# Patient Record
Sex: Male | Born: 1965
Health system: Southern US, Community
[De-identification: ages and names within clinical notes are randomized; demographics above are authoritative.]

## PROBLEM LIST (undated history)

## (undated) DIAGNOSIS — E119 Type 2 diabetes mellitus without complications: Secondary | ICD-10-CM

## (undated) DIAGNOSIS — F32A Depression, unspecified: Secondary | ICD-10-CM

## (undated) DIAGNOSIS — F319 Bipolar disorder, unspecified: Secondary | ICD-10-CM

## (undated) DIAGNOSIS — I509 Heart failure, unspecified: Secondary | ICD-10-CM

## (undated) DIAGNOSIS — I428 Other cardiomyopathies: Secondary | ICD-10-CM

## (undated) DIAGNOSIS — I10 Essential (primary) hypertension: Secondary | ICD-10-CM

## (undated) DIAGNOSIS — M1990 Unspecified osteoarthritis, unspecified site: Secondary | ICD-10-CM

## (undated) DIAGNOSIS — I219 Acute myocardial infarction, unspecified: Secondary | ICD-10-CM

## (undated) DIAGNOSIS — C801 Malignant (primary) neoplasm, unspecified: Secondary | ICD-10-CM

## (undated) HISTORY — PX: CHOLECYSTECTOMY: SHX55

## (undated) HISTORY — PX: HERNIA REPAIR: SHX51

---

## 2004-01-27 ENCOUNTER — Emergency Department (HOSPITAL_COMMUNITY): Admission: EM | Admit: 2004-01-27 | Discharge: 2004-01-27 | Payer: Self-pay | Admitting: Emergency Medicine

## 2004-04-11 ENCOUNTER — Inpatient Hospital Stay (HOSPITAL_COMMUNITY): Admission: EM | Admit: 2004-04-11 | Discharge: 2004-04-16 | Payer: Self-pay | Admitting: Psychiatry

## 2005-11-16 ENCOUNTER — Emergency Department (HOSPITAL_COMMUNITY): Admission: EM | Admit: 2005-11-16 | Discharge: 2005-11-17 | Payer: Self-pay | Admitting: Emergency Medicine

## 2006-06-26 ENCOUNTER — Emergency Department (HOSPITAL_COMMUNITY): Admission: EM | Admit: 2006-06-26 | Discharge: 2006-06-26 | Payer: Self-pay | Admitting: Emergency Medicine

## 2006-07-14 ENCOUNTER — Emergency Department (HOSPITAL_COMMUNITY): Admission: EM | Admit: 2006-07-14 | Discharge: 2006-07-15 | Payer: Self-pay | Admitting: Emergency Medicine

## 2006-07-20 ENCOUNTER — Emergency Department (HOSPITAL_COMMUNITY): Admission: EM | Admit: 2006-07-20 | Discharge: 2006-07-20 | Payer: Self-pay | Admitting: Emergency Medicine

## 2006-07-26 ENCOUNTER — Emergency Department (HOSPITAL_COMMUNITY): Admission: EM | Admit: 2006-07-26 | Discharge: 2006-07-26 | Payer: Self-pay | Admitting: Emergency Medicine

## 2006-07-30 ENCOUNTER — Emergency Department (HOSPITAL_COMMUNITY): Admission: EM | Admit: 2006-07-30 | Discharge: 2006-07-30 | Payer: Self-pay | Admitting: Emergency Medicine

## 2006-10-14 ENCOUNTER — Emergency Department (HOSPITAL_COMMUNITY): Admission: EM | Admit: 2006-10-14 | Discharge: 2006-10-14 | Payer: Self-pay | Admitting: Emergency Medicine

## 2007-01-05 ENCOUNTER — Emergency Department (HOSPITAL_COMMUNITY): Admission: EM | Admit: 2007-01-05 | Discharge: 2007-01-05 | Payer: Self-pay | Admitting: Emergency Medicine

## 2007-01-06 ENCOUNTER — Emergency Department (HOSPITAL_COMMUNITY): Admission: EM | Admit: 2007-01-06 | Discharge: 2007-01-06 | Payer: Self-pay | Admitting: Emergency Medicine

## 2007-06-02 ENCOUNTER — Emergency Department (HOSPITAL_COMMUNITY): Admission: EM | Admit: 2007-06-02 | Discharge: 2007-06-03 | Payer: Self-pay | Admitting: Emergency Medicine

## 2007-06-03 ENCOUNTER — Inpatient Hospital Stay (HOSPITAL_COMMUNITY): Admission: AD | Admit: 2007-06-03 | Discharge: 2007-06-09 | Payer: Self-pay | Admitting: Psychiatry

## 2007-06-03 ENCOUNTER — Ambulatory Visit: Payer: Self-pay | Admitting: *Deleted

## 2007-09-13 ENCOUNTER — Emergency Department (HOSPITAL_COMMUNITY): Admission: EM | Admit: 2007-09-13 | Discharge: 2007-09-13 | Payer: Self-pay | Admitting: Emergency Medicine

## 2008-08-17 ENCOUNTER — Emergency Department (HOSPITAL_COMMUNITY): Admission: EM | Admit: 2008-08-17 | Discharge: 2008-08-17 | Payer: Self-pay | Admitting: Family Medicine

## 2009-10-23 ENCOUNTER — Emergency Department (HOSPITAL_COMMUNITY): Admission: EM | Admit: 2009-10-23 | Discharge: 2009-10-23 | Payer: Self-pay | Admitting: Emergency Medicine

## 2009-10-24 ENCOUNTER — Other Ambulatory Visit: Payer: Self-pay

## 2009-10-24 ENCOUNTER — Inpatient Hospital Stay (HOSPITAL_COMMUNITY): Admission: AD | Admit: 2009-10-24 | Discharge: 2009-10-27 | Payer: Self-pay | Admitting: Psychiatry

## 2009-10-24 ENCOUNTER — Ambulatory Visit: Payer: Self-pay | Admitting: Psychiatry

## 2009-11-07 ENCOUNTER — Inpatient Hospital Stay (HOSPITAL_COMMUNITY): Admission: RE | Admit: 2009-11-07 | Discharge: 2009-11-12 | Payer: Self-pay | Admitting: Psychiatry

## 2009-11-07 ENCOUNTER — Other Ambulatory Visit: Payer: Self-pay | Admitting: Emergency Medicine

## 2010-06-29 ENCOUNTER — Ambulatory Visit: Payer: Self-pay | Admitting: Psychiatry

## 2010-06-30 ENCOUNTER — Inpatient Hospital Stay (HOSPITAL_COMMUNITY): Admission: EM | Admit: 2010-06-30 | Discharge: 2010-07-03 | Payer: Self-pay | Admitting: Psychiatry

## 2010-09-26 ENCOUNTER — Emergency Department (HOSPITAL_COMMUNITY): Admission: EM | Admit: 2010-09-26 | Discharge: 2010-09-26 | Payer: Self-pay | Admitting: Emergency Medicine

## 2010-10-13 ENCOUNTER — Emergency Department (HOSPITAL_COMMUNITY)
Admission: EM | Admit: 2010-10-13 | Discharge: 2010-10-13 | Payer: Self-pay | Source: Home / Self Care | Admitting: Emergency Medicine

## 2011-01-19 LAB — URINE CULTURE
Colony Count: NO GROWTH
Culture  Setup Time: 201112061414

## 2011-01-19 LAB — CBC
MCH: 27.9 pg (ref 26.0–34.0)
MCHC: 34.5 g/dL (ref 30.0–36.0)
MCV: 80.9 fL (ref 78.0–100.0)
Platelets: 141 10*3/uL — ABNORMAL LOW (ref 150–400)
RDW: 16.7 % — ABNORMAL HIGH (ref 11.5–15.5)

## 2011-01-19 LAB — COMPREHENSIVE METABOLIC PANEL
Albumin: 3.5 g/dL (ref 3.5–5.2)
BUN: 12 mg/dL (ref 6–23)
Calcium: 9.2 mg/dL (ref 8.4–10.5)
Creatinine, Ser: 0.98 mg/dL (ref 0.4–1.5)
Total Bilirubin: 1.1 mg/dL (ref 0.3–1.2)
Total Protein: 6.5 g/dL (ref 6.0–8.3)

## 2011-01-19 LAB — URINALYSIS, ROUTINE W REFLEX MICROSCOPIC
Glucose, UA: NEGATIVE mg/dL
Hgb urine dipstick: NEGATIVE
pH: 7.5 (ref 5.0–8.0)

## 2011-01-19 LAB — DIFFERENTIAL
Basophils Absolute: 0 10*3/uL (ref 0.0–0.1)
Lymphocytes Relative: 35 % (ref 12–46)
Lymphs Abs: 2.8 10*3/uL (ref 0.7–4.0)
Monocytes Absolute: 0.6 10*3/uL (ref 0.1–1.0)
Monocytes Relative: 8 % (ref 3–12)
Neutro Abs: 4.5 10*3/uL (ref 1.7–7.7)

## 2011-01-22 LAB — COMPREHENSIVE METABOLIC PANEL
ALT: 35 U/L (ref 0–53)
AST: 26 U/L (ref 0–37)
CO2: 29 mEq/L (ref 19–32)
Chloride: 103 mEq/L (ref 96–112)
GFR calc Af Amer: >60 mL/min (ref >60)
GFR calc non Af Amer: 59 mL/min — ABNORMAL LOW (ref >60)
Sodium: 142 mEq/L (ref 135–145)
Total Bilirubin: 0.3 mg/dL (ref 0.3–1.2)

## 2011-01-22 LAB — BENZODIAZEPINE, QUANTITATIVE, URINE
Lorazepam UR QT: NEGATIVE NG/ML
Oxazepam GC/MS Conf: 5330 NG/ML — ABNORMAL HIGH
Temazepam GC/MS Conf: 1996 NG/ML — ABNORMAL HIGH

## 2011-01-22 LAB — DRUGS OF ABUSE SCREEN W/O ALC, ROUTINE URINE
Amphetamine Screen, Ur: NEGATIVE
Benzodiazepines.: POSITIVE — AB
Cocaine Metabolites: POSITIVE — AB
Opiate Screen, Urine: NEGATIVE
Propoxyphene: NEGATIVE

## 2011-01-22 LAB — URINE MICROSCOPIC-ADD ON

## 2011-01-22 LAB — CBC
Hemoglobin: 15.9 g/dL (ref 13.0–17.0)
RBC: 5.61 MIL/uL (ref 4.22–5.81)
WBC: 9 10*3/uL (ref 4.0–10.5)

## 2011-01-22 LAB — URINALYSIS, ROUTINE W REFLEX MICROSCOPIC
Hgb urine dipstick: NEGATIVE
Specific Gravity, Urine: 1.029 (ref 1.005–1.030)
Urobilinogen, UA: 1 mg/dL (ref 0.0–1.0)

## 2011-02-08 LAB — COMPREHENSIVE METABOLIC PANEL
ALT: 17 U/L (ref 0–53)
AST: 19 U/L (ref 0–37)
Alkaline Phosphatase: 69 U/L (ref 39–117)
Alkaline Phosphatase: 91 U/L (ref 39–117)
BUN: 13 mg/dL (ref 6–23)
CO2: 28 mEq/L (ref 19–32)
GFR calc Af Amer: 60 mL/min — ABNORMAL LOW (ref >60)
GFR calc non Af Amer: >60 mL/min (ref >60)
Glucose, Bld: 131 mg/dL — ABNORMAL HIGH (ref 70–99)
Glucose, Bld: 77 mg/dL (ref 70–99)
Potassium: 3.9 mEq/L (ref 3.5–5.1)
Potassium: 3.9 mEq/L (ref 3.5–5.1)
Sodium: 139 mEq/L (ref 135–145)
Total Bilirubin: 0.7 mg/dL (ref 0.3–1.2)
Total Protein: 6.9 g/dL (ref 6.0–8.3)
Total Protein: 8.3 g/dL (ref 6.0–8.3)

## 2011-02-08 LAB — RAPID URINE DRUG SCREEN, HOSP PERFORMED
Amphetamines: NOT DETECTED
Amphetamines: NOT DETECTED
Amphetamines: NOT DETECTED
Barbiturates: NOT DETECTED
Barbiturates: NOT DETECTED
Benzodiazepines: NOT DETECTED
Cocaine: POSITIVE — AB
Cocaine: POSITIVE — AB
Opiates: POSITIVE — AB
Tetrahydrocannabinol: NOT DETECTED
Tetrahydrocannabinol: NOT DETECTED

## 2011-02-08 LAB — DIFFERENTIAL
Basophils Absolute: 0.1 10*3/uL (ref 0.0–0.1)
Basophils Relative: 1 % (ref 0–1)
Basophils Relative: 1 % (ref 0–1)
Eosinophils Absolute: 0.1 10*3/uL (ref 0.0–0.7)
Eosinophils Relative: 1 % (ref 0–5)
Eosinophils Relative: 2 % (ref 0–5)
Lymphs Abs: 2.1 10*3/uL (ref 0.7–4.0)
Lymphs Abs: 2.7 10*3/uL (ref 0.7–4.0)
Monocytes Absolute: 0.4 10*3/uL (ref 0.1–1.0)
Monocytes Absolute: 0.6 10*3/uL (ref 0.1–1.0)
Monocytes Relative: 5 % (ref 3–12)
Monocytes Relative: 6 % (ref 3–12)
Neutro Abs: 6.3 10*3/uL (ref 1.7–7.7)
Neutro Abs: 6.5 10*3/uL (ref 1.7–7.7)
Neutrophils Relative %: 68 % (ref 43–77)
Neutrophils Relative %: 69 % (ref 43–77)

## 2011-02-08 LAB — CBC
HCT: 47.6 % (ref 39.0–52.0)
HCT: 50.7 % (ref 39.0–52.0)
Hemoglobin: 15 g/dL (ref 13.0–17.0)
Hemoglobin: 16.4 g/dL (ref 13.0–17.0)
MCHC: 32 g/dL (ref 30.0–36.0)
MCV: 82.8 fL (ref 78.0–100.0)
Platelets: 196 10*3/uL (ref 150–400)
RBC: 5.66 MIL/uL (ref 4.22–5.81)
RDW: 15.1 % (ref 11.5–15.5)
RDW: 15.5 % (ref 11.5–15.5)
RDW: 15.5 % (ref 11.5–15.5)

## 2011-02-08 LAB — BASIC METABOLIC PANEL
BUN: 11 mg/dL (ref 6–23)
CO2: 28 mEq/L (ref 19–32)
Chloride: 106 mEq/L (ref 96–112)
Creatinine, Ser: 1.44 mg/dL (ref 0.4–1.5)
Glucose, Bld: 114 mg/dL — ABNORMAL HIGH (ref 70–99)
Potassium: 3.9 mEq/L (ref 3.5–5.1)

## 2011-02-08 LAB — ETHANOL
Alcohol, Ethyl (B): <5 mg/dL (ref 0–10)
Alcohol, Ethyl (B): <5 mg/dL (ref 0–10)

## 2011-03-23 NOTE — Discharge Summary (Signed)
Charles [name], Charles [name] NO.:  [id number]   MEDICAL RECORD NO.:  [id number]          PATIENT TYPE:  IPS   LOCATION:  0306                          FACILITY:  BH   PHYSICIAN:  Jasmine Pang, M.D. DATE OF BIRTH:  Dec 04, 1965   DATE OF ADMISSION:  06/03/2007  DATE OF DISCHARGE:  06/09/2007                               DISCHARGE SUMMARY   IDENTIFICATION:  The patient is a 45 year old single African American  male admitted on a voluntary basis on 06/03/2007.  The patient admitted  to being depressed and felt like he needed treatment.  He has been using  crack cocaine and alcohol and tried to get detoxed and med adjustments  at [facility].  He sees a physician at [facility] mental  health center.  The patient has not been taking his Risperdal at bedtime  as prescribed.  He was having commands for suicide.  This is the first  [facility] admission for this patient.  He has had prior  admission to [facility] and ? [facility].  He denies any medical problems.  No medications.   ALLERGIES:  He is allergic to PENICILLIN and MOTRIN.   ADMISSION LABORATORIES:  A hepatic profile revealed a decreased total  protein of 5.9, decreased albumin of 3.3, elevated AST of 65 (zero to  37), an elevated ALT of 74  (zero to 53).  Lipid profile was within  normal limits.  The rest of his labs were done in the ED prior to  admission and reviewed by the ED physician.  Alcohol level was 7.  UDS  was positive for cocaine.  Urinalysis was within normal limits.   HOSPITAL COURSE:  Upon admission the patient was started on Ambien 5 mg  p.o. q.h.s. p.r.n. insomnia, may repeat times one.  He was started on  Librium 25 mg p.o. q.6 h p.r.n. symptoms of anxiety or withdrawal.  He  was started on Risperdal 0.5 mg M tabs q.6 h for psychosis or agitation.  On 06/04/2007 the patient was started on Symmetrel 100 mg p.o. q.a.m.  and h.s.   He was also started on Risperdal M tabs 2 mg p.o. q.h.s.  He  was started on benztropine 2 mg p.o. q.8 h p.r.n. EPS and akathisia and  benztropine 1 mg p.o. q.h.s.  On 06/07/2007 the patient's Risperdal was  discontinued.  He did not feel this was effective.  He states he had  used it before and would like a different medication.  He was started on  Zyprexa 10 mg p.o. q.h.s.  The patient tolerated his medications well  with no significant side effects.  At discharge the Cogentin was  discontinued since it was felt he would not need this for the Zyprexa.  Patient's mood was depressed and angry.  He had positive suicidal  ideation, positive auditory hallucinations background voices.  He was  having positive visual hallucinations seeing ants.  He stated he wanted  long-term therapy.  His sleep was poor.  As hospitalization progressed,  his mental status improved somewhat.  He began to talk about being at  [facility] in [place] in the past.  He liked being there, stated he  relapsed when he came back to [place] because of bad associations.  The patient's mental status continued to improve.  On 06/08/2007 his  mood was mellow.  Affect consistent with mood.  No suicidal or  homicidal ideation.  No auditory or visual hallucinations.  It was  planned that he would be discharged the following day.  He was calling  [facility] for a place to stay after discharge.  The patient was  participating appropriately in unit therapeutic groups and activities.  On 06/09/2007 mental status had improved markedly from admission status.  He was friendly and cooperative with good eye contact.  Speech was  normal rate and flow.  Psychomotor activity was within normal limits.  Mood was euthymic.  Affect wide range.  No suicidal or homicidal  ideation.  No thoughts of self injurious behavior.  No auditory or  visual hallucinations.  No paranoia or delusions.  Thoughts were logical  and goal-directed.  Thought  content no predominant theme.  Cognitive was  grossly back to baseline.  It was felt the patient was safe to be  discharged today and he was going to go to an [address] in  [place].  He decided on an [address] in [place] instead of old  long-term rehab treatment.  He planned to go to AA and NA meetings.   DISCHARGE DIAGNOSES:  AXIS I:  Psychotic disorder not otherwise  specified, cocaine dependence.  AXIS II: None  AXIS III: None.  AXIS IV: Severe (problems with primary support group, problems related  to social environment.  Housing problem, economic problem.  Other  psychosocial problems, burden of psychiatric and chemical dependence  illness.)  AXIS V: GAF upon discharge was 50.  GAF upon admission was 28.  GAF  highest past year was 55-60.   DISCHARGE/PLAN:  There were no specific activity level or dietary  restrictions.  The patient will be followed up for chemical dependence  treatment and this will be arranged by our case manager.   DISCHARGE MEDICATIONS:  1. Amantadine 100 mg p.o. q.a.m. and at bedtime  2. Zyprexa 10 mg at bedtime  3. Trazodone 50 mg at bedtime.      Jasmine Pang, M.D.  Electronically Signed     BHS/MEDQ  D:  06/09/2007  T:  06/09/2007  Job:  [id number]

## 2011-03-26 NOTE — Discharge Summary (Signed)
Charles [name], STALLBAUMER NO.:  [id number]   MEDICAL RECORD NO.:  [id number]                   PATIENT TYPE:  IPS   LOCATION:  0301                                 FACILITY:  BH   PHYSICIAN:  Jeanice Lim, M.D.              DATE OF BIRTH:  [birth year]   DATE OF ADMISSION:  04/11/2004  DATE OF DISCHARGE:  04/16/2004                                 DISCHARGE SUMMARY   IDENTIFYING DATA:  This is a 45 year old African-American male, single,  voluntarily admitted.  He was brought to the emergency room having thoughts  of wanting to kill himself, wanting to jump from a building or bridge.  Sister took a knife away from him on the day of admission.  He told his  girlfriend the next time she saw him, it would be at his funeral.  He was  discharged from [facility] on May 31.  He relapsed on alcohol, homeless,  discouraged, could not get a job or into long-term treatment.  Cocaine in  remission for three years.  Longest period of sobriety was four years until  two weeks ago.   MEDICATIONS:  Zoloft and Zyprexa; none in several weeks.   DRUG ALLERGIES:  PENICILLIN causes a rash, MOTRIN causes ulcers.   PHYSICAL EXAMINATION:  NEUROLOGIC:  Within normal limits.   LABORATORY DATA:  Routine admission labs:  Essentially within normal limits.   MENTAL STATUS EXAM:  Fully alert, no psychomotor abnormalities, no tremor,  poor eye contact, guarded.  Speech: Mumbled, soft tone, decreased  productivity.  Mood: Guarded, depressed, hopeless, ashamed.  Thought  process: Positive goal directed and suicidal ideation with various plans,  some thought agitation.  Cognitive: Intact.  Judgment and insight: Limited.   ADMISSION DIAGNOSES:   AXIS I:  1. Major depressive disorder, recurrent, severe with psychotic features.  2. Alcohol abuse.  3. Cocaine abuse in remission.   AXIS II:  Deferred.   AXIS III:  1. Otitis externa.  2. History of seizures.   AXIS IV:  Severe problems  with homelessness, limited support system,  occupational problems, financial problems, other psychosocial stressors.   AXIS V:  24/56   HOSPITAL COURSE:  The patient was admitted, ordered routine p.r.n.  medications, underwent further monitoring, and was encouraged to participate  in individual, group, and milieu therapy.  The patient was placed on safety  checks and Librium detoxification protocol.  Zoloft was started targeting  depressive symptoms.  Zyprexa was resumed targeting psychotic symptoms.  The  patient had a past response to these two medicines.  The patient was  monitored for withdrawal symptoms, had mild tremor; otherwise, tolerated  detoxification without complications.  He reported improvement in mood.  The  patient reported resolution of withdrawal symptoms, mood improved.  He  reported that some voices but this was mumbling, was unable to make out.  No  command hallucinations, no  dangerous ideation.  The patient was discharged  in improved condition with psychotic symptoms resolving, more hopeful,  future oriented, increased judgment and insight.   CONDITION ON DISCHARGE:  The patient was discharged again in improved  condition with no dangerous ideation or psychotic symptoms.  He was given  medication education.   DISCHARGE MEDICATIONS:  1. Zoloft 50 mg q.a.m., given samples.  2. Zyprexa 5 mg one q.h.s., also given samples.   FOLLOW UP:  The patient was to follow up at [facility] and was to  call when he got to the bus station to be picked up.  Followup was scheduled  for June 9 at 3:00.   DISCHARGE DIAGNOSES:   AXIS I:  1. Major depressive disorder, recurrent, severe with psychotic features.  2. Alcohol abuse.  3. Cocaine abuse in remission.   AXIS II:  Deferred.   AXIS III:  1. Otitis externa.  2. History of seizures.   AXIS IV:  Severe problems with homelessness, limited support system,  occupational problems, financial problems, other  psychosocial stressors.   AXIS V:  Global assessment of functioning on discharge was 55.                                               Jeanice Lim, M.D.    JEM/MEDQ  D:  05/03/2004  T:  05/03/2004  Job:  [id number]

## 2011-04-09 ENCOUNTER — Emergency Department (HOSPITAL_COMMUNITY)
Admission: EM | Admit: 2011-04-09 | Discharge: 2011-04-10 | Disposition: A | Discharge status: Other Acute Inpatient Hospital | Payer: Medicaid Other | Attending: Emergency Medicine | Admitting: Emergency Medicine

## 2011-04-09 DIAGNOSIS — F1910 Other psychoactive substance abuse, uncomplicated: Secondary | ICD-10-CM | POA: Insufficient documentation

## 2011-04-09 DIAGNOSIS — R45851 Suicidal ideations: Secondary | ICD-10-CM | POA: Insufficient documentation

## 2011-04-09 DIAGNOSIS — F329 Major depressive disorder, single episode, unspecified: Secondary | ICD-10-CM | POA: Insufficient documentation

## 2011-04-09 DIAGNOSIS — Z79899 Other long term (current) drug therapy: Secondary | ICD-10-CM | POA: Insufficient documentation

## 2011-04-09 DIAGNOSIS — F3289 Other specified depressive episodes: Secondary | ICD-10-CM | POA: Insufficient documentation

## 2011-04-09 LAB — COMPREHENSIVE METABOLIC PANEL
Alkaline Phosphatase: 92 U/L (ref 39–117)
BUN: 10 mg/dL (ref 6–23)
CO2: 25 mEq/L (ref 19–32)
Chloride: 100 mEq/L (ref 96–112)
Creatinine, Ser: 0.98 mg/dL (ref 0.4–1.5)
GFR calc non Af Amer: >60 mL/min (ref >60)
Glucose, Bld: 107 mg/dL — ABNORMAL HIGH (ref 70–99)
Total Bilirubin: 0.5 mg/dL (ref 0.3–1.2)

## 2011-04-09 LAB — CBC
HCT: 43 % (ref 39.0–52.0)
MCHC: 34 g/dL (ref 30.0–36.0)
Platelets: 164 10*3/uL (ref 150–400)
RDW: 16 % — ABNORMAL HIGH (ref 11.5–15.5)
WBC: 10.8 10*3/uL — ABNORMAL HIGH (ref 4.0–10.5)

## 2011-04-09 LAB — DIFFERENTIAL
Basophils Absolute: 0 10*3/uL (ref 0.0–0.1)
Basophils Relative: 0 % (ref 0–1)
Eosinophils Absolute: 0.1 10*3/uL (ref 0.0–0.7)
Neutro Abs: 7.6 10*3/uL (ref 1.7–7.7)
Neutrophils Relative %: 70 % (ref 43–77)

## 2011-04-09 LAB — RAPID URINE DRUG SCREEN, HOSP PERFORMED
Barbiturates: NOT DETECTED
Cocaine: POSITIVE — AB
Opiates: NOT DETECTED

## 2011-04-10 ENCOUNTER — Inpatient Hospital Stay (HOSPITAL_COMMUNITY)
Admission: AD | Admit: 2011-04-10 | Discharge: 2011-04-14 | DRG: 885 | Disposition: A | Discharge status: Home / Self Care | Payer: Medicaid Other | Source: Ambulatory Visit | Attending: Psychiatry | Admitting: Psychiatry

## 2011-04-10 DIAGNOSIS — F1420 Cocaine dependence, uncomplicated: Secondary | ICD-10-CM

## 2011-04-10 DIAGNOSIS — Z5900 Homelessness unspecified: Secondary | ICD-10-CM

## 2011-04-10 DIAGNOSIS — F201 Disorganized schizophrenia: Secondary | ICD-10-CM

## 2011-04-10 DIAGNOSIS — R45851 Suicidal ideations: Secondary | ICD-10-CM

## 2011-04-10 DIAGNOSIS — F1010 Alcohol abuse, uncomplicated: Secondary | ICD-10-CM

## 2011-04-10 DIAGNOSIS — F29 Unspecified psychosis not due to a substance or known physiological condition: Principal | ICD-10-CM

## 2011-04-10 DIAGNOSIS — Z818 Family history of other mental and behavioral disorders: Secondary | ICD-10-CM

## 2011-04-10 DIAGNOSIS — F1920 Other psychoactive substance dependence, uncomplicated: Secondary | ICD-10-CM

## 2011-04-14 LAB — LIPID PANEL
Cholesterol: 228 mg/dL — ABNORMAL HIGH (ref 0–200)
Triglycerides: 250 mg/dL — ABNORMAL HIGH (ref <150)
VLDL: 50 mg/dL — ABNORMAL HIGH (ref 0–40)

## 2011-04-14 NOTE — Discharge Summary (Signed)
  Charles [name], MCQUARRIE NO.:  [id number]  MEDICAL RECORD NO.:  [id number]  LOCATION:  0306                          FACILITY:  BH  PHYSICIAN:  Franchot Gallo, MD     DATE OF BIRTH:  1966-02-22  DATE OF ADMISSION:  04/10/2011 DATE OF DISCHARGE:                              DISCHARGE SUMMARY   This is a 45 year old male that was admitted with suicidal thoughts in the setting of cocaine use, using crack cocaine all week, as well as alcohol.  His final diagnosis AXIS I: Psychosis NOS, rule out schizophrenia, disorganized type, cocaine dependence, alcohol abuse. AXIS II: No diagnosis. AXIS III: No diagnosis. AXIS IV: The patient is homeless and chronic substance use and chronic psychiatric illness. AXIS V: 50-55.  PERTINENT LABS:  Urine drug screen positive for cocaine.  CBC is within normal limits.  Alcohol level less than 11.  SIGNIFICANT FINDINGS:  This is a alert male in full contact with reality, cooperative, candid about his history.  Speech was nonpressured and did not appear to be responding to internal stimuli.  We continued with his Depakote and validated his antipsychotic injection.  He was participating in groups.  We had contact with his significant other to assess safety issues and provide information.  He was improving and in group and cooperative.  Felt that he was benefiting from getting back on his medications.  On day of discharge the patient was a fully alert, his sleep was good.  His appetite was good.  He denied any psychotic symptoms.  He denied any suicidal thoughts or homicidal thoughts, having no medication side effects.  He was stable for discharge with [facility] an assisted-living facility coming to pick up the patient.  DISCHARGE MEDICATIONS: 1. Zoloft 50 mg daily. 2. Cogentin 1 mg daily. 3. Depakote ER 3 tablets at bedtime. 4. Vitamin B12 one daily.  He was provided with a small supply of his medications as well  as prescriptions.  His follow-up appointment will be at [facility] on May 04, 2011 and to be picked up at the facility by [facility].     Landry Corporal, N.P.   ______________________________ Franchot Gallo, MD    JO/MEDQ  D:  04/14/2011  T:  04/14/2011  Job:  [id number]  Electronically Signed by Limmie PatriciaP. on 04/14/2011 05:14:13 PM Electronically Signed by Franchot Gallo MD on 04/14/2011 05:21:29 PM

## 2011-04-17 NOTE — H&P (Signed)
NAMETIGHE, GITTO NO.:  [id number]  MEDICAL RECORD NO.:  [id number]           PATIENT TYPE:  I  LOCATION:  0300                          FACILITY:  BH  PHYSICIAN:  Eulogio Ditch, MD DATE OF BIRTH:  1966-08-23  DATE OF ADMISSION:  04/09/2011 DATE OF DISCHARGE:                      PSYCHIATRIC ADMISSION ASSESSMENT   IDENTIFICATION:  A 45 year old African American male.  This is a voluntary admission.  HISTORY OF THE PRESENT ILLNESS:  This one of several [facility] admissions for Charles [name] who was most recently on our adult unit in September of 2011.  On this occasion, he presents by way of our emergency room with suicidal thoughts in the setting of cocaine abuse.  He had been living at a rehab house and started using cocaine this past week and was evicted yesterday from the rehab house after 60 days of sobriety.  He has been using cocaine all week, along with some alcohol.  His relapse triggers are unclear but he at least partially attributes it to partying in anticipation of his birthday which was yesterday, [birth year].  He reports that while sober for 60 days, his auditory hallucinations were under good control.  The cocaine makes auditory hallucinations worse.  He was saying he wanted to overdose on his medications.  He denies any suicidal thoughts today.  PAST PSYCHIATRIC HISTORY:  Charles [name] has a history of chronic mental illness and cocaine dependence and is currently followed as an outpatient at [facility] mental health services.  He reports he continues to take his once-monthly injection, and medication is unclear but in the past, he has taken Invega injections 117 mg once monthly.  He has reported compliance with his other medications up until the time he began using cocaine this week.  He has a history of chronic psychotic disorder.  SOCIAL HISTORY:  This is a single [place] American male, currently unemployed.  He has a GED and 2 years of college.  He has  been married twice and is currently separated and reports that he receives an SSI check for schizophrenia.  FAMILY HISTORY:  Mother with unspecified mental illness.  ALCOHOL AND DRUG HISTORY:  History of cocaine dependence with intermittent alcohol use.  MEDICAL HISTORY:  No regular primary care provider is known.  MEDICAL PROBLEMS:  None.  PAST MEDICAL HISTORY:  Occasional chronic back pain with no history of surgery.  CURRENT MEDICATIONS: 1. Depakote 1500 mg q.h.s. 2. Monthly antipsychotic injection, medication unknown. 3. Benztropine 1 mg 1 tablet q.h.s. 4. Vitamin B12 one tablet daily. 5. Visine eye drops as needed.  DRUG ALLERGIES:  IBUPROFEN and PENICILLIN.  MENTAL STATUS EXAM:  Fully alert male who is rather drowsy this morning but is up.  In full contact with reality, alert, fully oriented, cooperative, candid about his history.  Speech non pressured.  Does not appear to be internally distracted.  Cooperative.  Socializing appropriately in the milieu.  Denying suicidal thoughts.  Working and remote memory are intact.  Insight partial.  Impulse control and judgment fair.  Axis I:  Psychosis not otherwise specified, rule out schizophrenia, disorganized type; cocaine dependence; alcohol abuse.  Axis II:  No diagnosis. Axis III:  No diagnosis. Axis IV:  Homeless. Axis V:  Current 40.  Past year not known.  PLAN:  Voluntarily admit him to our detox unit.  We are continuing his Depakote at night and we will get validation on his once-monthly antipsychotic injection and see what help we can give him with local resources.     Margaret A. Lorin Picket, N.P.   ______________________________ Eulogio Ditch, MD    MAS/MEDQ  D:  04/10/2011  T:  04/10/2011  Job:  [id number]  Electronically Signed by Kari Baars N.P. on 04/12/2011 08:14:41 AM Electronically Signed by Eulogio Ditch  on 04/17/2011 05:58:32 PM

## 2011-08-17 LAB — COMPREHENSIVE METABOLIC PANEL
AST: 43 — ABNORMAL HIGH
Albumin: 3.7
BUN: 8
Creatinine, Ser: 0.99
GFR calc Af Amer: >60
Total Protein: 6.5

## 2011-08-17 LAB — CBC
HCT: 41.5
MCV: 80.9
Platelets: 148 — ABNORMAL LOW
RDW: 15.8 — ABNORMAL HIGH
WBC: 9.2

## 2011-08-17 LAB — DIFFERENTIAL
Basophils Absolute: 0.1
Eosinophils Relative: 2
Lymphocytes Relative: 34
Lymphs Abs: 3.1
Monocytes Absolute: 0.6
Monocytes Relative: 6
Neutro Abs: 5.3

## 2011-08-17 LAB — ETHANOL: Alcohol, Ethyl (B): <5

## 2011-08-17 LAB — RAPID URINE DRUG SCREEN, HOSP PERFORMED
Amphetamines: NOT DETECTED
Barbiturates: NOT DETECTED
Benzodiazepines: NOT DETECTED
Cocaine: NOT DETECTED

## 2011-08-23 LAB — URINALYSIS, ROUTINE W REFLEX MICROSCOPIC
Bilirubin Urine: NEGATIVE
Nitrite: NEGATIVE
Specific Gravity, Urine: 1.034 — ABNORMAL HIGH
Urobilinogen, UA: 1

## 2011-08-23 LAB — CBC
Hemoglobin: 16.2
RBC: 5.99 — ABNORMAL HIGH

## 2011-08-23 LAB — LIPID PANEL
LDL Cholesterol: 65
Total CHOL/HDL Ratio: 2.6
VLDL: 28

## 2011-08-23 LAB — BASIC METABOLIC PANEL
CO2: 29
Calcium: 9.3
GFR calc Af Amer: >60
GFR calc non Af Amer: >60
Potassium: 3.7
Sodium: 140

## 2011-08-23 LAB — DIFFERENTIAL
Basophils Relative: 0
Eosinophils Relative: 1
Lymphocytes Relative: 20
Neutrophils Relative %: 75

## 2011-08-23 LAB — RAPID URINE DRUG SCREEN, HOSP PERFORMED
Opiates: NOT DETECTED
Tetrahydrocannabinol: NOT DETECTED

## 2011-08-23 LAB — HEPATIC FUNCTION PANEL
ALT: 74 — ABNORMAL HIGH
AST: 65 — ABNORMAL HIGH
Alkaline Phosphatase: 57
Bilirubin, Direct: 0.1
Total Bilirubin: 0.6

## 2011-08-23 LAB — TRICYCLICS SCREEN, URINE: TCA Scrn: NOT DETECTED

## 2011-11-09 ENCOUNTER — Other Ambulatory Visit: Payer: Self-pay

## 2011-11-09 ENCOUNTER — Emergency Department (HOSPITAL_COMMUNITY)
Admission: EM | Admit: 2011-11-09 | Discharge: 2011-11-09 | Disposition: A | Discharge status: Other Acute Inpatient Hospital | Payer: Medicaid Other | Attending: Emergency Medicine | Admitting: Emergency Medicine

## 2011-11-09 ENCOUNTER — Encounter: Payer: Self-pay | Admitting: *Deleted

## 2011-11-09 DIAGNOSIS — Z91199 Patient's noncompliance with other medical treatment and regimen due to unspecified reason: Secondary | ICD-10-CM | POA: Insufficient documentation

## 2011-11-09 DIAGNOSIS — Z9114 Patient's other noncompliance with medication regimen: Secondary | ICD-10-CM

## 2011-11-09 DIAGNOSIS — R45851 Suicidal ideations: Secondary | ICD-10-CM | POA: Insufficient documentation

## 2011-11-09 DIAGNOSIS — Z9119 Patient's noncompliance with other medical treatment and regimen: Secondary | ICD-10-CM | POA: Insufficient documentation

## 2011-11-09 DIAGNOSIS — F319 Bipolar disorder, unspecified: Secondary | ICD-10-CM | POA: Insufficient documentation

## 2011-11-09 HISTORY — DX: Bipolar disorder, unspecified: F31.9

## 2011-11-09 LAB — URINALYSIS, ROUTINE W REFLEX MICROSCOPIC
Hgb urine dipstick: NEGATIVE
Leukocytes, UA: NEGATIVE
Nitrite: NEGATIVE
Specific Gravity, Urine: 1.036 — ABNORMAL HIGH (ref 1.005–1.030)
Urobilinogen, UA: 1 mg/dL (ref 0.0–1.0)

## 2011-11-09 LAB — RAPID URINE DRUG SCREEN, HOSP PERFORMED
Opiates: NOT DETECTED
Tetrahydrocannabinol: NOT DETECTED

## 2011-11-09 LAB — COMPREHENSIVE METABOLIC PANEL
AST: 22 U/L (ref 0–37)
BUN: 19 mg/dL (ref 6–23)
CO2: 33 mEq/L — ABNORMAL HIGH (ref 19–32)
Chloride: 101 mEq/L (ref 96–112)
Creatinine, Ser: 1.36 mg/dL — ABNORMAL HIGH (ref 0.50–1.35)
GFR calc non Af Amer: 61 mL/min — ABNORMAL LOW (ref >90)
Total Bilirubin: 0.3 mg/dL (ref 0.3–1.2)

## 2011-11-09 LAB — CBC
HCT: 45.6 % (ref 39.0–52.0)
Hemoglobin: 15.2 g/dL (ref 13.0–17.0)
MCH: 26.5 pg (ref 26.0–34.0)
MCV: 79.4 fL (ref 78.0–100.0)
RBC: 5.74 MIL/uL (ref 4.22–5.81)

## 2011-11-09 NOTE — ED Notes (Signed)
Pt from [facility] for med clearance then will return, IVC papers state he is a danger to self and other, gives history of $800.00 crack in last 3 days.

## 2011-11-09 NOTE — ED Notes (Signed)
Sent by [facility].  Told by staff there that pt has a bed there once pt is medically cleared.

## 2011-11-09 NOTE — ED Provider Notes (Signed)
History     CSN: [id number]  Arrival date & time 11/09/11  1350   First MD Initiated Contact with Patient 11/09/11 1508      Chief Complaint  Patient presents with  . V70.1    Med clearance for [facility]. [facility] has a bed.     The history is provided by the patient.   the patient was sent from [facility] today for medical clearance.  The patient reports suicidal and homicidal ideation without a significant plan.  He also reports long-standing history of cocaine abuse with recent several-day binge.  He reports noncompliance with his medications x4 months.  He is without chest pain or shortness of breath.  He denies urinary symptoms at this time.  He denies abdominal pain nausea vomiting and diarrhea.  He reports no EtOH abuse.  Past Medical History  Diagnosis Date  . Bipolar 1 disorder     History reviewed. No pertinent past surgical history.  No family history on file.  History  Substance Use Topics  . Smoking status: Not on file  . Smokeless tobacco: Not on file  . Alcohol Use:       Review of Systems  All other systems reviewed and are negative.    Allergies  Ibuprofen and Penicillins  Home Medications   Current Outpatient Rx  Name Route Sig Dispense Refill  . BENZTROPINE MESYLATE 1 MG PO TABS Oral Take 1 mg by mouth 2 (two) times daily.      Marland Kitchen DIVALPROEX SODIUM ER 500 MG PO TB24 Oral Take 1,500 mg by mouth daily.     Hinda Glatter SUSTENNA IM Intramuscular Inject 1 pen into the muscle every 30 (thirty) days.        BP 116/76  Pulse 90  Temp(Src) 98.1 F (36.7 C) (Oral)  Resp 18  SpO2 96%  Physical Exam  Nursing note and vitals reviewed. Constitutional: He is oriented to person, place, and time. He appears well-developed and well-nourished.  HENT:  Head: Normocephalic and atraumatic.  Eyes: EOM are normal.  Neck: Normal range of motion.  Cardiovascular: Normal rate, regular rhythm, normal heart sounds and intact distal pulses.   Pulmonary/Chest: Effort  normal and breath sounds normal. No respiratory distress.  Abdominal: Soft. He exhibits no distension. There is no tenderness.  Musculoskeletal: Normal range of motion.  Neurological: He is alert and oriented to person, place, and time.  Skin: Skin is warm and dry.  Psychiatric:       Suicidal and homicidal thoughts    ED Course  Procedures (including critical care time)   Date: 11/09/2011  Rate: 86  Rhythm: normal sinus rhythm  QRS Axis: normal  Intervals: normal  ST/T Wave abnormalities: normal  Conduction Disutrbances:none  Narrative Interpretation:   Old EKG Reviewed: No significant changes noted     Labs Reviewed  COMPREHENSIVE METABOLIC PANEL - Abnormal; Notable for the following:    CO2 33 (*)    Creatinine, Ser 1.36 (*)    GFR calc non Af Amer 61 (*)    GFR calc Af Amer 70 (*)    All other components within normal limits  URINE RAPID DRUG SCREEN (HOSP PERFORMED) - Abnormal; Notable for the following:    Cocaine POSITIVE (*)    All other components within normal limits  URINALYSIS, ROUTINE W REFLEX MICROSCOPIC - Abnormal; Notable for the following:    Specific Gravity, Urine 1.036 (*)    Bilirubin Urine SMALL (*)    Ketones, ur 15 (*)  All other components within normal limits  CBC  ETHANOL  ACETAMINOPHEN LEVEL   No results found.   No diagnosis found.    MDM  The patient is medically clear for transfer back to [facility] for placement for his behavioral issues.  The patient is with a shared at this time.        Lyanne Co, MD 11/09/11 [phone]

## 2011-11-09 NOTE — ED Notes (Signed)
Pt sent from [facility] for med clearance, IVC papers, states harm to self and others, pt calm and cooperative during assessment. [facility] security in with pt. Pt waiting for completion of lab work and requested test

## 2013-06-29 ENCOUNTER — Ambulatory Visit: Payer: Medicaid Other | Admitting: Endocrinology

## 2015-10-31 ENCOUNTER — Ambulatory Visit
Admission: RE | Admit: 2015-10-31 | Payer: Medicaid Other | Source: Ambulatory Visit | Admitting: Unknown Physician Specialty

## 2015-10-31 ENCOUNTER — Encounter: Admission: RE | Payer: Self-pay | Source: Ambulatory Visit

## 2015-10-31 SURGERY — FECAL MICROBIOTA TRANSFER
Anesthesia: General

## 2016-03-25 LAB — PSA, DIAGNOSTIC (PROSTATE SPECIFIC AG): Prostate Specific Ag: 2 ng/mL (ref 0.0–4.0)

## 2017-08-23 LAB — PSA, DIAGNOSTIC (PROSTATE SPECIFIC AG): Prostate Specific Ag: 5.41 ng/mL — AB (ref 0.00–4.00)

## 2017-09-20 ENCOUNTER — Encounter: Attending: Urology | Primary: Family Medicine

## 2017-10-11 ENCOUNTER — Encounter: Attending: Urology | Primary: Family Medicine

## 2017-10-13 ENCOUNTER — Encounter: Attending: Urology | Primary: Family Medicine

## 2017-10-13 ENCOUNTER — Ambulatory Visit
Admit: 2017-10-13 | Discharge: 2017-10-13 | Payer: PRIVATE HEALTH INSURANCE | Attending: Urology | Primary: Family Medicine

## 2017-10-13 DIAGNOSIS — R972 Elevated prostate specific antigen [PSA]: Secondary | ICD-10-CM

## 2017-10-13 NOTE — Progress Notes (Signed)
ASSESSMENT:   1. Elevated PSA    2. Enlarged prostate with urinary obstruction            PLAN:    ?? Will have repeat PSA today and will notify with results  ?? Continue on Flomax 0.4 mg         Chief Complaint   Patient presents with   ??? Elevated PSA       HISTORY OF PRESENT ILLNESS:  Dustin Edwards is a 51 y.o. male who is seen in consultation as referred by Dr. Jed Limerickavinder Mohan for elevated PSA. Most recent PSA was 5.410 on 08/23/2017 which increased from 2.0 on 03/25/2016. He is on Flomax 0.4 mg and reports mild urinary symptoms.     REVIEW OF LABS AND IMAGING:   PSA /TESTOSTERONE - [facility] PSA   Latest Ref Rng & Units 0.00 - 4.00 ng/mL   08/23/2017 5.410 (A)   03/25/2016 2.0     No flowsheet data found.      Past Medical History:   Diagnosis Date   ??? Anxiety    ??? Depression    ??? Gall stones    ??? HTN (hypertension)    ??? Hypothyroidism    ??? Seizures (HCC)        Past Surgical History:   Procedure Laterality Date   ??? HX CHOLECYSTECTOMY     ??? HX COLONOSCOPY     ??? HX FRACTURE TX     ??? HX GASTROSTOMY         Social History     Tobacco Use   ??? Smoking status: Never Smoker   ??? Smokeless tobacco: Never Used   Substance Use Topics   ??? Alcohol use: Not on file   ??? Drug use: Not on file       Allergies   Allergen Reactions   ??? Penicillins Hives and Itching   ??? Ibuprofen Other (comments)     States he cannot take r/t hx of bleeding ulcers       History reviewed. No pertinent family history.        Review of Systems  Constitutional: Fever: No  Skin: Rash: No  HEENT: Hearing difficulty: No  Eyes: Blurred vision: No  Cardiovascular: Chest pain: No  Respiratory: Shortness of breath: No  Gastrointestinal: Nausea/vomiting: No  Musculoskeletal: Back pain: No  Neurological: Weakness: No  Psychological: Memory loss: No  Comments/additional findings:         PHYSICAL EXAMINATION:   Visit Vitals  BP 134/70   Ht 5\' 8"  (1.727 m)   Wt 246 lb (111.6 kg)   BMI 37.40 kg/m??      Constitutional: WDWN, Pleasant and appropriate affect, No acute distress.  Alert and oriented x4.   CV:  No peripheral swelling noted  Respiratory: No respiratory distress or difficulties  Abdomen:  No abdominal masses or tenderness. No CVA tenderness. No inguinal hernias noted.   GU Male:    DRE: Deferred  Skin: No jaundice.    Neuro/Psych:  Alert and oriented x 3, affect appropriate.   Lymphatic:   No enlarged inguinal lymph nodes.      Patient's BMI is out of the normal parameters.  Information about BMI was given       A copy of today's office visit with all pertinent imaging results and labs were sent to the referring physician.        Laverda PageBruce A Khylen Riolo, MD     Documentation provided by Prince RomeAndrew Shaw Benotakeia, medical  scribe for Dustin Askew, MD

## 2017-10-13 NOTE — Addendum Note (Signed)
Addended by: Arnoldo HookerSPEIGHT, Hayli Milligan on: 10/17/2017 08:51 AM     Modules accepted: Orders

## 2017-10-13 NOTE — Progress Notes (Signed)
Dustin Edwards presents today for lab draw per Dr. Letitia Neriosenfeld order.    Patient will be notified with lab results.    PSA obtained via venipuncture without any difficulty.      Orders Placed This Encounter   ??? PROSTATE SPECIFIC AG (PSA)   ??? COLLECTION VENOUS BLOOD,VENIPUNCTURE   ??? gemfibrozil (LOPID) 600 mg tablet     Refill:  3   ??? hydroCHLOROthiazide (MICROZIDE) 12.5 mg capsule     Refill:  0   ??? levothyroxine (SYNTHROID) 50 mcg tablet     Refill:  0   ??? cyclobenzaprine (FLEXERIL) 10 mg tablet     Refill:  0   ??? sertraline (ZOLOFT) 100 mg tablet     Sig: 100 mg.   ??? tamsulosin (FLOMAX) 0.4 mg capsule     Refill:  3   ??? divalproex ER (DEPAKOTE ER) 500 mg ER tablet     Refill:  2       Dustin HookerSukera Efe Fazzino, LPN

## 2017-10-13 NOTE — Progress Notes (Signed)
Pt informed

## 2017-10-13 NOTE — Progress Notes (Signed)
Please inform PSA normal at 1.4

## 2017-10-17 ENCOUNTER — Encounter: Attending: Urology | Primary: Family Medicine

## 2017-10-18 LAB — PSA, DIAGNOSTIC (PROSTATE SPECIFIC AG): Prostate Specific Ag: 1.4 ng/mL (ref 0.0–4.0)

## 2018-05-09 ENCOUNTER — Encounter: Attending: Nurse Practitioner | Primary: Family Medicine

## 2018-05-09 NOTE — Progress Notes (Deleted)
ASSESSMENT:   No diagnosis found.        PLAN:    ?? Will have repeat PSA today and will notify with results  ?? Continue on Flomax 0.4 mg         No chief complaint on file.      HISTORY OF PRESENT ILLNESS:  Dustin Edwards is a 52 y.o. male who is seen in follow up for elevated psa and urinary frequency.     Previously seen in 10/2017 with Dr Marcelle Smilingosenfield for elevated PSA 5.410 on 08/23/2017 which increased from 2.0 on 03/25/2016. His most recent PSA 1.4 on 10/13/18     He is on Flomax 0.4 mg and reports mild urinary symptoms.     REVIEW OF LABS AND IMAGING:   PSA /TESTOSTERONE - [facility] PSA   Latest Ref Rng & Units 0.00 - 4.00 ng/mL   08/23/2017 5.410 (A)   03/25/2016 2.0     No flowsheet data found.      Past Medical History:   Diagnosis Date   ??? Anxiety    ??? Depression    ??? Elevated PSA    ??? Enlarged prostate with urinary obstruction    ??? Gall stones    ??? HTN (hypertension)    ??? Hypothyroidism    ??? Seizures (HCC)        Past Surgical History:   Procedure Laterality Date   ??? HX CHOLECYSTECTOMY     ??? HX COLONOSCOPY     ??? HX FRACTURE TX     ??? HX GASTROSTOMY         Social History     Tobacco Use   ??? Smoking status: Never Smoker   ??? Smokeless tobacco: Never Used   Substance Use Topics   ??? Alcohol use: Not on file   ??? Drug use: Not on file       Allergies   Allergen Reactions   ??? Penicillins Hives and Itching   ??? Ibuprofen Other (comments)     States he cannot take r/t hx of bleeding ulcers       No family history on file.        Review of Systems  Constitutional: Fever:   Skin: Rash:   HEENT: Hearing difficulty:   Eyes: Blurred vision:   Cardiovascular: Chest pain:   Respiratory: Shortness of breath:   Gastrointestinal: Nausea/vomiting:   Musculoskeletal: Back pain:   Neurological: Weakness:   Psychological: Memory loss:   Comments/additional findings:         PHYSICAL EXAMINATION:   There were no vitals taken for this visit.  Constitutional: WDWN, Pleasant and appropriate affect, No acute distress.   Alert and oriented x4.   CV:  No peripheral swelling noted  Respiratory: No respiratory distress or difficulties  Abdomen:  No abdominal masses or tenderness. No CVA tenderness. No inguinal hernias noted.   GU Male:    DRE: Deferred  Skin: No jaundice.    Neuro/Psych:  Alert and oriented x 3, affect appropriate.   Lymphatic:   No enlarged inguinal lymph nodes.      Patient's BMI is out of the normal parameters.  Information about BMI was given       A copy of today's office visit with all pertinent imaging results and labs were sent to the referring physician.        Moody BruinsMatthew Doaa Kendzierski FNP-C  [facility]  [address]  [phone]

## 2018-05-29 ENCOUNTER — Encounter: Attending: Nurse Practitioner | Primary: Family Medicine

## 2018-05-29 NOTE — Progress Notes (Deleted)
ASSESSMENT:   No diagnosis found.        PLAN:    ?? Will have repeat PSA today and will notify with results  ?? Continue on Flomax 0.4 mg         No chief complaint on file.      HISTORY OF PRESENT ILLNESS:  Dustin Edwards is a 52 y.o. male who is seen in follow up for elevated psa and urinary frequency.     Previously seen in 10/2017 with Dr Marcelle Smilingosenfield for elevated PSA 5.410 on 08/23/2017 which increased from 2.0 on 03/25/2016. His most recent PSA 1.4 on 10/13/18     He is on Flomax 0.4 mg and reports mild urinary symptoms.     REVIEW OF LABS AND IMAGING:   PSA /TESTOSTERONE - [facility]SI PSA   Latest Ref Rng & Units 0.00 - 4.00 ng/mL   08/23/2017 5.410 (A)   03/25/2016 2.0     No flowsheet data found.      Past Medical History:   Diagnosis Date   ??? Anxiety    ??? Depression    ??? Elevated PSA    ??? Enlarged prostate with urinary obstruction    ??? Gall stones    ??? HTN (hypertension)    ??? Hypothyroidism    ??? Seizures (HCC)        Past Surgical History:   Procedure Laterality Date   ??? HX CHOLECYSTECTOMY     ??? HX COLONOSCOPY     ??? HX FRACTURE TX     ??? HX GASTROSTOMY         Social History     Tobacco Use   ??? Smoking status: Never Smoker   ??? Smokeless tobacco: Never Used   Substance Use Topics   ??? Alcohol use: Not on file   ??? Drug use: Not on file       Allergies   Allergen Reactions   ??? Penicillins Hives and Itching   ??? Ibuprofen Other (comments)     States he cannot take r/t hx of bleeding ulcers       No family history on file.        Review of Systems  Constitutional: Fever:   Skin: Rash:   HEENT: Hearing difficulty:   Eyes: Blurred vision:   Cardiovascular: Chest pain:   Respiratory: Shortness of breath:   Gastrointestinal: Nausea/vomiting:   Musculoskeletal: Back pain:   Neurological: Weakness:   Psychological: Memory loss:   Comments/additional findings:         PHYSICAL EXAMINATION:   There were no vitals taken for this visit.  Constitutional: WDWN, Pleasant and appropriate affect, No acute distress.   Alert and oriented x4.   CV:  No peripheral swelling noted  Respiratory: No respiratory distress or difficulties  Abdomen:  No abdominal masses or tenderness. No CVA tenderness. No inguinal hernias noted.   GU Male:    DRE: Deferred  Skin: No jaundice.    Neuro/Psych:  Alert and oriented x 3, affect appropriate.   Lymphatic:   No enlarged inguinal lymph nodes.      Patient's BMI is out of the normal parameters.  Information about BMI was given       A copy of today's office visit with all pertinent imaging results and labs were sent to the referring physician.        Moody BruinsMatthew Jaylyne Breese FNP-C  [facility]  [address]  [phone]

## 2018-06-07 ENCOUNTER — Ambulatory Visit: Admit: 2018-06-07 | Discharge: 2018-06-07 | Attending: Nurse Practitioner | Primary: Family Medicine

## 2018-06-07 DIAGNOSIS — R350 Frequency of micturition: Secondary | ICD-10-CM

## 2018-06-07 LAB — AMB POC URINALYSIS DIP STICK AUTO W/O MICRO
Bilirubin (UA POC): NEGATIVE
Bilirubin, Urine, POC: NEGATIVE
Blood (UA POC): NEGATIVE
Blood (UA POC): NEGATIVE
Ketones (UA POC): NEGATIVE
Ketones, Urine, POC: NEGATIVE
Leukocyte Esterase, Urine, POC: NEGATIVE
Leukocyte esterase (UA POC): NEGATIVE
Nitrite, Urine, POC: NEGATIVE
Nitrites (UA POC): NEGATIVE
Protein (UA POC): NEGATIVE
Protein, Urine, POC: NEGATIVE
Specific Gravity, Urine, POC: 1.025 NA (ref 1.001–1.035)
Specific gravity (UA POC): 1.025 (ref 1.001–1.035)
Urobilinogen (UA POC): 1 (ref 0.2–1)
Urobilinogen, POC: 1 (ref 0.2–1)
pH (UA POC): 6 (ref 4.6–8.0)
pH, Urine, POC: 6 NA (ref 4.6–8.0)

## 2018-06-07 LAB — AMB POC PVR, MEAS,POST-VOID RES,US,NON-IMAGING
PVR POC: 0 cc
PVR: 0 cc

## 2018-06-07 MED ORDER — MIRABEGRON ER 50 MG TABLET,EXTENDED RELEASE 24 HR
50 mg | ORAL_TABLET | Freq: Every day | ORAL | 3 refills | Status: DC
Start: 2018-06-07 — End: 2018-08-29

## 2018-06-07 NOTE — Addendum Note (Signed)
Addended by: Oda KiltsBETANCOURT, Ramy Greth S on: 06/07/2018 12:46 PM     Modules accepted: Orders

## 2018-06-07 NOTE — Progress Notes (Signed)
ASSESSMENT:   1. Urinary frequency    2. Benign prostatic hyperplasia with lower urinary tract symptoms, symptom details unspecified    3. Urinary urgency    4. Urinary incontinence, unspecified type      BPH/LUTS. PVR 0cc. AUA sx score 19   Urinary frequency, urgency, UUI using 3 diapers daily. No benefit with flomax.          PLAN:    ?? Patient very bothered by urinary symptoms. Currently reports frequency, urgency, UUI, wears 3-4 diapers daily   ?? Continue on Flomax 0.4 mg  ?? Add Myrbetriq 50mg  daily   ?? Schedule UDS  ?? Follow up for cystoscopy and TRUS to consider outlet procedure      Chief Complaint   Patient presents with   ??? Benign Prostatic Hypertrophy     EP is being seen for Urinary Frequency. The patient states that if the restroom is not reached immediately leakage may occur, which occurs moreso in the evening hours while asleep.  FOS is steady though some post void dribble is present.     ??? Other     HCTZ taken in AM.        HISTORY OF PRESENT ILLNESS:  Dustin Edwards is a 52 y.o. male who is seen in follow up for elevated psa and urinary frequency.     Previously seen in 10/2017 with Dr Marcelle Smiling for elevated PSA 5.410 on 08/23/2017 which increased from 2.0 on 03/25/2016. His most recent PSA 1.4 on 10/13/18    Patient presents today (06/07/18) with reports of bothersome LUTS.   He is currently taking flomax 0.4mg    Patient reports bothersome urinary frequency and urgency. He notes the need to urinate q30 minutes  He reprots bothersome urinary urgency with UUI x2 daily. He does wear briefs. Uses 3-4x briefs  He reports good FOS. He reports occasional hesitancy. Denies intermittency  Denies dysuria or hematuria. Noted 1 episode dysuria a few months ago   He drinks 1 cup coffee daily, 1 sparkling soda daily, and about 20oz water daily    A1c 6.3 on 03/03/18     He does take HCTZ in the AM       REVIEW OF LABS AND IMAGING:   PSA /TESTOSTERONE - [facility]SI PSA   Latest Ref Rng & Units 0.00 - 4.00 ng/mL    08/23/2017 5.410 (A)   03/25/2016 2.0     No flowsheet data found.      Past Medical History:   Diagnosis Date   ??? Anxiety    ??? Depression    ??? Elevated PSA    ??? Enlarged prostate with urinary obstruction    ??? Gall stones    ??? HTN (hypertension)    ??? Hypothyroidism    ??? Seizures (HCC)    ??? Urinary frequency        Past Surgical History:   Procedure Laterality Date   ??? HX CHOLECYSTECTOMY     ??? HX COLONOSCOPY     ??? HX FRACTURE TX     ??? HX GASTROSTOMY         Social History     Tobacco Use   ??? Smoking status: Never Smoker   ??? Smokeless tobacco: Never Used   Substance Use Topics   ??? Alcohol use: Never     Frequency: Never   ??? Drug use: Never       Allergies   Allergen Reactions   ??? Penicillins Hives and Itching   ???  Ibuprofen Other (comments)     States he cannot take r/t hx of bleeding ulcers       History reviewed. No pertinent family history.        Review of Systems  Constitutional: Fever: No  Skin: Rash: No  HEENT: Hearing difficulty: No  Eyes: Blurred vision: No  Cardiovascular: Chest pain: No  Respiratory: Shortness of breath: No  Gastrointestinal: Nausea/vomiting: No  Musculoskeletal: Back pain: No  Neurological: Weakness: No  Psychological: Memory loss: No  Comments/additional findings:           PHYSICAL EXAMINATION:   Visit Vitals  BP 130/80   Ht 5\' 8"  (1.727 m)   Wt 261 lb (118.4 kg)   BMI 39.68 kg/m??     Constitutional: WDWN, Pleasant and appropriate affect, No acute distress.    CV:  No peripheral swelling noted  Respiratory: No respiratory distress or difficulties  Abdomen:  No abdominal masses or tenderness. No CVA tenderness. No inguinal hernias noted.   GU Male:  06/07/18  UEA:[id number]:Perineum normal to visual inspection, no erythema or irritation, Sphincter with good tone, Rectum with no hemorrhoids, fissures or masses, Prostate smooth, symmetric and anodular. Prostate is moderate in size. Lateral sulci easily palpable.   SCROTUM:  No scrotal rash or lesions noticed.  Normal bilateral testes and epididymis.    PENIS: Urethral meatus normal in location and size. No urethral discharge.  Skin: No jaundice.    Neuro/Psych:  Alert and oriented x 3, affect appropriate.     Patient's BMI is out of the normal parameters.  Information about BMI was given       A copy of today's office visit with all pertinent imaging results and labs were sent to the referring physician.        Moody BruinsMatthew Langston Tuberville FNP-C  [facility]  [address]  [phone]

## 2018-06-07 NOTE — Addendum Note (Signed)
Addendum Note by Oda KiltsBetancourt, Heidi S, LPN at 69/62/9507/31/19 1040                Author: Oda KiltsBetancourt, Heidi S, LPN  Service: --  Author Type: Licensed Nurse       Filed: 06/07/18 1246  Encounter Date: 06/07/2018  Status: Signed          Editor: Oda KiltsBetancourt, Heidi S, LPN (Licensed Nurse)          Addended by: Oda KiltsBETANCOURT, HEIDI S on: 06/07/2018 12:46 PM    Modules accepted: Orders

## 2018-06-07 NOTE — Progress Notes (Signed)
Progress Notes by Delena ServeGilbert, Earleen Aoun C, NP at 06/07/18 1040                Author: Delena ServeGilbert, Asani Deniston C, NP  Service: --  Author Type: Nurse Practitioner       Filed: 06/07/18 1244  Encounter Date: 06/07/2018  Status: Signed          Editor: Delena ServeGilbert, Jaymes Revels C, NP (Nurse Practitioner)                           ASSESSMENT:       1.  Urinary frequency         2.  Benign prostatic hyperplasia with lower urinary tract symptoms, symptom details unspecified      3.  Urinary urgency         4.  Urinary incontinence, unspecified type         BPH/LUTS. PVR 0cc. AUA sx score 19    Urinary frequency, urgency, UUI using 3 diapers daily. No benefit with flomax.             PLAN:     ??  Patient very bothered by urinary symptoms. Currently reports frequency, urgency, UUI, wears 3-4 diapers daily    ??  Continue on Flomax 0.4 mg   ??  Add Myrbetriq 50mg  daily    ??  Schedule UDS   ??  Follow up for cystoscopy and TRUS to consider outlet procedure           Chief Complaint       Patient presents with        ?  Benign Prostatic Hypertrophy             EP is being seen for Urinary Frequency. The patient states that if the restroom is not reached immediately leakage may occur, which occurs moreso  in the evening hours while asleep.  FOS is steady though some post void dribble is present.          ?  Other             HCTZ taken in AM.            HISTORY OF PRESENT ILLNESS:  Dustin HolidayJames Edwards  is a 52 y.o. male who is seen  in follow up for elevated psa and urinary frequency.       Previously seen in 10/2017 with Dr Marcelle Smilingosenfield for elevated PSA 5.410 on 08/23/2017 which increased from 2.0 on 03/25/2016. His most recent PSA 1.4 on 10/13/18      Patient presents today (06/07/18) with reports of bothersome LUTS.    He is currently taking flomax 0.4mg     Patient reports bothersome urinary frequency and urgency. He notes the need to urinate q30 minutes   He reprots bothersome urinary urgency with UUI x2 daily. He does wear briefs. Uses 3-4x briefs   He  reports good FOS. He reports occasional hesitancy. Denies intermittency   Denies dysuria or hematuria. Noted 1 episode dysuria a few months ago    He drinks 1 cup coffee daily, 1 sparkling soda daily, and about 20oz water daily      A1c 6.3 on 03/03/18       He does take HCTZ in the AM          REVIEW OF LABS AND IMAGING:       PSA /TESTOSTERONE - [facility]  PSA     Latest Ref Rng & Units  0.00 - 4.00 ng/mL        08/23/2017  5.410 (A)        03/25/2016  2.0        No flowsheet data found.           Past Medical History:        Diagnosis  Date         ?  Anxiety       ?  Depression       ?  Elevated PSA       ?  Enlarged prostate with urinary obstruction       ?  Gall stones       ?  HTN (hypertension)       ?  Hypothyroidism       ?  Seizures (HCC)           ?  Urinary frequency               Past Surgical History:         Procedure  Laterality  Date          ?  HX CHOLECYSTECTOMY         ?  HX COLONOSCOPY         ?  HX FRACTURE TX              ?  HX GASTROSTOMY                 Social History          Tobacco Use         ?  Smoking status:  Never Smoker     ?  Smokeless tobacco:  Never Used       Substance Use Topics         ?  Alcohol use:  Never              Frequency:  Never         ?  Drug use:  Never             Allergies        Allergen  Reactions         ?  Penicillins  Hives and Itching     ?  Ibuprofen  Other (comments)             States he cannot take r/t hx of bleeding ulcers           History reviewed. No pertinent family history.            Review of Systems   Constitutional: Fever: No   Skin: Rash: No   HEENT: Hearing difficulty: No   Eyes: Blurred vision: No   Cardiovascular: Chest pain: No   Respiratory: Shortness of breath: No   Gastrointestinal: Nausea/vomiting: No   Musculoskeletal: Back pain: No   Neurological: Weakness: No   Psychological: Memory loss: No   Comments/additional findings:                PHYSICAL EXAMINATION:    Visit Vitals      BP  130/80     Ht  5\' 8"  (1.727 m)     Wt  261 lb (118.4  kg)        BMI  39.68 kg/m??        Constitutional: WDWN, Pleasant and appropriate affect, No acute distress .     CV:  No peripheral swelling noted   Respiratory:  No respiratory distress or difficulties   Abdomen:  No abdominal masses or tenderness. No CVA tenderness. No inguinal  hernias noted.    GU Male:  06/07/18   ZOX:[id number] normal to visual inspection, no erythema or irritation, Sphincter with good tone, Rectum with  no hemorrhoids, fissures or masses, Prostate smooth, symmetric and anodular. Prostate is moderate in size. Lateral sulci easily palpable.    SCROTUM:  No scrotal rash or lesions noticed.  Normal bilateral testes and epididymis.    PENIS: Urethral meatus normal in location and size. No urethral discharge .   Skin: No jaundice.     Neuro/Psych:  Alert and o riented x 3, affect appropriate.       Patient's BMI is out of the normal parameters.  Information about BMI was given          A copy of today's office visit with all pertinent imaging results and labs were sent to the referring physician.           Moody Bruins FNP-C   [facility]   [address]   [phone]

## 2018-06-09 LAB — CULTURE, URINE
Urine Culture, Routine: NO GROWTH
Urine Culture, Routine: NO GROWTH

## 2018-06-16 ENCOUNTER — Encounter: Attending: Nurse Practitioner | Primary: Family Medicine

## 2018-07-17 NOTE — Telephone Encounter (Signed)
Please call patient.  He called and left a message to reschedule his Urodynamics for 07/18/18.  I did not take it out of [place].  I did reschedule his 9/12 appointment with Dr Humphrey Rolls 08/31/18.     Thank you

## 2018-07-17 NOTE — Telephone Encounter (Signed)
Received a message stating patient would like to reschedule UDS. Called patient and left message to reschedule UDS. I have cancelled the appointment for 9/10. Asked patient to return my call.    Thanks,  [employer] ext [phone]

## 2018-07-18 ENCOUNTER — Encounter: Primary: Family Medicine

## 2018-07-20 ENCOUNTER — Encounter: Attending: Urology | Primary: Family Medicine

## 2018-08-23 ENCOUNTER — Encounter: Primary: Family Medicine

## 2018-08-29 MED ORDER — MIRABEGRON ER 50 MG TABLET,EXTENDED RELEASE 24 HR
50 mg | ORAL_TABLET | Freq: Every day | ORAL | 3 refills | Status: DC
Start: 2018-08-29 — End: 2018-10-30

## 2018-08-29 NOTE — Telephone Encounter (Signed)
Rx. Refilled per patient request.

## 2018-08-29 NOTE — Telephone Encounter (Signed)
Pt called request a refill on Myrbetriq sent to the pharmacy on file. He only has one pill left. Please assist.    Thank you

## 2018-08-31 ENCOUNTER — Encounter: Attending: Urology | Primary: Family Medicine

## 2018-09-21 ENCOUNTER — Ambulatory Visit: Attending: Urology | Primary: Family Medicine

## 2018-09-21 ENCOUNTER — Ambulatory Visit: Admit: 2018-09-21 | Discharge: 2018-09-21 | Attending: Urology | Primary: Family Medicine

## 2018-09-21 DIAGNOSIS — N401 Enlarged prostate with lower urinary tract symptoms: Secondary | ICD-10-CM

## 2018-09-21 LAB — AMB POC URINALYSIS DIP STICK AUTO W/O MICRO
Bilirubin (UA POC): NEGATIVE
Bilirubin, Urine, POC: NEGATIVE
Blood (UA POC): NEGATIVE
Blood (UA POC): NEGATIVE
Glucose (UA POC): NEGATIVE
Glucose, Urine, POC: NEGATIVE
Ketones (UA POC): NEGATIVE
Ketones, Urine, POC: NEGATIVE
Leukocyte Esterase, Urine, POC: NEGATIVE
Leukocyte esterase (UA POC): NEGATIVE
Nitrite, Urine, POC: NEGATIVE
Nitrites (UA POC): NEGATIVE
Protein (UA POC): NEGATIVE
Protein, Urine, POC: NEGATIVE
Specific Gravity, Urine, POC: 1.01 NA (ref 1.001–1.035)
Specific gravity (UA POC): 1.01 (ref 1.001–1.035)
Urobilinogen (UA POC): 0.2 (ref 0.2–1)
Urobilinogen, POC: 0.2 (ref 0.2–1)
pH (UA POC): 7 (ref 4.6–8.0)
pH, Urine, POC: 7 NA (ref 4.6–8.0)

## 2018-09-21 NOTE — Progress Notes (Signed)
Dustin HolidayJames Sheeler  DOB 02/24/1966  Encounter date 09/21/2018  CYSTOSCOPY PROCEDURE      Pre-procedure Diagnosis:   Encounter Diagnoses     ICD-10-CM ICD-9-CM   1. Benign prostatic hyperplasia with lower urinary tract symptoms, symptom details unspecified N40.1 600.01   2. Urinary frequency R35.0 788.41   3. Urinary urgency R39.15 788.63       Post-procedure Diagnosis: same    Consent:  All risks, benefits and options were reviewed in detail and the patient agrees to procedure. Risks include but are not limited to bleeding, infection, sepsis, death, dysuria and others.     Universal Procedure Pause:     Universal Procedure Pause:    1. Correct patient confirmed:  yes  2. Allergies confirmed:  yes  3. Patient taking antiplatelet medications:  no  4. Patient taking anticoagulants:  no  5. Correct procedure and side confirmed and consent signed:  yes  6. Correct antibiotics confirmed:  yes    Procedure:  The patient was placed in the supine position, and prepped and draped in the normal fashion. 5 ml of 4% Lidocaine gel was placed in the urethra. Once adequate anesthesia was achieved; the flexible cystoscope was placed into the bladder.     Findings as follows:    Meatus: normal   Urethra: normal   Prostate: moderately enlarged lateral lobes, small mobile median lobe   Bladder neck: normal   Trigone:  normal   Trabeculation: absent  Diverticuli: no  Lesion: no    Antibiotic provided:  No     Lab / Imaging:   Results for orders placed or performed in visit on 09/21/18   AMB POC URINALYSIS DIP STICK AUTO W/O MICRO   Result Value Ref Range    Color (UA POC) Yellow     Clarity (UA POC) Clear     Glucose (UA POC) Negative Negative    Bilirubin (UA POC) Negative Negative    Ketones (UA POC) Negative Negative    Specific gravity (UA POC) 1.010 1.001 - 1.035    Blood (UA POC) Negative Negative    pH (UA POC) 7.0 4.6 - 8.0    Protein (UA POC) Negative Negative    Urobilinogen (UA POC) 0.2 mg/dL 0.2 - 1     Nitrites (UA POC) Negative Negative    Leukocyte esterase (UA POC) Negative Negative         Diagnoses:   Encounter Diagnoses     ICD-10-CM ICD-9-CM   1. Benign prostatic hyperplasia with lower urinary tract symptoms, symptom details unspecified N40.1 600.01   2. Urinary frequency R35.0 788.41   3. Urinary urgency R39.15 788.63         SUMMARY OF VISIT AND PLAN:  ?? See separate progress note.       CC: Jed LimerickMohan, Ravinder, MD     Burtis JunesJoshua P. Garlon HatchetLangston, MD   [facility]  [address]  [place], [place] [id number]    Medical documentation provided with the assistance of Erasmo LeventhalJacqueline A. Gibson, medical scribe for Lina SarJoshua P Joshawa Dubin, MD.

## 2018-09-21 NOTE — Progress Notes (Signed)
Dustin Edwards  DOB: [birth year]  Encounter date: 09/21/2018       ASSESSMENT:   Encounter Diagnoses     ICD-10-CM ICD-9-CM   1. Benign prostatic hyperplasia with lower urinary tract symptoms, symptom details unspecified N40.1 600.01   2. Urinary frequency R35.0 788.41   3. Urinary urgency R39.15 788.63     -BPH with LUTS, specifically frequency, urgency and UUI. On Flomax 0.4 mg without benefit, started on Myrbetriq 50 mg 05/2018 with initial improvement but now no longer helpful     -History of elevated PSA of 5.41 ng/ml on 08/23/17, most recent 1.4 ng/ml on 10/13/17      PLAN:     1. Cystoscopy today revealed moderately enlarged lateral lobes, small mobile median lobe   2. TRUS volume today measured 59 cc  3. Discussed treatment options for BPH with patient including continued medical therapy vs bladder outlet procedure (Urolift vs GreenLight PVP). All risks and benefits of each reviewed. All patient questions answered.   4. Patient best candidate for Urolift   5. Patient wishes to proceed with Urolift, will have Megan call to schedule. Literature provided.     Chief Complaint   Patient presents with   ??? Benign Prostatic Hypertrophy     HISTORY OF PRESENT ILLNESS:  Dustin Edwards is a 52 y.o. male who is seen in follow up for BPH with LUTS.      In regards to voiding, he reports frequency, urgency,and UUI wearing 3-4 depends daily. Taking Flomax 0.4 mg without benefit. Started on Myrbetriq 50 mg in 05/2018 with initial improvement but now losing efficacy. Denies any gross hematuria or dysuria. Asymptomatic for UTI today. No f/c/n/v. No back or flank pain.     Previously seen in 10/2017 with Dr Marcelle Smiling for elevated PSA 5.410 on 08/23/2017 which increased from 2.0 on 03/25/2016. His most recent PSA 1.4 on 10/13/18    A1c 6.3 on 03/03/18   ??  He does take HCTZ in the AM   ??    Past Medical History:   Diagnosis Date   ??? Anxiety    ??? BPH with obstruction/lower urinary tract symptoms    ??? Depression    ??? Elevated PSA     ??? Enlarged prostate with urinary obstruction    ??? Gall stones    ??? HTN (hypertension)    ??? Hypothyroidism    ??? Seizures (HCC)    ??? Urinary frequency    ??? Urinary incontinence        Past Surgical History:   Procedure Laterality Date   ??? HX CHOLECYSTECTOMY     ??? HX COLONOSCOPY     ??? HX FRACTURE TX     ??? HX GASTROSTOMY         Social History     Tobacco Use   ??? Smoking status: Never Smoker   ??? Smokeless tobacco: Never Used   Substance Use Topics   ??? Alcohol use: Never     Frequency: Never   ??? Drug use: Never       Allergies   Allergen Reactions   ??? Penicillins Hives and Itching   ??? Ibuprofen Other (comments)     States he cannot take r/t hx of bleeding ulcers       History reviewed. No pertinent family history.    Current Outpatient Medications   Medication Sig Dispense Refill   ??? mirabegron ER (MYRBETRIQ) 50 mg ER tablet Take 1 Tab by mouth daily. 90 Tab 3   ???  buPROPion SR (WELLBUTRIN SR) 150 mg SR tablet TAKE 1 TABLET BY MOUTH 1 TIME EVERY MORNING BEFORE NOON     ??? paliperidone palmitate (INVEGA SUSTENNA) 234 mg/1.5 mL injection      ??? ergocalciferol (ERGOCALCIFEROL) 50,000 unit capsule Take 50,000 Units by mouth.     ??? levothyroxine (SYNTHROID) 100 mcg tablet TAKE 1 TABLET BY MOUTH EVERY DAY     ??? gemfibrozil (LOPID) 600 mg tablet   3   ??? hydroCHLOROthiazide (MICROZIDE) 12.5 mg capsule   0   ??? cyclobenzaprine (FLEXERIL) 10 mg tablet   0   ??? sertraline (ZOLOFT) 100 mg tablet 100 mg.     ??? tamsulosin (FLOMAX) 0.4 mg capsule   3   ??? divalproex ER (DEPAKOTE ER) 500 mg ER tablet   2       Review of Systems  Constitutional: Fever: No  Skin: Rash: No  HEENT: Hearing difficulty: No  Eyes: Blurred vision: No  Cardiovascular: Chest pain: No  Respiratory: Shortness of breath: No  Gastrointestinal: Nausea/vomiting: No  Musculoskeletal: Back pain: No  Neurological: Weakness: No  Psychological: Memory loss: No  Comments/additional findings:         PHYSICAL EXAMINATION:   Visit Vitals  BP 134/74   Ht 5\' 8"  (1.727 m)    Wt 261 lb (118.4 kg)   BMI 39.68 kg/m??     Constitutional: WDWN. Pleasant and appropriate affect. No acute distress.    CV:  No peripheral swelling noted  Respiratory: No respiratory distress or difficulties   Abdomen:  No abdominal masses or tenderness. No CVA tenderness.   Neuro/Psych:  Alert and oriented x3, affect appropriate.   Lymphatic:   No enlarged supraclavicular lymph nodes.          REVIEW OF LABS AND IMAGING:    Results for orders placed or performed in visit on 09/21/18   AMB POC URINALYSIS DIP STICK AUTO W/O MICRO   Result Value Ref Range    Color (UA POC) Yellow     Clarity (UA POC) Clear     Glucose (UA POC) Negative Negative    Bilirubin (UA POC) Negative Negative    Ketones (UA POC) Negative Negative    Specific gravity (UA POC) 1.010 1.001 - 1.035    Blood (UA POC) Negative Negative    pH (UA POC) 7.0 4.6 - 8.0    Protein (UA POC) Negative Negative    Urobilinogen (UA POC) 0.2 mg/dL 0.2 - 1    Nitrites (UA POC) Negative Negative    Leukocyte esterase (UA POC) Negative Negative         A copy of today's office visit with all pertinent imaging results and labs were sent to the referring physician.      CC: Jed LimerickMohan, Ravinder, MD      Medical documentation provided with the assistance of Glo HerringJacqueline Gibson, medical scribe for Lina SarJoshua P Nimsi Males, MD    Patient's BMI is out of the normal parameters.  Information about BMI was given and patient was advised to follow-up with their PCP for further management.

## 2018-09-21 NOTE — Progress Notes (Signed)
Progress Notes by Lina SarLangston, Kerwin Augustus P, MD at 09/21/18 1500                Author: Lina SarLangston, Maanya Hippert P, MD  Service: --  Author Type: Physician       Filed: 09/21/18 2303  Encounter Date: 09/21/2018  Status: Signed          Editor: Lina SarLangston, Joie Reamer P, MD (Physician)                    Betsey HolidayJames Bolotin   DOB 06/15/1966   Encounter date 09/21/2018   CYSTOSCOPY PROCEDURE         Pre-procedure Diagnosis:      Encounter Diagnoses              ICD-10-CM  ICD-9-CM          1.  Benign prostatic hyperplasia with lower urinary tract symptoms, symptom details unspecified  N40.1  600.01     2.  Urinary frequency  R35.0  788.41          3.  Urinary urgency  R39.15  788.63           Post-procedure Diagnosis: same      Consent:   All risks, benefits and options were reviewed in detail and the patient agrees to procedure. Risks include but are not limited to bleeding, infection, sepsis, death, dysuria and others.       Universal Procedure Pause:       Universal Procedure Pause:      1. Correct patient confirmed:  yes   2. Allergies confirmed:  yes   3. Patient taking antiplatelet medications:  no   4. Patient taking anticoagulants:  no   5. Correct procedure and side confirmed and consent signed:  yes   6. Correct antibiotics confirmed:  yes      Procedure:   The patient was placed in the supine position, and prepped and draped in the normal fashion. 5 ml of 4% Lidocaine gel was placed in the urethra. Once adequate anesthesia was achieved; the flexible cystoscope  was placed into the bladder.       Findings as follows:     Meatus: normal    Urethra: normal    Prostate: moderately enlarged lateral lobes, small mobile median lobe    Bladder neck: normal    Trigone:  normal    Trabeculation: absent   Diverticuli: no   Lesion: no      Antibiotic provided:  No       Lab / Imaging:      Results for orders placed or performed in visit on 09/21/18     AMB POC URINALYSIS DIP STICK AUTO W/O MICRO         Result  Value  Ref Range             Color (UA POC)  Yellow         Clarity (UA POC)  Clear         Glucose (UA POC)  Negative  Negative       Bilirubin (UA POC)  Negative  Negative       Ketones (UA POC)  Negative  Negative       Specific gravity (UA POC)  1.010  1.001 - 1.035       Blood (UA POC)  Negative  Negative       pH (UA POC)  7.0  4.6 - 8.0  Protein (UA POC)  Negative  Negative       Urobilinogen (UA POC)  0.2 mg/dL  0.2 - 1       Nitrites (UA POC)  Negative  Negative            Leukocyte esterase (UA POC)  Negative  Negative              Diagnoses:      Encounter Diagnoses              ICD-10-CM  ICD-9-CM          1.  Benign prostatic hyperplasia with lower urinary tract symptoms, symptom details unspecified  N40.1  600.01     2.  Urinary frequency  R35.0  788.41          3.  Urinary urgency  R39.15  788.63              SUMMARY OF VISIT AND PLAN:   ??  See separate progress note.          CC: Jed Limerick, MD       Burtis Junes. Garlon Hatchet, MD    [facility]   [address]   [place], [place] [address]      Medical documentation provided with the assistance of Erasmo Leventhal, medical scribe for Lina Sar, MD.

## 2018-09-21 NOTE — Progress Notes (Signed)
Progress Notes by Lina Sar, MD at 09/21/18 1500                Author: Lina Sar, MD  Service: --  Author Type: Physician       Filed: 09/21/18 2302  Encounter Date: 09/21/2018  Status: Signed          Editor: Lina Sar, MD (Physician)                    Dustin Edwards   DOB: 12-18-1965   Encounter date: 09/21/2018          ASSESSMENT:      Encounter Diagnoses              ICD-10-CM  ICD-9-CM          1.  Benign prostatic hyperplasia with lower urinary tract symptoms, symptom details unspecified  N40.1  600.01     2.  Urinary frequency  R35.0  788.41          3.  Urinary urgency  R39.15  788.63        -BPH with LUTS, specifically frequency, urgency and UUI. On Flomax 0.4 mg without benefit, started on Myrbetriq 50 mg 05/2018 with initial improvement but now no longer helpful       -History of elevated PSA of 5.41 ng/ml on 08/23/17, most recent 1.4 ng/ml on 10/13/17         PLAN:       1.  Cystoscopy today revealed moderately enlarged lateral lobes, small mobile median lobe    2.  TRUS volume today measured 59 cc   3.  Discussed treatment options for BPH with patient including continued medical therapy vs bladder outlet procedure (Urolift vs GreenLight PVP). All risks and benefits of each reviewed. All patient questions answered.    4.  Patient best candidate for Urolift    5.  Patient wishes to proceed with Urolift, will have Megan call to schedule. Literature provided.         Chief Complaint       Patient presents with        ?  Benign Prostatic Hypertrophy        HISTORY OF PRESENT ILLNESS:  Dustin Edwards  is a 52 y.o. male who is seen  in follow up for BPH with LUTS.        In regards to voiding, he reports frequency, urgency,and UUI wearing 3-4 depends daily. Taking Flomax 0.4 mg without benefit. Started on Myrbetriq 50 mg in 05/2018 with initial improvement but now losing efficacy. Denies any gross hematuria or dysuria.  Asymptomatic for UTI today. No f/c/n/v. No back or flank pain.        Previously seen in 10/2017 with Dr Marcelle Smiling for elevated PSA 5.410 on 08/23/2017 which increased from 2.0 on 03/25/2016. His most recent PSA 1.4 on 10/13/18      A1c 6.3 on 03/03/18    ??   He does take HCTZ in the AM    ??        Past Medical History:        Diagnosis  Date         ?  Anxiety       ?  BPH with obstruction/lower urinary tract symptoms       ?  Depression       ?  Elevated PSA       ?  Enlarged prostate with urinary obstruction       ?  Gall stones       ?  HTN (hypertension)       ?  Hypothyroidism       ?  Seizures (HCC)       ?  Urinary frequency           ?  Urinary incontinence               Past Surgical History:         Procedure  Laterality  Date          ?  HX CHOLECYSTECTOMY         ?  HX COLONOSCOPY         ?  HX FRACTURE TX              ?  HX GASTROSTOMY                 Social History          Tobacco Use         ?  Smoking status:  Never Smoker     ?  Smokeless tobacco:  Never Used       Substance Use Topics         ?  Alcohol use:  Never              Frequency:  Never         ?  Drug use:  Never             Allergies        Allergen  Reactions         ?  Penicillins  Hives and Itching     ?  Ibuprofen  Other (comments)             States he cannot take r/t hx of bleeding ulcers           History reviewed. No pertinent family history.        Current Outpatient Medications          Medication  Sig  Dispense  Refill           ?  mirabegron ER (MYRBETRIQ) 50 mg ER tablet  Take 1 Tab by mouth daily.  90 Tab  3     ?  buPROPion SR (WELLBUTRIN SR) 150 mg SR tablet  TAKE 1 TABLET BY MOUTH 1 TIME EVERY MORNING BEFORE NOON         ?  paliperidone palmitate (INVEGA SUSTENNA) 234 mg/1.5 mL injection           ?  ergocalciferol (ERGOCALCIFEROL) 50,000 unit capsule  Take 50,000 Units by mouth.         ?  levothyroxine (SYNTHROID) 100 mcg tablet  TAKE 1 TABLET BY MOUTH EVERY DAY         ?  gemfibrozil (LOPID) 600 mg tablet      3     ?  hydroCHLOROthiazide (MICROZIDE) 12.5 mg capsule      0     ?   cyclobenzaprine (FLEXERIL) 10 mg tablet      0     ?  sertraline (ZOLOFT) 100 mg tablet  100 mg.         ?  tamsulosin (FLOMAX) 0.4 mg capsule      3           ?  divalproex ER (DEPAKOTE ER) 500 mg ER tablet      2  Review of Systems   Constitutional: Fever: No   Skin: Rash: No   HEENT: Hearing difficulty: No   Eyes: Blurred vision: No   Cardiovascular: Chest pain: No   Respiratory: Shortness of breath: No   Gastrointestinal: Nausea/vomiting: No   Musculoskeletal: Back pain: No   Neurological: Weakness: No   Psychological: Memory loss: No   Comments/additional findings:             PHYSICAL EXAMINATION:    Visit Vitals      BP  134/74     Ht  5\' 8"  (1.727 m)     Wt  261 lb (118.4 kg)        BMI  39.68 kg/m??        Constitutional: WDWN. Pleasant and appropriate affect. No acute distress.     CV:  No peripheral swelling noted   Respiratory: No respiratory distress or difficulties    Abdomen:  No abdominal masses or tenderness. No CVA tenderness.    Neuro/Psych:  Alert and oriented x3, affect appropriate.    Lymphatic:   No enlarged supraclavicular lymph nodes.              REVIEW OF LABS AND IMAGING:       Results for orders placed or performed in visit on 09/21/18     AMB POC URINALYSIS DIP STICK AUTO W/O MICRO         Result  Value  Ref Range            Color (UA POC)  Yellow         Clarity (UA POC)  Clear         Glucose (UA POC)  Negative  Negative       Bilirubin (UA POC)  Negative  Negative       Ketones (UA POC)  Negative  Negative       Specific gravity (UA POC)  1.010  1.001 - 1.035       Blood (UA POC)  Negative  Negative       pH (UA POC)  7.0  4.6 - 8.0       Protein (UA POC)  Negative  Negative       Urobilinogen (UA POC)  0.2 mg/dL  0.2 - 1       Nitrites (UA POC)  Negative  Negative            Leukocyte esterase (UA POC)  Negative  Negative              A copy of today's office visit with all pertinent imaging results and labs were sent to the referring physician.        CC: Jed Limerick,  MD         Medical documentation provided with the assistance of Glo Herring, medical scribe for Lina Sar, MD      Patient's BMI is out of the normal parameters.  Information about BMI was given and patient was advised to follow-up with their PCP for further management.

## 2018-09-21 NOTE — Progress Notes (Signed)
Progress Notes by Lina SarLangston, Romanita Fager P, MD at 09/21/18 1500                Author: Lina SarLangston, Laurissa Cowper P, MD  Service: --  Author Type: Physician       Filed: 09/21/18 2303  Encounter Date: 09/21/2018  Status: Signed          Editor: Lina SarLangston, Datrell Dunton P, MD (Physician)                       Betsey HolidayJames Magouirk   11/27/1965    Encounter date: 09/21/2018       TRUS VOLUME         PREOP DIAGNOSIS: BPH with LUTS      POSTOP DIAG:  BPH with LUTS      PROCEDURE: Ultrasound Scan of the Prostate       Universal Procedure Pause:      1. Correct patient confirmed:  yes   2. Allergies confirmed:  yes   3. Patient taking antiplatelet medications:  no   4. Patient taking anticoagulants:  no   5. Correct procedure and side confirmed and consent signed:  yes   6. Correct antibiotics confirmed:  yes      DESCRIPTION OF PROCEDURE:  The patient is placed in the left lateral decubitus position. The Ultrasound Probe is passed to the ano-rectal canal.   Representative photos of the ultrasound scan are scanned.  No lesions were noted.       Interpretation:   Prostate Exam: nontender   Prostatic Volume: 59 cc   Prostatic capsule: Normal   Transition Zone: Enlarged   Lesions: none    Seminal Vesicles: Normal      CC: Jed LimerickMohan, Ravinder, MD       Burtis JunesJoshua P. Garlon HatchetLangston, MD    [facility]   [address]   [place], [place] [id number]      Medical documentation provided with the assistance of Erasmo LeventhalJacqueline A. Gibson, medical scribe for Lina SarJoshua P Klayton Monie, MD.

## 2018-09-21 NOTE — Progress Notes (Signed)
Dustin HolidayJames Edwards  06/11/1966   Encounter date: 09/21/2018     TRUS VOLUME      PREOP DIAGNOSIS: BPH with LUTS    POSTOP DIAG:  BPH with LUTS    PROCEDURE: Ultrasound Scan of the Prostate     Universal Procedure Pause:    1. Correct patient confirmed:  yes  2. Allergies confirmed:  yes  3. Patient taking antiplatelet medications:  no  4. Patient taking anticoagulants:  no  5. Correct procedure and side confirmed and consent signed:  yes  6. Correct antibiotics confirmed:  yes    DESCRIPTION OF PROCEDURE:  The patient is placed in the left lateral decubitus position. The Ultrasound Probe is passed to the ano-rectal canal.  Representative photos of the ultrasound scan are scanned.  No lesions were noted.     Interpretation:  Prostate Exam: nontender  Prostatic Volume: 59 cc  Prostatic capsule: Normal  Transition Zone: Enlarged  Lesions: none   Seminal Vesicles: Normal    CC: Jed LimerickMohan, Ravinder, MD     Burtis JunesJoshua P. Garlon HatchetLangston, MD   [facility]  [address]  [place], [place] [id number]    Medical documentation provided with the assistance of Erasmo LeventhalJacqueline A. Gibson, medical scribe for Lina SarJoshua P Omare Bilotta, MD.

## 2018-09-25 NOTE — Telephone Encounter (Signed)
I called and left a message with the patient in regards to scheduling his procedure with Dr. Garlon HatchetLangston. I left him my direct phone number and asked him to call me back at his convenience.       Lina SarLangston, Joshua P, MD  Rennis HardingWilcox, Megan   From 09/21/18   ??      Pls schedule for: UroLift 59cc   no meds to stop, ??   no clearance needed,   ok for next door,   Cipro/Gent abx.

## 2018-09-26 NOTE — Telephone Encounter (Signed)
Late Entry from 09/25/18:  The patient returned my call in regards to setting up his procedure with Dr. Garlon HatchetLangston. I told him that [facility] one more [facility] He said that he would take 10/11/18. I told him that the surgery would start at 2:00pm and he would need to arrive at 12:00pm. He confirmed. I told him that he would need to have testing done this week up to 10/02/18 at the latest. He confirmed. We discussed where he could get those done and [facility] I said that I would fax the order to their lab and he could just walk right in. I asked if he had an email that I could send him the instructions for the procedure and the testing. He said that he didn't. I said that I would make sure everything went in to the mail to him tomorrow so he had everything he needed. He said that would be fine and thanked me for the help.

## 2018-09-29 ENCOUNTER — Ambulatory Visit: Primary: Family Medicine

## 2018-09-29 ENCOUNTER — Encounter: Primary: Family Medicine

## 2018-09-29 ENCOUNTER — Ambulatory Visit: Admit: 2018-09-29 | Discharge: 2018-09-29 | Primary: Family Medicine

## 2018-09-29 DIAGNOSIS — N390 Urinary tract infection, site not specified: Secondary | ICD-10-CM

## 2018-09-29 LAB — AMB POC URINALYSIS DIP STICK AUTO W/O MICRO
Bilirubin (UA POC): NEGATIVE
Bilirubin, Urine, POC: NEGATIVE
Ketones (UA POC): NEGATIVE
Ketones, Urine, POC: NEGATIVE
Nitrite, Urine, POC: POSITIVE
Nitrites (UA POC): POSITIVE
Specific Gravity, Urine, POC: 1.03 NA (ref 1.001–1.035)
Specific gravity (UA POC): 1.03 (ref 1.001–1.035)
Urobilinogen (UA POC): 1 (ref 0.2–1)
Urobilinogen, POC: 1 (ref 0.2–1)
pH (UA POC): 6 (ref 4.6–8.0)
pH, Urine, POC: 6 NA (ref 4.6–8.0)

## 2018-09-29 NOTE — Progress Notes (Signed)
9:10 AM    Triage note universal pause    Universal Procedure Pause:    1. Correct patient confirmed:  yes    2. Allergies confirmed:  yes    3. Patient taking antiplatelet medications:  no    4. Patient taking anticoagulants:  no    5. Correct procedure and side confirmed and consent signed:  yes    6. Correct antibiotics confirmed:  no   Roselie AwkwardJessica L Cyril Railey, LPN        [facility]  Without Video- Fluoroscopy  Not completed today     Name: Betsey HolidayJames Piasecki   Date: 09/29/2018      Betsey HolidayJames Kops is a 52 y.o. male who presents today for evaluation. Patient has been referred by Jeri CosM. Gilbert NP.  Dr. Louretta ParmaKwong is available in clinic as the incident to provider.    The genital and perineal areas were cleansed with benzalkonium chloride and patient was catheterized with a 14 fr Coude catheter to assess PVR and absence of urinary tract infection.     Patient was found to have positive nitrites on UA and was rescheduled per UDS protocol. Message sent to provider and staff for awareness of new date and treatment options if needed one C&S is resulted.           ICD-10-CM ICD-9-CM    1. Urinary tract infection without hematuria, site unspecified N39.0 599.0 URINE C&S      AMB POC URINALYSIS DIP STICK AUTO W/O MICRO     Orders Placed This Encounter   ??? URINE C&S     Order Specific Question:   Specify all ANTIBIOTIC ALLERGIES:     Answer:   Penicillin     Order Specific Question:   Specify the urine source     Answer:   Straight Cath In/Out   ??? AMB POC URINALYSIS DIP STICK AUTO W/O MICRO       Past Medical History:   Diagnosis Date   ??? Anxiety    ??? BPH with obstruction/lower urinary tract symptoms    ??? Depression    ??? Elevated PSA    ??? Enlarged prostate with urinary obstruction    ??? Gall stones    ??? HTN (hypertension)    ??? Hypothyroidism    ??? Seizures (HCC)    ??? Urinary frequency    ??? Urinary incontinence       Past Surgical History:   Procedure Laterality Date   ??? HX CHOLECYSTECTOMY     ??? HX COLONOSCOPY      ??? HX FRACTURE TX     ??? HX GASTROSTOMY       Roselie AwkwardJessica L Zara Wendt, LPN    -- Reviewed by me and agree.   -Etheleen NicksPETER O KWONG, MD

## 2018-09-29 NOTE — Progress Notes (Signed)
Doxy 100 BID x 10 days

## 2018-09-29 NOTE — Progress Notes (Signed)
 9:10 AM    Triage note universal pause    Universal Procedure Pause:    1. Correct patient confirmed:  yes    2. Allergies confirmed:  yes    3. Patient taking antiplatelet medications:  no    4. Patient taking anticoagulants:  no    5. Correct procedure and side confirmed and consent signed:  yes    6. Correct antibiotics confirmed:  no   Dustin LITTIE Donald, LPN        [facility] [place]  Without Video- Fluoroscopy  Not completed today     Name: Dustin Edwards   Date: 09/29/2018      Dustin Edwards is a 52 y.o. male who presents today for evaluation. Patient has been referred by Dustin Forte NP.  Dr. Marshia is available in clinic as the incident to provider.    The genital and perineal areas were cleansed with benzalkonium chloride and patient was catheterized with a 14 fr Coude catheter to assess PVR and absence of urinary tract infection.     Patient was found to have positive nitrites on UA and was rescheduled per UDS protocol. Message sent to provider and staff for awareness of new date and treatment options if needed one C&S is resulted.           ICD-10-CM ICD-9-CM    1. Urinary tract infection without hematuria, site unspecified N39.0 599.0 URINE C&S      AMB POC URINALYSIS DIP STICK AUTO W/O MICRO     Orders Placed This Encounter   . URINE C&S     Order Specific Question:   Specify all ANTIBIOTIC ALLERGIES:     Answer:   Penicillin     Order Specific Question:   Specify the urine source     Answer:   Straight Cath In/Out   . AMB POC URINALYSIS DIP STICK AUTO W/O MICRO       Past Medical History:   Diagnosis Date   . Anxiety    . BPH with obstruction/lower urinary tract symptoms    . Depression    . Elevated PSA    . Enlarged prostate with urinary obstruction    . Gall stones    . HTN (hypertension)    . Hypothyroidism    . Seizures (HCC)    . Urinary frequency    . Urinary incontinence       Past Surgical History:   Procedure Laterality Date   . HX CHOLECYSTECTOMY     . HX COLONOSCOPY     . HX FRACTURE TX      . HX GASTROSTOMY       Dustin LITTIE Donald, LPN    -- Reviewed by me and agree.   -Dustin MALVA MARSHIA, MD

## 2018-09-29 NOTE — Telephone Encounter (Signed)
lmom for pt to be aware that UDS is not needed per Dr. Garlon HatchetLangston. He is scheduled for a surgical procedure 10/11/18.  Asked him to call call heidi his Nurse if he had further questions.   Roselie AwkwardJessica L Shakea Isip, LPN

## 2018-10-02 LAB — METABOLIC PANEL, BASIC
Anion gap: 14.6 mmol/L
BUN: 12 mg/dL (ref 6–22)
CO2: 26 mmol/L (ref 20–32)
Calcium: 9.7 mg/dL (ref 8.4–10.5)
Chloride: 99 mmol/L (ref 98–110)
Creatinine: 0.9 mg/dL (ref 0.5–1.2)
GFRAA: >60 (ref >60.0)
GFRNA: >60 (ref >60.0)
Glucose: 87 mg/dL (ref 70–99)
Potassium: 4.2 mmol/L (ref 3.5–5.5)
Sodium: 140 mmol/L (ref 133–145)

## 2018-10-02 LAB — URINALYSIS W/ RFLX MICROSCOPIC
Bilirubin, Urine: NEGATIVE
Bilirubin: NEGATIVE
Blood, Urine: NEGATIVE
Blood: NEGATIVE
Glucose, UA: NEGATIVE mg/dL
Glucose: NEGATIVE mg/dL
HYLINE CAST, 6014: NEGATIVE /lpf
HYLINE CAST: NEGATIVE /lpf
Ketone: NEGATIVE mg/dL
Ketones, Urine: NEGATIVE mg/dL
Nitrite, Urine: NEGATIVE
Nitrites: NEGATIVE
Protein, UA: NEGATIVE mg/dL
Protein: NEGATIVE mg/dL
Specific Gravity, UA: 1.009 NA (ref 1.005–1.03)
Specific Gravity: 1.009 (ref 1.005–1.03)
Urobilinogen, Urine: <2 mg/dL (ref <2.0)
Urobilinogen: <2 mg/dL (ref <2.0)
pH (UA): 7 pH (ref 5.0–8.0)
pH, UA: 7 pH (ref 5.0–8.0)

## 2018-10-02 LAB — DIFFERENTIAL, MANUAL
Bands%-DIF: 1 % (ref 0–5)
Basophils abs-DIF: 0.1 10*3/uL (ref 0.0–0.2)
Basophils%-DIF: 1 % (ref 0–2)
Eos abs-DIF: 0.3 10*3/uL (ref 0.0–0.5)
Eosinophils%-DIF: 3 % (ref 0–6)
Lymphocytes%-DIF: 37 % (ref 20–45)
Lymphs abs-DIF: 3.9 10*3/uL (ref 1.0–4.8)
METAMYELOCYTES: 1 % — ABNORMAL HIGH (ref <=0)
Metamyelocytes abs: 0.1 10*3/uL — ABNORMAL HIGH (ref <=0.0)
Monocytes abs-DIF: 1.1 10*3/uL — ABNORMAL HIGH (ref 0.1–1.0)
Monocytes%-DIF: 10 % (ref 3–12)
Neutrophils abs: 5 10*3/uL (ref 1.8–7.7)
Neutrophils%-DIF: 47 % (ref 40–75)
SMEAR EVAL: NORMAL
Total Cells Counted: 100

## 2018-10-02 LAB — CBC WITH AUTOMATED DIFF
HCT: 38.9 % — ABNORMAL LOW (ref 39.3–51.6)
HGB: 12.9 g/dL — ABNORMAL LOW (ref 13.1–17.2)
IMMATURE PLATELET FRACTION: 6.7 % (ref 1.1–7.1)
MCH: 28 pg (ref 26–34)
MCHC: 33 g/dL (ref 31–36)
MCV: 85 fL (ref 80–95)
MPV: 11.3 fL (ref 9.0–13.0)
PLATELET: 347 10*3/uL (ref 140–440)
RBC: 4.56 M/uL (ref 3.80–5.80)
RDW: 14.9 % (ref 10.0–15.5)
WBC: 10.5 10*3/uL (ref 4.0–11.0)

## 2018-10-02 LAB — BASIC METABOLIC PANEL
Anion Gap: 14.6 mmol/L
BUN: 12 mg/dL (ref 6–22)
CO2: 26 mmol/L (ref 20–32)
Calcium: 9.7 mg/dL (ref 8.4–10.5)
Chloride: 99 mmol/L (ref 98–110)
Creatinine: 0.9 mg/dL (ref 0.5–1.2)
GFR African American: >60 (ref >60.0)
GFR Non-African American: >60 (ref >60.0)
Glucose: 87 mg/dL (ref 70–99)
Potassium: 4.2 mmol/L (ref 3.5–5.5)
Sodium: 140 mmol/L (ref 133–145)

## 2018-10-02 LAB — CBC WITH AUTO DIFFERENTIAL
Hematocrit: 38.9 % — ABNORMAL LOW (ref 39.3–51.6)
Hemoglobin: 12.9 g/dL — ABNORMAL LOW (ref 13.1–17.2)
IMMATURE PLATELET FRACTION: 6.7 % (ref 1.1–7.1)
MCH: 28 pg (ref 26–34)
MCHC: 33 g/dL (ref 31–36)
MCV: 85 fL (ref 80–95)
MPV: 11.3 fL (ref 9.0–13.0)
Platelets: 347 10*3/uL (ref 140–440)
RBC: 4.56 M/uL (ref 3.80–5.80)
RDW: 14.9 % (ref 10.0–15.5)
WBC: 10.5 10*3/uL (ref 4.0–11.0)

## 2018-10-02 NOTE — Progress Notes (Signed)
We need to look out for his UCx and start him on abx = this is his UA from today and he had a culture sent last week but I guess results aren't back yet. Would assume usual abx appropriate but we can wait until tomorrow for results but if not then use empiric choices.     Example:   Can we treat with Bactrim DS BID, Cipro 500 BID, or Doxy 100 BID x 10 days pending med interactions.

## 2018-10-02 NOTE — Progress Notes (Signed)
We need to look out for his UCx and start him on abx = this is his UA from today and he had a culture sent last week but I guess results aren't back yet. Would assume usual abx appropriate but we can wait until tomorrow for results but if not then use empiric choices.     Example:   Can we treat with Bactrim DS BID, Cipro 500 BID, or Doxy 100 BID x 10 days pending med interactions.

## 2018-10-03 LAB — CULTURE, URINE

## 2018-10-03 MED ORDER — DOXYCYCLINE 100 MG TAB
100 mg | ORAL_TABLET | Freq: Two times a day (BID) | ORAL | 0 refills | Status: AC
Start: 2018-10-03 — End: 2018-10-13

## 2018-10-03 NOTE — Telephone Encounter (Addendum)
Pt knows positive urine culture called in doxy 100mg  1 po bid x 10 days. Pt knows Oda KiltsHeidi S Gwen Edler, LPN----- Message from Lina SarJoshua P Langston, MD sent at 10/02/2018  2:46 PM EST -----  We need to look out for his UCx and start him on abx = this is his UA from today and he had a culture sent last week but I guess results aren't back yet. Would assume usual abx appropriate but we can wait until tomorrow for results but if not then use empiric choices.     Example:   Can we treat with Bactrim DS BID, Cipro 500 BID, or Doxy 100 BID x 10 days pending med interactions.

## 2018-10-04 LAB — CULTURE, URINE
RESULT: ABNORMAL — AB
Result: ABNORMAL — AB

## 2018-10-09 NOTE — Other (Signed)
Pt instructed to take  Synthroid and depakote the  morning of surgery with a small amount of water. Pt said that he would be taking a " cab from his insurance company" said his girlfriend would be with him both ways in the cab.  Informed pt that family would need to remain in the facility until they are discharged home and that it is recommended that they have someone with them for 24 hours after surgery. Pt verbalized understanding.

## 2018-10-09 NOTE — Interval H&P Note (Signed)
 Pt instructed to take  Synthroid and depakote the  morning of surgery with a small amount of water. Pt said that he would be taking a  cab from his insurance company said his girlfriend would be with him both ways in the cab.  Informed pt that family would need to remain in the facility until they are discharged home and that it is recommended that they have someone with them for 24 hours after surgery. Pt verbalized understanding.

## 2018-10-11 NOTE — Telephone Encounter (Signed)
I called and spoke with the patient in regards to his surgery today with Dr. Garlon HatchetLangston. I told him that the date I have been able to get him moved to is Monday 10/23/18. He said that would be fine. I told him [facility] and he would need to check in at 9:30am for a 11:30am start time. He confirmed. I told him to finish the antibiotics that he is currently on and that I was asking Dr. Garlon HatchetLangston if he wanted him to stay on medication until the new surgery date and told him I would call him back with that answer. He said that was fine. I told him that I would be sending him the new instructions and asked if I could email them to him or if he needed them mailed. He said I would have to mail them and I said I would get those out today. he thanked me.

## 2018-10-12 ENCOUNTER — Encounter: Attending: Urology | Primary: Family Medicine

## 2018-10-17 ENCOUNTER — Encounter: Attending: Urology | Primary: Family Medicine

## 2018-10-20 NOTE — Telephone Encounter (Signed)
I called Kara Meadmma back at the hospital and told her that this was a rescheduled date for the patient's surgery and I did not catch that the change in facility would change the no auth required that we received originally because both the doctor and original location were in network. I told her that I have called his insurance and have a pending Auth #[id number] and I would be faxing clinicals right now to [phone] for them to review. She confirmed and thanked me.    Pecola Leisureeese, Jocelynn Particia JasperM  Wilcox, Megan      ??      Pt needs auth for procedure for 10/23/18   Myriam JacobsonHelen [phone]   Fax# [phone]

## 2018-10-24 NOTE — Telephone Encounter (Signed)
I called and spoke with the patient in regards to rescheduling his surgery from yesterday after he had to cancel due to location mix up and not having a ride. I told him that I could get him [facility] HatchetLangston. He said that would be fine. I offered him the 2 times that I have and he said that he would take the 11:15am start time with the 9:15am arrival time. I told him that I would need him to have  A new urine culture done since we did not repeat it for yesterday's surgery and asked if he could do that this week. He said that he could do it Monday or Tuesday 10/30/18. I said that Monday would be best for that and asked where he would go. [facility] I [facility]. I said that I would get everything changed and mailed to him tomorrow. He thanked me.

## 2018-10-30 LAB — CBC WITH AUTOMATED DIFF
ABS. BASOPHILS: 0.1 10*3/uL (ref 0.0–0.2)
ABS. EOSINOPHILS: 0.2 10*3/uL (ref 0.0–0.5)
ABS. MONOCYTES: 1.3 10*3/uL — ABNORMAL HIGH (ref 0.1–1.0)
ABS. NEUTROPHILS: 5.2 10*3/uL (ref 1.8–7.7)
ABSOLUTE LYMPHOCYTE COUNT: 2.8 10*3/uL (ref 1.0–4.8)
BASOPHILS: 1 % (ref 0–2)
EOSINOPHILS: 2 % (ref 0–6)
HCT: 39.6 % (ref 39.3–51.6)
HGB: 12.8 g/dL — ABNORMAL LOW (ref 13.1–17.2)
Lymphocytes: 29 % (ref 20–45)
MCH: 27 pg (ref 26–34)
MCHC: 32 g/dL (ref 31–36)
MCV: 84 fL (ref 80–95)
MONOCYTES: 14 % — ABNORMAL HIGH (ref 3–12)
MPV: 11.9 fL (ref 9.0–13.0)
NEUTROPHILS: 54 % (ref 40–75)
PLATELET: 301 10*3/uL (ref 140–440)
RBC: 4.7 M/uL (ref 3.80–5.80)
RDW: 14.6 % (ref 10.0–15.5)
WBC: 9.6 10*3/uL (ref 4.0–11.0)

## 2018-10-30 LAB — METABOLIC PANEL, BASIC
Anion gap: 12.2 mmol/L
BUN: 10 mg/dL (ref 6–22)
CO2: 28 mmol/L (ref 20–32)
Calcium: 9.8 mg/dL (ref 8.4–10.5)
Chloride: 96 mmol/L — ABNORMAL LOW (ref 98–110)
Creatinine: 0.9 mg/dL (ref 0.5–1.2)
GFRAA: >60 (ref >60.0)
GFRNA: >60 (ref >60.0)
Glucose: 105 mg/dL — ABNORMAL HIGH (ref 70–99)
Potassium: 3.5 mmol/L (ref 3.5–5.5)
Sodium: 136 mmol/L (ref 133–145)

## 2018-10-30 LAB — URINALYSIS W/ RFLX MICROSCOPIC
Bilirubin, Urine: NEGATIVE
Bilirubin: NEGATIVE
Blood, Urine: NEGATIVE
Blood: NEGATIVE
Glucose, UA: NEGATIVE mg/dL
Glucose: NEGATIVE mg/dL
Ketone: NEGATIVE mg/dL
Ketones, Urine: NEGATIVE mg/dL
Leukocyte Esterase, Urine: NEGATIVE
Leukocyte Esterase: NEGATIVE
Nitrite, Urine: NEGATIVE
Nitrites: NEGATIVE
Protein, UA: NEGATIVE mg/dL
Protein: NEGATIVE mg/dL
Specific Gravity, UA: 1.014 NA (ref 1.005–1.03)
Specific Gravity: 1.014 (ref 1.005–1.03)
Urobilinogen, Urine: <2 mg/dL (ref <2.0)
Urobilinogen: <2 mg/dL (ref <2.0)
pH (UA): 5 pH (ref 5.0–8.0)
pH, UA: 5 pH (ref 5.0–8.0)

## 2018-10-30 LAB — BASIC METABOLIC PANEL
Anion Gap: 12.2 mmol/L
BUN: 10 mg/dL (ref 6–22)
CO2: 28 mmol/L (ref 20–32)
Calcium: 9.8 mg/dL (ref 8.4–10.5)
Chloride: 96 mmol/L — ABNORMAL LOW (ref 98–110)
Creatinine: 0.9 mg/dL (ref 0.5–1.2)
GFR African American: >60 (ref >60.0)
GFR Non-African American: >60 (ref >60.0)
Glucose: 105 mg/dL — ABNORMAL HIGH (ref 70–99)
Potassium: 3.5 mmol/L (ref 3.5–5.5)
Sodium: 136 mmol/L (ref 133–145)

## 2018-10-30 LAB — CBC WITH AUTO DIFFERENTIAL
ABSOLUTE LYMPHOCYTE COUNT, 10803: 2.8 10*3/uL (ref 1.0–4.8)
Basophils %: 1 % (ref 0–2)
Basophils Absolute: 0.1 10*3/uL (ref 0.0–0.2)
Eosinophils %: 2 % (ref 0–6)
Eosinophils Absolute: 0.2 10*3/uL (ref 0.0–0.5)
Hematocrit: 39.6 % (ref 39.3–51.6)
Hemoglobin: 12.8 g/dL — ABNORMAL LOW (ref 13.1–17.2)
Lymphocytes %: 29 % (ref 20–45)
MCH: 27 pg (ref 26–34)
MCHC: 32 g/dL (ref 31–36)
MCV: 84 fL (ref 80–95)
MPV: 11.9 fL (ref 9.0–13.0)
Monocytes %: 14 % — ABNORMAL HIGH (ref 3–12)
Monocytes Absolute: 1.3 10*3/uL — ABNORMAL HIGH (ref 0.1–1.0)
Neutrophils %: 54 % (ref 40–75)
Neutrophils Absolute: 5.2 10*3/uL (ref 1.8–7.7)
Platelets: 301 10*3/uL (ref 140–440)
RBC: 4.7 M/uL (ref 3.80–5.80)
RDW: 14.6 % (ref 10.0–15.5)
WBC: 9.6 10*3/uL (ref 4.0–11.0)

## 2018-10-30 MED ORDER — MIRABEGRON ER 50 MG TABLET,EXTENDED RELEASE 24 HR
50 mg | ORAL_TABLET | Freq: Every day | ORAL | 3 refills | Status: AC
Start: 2018-10-30 — End: ?

## 2018-10-30 NOTE — Telephone Encounter (Signed)
Rx. Has been refilled per patient request.

## 2018-10-30 NOTE — Telephone Encounter (Signed)
Patient called and requested a refill for his Myrbetriq 50mg .  Please let patient know when this has been sent to the pharmacy.    Thank you

## 2018-10-31 LAB — CULTURE, URINE
RESULT: NORMAL
Result: NORMAL

## 2018-11-13 NOTE — Telephone Encounter (Signed)
I called and spoke with the patient in regards to checking on him after the surgery last week with Dr. Garlon Hatchet. He said that he was doing good. I told him that I also called to see if he saw in his paperwork that I had made him an appointment for tomorrow 11/14/2018 and wanted to see if he could keep it. He said that he had not seen the appointment so he would need some more days to set up transport. I asked if I moved him to Thursday 11/16/2018 if that would be enough time. He said no. We moved his appointment to Friday 11/24/2018 at 9:00am with Susy Frizzle, NP. He said that was fine and thanked me for the help.

## 2018-11-14 ENCOUNTER — Encounter: Payer: PRIVATE HEALTH INSURANCE | Attending: Nurse Practitioner | Primary: Family Medicine

## 2018-11-14 ENCOUNTER — Encounter: Attending: Nurse Practitioner | Primary: Family Medicine

## 2018-11-24 ENCOUNTER — Encounter: Payer: PRIVATE HEALTH INSURANCE | Attending: Nurse Practitioner | Primary: Family Medicine

## 2018-11-24 NOTE — Progress Notes (Unsigned)
Dustin Edwards  DOB: [birth year]  Encounter date: 11/24/2018       ASSESSMENT:   No diagnosis found.  -BPH with LUTS, specifically frequency, urgency and UUI. On Flomax 0.4 mg without benefit, started on Myrbetriq 50 mg 05/2018 with initial improvement but now no longer helpful     Status post UroLift #6 on 11/06/2018    -History of elevated PSA of 5.41 ng/ml on 08/23/17, most recent 1.4 ng/ml on 10/13/17      PLAN:     1. Cystoscopy today revealed moderately enlarged lateral lobes, small mobile median lobe   2. TRUS volume today measured 59 cc  3. Discussed treatment options for BPH with patient including continued medical therapy vs bladder outlet procedure (Urolift vs GreenLight PVP). All risks and benefits of each reviewed. All patient questions answered.   4. Patient best candidate for Urolift   5. Patient wishes to proceed with Urolift, will have Megan call to schedule. Literature provided.     No chief complaint on file.    HISTORY OF PRESENT ILLNESS:  Dustin Edwards is a 53 y.o. male who is seen in follow up for BPH with LUTS.     Patient presents today postoperatively, he is status post UroLift #6 on 11/06/2018.     In regards to voiding, he reports frequency, urgency,and UUI wearing 3-4 depends daily. Taking Flomax 0.4 mg without benefit. Started on Myrbetriq 50 mg in 05/2018 with initial improvement but now losing efficacy. Denies any gross hematuria or dysuria. Asymptomatic for UTI today. No f/c/n/v. No back or flank pain.     Previously seen in 10/2017 with Dr Marcelle Smiling for elevated PSA 5.410 on 08/23/2017 which increased from 2.0 on 03/25/2016. His most recent PSA 1.4 on 10/13/18    A1c 6.3 on 03/03/18   ??  He does take HCTZ in the AM   ??    Past Medical History:   Diagnosis Date   ??? Anxiety    ??? BPH with obstruction/lower urinary tract symptoms    ??? Depression    ??? Elevated PSA    ??? Enlarged prostate with urinary obstruction    ??? Gall stones    ??? HTN (hypertension)    ??? Hypothyroidism    ??? Seizures (HCC)      says had one in 1990's   ??? Urinary frequency    ??? Urinary incontinence        Past Surgical History:   Procedure Laterality Date   ??? HX CHOLECYSTECTOMY     ??? HX COLONOSCOPY     ??? HX FRACTURE TX      right  lower leg   ??? HX GASTROSTOMY     ??? HX UROLOGICAL  11/06/2018    cysto and urolift (transprostatic implants #6), Dr. Sharla Kidney       Social History     Tobacco Use   ??? Smoking status: Never Smoker   ??? Smokeless tobacco: Never Used   Substance Use Topics   ??? Alcohol use: Never     Frequency: Never   ??? Drug use: Never       Allergies   Allergen Reactions   ??? Penicillins Hives and Itching   ??? Ibuprofen Other (comments)     States he cannot take r/t hx of bleeding ulcers       No family history on file.    Current Outpatient Medications   Medication Sig Dispense Refill   ??? mirabegron ER (MYRBETRIQ) 50 mg ER tablet  Take 1 Tab by mouth daily. 90 Tab 3   ??? buPROPion SR (WELLBUTRIN SR) 150 mg SR tablet TAKE 1 TABLET BY MOUTH 1 TIME EVERY MORNING BEFORE NOON     ??? paliperidone palmitate (INVEGA SUSTENNA) 234 mg/1.5 mL injection every three (3) months.     ??? ergocalciferol (ERGOCALCIFEROL) 50,000 unit capsule Take 50,000 Units by mouth every seven (7) days.     ??? levothyroxine (SYNTHROID) 100 mcg tablet TAKE 1 TABLET BY MOUTH EVERY DAY     ??? gemfibrozil (LOPID) 600 mg tablet two (2) times a day.  3   ??? hydroCHLOROthiazide (MICROZIDE) 12.5 mg capsule daily.  0   ??? cyclobenzaprine (FLEXERIL) 10 mg tablet three (3) times daily as needed.  0   ??? sertraline (ZOLOFT) 100 mg tablet 100 mg daily.     ??? tamsulosin (FLOMAX) 0.4 mg capsule daily.  3   ??? divalproex ER (DEPAKOTE ER) 500 mg ER tablet two (2) times a day.  2       Review of Systems  Constitutional: Fever:   Skin: Rash:   HEENT: Hearing difficulty:   Eyes: Blurred vision:   Cardiovascular: Chest pain:   Respiratory: Shortness of breath:   Gastrointestinal: Nausea/vomiting:   Musculoskeletal: Back pain:   Neurological: Weakness:   Psychological: Memory loss:    Comments/additional findings:         PHYSICAL EXAMINATION:   There were no vitals taken for this visit.  Constitutional: WDWN. Pleasant and appropriate affect. No acute distress.    CV:  No peripheral swelling noted  Respiratory: No respiratory distress or difficulties   Abdomen:  No abdominal masses or tenderness. No CVA tenderness.   Neuro/Psych:  Alert and oriented x3, affect appropriate.   Lymphatic:   No enlarged supraclavicular lymph nodes.          REVIEW OF LABS AND IMAGING:    Results for orders placed or performed in visit on 10/30/18   CULTURE, URINE   Result Value Ref Range    RESULT Normal    URINALYSIS W/ RFLX MICROSCOPIC   Result Value Ref Range    Specific Gravity 1.014 1.005 - 1.03    pH (UA) 5.0 5.0 - 8.0 pH    Protein Negative Negative, mg/dL    Glucose Negative Negative mg/dL    Ketone Negative Negative, mg/dL    Bilirubin Negative Negative    Blood Negative Negative    Nitrites Negative Negative    Leukocyte Esterase Negative Negative    Urobilinogen <2.0 <2.0 mg/dL   METABOLIC PANEL, BASIC   Result Value Ref Range    Glucose 105 (H) 70 - 99 mg/dL    BUN 10 6 - 22 mg/dL    Creatinine 0.9 0.5 - 1.2 mg/dL    Sodium 440 102 - 725 mmol/L    Potassium 3.5 3.5 - 5.5 mmol/L    Chloride 96 (L) 98 - 110 mmol/L    CO2 28 20 - 32 mmol/L    Calcium 9.8 8.4 - 10.5 mg/dL    Anion gap 36.6 mmol/L    GFRAA >60.0 >60.0    GFRNA >60.0 >60.0   CBC WITH AUTOMATED DIFF   Result Value Ref Range    WBC 9.6 4.0 - 11.0 K/uL    RBC 4.70 3.80 - 5.80 M/uL    HGB 12.8 (L) 13.1 - 17.2 g/dL    HCT 44.0 34.7 - 42.5 %    MCV 84 80 - 95 fL  MCH 27 26 - 34 pg    MCHC 32 31 - 36 g/dL    RDW 16.114.6 09.610.0 - 04.515.5 %    PLATELET 301 140 - 440 K/uL    MPV 11.9 9.0 - 13.0 fL    NEUTROPHILS 54 40 - 75 %    Lymphocytes 29 20 - 45 %    MONOCYTES 14 (H) 3 - 12 %    EOSINOPHILS 2 0 - 6 %    BASOPHILS 1 0 - 2 %    ABS. NEUTROPHILS 5.2 1.8 - 7.7 K/uL    ABSOLUTE LYMPHOCYTE COUNT 2.8 1.0 - 4.8 K/uL    ABS. MONOCYTES 1.3 (H) 0.1 - 1.0 K/uL     ABS. EOSINOPHILS 0.2 0.0 - 0.5 K/uL    ABS. BASOPHILS 0.1 0.0 - 0.2 K/uL         A copy of today's office visit with all pertinent imaging results and labs were sent to the referring physician.      CC: Jed LimerickMohan, Ravinder, MD    Moody BruinsMatthew Oluwademilade Mckiver FNP-C  [facility]  [address]  [phone]      Patient's BMI is out of the normal parameters.  Information about BMI was given and patient was advised to follow-up with their PCP for further management.

## 2018-11-24 NOTE — Telephone Encounter (Signed)
Patient called and asked to speak to the nurse about r/s his appt today at 9 am with Theo Dills for post op- he said he forgot about the appt.  He is having urinary frequency and is often times unable to make it to the bathroom ( wets himself).  Tried to help him r/s the appt but there is no sooner appt that February with Theo Dills.  Can you please assist.  His tel:[phone]

## 2018-11-30 NOTE — Telephone Encounter (Signed)
Pt is requesting more pain medicine, recent surgery. Pt can be reached at [phone]. thanks

## 2018-12-01 NOTE — Telephone Encounter (Signed)
Talked to pt who still has dysuria I wanted him to come in today but he has transportation issues and can not be here till wed, pt to come in Wednesday at 2pm  Oda Kilts, LPN

## 2018-12-04 NOTE — Telephone Encounter (Signed)
LVM for patient to contact the office regarding medication refill and follow up from procedure.

## 2018-12-05 ENCOUNTER — Encounter: Payer: PRIVATE HEALTH INSURANCE | Attending: Nurse Practitioner | Primary: Family Medicine

## 2018-12-06 ENCOUNTER — Encounter: Payer: PRIVATE HEALTH INSURANCE | Primary: Family Medicine

## 2018-12-14 ENCOUNTER — Institutional Professional Consult (permissible substitution): Admit: 2018-12-14 | Payer: PRIVATE HEALTH INSURANCE | Primary: Family Medicine

## 2018-12-14 DIAGNOSIS — R350 Frequency of micturition: Secondary | ICD-10-CM

## 2018-12-14 LAB — AMB POC URINALYSIS DIP STICK AUTO W/O MICRO
Bilirubin (UA POC): NEGATIVE
Bilirubin, Urine, POC: NEGATIVE
Blood (UA POC): NEGATIVE
Blood (UA POC): NEGATIVE
Glucose (UA POC): NEGATIVE
Glucose, Urine, POC: NEGATIVE
Ketones (UA POC): NEGATIVE
Ketones, Urine, POC: NEGATIVE
Leukocyte Esterase, Urine, POC: NEGATIVE
Leukocyte esterase (UA POC): NEGATIVE
Nitrite, Urine, POC: NEGATIVE
Nitrites (UA POC): NEGATIVE
Protein (UA POC): NEGATIVE
Protein, Urine, POC: NEGATIVE
Specific Gravity, Urine, POC: 1.025 NA (ref 1.001–1.035)
Specific gravity (UA POC): 1.025 (ref 1.001–1.035)
Urobilinogen (UA POC): 0.2 (ref 0.2–1)
Urobilinogen, POC: 0.2 (ref 0.2–1)
pH (UA POC): 7.5 (ref 4.6–8.0)
pH, Urine, POC: 7.5 NA (ref 4.6–8.0)

## 2018-12-14 NOTE — Progress Notes (Signed)
Neg CX - I guess this was done because he was having sx?

## 2018-12-14 NOTE — Progress Notes (Signed)
Dustin Edwards is here today for urine culture per Dr. Langston order because patient is experiencing the below symptoms.  Dr. Liu was in the office as incident to.        Visit Vitals  Temp 97.7 ??F (36.5 ??C)   Ht 5' 8.5" (1.74 m)   Wt 255 lb (115.7 kg)   BMI 38.21 kg/m??        Patient c/o odor to urine NO  Decreased output: NO  Difficulty voiding: NO  Burning with urination: YES  Incontinence:  NO  Split [place]: NO    Associated signs and symptoms:    none    The pain is mild.  Severity:  1    Urine was obtained by:  Clean catch     Any recent Urologic surgeries or procedures:  Yes; Name: Cystoscopy, with transprostatic implant insertion  If so - what type of surgery:  Cystoscopy, with transprostatic implant insertion    Any History of Stones:  NO     History of bladder tumor:  NO  Recurrent UTI's:  NO    This is not a repeat urine culture.   UA performed: Yes    Results for orders placed or performed in visit on 12/14/18   AMB POC URINALYSIS DIP STICK AUTO W/O MICRO   Result Value Ref Range    Color (UA POC) Yellow     Clarity (UA POC) Clear     Glucose (UA POC) Negative Negative    Bilirubin (UA POC) Negative Negative    Ketones (UA POC) Negative Negative    Specific gravity (UA POC) 1.025 1.001 - 1.035    Blood (UA POC) Negative Negative    pH (UA POC) 7.5 4.6 - 8.0    Protein (UA POC) Negative Negative    Urobilinogen (UA POC) 0.2 mg/dL 0.2 - 1    Nitrites (UA POC) Negative Negative    Leukocyte esterase (UA POC) Negative Negative        Urine was sent to [facility] for processing of urine culture.   [name] informed that it will be at least 48 hours before results are available     Orders Placed This Encounter   ??? CULTURE, URINE   ??? AMB POC URINALYSIS DIP STICK AUTO W/O MICRO   ??? docusate sodium (COLACE) 100 mg capsule     Sig: Take 100 mg by mouth.   ??? traZODone (DESYREL) 100 mg tablet     Sig: Take 1 Tab by mouth.          [name]

## 2018-12-14 NOTE — Progress Notes (Signed)
Neg CX - I guess this was done because he was having sx?

## 2018-12-14 NOTE — Progress Notes (Signed)
 Dustin Edwards is here today for urine culture per Dr. Honore order because patient is experiencing the below symptoms.  Dr. Arminda was in the office as incident to.        Visit Vitals  Temp 97.7 F (36.5 C)   Ht 5' 8.5 (1.74 m)   Wt 255 lb (115.7 kg)   BMI 38.21 kg/m        Patient c/o odor to urine NO  Decreased output: NO  Difficulty voiding: NO  Burning with urination: YES  Incontinence:  NO  Split Stream: NO    Associated signs and symptoms:    none    The pain is mild.  Severity:  1    Urine was obtained by:  Clean catch     Any recent Urologic surgeries or procedures:  Yes; Name: Cystoscopy, with transprostatic implant insertion  If so - what type of surgery:  Cystoscopy, with transprostatic implant insertion    Any History of Stones:  NO     History of bladder tumor:  NO  Recurrent UTI's:  NO    This is not a repeat urine culture.   UA performed: Yes    Results for orders placed or performed in visit on 12/14/18   AMB POC URINALYSIS DIP STICK AUTO W/O MICRO   Result Value Ref Range    Color (UA POC) Yellow     Clarity (UA POC) Clear     Glucose (UA POC) Negative Negative    Bilirubin (UA POC) Negative Negative    Ketones (UA POC) Negative Negative    Specific gravity (UA POC) 1.025 1.001 - 1.035    Blood (UA POC) Negative Negative    pH (UA POC) 7.5 4.6 - 8.0    Protein (UA POC) Negative Negative    Urobilinogen (UA POC) 0.2 mg/dL 0.2 - 1    Nitrites (UA POC) Negative Negative    Leukocyte esterase (UA POC) Negative Negative        Urine was sent to [facility] for processing of urine culture.   [name] informed that it will be at least 48 hours before results are available     Orders Placed This Encounter   . CULTURE, URINE   . AMB POC URINALYSIS DIP STICK AUTO W/O MICRO   . docusate sodium (COLACE) 100 mg capsule     Sig: Take 100 mg by mouth.   . traZODone (DESYREL) 100 mg tablet     Sig: Take 1 Tab by mouth.          [name]

## 2018-12-16 LAB — CULTURE, URINE
Urine Culture, Routine: NO GROWTH
Urine Culture, Routine: NO GROWTH

## 2018-12-19 NOTE — Telephone Encounter (Addendum)
Called and there was no ans.Oda Kilts, LPN----- Message from Lina Sar, MD sent at 12/17/2018 11:10 PM EST -----  Neg CX - I guess this was done because he was having sx?

## 2018-12-20 NOTE — Telephone Encounter (Signed)
Letter sent that urine culture is negative. Oda Kilts, LPN

## 2018-12-28 ENCOUNTER — Ambulatory Visit: Attending: Nurse Practitioner | Primary: Family Medicine

## 2018-12-28 ENCOUNTER — Ambulatory Visit
Admit: 2018-12-28 | Discharge: 2018-12-28 | Payer: PRIVATE HEALTH INSURANCE | Attending: Nurse Practitioner | Primary: Family Medicine

## 2018-12-28 DIAGNOSIS — N401 Enlarged prostate with lower urinary tract symptoms: Secondary | ICD-10-CM

## 2018-12-28 DIAGNOSIS — N138 Other obstructive and reflux uropathy: Secondary | ICD-10-CM

## 2018-12-28 LAB — AMB POC URINALYSIS DIP STICK AUTO W/O MICRO
Bilirubin (UA POC): NEGATIVE
Bilirubin, Urine, POC: NEGATIVE
Blood (UA POC): NEGATIVE
Blood (UA POC): NEGATIVE
Glucose (UA POC): NEGATIVE
Glucose, Urine, POC: NEGATIVE
Ketones (UA POC): NEGATIVE
Ketones, Urine, POC: NEGATIVE
Leukocyte Esterase, Urine, POC: NEGATIVE
Leukocyte esterase (UA POC): NEGATIVE
Nitrite, Urine, POC: NEGATIVE
Nitrites (UA POC): NEGATIVE
Protein (UA POC): NEGATIVE
Protein, Urine, POC: NEGATIVE
Specific Gravity, Urine, POC: 1.005 NA (ref 1.001–1.035)
Specific gravity (UA POC): 1.005 (ref 1.001–1.035)
Urobilinogen (UA POC): 0.2 (ref 0.2–1)
Urobilinogen, POC: 0.2 (ref 0.2–1)
pH (UA POC): 6 (ref 4.6–8.0)
pH, Urine, POC: 6 NA (ref 4.6–8.0)

## 2018-12-28 LAB — AMB POC PVR, MEAS,POST-VOID RES,US,NON-IMAGING
PVR POC: 32 cc
PVR: 32 cc

## 2018-12-28 MED ORDER — SOLIFENACIN 5 MG TAB
5 mg | ORAL_TABLET | Freq: Every day | ORAL | 3 refills | Status: AC
Start: 2018-12-28 — End: ?

## 2018-12-28 MED ORDER — PHENAZOPYRIDINE 200 MG TAB
200 mg | ORAL_TABLET | Freq: Three times a day (TID) | ORAL | 0 refills | Status: AC | PRN
Start: 2018-12-28 — End: 2018-12-31

## 2018-12-28 NOTE — Progress Notes (Signed)
 Progress  Notes by Dustin Edwards B at 12/28/18 1300                Author: Iantha Edwards NOVAK  Service: --  [name] Minor       Filed: 12/28/18 1253  Encounter Date: 12/28/2018  Status: Signed          Editor: Bertrum Donnice BROCKS, NP (Nurse Practitioner)                    Dustin Edwards   DOB: 01-Apr-1966   Encounter date: 12/28/2018          ASSESSMENT:      Encounter Diagnoses              ICD-10-CM  ICD-9-CM          1.  BPH with obstruction/lower urinary tract symptoms  N40.1  600.01           N13.8  599.69          2.  Urinary frequency  R35.0  788.41          3.  Urinary urgency  R39.15  788.63             1. BPH with LUTS, specifically frequency, urgency and UUI. On Flomax 0.4 mg without benefit, started on Myrbetriq 50 mg 05/2018 with initial improvement but now no longer helpful    Cystoscopy 09/21/18: moderately enlarged lateral lobes, small mobile median lobe     TRUS volume 09/21/18: 59 cc    S/p Urolift 11/06/18 with persistent urinary frequency/urgency. PVR 32cc. AUA 14      2. History of elevated PSA of 5.41 ng/ml on 08/23/17, most recent 1.4 ng/ml on 10/13/17      PLAN:    - Reassured regarding urinary symptoms - should continue to improve. AUA 14   - Due to persistent frequency/urgency (unchanged from Pre Op), will trial Vesicare 5mg  daily   - Rx for pyridium for dysuria   - Follow up in 4 weeks            Chief Complaint       Patient presents with        ?  Benign Prostatic Hypertrophy             The patient reports Dysuria. Cystoscopy and UroLift (transprostatic implants #6). 11/07/19. PVR - 32mL.         ?  Dysuria        HISTORY OF PRESENT ILLNESS:  Dustin Edwards  is a 53 y.o. male who is seen  in follow up for BPH with LUTS.  At baseline, patient has  frequency, urgency,and UUI wearing 3-4 depends daily. Tried Flomax 0.4 mg without benefit. Started on Myrbetriq 50 mg in 05/2018 with initial improvement but now losing efficacy. Patient was last  seen in office 09/21/18 by Dr. Honore. Cystoscopy at  that time showed moderately enlarged lateral lobes, small mobile median lobe. TRUS volume was 59 cc.       He is s/p Urolift 11/06/18. He reports doing fair post operatively. Now on NO gu meds   Pt reports nocturia x3-4 (previoulsy 5-6x).    He continues to report bothersome urinary frequency and urgency. He reports UUI and wears about 2-3 pads daily. This is unchanged from pre op    He continues to report intermittent dysuria at night. Denies hematuria. Denies f/c/n.    Notes improved urinary flow      Drinks  about 3L water daily. 1 cup of coffee daily       He reports he is borderline diabetic.       Previously seen in 10/2017 with Dr Arlette for elevated PSA 5.410 on 08/23/2017 which increased from 2.0 on 03/25/2016. His most recent PSA 1.4 on 10/13/18      PMHx:   A1c 6.3 on 03/03/18    Takes HCTZ in the AM         Past Medical History:        Diagnosis  Date         ?  Anxiety       ?  BPH with obstruction/lower urinary tract symptoms       ?  Depression       ?  Elevated PSA       ?  Enlarged prostate with urinary obstruction       ?  Gall stones       ?  HTN (hypertension)       ?  Hypothyroidism       ?  Seizures (HCC)            says had one in 1990's         ?  Urinary frequency           ?  Urinary incontinence               Past Surgical History:         Procedure  Laterality  Date          ?  HX CHOLECYSTECTOMY         ?  HX COLONOSCOPY         ?  HX FRACTURE TX              right  lower leg          ?  HX GASTROSTOMY         ?  HX UROLOGICAL    11/06/2018          cysto and urolift (transprostatic implants #6), Dr. Honore HUMBLES             Social History          Tobacco Use         ?  Smoking status:  Never Smoker     ?  Smokeless tobacco:  Never Used       Substance Use Topics         ?  Alcohol use:  Never              Frequency:  Never         ?  Drug use:  Never             Allergies        Allergen  Reactions         ?  Penicillins  Hives and Itching     ?  Ibuprofen  Other (comments)              States he cannot take r/t hx of bleeding ulcers           History reviewed. No pertinent family history.        Current Outpatient Medications          Medication  Sig  Dispense  Refill           ?  solifenacin (VESICARE) 5 mg tablet  Take 1  Tab by mouth daily.  90 Tab  3     ?  phenazopyridine (PYRIDIUM) 200 mg tablet  Take 1 Tab by mouth three (3) times daily as needed for Pain for up to 3 days.  20 Tab  0     ?  docusate sodium (COLACE) 100 mg capsule  Take 100 mg by mouth.         ?  traZODone (DESYREL) 100 mg tablet  Take 1 Tab by mouth.         ?  mirabegron ER (MYRBETRIQ) 50 mg ER tablet  Take 1 Tab by mouth daily.  90 Tab  3     ?  buPROPion SR (WELLBUTRIN SR) 150 mg SR tablet  TAKE 1 TABLET BY MOUTH 1 TIME EVERY MORNING BEFORE NOON         ?  paliperidone palmitate (INVEGA SUSTENNA) 234 mg/1.5 mL injection  every three (3) months.         ?  ergocalciferol (ERGOCALCIFEROL) 50,000 unit capsule  Take 50,000 Units by mouth every seven (7) days.         ?  levothyroxine (SYNTHROID) 100 mcg tablet  TAKE 1 TABLET BY MOUTH EVERY DAY         ?  gemfibrozil (LOPID) 600 mg tablet  two (2) times a day.    3     ?  hydroCHLOROthiazide (MICROZIDE) 12.5 mg capsule  daily.    0     ?  cyclobenzaprine (FLEXERIL) 10 mg tablet  three (3) times daily as needed.    0     ?  sertraline (ZOLOFT) 100 mg tablet  100 mg daily.         ?  tamsulosin (FLOMAX) 0.4 mg capsule  daily.    3           ?  divalproex ER (DEPAKOTE ER) 500 mg ER tablet  two (2) times a day.    2           Review of Systems   Constitutional: Fever: No   Skin: Rash: No   HEENT: Hearing difficulty: No   Eyes: Blurred vision: No   Cardiovascular: Chest pain: No   Respiratory: Shortness of breath: No   Gastrointestinal: Nausea/vomiting: No   Musculoskeletal: Back pain: No   Neurological: Weakness: No   Psychological: Memory loss: No   Comments/additional findings:             PHYSICAL EXAMINATION:    Visit Vitals      BP  118/90     Ht  5' 8 (1.727 m)     Wt   255 lb (115.7 kg)        BMI  38.77 kg/m        Constitutional: WDWN. Pleasant and appropriate affect. No acute distress.     CV:  No peripheral swelling noted   Respiratory: No respiratory distress or difficulties    Abdomen:  No abdominal masses or tenderness. No CVA tenderness.    Neuro/Psych:  Alert and oriented x3, affect appropriate.    Lymphatic:   No enlarged supraclavicular lymph nodes.        Body mass index is 38.77 kg/m. Patient's BMI is out of the normal parameters.  Information about BMI was given and patient was advised to  follow-up with their PCP for further management.         REVIEW OF LABS AND IMAGING:  Results for orders placed or performed in visit on 12/28/18     AMB POC URINALYSIS DIP STICK AUTO W/O MICRO         Result  Value  Ref Range            Color (UA POC)  Yellow         Clarity (UA POC)  Clear         Glucose (UA POC)  Negative  Negative       Bilirubin (UA POC)  Negative  Negative       Ketones (UA POC)  Negative  Negative       Specific gravity (UA POC)  1.005  1.001 - 1.035       Blood (UA POC)  Negative  Negative       pH (UA POC)  6  4.6 - 8.0       Protein (UA POC)  Negative  Negative       Urobilinogen (UA POC)  0.2 mg/dL  0.2 - 1       Nitrites (UA POC)  Negative  Negative       Leukocyte esterase (UA POC)  Negative  Negative       AMB POC PVR, MEAS,POST-VOID RES,US ,NON-IMAGING         Result  Value  Ref Range            PVR  32  cc              A copy of today's office visit with all pertinent imaging results and labs were sent to the referring physician.        CC: Rober Huron, MD      Donnice Forte FNP-C   [facility]    [address]   [phone]          Medical Documentation is provided with the assistance of Damien KATHEE Durham, medical scribe for Donnice JAYSON Forte, NP

## 2018-12-28 NOTE — Progress Notes (Signed)
Dustin Edwards  DOB: Jul 08, 1966  Encounter date: 12/28/2018       ASSESSMENT:   Encounter Diagnoses     ICD-10-CM ICD-9-CM   1. BPH with obstruction/lower urinary tract symptoms N40.1 600.01    N13.8 599.69   2. Urinary frequency R35.0 788.41   3. Urinary urgency R39.15 788.63         1. BPH with LUTS, specifically frequency, urgency and UUI. On Flomax 0.4 mg without benefit, started on Myrbetriq 50 mg 05/2018 with initial improvement but now no longer helpful   Cystoscopy 09/21/18: moderately enlarged lateral lobes, small mobile median lobe    TRUS volume 09/21/18: 59 cc   S/p Urolift 11/06/18 with persistent urinary frequency/urgency. PVR 32cc. AUA 14    2. History of elevated PSA of 5.41 ng/ml on 08/23/17, most recent 1.4 ng/ml on 10/13/17    PLAN:   - Reassured regarding urinary symptoms - should continue to improve. AUA 14  - Due to persistent frequency/urgency (unchanged from Pre Op), will trial Vesicare 5mg  daily  - Rx for pyridium for dysuria  - Follow up in 4 weeks       Chief Complaint   Patient presents with   ??? Benign Prostatic Hypertrophy     The patient reports Dysuria. Cystoscopy and UroLift (transprostatic implants #6). 11/07/19. PVR - 14mL.    ??? Dysuria     HISTORY OF PRESENT ILLNESS:  Dustin Edwards is a 53 y.o. male who is seen in follow up for BPH with LUTS.  At baseline, patient has  frequency, urgency,and UUI wearing 3-4 depends daily. Tried Flomax 0.4 mg without benefit. Started on Myrbetriq 50 mg in 05/2018 with initial improvement but now losing efficacy. Patient was last seen in office 09/21/18 by Dr. Garlon Hatchet. Cystoscopy at that time showed moderately enlarged lateral lobes, small mobile median lobe. TRUS volume was 59 cc.     He is s/p Urolift 11/06/18. He reports doing fair post operatively. Now on NO gu meds  Pt reports nocturia x3-4 (previoulsy 5-6x).   He continues to report bothersome urinary frequency and urgency. He reports UUI and wears about 2-3 pads daily. This is unchanged from pre op    He continues to report intermittent dysuria at night. Denies hematuria. Denies f/c/n.   Notes improved urinary flow    Drinks about 3L water daily. 1 cup of coffee daily     He reports he is borderline diabetic.     Previously seen in 10/2017 with Dr Marcelle Smiling for elevated PSA 5.410 on 08/23/2017 which increased from 2.0 on 03/25/2016. His most recent PSA 1.4 on 10/13/18    PMHx:  A1c 6.3 on 03/03/18   Takes HCTZ in the AM   ??  Past Medical History:   Diagnosis Date   ??? Anxiety    ??? BPH with obstruction/lower urinary tract symptoms    ??? Depression    ??? Elevated PSA    ??? Enlarged prostate with urinary obstruction    ??? Gall stones    ??? HTN (hypertension)    ??? Hypothyroidism    ??? Seizures (HCC)     says had one in 1990's   ??? Urinary frequency    ??? Urinary incontinence        Past Surgical History:   Procedure Laterality Date   ??? HX CHOLECYSTECTOMY     ??? HX COLONOSCOPY     ??? HX FRACTURE TX      right  lower leg   ??? HX  GASTROSTOMY     ??? HX UROLOGICAL  11/06/2018    cysto and urolift (transprostatic implants #6), Dr. Sharla Kidney       Social History     Tobacco Use   ??? Smoking status: Never Smoker   ??? Smokeless tobacco: Never Used   Substance Use Topics   ??? Alcohol use: Never     Frequency: Never   ??? Drug use: Never       Allergies   Allergen Reactions   ??? Penicillins Hives and Itching   ??? Ibuprofen Other (comments)     States he cannot take r/t hx of bleeding ulcers       History reviewed. No pertinent family history.    Current Outpatient Medications   Medication Sig Dispense Refill   ??? solifenacin (VESICARE) 5 mg tablet Take 1 Tab by mouth daily. 90 Tab 3   ??? phenazopyridine (PYRIDIUM) 200 mg tablet Take 1 Tab by mouth three (3) times daily as needed for Pain for up to 3 days. 20 Tab 0   ??? docusate sodium (COLACE) 100 mg capsule Take 100 mg by mouth.     ??? traZODone (DESYREL) 100 mg tablet Take 1 Tab by mouth.     ??? mirabegron ER (MYRBETRIQ) 50 mg ER tablet Take 1 Tab by mouth daily. 90 Tab 3    ??? buPROPion SR (WELLBUTRIN SR) 150 mg SR tablet TAKE 1 TABLET BY MOUTH 1 TIME EVERY MORNING BEFORE NOON     ??? paliperidone palmitate (INVEGA SUSTENNA) 234 mg/1.5 mL injection every three (3) months.     ??? ergocalciferol (ERGOCALCIFEROL) 50,000 unit capsule Take 50,000 Units by mouth every seven (7) days.     ??? levothyroxine (SYNTHROID) 100 mcg tablet TAKE 1 TABLET BY MOUTH EVERY DAY     ??? gemfibrozil (LOPID) 600 mg tablet two (2) times a day.  3   ??? hydroCHLOROthiazide (MICROZIDE) 12.5 mg capsule daily.  0   ??? cyclobenzaprine (FLEXERIL) 10 mg tablet three (3) times daily as needed.  0   ??? sertraline (ZOLOFT) 100 mg tablet 100 mg daily.     ??? tamsulosin (FLOMAX) 0.4 mg capsule daily.  3   ??? divalproex ER (DEPAKOTE ER) 500 mg ER tablet two (2) times a day.  2       Review of Systems  Constitutional: Fever: No  Skin: Rash: No  HEENT: Hearing difficulty: No  Eyes: Blurred vision: No  Cardiovascular: Chest pain: No  Respiratory: Shortness of breath: No  Gastrointestinal: Nausea/vomiting: No  Musculoskeletal: Back pain: No  Neurological: Weakness: No  Psychological: Memory loss: No  Comments/additional findings:         PHYSICAL EXAMINATION:   Visit Vitals  BP 118/90   Ht 5\' 8"  (1.727 m)   Wt 255 lb (115.7 kg)   BMI 38.77 kg/m??     Constitutional: WDWN. Pleasant and appropriate affect. No acute distress.    CV:  No peripheral swelling noted  Respiratory: No respiratory distress or difficulties   Abdomen:  No abdominal masses or tenderness. No CVA tenderness.   Neuro/Psych:  Alert and oriented x3, affect appropriate.   Lymphatic:   No enlarged supraclavicular lymph nodes.      Body mass index is 38.77 kg/m??. Patient's BMI is out of the normal parameters.  Information about BMI was given and patient was advised to follow-up with their PCP for further management.      REVIEW OF LABS AND IMAGING:    Results for  orders placed or performed in visit on 12/28/18   AMB POC URINALYSIS DIP STICK AUTO W/O MICRO    Result Value Ref Range    Color (UA POC) Yellow     Clarity (UA POC) Clear     Glucose (UA POC) Negative Negative    Bilirubin (UA POC) Negative Negative    Ketones (UA POC) Negative Negative    Specific gravity (UA POC) 1.005 1.001 - 1.035    Blood (UA POC) Negative Negative    pH (UA POC) 6 4.6 - 8.0    Protein (UA POC) Negative Negative    Urobilinogen (UA POC) 0.2 mg/dL 0.2 - 1    Nitrites (UA POC) Negative Negative    Leukocyte esterase (UA POC) Negative Negative   AMB POC PVR, MEAS,POST-VOID RES,US,NON-IMAGING   Result Value Ref Range    PVR 32 cc         A copy of today's office visit with all pertinent imaging results and labs were sent to the referring physician.      CC: Jed LimerickMohan, Ravinder, MD    Moody BruinsMatthew Gilbert FNP-C  [facility]  [address]  [phone]       Medical Documentation is provided with the assistance of Stann MainlandEmily B Harrell, medical scribe for Delena ServeMatthew C Gilbert, NP

## 2019-02-02 ENCOUNTER — Encounter: Payer: PRIVATE HEALTH INSURANCE | Attending: Urology | Primary: Family Medicine

## 2019-02-02 NOTE — Telephone Encounter (Addendum)
Called and left msg for pt to call me back . Oda Kilts, LPN----- Message from [place] sent at 01/31/2019  1:26 PM EDT -----  I had to cancel this patient for Friday. He doesn't have the capability to do telehealth but I didn't feel comfortable just scheduling him out 10 weeks because he stated he's still having issues. Just fyi

## 2019-05-18 ENCOUNTER — Encounter: Payer: PRIVATE HEALTH INSURANCE | Attending: Urology | Primary: Family Medicine

## 2019-05-18 NOTE — Progress Notes (Deleted)
Dustin Edwards  DOB: 01/13/1966  Encounter date: 05/18/2019       ASSESSMENT:   No diagnosis found.      -BPH with LUTS, specifically frequency, urgency and UUI. On Flomax 0.4 mg without benefit, started on Myrbetriq 50 mg 05/2018 with initial improvement but now no longer helpful   Cystoscopy 09/21/18: moderately enlarged lateral lobes, small mobile median lobe    TRUS volume 09/21/18: 59 cc   S/p Urolift 11/06/18 with persistent urinary frequency/urgency. PVR 32cc. AUA 14    -History of elevated PSA of 5.41 ng/ml on 08/23/17, most recent 1.4 ng/ml on 10/13/17    PLAN:   - Reassured regarding urinary symptoms - should continue to improve. AUA 14  - Due to persistent frequency/urgency (unchanged from Pre Op), will trial Vesicare 5mg  daily  - Rx for pyridium for dysuria  - Follow up in 4 weeks       No chief complaint on file.    HISTORY OF PRESENT ILLNESS:  Dustin Edwards is a 53 y.o. male who is seen in follow up for BPH with LUTS.  At baseline, patient has  frequency, urgency,and UUI wearing 3-4 depends daily. Tried Flomax 0.4 mg without benefit. Started on Myrbetriq 50 mg in 05/2018 with initial improvement but now losing efficacy. Patient was last seen in office 09/21/18 by Dr. Garlon HatchetLangston. Cystoscopy at that time showed moderately enlarged lateral lobes, small mobile median lobe. TRUS volume was 59 cc.     He is s/p Urolift 11/06/18. He reports doing fair post operatively. Now on NO gu meds  Pt reports nocturia x3-4 (previoulsy 5-6x).   He continues to report bothersome urinary frequency and urgency. He reports UUI and wears about 2-3 pads daily. This is unchanged from pre op   He continues to report intermittent dysuria at night. Denies hematuria. Denies f/c/n.   Notes improved urinary flow    Drinks about 3L water daily. 1 cup of coffee daily     He reports he is borderline diabetic.     Previously seen in 10/2017 with Dr Marcelle Smilingosenfield for elevated PSA 5.410 on  08/23/2017 which increased from 2.0 on 03/25/2016. His most recent PSA 1.4 on 10/13/18    PMHx:  A1c 6.3 on 03/03/18   Takes HCTZ in the AM   ??  Past Medical History:   Diagnosis Date   ??? Anxiety    ??? BPH with obstruction/lower urinary tract symptoms    ??? Depression    ??? Elevated PSA    ??? Enlarged prostate with urinary obstruction    ??? Gall stones    ??? HTN (hypertension)    ??? Hypothyroidism    ??? Seizures (HCC)     says had one in 1990's   ??? Urinary frequency    ??? Urinary incontinence        Past Surgical History:   Procedure Laterality Date   ??? HX CHOLECYSTECTOMY     ??? HX COLONOSCOPY     ??? HX FRACTURE TX      right  lower leg   ??? HX GASTROSTOMY     ??? HX UROLOGICAL  11/06/2018    cysto and urolift (transprostatic implants #6), Dr. Sharla KidneyLangston SPAH       Social History     Tobacco Use   ??? Smoking status: Never Smoker   ??? Smokeless tobacco: Never Used   Substance Use Topics   ??? Alcohol use: Never     Frequency: Never   ??? Drug use:  Never       Allergies   Allergen Reactions   ??? Penicillins Hives and Itching   ??? Ibuprofen Other (comments)     States he cannot take r/t hx of bleeding ulcers       No family history on file.    Current Outpatient Medications   Medication Sig Dispense Refill   ??? solifenacin (VESICARE) 5 mg tablet Take 1 Tab by mouth daily. 90 Tab 3   ??? phenazopyridine (PYRIDIUM) 200 mg tablet Take 1 Tab by mouth three (3) times daily as needed for Pain for up to 3 days. 20 Tab 0   ??? docusate sodium (COLACE) 100 mg capsule Take 100 mg by mouth.     ??? traZODone (DESYREL) 100 mg tablet Take 1 Tab by mouth.     ??? mirabegron ER (MYRBETRIQ) 50 mg ER tablet Take 1 Tab by mouth daily. 90 Tab 3   ??? buPROPion SR (WELLBUTRIN SR) 150 mg SR tablet TAKE 1 TABLET BY MOUTH 1 TIME EVERY MORNING BEFORE NOON     ??? paliperidone palmitate (INVEGA SUSTENNA) 234 mg/1.5 mL injection every three (3) months.     ??? ergocalciferol (ERGOCALCIFEROL) 50,000 unit capsule Take 50,000 Units by mouth every seven (7) days.      ??? levothyroxine (SYNTHROID) 100 mcg tablet TAKE 1 TABLET BY MOUTH EVERY DAY     ??? gemfibrozil (LOPID) 600 mg tablet two (2) times a day.  3   ??? hydroCHLOROthiazide (MICROZIDE) 12.5 mg capsule daily.  0   ??? cyclobenzaprine (FLEXERIL) 10 mg tablet three (3) times daily as needed.  0   ??? sertraline (ZOLOFT) 100 mg tablet 100 mg daily.     ??? tamsulosin (FLOMAX) 0.4 mg capsule daily.  3   ??? divalproex ER (DEPAKOTE ER) 500 mg ER tablet two (2) times a day.  2       Review of Systems  Constitutional: Fever:   Skin: Rash:   HEENT: Hearing difficulty:   Eyes: Blurred vision:   Cardiovascular: Chest pain:   Respiratory: Shortness of breath:   Gastrointestinal: Nausea/vomiting:   Musculoskeletal: Back pain:   Neurological: Weakness:   Psychological: Memory loss:   Comments/additional findings:         PHYSICAL EXAMINATION:   There were no vitals taken for this visit.  Constitutional: WDWN. Pleasant and appropriate affect. No acute distress.    CV:  No peripheral swelling noted  Respiratory: No respiratory distress or difficulties   Abdomen:  No abdominal masses or tenderness. No CVA tenderness.   Neuro/Psych:  Alert and oriented x3, affect appropriate.   Lymphatic:   No enlarged supraclavicular lymph nodes.      There is no height or weight on file to calculate BMI. Patient's BMI is out of the normal parameters.  Information about BMI was given and patient was advised to follow-up with their PCP for further management.      REVIEW OF LABS AND IMAGING:    Results for orders placed or performed in visit on 12/28/18   AMB POC URINALYSIS DIP STICK AUTO W/O MICRO   Result Value Ref Range    Color (UA POC) Yellow     Clarity (UA POC) Clear     Glucose (UA POC) Negative Negative    Bilirubin (UA POC) Negative Negative    Ketones (UA POC) Negative Negative    Specific gravity (UA POC) 1.005 1.001 - 1.035    Blood (UA POC) Negative Negative    pH (  UA POC) 6 4.6 - 8.0    Protein (UA POC) Negative Negative     Urobilinogen (UA POC) 0.2 mg/dL 0.2 - 1    Nitrites (UA POC) Negative Negative    Leukocyte esterase (UA POC) Negative Negative   AMB POC PVR, MEAS,POST-VOID RES,US,NON-IMAGING   Result Value Ref Range    PVR 32 cc         A copy of today's office visit with all pertinent imaging results and labs were sent to the referring physician.      CC: Jed LimerickMohan, Ravinder, MD    Burtis JunesJoshua P. Garlon HatchetLangston, MD   [facility]  [address]  [place] [id number]  [phone] (office)     Medical documentation provided with the assistance of Erasmo LeventhalJacqueline A. Aston Lieske, medical scribe for CIT GroupJoshua P. Garlon HatchetLangston, MD.    Patient's BMI is out of the normal parameters.  Information about BMI was given and patient was advised to follow-up with their PCP for further management.

## 2020-02-26 HISTORY — PX: SPINAL FIXATION SURGERY: SHX1055

## 2020-08-25 ENCOUNTER — Other Ambulatory Visit: Payer: Self-pay | Admitting: Physician Assistant

## 2020-09-04 ENCOUNTER — Other Ambulatory Visit (HOSPITAL_COMMUNITY): Payer: Self-pay | Admitting: Physician Assistant

## 2020-09-04 ENCOUNTER — Other Ambulatory Visit: Payer: Self-pay | Admitting: Physician Assistant

## 2020-09-04 DIAGNOSIS — R591 Generalized enlarged lymph nodes: Secondary | ICD-10-CM

## 2020-09-05 ENCOUNTER — Other Ambulatory Visit: Payer: Self-pay | Admitting: Physician Assistant

## 2020-09-05 ENCOUNTER — Other Ambulatory Visit (HOSPITAL_COMMUNITY): Payer: Self-pay | Admitting: Physician Assistant

## 2020-09-05 DIAGNOSIS — R591 Generalized enlarged lymph nodes: Secondary | ICD-10-CM

## 2020-09-09 ENCOUNTER — Telehealth: Payer: Self-pay | Admitting: Hematology and Oncology

## 2020-09-09 NOTE — Telephone Encounter (Signed)
Received a new hem referral from [employer] for abnl PTT. Pt has been scheduled to see Dr. Lorenso Courier on 11/18 at 1pm. Referring office notified.

## 2020-09-23 ENCOUNTER — Other Ambulatory Visit: Payer: Self-pay

## 2020-09-23 ENCOUNTER — Ambulatory Visit (HOSPITAL_COMMUNITY)
Admission: RE | Admit: 2020-09-23 | Discharge: 2020-09-23 | Disposition: A | Discharge status: Home / Self Care | Payer: Medicaid Other | Source: Ambulatory Visit | Attending: Physician Assistant | Admitting: Physician Assistant

## 2020-09-23 DIAGNOSIS — R591 Generalized enlarged lymph nodes: Secondary | ICD-10-CM | POA: Insufficient documentation

## 2020-09-23 LAB — GLUCOSE, CAPILLARY: Glucose-Capillary: 123 mg/dL — ABNORMAL HIGH (ref 70–99)

## 2020-09-23 MED ORDER — FLUDEOXYGLUCOSE F - 18 (FDG) INJECTION
14.1300 | Freq: Once | INTRAVENOUS | Status: AC
  Administered 2020-09-23: 14.13 via INTRAVENOUS

## 2020-09-25 ENCOUNTER — Encounter: Payer: Self-pay | Admitting: Hematology and Oncology

## 2020-09-25 ENCOUNTER — Inpatient Hospital Stay: Payer: Medicaid Other

## 2020-09-25 ENCOUNTER — Inpatient Hospital Stay: Payer: Medicaid Other | Attending: Hematology and Oncology | Admitting: Hematology and Oncology

## 2020-09-25 ENCOUNTER — Other Ambulatory Visit: Payer: Self-pay

## 2020-09-25 VITALS — BP 104/88 | HR 123 | Temp 97.9°F | Resp 18 | Wt 281.3 lb

## 2020-09-25 DIAGNOSIS — R590 Localized enlarged lymph nodes: Secondary | ICD-10-CM | POA: Insufficient documentation

## 2020-09-25 DIAGNOSIS — Z809 Family history of malignant neoplasm, unspecified: Secondary | ICD-10-CM | POA: Insufficient documentation

## 2020-09-25 DIAGNOSIS — F17210 Nicotine dependence, cigarettes, uncomplicated: Secondary | ICD-10-CM | POA: Insufficient documentation

## 2020-09-25 DIAGNOSIS — N401 Enlarged prostate with lower urinary tract symptoms: Secondary | ICD-10-CM | POA: Insufficient documentation

## 2020-09-25 DIAGNOSIS — N400 Enlarged prostate without lower urinary tract symptoms: Secondary | ICD-10-CM | POA: Insufficient documentation

## 2020-09-25 DIAGNOSIS — R791 Abnormal coagulation profile: Secondary | ICD-10-CM

## 2020-09-25 DIAGNOSIS — F5101 Primary insomnia: Secondary | ICD-10-CM

## 2020-09-25 DIAGNOSIS — G4700 Insomnia, unspecified: Secondary | ICD-10-CM | POA: Insufficient documentation

## 2020-09-25 DIAGNOSIS — I10 Essential (primary) hypertension: Secondary | ICD-10-CM

## 2020-09-25 LAB — CMP (CANCER CENTER ONLY)
ALT: 61 U/L — ABNORMAL HIGH (ref 0–44)
AST: 36 U/L (ref 15–41)
Albumin: 4 g/dL (ref 3.5–5.0)
Alkaline Phosphatase: 122 U/L (ref 38–126)
Anion gap: 13 (ref 5–15)
BUN: 13 mg/dL (ref 6–20)
CO2: 24 mmol/L (ref 22–32)
Calcium: 9.8 mg/dL (ref 8.9–10.3)
Chloride: 101 mmol/L (ref 98–111)
Creatinine: 1.13 mg/dL (ref 0.61–1.24)
GFR, Estimated: >60 mL/min (ref >60)
Glucose, Bld: 118 mg/dL — ABNORMAL HIGH (ref 70–99)
Potassium: 4 mmol/L (ref 3.5–5.1)
Sodium: 138 mmol/L (ref 135–145)
Total Bilirubin: 0.3 mg/dL (ref 0.3–1.2)
Total Protein: 7.9 g/dL (ref 6.5–8.1)

## 2020-09-25 LAB — CBC WITH DIFFERENTIAL (CANCER CENTER ONLY)
Abs Immature Granulocytes: 0.04 10*3/uL (ref 0.00–0.07)
Basophils Absolute: 0 10*3/uL (ref 0.0–0.1)
Basophils Relative: 0 %
Eosinophils Absolute: 0.1 10*3/uL (ref 0.0–0.5)
Eosinophils Relative: 1 %
HCT: 46.6 % (ref 39.0–52.0)
Hemoglobin: 15.2 g/dL (ref 13.0–17.0)
Immature Granulocytes: 0 %
Lymphocytes Relative: 30 %
Lymphs Abs: 3 10*3/uL (ref 0.7–4.0)
MCH: 24.6 pg — ABNORMAL LOW (ref 26.0–34.0)
MCHC: 32.6 g/dL (ref 30.0–36.0)
MCV: 75.5 fL — ABNORMAL LOW (ref 80.0–100.0)
Monocytes Absolute: 0.6 10*3/uL (ref 0.1–1.0)
Monocytes Relative: 6 %
Neutro Abs: 6.1 10*3/uL (ref 1.7–7.7)
Neutrophils Relative %: 63 %
Platelet Count: 218 10*3/uL (ref 150–400)
RBC: 6.17 MIL/uL — ABNORMAL HIGH (ref 4.22–5.81)
RDW: 19.5 % — ABNORMAL HIGH (ref 11.5–15.5)
WBC Count: 9.9 10*3/uL (ref 4.0–10.5)
nRBC: 0 % (ref 0.0–0.2)

## 2020-09-25 LAB — PLATELET FUNCTION ASSAY: Collagen / Epinephrine: 154 seconds (ref 0–193)

## 2020-09-25 LAB — PROTIME-INR
INR: 1 (ref 0.8–1.2)
Prothrombin Time: 12.4 seconds (ref 11.4–15.2)

## 2020-09-25 LAB — APTT: aPTT: 29 seconds (ref 24–36)

## 2020-09-25 LAB — LACTATE DEHYDROGENASE: LDH: 222 U/L — ABNORMAL HIGH (ref 98–192)

## 2020-09-25 LAB — URIC ACID: Uric Acid, Serum: 9.3 mg/dL — ABNORMAL HIGH (ref 3.7–8.6)

## 2020-09-25 NOTE — Progress Notes (Signed)
[facility] [phone]   [phone]  INITIAL CONSULT NOTE  Patient Care Team: Patient, No Pcp Per as PCP - General (General Practice)  Hematological/Oncological History # Mediastinal Lymphadenopathy 1) 07/18/2020: CT PE study showed no PE, however there was noted to be mediastinal lymphadenopathy.  2) 09/23/2020: PET CT scan shows prevascular adenopathy with individual nodes measuring up to 2.4 cm in diameter, maximum SUV 6.4 (Deauville 4 activity). 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 Lorenso Courier   #Elevated PTT 1) 02/2020: patient developed an extensive hematoma following a decompression procedure with neurosurgery. Reported to have an elevated PTT at that time.  2) 02/26/2020: Pt 11.6, INR 0.98, aPTT 41.8  3) 02/27/2020: aPTT 34.4, Factor VIII 218, vWF activty 130%, Factor IX 211%, Factor XI 165% 4) 09/25/2020:establish care with Dr. Lorenso Courier   CHIEF COMPLAINTS/PURPOSE OF CONSULTATION:  "Mediastinal lymphadenopathy/Elevated PTT "  HISTORY OF PRESENTING ILLNESS:  Charles [name] 54 y.o. male with medical history significant for HTN, tachycardia, BPH, and insomnia who presents for evaluation of an elevated PTT and mediastinal lymphadenopathy.   On review of the previous records Charles [name] Charles [name] underwent a CT PE study on 07/18/2020 which noted that there was no pulmonary embolism, however there was noted to be some mediastinal lymphadenopathy.  On 09/13/2020 the patient underwent a PET CT scan which showed prevascular adenopathy with individual nodules measuring up to 2.4 cm in diameter with a maximum SUV of 6.4.  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  Of note the patient had a surgery performed on his back in April 2021.  He subsequently developed an extensive hematoma that required a decompression procedure by neurosurgery.  He was noted to have  an elevated PTT at that time, however subsequent labs showed a normalized PTT and individual factor levels.  Due to concern for this mediastinal lymph node and a possible coagulopathy the patient was referred to hematology for further evaluation and management.  On exam today Charles [name] does not have a particularly robust understanding of his medical situation.  He is accompanied by an aide from his facility.  He notes that he has not had any issues with weight loss recently, and is more likely been gaining weight.  He has had some sweats and notes that he has been feeling warmer despite the fact that the weather has been cold.  He does that he is occasionally has some chest pain and he does have bouts of coughing which he initially attributed to his smoking.  He notes that he does smoke approximately 1 pack/week and that he is not currently drink any alcohol or smoke anything else such as vape or recreational drugs.   On further discussion he notes that his family history is remarkable for hypertension hyperlipidemia in his mother and father and a brother who passed away from a car accident.  He notes that his sister is healthy and that he has 1 daughter and 1 granddaughter.  He currently denies having any issues with fevers, chills, nausea, vomiting, or diarrhea.  He denies having any dark stools or any other overt signs of blood loss of bleeding.  He notes he has never had any other surgical procedures.  A full 10 point ROS is listed below.  MEDICAL HISTORY:  Past Medical History:  Diagnosis Date  . Bipolar 1 disorder ([place])     SURGICAL HISTORY: Past Surgical  History:  Procedure Laterality Date  . SPINAL FIXATION SURGERY  02/26/2020    SOCIAL HISTORY: Social History   Socioeconomic History  . Marital status: Divorced    Spouse name: Not on file  . Number of children: 1  . Years of education: Not on file  . Highest education level: Not on file  Occupational History  . Not on file  Tobacco  Use  . Smoking status: Current Every Day Smoker    Packs/day: 0.25    Types: Cigarettes  Vaping Use  . Vaping Use: Never used  Substance and Sexual Activity  . Alcohol use: Not on file  . Drug use: Yes    Types: Cocaine    Comment: $800.00 in last 3 dyas  . Sexual activity: Not on file  Other Topics Concern  . Not on file  Social History Narrative  . Not on file   Social Determinants of Health   Financial Resource Strain:   . Difficulty of Paying Living Expenses: Not on file  Food Insecurity:   . Worried About Charity fundraiser in the Last Year: Not on file  . Ran Out of Food in the Last Year: Not on file  Transportation Needs:   . Lack of Transportation (Medical): Not on file  . Lack of Transportation (Non-Medical): Not on file  Physical Activity:   . Days of Exercise per Week: Not on file  . Minutes of Exercise per Session: Not on file  Stress:   . Feeling of Stress : Not on file  Social Connections:   . Frequency of Communication with Friends and Family: Not on file  . Frequency of Social Gatherings with Friends and Family: Not on file  . Attends Religious Services: Not on file  . Active Member of Clubs or Organizations: Not on file  . Attends Archivist Meetings: Not on file  . Marital Status: Not on file  Intimate Partner Violence:   . Fear of Current or Ex-Partner: Not on file  . Emotionally Abused: Not on file  . Physically Abused: Not on file  . Sexually Abused: Not on file    FAMILY HISTORY: Family History  Problem Relation Age of Onset  . Hypertension Mother   . Hyperlipidemia Mother   . Stroke Mother   . Cancer Mother   . Hypertension Father   . Hyperlipidemia Father   . Healthy Sister     ALLERGIES:  is allergic to ibuprofen and penicillins.  MEDICATIONS:  Current Outpatient Medications  Medication Sig Dispense Refill  . benztropine (COGENTIN) 1 MG tablet Take 1 mg by mouth 2 (two) times daily.      . divalproex (DEPAKOTE ER) 500  MG 24 hr tablet Take 1,500 mg by mouth daily.     . Paliperidone Palmitate (INVEGA SUSTENNA IM) Inject 1 pen into the muscle every 30 (thirty) days.       No current facility-administered medications for this visit.    REVIEW OF SYSTEMS:   Constitutional: ( - ) fevers, ( - )  chills , ( - ) night sweats Eyes: ( - ) blurriness of vision, ( - ) double vision, ( - ) watery eyes Ears, nose, mouth, throat, and face: ( - ) mucositis, ( - ) sore throat Respiratory: ( - ) cough, ( - ) dyspnea, ( - ) wheezes Cardiovascular: ( - ) palpitation, ( - ) chest discomfort, ( - ) lower extremity swelling Gastrointestinal:  ( - ) nausea, ( - )  heartburn, ( - ) change in bowel habits Skin: ( - ) abnormal skin rashes Lymphatics: ( - ) new lymphadenopathy, ( - ) easy bruising Neurological: ( - ) numbness, ( - ) tingling, ( - ) new weaknesses Behavioral/Psych: ( - ) mood change, ( - ) new changes  All other systems were reviewed with the patient and are negative.  PHYSICAL EXAMINATION: ECOG PERFORMANCE STATUS: 3 - Symptomatic, >50% confined to bed  Vitals:   09/25/20 1304  BP: 104/88  Pulse: (!) 123  Resp: 18  Temp: 97.9 F (36.6 C)  SpO2: 95%   Filed Weights   09/25/20 1304  Weight: 281 lb 4.8 oz (127.6 kg)    GENERAL: well appearing middle aged [place] American male in NAD  SKIN: skin color, texture, turgor are normal, no rashes or significant lesions EYES: conjunctiva are pink and non-injected, sclera clear  LUNGS: clear to auscultation and percussion with normal breathing effort HEART: regular rate & rhythm and no murmurs and no lower extremity edema ABDOMEN: exam limited 2/2 to body habitus Musculoskeletal: no cyanosis of digits and no clubbing  PSYCH: alert & oriented x 3, fluent speech NEURO: no focal motor/sensory deficits  LABORATORY DATA:  I have reviewed the data as listed CBC Latest Ref Rng & Units 11/09/2011 04/09/2011 10/13/2010  WBC 4.0 - 10.5 K/uL 9.4 10.8(H) 8.1  Hemoglobin  13.0 - 17.0 g/dL 15.2 14.6 13.9  Hematocrit 39 - 52 % 45.6 43.0 40.3  Platelets 150 - 400 K/uL 176 164 SPECIMEN CHECKED FOR CLOTS REPEATED TO VERIFY PLATELET COUNT CONFIRMED BY SMEAR LARGE PLATELETS PRESENT 141(L)    CMP Latest Ref Rng & Units 11/09/2011 04/09/2011 10/13/2010  Glucose 70 - 99 mg/dL 90 107(H) 107(H)  BUN 6 - 23 mg/dL 19 10 12   Creatinine 0.50 - 1.35 mg/dL 1.36(H) 0.98 0.98  Sodium 135 - 145 mEq/L 141 138 137  Potassium 3.5 - 5.1 mEq/L 4.1 3.6 4.4  Chloride 96 - 112 mEq/L 101 100 102  CO2 19 - 32 mEq/L 33(H) 25 27  Calcium 8.4 - 10.5 mg/dL 10.1 9.4 9.2  Total Protein 6.0 - 8.3 g/dL 8.0 8.3 6.5  Total Bilirubin 0.3 - 1.2 mg/dL 0.3 0.5 1.1  Alkaline Phos 39 - 117 U/L 81 92 67  AST 0 - 37 U/L 22 18 34  ALT 0 - 53 U/L 25 31 38    RADIOGRAPHIC STUDIES: NM PET Image Initial (PI) Skull Base To Thigh  Result Date: 09/23/2020 CLINICAL DATA:  Initial treatment strategy for thoracic adenopathy. EXAM: NUCLEAR MEDICINE PET SKULL BASE TO THIGH TECHNIQUE: 14.1 mCi F-18 FDG was injected intravenously. Full-ring PET imaging was performed from the skull base to thigh after the radiotracer. CT data was obtained and used for attenuation correction and anatomic localization. Fasting blood glucose: 123 mg/dl COMPARISON:  CT scans from 07/18/2020 FINDINGS: Mediastinal blood pool activity: SUV max 3.3 Liver activity: SUV max 3.9 NECK: There is misregistration of the PET data and the CT data in the neck region. I have carefully scrutinized the non attenuation corrected data as well. Relatively symmetric palatine tonsillar activity and glottic activity noted. This is likely physiologic. No hypermetabolic adenopathy is identified in the neck Incidental CT findings: none CHEST: Clustered prevascular lymph nodes are present, the largest 2.4 cm in short axis on image 69 of series 4 (stable from prior CT) with maximum SUV 6.4. Other thoracic lymph nodes are not pathologically enlarged or substantially  hypermetabolic. Incidental CT findings: Mild atherosclerotic calcification of  the aortic arch. ABDOMEN/PELVIS: No splenomegaly or abnormal splenic activity. Physiologic activity in bowel. Incidental CT findings: Hepatic steatosis. Cholecystectomy. 4 mm nonobstructive left kidney lower pole renal calculus. Postoperative findings along the anterior superior prostate. Prostatomegaly. SKELETON: No significant abnormal hypermetabolic activity in this region. Incidental CT findings: Remote posterior decompression in the thoracic spine at the T5 and T6 levels. IMPRESSION: 1. Prevascular adenopathy with individual nodes measuring up to 2.4 cm in diameter, maximum SUV 6.4 (Deauville 4 activity). 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 No adenopathy or suspicious hypermetabolic activity in the neck, abdomen/pelvis, or skeleton. 2. Hepatic steatosis. 3. Nonobstructive left nephrolithiasis. 4. Prostatomegaly. 5.  Aortic Atherosclerosis (ICD10-I70.0). Electronically Signed   By: Van Clines M.D.   On: 09/23/2020 11:59    ASSESSMENT & PLAN Charles [name] 54 y.o. male with medical history significant for HTN, tachycardia, BPH, and insomnia who presents for evaluation of an elevated PTT and mediastinal lymphadenopathy.  After review the labs, the records, discussion with the patient the findings are most consistent with mediastinal lymphadenopathy and elevated PTT.  The mediastinal lymphadenopathy was found incidentally during evaluation on concern for a pulmonary embolism.  The lesion is hypermetabolic on PET CT scan and would likely be most amenable to biopsy via mediastinoscopy.  He is not having any other symptoms concerning for lymphoma or other malignancy, lymph nodes not particularly large.  Additionally there is concern for coagulopathy given his prior surgery in April 2021 at which time he developed a hematoma that required evacuation.  He was noted to have an elevated PTT at the  time, though later in the hospitalization his PTT normalized.  I do believe this was likely be transient, however with an upcoming possible mediastinoscopy I would recommend that we perform a coagulation work-up to assure no clotting disorders.  Mr. Toren voiced his understanding of this plan moving forward.  # Mediastinal Lymphadenopathy -- findings concerning for a malignancy, mediastinal lymphoma vs lung cancer -- will order LDH, flow cytometry --referral to CT surgery for consideration of mediastinoscopy to obtain tissue for diagnosis --recommend on holding procedure until coagulation issue is clarified (noted below). --placeholder RTC in 6 week, will schedule patient sooner if biopsy is performed and results are available.   #Elevated PTT -- order new PT, PTT, and INR today --additional coagulation studies to include thrombin time and PFA --appears to have resolved while he was in house in April 2021  --may be transient, labs today will help clarify   Orders Placed This Encounter  Procedures  . CBC with Differential ([facility]cer Center Only)    Standing Status:   Future    Standing Expiration Date:   09/25/2021  . CMP ([facility] only)    Standing Status:   Future    Standing Expiration Date:   09/25/2021  . Lactate dehydrogenase (LDH)    Standing Status:   Future    Standing Expiration Date:   09/25/2021  . Flow Cytometry    Standing Status:   Future    Standing Expiration Date:   09/25/2021  . Uric acid    Standing Status:   Future    Standing Expiration Date:   09/25/2021  . Thrombin time    Standing Status:   Future    Standing Expiration Date:   09/25/2021  . APTT    Standing Status:   Future    Standing Expiration Date:   09/25/2021  . Protime-INR    Standing Status:   Future  Standing Expiration Date:   09/25/2021  . Platelet function assay    Standing Status:   Future    Standing Expiration Date:   09/25/2021  . Ambulatory referral to Cardiothoracic Surgery     Referral Priority:   Routine    Referral Type:   Surgical    Referral Reason:   Specialty Services Required    Referred to Provider:   Melrose Nakayama, MD    Requested Specialty:   Cardiothoracic Surgery    Number of Visits Requested:   1    All questions were answered. The patient knows to call the clinic with any problems, questions or concerns.  A total of more than 60 minutes were spent on this encounter and over half of that time was spent on counseling and coordination of care as outlined above.   Ledell Peoples, MD Department of Hematology/Oncology [place] [facility] Phone: [phone] Pager: [phone] Email: [email or web address]  09/25/2020 2:07 PM

## 2020-09-26 LAB — SURGICAL PATHOLOGY

## 2020-09-27 LAB — THROMBIN TIME: Thrombin Time: 20.9 s (ref 0.0–23.0)

## 2020-09-29 LAB — FLOW CYTOMETRY

## 2020-09-30 ENCOUNTER — Encounter: Payer: Self-pay | Admitting: Emergency Medicine

## 2020-10-01 ENCOUNTER — Telehealth: Payer: Self-pay | Admitting: Hematology and Oncology

## 2020-10-01 NOTE — Telephone Encounter (Signed)
Scheduled per los. Called and spoke with nurse at facility. Confirmed date and time. Will call back if appt needs to be changed

## 2020-10-08 ENCOUNTER — Encounter: Payer: Medicaid Other | Admitting: Thoracic Surgery (Cardiothoracic Vascular Surgery)

## 2020-10-08 ENCOUNTER — Telehealth: Payer: Self-pay | Admitting: *Deleted

## 2020-10-08 DIAGNOSIS — U071 COVID-19: Secondary | ICD-10-CM

## 2020-10-08 HISTORY — DX: COVID-19: U07.1

## 2020-10-08 NOTE — Telephone Encounter (Signed)
Mr Heffron called for an update of status. MD at his facility is requesting an update.

## 2020-10-10 ENCOUNTER — Encounter (HOSPITAL_COMMUNITY): Payer: Self-pay

## 2020-10-10 ENCOUNTER — Inpatient Hospital Stay (HOSPITAL_COMMUNITY)
Admission: EM | Admit: 2020-10-10 | Discharge: 2020-10-23 | DRG: 286 | Disposition: A | Discharge status: Nursing Home (Includes ICF) | Payer: Medicaid Other | Source: Skilled Nursing Facility | Attending: Internal Medicine | Admitting: Internal Medicine

## 2020-10-10 ENCOUNTER — Emergency Department (HOSPITAL_COMMUNITY): Payer: Medicaid Other

## 2020-10-10 ENCOUNTER — Other Ambulatory Visit: Payer: Self-pay

## 2020-10-10 DIAGNOSIS — Z8249 Family history of ischemic heart disease and other diseases of the circulatory system: Secondary | ICD-10-CM

## 2020-10-10 DIAGNOSIS — I5023 Acute on chronic systolic (congestive) heart failure: Secondary | ICD-10-CM

## 2020-10-10 DIAGNOSIS — I5021 Acute systolic (congestive) heart failure: Secondary | ICD-10-CM

## 2020-10-10 DIAGNOSIS — R590 Localized enlarged lymph nodes: Secondary | ICD-10-CM | POA: Diagnosis present

## 2020-10-10 DIAGNOSIS — I161 Hypertensive emergency: Secondary | ICD-10-CM

## 2020-10-10 DIAGNOSIS — K5900 Constipation, unspecified: Secondary | ICD-10-CM | POA: Diagnosis present

## 2020-10-10 DIAGNOSIS — I428 Other cardiomyopathies: Secondary | ICD-10-CM | POA: Diagnosis present

## 2020-10-10 DIAGNOSIS — R000 Tachycardia, unspecified: Secondary | ICD-10-CM | POA: Diagnosis present

## 2020-10-10 DIAGNOSIS — R61 Generalized hyperhidrosis: Secondary | ICD-10-CM | POA: Diagnosis present

## 2020-10-10 DIAGNOSIS — C8590 Non-Hodgkin lymphoma, unspecified, unspecified site: Secondary | ICD-10-CM | POA: Diagnosis present

## 2020-10-10 DIAGNOSIS — Z880 Allergy status to penicillin: Secondary | ICD-10-CM

## 2020-10-10 DIAGNOSIS — J9601 Acute respiratory failure with hypoxia: Secondary | ICD-10-CM | POA: Diagnosis present

## 2020-10-10 DIAGNOSIS — E039 Hypothyroidism, unspecified: Secondary | ICD-10-CM | POA: Diagnosis present

## 2020-10-10 DIAGNOSIS — F17210 Nicotine dependence, cigarettes, uncomplicated: Secondary | ICD-10-CM | POA: Diagnosis present

## 2020-10-10 DIAGNOSIS — N400 Enlarged prostate without lower urinary tract symptoms: Secondary | ICD-10-CM | POA: Diagnosis present

## 2020-10-10 DIAGNOSIS — R140 Abdominal distension (gaseous): Secondary | ICD-10-CM

## 2020-10-10 DIAGNOSIS — R0600 Dyspnea, unspecified: Secondary | ICD-10-CM

## 2020-10-10 DIAGNOSIS — R0602 Shortness of breath: Secondary | ICD-10-CM | POA: Diagnosis present

## 2020-10-10 DIAGNOSIS — E871 Hypo-osmolality and hyponatremia: Secondary | ICD-10-CM | POA: Diagnosis present

## 2020-10-10 DIAGNOSIS — Z20822 Contact with and (suspected) exposure to covid-19: Secondary | ICD-10-CM | POA: Diagnosis present

## 2020-10-10 DIAGNOSIS — F319 Bipolar disorder, unspecified: Secondary | ICD-10-CM | POA: Diagnosis present

## 2020-10-10 DIAGNOSIS — Z7984 Long term (current) use of oral hypoglycemic drugs: Secondary | ICD-10-CM

## 2020-10-10 DIAGNOSIS — E119 Type 2 diabetes mellitus without complications: Secondary | ICD-10-CM

## 2020-10-10 DIAGNOSIS — E1165 Type 2 diabetes mellitus with hyperglycemia: Secondary | ICD-10-CM | POA: Diagnosis present

## 2020-10-10 DIAGNOSIS — R188 Other ascites: Secondary | ICD-10-CM

## 2020-10-10 DIAGNOSIS — I110 Hypertensive heart disease with heart failure: Principal | ICD-10-CM | POA: Diagnosis present

## 2020-10-10 DIAGNOSIS — Z6841 Body Mass Index (BMI) 40.0 and over, adult: Secondary | ICD-10-CM

## 2020-10-10 DIAGNOSIS — I10 Essential (primary) hypertension: Secondary | ICD-10-CM | POA: Diagnosis present

## 2020-10-10 DIAGNOSIS — R0789 Other chest pain: Secondary | ICD-10-CM | POA: Diagnosis not present

## 2020-10-10 DIAGNOSIS — C8190 Hodgkin lymphoma, unspecified, unspecified site: Secondary | ICD-10-CM | POA: Diagnosis present

## 2020-10-10 DIAGNOSIS — F200 Paranoid schizophrenia: Secondary | ICD-10-CM | POA: Diagnosis present

## 2020-10-10 DIAGNOSIS — C8102 Nodular lymphocyte predominant Hodgkin lymphoma, intrathoracic lymph nodes: Secondary | ICD-10-CM | POA: Diagnosis present

## 2020-10-10 DIAGNOSIS — G4700 Insomnia, unspecified: Secondary | ICD-10-CM | POA: Diagnosis present

## 2020-10-10 DIAGNOSIS — Z79890 Hormone replacement therapy (postmenopausal): Secondary | ICD-10-CM

## 2020-10-10 DIAGNOSIS — G40909 Epilepsy, unspecified, not intractable, without status epilepticus: Secondary | ICD-10-CM | POA: Diagnosis present

## 2020-10-10 DIAGNOSIS — R946 Abnormal results of thyroid function studies: Secondary | ICD-10-CM | POA: Diagnosis present

## 2020-10-10 DIAGNOSIS — E785 Hyperlipidemia, unspecified: Secondary | ICD-10-CM | POA: Diagnosis present

## 2020-10-10 DIAGNOSIS — I5041 Acute combined systolic (congestive) and diastolic (congestive) heart failure: Secondary | ICD-10-CM | POA: Diagnosis present

## 2020-10-10 DIAGNOSIS — E1110 Type 2 diabetes mellitus with ketoacidosis without coma: Secondary | ICD-10-CM

## 2020-10-10 DIAGNOSIS — Z79899 Other long term (current) drug therapy: Secondary | ICD-10-CM

## 2020-10-10 DIAGNOSIS — Z886 Allergy status to analgesic agent status: Secondary | ICD-10-CM

## 2020-10-10 HISTORY — DX: Essential (primary) hypertension: I10

## 2020-10-10 HISTORY — DX: Type 2 diabetes mellitus without complications: E11.9

## 2020-10-10 LAB — CBC WITH DIFFERENTIAL/PLATELET
Abs Immature Granulocytes: 0.05 10*3/uL (ref 0.00–0.07)
Basophils Absolute: 0.1 10*3/uL (ref 0.0–0.1)
Basophils Relative: 1 %
Eosinophils Absolute: 0.2 10*3/uL (ref 0.0–0.5)
Eosinophils Relative: 2 %
HCT: 46.7 % (ref 39.0–52.0)
Hemoglobin: 15.1 g/dL (ref 13.0–17.0)
Immature Granulocytes: 0 %
Lymphocytes Relative: 33 %
Lymphs Abs: 3.8 10*3/uL (ref 0.7–4.0)
MCH: 25 pg — ABNORMAL LOW (ref 26.0–34.0)
MCHC: 32.3 g/dL (ref 30.0–36.0)
MCV: 77.4 fL — ABNORMAL LOW (ref 80.0–100.0)
Monocytes Absolute: 0.9 10*3/uL (ref 0.1–1.0)
Monocytes Relative: 8 %
Neutro Abs: 6.6 10*3/uL (ref 1.7–7.7)
Neutrophils Relative %: 56 %
Platelets: 204 10*3/uL (ref 150–400)
RBC: 6.03 MIL/uL — ABNORMAL HIGH (ref 4.22–5.81)
RDW: 19.3 % — ABNORMAL HIGH (ref 11.5–15.5)
WBC: 11.6 10*3/uL — ABNORMAL HIGH (ref 4.0–10.5)
nRBC: 0 % (ref 0.0–0.2)

## 2020-10-10 LAB — VALPROIC ACID LEVEL: Valproic Acid Lvl: <10 ug/mL — ABNORMAL LOW (ref 50.0–100.0)

## 2020-10-10 LAB — BASIC METABOLIC PANEL
Anion gap: 13 (ref 5–15)
Anion gap: 12 (ref 5–15)
BUN: 16 mg/dL (ref 6–20)
BUN: 16 mg/dL (ref 6–20)
CO2: 21 mmol/L — ABNORMAL LOW (ref 22–32)
CO2: 24 mmol/L (ref 22–32)
Calcium: 9.1 mg/dL (ref 8.9–10.3)
Calcium: 9 mg/dL (ref 8.9–10.3)
Chloride: 99 mmol/L (ref 98–111)
Chloride: 99 mmol/L (ref 98–111)
Creatinine, Ser: 1.31 mg/dL — ABNORMAL HIGH (ref 0.61–1.24)
Creatinine, Ser: 1.08 mg/dL (ref 0.61–1.24)
GFR, Estimated: >60 mL/min (ref >60)
GFR, Estimated: >60 mL/min (ref >60)
Glucose, Bld: 124 mg/dL — ABNORMAL HIGH (ref 70–99)
Glucose, Bld: 131 mg/dL — ABNORMAL HIGH (ref 70–99)
Potassium: 6.3 mmol/L (ref 3.5–5.1)
Potassium: 4.1 mmol/L (ref 3.5–5.1)
Sodium: 133 mmol/L — ABNORMAL LOW (ref 135–145)
Sodium: 135 mmol/L (ref 135–145)

## 2020-10-10 LAB — BRAIN NATRIURETIC PEPTIDE: B Natriuretic Peptide: 97.4 pg/mL (ref 0.0–100.0)

## 2020-10-10 LAB — RESP PANEL BY RT-PCR (FLU A&B, COVID) ARPGX2
Influenza A by PCR: NEGATIVE
Influenza B by PCR: NEGATIVE
SARS Coronavirus 2 by RT PCR: NEGATIVE

## 2020-10-10 LAB — PROTIME-INR
INR: 1 (ref 0.8–1.2)
Prothrombin Time: 12.5 seconds (ref 11.4–15.2)

## 2020-10-10 MED ORDER — IOHEXOL 350 MG/ML SOLN
100.0000 mL | Freq: Once | INTRAVENOUS | Status: AC | PRN
  Administered 2020-10-10: 100 mL via INTRAVENOUS

## 2020-10-10 NOTE — ED Notes (Signed)
Date and time results received: 10/10/20 10:09 PM  (use smartphrase ".now" to insert current time)  Test: potassium Critical Value: 6.3  Name of Provider Notified: [place]  Orders Received? Or Actions Taken?: Orders Received - See Orders for details

## 2020-10-10 NOTE — ED Triage Notes (Signed)
Patient BIB [facility] with a 2 week history of increased shortness of breath with exertion and a productive cough. [name] from [facility] facility. Diminished bilaterally in lower lobes. Sats 92% Room Air and 97% on 2L.   EMS vitals 136/96 HR 122 RR 18 CBG 134

## 2020-10-10 NOTE — ED Provider Notes (Signed)
[facility] DEPT Provider Note   CSN: [id number] Arrival date & time: 10/10/20  2044     History Chief Complaint  Patient presents with  . Shortness of Breath    Charles [name] is a 54 y.o. male.  Pt presents to the ED today with sob.  He has been having more sob for the past 2 weeks.  He has been recently diagnosed with mediastinal lymphadenopathy.  He did see Dr. Lorenso Courier (oncology) on 11/18.  He referred pt to CTS for consultation for consideration of mediastinoscopy to obtain tissue for dx.  The appt has been scheduled, but he has not yet gone.  The pt's RA O2 sats were 92%.  The pt was put on 2L oxygen and sats are up to 97%.  The pt denies any f/c.  He has been covid vaccinated.          Past Medical History:  Diagnosis Date  . Bipolar 1 disorder [facility])     Patient Active Problem List   Diagnosis Date Noted  . Hypertension 09/25/2020  . BPH (benign prostatic hyperplasia) 09/25/2020  . Insomnia 09/25/2020    Past Surgical History:  Procedure Laterality Date  . SPINAL FIXATION SURGERY  02/26/2020       Family History  Problem Relation Age of Onset  . Hypertension Mother   . Hyperlipidemia Mother   . Stroke Mother   . Cancer Mother   . Hypertension Father   . Hyperlipidemia Father   . Healthy Sister     Social History   Tobacco Use  . Smoking status: Current Every Day Smoker    Packs/day: 0.25    Types: Cigarettes  . Smokeless tobacco: Never Used  Vaping Use  . Vaping Use: Never used  Substance Use Topics  . Alcohol use: Not on file  . Drug use: Yes    Types: Cocaine    Comment: $800.00 in last 3 dyas    Home Medications Prior to Admission medications   Medication Sig Start Date End Date Taking? Authorizing Provider  acetaminophen (TYLENOL) 325 MG tablet Take 650 mg by mouth every 6 (six) hours as needed.    [provider]  benztropine (COGENTIN) 1 MG tablet Take 1 mg by mouth 2 (two) times daily.       [provider]  brimonidine (ALPHAGAN) 0.2 % ophthalmic solution 3 (three) times daily.    [provider]  cyclobenzaprine (FLEXERIL) 10 MG tablet Take 10 mg by mouth 3 (three) times daily.    [provider]  divalproex (DEPAKOTE ER) 500 MG 24 hr tablet Take 1,500 mg by mouth daily.     [provider]  hydrochlorothiazide (HYDRODIURIL) 25 MG tablet Take 25 mg by mouth daily.    [provider]  latanoprost (XALATAN) 0.005 % ophthalmic solution 1 drop at bedtime.    [provider]  levothyroxine (SYNTHROID) 100 MCG tablet Take 100 mcg by mouth daily before breakfast.    [provider]  lisinopril (ZESTRIL) 2.5 MG tablet Take 2.5 mg by mouth daily.    [provider]  Melatonin 3 MG CAPS Take by mouth.    [provider]  metFORMIN (GLUCOPHAGE) 500 MG tablet Take 500 mg by mouth 2 (two) times daily with a meal.    [provider]  Paliperidone Palmitate (INVEGA SUSTENNA IM) Inject 1 pen into the muscle every 30 (thirty) days.      [provider]  risperiDONE (RISPERDAL) 0.5  MG tablet Take 0.5 mg by mouth at bedtime.    [provider]    Allergies    Ibuprofen and Penicillins  Review of Systems   Review of Systems  Respiratory: Positive for shortness of breath.   All other systems reviewed and are negative.   Physical Exam Updated Vital Signs BP (!) 128/100   Pulse (!) 118   Temp 98.3 F (36.8 C) (Oral)   Resp (!) 29   Ht 5\' 9"  (1.753 m)   Wt 127.6 kg   SpO2 96%   BMI 41.54 kg/m   Physical Exam Vitals and nursing note reviewed.  Constitutional:      Appearance: He is well-developed.  HENT:     Head: Normocephalic and atraumatic.     Mouth/Throat:     Mouth: Mucous membranes are moist.     Pharynx: Oropharynx is clear.  Eyes:     Extraocular Movements: Extraocular movements intact.     Pupils: Pupils are equal, round, and reactive to light.  Cardiovascular:      Rate and Rhythm: Regular rhythm. Tachycardia present.  Pulmonary:     Effort: Tachypnea present.     Breath sounds: Rhonchi present.  Abdominal:     General: Bowel sounds are normal.     Palpations: Abdomen is soft.  Musculoskeletal:        General: Normal range of motion.     Cervical back: Normal range of motion and neck supple.     Right lower leg: Edema present.     Left lower leg: Edema present.  Skin:    General: Skin is warm.     Capillary Refill: Capillary refill takes less than 2 seconds.  Neurological:     General: No focal deficit present.     Mental Status: He is alert and oriented to person, place, and time.  Psychiatric:        Mood and Affect: Mood normal.        Behavior: Behavior normal.     ED Results / Procedures / Treatments   Labs (all labs ordered are listed, but only abnormal results are displayed) Labs Reviewed  BASIC METABOLIC PANEL - Abnormal; Notable for the following components:      Result Value   Sodium 133 (*)    Potassium 6.3 (*)    CO2 21 (*)    Glucose, Bld 124 (*)    Creatinine, Ser 1.31 (*)    All other components within normal limits  CBC WITH DIFFERENTIAL/PLATELET - Abnormal; Notable for the following components:   WBC 11.6 (*)    RBC 6.03 (*)    MCV 77.4 (*)    MCH 25.0 (*)    RDW 19.3 (*)    All other components within normal limits  RESP PANEL BY RT-PCR (FLU A&B, COVID) [id number]  BRAIN NATRIURETIC PEPTIDE  PROTIME-INR  BASIC METABOLIC PANEL    EKG EKG Interpretation  Date/Time:  Friday October 10 2020 20:58:49 EST Ventricular Rate:  120 PR Interval:    QRS Duration: 106 QT Interval:  339 QTC Calculation: 479 R Axis:   -81 Text Interpretation: Sinus tachycardia LAE, consider biatrial enlargement LAD, consider left anterior fascicular block Borderline T wave abnormalities Borderline prolonged QT interval Since last tracing rate faster Confirmed by Isla Pence [phone]) on 10/10/2020 9:34:03 PM   Radiology DG  Chest Port 1 View  Result Date: 10/10/2020 CLINICAL DATA:  54 year old male with shortness of breath. EXAM: PORTABLE CHEST 1 VIEW COMPARISON:  Chest  radiograph dated 01/06/2007. FINDINGS: Cardiomegaly with vascular congestion. No focal consolidation, pleural effusion or pneumothorax. No acute osseous pathology. IMPRESSION: Cardiomegaly with vascular congestion. No focal consolidation. Electronically Signed   By: Anner Crete M.D.   On: 10/10/2020 21:20    Procedures Procedures (including critical care time)  Medications Ordered in ED Medications  iohexol (OMNIPAQUE) 350 MG/ML injection 100 mL (100 mLs Intravenous Contrast Given 10/10/20 2235)    ED Course  I have reviewed the triage vital signs and the nursing notes.  Pertinent labs & imaging results that were available during my care of the patient were reviewed by me and considered in my medical decision making (see chart for details).    MDM Rules/Calculators/A&P                          Pt kept on 2L oxygen.  CT chest pending.   Pt's K is 6.3, but it is hemolyzed.  BMP is repeated.  Covid is pending, but he's been vaccinated.  Pt signed out to Dr. Christy Gentles at shift change.   Final Clinical Impression(s) / ED Diagnoses Final diagnoses:  Dyspnea, unspecified type    Rx / DC Orders ED Discharge Orders    None       Isla Pence, MD 10/11/20 1625

## 2020-10-10 NOTE — ED Provider Notes (Signed)
Plan at signout is to f/u on CT imaging/labs and reassess   Ripley Fraise, MD 10/10/20 2310

## 2020-10-11 ENCOUNTER — Encounter (HOSPITAL_COMMUNITY): Payer: Self-pay | Admitting: Internal Medicine

## 2020-10-11 ENCOUNTER — Observation Stay (HOSPITAL_COMMUNITY): Payer: Medicaid Other

## 2020-10-11 ENCOUNTER — Inpatient Hospital Stay (HOSPITAL_COMMUNITY): Payer: Medicaid Other

## 2020-10-11 DIAGNOSIS — R140 Abdominal distension (gaseous): Secondary | ICD-10-CM | POA: Diagnosis not present

## 2020-10-11 DIAGNOSIS — Z8249 Family history of ischemic heart disease and other diseases of the circulatory system: Secondary | ICD-10-CM | POA: Diagnosis not present

## 2020-10-11 DIAGNOSIS — R000 Tachycardia, unspecified: Secondary | ICD-10-CM | POA: Diagnosis not present

## 2020-10-11 DIAGNOSIS — E039 Hypothyroidism, unspecified: Secondary | ICD-10-CM | POA: Diagnosis present

## 2020-10-11 DIAGNOSIS — Z79890 Hormone replacement therapy (postmenopausal): Secondary | ICD-10-CM | POA: Diagnosis not present

## 2020-10-11 DIAGNOSIS — C8590 Non-Hodgkin lymphoma, unspecified, unspecified site: Secondary | ICD-10-CM | POA: Diagnosis present

## 2020-10-11 DIAGNOSIS — Z20822 Contact with and (suspected) exposure to covid-19: Secondary | ICD-10-CM | POA: Diagnosis present

## 2020-10-11 DIAGNOSIS — K5900 Constipation, unspecified: Secondary | ICD-10-CM | POA: Diagnosis present

## 2020-10-11 DIAGNOSIS — R590 Localized enlarged lymph nodes: Secondary | ICD-10-CM | POA: Diagnosis present

## 2020-10-11 DIAGNOSIS — J9601 Acute respiratory failure with hypoxia: Secondary | ICD-10-CM | POA: Diagnosis present

## 2020-10-11 DIAGNOSIS — G4700 Insomnia, unspecified: Secondary | ICD-10-CM | POA: Diagnosis present

## 2020-10-11 DIAGNOSIS — I10 Essential (primary) hypertension: Secondary | ICD-10-CM | POA: Diagnosis not present

## 2020-10-11 DIAGNOSIS — R0602 Shortness of breath: Secondary | ICD-10-CM | POA: Diagnosis present

## 2020-10-11 DIAGNOSIS — I2510 Atherosclerotic heart disease of native coronary artery without angina pectoris: Secondary | ICD-10-CM | POA: Diagnosis not present

## 2020-10-11 DIAGNOSIS — R946 Abnormal results of thyroid function studies: Secondary | ICD-10-CM | POA: Diagnosis present

## 2020-10-11 DIAGNOSIS — C8102 Nodular lymphocyte predominant Hodgkin lymphoma, intrathoracic lymph nodes: Secondary | ICD-10-CM | POA: Diagnosis present

## 2020-10-11 DIAGNOSIS — E1165 Type 2 diabetes mellitus with hyperglycemia: Secondary | ICD-10-CM | POA: Diagnosis present

## 2020-10-11 DIAGNOSIS — E119 Type 2 diabetes mellitus without complications: Secondary | ICD-10-CM | POA: Diagnosis not present

## 2020-10-11 DIAGNOSIS — G40909 Epilepsy, unspecified, not intractable, without status epilepticus: Secondary | ICD-10-CM | POA: Diagnosis present

## 2020-10-11 DIAGNOSIS — I429 Cardiomyopathy, unspecified: Secondary | ICD-10-CM | POA: Diagnosis not present

## 2020-10-11 DIAGNOSIS — F17210 Nicotine dependence, cigarettes, uncomplicated: Secondary | ICD-10-CM | POA: Diagnosis present

## 2020-10-11 DIAGNOSIS — I5041 Acute combined systolic (congestive) and diastolic (congestive) heart failure: Secondary | ICD-10-CM | POA: Diagnosis present

## 2020-10-11 DIAGNOSIS — E871 Hypo-osmolality and hyponatremia: Secondary | ICD-10-CM | POA: Diagnosis present

## 2020-10-11 DIAGNOSIS — R61 Generalized hyperhidrosis: Secondary | ICD-10-CM | POA: Diagnosis present

## 2020-10-11 DIAGNOSIS — I110 Hypertensive heart disease with heart failure: Secondary | ICD-10-CM | POA: Diagnosis present

## 2020-10-11 DIAGNOSIS — I428 Other cardiomyopathies: Secondary | ICD-10-CM | POA: Diagnosis present

## 2020-10-11 DIAGNOSIS — R0789 Other chest pain: Secondary | ICD-10-CM | POA: Diagnosis not present

## 2020-10-11 DIAGNOSIS — F200 Paranoid schizophrenia: Secondary | ICD-10-CM | POA: Diagnosis present

## 2020-10-11 DIAGNOSIS — I5021 Acute systolic (congestive) heart failure: Secondary | ICD-10-CM | POA: Diagnosis not present

## 2020-10-11 DIAGNOSIS — C8190 Hodgkin lymphoma, unspecified, unspecified site: Secondary | ICD-10-CM | POA: Diagnosis present

## 2020-10-11 DIAGNOSIS — I509 Heart failure, unspecified: Secondary | ICD-10-CM

## 2020-10-11 DIAGNOSIS — Z7984 Long term (current) use of oral hypoglycemic drugs: Secondary | ICD-10-CM | POA: Diagnosis not present

## 2020-10-11 DIAGNOSIS — E785 Hyperlipidemia, unspecified: Secondary | ICD-10-CM | POA: Diagnosis present

## 2020-10-11 DIAGNOSIS — F319 Bipolar disorder, unspecified: Secondary | ICD-10-CM | POA: Diagnosis present

## 2020-10-11 DIAGNOSIS — N400 Enlarged prostate without lower urinary tract symptoms: Secondary | ICD-10-CM | POA: Diagnosis present

## 2020-10-11 DIAGNOSIS — Z6841 Body Mass Index (BMI) 40.0 and over, adult: Secondary | ICD-10-CM | POA: Diagnosis not present

## 2020-10-11 LAB — BASIC METABOLIC PANEL
Anion gap: 11 (ref 5–15)
BUN: 17 mg/dL (ref 6–20)
CO2: 27 mmol/L (ref 22–32)
Calcium: 9.5 mg/dL (ref 8.9–10.3)
Chloride: 97 mmol/L — ABNORMAL LOW (ref 98–111)
Creatinine, Ser: 1.16 mg/dL (ref 0.61–1.24)
GFR, Estimated: >60 mL/min (ref >60)
Glucose, Bld: 154 mg/dL — ABNORMAL HIGH (ref 70–99)
Potassium: 3.9 mmol/L (ref 3.5–5.1)
Sodium: 135 mmol/L (ref 135–145)

## 2020-10-11 LAB — ECHOCARDIOGRAM COMPLETE
Area-P 1/2: 6.54 cm2
Height: 69 in
S' Lateral: 6.05 cm
Weight: 4500.81 oz

## 2020-10-11 LAB — CBC WITH DIFFERENTIAL/PLATELET
Abs Immature Granulocytes: 0.04 10*3/uL (ref 0.00–0.07)
Basophils Absolute: 0.1 10*3/uL (ref 0.0–0.1)
Basophils Relative: 1 %
Eosinophils Absolute: 0.2 10*3/uL (ref 0.0–0.5)
Eosinophils Relative: 2 %
HCT: 49.1 % (ref 39.0–52.0)
Hemoglobin: 16.2 g/dL (ref 13.0–17.0)
Immature Granulocytes: 0 %
Lymphocytes Relative: 28 %
Lymphs Abs: 2.9 10*3/uL (ref 0.7–4.0)
MCH: 25.6 pg — ABNORMAL LOW (ref 26.0–34.0)
MCHC: 33 g/dL (ref 30.0–36.0)
MCV: 77.7 fL — ABNORMAL LOW (ref 80.0–100.0)
Monocytes Absolute: 0.8 10*3/uL (ref 0.1–1.0)
Monocytes Relative: 7 %
Neutro Abs: 6.5 10*3/uL (ref 1.7–7.7)
Neutrophils Relative %: 62 %
Platelets: 213 10*3/uL (ref 150–400)
RBC: 6.32 MIL/uL — ABNORMAL HIGH (ref 4.22–5.81)
RDW: 19.5 % — ABNORMAL HIGH (ref 11.5–15.5)
WBC: 10.4 10*3/uL (ref 4.0–10.5)
nRBC: 0 % (ref 0.0–0.2)

## 2020-10-11 LAB — GLUCOSE, CAPILLARY
Glucose-Capillary: 157 mg/dL — ABNORMAL HIGH (ref 70–99)
Glucose-Capillary: 151 mg/dL — ABNORMAL HIGH (ref 70–99)

## 2020-10-11 LAB — TROPONIN I (HIGH SENSITIVITY)
Troponin I (High Sensitivity): 28 ng/L — ABNORMAL HIGH (ref <18)
Troponin I (High Sensitivity): 25 ng/L — ABNORMAL HIGH (ref <18)

## 2020-10-11 LAB — TSH: TSH: 5.182 u[IU]/mL — ABNORMAL HIGH (ref 0.350–4.500)

## 2020-10-11 LAB — CBG MONITORING, ED
Glucose-Capillary: 166 mg/dL — ABNORMAL HIGH (ref 70–99)
Glucose-Capillary: 158 mg/dL — ABNORMAL HIGH (ref 70–99)

## 2020-10-11 LAB — HIV ANTIBODY (ROUTINE TESTING W REFLEX): HIV Screen 4th Generation wRfx: NONREACTIVE

## 2020-10-11 MED ORDER — RISPERIDONE 0.5 MG PO TABS
0.5000 mg | ORAL_TABLET | Freq: Every day | ORAL | Status: DC
  Administered 2020-10-11 – 2020-10-22 (×12): 0.5 mg via ORAL
  Filled 2020-10-11 (×13): qty 1

## 2020-10-11 MED ORDER — FUROSEMIDE 10 MG/ML IJ SOLN
40.0000 mg | Freq: Two times a day (BID) | INTRAMUSCULAR | Status: DC
  Administered 2020-10-11: 40 mg via INTRAVENOUS
  Filled 2020-10-11: qty 4

## 2020-10-11 MED ORDER — ONDANSETRON HCL 4 MG PO TABS: 4.0000 mg | ORAL_TABLET | Freq: Four times a day (QID) | ORAL | Status: DC | PRN

## 2020-10-11 MED ORDER — LISINOPRIL 5 MG PO TABS
2.5000 mg | ORAL_TABLET | Freq: Every day | ORAL | Status: DC
  Administered 2020-10-11: 2.5 mg via ORAL
  Filled 2020-10-11: qty 1

## 2020-10-11 MED ORDER — ENOXAPARIN SODIUM 60 MG/0.6ML ~~LOC~~ SOLN
60.0000 mg | SUBCUTANEOUS | Status: DC
  Administered 2020-10-11 – 2020-10-22 (×11): 60 mg via SUBCUTANEOUS
  Filled 2020-10-11 (×11): qty 0.6

## 2020-10-11 MED ORDER — FUROSEMIDE 10 MG/ML IJ SOLN
40.0000 mg | Freq: Once | INTRAMUSCULAR | Status: AC
  Administered 2020-10-11: 40 mg via INTRAVENOUS
  Filled 2020-10-11: qty 4

## 2020-10-11 MED ORDER — ACETAMINOPHEN 650 MG RE SUPP: 650.0000 mg | Freq: Four times a day (QID) | RECTAL | Status: DC | PRN

## 2020-10-11 MED ORDER — ONDANSETRON HCL 4 MG/2ML IJ SOLN: 4.0000 mg | Freq: Four times a day (QID) | INTRAMUSCULAR | Status: DC | PRN

## 2020-10-11 MED ORDER — METOPROLOL TARTRATE 25 MG PO TABS
25.0000 mg | ORAL_TABLET | Freq: Two times a day (BID) | ORAL | Status: DC
  Administered 2020-10-11: 25 mg via ORAL
  Filled 2020-10-11: qty 1

## 2020-10-11 MED ORDER — PERFLUTREN LIPID MICROSPHERE
1.0000 mL | INTRAVENOUS | Status: AC | PRN
  Administered 2020-10-11: 2 mL via INTRAVENOUS
  Filled 2020-10-11: qty 10

## 2020-10-11 MED ORDER — LISINOPRIL 5 MG PO TABS
5.0000 mg | ORAL_TABLET | Freq: Every day | ORAL | Status: DC
  Administered 2020-10-12: 5 mg via ORAL
  Filled 2020-10-11: qty 1

## 2020-10-11 MED ORDER — ATORVASTATIN CALCIUM 10 MG PO TABS
10.0000 mg | ORAL_TABLET | Freq: Every day | ORAL | Status: DC
  Administered 2020-10-11 – 2020-10-23 (×13): 10 mg via ORAL
  Filled 2020-10-11 (×13): qty 1

## 2020-10-11 MED ORDER — CYCLOBENZAPRINE HCL 10 MG PO TABS
10.0000 mg | ORAL_TABLET | Freq: Three times a day (TID) | ORAL | Status: DC
  Administered 2020-10-11 – 2020-10-23 (×36): 10 mg via ORAL
  Filled 2020-10-11 (×36): qty 1

## 2020-10-11 MED ORDER — MELATONIN 3 MG PO TABS
3.0000 mg | ORAL_TABLET | Freq: Every day | ORAL | Status: DC
  Administered 2020-10-11 – 2020-10-22 (×12): 3 mg via ORAL
  Filled 2020-10-11 (×12): qty 1

## 2020-10-11 MED ORDER — HYDROCHLOROTHIAZIDE 25 MG PO TABS
25.0000 mg | ORAL_TABLET | Freq: Every day | ORAL | Status: DC
  Filled 2020-10-11: qty 1

## 2020-10-11 MED ORDER — NITROGLYCERIN 2 % TD OINT
1.0000 [in_us] | TOPICAL_OINTMENT | Freq: Once | TRANSDERMAL | Status: AC
  Administered 2020-10-11: 1 [in_us] via TOPICAL
  Filled 2020-10-11: qty 30

## 2020-10-11 MED ORDER — BRIMONIDINE TARTRATE 0.2 % OP SOLN
1.0000 [drp] | Freq: Two times a day (BID) | OPHTHALMIC | Status: DC
  Administered 2020-10-11 – 2020-10-23 (×22): 1 [drp] via OPHTHALMIC
  Filled 2020-10-11 (×2): qty 5

## 2020-10-11 MED ORDER — ACETAMINOPHEN 325 MG PO TABS
650.0000 mg | ORAL_TABLET | Freq: Four times a day (QID) | ORAL | Status: DC | PRN
  Administered 2020-10-11 – 2020-10-22 (×4): 650 mg via ORAL
  Filled 2020-10-11 (×4): qty 2

## 2020-10-11 MED ORDER — POLYETHYLENE GLYCOL 3350 17 G PO PACK
17.0000 g | PACK | Freq: Every day | ORAL | Status: DC
  Administered 2020-10-11 – 2020-10-15 (×4): 17 g via ORAL
  Filled 2020-10-11 (×4): qty 1

## 2020-10-11 MED ORDER — LEVOTHYROXINE SODIUM 100 MCG PO TABS
100.0000 ug | ORAL_TABLET | Freq: Every day | ORAL | Status: DC
  Administered 2020-10-11 – 2020-10-23 (×13): 100 ug via ORAL
  Filled 2020-10-11 (×13): qty 1

## 2020-10-11 MED ORDER — TAMSULOSIN HCL 0.4 MG PO CAPS
0.4000 mg | ORAL_CAPSULE | Freq: Every day | ORAL | Status: DC
  Administered 2020-10-11 – 2020-10-23 (×13): 0.4 mg via ORAL
  Filled 2020-10-11 (×13): qty 1

## 2020-10-11 MED ORDER — LATANOPROST 0.005 % OP SOLN
1.0000 [drp] | Freq: Every day | OPHTHALMIC | Status: DC
  Administered 2020-10-11 – 2020-10-22 (×9): 1 [drp] via OPHTHALMIC
  Filled 2020-10-11 (×2): qty 2.5

## 2020-10-11 MED ORDER — CARVEDILOL 6.25 MG PO TABS
6.2500 mg | ORAL_TABLET | Freq: Two times a day (BID) | ORAL | Status: DC
  Administered 2020-10-11 – 2020-10-13 (×4): 6.25 mg via ORAL
  Filled 2020-10-11 (×4): qty 1

## 2020-10-11 MED ORDER — FUROSEMIDE 10 MG/ML IJ SOLN
60.0000 mg | Freq: Two times a day (BID) | INTRAMUSCULAR | Status: DC
  Administered 2020-10-11 – 2020-10-14 (×7): 60 mg via INTRAVENOUS
  Filled 2020-10-11 (×6): qty 6

## 2020-10-11 MED ORDER — GUAIFENESIN-CODEINE 100-10 MG/5ML PO SOLN
10.0000 mL | Freq: Four times a day (QID) | ORAL | Status: DC | PRN
  Administered 2020-10-12 – 2020-10-22 (×10): 10 mL via ORAL
  Filled 2020-10-11 (×11): qty 10

## 2020-10-11 MED ORDER — INSULIN ASPART 100 UNIT/ML ~~LOC~~ SOLN
0.0000 [IU] | Freq: Three times a day (TID) | SUBCUTANEOUS | Status: DC
  Administered 2020-10-11 – 2020-10-13 (×8): 3 [IU] via SUBCUTANEOUS
  Administered 2020-10-14: 8 [IU] via SUBCUTANEOUS
  Administered 2020-10-15 – 2020-10-16 (×5): 5 [IU] via SUBCUTANEOUS
  Administered 2020-10-16 – 2020-10-17 (×2): 8 [IU] via SUBCUTANEOUS
  Administered 2020-10-17 (×2): 11 [IU] via SUBCUTANEOUS
  Administered 2020-10-18: 18:00:00 5 [IU] via SUBCUTANEOUS
  Administered 2020-10-18 (×2): 8 [IU] via SUBCUTANEOUS
  Administered 2020-10-19: 12:00:00 5 [IU] via SUBCUTANEOUS
  Administered 2020-10-19: 09:00:00 8 [IU] via SUBCUTANEOUS
  Administered 2020-10-19: 18:00:00 5 [IU] via SUBCUTANEOUS
  Administered 2020-10-20: 12:00:00 11 [IU] via SUBCUTANEOUS
  Administered 2020-10-20: 08:00:00 8 [IU] via SUBCUTANEOUS
  Administered 2020-10-20: 17:00:00 3 [IU] via SUBCUTANEOUS
  Administered 2020-10-21: 12:00:00 8 [IU] via SUBCUTANEOUS
  Administered 2020-10-21: 08:00:00 5 [IU] via SUBCUTANEOUS
  Administered 2020-10-21: 17:00:00 3 [IU] via SUBCUTANEOUS
  Administered 2020-10-22: 09:00:00 5 [IU] via SUBCUTANEOUS
  Administered 2020-10-22: 18:00:00 3 [IU] via SUBCUTANEOUS
  Administered 2020-10-22 – 2020-10-23 (×2): 5 [IU] via SUBCUTANEOUS
  Administered 2020-10-23: 13:00:00 8 [IU] via SUBCUTANEOUS
  Filled 2020-10-11: qty 0.15

## 2020-10-11 MED ORDER — ENSURE ENLIVE PO LIQD
237.0000 mL | Freq: Two times a day (BID) | ORAL | Status: DC
  Administered 2020-10-11 – 2020-10-17 (×13): 237 mL via ORAL

## 2020-10-11 NOTE — Progress Notes (Signed)
Received report from [place], [place] in the ED at 1300 and pt arrived to unit at 1318, noted.

## 2020-10-11 NOTE — Progress Notes (Signed)
  Echocardiogram 2D Echocardiogram has been performed.  Charles [name] 10/11/2020, 3:58 PM

## 2020-10-11 NOTE — ED Notes (Signed)
[place] at bedside, will collect pending labs once [place] complete.

## 2020-10-11 NOTE — Plan of Care (Signed)
  Problem: Education: Goal: Ability to describe self-care measures that may prevent or decrease complications (Diabetes Survival Skills Education) will improve Outcome: Progressing Goal: Individualized Educational Video(s) Outcome: Progressing   Problem: Coping: Goal: Ability to adjust to condition or change in health will improve Outcome: Progressing   Problem: Fluid Volume: Goal: Ability to maintain a balanced intake and output will improve Outcome: Progressing   Problem: Health Behavior/Discharge Planning: Goal: Ability to identify and utilize available resources and services will improve Outcome: Progressing Goal: Ability to manage health-related needs will improve Outcome: Progressing   Problem: Metabolic: Goal: Ability to maintain appropriate glucose levels will improve Outcome: Progressing   Problem: Nutritional: Goal: Maintenance of adequate nutrition will improve Outcome: Progressing Goal: Progress toward achieving an optimal weight will improve Outcome: Progressing   Problem: Skin Integrity: Goal: Risk for impaired skin integrity will decrease Outcome: Progressing   Problem: Tissue Perfusion: Goal: Adequacy of tissue perfusion will improve Outcome: Progressing   Problem: Education: Goal: Knowledge of General Education information will improve Description: Including pain rating scale, medication(s)/side effects and non-pharmacologic comfort measures Outcome: Progressing   Problem: Health Behavior/Discharge Planning: Goal: Ability to manage health-related needs will improve Outcome: Progressing   Problem: Clinical Measurements: Goal: Ability to maintain clinical measurements within normal limits will improve Outcome: Progressing Goal: Will remain free from infection Outcome: Progressing Goal: Diagnostic test results will improve Outcome: Progressing Goal: Respiratory complications will improve Outcome: Progressing Goal: Cardiovascular complication will  be avoided Outcome: Progressing   Problem: Coping: Goal: Level of anxiety will decrease Outcome: Progressing   Problem: Skin Integrity: Goal: Risk for impaired skin integrity will decrease Outcome: Progressing   Problem: Safety: Goal: Ability to remain free from injury will improve Outcome: Progressing   Problem: Pain Managment: Goal: General experience of comfort will improve Outcome: Progressing   Problem: Elimination: Goal: Will not experience complications related to bowel motility Outcome: Progressing Goal: Will not experience complications related to urinary retention Outcome: Progressing

## 2020-10-11 NOTE — Plan of Care (Signed)
  Problem: Education: Goal: Ability to describe self-care measures that may prevent or decrease complications (Diabetes Survival Skills Education) will improve 10/11/2020 1818 by Gena Fray, RN Outcome: Progressing 10/11/2020 1350 by Gena Fray, RN Outcome: Progressing Goal: Individualized Educational Video(s) 10/11/2020 1818 by Gena Fray, RN Outcome: Progressing 10/11/2020 1350 by Gena Fray, RN Outcome: Progressing   Problem: Coping: Goal: Ability to adjust to condition or change in health will improve 10/11/2020 1818 by Gena Fray, RN Outcome: Progressing 10/11/2020 1350 by Gena Fray, RN Outcome: Progressing   Problem: Fluid Volume: Goal: Ability to maintain a balanced intake and output will improve 10/11/2020 1818 by Gena Fray, RN Outcome: Progressing 10/11/2020 1350 by Gena Fray, RN Outcome: Progressing   Problem: Health Behavior/Discharge Planning: Goal: Ability to identify and utilize available resources and services will improve 10/11/2020 1818 by Gena Fray, RN Outcome: Progressing 10/11/2020 1350 by Gena Fray, RN Outcome: Progressing Goal: Ability to manage health-related needs will improve 10/11/2020 1818 by Gena Fray, RN Outcome: Progressing 10/11/2020 1350 by Gena Fray, RN Outcome: Progressing   Problem: Metabolic: Goal: Ability to maintain appropriate glucose levels will improve 10/11/2020 1818 by Gena Fray, RN Outcome: Progressing 10/11/2020 1350 by Gena Fray, RN Outcome: Progressing   Problem: Nutritional: Goal: Maintenance of adequate nutrition will improve 10/11/2020 1818 by Gena Fray, RN Outcome: Progressing 10/11/2020 1350 by Gena Fray, RN Outcome: Progressing Goal: Progress toward achieving an optimal weight will improve 10/11/2020 1818 by Gena Fray, RN Outcome: Progressing 10/11/2020 1350 by Gena Fray, RN Outcome:  Progressing   Problem: Tissue Perfusion: Goal: Adequacy of tissue perfusion will improve 10/11/2020 1818 by Gena Fray, RN Outcome: Progressing 10/11/2020 1350 by Gena Fray, RN Outcome: Progressing   Problem: Health Behavior/Discharge Planning: Goal: Ability to manage health-related needs will improve 10/11/2020 1818 by Gena Fray, RN Outcome: Progressing 10/11/2020 1350 by Gena Fray, RN Outcome: Progressing

## 2020-10-11 NOTE — H&P (Signed)
History and Physical    NOBUO NUNZIATA RCV:[id number] DOB: [birth year] DOA: 10/10/2020  PCP: Patient, No Pcp Per  Patient coming from: [facility]  I have personally briefly reviewed patient's old medical records in [place]  Chief Complaint: SOB  HPI: Charles [name] is a 54 y.o. male with medical history significant of HTN, DM2, obesity.  Apparently pt had dyspnea and BLE edema back in Sept.  Work up at ED at [facility] was significant for mediastinal lymphadenopathy.  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  He has appointment Dec 28th with CVTS to discuss biopsy.  3-4 weeks ago pt developed night sweats and subjective fevers which have been persistent.  No weight loss though.  Peripheral edema has resolved but SOB has progressively worsened.  Has productive cough.  He presents to the ED with c/o progressively worsening SOB.  COVID vaccinated.  No CP, no abd pain, peripheral edema has improved since Sept.   ED Course: HR 110-130 S.Tach.  BP 141/86.  BNP nl, trop nl.  CT scan of chest: 1) No PE 2) moderate cardiomegally 3) worsening mediastinal adenopathy as previously evaluated on pet-ct  Pt given Lasix, nitro paste in ED.  1L UOP thus far.   Review of Systems: As per HPI, otherwise all review of systems negative.  Past Medical History:  Diagnosis Date  . Bipolar 1 disorder ([place])   . DM2 (diabetes mellitus, type 2) ([place])   . HTN (hypertension)     Past Surgical History:  Procedure Laterality Date  . SPINAL FIXATION SURGERY  02/26/2020     reports that he has been smoking cigarettes. He has been smoking about 0.25 packs per day. He has never used smokeless tobacco. He reports current drug use. Drug: Cocaine. No history on file for alcohol use.  Allergies  Allergen Reactions  . Ibuprofen   . Penicillins Rash    Family History  Problem Relation Age of  Onset  . Hypertension Mother   . Hyperlipidemia Mother   . Stroke Mother   . Cancer Mother   . Hypertension Father   . Hyperlipidemia Father   . Healthy Sister      Prior to Admission medications   Medication Sig Start Date End Date Taking? Authorizing Provider  acetaminophen (TYLENOL) 325 MG tablet Take 650 mg by mouth every 6 (six) hours as needed.   Yes [provider]  brimonidine (ALPHAGAN) 0.2 % ophthalmic solution Place 1 drop into both eyes 2 (two) times daily.    Yes [provider]  cyclobenzaprine (FLEXERIL) 10 MG tablet Take 10 mg by mouth 3 (three) times daily.   Yes [provider]  hydrochlorothiazide (HYDRODIURIL) 25 MG tablet Take 25 mg by mouth daily.   Yes [provider]  levothyroxine (SYNTHROID) 100 MCG tablet Take 100 mcg by mouth daily before breakfast.   Yes [provider]  lisinopril (ZESTRIL) 2.5 MG tablet Take 2.5 mg by mouth daily.   Yes [provider]  metFORMIN (GLUCOPHAGE) 500 MG tablet Take 500 mg by mouth 2 (two) times daily with a meal.   Yes [provider]  metoprolol tartrate (LOPRESSOR) 25 MG tablet Take 25 mg by mouth 2 (two) times daily. 09/08/20  Yes [provider]  Paliperidone Palmitate (INVEGA SUSTENNA IM) Inject 1 pen into the muscle every 30 (thirty) days.     Yes [provider]  tamsulosin (FLOMAX) 0.4 MG CAPS capsule Take 0.4 mg by mouth daily. 09/08/20  Yes [provider]  atorvastatin (LIPITOR) 10 MG tablet Take 10 mg by mouth daily. 09/30/20   [provider]  latanoprost (XALATAN) 0.005 % ophthalmic solution Place 1 drop into both eyes at bedtime.     [provider]  melatonin 3 MG TABS tablet Take 1 tablet by mouth at bedtime. 09/08/20   [provider]  risperiDONE (RISPERDAL) 0.5 MG tablet Take 0.5 mg by mouth at bedtime.    [provider]    Physical Exam: Vitals:   10/11/20 0230 10/11/20 0300 10/11/20  0330 10/11/20 0400  BP: (!) 141/100 (!) 140/104 (!) 144/97 (!) 141/86  Pulse: (!) 116 (!) 111 (!) 118 (!) 118  Resp: 20 (!) 24 (!) 24 (!) 24  Temp:      TempSrc:      SpO2: 99% 97% 95% 96%  Weight:      Height:        Constitutional: NAD, calm, comfortable Eyes: PERRL, lids and conjunctivae normal ENMT: Mucous membranes are moist. Posterior pharynx clear of any exudate or lesions.Normal dentition.  Neck: normal, supple, no masses, no thyromegaly Respiratory: clear to auscultation bilaterally, no wheezing, no crackles. Normal respiratory effort. No accessory muscle use.  Cardiovascular: Tachycardic, No extremity edema Abdomen: no tenderness, no masses palpated. No hepatosplenomegaly. Bowel sounds positive.  Musculoskeletal: no clubbing / cyanosis. No joint deformity upper and lower extremities. Good ROM, no contractures. Normal muscle tone.  Skin: no rashes, lesions, ulcers. No induration Neurologic: CN 2-12 grossly intact. Sensation intact, DTR normal. Strength 5/5 in all 4.  Psychiatric: Normal judgment and insight. Alert and oriented x 3. Normal mood.    Labs on Admission: I have personally reviewed following labs and imaging studies  CBC: Recent Labs  Lab 10/10/20 2102  WBC 11.6*  NEUTROABS 6.6  HGB 15.1  HCT 46.7  MCV 77.4*  PLT 427   Basic Metabolic Panel: Recent Labs  Lab 10/10/20 2102 10/10/20 2211  NA 133* 135  K 6.3* 4.1  CL 99 99  CO2 21* 24  GLUCOSE 124* 131*  BUN 16 16  CREATININE 1.31* 1.08  CALCIUM 9.1 9.0   GFR: Estimated Creatinine Clearance: 101 mL/min (by C-G formula based on SCr of 1.08 mg/dL). Liver Function Tests: No results for input(s): AST, ALT, ALKPHOS, BILITOT, PROT, ALBUMIN in the last 168 hours. No results for input(s): LIPASE, AMYLASE in the last 168 hours. No results for input(s): AMMONIA in the last 168 hours. Coagulation Profile: Recent Labs  Lab 10/10/20 2138  INR 1.0   Cardiac Enzymes: No results for input(s): CKTOTAL,  CKMB, CKMBINDEX, TROPONINI in the last 168 hours. BNP (last 3 results) No results for input(s): PROBNP in the last 8760 hours. HbA1C: No results for input(s): HGBA1C in the last 72 hours. CBG: No results for input(s): GLUCAP in the last 168 hours. Lipid Profile: No results for input(s): CHOL, HDL, LDLCALC, TRIG, CHOLHDL, LDLDIRECT in the last 72 hours. Thyroid Function Tests: Recent Labs    10/10/20 2309  TSH 5.182*   Anemia Panel: No results for input(s): VITAMINB12, FOLATE, FERRITIN, TIBC, IRON, RETICCTPCT in the last 72 hours. Urine analysis:    Component Value Date/Time   COLORURINE YELLOW 11/09/2011 1522   APPEARANCEUR CLEAR 11/09/2011 1522   LABSPEC 1.036 (H) 11/09/2011 1522   PHURINE 5.5 11/09/2011 1522   GLUCOSEU NEGATIVE 11/09/2011 1522   HGBUR NEGATIVE 11/09/2011 1522  BILIRUBINUR SMALL (A) 11/09/2011 1522   KETONESUR 15 (A) 11/09/2011 1522   PROTEINUR NEGATIVE 11/09/2011 1522   UROBILINOGEN 1.0 11/09/2011 1522   NITRITE NEGATIVE 11/09/2011 1522   LEUKOCYTESUR NEGATIVE 11/09/2011 1522    Radiological Exams on Admission: CT Angio Chest PE W and/or Wo Contrast  Result Date: 10/10/2020 CLINICAL DATA:  PE suspected.  Increasing shortness of breath. EXAM: CT ANGIOGRAPHY CHEST WITH CONTRAST TECHNIQUE: Multidetector CT imaging of the chest was performed using the standard protocol during bolus administration of intravenous contrast. Multiplanar CT image reconstructions and MIPs were obtained to evaluate the vascular anatomy. CONTRAST:  190mL OMNIPAQUE IOHEXOL 350 MG/ML SOLN COMPARISON:  07/18/2020 FINDINGS: Cardiovascular: Contrast injection is sufficient to demonstrate satisfactory opacification of the pulmonary arteries to the segmental level. There is no pulmonary embolus or evidence of right heart strain. The size of the main pulmonary artery is normal. Moderate cardiomegaly. The course and caliber of the aorta are normal. There is no atherosclerotic calcification.  Opacification decreased due to pulmonary arterial phase contrast bolus timing. Mediastinum/Nodes: --again noted is mediastinal adenopathy which has increased in size from prior study. For example the dominant lymph node now measures 2.5 cm (previously measuring approximately 2.1 cm. -- No hilar lymphadenopathy. -- No axillary lymphadenopathy. -- No supraclavicular lymphadenopathy. -- Normal thyroid gland where visualized. -  Unremarkable esophagus. Lungs/Pleura: Airways are patent. No pleural effusion, lobar consolidation, pneumothorax or pulmonary infarction. Upper Abdomen: Contrast bolus timing is not optimized for evaluation of the abdominal organs. The visualized portions of the organs of the upper abdomen are normal. Musculoskeletal: No chest wall abnormality. No bony spinal canal stenosis. Stable postsurgical changes are noted of the midthoracic spine. Review of the MIP images confirms the above findings. IMPRESSION: 1. No evidence of acute pulmonary embolus. 2. Moderate cardiomegaly. 3. Worsening mediastinal adenopathy as previously evaluated on the patient's PET-CT. Electronically Signed   By: Constance Holster M.D.   On: 10/10/2020 23:01   DG Chest Port 1 View  Result Date: 10/10/2020 CLINICAL DATA:  54 year old male with shortness of breath. EXAM: PORTABLE CHEST 1 VIEW COMPARISON:  Chest radiograph dated 01/06/2007. FINDINGS: Cardiomegaly with vascular congestion. No focal consolidation, pleural effusion or pneumothorax. No acute osseous pathology. IMPRESSION: Cardiomegaly with vascular congestion. No focal consolidation. Electronically Signed   By: Anner Crete M.D.   On: 10/10/2020 21:20    EKG: Independently reviewed.  Assessment/Plan Principal Problem:   Shortness of breath Active Problems:   Hypertension   Mediastinal lymphadenopathy   Sinus tachycardia   Night sweats   DM2 (diabetes mellitus, type 2) ([place])    1. Shortness of breath - 1. Lymphoma of some sort seems likely to  be playing a roll, with or without CHF as well 2. Got lasix in ED, defer to day team if the want more 3. NTG paste in ED 4. Checking 2d echo 5. May want to discuss with cardiology depending on 2d echo results 6. Tele monitor 2. HTN - 1. Cont home BP meds 2. Consider increasing metoprolol with the tachycardia 3. Mediastinal lymphadenopathy + "B symptoms" - 1. Suspect a lymphoma of some sort here 2. Current plan is f/u with CVTS, but looks like he doesn't have an appointment until Dec 28th. 3. Might want to see if we can get the biopsy accelerated 4. DM2 - 1. Hold metformin 2. Mod scale SSI AC  DVT prophylaxis: Lovenox Code Status: Full Family Communication: No family in room Disposition Plan: SNF after admit Consults called: None Admission status:  Place in obs    Parrish Daddario, [place] Hospitalists  How to contact the [facility] Attending or Consulting provider [place] or covering provider during after hours [place], for this patient?  1. Check the care team in [facility] and look for a) attending/consulting [facility] provider listed and b) the [facility] team listed 2. Log into [email or web address]  Amion Physician Scheduling and messaging for groups and whole hospitals  On call and physician scheduling software for group practices, residents, hospitalists and other medical providers for call, clinic, rotation and shift schedules. OnCall [employer] is a hospital-wide system for scheduling doctors and paging doctors on call. EasyPlot is for scientific plotting and data analysis.  [email or web address]  and use [place]'s universal password to access. If you do not have the password, please contact the hospital operator.  3. Locate the [facility] provider you are looking for under [facility] and page to a number that you can be directly reached. 4. If you still have difficulty reaching the provider, please page the [facility] (Director on Call) for the Hospitalists listed on amion for assistance.  10/11/2020, 4:46 AM

## 2020-10-11 NOTE — Progress Notes (Signed)
   10/11/20 1530  Vitals  Temp 98.4 F (36.9 C)  Temp Source Oral  BP 102/82  MAP (mmHg) 88  BP Location Right Arm  BP Method Automatic  Patient Position (if appropriate) Lying  Pulse Rate (!) 115  Resp (!) 21  Level of Consciousness  Level of Consciousness Alert  MEWS COLOR  MEWS Score Color Yellow  Oxygen Therapy  SpO2 96 %  MEWS Score  MEWS Temp 0  MEWS Systolic 0  MEWS Pulse 2  MEWS RR 1  MEWS LOC 0  MEWS Score 3  Yellow mews vital signs

## 2020-10-11 NOTE — ED Provider Notes (Signed)
I assumed care in signout to follow-up on imaging.  CT scans were delayed, but no signs of acute PE.  On reassessment, patient was tachycardic with ambulation.  When I reexamined patient any movement in the bed triggered tachycardia to 130s.  EKG revealed sinus tachycardia.  He has coarse breath sounds bilaterally.  Due to persistent tachycardia and tachypnea, will admit to the hospital. We will also add on troponin prior to admission. Clinically patient appears to have evidence of CHF with elevated blood pressure, will give nitroglycerin and Lasix    EKG Interpretation  Date/Time:  Saturday October 11 2020 02:56:22 EST Ventricular Rate:  114 PR Interval:    QRS Duration: 106 QT Interval:  367 QTC Calculation: 506 R Axis:   74 Text Interpretation: Sinus tachycardia Prolonged QT interval No significant change since last tracing Confirmed by Ripley Fraise [phone]) on 10/11/2020 3:03:08 AM         Ripley Fraise, MD 10/11/20 [phone]

## 2020-10-11 NOTE — ED Notes (Signed)
Echo tech at bedside and states that he will return after lunch hrs to evaluate pt HR. Echo not done at this time.

## 2020-10-11 NOTE — Progress Notes (Signed)
   10/11/20 1331  Vitals  Temp 98.4 F (36.9 C)  Temp Source Oral  BP (!) 146/92  MAP (mmHg) 105  BP Location Right Arm  BP Method Automatic  Patient Position (if appropriate) Lying  Pulse Rate (!) 120  Resp (!) 23  Level of Consciousness  Level of Consciousness Alert  MEWS COLOR  MEWS Score Color Yellow  Oxygen Therapy  SpO2 96 %  MEWS Score  MEWS Temp 0  MEWS Systolic 0  MEWS Pulse 2  MEWS RR 1  MEWS LOC 0  MEWS Score 3  Yellow [place] activated

## 2020-10-11 NOTE — ED Provider Notes (Signed)
D/w Dr. Alcario Drought for admission    Ripley Fraise, MD 10/11/20 [phone]

## 2020-10-11 NOTE — ED Notes (Signed)
Ambulated patient. Heart rate went up to 130s. Oxygen saturation remained above 97% on 2L [place]. Patient said he felt "just a little tired."

## 2020-10-11 NOTE — ED Notes (Addendum)
Patient urinated all over the floor in his room. He said "he couldn't hold it any longer." [place], NT provided a urinal before he peed on the floor.

## 2020-10-11 NOTE — Progress Notes (Signed)
[name] a 54 year old male with history of hypertension, diabetes type 2, obesity who presented to the emergency department with complaints of shortness of breath.  He was recently suspected to have lymphoma due to finding of metastatic lymphadenopathy and was following with oncology and has been planned for biopsy by CT surgery. He follows with Dr. Lorenso Courier On presentation he was tachycardic, hypoxic needing 2 L of oxygen per minute.  Chest x-ray showed possible pulmonary edema.  Also was endorsing shortness of breath. CT angiogram of the chest did not show any PE but showed worsening mediastinal lymphadenopathy. Echocardiogram has been ordered.  He has been started on IV Lasix for suspected volume overload.  BNP is normal.  Ultrasound of the abdomen did not show any ascites.   Patient seen and examined at the bedside this morning the emergency  department.  He was in sinus tachycardia to my evaluation, weak but not in respiratory distress.  He did not have significant  peripheral edema but has mild decreased bilateral air entry, has abdomen distention. Patient seen by Dr. Alcario Drought this morning.  I agree with assessment and plan.

## 2020-10-11 NOTE — Progress Notes (Signed)
Echo attempted. HR elevated above 130 bpm.  Will attempt once HR lowers

## 2020-10-12 DIAGNOSIS — R590 Localized enlarged lymph nodes: Secondary | ICD-10-CM

## 2020-10-12 DIAGNOSIS — I429 Cardiomyopathy, unspecified: Secondary | ICD-10-CM

## 2020-10-12 DIAGNOSIS — Z794 Long term (current) use of insulin: Secondary | ICD-10-CM

## 2020-10-12 DIAGNOSIS — R0602 Shortness of breath: Secondary | ICD-10-CM

## 2020-10-12 DIAGNOSIS — I5041 Acute combined systolic (congestive) and diastolic (congestive) heart failure: Secondary | ICD-10-CM

## 2020-10-12 DIAGNOSIS — R000 Tachycardia, unspecified: Secondary | ICD-10-CM | POA: Diagnosis not present

## 2020-10-12 DIAGNOSIS — E119 Type 2 diabetes mellitus without complications: Secondary | ICD-10-CM

## 2020-10-12 LAB — CBC WITH DIFFERENTIAL/PLATELET
Abs Immature Granulocytes: 0.14 10*3/uL — ABNORMAL HIGH (ref 0.00–0.07)
Basophils Absolute: 0.1 10*3/uL (ref 0.0–0.1)
Basophils Relative: 1 %
Eosinophils Absolute: 0.2 10*3/uL (ref 0.0–0.5)
Eosinophils Relative: 2 %
HCT: 50.7 % (ref 39.0–52.0)
Hemoglobin: 16.5 g/dL (ref 13.0–17.0)
Immature Granulocytes: 1 %
Lymphocytes Relative: 31 %
Lymphs Abs: 3.1 10*3/uL (ref 0.7–4.0)
MCH: 24.9 pg — ABNORMAL LOW (ref 26.0–34.0)
MCHC: 32.5 g/dL (ref 30.0–36.0)
MCV: 76.6 fL — ABNORMAL LOW (ref 80.0–100.0)
Monocytes Absolute: 0.8 10*3/uL (ref 0.1–1.0)
Monocytes Relative: 8 %
Neutro Abs: 5.8 10*3/uL (ref 1.7–7.7)
Neutrophils Relative %: 57 %
Platelets: 156 10*3/uL (ref 150–400)
RBC: 6.62 MIL/uL — ABNORMAL HIGH (ref 4.22–5.81)
RDW: 19.7 % — ABNORMAL HIGH (ref 11.5–15.5)
WBC: 10 10*3/uL (ref 4.0–10.5)
nRBC: 0 % (ref 0.0–0.2)

## 2020-10-12 LAB — BASIC METABOLIC PANEL
Anion gap: 11 (ref 5–15)
BUN: 20 mg/dL (ref 6–20)
CO2: 25 mmol/L (ref 22–32)
Calcium: 9.3 mg/dL (ref 8.9–10.3)
Chloride: 98 mmol/L (ref 98–111)
Creatinine, Ser: 1.24 mg/dL (ref 0.61–1.24)
GFR, Estimated: >60 mL/min (ref >60)
Glucose, Bld: 156 mg/dL — ABNORMAL HIGH (ref 70–99)
Potassium: 4 mmol/L (ref 3.5–5.1)
Sodium: 134 mmol/L — ABNORMAL LOW (ref 135–145)

## 2020-10-12 LAB — GLUCOSE, CAPILLARY
Glucose-Capillary: 178 mg/dL — ABNORMAL HIGH (ref 70–99)
Glucose-Capillary: 182 mg/dL — ABNORMAL HIGH (ref 70–99)
Glucose-Capillary: 181 mg/dL — ABNORMAL HIGH (ref 70–99)
Glucose-Capillary: 179 mg/dL — ABNORMAL HIGH (ref 70–99)

## 2020-10-12 LAB — T4, FREE: Free T4: 1.13 ng/dL — ABNORMAL HIGH (ref 0.61–1.12)

## 2020-10-12 LAB — HEMOGLOBIN A1C
Hgb A1c MFr Bld: 8.1 % — ABNORMAL HIGH (ref 4.8–5.6)
Mean Plasma Glucose: 185.77 mg/dL

## 2020-10-12 MED ORDER — LOSARTAN POTASSIUM 25 MG PO TABS
25.0000 mg | ORAL_TABLET | Freq: Every day | ORAL | Status: DC
  Administered 2020-10-13: 25 mg via ORAL
  Filled 2020-10-12: qty 1

## 2020-10-12 NOTE — Plan of Care (Signed)
  Problem: Fluid Volume: Goal: Ability to maintain a balanced intake and output will improve Outcome: Progressing   Problem: Metabolic: Goal: Ability to maintain appropriate glucose levels will improve Outcome: Progressing   Problem: Nutritional: Goal: Maintenance of adequate nutrition will improve Outcome: Progressing   Problem: Skin Integrity: Goal: Risk for impaired skin integrity will decrease Outcome: Progressing   Problem: Education: Goal: Knowledge of General Education information will improve Description: Including pain rating scale, medication(s)/side effects and non-pharmacologic comfort measures Outcome: Progressing   Problem: Clinical Measurements: Goal: Respiratory complications will improve Outcome: Progressing Goal: Cardiovascular complication will be avoided Outcome: Progressing   Problem: Activity: Goal: Risk for activity intolerance will decrease Outcome: Progressing

## 2020-10-12 NOTE — Progress Notes (Signed)
PROGRESS NOTE    Charles [name]  YJE:[id number] DOB: 03-Apr-1966 DOA: 10/10/2020 PCP: Patient, No Pcp Per   Chief Complain: Shortness of breath  Brief Narrative: [name] a 54 year old male with history of hypertension, diabetes type 2, obesity who presented to the emergency department with complaints of shortness of breath.  He was recently suspected to have lymphoma due to finding of metastatic lymphadenopathy and was following with oncology and has been planned for biopsy by CT surgery. He follows with Dr. Lorenso Courier.On presentation he was tachycardic, hypoxic needing 2 L of oxygen per minute.  Chest x-ray showed possible pulmonary edema.  Also was endorsing shortness of breath.CT angiogram of the chest did not show any PE but showed worsening mediastinal lymphadenopathy. Echocardiogram showed severe reduction in ejection fraction of 20 to 25%.  Currently being diuresed with IV Lasix.  Cardiology consulted.  Assessment & Plan:   Principal Problem:   Shortness of breath Active Problems:   Hypertension   Mediastinal lymphadenopathy   Sinus tachycardia   Night sweats   DM2 (diabetes mellitus, type 2) (HCC)   Lymphoma (HCC)   Acute congestive heart failure with reduced ejection fraction: Presented with dyspnea.  Appeared volume overloaded on presentation but BNP was normal.  Echo done here showed ejection fraction of 20 to 25% was a new finding.  Cardiology consulted.  Continue IV Lasix because he still appears well overloaded.  Started on Coreg here, continue lisinopril  Acute hypoxic respiratory failure: Suspected to be from CHF exacerbation.  CT angiogram of the chest did not show any PE.  Currently on 2 L of oxygen per minute.  Mediastinal lymphadenopathy: Follows with oncology.  High suspicion for Hodgkin's lymphoma.  CT imaging showed progressive lymphadenopathy.  [name] following up with CT surgery for the biopsy of the lymph nodes  Hypertension: Currently well stable.  Continue  current medications.  Patient has sinus tachycardia.  Diabetes type 2: On Metformin at home.  Continue sliding scale insulin.  Hemoglobin A1c of 8.1.  Hypothyroidism: Continue Synthyroid.  Mild elevated TSH level.  BPH: Flomax  Hyperlipidemia: Takes Lipitor at home.  Morbid obesity: BMI of 41.5          DVT prophylaxis: Lovenox Code Status: Full Family Communication: None at bedside Status is: Inpatient  Remains inpatient appropriate because:Inpatient level of care appropriate due to severity of illness   Dispo: The patient is from: Home( ALF)              Anticipated d/c is to: Home              Anticipated d/c date is: 3 days              Patient currently is not medically stable to d/c.     Consultants: Cardiology  Procedures: None  Antimicrobials:  Anti-infectives (From admission, onward)   None      Subjective: Patient seen and examined at the bedside this morning.  Hemodynamically stable, still on some sinus tachycardia.  Still complains of dyspnea but currently stable on 2 to 3 L of oxygen per minute.  Still appears volume overloaded.  Objective: Vitals:   10/11/20 1750 10/11/20 2027 10/12/20 0126 10/12/20 0430  BP: 119/72 129/81 115/72 (!) 126/94  Pulse: (!) 120 (!) 119 (!) 110 (!) 110  Resp: 16 20 20    Temp: 97.7 F (36.5 C) 97.8 F (36.6 C) 98.2 F (36.8 C) 98.3 F (36.8 C)  TempSrc: Oral Oral Oral Oral  SpO2: 94%  97% 96% 99%  Weight:      Height:        Intake/Output Summary (Last 24 hours) at 10/12/2020 1125 Last data filed at 10/12/2020 1022 Gross per 24 hour  Intake --  Output 1600 ml  Net -1600 ml   Filed Weights   10/10/20 2105  Weight: 127.6 kg    Examination:  General exam: Morbidly obese HEENT:PERRL,Oral mucosa moist, Ear/Nose normal on gross exam Respiratory system: Bilateral diminished air sounds but no clear  wheezes and crackles  cardiovascular system: Sinus tachycardia, RRR. No JVD, murmurs, rubs, gallops or  clicks. Gastrointestinal system: Abdomen is distended, soft and nontender. No organomegaly or masses felt. Normal bowel sounds heard. Central nervous system: Alert and oriented. No focal neurological deficits. Extremities: trace bilateral lower extremity edema, no clubbing ,no cyanosis Skin: No rashes, lesions or ulcers,no icterus ,no pallor   Data Reviewed: I have personally reviewed following labs and imaging studies  CBC: Recent Labs  Lab 10/10/20 2102 10/11/20 0839 10/12/20 0500  WBC 11.6* 10.4 10.0  NEUTROABS 6.6 6.5 5.8  HGB 15.1 16.2 16.5  HCT 46.7 49.1 50.7  MCV 77.4* 77.7* 76.6*  PLT 204 213 494   Basic Metabolic Panel: Recent Labs  Lab 10/10/20 2102 10/10/20 2211 10/11/20 0839 10/12/20 0832  NA 133* 135 135 134*  K 6.3* 4.1 3.9 4.0  CL 99 99 97* 98  CO2 21* 24 27 25   GLUCOSE 124* 131* 154* 156*  BUN 16 16 17 20   CREATININE 1.31* 1.08 1.16 1.24  CALCIUM 9.1 9.0 9.5 9.3   GFR: Estimated Creatinine Clearance: 88 mL/min (by C-G formula based on SCr of 1.24 mg/dL). Liver Function Tests: No results for input(s): AST, ALT, ALKPHOS, BILITOT, PROT, ALBUMIN in the last 168 hours. No results for input(s): LIPASE, AMYLASE in the last 168 hours. No results for input(s): AMMONIA in the last 168 hours. Coagulation Profile: Recent Labs  Lab 10/10/20 2138  INR 1.0   Cardiac Enzymes: No results for input(s): CKTOTAL, CKMB, CKMBINDEX, TROPONINI in the last 168 hours. BNP (last 3 results) No results for input(s): PROBNP in the last 8760 hours. HbA1C: Recent Labs    10/11/20 0521  HGBA1C 8.1*   CBG: Recent Labs  Lab 10/11/20 0738 10/11/20 1230 10/11/20 1747 10/11/20 2052 10/12/20 0744  GLUCAP 166* 158* 157* 151* 178*   Lipid Profile: No results for input(s): CHOL, HDL, LDLCALC, TRIG, CHOLHDL, LDLDIRECT in the last 72 hours. Thyroid Function Tests: Recent Labs    10/10/20 2309  TSH 5.182*   Anemia Panel: No results for input(s): VITAMINB12, FOLATE,  FERRITIN, TIBC, IRON, RETICCTPCT in the last 72 hours. Sepsis Labs: No results for input(s): PROCALCITON, LATICACIDVEN in the last 168 hours.  Recent Results (from the past 240 hour(s))  Resp Panel by RT-PCR (Flu A&B, Covid) Nasopharyngeal Swab     Status: None   Collection Time: 10/10/20  9:04 PM   Specimen: Nasopharyngeal Swab; Nasopharyngeal(NP) swabs in vial transport medium  Result Value Ref Range Status   SARS Coronavirus 2 by RT PCR NEGATIVE NEGATIVE Final    Comment: (NOTE) SARS-CoV-2 target nucleic acids are NOT DETECTED.  The SARS-CoV-2 RNA is generally detectable in upper respiratory specimens during the acute phase of infection. The lowest concentration of SARS-CoV-2 viral copies this assay can detect is 138 copies/mL. A negative result does not preclude SARS-Cov-2 infection and should not be used as the sole basis for treatment or other patient management decisions. A negative result may occur with  improper specimen collection/handling, submission of specimen other than nasopharyngeal swab, presence of viral mutation(s) within the areas targeted by this assay, and inadequate number of viral copies(<138 copies/mL). A negative result must be combined with clinical observations, patient history, and epidemiological information. The expected result is Negative.  Fact Sheet for Patients:  EntrepreneurPulse.com.au  Fact Sheet for Healthcare Providers:  IncredibleEmployment.be  This test is no t yet approved or cleared by the [place] FDA and  has been authorized for detection and/or diagnosis of SARS-CoV-2 by FDA under an Emergency Use Authorization (EUA). This EUA will remain  in effect (meaning this test can be used) for the duration of the COVID-19 declaration under Section 564(b)(1) of the Act, 21 U.S.C.section 360bbb-3(b)(1), unless the authorization is terminated  or revoked sooner.       Influenza A by PCR NEGATIVE  NEGATIVE Final   Influenza B by PCR NEGATIVE NEGATIVE Final    Comment: (NOTE) The Xpert Xpress SARS-CoV-2/FLU/RSV plus assay is intended as an aid in the diagnosis of influenza from Nasopharyngeal swab specimens and should not be used as a sole basis for treatment. Nasal washings and aspirates are unacceptable for Xpert Xpress SARS-CoV-2/FLU/RSV testing.  Fact Sheet for Patients: EntrepreneurPulse.com.au  Fact Sheet for Healthcare Providers: IncredibleEmployment.be  This test is not yet approved or cleared by the [place] FDA and has been authorized for detection and/or diagnosis of SARS-CoV-2 by FDA under an Emergency Use Authorization (EUA). This EUA will remain in effect (meaning this test can be used) for the duration of the COVID-19 declaration under Section 564(b)(1) of the Act, 21 U.S.C. section 360bbb-3(b)(1), unless the authorization is terminated or revoked.  Performed at [facility], [place] [address], [place], [place] [address]          Radiology Studies: CT Angio Chest PE W and/or Wo Contrast  Result Date: 10/10/2020 CLINICAL DATA:  PE suspected.  Increasing shortness of breath. EXAM: CT ANGIOGRAPHY CHEST WITH CONTRAST TECHNIQUE: Multidetector CT imaging of the chest was performed using the standard protocol during bolus administration of intravenous contrast. Multiplanar CT image reconstructions and MIPs were obtained to evaluate the vascular anatomy. CONTRAST:  127mL OMNIPAQUE IOHEXOL 350 MG/ML SOLN COMPARISON:  07/18/2020 FINDINGS: Cardiovascular: Contrast injection is sufficient to demonstrate satisfactory opacification of the pulmonary arteries to the segmental level. There is no pulmonary embolus or evidence of right heart strain. The size of the main pulmonary artery is normal. Moderate cardiomegaly. The course and caliber of the aorta are normal. There is no atherosclerotic calcification. Opacification  decreased due to pulmonary arterial phase contrast bolus timing. Mediastinum/Nodes: --again noted is mediastinal adenopathy which has increased in size from prior study. For example the dominant lymph node now measures 2.5 cm (previously measuring approximately 2.1 cm. -- No hilar lymphadenopathy. -- No axillary lymphadenopathy. -- No supraclavicular lymphadenopathy. -- Normal thyroid gland where visualized. -  Unremarkable esophagus. Lungs/Pleura: Airways are patent. No pleural effusion, lobar consolidation, pneumothorax or pulmonary infarction. Upper Abdomen: Contrast bolus timing is not optimized for evaluation of the abdominal organs. The visualized portions of the organs of the upper abdomen are normal. Musculoskeletal: No chest wall abnormality. No bony spinal canal stenosis. Stable postsurgical changes are noted of the midthoracic spine. Review of the MIP images confirms the above findings. IMPRESSION: 1. No evidence of acute pulmonary embolus. 2. Moderate cardiomegaly. 3. Worsening mediastinal adenopathy as previously evaluated on the patient's PET-CT. Electronically Signed   By: Constance Holster M.D.   On: 10/10/2020 23:01  US Abdomen Limited  Result Date: 10/11/2020 CLINICAL DATA:  Possible ascites EXAM: LIMITED ABDOMEN ULTRASOUND FOR ASCITES TECHNIQUE: Limited ultrasound survey for ascites was performed in all four abdominal quadrants. COMPARISON:  None. FINDINGS: No evidence of ascites. IMPRESSION: No ascites is identified. Electronically Signed   By: Inez Catalina M.D.   On: 10/11/2020 12:18   DG Chest Port 1 View  Result Date: 10/10/2020 CLINICAL DATA:  54 year old male with shortness of breath. EXAM: PORTABLE CHEST 1 VIEW COMPARISON:  Chest radiograph dated 01/06/2007. FINDINGS: Cardiomegaly with vascular congestion. No focal consolidation, pleural effusion or pneumothorax. No acute osseous pathology. IMPRESSION: Cardiomegaly with vascular congestion. No focal consolidation. Electronically  Signed   By: Anner Crete M.D.   On: 10/10/2020 21:20   ECHOCARDIOGRAM COMPLETE  Result Date: 10/11/2020    ECHOCARDIOGRAM REPORT   Patient Name:   Charles [name] Date of Exam: 10/11/2020 Medical Rec #:  [id number]   Height:       69.0 in Accession #:    [id number]  Weight:       281.3 lb Date of Birth:  1966/08/17    BSA:          2.388 m Patient Age:    63 years    BP:           146/92 mmHg Patient Gender: M           HR:           120 bpm. Exam Location:  Inpatient Procedure: 2D Echo  Results reported to Dr Tawanna Solo. Indications:    CHF 428  History:        Patient has no prior history of Echocardiogram examinations.                 Risk Factors:Diabetes, Hypertension and Current Smoker.  Sonographer:    Jannett Celestine RDCS (AE) Referring Phys: [id number] JARED M GARDNER  Sonographer Comments: [name] morbidly obese. Image acquisition challenging due to patient body habitus. patient had difficulty remaining awake during exam IMPRESSIONS  1. Left ventricular ejection fraction, by estimation, is 20 to 25%. The left ventricle has severely decreased function. The left ventricle demonstrates global hypokinesis. The left ventricular internal cavity size was severely dilated. Left ventricular diastolic parameters are indeterminate.  2. Right ventricular systolic function was not well visualized. The right ventricular size is not well visualized.  3. The mitral valve is normal in structure. No evidence of mitral valve regurgitation.  4. The aortic valve was not well visualized. Aortic valve regurgitation is not visualized. No aortic stenosis is present. FINDINGS  Left Ventricle: Left ventricular ejection fraction, by estimation, is 20 to 25%. The left ventricle has severely decreased function. The left ventricle demonstrates global hypokinesis. Definity contrast agent was given IV to delineate the left ventricular endocardial borders. The left ventricular internal cavity size was severely dilated. There is no left  ventricular hypertrophy. Left ventricular diastolic parameters are indeterminate. Right Ventricle: The right ventricular size is not well visualized. Right vetricular wall thickness was not assessed. Right ventricular systolic function was not well visualized. Left Atrium: Left atrial size was not well visualized. Right Atrium: Right atrial size was not well visualized. Pericardium: There is no evidence of pericardial effusion. Mitral Valve: The mitral valve is normal in structure. No evidence of mitral valve regurgitation. Tricuspid Valve: The tricuspid valve is normal in structure. Tricuspid valve regurgitation is trivial. Aortic Valve: The aortic valve was not well visualized. Aortic valve regurgitation is not  visualized. No aortic stenosis is present. Pulmonic Valve: The pulmonic valve was not assessed. Pulmonic valve regurgitation is not visualized. Aorta: The aortic root is normal in size and structure. IAS/Shunts: The interatrial septum was not well visualized.  LEFT VENTRICLE PLAX 2D LVIDd:         7.00 cm LVIDs:         6.05 cm LV PW:         0.90 cm LV IVS:        0.90 cm LVOT diam:     1.90 cm LV SV:         25 LV SV Index:   10 LVOT Area:     2.84 cm  LEFT ATRIUM         Index LA diam:    3.30 cm 1.38 cm/m  AORTIC VALVE LVOT Vmax:   68.50 cm/s LVOT Vmean:  46.200 cm/s LVOT VTI:    0.087 m  AORTA Ao Root diam: 3.20 cm MITRAL VALVE MV Area (PHT): 6.54 cm    SHUNTS MV Decel Time: 116 msec    Systemic VTI:  0.09 m MV E velocity: 73.70 cm/s  Systemic Diam: 1.90 cm Oswaldo Milian MD Electronically signed by Oswaldo Milian MD Signature Date/Time: 10/11/2020/4:44:36 PM    Final         Scheduled Meds: . atorvastatin  10 mg Oral Daily  . brimonidine  1 drop Both Eyes BID  . carvedilol  6.25 mg Oral BID WC  . cyclobenzaprine  10 mg Oral TID  . enoxaparin (LOVENOX) injection  60 mg Subcutaneous Q24H  . feeding supplement  237 mL Oral BID BM  . furosemide  60 mg Intravenous Q12H  .  insulin aspart  0-15 Units Subcutaneous TID WC  . latanoprost  1 drop Both Eyes QHS  . levothyroxine  100 mcg Oral Q0600  . [START ON 10/13/2020] losartan  25 mg Oral Daily  . melatonin  3 mg Oral QHS  . polyethylene glycol  17 g Oral Daily  . risperiDONE  0.5 mg Oral QHS  . tamsulosin  0.4 mg Oral Daily   Continuous Infusions:   LOS: 1 day    Time spent: 25 mins.More than 50% of that time was spent in counseling and/or coordination of care.      Shelly Coss, MD [facility] P12/03/2020, 11:25 AM

## 2020-10-12 NOTE — Progress Notes (Signed)
Pt in yellow mews do to HR charge nurse hilda and MD aware. I will continue to monitor.

## 2020-10-12 NOTE — Consult Note (Signed)
Cardiology Consultation:   Patient ID: Charles [name] MRN: [id number]; DOB: [birth year]  Admit date: 10/10/2020 Date of Consult: 10/12/2020  Primary Care Provider: Patient, No Pcp Per [place] Cardiologist: New to [place]; [name] Electrophysiologist:  None   Patient Profile:   Charles [name] is a 54 y.o. male with a PMH of HTN, HLD, DM type 2, hypothyroidism, BPH, bipolar disorder/paranoid schizophrenia, seizure disorder, chronic back pain, and tobacco abuse, who is being seen today for the evaluation of CHF at the request of Charles [name].  History of Present Illness:   Charles [name] currently resides at an assisted living facility. He was seen in the ED at [facility] 07/18/20 with bilateral LE edema and SOB for the past 2 weeks. He underwent CTA Chest which revealed mediastinal lymphadenopathy c/f potential intra-abdominal mass or primary lesion, though no evidence of PE and specifically no atherosclerotic calcifications noted. Further imaging with a CT A/P was unrevealing. 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 CT surgery appointment was scheduled for 10/08/20, then cancelled and rescheduled for 11/04/20. He has had night sweats and subjective fevers for the past month.   He presented to [facility] ED via EMS for evaluation of DOE for the past 2 weeks.   Hospital course: afebrile, tachycardic in the 110s-120s, intermittently hypertensive, intermittently tachypneic, satting in the 90s on 2L via [place]. Labs notable for electrolytes wnl, Cr 1.08>1.24, Hgb 16.5, PLT 156, A1C 8.1, TSH 5.18, BNP 97, HsTrop 28>25. EKG with sinus rhythm, rate 114 bpm, non-specific T wave abnormalities, no STE/D, QTc 506.  CXR showed cardiomegaly with vascular congestion. CTA Chest with no PE,  moderate cardiomegaly, and worsening mediastinal adenopathy. He was admitted to medicine and started on IV lasix 60mg  BID. UOP is - 1.1L in the past 24 hours and -2.6L this admission. Echo this admission showed EF 20-25%, global hypokinesis, severe LV dilation, indeterminate LV diastolic function, and no significant valvular abnormalities; no comparison study. Cardiology asked to evaluate.  He tells me that the only chest pain he has had is after he coughs too much. He does not recall ever being told that he has issues with his heart. He does note that his heart has been going faster recently, and he notes that he has had more shortness of breath and leg swelling in the last few weeks. He is awaiting a biopsy to determine if his enlarged lymph nodes are cancerous.  Denies syncope or palpitations. Endorses chest pain after coughing, shortness of breath at rest and with exertion, and LE edema. He thinks his breathing had been worse at night but cannot elaborate.  Past Medical History:  Diagnosis Date  . Bipolar 1 disorder ([place])   . DM2 (diabetes mellitus, type 2) ([place])   . HTN (hypertension)     Past Surgical History:  Procedure Laterality Date  . SPINAL FIXATION SURGERY  02/26/2020     Home Medications:  Prior to Admission medications   Medication Sig Start Date End Date Taking? Authorizing Provider  acetaminophen (TYLENOL) 325 MG tablet Take 650 mg by mouth every 6 (six) hours as needed.   Yes [provider]  brimonidine (ALPHAGAN) 0.2 % ophthalmic solution Place 1 drop into both eyes 2 (two) times daily.    Yes [provider]  cyclobenzaprine (  FLEXERIL) 10 MG tablet Take 10 mg by mouth 3 (three) times daily.   Yes [provider]  hydrochlorothiazide (HYDRODIURIL) 25 MG tablet Take 25 mg by mouth daily.   Yes [provider]  levothyroxine (SYNTHROID) 100 MCG tablet Take 100 mcg by mouth daily before breakfast.   Yes [provider]   lisinopril (ZESTRIL) 2.5 MG tablet Take 2.5 mg by mouth daily.   Yes [provider]  metFORMIN (GLUCOPHAGE) 500 MG tablet Take 500 mg by mouth 2 (two) times daily with a meal.   Yes [provider]  metoprolol tartrate (LOPRESSOR) 25 MG tablet Take 25 mg by mouth 2 (two) times daily. 09/08/20  Yes [provider]  Paliperidone Palmitate (INVEGA SUSTENNA IM) Inject 1 pen into the muscle every 30 (thirty) days.     Yes [provider]  tamsulosin (FLOMAX) 0.4 MG CAPS capsule Take 0.4 mg by mouth daily. 09/08/20  Yes [provider]  atorvastatin (LIPITOR) 10 MG tablet Take 10 mg by mouth daily. 09/30/20   [provider]  latanoprost (XALATAN) 0.005 % ophthalmic solution Place 1 drop into both eyes at bedtime.     [provider]  melatonin 3 MG TABS tablet Take 1 tablet by mouth at bedtime. 09/08/20   [provider]  risperiDONE (RISPERDAL) 0.5 MG tablet Take 0.5 mg by mouth at bedtime.    [provider]    Inpatient Medications: Scheduled Meds: . atorvastatin  10 mg Oral Daily  . brimonidine  1 drop Both Eyes BID  . carvedilol  6.25 mg Oral BID WC  . cyclobenzaprine  10 mg Oral TID  . enoxaparin (LOVENOX) injection  60 mg Subcutaneous Q24H  . feeding supplement  237 mL Oral BID BM  . furosemide  60 mg Intravenous Q12H  . insulin aspart  0-15 Units Subcutaneous TID WC  . latanoprost  1 drop Both Eyes QHS  . levothyroxine  100 mcg Oral Q0600  . [START ON 10/13/2020] losartan  25 mg Oral Daily  . melatonin  3 mg Oral QHS  . polyethylene glycol  17 g Oral Daily  . risperiDONE  0.5 mg Oral QHS  . tamsulosin  0.4 mg Oral Daily   Continuous Infusions:  PRN Meds: acetaminophen **OR** acetaminophen, guaiFENesin-codeine, ondansetron **OR** ondansetron (ZOFRAN) IV  Allergies:    Allergies  Allergen Reactions  . Ibuprofen   . Penicillins Rash    Social History:   Social History   Socioeconomic History   . Marital status: Divorced    Spouse name: Not on file  . Number of children: 1  . Years of education: Not on file  . Highest education level: Not on file  Occupational History  . Not on file  Tobacco Use  . Smoking status: Current Every Day Smoker    Packs/day: 0.25    Types: Cigarettes  . Smokeless tobacco: Never Used  Vaping Use  . Vaping Use: Never used  Substance and Sexual Activity  . Alcohol use: Not on file  . Drug use: Yes    Types: Cocaine    Comment: $800.00 in last 3 dyas  . Sexual activity: Not on file  Other Topics Concern  . Not on file  Social History Narrative  . Not on file   Social Determinants of Health   Financial Resource Strain:   . Difficulty of Paying Living Expenses: Not on file  Food Insecurity:   . Worried About Charity fundraiser in the  Last Year: Not on file  . Ran Out of Food in the Last Year: Not on file  Transportation Needs:   . Lack of Transportation (Medical): Not on file  . Lack of Transportation (Non-Medical): Not on file  Physical Activity:   . Days of Exercise per Week: Not on file  . Minutes of Exercise per Session: Not on file  Stress:   . Feeling of Stress : Not on file  Social Connections:   . Frequency of Communication with Friends and Family: Not on file  . Frequency of Social Gatherings with Friends and Family: Not on file  . Attends Religious Services: Not on file  . Active Member of Clubs or Organizations: Not on file  . Attends Archivist Meetings: Not on file  . Marital Status: Not on file  Intimate Partner Violence:   . Fear of Current or Ex-Partner: Not on file  . Emotionally Abused: Not on file  . Physically Abused: Not on file  . Sexually Abused: Not on file    Family History:    Family History  Problem Relation Age of Onset  . Hypertension Mother   . Hyperlipidemia Mother   . Stroke Mother   . Cancer Mother   . Hypertension Father   . Hyperlipidemia Father   . Healthy Sister       ROS:  Please see the history of present illness.  Constitutional: Positive for night sweats HENT: Negative for ear pain and hearing loss.   Eyes: Negative for loss of vision and eye pain.  Respiratory: Positive for cough, shortness of breath.  Cardiovascular: See HPI. Gastrointestinal: Negative for abdominal pain, melena, and hematochezia. Has felt abdominal distension Genitourinary: Negative for dysuria and hematuria.  Musculoskeletal: Negative for falls and myalgias.  Skin: Negative for itching and rash.  Neurological: Negative for focal weakness, focal sensory changes and loss of consciousness.  Endo/Heme/Allergies: Does not bruise/bleed easily.  All other ROS reviewed and negative.     Physical Exam/Data:   Vitals:   10/11/20 2027 10/12/20 0126 10/12/20 0430 10/12/20 1255  BP: 129/81 115/72 (!) 126/94 133/85  Pulse: (!) 119 (!) 110 (!) 110 (!) 119  Resp: 20 20  16   Temp: 97.8 F (36.6 C) 98.2 F (36.8 C) 98.3 F (36.8 C) 97.7 F (36.5 C)  TempSrc: Oral Oral Oral Oral  SpO2: 97% 96% 99% 96%  Weight:      Height:        Intake/Output Summary (Last 24 hours) at 10/12/2020 1421 Last data filed at 10/12/2020 1252 Gross per 24 hour  Intake 840 ml  Output 1950 ml  Net -1110 ml   Last 3 Weights 10/10/2020 09/25/2020  Weight (lbs) 281 lb 4.8 oz 281 lb 4.8 oz  Weight (kg) 127.597 kg 127.597 kg     Body mass index is 41.54 kg/m.  General:  Well nourished, well developed, in no acute distress HEENT: [place] in place Neck: difficult to visualize JVD due to body habitus Endocrine:  No thryomegaly appreciated Vascular: No carotid bruits; RA pulses 2+ bilaterally Cardiac:  normal S1, S2; RRR; no murmur  Lungs:  clear to auscultation bilaterally, no wheezing, rhonchi or rales  Abd: soft, nontender, mildly distended Ext: no significant LE edema Musculoskeletal:  No deformities, BUE and BLE strength normal and equal Skin: warm and dry  Neuro:  CNs 2-12 intact, no focal  abnormalities noted Psych:  Slightly blunted affect  EKG:  The EKG was personally reviewed and demonstrates:  sinus  rhythm, rate 114 bpm, non-specific T wave abnormalities, no STE/D, QTc 506.    Telemetry:  Telemetry was personally reviewed and demonstrates:  Sinus tachycardia  Relevant CV Studies: Echocardiogram 10/11/20: 1. Left ventricular ejection fraction, by estimation, is 20 to 25%. The  left ventricle has severely decreased function. The left ventricle  demonstrates global hypokinesis. The left ventricular internal cavity size  was severely dilated. Left ventricular  diastolic parameters are indeterminate.  2. Right ventricular systolic function was not well visualized. The right  ventricular size is not well visualized.  3. The mitral valve is normal in structure. No evidence of mitral valve  regurgitation.  4. The aortic valve was not well visualized. Aortic valve regurgitation  is not visualized. No aortic stenosis is present.  Laboratory Data:  High Sensitivity Troponin:   Recent Labs  Lab 10/11/20 0254 10/11/20 0521  TROPONINIHS 28* 25*     Chemistry Recent Labs  Lab 10/10/20 2211 10/11/20 0839 10/12/20 0832  NA 135 135 134*  K 4.1 3.9 4.0  CL 99 97* 98  CO2 24 27 25   GLUCOSE 131* 154* 156*  BUN 16 17 20   CREATININE 1.08 1.16 1.24  CALCIUM 9.0 9.5 9.3  GFRNONAA >60 >60 >60  ANIONGAP 12 11 11     No results for input(s): PROT, ALBUMIN, AST, ALT, ALKPHOS, BILITOT in the last 168 hours. Hematology Recent Labs  Lab 10/10/20 2102 10/11/20 0839 10/12/20 0500  WBC 11.6* 10.4 10.0  RBC 6.03* 6.32* 6.62*  HGB 15.1 16.2 16.5  HCT 46.7 49.1 50.7  MCV 77.4* 77.7* 76.6*  MCH 25.0* 25.6* 24.9*  MCHC 32.3 33.0 32.5  RDW 19.3* 19.5* 19.7*  PLT 204 213 156   BNP Recent Labs  Lab 10/10/20 2102  BNP 97.4    DDimer No results for input(s): DDIMER in the last 168 hours.   Radiology/Studies:  CT Angio Chest PE W and/or Wo Contrast  Result Date:  10/10/2020 CLINICAL DATA:  PE suspected.  Increasing shortness of breath. EXAM: CT ANGIOGRAPHY CHEST WITH CONTRAST TECHNIQUE: Multidetector CT imaging of the chest was performed using the standard protocol during bolus administration of intravenous contrast. Multiplanar CT image reconstructions and MIPs were obtained to evaluate the vascular anatomy. CONTRAST:  136mL OMNIPAQUE IOHEXOL 350 MG/ML SOLN COMPARISON:  07/18/2020 FINDINGS: Cardiovascular: Contrast injection is sufficient to demonstrate satisfactory opacification of the pulmonary arteries to the segmental level. There is no pulmonary embolus or evidence of right heart strain. The size of the main pulmonary artery is normal. Moderate cardiomegaly. The course and caliber of the aorta are normal. There is no atherosclerotic calcification. Opacification decreased due to pulmonary arterial phase contrast bolus timing. Mediastinum/Nodes: --again noted is mediastinal adenopathy which has increased in size from prior study. For example the dominant lymph node now measures 2.5 cm (previously measuring approximately 2.1 cm. -- No hilar lymphadenopathy. -- No axillary lymphadenopathy. -- No supraclavicular lymphadenopathy. -- Normal thyroid gland where visualized. -  Unremarkable esophagus. Lungs/Pleura: Airways are patent. No pleural effusion, lobar consolidation, pneumothorax or pulmonary infarction. Upper Abdomen: Contrast bolus timing is not optimized for evaluation of the abdominal organs. The visualized portions of the organs of the upper abdomen are normal. Musculoskeletal: No chest wall abnormality. No bony spinal canal stenosis. Stable postsurgical changes are noted of the midthoracic spine. Review of the MIP images confirms the above findings. IMPRESSION: 1. No evidence of acute pulmonary embolus. 2. Moderate cardiomegaly. 3. Worsening mediastinal adenopathy as previously evaluated on the patient's PET-CT. Electronically Signed  By: Constance Holster M.D.    On: 10/10/2020 23:01   US Abdomen Limited  Result Date: 10/11/2020 CLINICAL DATA:  Possible ascites EXAM: LIMITED ABDOMEN ULTRASOUND FOR ASCITES TECHNIQUE: Limited ultrasound survey for ascites was performed in all four abdominal quadrants. COMPARISON:  None. FINDINGS: No evidence of ascites. IMPRESSION: No ascites is identified. Electronically Signed   By: Inez Catalina M.D.   On: 10/11/2020 12:18   DG Chest Port 1 View  Result Date: 10/10/2020 CLINICAL DATA:  54 year old male with shortness of breath. EXAM: PORTABLE CHEST 1 VIEW COMPARISON:  Chest radiograph dated 01/06/2007. FINDINGS: Cardiomegaly with vascular congestion. No focal consolidation, pleural effusion or pneumothorax. No acute osseous pathology. IMPRESSION: Cardiomegaly with vascular congestion. No focal consolidation. Electronically Signed   By: Anner Crete M.D.   On: 10/10/2020 21:20   ECHOCARDIOGRAM COMPLETE  Result Date: 10/11/2020    ECHOCARDIOGRAM REPORT   Patient Name:   CHRISTERPHER CLOS Date of Exam: 10/11/2020 Medical Rec #:  [id number]   Height:       69.0 in Accession #:    [id number]  Weight:       281.3 lb Date of Birth:  1966-10-08    BSA:          2.388 m Patient Age:    75 years    BP:           146/92 mmHg Patient Gender: M           HR:           120 bpm. Exam Location:  Inpatient Procedure: 2D Echo  Results reported to Dr Tawanna [name]. Indications:    CHF 428  History:        Patient has no prior history of Echocardiogram examinations.                 Risk Factors:Diabetes, Hypertension and Current Smoker.  Sonographer:    Jannett Celestine RDCS (AE) Referring Phys: [phone] JARED M GARDNER  Sonographer Comments: [name] morbidly obese. Image acquisition challenging due to patient body habitus. patient had difficulty remaining awake during exam IMPRESSIONS  1. Left ventricular ejection fraction, by estimation, is 20 to 25%. The left ventricle has severely decreased function. The left ventricle demonstrates global hypokinesis. The  left ventricular internal cavity size was severely dilated. Left ventricular diastolic parameters are indeterminate.  2. Right ventricular systolic function was not well visualized. The right ventricular size is not well visualized.  3. The mitral valve is normal in structure. No evidence of mitral valve regurgitation.  4. The aortic valve was not well visualized. Aortic valve regurgitation is not visualized. No aortic stenosis is present. FINDINGS  Left Ventricle: Left ventricular ejection fraction, by estimation, is 20 to 25%. The left ventricle has severely decreased function. The left ventricle demonstrates global hypokinesis. Definity contrast agent was given IV to delineate the left ventricular endocardial borders. The left ventricular internal cavity size was severely dilated. There is no left ventricular hypertrophy. Left ventricular diastolic parameters are indeterminate. Right Ventricle: The right ventricular size is not well visualized. Right vetricular wall thickness was not assessed. Right ventricular systolic function was not well visualized. Left Atrium: Left atrial size was not well visualized. Right Atrium: Right atrial size was not well visualized. Pericardium: There is no evidence of pericardial effusion. Mitral Valve: The mitral valve is normal in structure. No evidence of mitral valve regurgitation. Tricuspid Valve: The tricuspid valve is normal in structure. Tricuspid valve regurgitation is trivial. Aortic Valve:  The aortic valve was not well visualized. Aortic valve regurgitation is not visualized. No aortic stenosis is present. Pulmonic Valve: The pulmonic valve was not assessed. Pulmonic valve regurgitation is not visualized. Aorta: The aortic root is normal in size and structure. IAS/Shunts: The interatrial septum was not well visualized.  LEFT VENTRICLE PLAX 2D LVIDd:         7.00 cm LVIDs:         6.05 cm LV PW:         0.90 cm LV IVS:        0.90 cm LVOT diam:     1.90 cm LV SV:          25 LV SV Index:   10 LVOT Area:     2.84 cm  LEFT ATRIUM         Index LA diam:    3.30 cm 1.38 cm/m  AORTIC VALVE LVOT Vmax:   68.50 cm/s LVOT Vmean:  46.200 cm/s LVOT VTI:    0.087 m  AORTA Ao Root diam: 3.20 cm MITRAL VALVE MV Area (PHT): 6.54 cm    SHUNTS MV Decel Time: 116 msec    Systemic VTI:  0.09 m MV E velocity: 73.70 cm/s  Systemic Diam: 1.90 cm Oswaldo Milian MD Electronically signed by Oswaldo Milian MD Signature Date/Time: 10/11/2020/4:44:36 PM    Final      Assessment and Plan:   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 Previous issues with LE edema 07/2020 which was managed with a 10 day course of po lasix. 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  -No prior cardiac history. Imaging with pulmonary vascular congestion and progression of his mediastinal lymphadenopathy.  -He was started on IV lasix with -2.6L UOP this admission. Echo showed EF 20-25% with severe LV dilation, though no evidence of pericardial effusion.  -No prior history of CAD and CTA Chest 07/2020 specifically notes no presences of atherosclerotic calcifications, however he does have a history of HTN, HLD, DM type 2, and tobacco abuse. He has also been persistently tachycardic with HR in the 100s-120s on ED/Office visits since 07/2020, though appears to be sinus tachycardia. Etiology remains unclear at this time. Home metoprolol tartrate transitioned to carvedilol and lisinopril increased to 5mg . - Favor ongoing diuresis with IV lasix 60mg  BID given good UOP response - Will add daily weights in addition to strict I&Os - Continue to monitor electolytes closely and replete as needed to maintain K <4, Mg >2 - Will check FLP in AM for further risk stratification - Will add Free T3/T4 to AM labs given elevated TSH - Will transition from  lisinopril to losartan tomorrow for ease of transition to entresto going forward - May need to consider transition back to metoprolol (succinate) for improved HR control as this is a possible contributor to his cardiomyopathy. Can continue carvedilol for now, but if blood pressure is low, would then transition to metoprolol succinate to allow for more blood pressure room for guideline directed medical therapy -once nearly euvolemic, will need ischemic evaluation. Options would be left/right heart cath vs. Coronary CTA given lack of calcium noted on prior imaging. 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 Planned for outpatient appt with CT surgery 11/04/20 to determine biopsy plan. Now with progression of disease on CT this admission. - Continue management per primary team.   For questions or updates, please contact [place] Please consult [email or web address] for contact info under    Signed, [name], MD  10/12/2020 2:21 PM Note prepared by Roby Lofts, PA, with additions/updates of my own

## 2020-10-12 NOTE — Plan of Care (Signed)

## 2020-10-13 DIAGNOSIS — I5021 Acute systolic (congestive) heart failure: Secondary | ICD-10-CM | POA: Diagnosis not present

## 2020-10-13 LAB — CBC WITH DIFFERENTIAL/PLATELET
Abs Immature Granulocytes: 0.05 10*3/uL (ref 0.00–0.07)
Basophils Absolute: 0.1 10*3/uL (ref 0.0–0.1)
Basophils Relative: 1 %
Eosinophils Absolute: 0.1 10*3/uL (ref 0.0–0.5)
Eosinophils Relative: 1 %
HCT: 49.9 % (ref 39.0–52.0)
Hemoglobin: 16.1 g/dL (ref 13.0–17.0)
Immature Granulocytes: 1 %
Lymphocytes Relative: 37 %
Lymphs Abs: 3.6 10*3/uL (ref 0.7–4.0)
MCH: 24.9 pg — ABNORMAL LOW (ref 26.0–34.0)
MCHC: 32.3 g/dL (ref 30.0–36.0)
MCV: 77.2 fL — ABNORMAL LOW (ref 80.0–100.0)
Monocytes Absolute: 0.8 10*3/uL (ref 0.1–1.0)
Monocytes Relative: 8 %
Neutro Abs: 5.1 10*3/uL (ref 1.7–7.7)
Neutrophils Relative %: 52 %
Platelets: 204 10*3/uL (ref 150–400)
RBC: 6.46 MIL/uL — ABNORMAL HIGH (ref 4.22–5.81)
RDW: 19.6 % — ABNORMAL HIGH (ref 11.5–15.5)
WBC: 9.8 10*3/uL (ref 4.0–10.5)
nRBC: 0 % (ref 0.0–0.2)

## 2020-10-13 LAB — BASIC METABOLIC PANEL
Anion gap: 15 (ref 5–15)
BUN: 22 mg/dL — ABNORMAL HIGH (ref 6–20)
CO2: 25 mmol/L (ref 22–32)
Calcium: 9.2 mg/dL (ref 8.9–10.3)
Chloride: 96 mmol/L — ABNORMAL LOW (ref 98–111)
Creatinine, Ser: 1.21 mg/dL (ref 0.61–1.24)
GFR, Estimated: >60 mL/min (ref >60)
Glucose, Bld: 170 mg/dL — ABNORMAL HIGH (ref 70–99)
Potassium: 3.9 mmol/L (ref 3.5–5.1)
Sodium: 136 mmol/L (ref 135–145)

## 2020-10-13 LAB — LIPID PANEL
Cholesterol: 217 mg/dL — ABNORMAL HIGH (ref 0–200)
HDL: 41 mg/dL (ref >40)
LDL Cholesterol: 116 mg/dL — ABNORMAL HIGH (ref 0–99)
Total CHOL/HDL Ratio: 5.3 RATIO
Triglycerides: 302 mg/dL — ABNORMAL HIGH (ref <150)
VLDL: 60 mg/dL — ABNORMAL HIGH (ref 0–40)

## 2020-10-13 LAB — GLUCOSE, CAPILLARY
Glucose-Capillary: 168 mg/dL — ABNORMAL HIGH (ref 70–99)
Glucose-Capillary: 169 mg/dL — ABNORMAL HIGH (ref 70–99)
Glucose-Capillary: 158 mg/dL — ABNORMAL HIGH (ref 70–99)
Glucose-Capillary: 219 mg/dL — ABNORMAL HIGH (ref 70–99)

## 2020-10-13 LAB — T3, FREE: T3, Free: 4 pg/mL (ref 2.0–4.4)

## 2020-10-13 MED ORDER — ALUM & MAG HYDROXIDE-SIMETH 200-200-20 MG/5ML PO SUSP
30.0000 mL | Freq: Four times a day (QID) | ORAL | Status: DC | PRN
  Administered 2020-10-15: 30 mL via ORAL
  Filled 2020-10-13: qty 30

## 2020-10-13 MED ORDER — SODIUM CHLORIDE 0.9% FLUSH: 3.0000 mL | INTRAVENOUS | Status: DC | PRN

## 2020-10-13 MED ORDER — SODIUM CHLORIDE 0.9 % IV SOLN: 250.0000 mL | INTRAVENOUS | Status: DC | PRN

## 2020-10-13 MED ORDER — SPIRONOLACTONE 12.5 MG HALF TABLET
12.5000 mg | ORAL_TABLET | Freq: Every day | ORAL | Status: DC
  Administered 2020-10-13: 12.5 mg via ORAL
  Filled 2020-10-13 (×2): qty 1

## 2020-10-13 MED ORDER — ASPIRIN 81 MG PO CHEW
81.0000 mg | CHEWABLE_TABLET | ORAL | Status: AC
  Administered 2020-10-14: 81 mg via ORAL
  Filled 2020-10-13: qty 1

## 2020-10-13 MED ORDER — SODIUM CHLORIDE 0.9% FLUSH
3.0000 mL | Freq: Two times a day (BID) | INTRAVENOUS | Status: DC
  Administered 2020-10-13 – 2020-10-20 (×7): 3 mL via INTRAVENOUS

## 2020-10-13 MED ORDER — SODIUM CHLORIDE 0.9 % IV SOLN: INTRAVENOUS | Status: DC

## 2020-10-13 MED ORDER — DIGOXIN 125 MCG PO TABS
0.1250 mg | ORAL_TABLET | Freq: Every day | ORAL | Status: DC
  Administered 2020-10-13 – 2020-10-23 (×11): 0.125 mg via ORAL
  Filled 2020-10-13 (×11): qty 1

## 2020-10-13 NOTE — Progress Notes (Signed)
PROGRESS NOTE    BRANTLEY WILEY  NWG:[id number] DOB: 1965-12-18 DOA: 10/10/2020 PCP: Patient, No Pcp Per   Chief Complain: Shortness of breath  Brief Narrative: [name] a 54 year old male with history of hypertension, diabetes type 2, obesity who presented to the emergency department with complaints of shortness of breath.  He was recently suspected to have lymphoma due to finding of metastatic lymphadenopathy and was following with oncology and has been planned for biopsy by CT surgery. He follows with Dr. Lorenso Courier.On presentation he was tachycardic, hypoxic needing 2 L of oxygen per minute.  Chest x-ray showed possible pulmonary edema.  Also was endorsing shortness of breath.CT angiogram of the chest did not show any PE but showed worsening mediastinal lymphadenopathy. Echocardiogram showed severe reduction in ejection fraction of 20 to 25%.  Currently being diuresed with IV Lasix.  Cardiology consulted and following.Plan for right and left heart catheterization tomorrow.  Assessment & Plan:   Principal Problem:   Shortness of breath Active Problems:   Hypertension   Mediastinal lymphadenopathy   Sinus tachycardia   Night sweats   DM2 (diabetes mellitus, type 2) (HCC)   Lymphoma (HCC)   Acute congestive heart failure with reduced ejection fraction: Presented with dyspnea.  Appeared volume overloaded on presentation but BNP was normal.  Echo done here showed ejection fraction of 20 to 25% was a new finding.  Cardiology consulted.  Continue IV Lasix because he still appears well overloaded.  Started on losartan with plan for transitioning to [facility] tomorrow.  Also started on digoxin, spironolactone. carvedilol discontinued in the setting of acute heart failure.  Plan for right and left heart catheterization tomorrow.  Acute hypoxic respiratory failure: Suspected to be from CHF exacerbation.  CT angiogram of the chest did not show any PE.  Currently on 2 L of oxygen per  minute.  Mediastinal lymphadenopathy: Follows with oncology.  High suspicion for Hodgkin's lymphoma.  CT imaging showed progressive lymphadenopathy.  [name] following up with CT surgery for the biopsy of the lymph nodes.  We recommend to follow-up with CT surgery and oncology as an outpatient  Hypertension: Currently well controlled.  Continue current medications.  Patient has sinus tachycardia.  Diabetes type 2: On Metformin at home.  Continue sliding scale insulin.  Hemoglobin A1c of 8.1.  Hypothyroidism: Continue Synthyroid.  Mild elevated TSH level.Free t4 mild elevated.  Results inconclusive  BPH: Flomax  Hyperlipidemia: Takes Lipitor at home.  Morbid obesity: BMI of 41.5          DVT prophylaxis: Lovenox Code Status: Full Family Communication: None at bedside Status is: Inpatient  Remains inpatient appropriate because:Inpatient level of care appropriate due to severity of illness   Dispo: The patient is from: Home( ALF)              Anticipated d/c is to: Home              Anticipated d/c date is: 1-2 days              Patient currently is not medically stable to d/c.     Consultants: Cardiology  Procedures: None  Antimicrobials:  Anti-infectives (From admission, onward)   None      Subjective: Patient seen and examined the bedside this morning, hemodynamically stable.  Feels better today.  Complains of some central chest pain.  Respiratory status is improved.  He still appears volume overloaded.  Objective: Vitals:   10/12/20 1255 10/12/20 1700 10/12/20 2127 10/13/20 5784  BP: 133/85  126/85 109/85  Pulse: (!) 119  (!) 115 (!) 114  Resp: 16  16 15   Temp: 97.7 F (36.5 C)  98.6 F (37 C) 97.9 F (36.6 C)  TempSrc: Oral  Oral Oral  SpO2: 96%  95% 93%  Weight:  123.6 kg  123.9 kg  Height:        Intake/Output Summary (Last 24 hours) at 10/13/2020 0754 Last data filed at 10/13/2020 0200 Gross per 24 hour  Intake 1557 ml  Output 1800 ml  Net  -243 ml   Filed Weights   10/10/20 2105 10/12/20 1700 10/13/20 0511  Weight: 127.6 kg 123.6 kg 123.9 kg    Examination:  General exam: Not in distress,average built,morbidly obese HEENT:PERRL,Oral mucosa moist, Ear/Nose normal on gross exam Respiratory system: Bilateral equal air entry, normal vesicular breath sounds, no wheezes or crackles  Cardiovascular system: S1 & S2 heard, RRR. No JVD, murmurs, rubs, gallops or clicks. Gastrointestinal system: Abdomen is nondistended, soft and nontender. No organomegaly or masses felt. Normal bowel sounds heard. Central nervous system: Alert and oriented. No focal neurological deficits. Extremities: No edema, no clubbing ,no cyanosis Skin: No rashes, lesions or ulcers,no icterus ,no pallor  Data Reviewed: I have personally reviewed following labs and imaging studies  CBC: Recent Labs  Lab 10/10/20 2102 10/11/20 0839 10/12/20 0500 10/13/20 0453  WBC 11.6* 10.4 10.0 9.8  NEUTROABS 6.6 6.5 5.8 5.1  HGB 15.1 16.2 16.5 16.1  HCT 46.7 49.1 50.7 49.9  MCV 77.4* 77.7* 76.6* 77.2*  PLT 204 213 156 381   Basic Metabolic Panel: Recent Labs  Lab 10/10/20 2102 10/10/20 2211 10/11/20 0839 10/12/20 0832 10/13/20 0453  NA 133* 135 135 134* 136  K 6.3* 4.1 3.9 4.0 3.9  CL 99 99 97* 98 96*  CO2 21* 24 27 25 25   GLUCOSE 124* 131* 154* 156* 170*  BUN 16 16 17 20  22*  CREATININE 1.31* 1.08 1.16 1.24 1.21  CALCIUM 9.1 9.0 9.5 9.3 9.2   GFR: Estimated Creatinine Clearance: 88.7 mL/min (by C-G formula based on SCr of 1.21 mg/dL). Liver Function Tests: No results for input(s): AST, ALT, ALKPHOS, BILITOT, PROT, ALBUMIN in the last 168 hours. No results for input(s): LIPASE, AMYLASE in the last 168 hours. No results for input(s): AMMONIA in the last 168 hours. Coagulation Profile: Recent Labs  Lab 10/10/20 2138  INR 1.0   Cardiac Enzymes: No results for input(s): CKTOTAL, CKMB, CKMBINDEX, TROPONINI in the last 168 hours. BNP (last 3  results) No results for input(s): PROBNP in the last 8760 hours. HbA1C: Recent Labs    10/11/20 0521  HGBA1C 8.1*   CBG: Recent Labs  Lab 10/12/20 0744 10/12/20 1200 10/12/20 1659 10/12/20 2215 10/13/20 0741  GLUCAP 178* 182* 181* 179* 168*   Lipid Profile: Recent Labs    10/13/20 0453  CHOL 217*  HDL 41  LDLCALC 116*  TRIG 302*  CHOLHDL 5.3   Thyroid Function Tests: Recent Labs    10/10/20 2309 10/12/20 0832  TSH 5.182*  --   FREET4  --  1.13*   Anemia Panel: No results for input(s): VITAMINB12, FOLATE, FERRITIN, TIBC, IRON, RETICCTPCT in the last 72 hours. Sepsis Labs: No results for input(s): PROCALCITON, LATICACIDVEN in the last 168 hours.  Recent Results (from the past 240 hour(s))  Resp Panel by RT-PCR (Flu A&B, Covid) Nasopharyngeal Swab     Status: None   Collection Time: 10/10/20  9:04 PM   Specimen: Nasopharyngeal Swab; Nasopharyngeal(NP)  swabs in vial transport medium  Result Value Ref Range Status   SARS Coronavirus 2 by RT PCR NEGATIVE NEGATIVE Final    Comment: (NOTE) SARS-CoV-2 target nucleic acids are NOT DETECTED.  The SARS-CoV-2 RNA is generally detectable in upper respiratory specimens during the acute phase of infection. The lowest concentration of SARS-CoV-2 viral copies this assay can detect is 138 copies/mL. A negative result does not preclude SARS-Cov-2 infection and should not be used as the sole basis for treatment or other patient management decisions. A negative result may occur with  improper specimen collection/handling, submission of specimen other than nasopharyngeal swab, presence of viral mutation(s) within the areas targeted by this assay, and inadequate number of viral copies(<138 copies/mL). A negative result must be combined with clinical observations, patient history, and epidemiological information. The expected result is Negative.  Fact Sheet for Patients:  EntrepreneurPulse.com.au  Fact Sheet  for Healthcare Providers:  IncredibleEmployment.be  This test is no t yet approved or cleared by the [place] FDA and  has been authorized for detection and/or diagnosis of SARS-CoV-2 by FDA under an Emergency Use Authorization (EUA). This EUA will remain  in effect (meaning this test can be used) for the duration of the COVID-19 declaration under Section 564(b)(1) of the Act, 21 U.S.C.section 360bbb-3(b)(1), unless the authorization is terminated  or revoked sooner.       Influenza A by PCR NEGATIVE NEGATIVE Final   Influenza B by PCR NEGATIVE NEGATIVE Final    Comment: (NOTE) The Xpert Xpress SARS-CoV-2/FLU/RSV plus assay is intended as an aid in the diagnosis of influenza from Nasopharyngeal swab specimens and should not be used as a sole basis for treatment. Nasal washings and aspirates are unacceptable for Xpert Xpress SARS-CoV-2/FLU/RSV testing.  Fact Sheet for Patients: EntrepreneurPulse.com.au  Fact Sheet for Healthcare Providers: IncredibleEmployment.be  This test is not yet approved or cleared by the [place] FDA and has been authorized for detection and/or diagnosis of SARS-CoV-2 by FDA under an Emergency Use Authorization (EUA). This EUA will remain in effect (meaning this test can be used) for the duration of the COVID-19 declaration under Section 564(b)(1) of the Act, 21 U.S.C. section 360bbb-3(b)(1), unless the authorization is terminated or revoked.  Performed at [facility], [place] [address] [place], [place] [address]          Radiology Studies: US Abdomen Limited  Result Date: 10/11/2020 CLINICAL DATA:  Possible ascites EXAM: LIMITED ABDOMEN ULTRASOUND FOR ASCITES TECHNIQUE: Limited ultrasound survey for ascites was performed in all four abdominal quadrants. COMPARISON:  None. FINDINGS: No evidence of ascites. IMPRESSION: No ascites is identified. Electronically Signed    By: Inez Catalina M.D.   On: 10/11/2020 12:18   ECHOCARDIOGRAM COMPLETE  Result Date: 10/11/2020    ECHOCARDIOGRAM REPORT   Patient Name:   Charles [name] Date of Exam: 10/11/2020 Medical Rec #:  [id number]   Height:       69.0 in Accession #:    [id number]  Weight:       281.3 lb Date of Birth:  [birth year]    BSA:          2.388 m Patient Age:    70 years    BP:           146/92 mmHg Patient Gender: M           HR:           120 bpm. Exam Location:  Inpatient Procedure: 2D Echo  Results reported to Dr Tawanna Solo. Indications:    CHF 428  History:        Patient has no prior history of Echocardiogram examinations.                 Risk Factors:Diabetes, Hypertension and Current Smoker.  Sonographer:    Jannett Celestine RDCS (AE) Referring Phys: [phone] JARED M GARDNER  Sonographer Comments: [name] morbidly obese. Image acquisition challenging due to patient body habitus. patient had difficulty remaining awake during exam IMPRESSIONS  1. Left ventricular ejection fraction, by estimation, is 20 to 25%. The left ventricle has severely decreased function. The left ventricle demonstrates global hypokinesis. The left ventricular internal cavity size was severely dilated. Left ventricular diastolic parameters are indeterminate.  2. Right ventricular systolic function was not well visualized. The right ventricular size is not well visualized.  3. The mitral valve is normal in structure. No evidence of mitral valve regurgitation.  4. The aortic valve was not well visualized. Aortic valve regurgitation is not visualized. No aortic stenosis is present. FINDINGS  Left Ventricle: Left ventricular ejection fraction, by estimation, is 20 to 25%. The left ventricle has severely decreased function. The left ventricle demonstrates global hypokinesis. Definity contrast agent was given IV to delineate the left ventricular endocardial borders. The left ventricular internal cavity size was severely dilated. There is no left ventricular  hypertrophy. Left ventricular diastolic parameters are indeterminate. Right Ventricle: The right ventricular size is not well visualized. Right vetricular wall thickness was not assessed. Right ventricular systolic function was not well visualized. Left Atrium: Left atrial size was not well visualized. Right Atrium: Right atrial size was not well visualized. Pericardium: There is no evidence of pericardial effusion. Mitral Valve: The mitral valve is normal in structure. No evidence of mitral valve regurgitation. Tricuspid Valve: The tricuspid valve is normal in structure. Tricuspid valve regurgitation is trivial. Aortic Valve: The aortic valve was not well visualized. Aortic valve regurgitation is not visualized. No aortic stenosis is present. Pulmonic Valve: The pulmonic valve was not assessed. Pulmonic valve regurgitation is not visualized. Aorta: The aortic root is normal in size and structure. IAS/Shunts: The interatrial septum was not well visualized.  LEFT VENTRICLE PLAX 2D LVIDd:         7.00 cm LVIDs:         6.05 cm LV PW:         0.90 cm LV IVS:        0.90 cm LVOT diam:     1.90 cm LV SV:         25 LV SV Index:   10 LVOT Area:     2.84 cm  LEFT ATRIUM         Index LA diam:    3.30 cm 1.38 cm/m  AORTIC VALVE LVOT Vmax:   68.50 cm/s LVOT Vmean:  46.200 cm/s LVOT VTI:    0.087 m  AORTA Ao Root diam: 3.20 cm MITRAL VALVE MV Area (PHT): 6.54 cm    SHUNTS MV Decel Time: 116 msec    Systemic VTI:  0.09 m MV E velocity: 73.70 cm/s  Systemic Diam: 1.90 cm Oswaldo Milian MD Electronically signed by Oswaldo Milian MD Signature Date/Time: 10/11/2020/4:44:36 PM    Final         Scheduled Meds: . atorvastatin  10 mg Oral Daily  . brimonidine  1 drop Both Eyes BID  . carvedilol  6.25 mg Oral BID WC  . cyclobenzaprine  10 mg Oral  TID  . enoxaparin (LOVENOX) injection  60 mg Subcutaneous Q24H  . feeding supplement  237 mL Oral BID BM  . furosemide  60 mg Intravenous Q12H  . insulin aspart   0-15 Units Subcutaneous TID WC  . latanoprost  1 drop Both Eyes QHS  . levothyroxine  100 mcg Oral Q0600  . losartan  25 mg Oral Daily  . melatonin  3 mg Oral QHS  . polyethylene glycol  17 g Oral Daily  . risperiDONE  0.5 mg Oral QHS  . tamsulosin  0.4 mg Oral Daily   Continuous Infusions:   LOS: 2 days    Time spent: 25 mins.More than 50% of that time was spent in counseling and/or coordination of care.      Shelly Coss, MD [facility] P12/04/2020, 7:54 AM

## 2020-10-13 NOTE — Progress Notes (Signed)
Progress Note  Patient Name: Charles [name] Date of Encounter: 10/13/2020  [facility] Cardiologist: [name], MD   Subjective   Describes epigastric pain with coughing but no other chest pain; dyspnea improving.  Inpatient Medications    Scheduled Meds: . atorvastatin  10 mg Oral Daily  . brimonidine  1 drop Both Eyes BID  . carvedilol  6.25 mg Oral BID WC  . cyclobenzaprine  10 mg Oral TID  . enoxaparin (LOVENOX) injection  60 mg Subcutaneous Q24H  . feeding supplement  237 mL Oral BID BM  . furosemide  60 mg Intravenous Q12H  . insulin aspart  0-15 Units Subcutaneous TID WC  . latanoprost  1 drop Both Eyes QHS  . levothyroxine  100 mcg Oral Q0600  . losartan  25 mg Oral Daily  . melatonin  3 mg Oral QHS  . polyethylene glycol  17 g Oral Daily  . risperiDONE  0.5 mg Oral QHS  . tamsulosin  0.4 mg Oral Daily   Continuous Infusions:  PRN Meds: acetaminophen **OR** acetaminophen, guaiFENesin-codeine, ondansetron **OR** ondansetron (ZOFRAN) IV   Vital Signs    Vitals:   10/12/20 1255 10/12/20 1700 10/12/20 2127 10/13/20 0511  BP: 133/85  126/85 109/85  Pulse: (!) 119  (!) 115 (!) 114  Resp: 16  16 15   Temp: 97.7 F (36.5 C)  98.6 F (37 C) 97.9 F (36.6 C)  TempSrc: Oral  Oral Oral  SpO2: 96%  95% 93%  Weight:  123.6 kg  123.9 kg  Height:        Intake/Output Summary (Last 24 hours) at 10/13/2020 0941 Last data filed at 10/13/2020 0200 Gross per 24 hour  Intake 1077 ml  Output 1800 ml  Net -723 ml   Last 3 Weights 10/13/2020 10/12/2020 10/10/2020  Weight (lbs) 273 lb 2.4 oz 272 lb 6.4 oz 281 lb 4.8 oz  Weight (kg) 123.9 kg 123.56 kg 127.597 kg      Telemetry    Sinus Tach- Personally Reviewed   Physical Exam   GEN: No acute distress.   Neck: No JVD Cardiac: RRR, Positive gallop Respiratory: Clear to auscultation bilaterally. GI: Soft, nontender, non-distended  MS: Trace edema Neuro:  Nonfocal  Psych: Normal affect   Labs     High Sensitivity Troponin:   Recent Labs  Lab 10/11/20 0254 10/11/20 0521  TROPONINIHS 28* 25*      Chemistry Recent Labs  Lab 10/11/20 0839 10/12/20 0832 10/13/20 0453  NA 135 134* 136  K 3.9 4.0 3.9  CL 97* 98 96*  CO2 27 25 25   GLUCOSE 154* 156* 170*  BUN 17 20 22*  CREATININE 1.16 1.24 1.21  CALCIUM 9.5 9.3 9.2  GFRNONAA >60 >60 >60  ANIONGAP 11 11 15      Hematology Recent Labs  Lab 10/11/20 0839 10/12/20 0500 10/13/20 0453  WBC 10.4 10.0 9.8  RBC 6.32* 6.62* 6.46*  HGB 16.2 16.5 16.1  HCT 49.1 50.7 49.9  MCV 77.7* 76.6* 77.2*  MCH 25.6* 24.9* 24.9*  MCHC 33.0 32.5 32.3  RDW 19.5* 19.7* 19.6*  PLT 213 156 204    BNP Recent Labs  Lab 10/10/20 2102  BNP 97.4     Radiology    US Abdomen Limited  Result Date: 10/11/2020 CLINICAL DATA:  Possible ascites EXAM: LIMITED ABDOMEN ULTRASOUND FOR ASCITES TECHNIQUE: Limited ultrasound survey for ascites was performed in all four abdominal quadrants. COMPARISON:  None. FINDINGS: No evidence of ascites. IMPRESSION: No ascites  is identified. Electronically Signed   By: Inez Catalina M.D.   On: 10/11/2020 12:18   ECHOCARDIOGRAM COMPLETE  Result Date: 10/11/2020    ECHOCARDIOGRAM REPORT   Patient Name:   Charles [name] Date of Exam: 10/11/2020 Medical Rec #:  [id number]   Height:       69.0 in Accession #:    [id number]  Weight:       281.3 lb Date of Birth:  Nov 16, 1965    BSA:          2.388 m Patient Age:    54 years    BP:           146/92 mmHg Patient Gender: M           HR:           120 bpm. Exam Location:  Inpatient Procedure: 2D Echo  Results reported to Dr Tawanna Solo. Indications:    CHF 428  History:        Patient has no prior history of Echocardiogram examinations.                 Risk Factors:Diabetes, Hypertension and Current Smoker.  Sonographer:    Jannett Celestine RDCS (AE) Referring Phys: [phone] JARED M GARDNER  Sonographer Comments: [name] morbidly obese. Image acquisition challenging due to patient body  habitus. patient had difficulty remaining awake during exam IMPRESSIONS  1. Left ventricular ejection fraction, by estimation, is 20 to 25%. The left ventricle has severely decreased function. The left ventricle demonstrates global hypokinesis. The left ventricular internal cavity size was severely dilated. Left ventricular diastolic parameters are indeterminate.  2. Right ventricular systolic function was not well visualized. The right ventricular size is not well visualized.  3. The mitral valve is normal in structure. No evidence of mitral valve regurgitation.  4. The aortic valve was not well visualized. Aortic valve regurgitation is not visualized. No aortic stenosis is present. FINDINGS  Left Ventricle: Left ventricular ejection fraction, by estimation, is 20 to 25%. The left ventricle has severely decreased function. The left ventricle demonstrates global hypokinesis. Definity contrast agent was given IV to delineate the left ventricular endocardial borders. The left ventricular internal cavity size was severely dilated. There is no left ventricular hypertrophy. Left ventricular diastolic parameters are indeterminate. Right Ventricle: The right ventricular size is not well visualized. Right vetricular wall thickness was not assessed. Right ventricular systolic function was not well visualized. Left Atrium: Left atrial size was not well visualized. Right Atrium: Right atrial size was not well visualized. Pericardium: There is no evidence of pericardial effusion. Mitral Valve: The mitral valve is normal in structure. No evidence of mitral valve regurgitation. Tricuspid Valve: The tricuspid valve is normal in structure. Tricuspid valve regurgitation is trivial. Aortic Valve: The aortic valve was not well visualized. Aortic valve regurgitation is not visualized. No aortic stenosis is present. Pulmonic Valve: The pulmonic valve was not assessed. Pulmonic valve regurgitation is not visualized. Aorta: The aortic  root is normal in size and structure. IAS/Shunts: The interatrial septum was not well visualized.  LEFT VENTRICLE PLAX 2D LVIDd:         7.00 cm LVIDs:         6.05 cm LV PW:         0.90 cm LV IVS:        0.90 cm LVOT diam:     1.90 cm LV SV:         25 LV SV Index:  10 LVOT Area:     2.84 cm  LEFT ATRIUM         Index LA diam:    3.30 cm 1.38 cm/m  AORTIC VALVE LVOT Vmax:   68.50 cm/s LVOT Vmean:  46.200 cm/s LVOT VTI:    0.087 m  AORTA Ao Root diam: 3.20 cm MITRAL VALVE MV Area (PHT): 6.54 cm    SHUNTS MV Decel Time: 116 msec    Systemic VTI:  0.09 m MV E velocity: 73.70 cm/s  Systemic Diam: 1.90 cm Oswaldo Milian MD Electronically signed by Oswaldo Milian MD Signature Date/Time: 10/11/2020/4:44:36 PM    Final     Patient Profile     54 y.o. 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  Echocardiogram this admission shows ejection fraction 20 to 25%, severe left ventricular enlargement.  Assessment & Plan    1 acute systolic congestive heart failure-symptoms are improving but remains mildly volume overloaded.  Continue Lasix at present dose.  Weight decreased by 3.7 kg since admission.  Add spironolactone 12.5 mg daily.  Follow renal function closely.  2 cardiomyopathy-etiology unclear.  No history of alcohol abuse by patient report.  We will arrange right and left cardiac catheterization tomorrow to exclude coronary disease.  The risks and benefits including myocardial infarction, CVA and death discussed and he agrees to proceed.  We will continue losartan for now but transition to [facility] tomorrow (lisinopril was discontinued yesterday).  We will discontinue carvedilol for now in the setting of acute heart failure.  Resume later as tolerated.  Add digoxin 0.125 mg daily.  Will need  full titration of medications.  Repeat echocardiogram 3 months later to see if LV function has improved.  3 hypertension-blood pressure reasonably well controlled.  Titrate CHF medications as tolerated.  4 hyperlipidemia-continue statin.  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  Further evaluation once cardiac status improves.  For questions or updates, please contact [place] Please consult [email or web address] for contact info under        Signed, Kirk Ruths, MD  10/13/2020, 9:41 AM

## 2020-10-13 NOTE — Plan of Care (Signed)

## 2020-10-13 NOTE — H&P (View-Only) (Signed)
Progress Note  Patient Name: Charles [name] Date of Encounter: 10/13/2020  [facility] Cardiologist: [name], MD   Subjective   Describes epigastric pain with coughing but no other chest pain; dyspnea improving.  Inpatient Medications    Scheduled Meds: . atorvastatin  10 mg Oral Daily  . brimonidine  1 drop Both Eyes BID  . carvedilol  6.25 mg Oral BID WC  . cyclobenzaprine  10 mg Oral TID  . enoxaparin (LOVENOX) injection  60 mg Subcutaneous Q24H  . feeding supplement  237 mL Oral BID BM  . furosemide  60 mg Intravenous Q12H  . insulin aspart  0-15 Units Subcutaneous TID WC  . latanoprost  1 drop Both Eyes QHS  . levothyroxine  100 mcg Oral Q0600  . losartan  25 mg Oral Daily  . melatonin  3 mg Oral QHS  . polyethylene glycol  17 g Oral Daily  . risperiDONE  0.5 mg Oral QHS  . tamsulosin  0.4 mg Oral Daily   Continuous Infusions:  PRN Meds: acetaminophen **OR** acetaminophen, guaiFENesin-codeine, ondansetron **OR** ondansetron (ZOFRAN) IV   Vital Signs    Vitals:   10/12/20 1255 10/12/20 1700 10/12/20 2127 10/13/20 0511  BP: 133/85  126/85 109/85  Pulse: (!) 119  (!) 115 (!) 114  Resp: 16  16 15   Temp: 97.7 F (36.5 C)  98.6 F (37 C) 97.9 F (36.6 C)  TempSrc: Oral  Oral Oral  SpO2: 96%  95% 93%  Weight:  123.6 kg  123.9 kg  Height:        Intake/Output Summary (Last 24 hours) at 10/13/2020 0941 Last data filed at 10/13/2020 0200 Gross per 24 hour  Intake 1077 ml  Output 1800 ml  Net -723 ml   Last 3 Weights 10/13/2020 10/12/2020 10/10/2020  Weight (lbs) 273 lb 2.4 oz 272 lb 6.4 oz 281 lb 4.8 oz  Weight (kg) 123.9 kg 123.56 kg 127.597 kg      Telemetry    Sinus Tach- Personally Reviewed   Physical Exam   GEN: No acute distress.   Neck: No JVD Cardiac: RRR, Positive gallop Respiratory: Clear to auscultation bilaterally. GI: Soft, nontender, non-distended  MS: Trace edema Neuro:  Nonfocal  Psych: Normal affect   Labs     High Sensitivity Troponin:   Recent Labs  Lab 10/11/20 0254 10/11/20 0521  TROPONINIHS 28* 25*      Chemistry Recent Labs  Lab 10/11/20 0839 10/12/20 0832 10/13/20 0453  NA 135 134* 136  K 3.9 4.0 3.9  CL 97* 98 96*  CO2 27 25 25   GLUCOSE 154* 156* 170*  BUN 17 20 22*  CREATININE 1.16 1.24 1.21  CALCIUM 9.5 9.3 9.2  GFRNONAA >60 >60 >60  ANIONGAP 11 11 15      Hematology Recent Labs  Lab 10/11/20 0839 10/12/20 0500 10/13/20 0453  WBC 10.4 10.0 9.8  RBC 6.32* 6.62* 6.46*  HGB 16.2 16.5 16.1  HCT 49.1 50.7 49.9  MCV 77.7* 76.6* 77.2*  MCH 25.6* 24.9* 24.9*  MCHC 33.0 32.5 32.3  RDW 19.5* 19.7* 19.6*  PLT 213 156 204    BNP Recent Labs  Lab 10/10/20 2102  BNP 97.4     Radiology    US Abdomen Limited  Result Date: 10/11/2020 CLINICAL DATA:  Possible ascites EXAM: LIMITED ABDOMEN ULTRASOUND FOR ASCITES TECHNIQUE: Limited ultrasound survey for ascites was performed in all four abdominal quadrants. COMPARISON:  None. FINDINGS: No evidence of ascites. IMPRESSION: No ascites  is identified. Electronically Signed   By: Inez Catalina M.D.   On: 10/11/2020 12:18   ECHOCARDIOGRAM COMPLETE  Result Date: 10/11/2020    ECHOCARDIOGRAM REPORT   Patient Name:   Charles [name] Date of Exam: 10/11/2020 Medical Rec #:  [id number]   Height:       69.0 in Accession #:    [id number]  Weight:       281.3 lb Date of Birth:  Sep 10, 1966    BSA:          2.388 m Patient Age:    54 years    BP:           146/92 mmHg Patient Gender: M           HR:           120 bpm. Exam Location:  Inpatient Procedure: 2D Echo  Results reported to Dr Tawanna Solo. Indications:    CHF 428  History:        Patient has no prior history of Echocardiogram examinations.                 Risk Factors:Diabetes, Hypertension and Current Smoker.  Sonographer:    Jannett Celestine RDCS (AE) Referring Phys: [phone] JARED M GARDNER  Sonographer Comments: [name] morbidly obese. Image acquisition challenging due to patient body  habitus. patient had difficulty remaining awake during exam IMPRESSIONS  1. Left ventricular ejection fraction, by estimation, is 20 to 25%. The left ventricle has severely decreased function. The left ventricle demonstrates global hypokinesis. The left ventricular internal cavity size was severely dilated. Left ventricular diastolic parameters are indeterminate.  2. Right ventricular systolic function was not well visualized. The right ventricular size is not well visualized.  3. The mitral valve is normal in structure. No evidence of mitral valve regurgitation.  4. The aortic valve was not well visualized. Aortic valve regurgitation is not visualized. No aortic stenosis is present. FINDINGS  Left Ventricle: Left ventricular ejection fraction, by estimation, is 20 to 25%. The left ventricle has severely decreased function. The left ventricle demonstrates global hypokinesis. Definity contrast agent was given IV to delineate the left ventricular endocardial borders. The left ventricular internal cavity size was severely dilated. There is no left ventricular hypertrophy. Left ventricular diastolic parameters are indeterminate. Right Ventricle: The right ventricular size is not well visualized. Right vetricular wall thickness was not assessed. Right ventricular systolic function was not well visualized. Left Atrium: Left atrial size was not well visualized. Right Atrium: Right atrial size was not well visualized. Pericardium: There is no evidence of pericardial effusion. Mitral Valve: The mitral valve is normal in structure. No evidence of mitral valve regurgitation. Tricuspid Valve: The tricuspid valve is normal in structure. Tricuspid valve regurgitation is trivial. Aortic Valve: The aortic valve was not well visualized. Aortic valve regurgitation is not visualized. No aortic stenosis is present. Pulmonic Valve: The pulmonic valve was not assessed. Pulmonic valve regurgitation is not visualized. Aorta: The aortic  root is normal in size and structure. IAS/Shunts: The interatrial septum was not well visualized.  LEFT VENTRICLE PLAX 2D LVIDd:         7.00 cm LVIDs:         6.05 cm LV PW:         0.90 cm LV IVS:        0.90 cm LVOT diam:     1.90 cm LV SV:         25 LV SV Index:  10 LVOT Area:     2.84 cm  LEFT ATRIUM         Index LA diam:    3.30 cm 1.38 cm/m  AORTIC VALVE LVOT Vmax:   68.50 cm/s LVOT Vmean:  46.200 cm/s LVOT VTI:    0.087 m  AORTA Ao Root diam: 3.20 cm MITRAL VALVE MV Area (PHT): 6.54 cm    SHUNTS MV Decel Time: 116 msec    Systemic VTI:  0.09 m MV E velocity: 73.70 cm/s  Systemic Diam: 1.90 cm Oswaldo Milian MD Electronically signed by Oswaldo Milian MD Signature Date/Time: 10/11/2020/4:44:36 PM    Final     Patient Profile     54 y.o. 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  Echocardiogram this admission shows ejection fraction 20 to 25%, severe left ventricular enlargement.  Assessment & Plan    1 acute systolic congestive heart failure-symptoms are improving but remains mildly volume overloaded.  Continue Lasix at present dose.  Weight decreased by 3.7 kg since admission.  Add spironolactone 12.5 mg daily.  Follow renal function closely.  2 cardiomyopathy-etiology unclear.  No history of alcohol abuse by patient report.  We will arrange right and left cardiac catheterization tomorrow to exclude coronary disease.  The risks and benefits including myocardial infarction, CVA and death discussed and he agrees to proceed.  We will continue losartan for now but transition to [facility] tomorrow (lisinopril was discontinued yesterday).  We will discontinue carvedilol for now in the setting of acute heart failure.  Resume later as tolerated.  Add digoxin 0.125 mg daily.  Will need  full titration of medications.  Repeat echocardiogram 3 months later to see if LV function has improved.  3 hypertension-blood pressure reasonably well controlled.  Titrate CHF medications as tolerated.  4 hyperlipidemia-continue statin.  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  Further evaluation once cardiac status improves.  For questions or updates, please contact [place] Please consult [email or web address] for contact info under        Signed, Kirk Ruths, MD  10/13/2020, 9:41 AM

## 2020-10-13 NOTE — Plan of Care (Signed)
  Problem: Fluid Volume: Goal: Ability to maintain a balanced intake and output will improve Outcome: Progressing   Problem: Clinical Measurements: Goal: Respiratory complications will improve Outcome: Progressing Goal: Cardiovascular complication will be avoided Outcome: Progressing   Problem: Activity: Goal: Risk for activity intolerance will decrease Outcome: Progressing   Problem: Nutrition: Goal: Adequate nutrition will be maintained Outcome: Progressing   Problem: Coping: Goal: Level of anxiety will decrease Outcome: Progressing   Problem: Elimination: Goal: Will not experience complications related to bowel motility Outcome: Progressing Goal: Will not experience complications related to urinary retention Outcome: Progressing

## 2020-10-14 ENCOUNTER — Encounter (HOSPITAL_COMMUNITY)
Admission: EM | Disposition: A | Discharge status: Nursing Home (Includes ICF) | Payer: Self-pay | Source: Skilled Nursing Facility | Attending: Internal Medicine

## 2020-10-14 ENCOUNTER — Encounter (HOSPITAL_COMMUNITY): Payer: Self-pay | Admitting: Interventional Cardiology

## 2020-10-14 DIAGNOSIS — I5021 Acute systolic (congestive) heart failure: Secondary | ICD-10-CM

## 2020-10-14 DIAGNOSIS — I428 Other cardiomyopathies: Secondary | ICD-10-CM | POA: Diagnosis not present

## 2020-10-14 DIAGNOSIS — I5023 Acute on chronic systolic (congestive) heart failure: Secondary | ICD-10-CM

## 2020-10-14 DIAGNOSIS — I2510 Atherosclerotic heart disease of native coronary artery without angina pectoris: Secondary | ICD-10-CM

## 2020-10-14 HISTORY — PX: RIGHT/LEFT HEART CATH AND CORONARY ANGIOGRAPHY: CATH118266

## 2020-10-14 LAB — POCT I-STAT EG7
Acid-Base Excess: 4 mmol/L — ABNORMAL HIGH (ref 0.0–2.0)
Bicarbonate: 31.9 mmol/L — ABNORMAL HIGH (ref 20.0–28.0)
Calcium, Ion: 1.22 mmol/L (ref 1.15–1.40)
HCT: 54 % — ABNORMAL HIGH (ref 39.0–52.0)
Hemoglobin: 18.4 g/dL — ABNORMAL HIGH (ref 13.0–17.0)
O2 Saturation: 69 %
Potassium: 4 mmol/L (ref 3.5–5.1)
Sodium: 136 mmol/L (ref 135–145)
TCO2: 34 mmol/L — ABNORMAL HIGH (ref 22–32)
pCO2, Ven: 56.4 mmHg (ref 44.0–60.0)
pH, Ven: 7.361 (ref 7.250–7.430)
pO2, Ven: 39 mmHg (ref 32.0–45.0)

## 2020-10-14 LAB — GLUCOSE, CAPILLARY
Glucose-Capillary: 160 mg/dL — ABNORMAL HIGH (ref 70–99)
Glucose-Capillary: 67 mg/dL — ABNORMAL LOW (ref 70–99)
Glucose-Capillary: 214 mg/dL — ABNORMAL HIGH (ref 70–99)
Glucose-Capillary: 251 mg/dL — ABNORMAL HIGH (ref 70–99)
Glucose-Capillary: 236 mg/dL — ABNORMAL HIGH (ref 70–99)

## 2020-10-14 LAB — POCT I-STAT 7, (LYTES, BLD GAS, ICA,H+H)
Acid-Base Excess: 4 mmol/L — ABNORMAL HIGH (ref 0.0–2.0)
Bicarbonate: 29.5 mmol/L — ABNORMAL HIGH (ref 20.0–28.0)
Calcium, Ion: 1.19 mmol/L (ref 1.15–1.40)
HCT: 53 % — ABNORMAL HIGH (ref 39.0–52.0)
Hemoglobin: 18 g/dL — ABNORMAL HIGH (ref 13.0–17.0)
O2 Saturation: 98 %
Potassium: 3.8 mmol/L (ref 3.5–5.1)
Sodium: 135 mmol/L (ref 135–145)
TCO2: 31 mmol/L (ref 22–32)
pCO2 arterial: 47.6 mmHg (ref 32.0–48.0)
pH, Arterial: 7.401 (ref 7.350–7.450)
pO2, Arterial: 113 mmHg — ABNORMAL HIGH (ref 83.0–108.0)

## 2020-10-14 LAB — BASIC METABOLIC PANEL
Anion gap: 13 (ref 5–15)
BUN: 23 mg/dL — ABNORMAL HIGH (ref 6–20)
CO2: 24 mmol/L (ref 22–32)
Calcium: 9.2 mg/dL (ref 8.9–10.3)
Chloride: 96 mmol/L — ABNORMAL LOW (ref 98–111)
Creatinine, Ser: 1.14 mg/dL (ref 0.61–1.24)
GFR, Estimated: >60 mL/min (ref >60)
Glucose, Bld: 164 mg/dL — ABNORMAL HIGH (ref 70–99)
Potassium: 4 mmol/L (ref 3.5–5.1)
Sodium: 133 mmol/L — ABNORMAL LOW (ref 135–145)

## 2020-10-14 LAB — POCT ACTIVATED CLOTTING TIME: Activated Clotting Time: 249 seconds

## 2020-10-14 SURGERY — RIGHT/LEFT HEART CATH AND CORONARY ANGIOGRAPHY
Anesthesia: LOCAL

## 2020-10-14 MED ORDER — FUROSEMIDE 10 MG/ML IJ SOLN
INTRAMUSCULAR | Status: AC
  Filled 2020-10-14: qty 8

## 2020-10-14 MED ORDER — HEPARIN SODIUM (PORCINE) 1000 UNIT/ML IJ SOLN
INTRAMUSCULAR | Status: DC | PRN
  Administered 2020-10-14: 6000 [IU] via INTRAVENOUS

## 2020-10-14 MED ORDER — MIDAZOLAM HCL 2 MG/2ML IJ SOLN
INTRAMUSCULAR | Status: AC
  Filled 2020-10-14: qty 2

## 2020-10-14 MED ORDER — SACUBITRIL-VALSARTAN 24-26 MG PO TABS
1.0000 | ORAL_TABLET | Freq: Two times a day (BID) | ORAL | Status: DC
  Administered 2020-10-14 – 2020-10-23 (×19): 1 via ORAL
  Filled 2020-10-14 (×19): qty 1

## 2020-10-14 MED ORDER — FENTANYL CITRATE (PF) 100 MCG/2ML IJ SOLN
INTRAMUSCULAR | Status: AC
  Filled 2020-10-14: qty 2

## 2020-10-14 MED ORDER — HEPARIN (PORCINE) IN NACL 1000-0.9 UT/500ML-% IV SOLN
INTRAVENOUS | Status: DC | PRN
  Administered 2020-10-14 (×2): 500 mL

## 2020-10-14 MED ORDER — TICAGRELOR 90 MG PO TABS
ORAL_TABLET | ORAL | Status: AC
  Filled 2020-10-14: qty 2

## 2020-10-14 MED ORDER — SODIUM CHLORIDE 0.9 % IV SOLN
INTRAVENOUS | Status: AC | PRN
  Administered 2020-10-14: 10 mL/h via INTRAVENOUS

## 2020-10-14 MED ORDER — ONDANSETRON HCL 4 MG/2ML IJ SOLN: 4.0000 mg | Freq: Four times a day (QID) | INTRAMUSCULAR | Status: DC | PRN

## 2020-10-14 MED ORDER — HYDRALAZINE HCL 20 MG/ML IJ SOLN: 10.0000 mg | INTRAMUSCULAR | Status: AC | PRN

## 2020-10-14 MED ORDER — SODIUM CHLORIDE 0.9 % IV SOLN: 250.0000 mL | INTRAVENOUS | Status: DC | PRN

## 2020-10-14 MED ORDER — HEPARIN SODIUM (PORCINE) 1000 UNIT/ML IJ SOLN
INTRAMUSCULAR | Status: AC
  Filled 2020-10-14: qty 1

## 2020-10-14 MED ORDER — FENTANYL CITRATE (PF) 100 MCG/2ML IJ SOLN
INTRAMUSCULAR | Status: DC | PRN
  Administered 2020-10-14: 25 ug via INTRAVENOUS

## 2020-10-14 MED ORDER — IOHEXOL 350 MG/ML SOLN
INTRAVENOUS | Status: DC | PRN
  Administered 2020-10-14: 35 mL via INTRA_ARTERIAL

## 2020-10-14 MED ORDER — LIDOCAINE HCL (PF) 1 % IJ SOLN
INTRAMUSCULAR | Status: DC | PRN
  Administered 2020-10-14: 10 mL
  Administered 2020-10-14 (×3): 2 mL

## 2020-10-14 MED ORDER — VERAPAMIL HCL 2.5 MG/ML IV SOLN
INTRAVENOUS | Status: AC
  Filled 2020-10-14: qty 2

## 2020-10-14 MED ORDER — HEPARIN (PORCINE) IN NACL 1000-0.9 UT/500ML-% IV SOLN
INTRAVENOUS | Status: AC
  Filled 2020-10-14: qty 1000

## 2020-10-14 MED ORDER — LIDOCAINE HCL (PF) 1 % IJ SOLN
INTRAMUSCULAR | Status: AC
  Filled 2020-10-14: qty 30

## 2020-10-14 MED ORDER — LABETALOL HCL 5 MG/ML IV SOLN
INTRAVENOUS | Status: AC
  Filled 2020-10-14: qty 4

## 2020-10-14 MED ORDER — VERAPAMIL HCL 2.5 MG/ML IV SOLN
INTRAVENOUS | Status: DC | PRN
  Administered 2020-10-14: 10 mL via INTRA_ARTERIAL

## 2020-10-14 MED ORDER — SODIUM CHLORIDE 0.9% FLUSH
3.0000 mL | Freq: Two times a day (BID) | INTRAVENOUS | Status: DC
  Administered 2020-10-14 – 2020-10-20 (×10): 3 mL via INTRAVENOUS

## 2020-10-14 MED ORDER — SODIUM CHLORIDE 0.9% FLUSH
3.0000 mL | INTRAVENOUS | Status: DC | PRN
  Administered 2020-10-20 (×2): 3 mL via INTRAVENOUS

## 2020-10-14 MED ORDER — MAGNESIUM HYDROXIDE 400 MG/5ML PO SUSP
30.0000 mL | Freq: Every day | ORAL | Status: DC | PRN
  Administered 2020-10-14 – 2020-10-15 (×2): 30 mL via ORAL
  Filled 2020-10-14 (×2): qty 30

## 2020-10-14 MED ORDER — ACETAMINOPHEN 325 MG PO TABS: 650.0000 mg | ORAL_TABLET | ORAL | Status: DC | PRN

## 2020-10-14 MED ORDER — MIDAZOLAM HCL 2 MG/2ML IJ SOLN
INTRAMUSCULAR | Status: DC | PRN
  Administered 2020-10-14: 2 mg via INTRAVENOUS

## 2020-10-14 MED ORDER — LABETALOL HCL 5 MG/ML IV SOLN
10.0000 mg | INTRAVENOUS | Status: AC | PRN
  Administered 2020-10-14: 10 mg via INTRAVENOUS

## 2020-10-14 SURGICAL SUPPLY — 15 items
CATH 5FR JL3.5 JR4 ANG PIG MP (CATHETERS) ×1 IMPLANT
CATH SWAN GANZ 7F STRAIGHT (CATHETERS) ×1 IMPLANT
DEVICE RAD COMP TR BAND LRG (VASCULAR PRODUCTS) ×1 IMPLANT
GLIDESHEATH SLEND SS 6F .021 (SHEATH) ×1 IMPLANT
GUIDEWIRE INQWIRE 1.5J.035X260 (WIRE) IMPLANT
INQWIRE 1.5J .035X260CM (WIRE) ×2
KIT HEART LEFT (KITS) ×2 IMPLANT
MAT PREVALON FULL STRYKER (MISCELLANEOUS) ×1 IMPLANT
PACK CARDIAC CATHETERIZATION (CUSTOM PROCEDURE TRAY) ×2 IMPLANT
SHEATH GLIDE SLENDER 4/5FR (SHEATH) ×2 IMPLANT
SHEATH PINNACLE 7F 10CM (SHEATH) ×1 IMPLANT
SHEATH PROBE COVER 6X72 (BAG) ×1 IMPLANT
TRANSDUCER W/STOPCOCK (MISCELLANEOUS) ×2 IMPLANT
TUBING CIL FLEX 10 FLL-RA (TUBING) ×2 IMPLANT
WIRE MICROINTRODUCER 60CM (WIRE) ×1 IMPLANT

## 2020-10-14 NOTE — Progress Notes (Signed)
Paged MD on call related elevated heart rate in the 140's. Awaiting call back.

## 2020-10-14 NOTE — Progress Notes (Signed)
CBG 67. Pt given orange juice 4oz until pt can eat and venous sheath is removed.

## 2020-10-14 NOTE — Progress Notes (Signed)
Site area: Rt fem vein Site Prior to Removal:  Level 0 Pressure Applied For: 15 min Manual:   yes Patient Status During Pull: Responds to voicePost Pull Site:  Level Post Pull Instructions Given: Post instructions given and pt understands  Post Pull Pulses Present:  Venous  Dressing Applied:  tegaderm and a 4x4 Bedrest begins @ 12:00:00 Comments:

## 2020-10-14 NOTE — Progress Notes (Addendum)
Progress Note  Patient Name: JAVIUS SYLLA Date of Encounter: 10/14/2020  [facility] Cardiologist: [name], MD   Subjective   No dyspnea or CP  Inpatient Medications    Scheduled Meds: . [MAR Hold] atorvastatin  10 mg Oral Daily  . [MAR Hold] brimonidine  1 drop Both Eyes BID  . [MAR Hold] cyclobenzaprine  10 mg Oral TID  . [MAR Hold] digoxin  0.125 mg Oral Daily  . [MAR Hold] enoxaparin (LOVENOX) injection  60 mg Subcutaneous Q24H  . [MAR Hold] feeding supplement  237 mL Oral BID BM  . [MAR Hold] furosemide  60 mg Intravenous Q12H  . [MAR Hold] insulin aspart  0-15 Units Subcutaneous TID WC  . [MAR Hold] latanoprost  1 drop Both Eyes QHS  . [MAR Hold] levothyroxine  100 mcg Oral Q0600  . [MAR Hold] losartan  25 mg Oral Daily  . [MAR Hold] melatonin  3 mg Oral QHS  . [MAR Hold] polyethylene glycol  17 g Oral Daily  . [MAR Hold] risperiDONE  0.5 mg Oral QHS  . [MAR Hold] sodium chloride flush  3 mL Intravenous Q12H  . [MAR Hold] spironolactone  12.5 mg Oral Daily  . [MAR Hold] tamsulosin  0.4 mg Oral Daily   Continuous Infusions: . sodium chloride    . sodium chloride     PRN Meds: sodium chloride, [MAR Hold] acetaminophen **OR** [MAR Hold] acetaminophen, [MAR Hold] alum & mag hydroxide-simeth, [MAR Hold] guaiFENesin-codeine, labetalol, [MAR Hold] ondansetron **OR** [MAR Hold] ondansetron (ZOFRAN) IV, sodium chloride flush   Vital Signs    Vitals:   10/14/20 1115 10/14/20 1120 10/14/20 1125 10/14/20 1130  BP: (!) 109/93 (!) 126/100 (!) 133/93 (!) 138/99  Pulse: (!) 110 (!) 102 (!) 106 (!) 108  Resp:      Temp:      TempSrc:      SpO2: 97% 97% 98% 96%  Weight:      Height:        Intake/Output Summary (Last 24 hours) at 10/14/2020 1150 Last data filed at 10/14/2020 0900 Gross per 24 hour  Intake 200 ml  Output 2200 ml  Net -2000 ml   Last 3 Weights 10/13/2020 10/13/2020 10/12/2020  Weight (lbs) 275 lb 4 oz 273 lb 2.4 oz 272 lb 6.4 oz  Weight  (kg) 124.853 kg 123.9 kg 123.56 kg      Telemetry    Sinus Tach- Personally Reviewed   Physical Exam   GEN: No acute distress.  Obese Neck: Positive JVD Cardiac: RRR, Positive gallop, no murmur Respiratory: CTA GI: Soft, NT/ND MS: Trace edema Neuro:  Grossly intact Psych: Normal affect   Labs    High Sensitivity Troponin:   Recent Labs  Lab 10/11/20 0254 10/11/20 0521  TROPONINIHS 28* 25*      Chemistry Recent Labs  Lab 10/12/20 0832 10/13/20 0453 10/14/20 0445  NA 134* 136 133*  K 4.0 3.9 4.0  CL 98 96* 96*  CO2 25 25 24   GLUCOSE 156* 170* 164*  BUN 20 22* 23*  CREATININE 1.24 1.21 1.14  CALCIUM 9.3 9.2 9.2  GFRNONAA >60 >60 >60  ANIONGAP 11 15 13      Hematology Recent Labs  Lab 10/11/20 0839 10/12/20 0500 10/13/20 0453  WBC 10.4 10.0 9.8  RBC 6.32* 6.62* 6.46*  HGB 16.2 16.5 16.1  HCT 49.1 50.7 49.9  MCV 77.7* 76.6* 77.2*  MCH 25.6* 24.9* 24.9*  MCHC 33.0 32.5 32.3  RDW 19.5* 19.7* 19.6*  PLT 213 156 204    BNP Recent Labs  Lab 10/10/20 2102  BNP 97.4     Radiology    CARDIAC CATHETERIZATION  Result Date: 62/07/5283  LV end diastolic pressure is severely elevated. LVEDP 29 mm Hg.  There is no aortic valve stenosis.  No angiographically apparent CAD.  Ao sat 98%, PA sat 69%, PA pressure 34/22, mean 26 mm Hg; mean PCWP 26 mm Hg; CO 4.96 L/min; CI 2.09  Continue medical therapy for nonischemic cardiomyopathy.    Patient Profile     54 y.o. 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  Echocardiogram this admission shows ejection fraction 20 to 25%, severe left ventricular enlargement.  Assessment & Plan    1 acute systolic congestive heart failure-symptoms continue to improve.  However LVEDP 29 at time of catheterization today.   Continue Lasix at present dose.  Follow renal function.  Continue spironolactone.   2 nonischemic cardiomyopathy-etiology unclear.  Cardiac catheterization reveals no coronary disease.  No history of alcohol abuse by patient report.  I will discontinue losartan.  Instead we will begin Entresto 24/26 twice daily.  Titrate medications as tolerated.  We will add beta-blocker later once CHF improves.  Continue digoxin.  Plan to repeat echo cardiogram 3 months after medications fully titrated to see if LV function has improved.  If not we will need to consider ICD.   3 hypertension-blood pressure elevated.  Titrate CHF medications as tolerated.  4 hyperlipidemia-continue statin.  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 Further evaluation once cardiac status improves.  For questions or updates, please contact [place] Please consult [email or web address] for contact info under        Signed, Kirk Ruths, MD  10/14/2020, 11:50 AM

## 2020-10-14 NOTE — Progress Notes (Signed)
Patient sinus tachy since arrival from cath lab. Called hospitalist practice for coverage and requested they call back regarding this.

## 2020-10-14 NOTE — Progress Notes (Signed)
PROGRESS NOTE    Charles [name]  OIZ:[id number] DOB: June 03, 1966 DOA: 10/10/2020 PCP: Patient, No Pcp Per   Chief Complain: Shortness of breath  Brief Narrative: [name] a 54 year old male with history of hypertension, diabetes type 2, obesity who presented to the emergency department from ALF with complaints of shortness of breath.  He was recently suspected to have lymphoma due to finding of metastatic lymphadenopathy and was following with oncology and has been planned for biopsy by CT surgery. He follows with Dr. Lorenso Courier.On presentation he was tachycardic, hypoxic needing 2 L of oxygen per minute.  Chest x-ray showed possible pulmonary edema.  Also was endorsing shortness of breath.CT angiogram of the chest did not show any PE but showed worsening mediastinal lymphadenopathy. Echocardiogram showed severe reduction in ejection fraction of 20 to 25%,which is a new finding.  Currently being diuresed with IV Lasix.  Cardiology consulted and following.Plan for right and left heart catheterization today. Cardiology decided to keep the patient at [facility] after cath.  Assessment & Plan:   Principal Problem:   Shortness of breath Active Problems:   Hypertension   Mediastinal lymphadenopathy   Sinus tachycardia   Night sweats   DM2 (diabetes mellitus, type 2) (HCC)   Lymphoma (HCC)   Acute systolic heart failure (HCC)   Acute congestive heart failure with reduced ejection fraction: Presented with dyspnea.  Appeared volume overloaded on presentation but BNP was normal.  Echo done here showed ejection fraction of 20 to 25% was a new finding.  Cardiology consulted.  Continue IV Lasix because he still appears well overloaded.  Started on losartan with plan for transitioning to [facility] .  Also started on digoxin, spironolactone. Carvedilol has been  discontinued in the setting of acute heart failure.  Plan for right and left heart catheterization today.Found to have nonischemic cardiomyopathy with no  angiographically apparent coronary artery disease.   Acute hypoxic respiratory failure: Suspected to be from CHF exacerbation.  CT angiogram of the chest did not show any PE.  Currently on 2 L of oxygen per minute.  Mediastinal lymphadenopathy: Follows with oncology.  High suspicion for Hodgkin's lymphoma.  CT imaging showed progressive lymphadenopathy.  [name] following up with CT surgery soon for the biopsy of the lymph nodes.  We recommend to follow-up with CT surgery and oncology as an outpatient as soon as possible after discharge. Current focus is  on stabilizing his respiratory / volume status.  Hypertension: Currently well controlled.  Continue current medications.  Currently in  sinus tachycardia.  Diabetes type 2: On Metformin at home.  Continue sliding scale insulin.  Hemoglobin A1c of 8.1.  Hypothyroidism: Continue Synthyroid.  Mild elevated TSH level.Free t4 mildly elevated.  Results inconclusive. Repeat TFTs in 4 to 6 weeks.  BPH: Takes Flomax  Hyperlipidemia: Takes Lipitor at home.  Morbid obesity: BMI of 41.5          DVT prophylaxis: Lovenox Code Status: Full Family Communication: None at bedside Status is: Inpatient  Remains inpatient appropriate because:Inpatient level of care appropriate due to severity of illness   Dispo: The patient is from: Home( ALF)              Anticipated d/c is to: Home              Anticipated d/c date is: 1-2 days              Patient currently is not medically stable to d/c.     Consultants: Cardiology  Procedures: None  Antimicrobials:  Anti-infectives (From admission, onward)   None      Subjective:  Patient seen and examined at the bedside this afternoon. Underwent cardiac cath. Still on  sinus tachycardia.  Currently on room air during my evaluation, did not complain of any shortness of breath but complained of some stomach upset after having lunch  Objective: Vitals:   10/14/20 1305 10/14/20 1310 10/14/20  1315 10/14/20 1320  BP: 136/80 (!) 135/94 (!) 135/100 (!) 135/96  Pulse: (!) 110 (!) 110 (!) 113 (!) 114  Resp: (!) 26 (!) 28 (!) 44 (!) 27  Temp:      TempSrc:      SpO2: 97% 96% 98% 96%  Weight:      Height:        Intake/Output Summary (Last 24 hours) at 10/14/2020 1356 Last data filed at 10/14/2020 1200 Gross per 24 hour  Intake 200 ml  Output 2100 ml  Net -1900 ml   Filed Weights   10/12/20 1700 10/13/20 0511 10/13/20 1709  Weight: 123.6 kg 123.9 kg 124.9 kg    Examination:    General exam: Not in acute distress,morbidly obese Respiratory system: Bilateral mildly diminished air sounds but no wheezes or crackles Cardiovascular system:Sinus tachycardia,. No JVD, murmurs, rubs, gallops or clicks. Gastrointestinal system: Abdomen is distended, soft and nontender. No organomegaly or masses felt. Normal bowel sounds heard. Central nervous system: Alert and oriented. No focal neurological deficits. Extremities: No edema, no clubbing ,no cyanosis Skin: No rashes, lesions or ulcers,no icterus ,no pallor   Data Reviewed: I have personally reviewed following labs and imaging studies  CBC: Recent Labs  Lab 10/10/20 2102 10/11/20 0839 10/12/20 0500 10/13/20 0453  WBC 11.6* 10.4 10.0 9.8  NEUTROABS 6.6 6.5 5.8 5.1  HGB 15.1 16.2 16.5 16.1  HCT 46.7 49.1 50.7 49.9  MCV 77.4* 77.7* 76.6* 77.2*  PLT 204 213 156 315   Basic Metabolic Panel: Recent Labs  Lab 10/10/20 2211 10/11/20 0839 10/12/20 0832 10/13/20 0453 10/14/20 0445  NA 135 135 134* 136 133*  K 4.1 3.9 4.0 3.9 4.0  CL 99 97* 98 96* 96*  CO2 24 27 25 25 24   GLUCOSE 131* 154* 156* 170* 164*  BUN 16 17 20  22* 23*  CREATININE 1.08 1.16 1.24 1.21 1.14  CALCIUM 9.0 9.5 9.3 9.2 9.2   GFR: Estimated Creatinine Clearance: 94.6 mL/min (by C-G formula based on SCr of 1.14 mg/dL). Liver Function Tests: No results for input(s): AST, ALT, ALKPHOS, BILITOT, PROT, ALBUMIN in the last 168 hours. No results for  input(s): LIPASE, AMYLASE in the last 168 hours. No results for input(s): AMMONIA in the last 168 hours. Coagulation Profile: Recent Labs  Lab 10/10/20 2138  INR 1.0   Cardiac Enzymes: No results for input(s): CKTOTAL, CKMB, CKMBINDEX, TROPONINI in the last 168 hours. BNP (last 3 results) No results for input(s): PROBNP in the last 8760 hours. HbA1C: No results for input(s): HGBA1C in the last 72 hours. CBG: Recent Labs  Lab 10/13/20 1645 10/13/20 2127 10/14/20 0746 10/14/20 1101 10/14/20 1313  GLUCAP 158* 219* 160* 67* 214*   Lipid Profile: Recent Labs    10/13/20 0453  CHOL 217*  HDL 41  LDLCALC 116*  TRIG 302*  CHOLHDL 5.3   Thyroid Function Tests: Recent Labs    10/12/20 0832  FREET4 1.13*  T3FREE 4.0   Anemia Panel: No results for input(s): VITAMINB12, FOLATE, FERRITIN, TIBC, IRON, RETICCTPCT in the last 72 hours. Sepsis  Labs: No results for input(s): PROCALCITON, LATICACIDVEN in the last 168 hours.  Recent Results (from the past 240 hour(s))  Resp Panel by RT-PCR (Flu A&B, Covid) Nasopharyngeal Swab     Status: None   Collection Time: 10/10/20  9:04 PM   Specimen: Nasopharyngeal Swab; Nasopharyngeal(NP) swabs in vial transport medium  Result Value Ref Range Status   SARS Coronavirus 2 by RT PCR NEGATIVE NEGATIVE Final    Comment: (NOTE) SARS-CoV-2 target nucleic acids are NOT DETECTED.  The SARS-CoV-2 RNA is generally detectable in upper respiratory specimens during the acute phase of infection. The lowest concentration of SARS-CoV-2 viral copies this assay can detect is 138 copies/mL. A negative result does not preclude SARS-Cov-2 infection and should not be used as the sole basis for treatment or other patient management decisions. A negative result may occur with  improper specimen collection/handling, submission of specimen other than nasopharyngeal swab, presence of viral mutation(s) within the areas targeted by this assay, and inadequate  number of viral copies(<138 copies/mL). A negative result must be combined with clinical observations, patient history, and epidemiological information. The expected result is Negative.  Fact Sheet for Patients:  EntrepreneurPulse.com.au  Fact Sheet for Healthcare Providers:  IncredibleEmployment.be  This test is no t yet approved or cleared by the [place] FDA and  has been authorized for detection and/or diagnosis of SARS-CoV-2 by FDA under an Emergency Use Authorization (EUA). This EUA will remain  in effect (meaning this test can be used) for the duration of the COVID-19 declaration under Section 564(b)(1) of the Act, 21 U.S.C.section 360bbb-3(b)(1), unless the authorization is terminated  or revoked sooner.       Influenza A by PCR NEGATIVE NEGATIVE Final   Influenza B by PCR NEGATIVE NEGATIVE Final    Comment: (NOTE) The Xpert Xpress SARS-CoV-2/FLU/RSV plus assay is intended as an aid in the diagnosis of influenza from Nasopharyngeal swab specimens and should not be used as a sole basis for treatment. Nasal washings and aspirates are unacceptable for Xpert Xpress SARS-CoV-2/FLU/RSV testing.  Fact Sheet for Patients: EntrepreneurPulse.com.au  Fact Sheet for Healthcare Providers: IncredibleEmployment.be  This test is not yet approved or cleared by the [place] FDA and has been authorized for detection and/or diagnosis of SARS-CoV-2 by FDA under an Emergency Use Authorization (EUA). This EUA will remain in effect (meaning this test can be used) for the duration of the COVID-19 declaration under Section 564(b)(1) of the Act, 21 U.S.C. section 360bbb-3(b)(1), unless the authorization is terminated or revoked.  Performed at [facility], [place] [address], [place], [place] [address]          Radiology Studies: CARDIAC CATHETERIZATION  Result Date: 07/16/1190  LV end  diastolic pressure is severely elevated. LVEDP 29 mm Hg.  There is no aortic valve stenosis.  No angiographically apparent CAD.  Ao sat 98%, PA sat 69%, PA pressure 34/22, mean 26 mm Hg; mean PCWP 26 mm Hg; CO 4.96 L/min; CI 2.09  Continue medical therapy for nonischemic cardiomyopathy.        Scheduled Meds: . atorvastatin  10 mg Oral Daily  . brimonidine  1 drop Both Eyes BID  . cyclobenzaprine  10 mg Oral TID  . digoxin  0.125 mg Oral Daily  . enoxaparin (LOVENOX) injection  60 mg Subcutaneous Q24H  . feeding supplement  237 mL Oral BID BM  . furosemide  60 mg Intravenous Q12H  . insulin aspart  0-15 Units Subcutaneous TID WC  . latanoprost  1 drop Both Eyes QHS  . levothyroxine  100 mcg Oral Q0600  . melatonin  3 mg Oral QHS  . polyethylene glycol  17 g Oral Daily  . risperiDONE  0.5 mg Oral QHS  . sacubitril-valsartan  1 tablet Oral BID  . sodium chloride flush  3 mL Intravenous Q12H  . sodium chloride flush  3 mL Intravenous Q12H  . spironolactone  12.5 mg Oral Daily  . tamsulosin  0.4 mg Oral Daily   Continuous Infusions: . sodium chloride       LOS: 3 days    Time spent: 25 mins.More than 50% of that time was spent in counseling and/or coordination of care.      Shelly Coss, MD [facility] P12/05/2020, 1:56 PM

## 2020-10-14 NOTE — Plan of Care (Signed)

## 2020-10-14 NOTE — Progress Notes (Signed)
TR BAND REMOVAL  LOCATION:  right radial  DEFLATED PER PROTOCOL:  Yes.    TIME BAND OFF / DRESSING APPLIED:   1715   SITE UPON ARRIVAL:   Level 0  SITE AFTER BAND REMOVAL:  Level 0  CIRCULATION SENSATION AND MOVEMENT:  Within Normal Limits  Yes.    COMMENTS:

## 2020-10-14 NOTE — Progress Notes (Signed)
PT Cancellation Note  Patient Details Name: Charles [name] MRN: [id number] DOB: [birth year]   Cancelled Treatment:    Reason Eval/Treat Not Completed: Patient at procedure or test/unavailable  Pt at Cath Lab, will f/u as able.  [name], PT Acute Rehab Services Pager [phone] [facility] [place] 10/14/2020, 10:07 AM

## 2020-10-14 NOTE — Progress Notes (Signed)
Heart Failure Stewardship Pharmacist Progress Note   PCP: Patient, No Pcp Per PCP-Cardiologist: [name], MD    HPI:  54 yo M with PMH of HTN, diabetes, HLD, hypothyroidism, bipolar disorder, paranoid schizophrenia, and tobacco abuse. He presented to the ED on 10/10/20 with worsening shortness of breath and has been admitted for acute CHF. ECHO was done on 10/11/20 and LVEF was severely decreased to 20-25%. R/LHC done on 10/14/20 with no apparent CAD, PAP 26, PCWP 26, CO 4.96, CI 2.09.  Current HF Medications: Furosemide 60 mg IV q12h Entresto 24/26 mg BID Spironolactone 12.5 mg daily Digoxin 0.125 mg daily  Prior to admission HF Medications: Metoprolol tartrate 25 mg BID Lisinopril 2.5 mg daily  Pertinent Lab Values: . Serum creatinine 1.14, BUN 23, Potassium 4.0, Sodium 133, BNP 97, Digoxin level due around 12/10    Vital Signs: . Weight: 275 lbs (admission weight: 281 lbs) . Blood pressure: 13/90s  . Heart rate: 110s   Medication Assistance / Insurance Benefits Check: Does the patient have prescription insurance?  Yes Type of insurance plan: [facility] Medicaid  Outpatient Pharmacy:  Prior to admission outpatient pharmacy: [facility] Is the patient willing to use [facility] at discharge? Yes Is the patient willing to transition their outpatient pharmacy to utilize a [facility] outpatient pharmacy?   Pending    Assessment: 1. Acute systolic CHF (EF 68-08%), due to NICM. NYHA class II/III symptoms. - Continue furosemide 60 mg IV q12h - Hold off initiating beta blocker until more euvolemic - Agree with transitioning to Entresto 24/26 mg BID - Continue spironolactone 12.5 mg daily - Consider starting Farxiga 10 mg daily prior to discharge - Continue digoxin 0.125 mg daily. Recommend checking a level around 10/17/20.   Plan: 1) Medication changes recommended at this time: - Agree with starting Entresto today  2) Patient assistance application(s): - None  pending, has Medicaid - Copays $3 and less  3)  Education  - To be completed prior to discharge  Kerby Nora, PharmD, BCPS Heart Failure Stewardship Pharmacist Phone [phone]

## 2020-10-14 NOTE — Progress Notes (Signed)
Pt left with [facility] to [facility] for heart cath.

## 2020-10-14 NOTE — Progress Notes (Signed)
   10/14/20 1408  Assess: MEWS Score  Temp 98.1 F (36.7 C)  BP 112/71  Pulse Rate 99  ECG Heart Rate (!) 113  Resp 20  Level of Consciousness Alert  SpO2 98 %  O2 Device Room Air  Assess: MEWS Score  MEWS Temp 0  MEWS Systolic 0  MEWS Pulse 2  MEWS RR 0  MEWS LOC 0  MEWS Score 2  MEWS Score Color Yellow  Assess: if the MEWS score is Yellow or Red  Were vital signs taken at a resting state? Yes  Focused Assessment No change from prior assessment  Early Detection of Sepsis Score *See Row Information* Low  MEWS guidelines implemented *See Row Information* Yes  Treat  MEWS Interventions Administered scheduled meds/treatments  Pain Scale 0-10  Pain Score 0  Take Vital Signs  Increase Vital Sign Frequency  Yellow: Q 2hr X 2 then Q 4hr X 2, if remains yellow, continue Q 4hrs  Escalate  MEWS: Escalate Yellow: discuss with charge nurse/RN and consider discussing with provider and RRT  Notify: Charge Nurse/RN  Name of Charge Nurse/RN Notified Abiagil, RN  Date Charge Nurse/RN Notified 10/14/20  Time Charge Nurse/RN Notified 1416  Document  Patient Outcome Other (Comment)  Progress note created (see row info) Yes

## 2020-10-14 NOTE — Interval H&P Note (Signed)
Cath Lab Visit (complete for each Cath Lab visit)  Clinical Evaluation Leading to the Procedure:   ACS: No.  Non-ACS:    Anginal Classification: CCS IV  Anti-ischemic medical therapy: Minimal Therapy (1 class of medications)  Non-Invasive Test Results: High-risk stress test findings: cardiac mortality >3%/year  Prior CABG: No previous CABG    Low EF by echo  History and Physical Interval Note:  10/14/2020 9:10 AM  Charles [name]  has presented today for surgery, with the diagnosis of chest pain, short of breath.  The various methods of treatment have been discussed with the patient and family. After consideration of risks, benefits and other options for treatment, the patient has consented to  Procedure(s): RIGHT/LEFT HEART CATH AND CORONARY ANGIOGRAPHY (N/A) as a surgical intervention.  The patient's history has been reviewed, patient examined, no change in status, stable for surgery.  I have reviewed the patient's chart and labs.  Questions were answered to the patient's satisfaction.     Larae Grooms

## 2020-10-14 NOTE — Progress Notes (Addendum)
OT Cancellation Note  Patient Details Name: Charles [name] MRN: [id number] DOB: [birth year]   Cancelled Treatment:    Reason Eval/Treat Not Completed: Patient at procedure or test/ unavailable in cath lab. OT will check back as time allows.   Gloris Manchester OTR/L Supplemental OT, Department of rehab services [phone]  Jadan Hinojos R H. 10/14/2020, 12:10 PM

## 2020-10-15 DIAGNOSIS — I5021 Acute systolic (congestive) heart failure: Secondary | ICD-10-CM | POA: Diagnosis not present

## 2020-10-15 LAB — CBC WITH DIFFERENTIAL/PLATELET
Abs Immature Granulocytes: 0.06 10*3/uL (ref 0.00–0.07)
Basophils Absolute: 0 10*3/uL (ref 0.0–0.1)
Basophils Relative: 0 %
Eosinophils Absolute: 0.1 10*3/uL (ref 0.0–0.5)
Eosinophils Relative: 1 %
HCT: 48.4 % (ref 39.0–52.0)
Hemoglobin: 15.6 g/dL (ref 13.0–17.0)
Immature Granulocytes: 1 %
Lymphocytes Relative: 25 %
Lymphs Abs: 3 10*3/uL (ref 0.7–4.0)
MCH: 24.8 pg — ABNORMAL LOW (ref 26.0–34.0)
MCHC: 32.2 g/dL (ref 30.0–36.0)
MCV: 76.8 fL — ABNORMAL LOW (ref 80.0–100.0)
Monocytes Absolute: 1 10*3/uL (ref 0.1–1.0)
Monocytes Relative: 8 %
Neutro Abs: 8.1 10*3/uL — ABNORMAL HIGH (ref 1.7–7.7)
Neutrophils Relative %: 65 %
Platelets: 203 10*3/uL (ref 150–400)
RBC: 6.3 MIL/uL — ABNORMAL HIGH (ref 4.22–5.81)
RDW: 18.8 % — ABNORMAL HIGH (ref 11.5–15.5)
WBC: 12.3 10*3/uL — ABNORMAL HIGH (ref 4.0–10.5)
nRBC: 0 % (ref 0.0–0.2)

## 2020-10-15 LAB — BASIC METABOLIC PANEL
Anion gap: 12 (ref 5–15)
BUN: 22 mg/dL — ABNORMAL HIGH (ref 6–20)
CO2: 26 mmol/L (ref 22–32)
Calcium: 9.4 mg/dL (ref 8.9–10.3)
Chloride: 95 mmol/L — ABNORMAL LOW (ref 98–111)
Creatinine, Ser: 1.11 mg/dL (ref 0.61–1.24)
GFR, Estimated: >60 mL/min (ref >60)
Glucose, Bld: 178 mg/dL — ABNORMAL HIGH (ref 70–99)
Potassium: 4.1 mmol/L (ref 3.5–5.1)
Sodium: 133 mmol/L — ABNORMAL LOW (ref 135–145)

## 2020-10-15 LAB — GLUCOSE, CAPILLARY
Glucose-Capillary: 209 mg/dL — ABNORMAL HIGH (ref 70–99)
Glucose-Capillary: 216 mg/dL — ABNORMAL HIGH (ref 70–99)
Glucose-Capillary: 240 mg/dL — ABNORMAL HIGH (ref 70–99)
Glucose-Capillary: 294 mg/dL — ABNORMAL HIGH (ref 70–99)

## 2020-10-15 MED ORDER — FLEET ENEMA 7-19 GM/118ML RE ENEM: 1.0000 | ENEMA | Freq: Every day | RECTAL | Status: DC | PRN

## 2020-10-15 MED ORDER — INSULIN DETEMIR 100 UNIT/ML ~~LOC~~ SOLN
8.0000 [IU] | Freq: Every day | SUBCUTANEOUS | Status: DC
  Administered 2020-10-15: 8 [IU] via SUBCUTANEOUS
  Filled 2020-10-15 (×2): qty 0.08

## 2020-10-15 MED ORDER — POLYETHYLENE GLYCOL 3350 17 G PO PACK
17.0000 g | PACK | Freq: Two times a day (BID) | ORAL | Status: DC
  Administered 2020-10-15 – 2020-10-23 (×11): 17 g via ORAL
  Filled 2020-10-15 (×16): qty 1

## 2020-10-15 MED ORDER — FUROSEMIDE 10 MG/ML IJ SOLN
80.0000 mg | Freq: Two times a day (BID) | INTRAMUSCULAR | Status: DC
  Administered 2020-10-15 – 2020-10-21 (×13): 80 mg via INTRAVENOUS
  Filled 2020-10-15 (×13): qty 8

## 2020-10-15 MED ORDER — BISACODYL 10 MG RE SUPP
10.0000 mg | Freq: Once | RECTAL | Status: AC
  Administered 2020-10-15: 10 mg via RECTAL
  Filled 2020-10-15: qty 1

## 2020-10-15 MED ORDER — SENNA 8.6 MG PO TABS
2.0000 | ORAL_TABLET | Freq: Every day | ORAL | Status: DC
  Administered 2020-10-15 – 2020-10-17 (×3): 17.2 mg via ORAL
  Filled 2020-10-15 (×4): qty 2

## 2020-10-15 MED ORDER — SPIRONOLACTONE 25 MG PO TABS
25.0000 mg | ORAL_TABLET | Freq: Every day | ORAL | Status: DC
  Administered 2020-10-15 – 2020-10-23 (×9): 25 mg via ORAL
  Filled 2020-10-15 (×9): qty 1

## 2020-10-15 NOTE — Evaluation (Signed)
Occupational Therapy Evaluation Patient Details Name: Charles [name] MRN: [id number] DOB: 08/22/1966 Today's Date: 10/15/2020    History of Present Illness 54 yo M with PMH of HTN, diabetes, HLD, hypothyroidism, bipolar disorder, paranoid schizophrenia, and tobacco abuse. He presented to the ED on 10/10/20 with worsening shortness of breath and has been admitted for acute CHF. ECHO was done on 10/11/20 and LVEF was severely decreased to 20-25% Cardiac cath performed 12/7 revealed no CAD.   Clinical Impression   Pt PTA: Pt living in ALF and is mostly independent with UB ADL and mobility, but does require assist for LB at baseline. Pt currently pt performing ADL routine at commode and sink, supervisionA. Pt ambulating to bathroom with no AD with supervisionA. Pt with no LOB episodes. Pt would benefit from continued OT skilled services for ADL, mobiltiy and safety.  Pt c/o SOB, but O2 >90%. HR 115- 137 BPM with exertion.  Pt appears at his functional baseline for ADL and ADL mobility. OT signing off. Thank you.    Follow Up Recommendations  No OT follow up;Supervision - Intermittent    Equipment Recommendations  None recommended by OT    Recommendations for Other Services       Precautions / Restrictions Precautions Precautions: Fall Restrictions Weight Bearing Restrictions: No      Mobility Bed Mobility Overal bed mobility: Needs Assistance Bed Mobility: Supine to Sit     Supine to sit: Supervision     General bed mobility comments: supervisionA    Transfers Overall transfer level: Needs assistance Equipment used: None Transfers: Sit to/from Stand Sit to Stand: Supervision              Balance Overall balance assessment: Mild deficits observed, not formally tested                                         ADL either performed or assessed with clinical judgement   ADL Overall ADL's : At baseline                                        General ADL Comments: pt performing ADL routine at commode and sink. Pt requires assist for LB ADL dressing and bathing at baseline. Pt ambulating to bathroom with no AD. pt with no LOB episodes. Pt performing grooming tasks at sink with no physical assist required. Pt c/o SOB, but O2 >90%.     Vision Baseline Vision/History: Glaucoma Patient Visual Report: Other (comment) (R eye with glaucoma, dry eyes) Vision Assessment?: Yes Eye Alignment: Within Functional Limits Ocular Range of Motion: Within Functional Limits Additional Comments:  can see clock (digital) 903     Perception     Praxis      Pertinent Vitals/Pain Pain Assessment: 0-10 Pain Score: 1  Pain Location: discomfort when breathing in/out Pain Descriptors / Indicators: Discomfort     Hand Dominance Right   Extremity/Trunk Assessment Upper Extremity Assessment Upper Extremity Assessment: Generalized weakness   Lower Extremity Assessment Lower Extremity Assessment: Generalized weakness   Cervical / Trunk Assessment Cervical / Trunk Assessment: Normal   Communication Communication Communication: No difficulties   Cognition Arousal/Alertness: Awake/alert Behavior During Therapy: WFL for tasks assessed/performed Overall Cognitive Status: Within Functional Limits for tasks assessed  General Comments  HR115- 137 BPM with exertion; O2 >90% on RA.    Exercises     Shoulder Instructions      Home Living Family/patient expects to be discharged to:: Other (Comment)                                 Additional Comments: ALF      Prior Functioning/Environment Level of Independence: Needs assistance  Gait / Transfers Assistance Needed: walking with RW ADL's / Homemaking Assistance Needed: mostly independent with ADL; occasionally assisted with bathing due to recent back surgery  due to numbness below abdomen and knee pain            OT  Problem List: Decreased activity tolerance      OT Treatment/Interventions:      OT Goals(Current goals can be found in the care plan section)    OT Frequency:     Barriers to D/C:            Co-evaluation              AM-PAC OT "6 Clicks" Daily Activity     Outcome Measure Help from another person eating meals?: None Help from another person taking care of personal grooming?: A Little Help from another person toileting, which includes using toliet, bedpan, or urinal?: A Little Help from another person bathing (including washing, rinsing, drying)?: A Little Help from another person to put on and taking off regular upper body clothing?: A Little Help from another person to put on and taking off regular lower body clothing?: A Little 6 Click Score: 19   End of Session Nurse Communication: Mobility status  Activity Tolerance: Patient tolerated treatment well Patient left: in chair;with call bell/phone within reach;with chair alarm set  OT Visit Diagnosis: Unsteadiness on feet (R26.81)                Time: 5176-1607 OT Time Calculation (min): 28 min Charges:  OT General Charges $OT Visit: 1 Visit OT Evaluation $OT Eval Moderate Complexity: 1 Mod OT Treatments $Self Care/Home Management : 8-22 mins  Jefferey Pica, OTR/L Acute Rehabilitation Services Pager: [phone] Office: [phone]   Tanikka Bresnan C 10/15/2020, 2:20 PM

## 2020-10-15 NOTE — Plan of Care (Signed)

## 2020-10-15 NOTE — Progress Notes (Signed)
PROGRESS NOTE    Charles [name]  LGX:[id number] DOB: 1966/02/12 DOA: 10/10/2020 PCP: Patient, No Pcp Per   Chief Complain: Shortness of breath  Brief Narrative: [name] a 54 year old male with history of hypertension, diabetes type 2, obesity who presented to the emergency department from ALF with complaints of shortness of breath.  He was recently suspected to have lymphoma due to finding of metastatic lymphadenopathy and was following with oncology and has been planned for biopsy by CT surgery. He follows with Dr. Lorenso Courier. On presentation he was tachycardic, hypoxic needing 2 L of oxygen per minute.  Chest x-ray showed possible pulmonary edema.  Also was endorsing shortness of breath. CT angiogram of the chest did not show any PE but showed worsening mediastinal lymphadenopathy. Echocardiogram showed severe reduction in ejection fraction of 20 to 25%,which is a new finding.    Assessment & Plan:   Acute congestive heart failure with reduced ejection fraction:  Presented with dyspnea.  Appeared volume overloaded on presentation but BNP was normal.  Echo done here showed ejection fraction of 20 to 25% was a new finding.  Cardiology consulted.  Patient placed on IV diuretics.  Also noted to be on digoxin.  He was also started on Entresto.  Strict ins and outs and daily weights.  Patient still continues to have significant volume overload.  Lasix dose increased today.  Due to acute heart failure he is no longer on carvedilol.  Plan is to add once CHF improves. Patient underwent cardiac catheterization on 12/7 which reveals normal coronary artery disease.  Reason for his cardiomyopathy is not entirely clear.  Acute hypoxic respiratory failure Secondary to CHF exacerbation.  CT angiogram did not show any PE. He has been weaned down to room air.    Mediastinal lymphadenopathy Follows with oncology.  High suspicion for Hodgkin's lymphoma.  CT imaging showed progressive lymphadenopathy.  [name]  following up with CT surgery soon for the biopsy of the lymph nodes.  We recommend to follow-up with CT surgery and oncology as an outpatient as soon as possible after discharge. Current focus is  on stabilizing his respiratory / volume status.  Essential hypertension/sinus tachycardia Pressure is reasonably well controlled.  Continue to monitor closely.  Diabetes type 2 On Metformin at home.  Continue sliding scale insulin.  Hemoglobin A1c of 8.1.  CBGs noted to be elevated.  Will add low-dose Lantus.  Hypothyroidism Continue Synthyroid.  Mild elevated TSH level.Free t4 mildly elevated.  Results inconclusive. Repeat TFTs in 4 to 6 weeks.  BPH Takes Flomax  Hyperlipidemia Takes Lipitor at home.  Morbid obesity Estimated body mass index is 40.24 kg/m as calculated from the following:   Height as of this encounter: 5\' 9"  (1.753 m).   Weight as of this encounter: 123.6 kg.   DVT prophylaxis: Lovenox Code Status: Full Family Communication: None at bedside Disposition: Hopefully return back to ALF when improved.  Status is: Inpatient  Remains inpatient appropriate because:Inpatient level of care appropriate due to severity of illness   Dispo: The patient is from: Home( ALF)              Anticipated d/c is to: Home              Anticipated d/c date is: 1-2 days              Patient currently is not medically stable to d/c.     Consultants: Cardiology  Procedures: Cardiac catheterization 81/4  LV end diastolic  pressure is severely elevated. LVEDP 29 mm Hg.  There is no aortic valve stenosis.  No angiographically apparent CAD.  Ao sat 98%, PA sat 69%, PA pressure 34/22, mean 26 mm Hg; mean PCWP 26 mm Hg; CO 4.96 L/min; CI 2.09    Antimicrobials:  Anti-infectives (From admission, onward)   None      Subjective: Patient complains of chest pain as well as shortness of breath this morning.  Describes it as a sharp pain in his central chest.  No dizziness or  lightheadedness.  No nausea vomiting.  Feels fatigued.  Objective: Vitals:   10/15/20 0500 10/15/20 0533 10/15/20 0737 10/15/20 0738  BP: 114/72 114/72 (!) 142/47 (!) 142/47  Pulse: 100 100 (!) 118 (!) 118  Resp: 16 16 18 18   Temp: 98.2 F (36.8 C)  98.2 F (36.8 C) 98.2 F (36.8 C)  TempSrc:   Oral   SpO2:  99% 93%   Weight:      Height:        Intake/Output Summary (Last 24 hours) at 10/15/2020 1106 Last data filed at 10/15/2020 0432 Gross per 24 hour  Intake 465 ml  Output 1625 ml  Net -1160 ml   Filed Weights   10/13/20 0511 10/13/20 1709 10/15/20 0456  Weight: 123.9 kg 124.9 kg 123.6 kg    Examination:  General appearance: Awake alert.  In no distress Resp: Crackles bilateral bases.  No wheezing or rhonchi.  Normal effort at rest. Cardio: S1-S2 is tachycardic regular.  No S3-S4.  No rubs murmurs or bruit GI: Abdomen is soft.  Nontender nondistended.  Bowel sounds are present normal.  No masses organomegaly Extremities: 1+ edema bilateral lower extremities Neurologic: Alert and oriented x3.  No focal neurological deficits.       Data Reviewed: I have personally reviewed following labs and imaging studies  CBC: Recent Labs  Lab 10/10/20 2102 10/10/20 2102 10/11/20 0839 10/11/20 0839 10/12/20 0500 10/13/20 0453 10/14/20 1002 10/14/20 1019 10/15/20 0406  WBC 11.6*  --  10.4  --  10.0 9.8  --   --  12.3*  NEUTROABS 6.6  --  6.5  --  5.8 5.1  --   --  8.1*  HGB 15.1   < > 16.2   < > 16.5 16.1 18.0* 18.4* 15.6  HCT 46.7   < > 49.1   < > 50.7 49.9 53.0* 54.0* 48.4  MCV 77.4*  --  77.7*  --  76.6* 77.2*  --   --  76.8*  PLT 204  --  213  --  156 204  --   --  203   < > = values in this interval not displayed.   Basic Metabolic Panel: Recent Labs  Lab 10/11/20 0839 10/11/20 0839 10/12/20 0832 10/12/20 0832 10/13/20 0453 10/14/20 0445 10/14/20 1002 10/14/20 1019 10/15/20 0406  NA 135   < > 134*   < > 136 133* 135 136 133*  K 3.9   < > 4.0   < >  3.9 4.0 3.8 4.0 4.1  CL 97*  --  98  --  96* 96*  --   --  95*  CO2 27  --  25  --  25 24  --   --  26  GLUCOSE 154*  --  156*  --  170* 164*  --   --  178*  BUN 17  --  20  --  22* 23*  --   --  22*  CREATININE 1.16  --  1.24  --  1.21 1.14  --   --  1.11  CALCIUM 9.5  --  9.3  --  9.2 9.2  --   --  9.4   < > = values in this interval not displayed.   GFR: Estimated Creatinine Clearance: 96.6 mL/min (by C-G formula based on SCr of 1.11 mg/dL).  Coagulation Profile: Recent Labs  Lab 10/10/20 2138  INR 1.0    CBG: Recent Labs  Lab 10/14/20 1101 10/14/20 1313 10/14/20 1620 10/14/20 2040 10/15/20 0735  GLUCAP 67* 214* 251* 236* 209*   Lipid Profile: Recent Labs    10/13/20 0453  CHOL 217*  HDL 41  LDLCALC 116*  TRIG 302*  CHOLHDL 5.3     Recent Results (from the past 240 hour(s))  Resp Panel by RT-PCR (Flu A&B, Covid) Nasopharyngeal Swab     Status: None   Collection Time: 10/10/20  9:04 PM   Specimen: Nasopharyngeal Swab; Nasopharyngeal(NP) swabs in vial transport medium  Result Value Ref Range Status   SARS Coronavirus 2 by RT PCR NEGATIVE NEGATIVE Final    Comment: (NOTE) SARS-CoV-2 target nucleic acids are NOT DETECTED.  The SARS-CoV-2 RNA is generally detectable in upper respiratory specimens during the acute phase of infection. The lowest concentration of SARS-CoV-2 viral copies this assay can detect is 138 copies/mL. A negative result does not preclude SARS-Cov-2 infection and should not be used as the sole basis for treatment or other patient management decisions. A negative result may occur with  improper specimen collection/handling, submission of specimen other than nasopharyngeal swab, presence of viral mutation(s) within the areas targeted by this assay, and inadequate number of viral copies(<138 copies/mL). A negative result must be combined with clinical observations, patient history, and epidemiological information. The expected result is  Negative.  Fact Sheet for Patients:  EntrepreneurPulse.com.au  Fact Sheet for Healthcare Providers:  IncredibleEmployment.be  This test is no t yet approved or cleared by the [place] FDA and  has been authorized for detection and/or diagnosis of SARS-CoV-2 by FDA under an Emergency Use Authorization (EUA). This EUA will remain  in effect (meaning this test can be used) for the duration of the COVID-19 declaration under Section 564(b)(1) of the Act, 21 U.S.C.section 360bbb-3(b)(1), unless the authorization is terminated  or revoked sooner.       Influenza A by PCR NEGATIVE NEGATIVE Final   Influenza B by PCR NEGATIVE NEGATIVE Final    Comment: (NOTE) The Xpert Xpress SARS-CoV-2/FLU/RSV plus assay is intended as an aid in the diagnosis of influenza from Nasopharyngeal swab specimens and should not be used as a sole basis for treatment. Nasal washings and aspirates are unacceptable for Xpert Xpress SARS-CoV-2/FLU/RSV testing.  Fact Sheet for Patients: EntrepreneurPulse.com.au  Fact Sheet for Healthcare Providers: IncredibleEmployment.be  This test is not yet approved or cleared by the [place] FDA and has been authorized for detection and/or diagnosis of SARS-CoV-2 by FDA under an Emergency Use Authorization (EUA). This EUA will remain in effect (meaning this test can be used) for the duration of the COVID-19 declaration under Section 564(b)(1) of the Act, 21 U.S.C. section 360bbb-3(b)(1), unless the authorization is terminated or revoked.  Performed at [facility], [place] [address] [place], [place] [address]          Radiology Studies: CARDIAC CATHETERIZATION  Result Date: 88/02/1659  LV end diastolic pressure is severely elevated. LVEDP 29 mm Hg.  There is no aortic valve stenosis.  No angiographically apparent CAD.  Ao sat 98%, PA sat 69%, PA pressure 34/22,  mean 26 mm Hg; mean PCWP 26 mm Hg; CO 4.96 L/min; CI 2.09  Continue medical therapy for nonischemic cardiomyopathy.        Scheduled Meds: . atorvastatin  10 mg Oral Daily  . brimonidine  1 drop Both Eyes BID  . cyclobenzaprine  10 mg Oral TID  . digoxin  0.125 mg Oral Daily  . enoxaparin (LOVENOX) injection  60 mg Subcutaneous Q24H  . feeding supplement  237 mL Oral BID BM  . furosemide  80 mg Intravenous Q12H  . insulin aspart  0-15 Units Subcutaneous TID WC  . latanoprost  1 drop Both Eyes QHS  . levothyroxine  100 mcg Oral Q0600  . melatonin  3 mg Oral QHS  . polyethylene glycol  17 g Oral Daily  . risperiDONE  0.5 mg Oral QHS  . sacubitril-valsartan  1 tablet Oral BID  . sodium chloride flush  3 mL Intravenous Q12H  . sodium chloride flush  3 mL Intravenous Q12H  . spironolactone  25 mg Oral Daily  . tamsulosin  0.4 mg Oral Daily   Continuous Infusions: . sodium chloride       LOS: 4 days      Bonnielee Haff, MD [facility] P12/06/2020, 11:06 AM

## 2020-10-15 NOTE — Progress Notes (Signed)
Progress Note  Patient Name: Charles [name] Date of Encounter: 10/15/2020  [facility] Cardiologist: [name], MD   Subjective   Pt with mild dyspnea; no CP  Inpatient Medications    Scheduled Meds: . atorvastatin  10 mg Oral Daily  . brimonidine  1 drop Both Eyes BID  . cyclobenzaprine  10 mg Oral TID  . digoxin  0.125 mg Oral Daily  . enoxaparin (LOVENOX) injection  60 mg Subcutaneous Q24H  . feeding supplement  237 mL Oral BID BM  . furosemide  60 mg Intravenous Q12H  . insulin aspart  0-15 Units Subcutaneous TID WC  . latanoprost  1 drop Both Eyes QHS  . levothyroxine  100 mcg Oral Q0600  . melatonin  3 mg Oral QHS  . polyethylene glycol  17 g Oral Daily  . risperiDONE  0.5 mg Oral QHS  . sacubitril-valsartan  1 tablet Oral BID  . sodium chloride flush  3 mL Intravenous Q12H  . sodium chloride flush  3 mL Intravenous Q12H  . spironolactone  12.5 mg Oral Daily  . tamsulosin  0.4 mg Oral Daily   Continuous Infusions: . sodium chloride     PRN Meds: sodium chloride, acetaminophen **OR** acetaminophen, alum & mag hydroxide-simeth, guaiFENesin-codeine, magnesium hydroxide, ondansetron **OR** [DISCONTINUED] ondansetron (ZOFRAN) IV, sodium chloride flush   Vital Signs    Vitals:   10/15/20 0456 10/15/20 0500 10/15/20 0533 10/15/20 0737  BP: 107/85 114/72 114/72 (!) 142/47  Pulse: (!) 114 100 100 (!) 118  Resp: 18 16 16 18   Temp: 98.2 F (36.8 C) 98.2 F (36.8 C)  98.2 F (36.8 C)  TempSrc: Oral   Oral  SpO2: 99%  99% 93%  Weight: 123.6 kg     Height:        Intake/Output Summary (Last 24 hours) at 10/15/2020 0749 Last data filed at 10/15/2020 0432 Gross per 24 hour  Intake 465 ml  Output 1925 ml  Net -1460 ml   Last 3 Weights 10/15/2020 10/13/2020 10/13/2020  Weight (lbs) 272 lb 7.8 oz 275 lb 4 oz 273 lb 2.4 oz  Weight (kg) 123.6 kg 124.853 kg 123.9 kg      Telemetry    Sinus Tach with PVCs and couplet- Personally Reviewed   Physical  Exam   GEN: WD, obese NAD Neck: supple Cardiac: RRR, Positive gallop Respiratory: CTA; no wheeze GI: Soft, NT/ND, no masses MS: Trace edema; radial cath site with no hematoma Neuro:  No focal findings Psych: Normal affect   Labs    High Sensitivity Troponin:   Recent Labs  Lab 10/11/20 0254 10/11/20 0521  TROPONINIHS 28* 25*      Chemistry Recent Labs  Lab 10/13/20 0453 10/13/20 0453 10/14/20 0445 10/14/20 0445 10/14/20 1002 10/14/20 1019 10/15/20 0406  NA 136   < > 133*   < > 135 136 133*  K 3.9   < > 4.0   < > 3.8 4.0 4.1  CL 96*  --  96*  --   --   --  95*  CO2 25  --  24  --   --   --  26  GLUCOSE 170*  --  164*  --   --   --  178*  BUN 22*  --  23*  --   --   --  22*  CREATININE 1.21  --  1.14  --   --   --  1.11  CALCIUM 9.2  --  9.2  --   --   --  9.4  GFRNONAA >60  --  >60  --   --   --  >60  ANIONGAP 15  --  13  --   --   --  12   < > = values in this interval not displayed.     Hematology Recent Labs  Lab 10/12/20 0500 10/12/20 0500 10/13/20 0453 10/13/20 0453 10/14/20 1002 10/14/20 1019 10/15/20 0406  WBC 10.0  --  9.8  --   --   --  12.3*  RBC 6.62*  --  6.46*  --   --   --  6.30*  HGB 16.5   < > 16.1   < > 18.0* 18.4* 15.6  HCT 50.7   < > 49.9   < > 53.0* 54.0* 48.4  MCV 76.6*  --  77.2*  --   --   --  76.8*  MCH 24.9*  --  24.9*  --   --   --  24.8*  MCHC 32.5  --  32.3  --   --   --  32.2  RDW 19.7*  --  19.6*  --   --   --  18.8*  PLT 156  --  204  --   --   --  203   < > = values in this interval not displayed.    BNP Recent Labs  Lab 10/10/20 2102  BNP 97.4     Radiology    CARDIAC CATHETERIZATION  Result Date: 82/03/538  LV end diastolic pressure is severely elevated. LVEDP 29 mm Hg.  There is no aortic valve stenosis.  No angiographically apparent CAD.  Ao sat 98%, PA sat 69%, PA pressure 34/22, mean 26 mm Hg; mean PCWP 26 mm Hg; CO 4.96 L/min; CI 2.09  Continue medical therapy for nonischemic cardiomyopathy.     Patient Profile     54 y.o. 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  Echocardiogram this admission shows ejection fraction 20 to 25%, severe left ventricular enlargement.  Assessment & Plan    1 acute systolic congestive heart failure-I/O-1460; wt 123.6 kg.  Symptoms slowly improving.  Increase Lasix to 80 mg twice daily.  Increase spironolactone to 25 mg daily.  Follow renal function.    2 nonischemic cardiomyopathy-etiology unclear.  Cardiac catheterization reveals no coronary disease.  No history of alcohol abuse by patient report.  Continue Entresto and digoxin.  Titrate medications as tolerated by blood pressure.  We will add carvedilol later once CHF improves. Plan to repeat echocardiogram 3 months after medications fully titrated to see if LV function has improved.  If not we will need to consider ICD.   3 hypertension-blood pressure controlled this morning.  Titrate CHF medications as tolerated.  4 hyperlipidemia-continue statin.  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 Further evaluation once cardiac status improves.  Management per primary care.  For questions or updates, please contact [place] Please consult [email or web address] for contact info under        Signed, Kirk Ruths, MD  10/15/2020, 7:49 AM

## 2020-10-15 NOTE — Progress Notes (Signed)
No new orders. Continue to monitor.

## 2020-10-15 NOTE — Progress Notes (Signed)
Heart Failure Stewardship Pharmacist Progress Note   PCP: Patient, No Pcp Per PCP-Cardiologist: [name], MD    HPI:  54 yo M with PMH of HTN, diabetes, HLD, hypothyroidism, bipolar disorder, paranoid schizophrenia, and tobacco abuse. He presented to the ED on 10/10/20 with worsening shortness of breath and has been admitted for acute CHF. ECHO was done on 10/11/20 and LVEF was severely decreased to 20-25%. R/LHC done on 10/14/20 with no apparent CAD, PAP 26, PCWP 26, CO 4.96, CI 2.09.  Current HF Medications: Furosemide 80 mg IV q12h Entresto 24/26 mg BID Spironolactone 25 mg daily Digoxin 0.125 mg daily  Prior to admission HF Medications: Metoprolol tartrate 25 mg BID Lisinopril 2.5 mg daily  Pertinent Lab Values: . Serum creatinine 1.14, BUN 23, Potassium 4.0, Sodium 133, BNP 97, Digoxin level due around 12/10    Vital Signs: . Weight: 275 lbs (admission weight: 281 lbs) . Blood pressure: 110-140/90s  . Heart rate: 110-130s   Medication Assistance / Insurance Benefits Check: Does the patient have prescription insurance?  Yes Type of insurance plan: [facility] Medicaid  Outpatient Pharmacy:  Prior to admission outpatient [facility] Is the patient willing to use [facility] at discharge? Yes Is the patient willing to transition their outpatient pharmacy to utilize a [facility] outpatient pharmacy?   Pending    Assessment: 1. Acute systolic CHF (EF 81-44%), due to NICM. NYHA class II/III symptoms. - Agree with increasing furosemide to 80 mg IV q12h - Hold off initiating beta blocker until more euvolemic - Continue Entresto 24/26 mg BID - Agree with increasing spironolactone to 25 mg daily - Consider starting Farxiga 10 mg daily prior to discharge - Continue digoxin 0.125 mg daily. Recommend checking a level around 10/17/20.   Plan: 1) Medication changes recommended at this time: - Agree with changes as above  2) Patient assistance application(s): -  None pending, has Medicaid - Copays $3 and less  3)  Education  - To be completed prior to discharge  Kerby Nora, PharmD, BCPS Heart Failure Stewardship Pharmacist Phone [phone]

## 2020-10-15 NOTE — Progress Notes (Signed)
Inpatient Diabetes Program Recommendations  AACE/ADA: New Consensus Statement on Inpatient Glycemic Control (2015)  Target Ranges:  Prepandial:   less than 140 mg/dL      Peak postprandial:   less than 180 mg/dL (1-2 hours)      Critically ill patients:  140 - 180 mg/dL   Lab Results  Component Value Date   GLUCAP 209 (H) 10/15/2020   HGBA1C 8.1 (H) 10/11/2020    Review of Glycemic Control Results for Charles [name], Charles [name] (MRN [id number]) as of 10/15/2020 10:31  Ref. Range 10/14/2020 16:20 10/14/2020 20:40 10/15/2020 07:35  Glucose-Capillary Latest Ref Range: 70 - 99 mg/dL 251 (H) 236 (H) 209 (H)   Diabetes history: Type 2 DM Outpatient Diabetes medications: Metformin 500 mg BID Current orders for Inpatient glycemic control: Novolog 0-15 units TID  Inpatient Diabetes Program Recommendations:    Consider adding Levemir 10 units QD.  Also, may want to consider addition of oral agent at discharge: Amaryl 4 mg QD.   Thanks, Bronson Curb, MSN, RNC-OB Diabetes Coordinator [phone] (8a-5p)

## 2020-10-15 NOTE — Care Management (Signed)
10-15-20 Pt has Medicaid- Delene Loll should be covered with Medicaid for $3.00. Case Manager will follow for additional transition of care needs. Graves-Bigelow, Ocie Cornfield, RN, BSN Case Manager

## 2020-10-15 NOTE — NC FL2 (Addendum)
[place] LEVEL OF CARE SCREENING TOOL     IDENTIFICATION  Patient Name: Charles [name] Birthdate: [birth year] Sex: male Admission Date (Current Location): 10/10/2020  [facility] and [place] Number:  Herbalist and Address:  [facility], [place] [address], [place], [place] [address]      Provider Number: [id number]  Attending Physician Name and Address:  Bonnielee Haff, MD  Relative Name and Phone Number:  Suanne Marker   [phone]    Current Level of Care: Hospital Recommended Level of Care: [place] Prior Approval Number:    Date Approved/Denied:   PASRR Number:    Discharge Plan: ALF/ [place]     Current Diagnoses: Patient Active Problem List   Diagnosis Date Noted  . Acute systolic heart failure ([place])   . Mediastinal lymphadenopathy 10/11/2020  . Shortness of breath 10/11/2020  . Sinus tachycardia 10/11/2020  . Night sweats 10/11/2020  . DM2 (diabetes mellitus, type 2) ([place]) 10/11/2020  . Lymphoma ([place]) 10/11/2020  . Hypertension 09/25/2020  . BPH (benign prostatic hyperplasia) 09/25/2020  . Insomnia 09/25/2020    Orientation RESPIRATION BLADDER Height & Weight     Self, Time, Situation, Place  Normal Continent Weight: 272 lb 7.8 oz (123.6 kg) Height:  5' 9"  (175.3 cm)  BEHAVIORAL SYMPTOMS/MOOD NEUROLOGICAL BOWEL NUTRITION STATUS      Continent DIET: NAS  AMBULATORY STATUS COMMUNICATION OF NEEDS Skin   Independent Verbally Normal                       Personal Care Assistance Level of Assistance  Bathing, Feeding, Dressing Bathing Assistance: Limited assistance Feeding assistance: Independent Dressing Assistance: Limited assistance     Functional Limitations Info  Sight, Hearing, Speech Sight Info: Adequate Hearing Info: Adequate Speech Info: Adequate    SPECIAL CARE FACTORS FREQUENCY  PT (By licensed PT), OT (By licensed OT)     PT Frequency: 3x min weekly/HH  orders OT Frequency: 3x min weekly/HH orders            Contractures Contractures Info: Not present    Additional Factors Info  Code Status, Allergies, Insulin Sliding Scale, Psychotropic Code Status Info: FULL Allergies Info: Ibuprofen,Penicillins Psychotropic Info: risperiDONE (RISPERDAL) tablet 0.5 mg daily at bedtime Insulin Sliding Scale Info: insulin aspart (novoLOG) injection 0-15 Units 3 times daily with meals,insulin detemir (LEVEMIR) injection 8 Units daily       TAKE these medications   acetaminophen 325 MG tablet Commonly known as: TYLENOL Take 650 mg by mouth every 6 (six) hours as needed.   atorvastatin 10 MG tablet Commonly known as: LIPITOR Take 10 mg by mouth daily.   blood glucose meter kit and supplies Kit Dispense based on patient and insurance preference. Use up to four times daily as directed. (FOR ICD-9 250.00, 250.01).   brimonidine 0.2 % ophthalmic solution Commonly known as: ALPHAGAN Place 1 drop into both eyes 2 (two) times daily.   cyclobenzaprine 10 MG tablet Commonly known as: FLEXERIL Take 10 mg by mouth 3 (three) times daily.   dapagliflozin propanediol 10 MG Tabs tablet Commonly known as: FARXIGA Take 1 tablet (10 mg total) by mouth daily.   digoxin 0.125 MG tablet Commonly known as: LANOXIN Take 1 tablet (0.125 mg total) by mouth daily.   furosemide 40 MG tablet Commonly known as: LASIX Take 1 tablet (40 mg total) by mouth daily.   insulin detemir 100 UNIT/ML FlexPen Commonly known  as: LEVEMIR Inject 25 Units into the skin daily.   INVEGA SUSTENNA IM Inject 1 pen into the muscle every 30 (thirty) days.   latanoprost 0.005 % ophthalmic solution Commonly known as: XALATAN Place 1 drop into both eyes at bedtime.   levothyroxine 100 MCG tablet Commonly known as: SYNTHROID Take 100 mcg by mouth daily before breakfast.   melatonin 3 MG Tabs tablet Take 1 tablet by mouth at bedtime.   metoprolol succinate 50  MG 24 hr tablet Commonly known as: TOPROL-XL Take 1 tablet (50 mg total) by mouth at bedtime. Take with or immediately following a meal.   PEN NEEDLES 29GX1/2" 29G X 12MM Misc 1 each by Does not apply route daily.   polyethylene glycol 17 g packet Commonly known as: MIRALAX / GLYCOLAX Take 17 g by mouth 2 (two) times daily.   risperiDONE 0.5 MG tablet Commonly known as: RISPERDAL Take 0.5 mg by mouth at bedtime.   sacubitril-valsartan 24-26 MG Commonly known as: ENTRESTO Take 1 tablet by mouth 2 (two) times daily.   senna 8.6 MG Tabs tablet Commonly known as: SENOKOT Take 2 tablets (17.2 mg total) by mouth 2 (two) times daily.   spironolactone 25 MG tablet Commonly known as: ALDACTONE Take 1 tablet (25 mg total) by mouth daily.   tamsulosin 0.4 MG Caps capsule Commonly known as: FLOMAX Take 0.4 mg by mouth daily.      Relevant Imaging Results:  Relevant Lab Results:   Additional Information SSN-[id number]  Trula Ore, LCSWA

## 2020-10-15 NOTE — Progress Notes (Signed)
Warm prune juice to help with BM.

## 2020-10-15 NOTE — Evaluation (Signed)
Physical Therapy Evaluation Patient Details Name: Charles [name] MRN: [id number] DOB: 09-Feb-1966 Today's Date: 10/15/2020   History of Present Illness  54 yo M with PMH of HTN, diabetes, HLD, hypothyroidism, bipolar disorder, paranoid schizophrenia, and tobacco abuse. He presented to the ED on 10/10/20 with worsening shortness of breath and has been admitted for acute CHF. ECHO was done on 10/11/20 and LVEF was severely decreased to 20-25% Cardiac cath performed 12/7 revealed no CAD.  Clinical Impression  Prior to admission, pt living at an ALF and uses a walker for mobility. Pt presents with balance deficits and decreased cardiopulmonary endurance. Ambulating 100 feet with a walker at a min guard assist level. SpO2 97% on RA, HR 131-141 (RN present). Would benefit from follow up HHPT to address deficits and maximize functional independence.    Follow Up Recommendations Home health PT;Supervision for mobility/OOB    Equipment Recommendations  None recommended by PT    Recommendations for Other Services       Precautions / Restrictions Precautions Precautions: Fall Restrictions Weight Bearing Restrictions: No      Mobility  Bed Mobility Overal bed mobility: Needs Assistance Bed Mobility: Supine to Sit     Supine to sit: Supervision     General bed mobility comments: OOB in chair    Transfers Overall transfer level: Needs assistance Equipment used: None Transfers: Sit to/from Stand Sit to Stand: Supervision            Ambulation/Gait Ambulation/Gait assistance: Min guard Gait Distance (Feet): 100 Feet Assistive device: Rolling walker (2 wheeled) Gait Pattern/deviations: Step-through pattern;Decreased stride length Gait velocity: decreased   General Gait Details: Cues for walker proximity, min guard for safety  Stairs            Wheelchair Mobility    Modified Rankin (Stroke Patients Only)       Balance Overall balance assessment: Needs  assistance Sitting-balance support: Feet supported Sitting balance-Leahy Scale: Good     Standing balance support: No upper extremity supported;During functional activity Standing balance-Leahy Scale: Fair                               Pertinent Vitals/Pain Pain Assessment: No/denies pain Pain Score: 1  Pain Location: discomfort when breathing in/out Pain Descriptors / Indicators: Discomfort    Home Living Family/patient expects to be discharged to:: Other (Comment)                 Additional Comments: ALF    Prior Function Level of Independence: Needs assistance   Gait / Transfers Assistance Needed: walking with RW  ADL's / Homemaking Assistance Needed: mostly independent with ADL; occasionally assisted with bathing due to recent back surgery  due to numbness below abdomen and knee pain        Hand Dominance   Dominant Hand: Right    Extremity/Trunk Assessment   Upper Extremity Assessment Upper Extremity Assessment: Overall WFL for tasks assessed    Lower Extremity Assessment Lower Extremity Assessment: Overall WFL for tasks assessed    Cervical / Trunk Assessment Cervical / Trunk Assessment: Normal  Communication   Communication: No difficulties  Cognition Arousal/Alertness: Awake/alert Behavior During Therapy: WFL for tasks assessed/performed Overall Cognitive Status: Within Functional Limits for tasks assessed  General Comments General comments (skin integrity, edema, etc.): HR115- 137 BPM with exertion; O2 >90% on RA.    Exercises     Assessment/Plan    PT Assessment Patient needs continued PT services  PT Problem List Decreased strength;Decreased activity tolerance;Decreased mobility;Decreased balance       PT Treatment Interventions DME instruction;Gait training;Functional mobility training;Therapeutic activities;Therapeutic exercise;Balance training;Patient/family  education    PT Goals (Current goals can be found in the Care Plan section)  Acute Rehab PT Goals Patient Stated Goal: not have to use the walker PT Goal Formulation: With patient Time For Goal Achievement: 10/29/20 Potential to Achieve Goals: Good    Frequency Min 3X/week   Barriers to discharge        Co-evaluation               AM-PAC PT "6 Clicks" Mobility  Outcome Measure Help needed turning from your back to your side while in a flat bed without using bedrails?: None Help needed moving from lying on your back to sitting on the side of a flat bed without using bedrails?: None Help needed moving to and from a bed to a chair (including a wheelchair)?: None Help needed standing up from a chair using your arms (e.g., wheelchair or bedside chair)?: None Help needed to walk in hospital room?: A Little Help needed climbing 3-5 steps with a railing? : A Lot 6 Click Score: 21    End of Session Equipment Utilized During Treatment: Gait belt Activity Tolerance: Patient tolerated treatment well Patient left: in chair;with call bell/phone within reach;with chair alarm set Nurse Communication: Mobility status PT Visit Diagnosis: Unsteadiness on feet (R26.81);Difficulty in walking, not elsewhere classified (R26.2)    Time: 6226-3335 PT Time Calculation (min) (ACUTE ONLY): 19 min   Charges:   PT Evaluation $PT Eval Moderate Complexity: 1 Mod          Wyona Almas, PT, DPT Acute Rehabilitation Services Pager [phone] Office [phone]   Deno Etienne 10/15/2020, 4:41 PM

## 2020-10-16 ENCOUNTER — Inpatient Hospital Stay: Payer: Self-pay

## 2020-10-16 LAB — GLUCOSE, CAPILLARY
Glucose-Capillary: 222 mg/dL — ABNORMAL HIGH (ref 70–99)
Glucose-Capillary: 231 mg/dL — ABNORMAL HIGH (ref 70–99)
Glucose-Capillary: 284 mg/dL — ABNORMAL HIGH (ref 70–99)
Glucose-Capillary: 290 mg/dL — ABNORMAL HIGH (ref 70–99)

## 2020-10-16 LAB — BASIC METABOLIC PANEL
Anion gap: 14 (ref 5–15)
BUN: 19 mg/dL (ref 6–20)
CO2: 25 mmol/L (ref 22–32)
Calcium: 9.5 mg/dL (ref 8.9–10.3)
Chloride: 91 mmol/L — ABNORMAL LOW (ref 98–111)
Creatinine, Ser: 1.19 mg/dL (ref 0.61–1.24)
GFR, Estimated: >60 mL/min (ref >60)
Glucose, Bld: 328 mg/dL — ABNORMAL HIGH (ref 70–99)
Potassium: 4.1 mmol/L (ref 3.5–5.1)
Sodium: 130 mmol/L — ABNORMAL LOW (ref 135–145)

## 2020-10-16 LAB — COOXEMETRY PANEL
Carboxyhemoglobin: 0.9 % (ref 0.5–1.5)
Methemoglobin: 0.9 % (ref 0.0–1.5)
O2 Saturation: 67.7 %
Total hemoglobin: 16.1 g/dL — ABNORMAL HIGH (ref 12.0–16.0)

## 2020-10-16 MED ORDER — INSULIN DETEMIR 100 UNIT/ML ~~LOC~~ SOLN
14.0000 [IU] | Freq: Every day | SUBCUTANEOUS | Status: DC
  Administered 2020-10-16 – 2020-10-17 (×2): 14 [IU] via SUBCUTANEOUS
  Filled 2020-10-16 (×4): qty 0.14

## 2020-10-16 MED ORDER — SODIUM CHLORIDE 0.9% FLUSH
10.0000 mL | Freq: Two times a day (BID) | INTRAVENOUS | Status: DC
  Administered 2020-10-16 – 2020-10-22 (×9): 10 mL
  Administered 2020-10-22: 10:00:00 20 mL

## 2020-10-16 MED ORDER — SODIUM CHLORIDE 0.9% FLUSH
10.0000 mL | INTRAVENOUS | Status: DC | PRN
  Administered 2020-10-20: 10:00:00 10 mL

## 2020-10-16 MED ORDER — CHLORHEXIDINE GLUCONATE CLOTH 2 % EX PADS
6.0000 | MEDICATED_PAD | Freq: Every day | CUTANEOUS | Status: DC
  Administered 2020-10-16 – 2020-10-22 (×7): 6 via TOPICAL

## 2020-10-16 NOTE — Progress Notes (Signed)
Peripherally Inserted Central Catheter Placement  The IV Nurse has discussed with the patient and/or persons authorized to consent for the patient, the purpose of this procedure and the potential benefits and risks involved with this procedure.  The benefits include less needle sticks, lab draws from the catheter, and the patient may be discharged home with the catheter. Risks include, but not limited to, infection, bleeding, blood clot (thrombus formation), and puncture of an artery; nerve damage and irregular heartbeat and possibility to perform a PICC exchange if needed/ordered by physician.  Alternatives to this procedure were also discussed.  Bard Power PICC patient education guide, fact sheet on infection prevention and patient information card has been provided to patient /or left at bedside.    PICC Placement Documentation  PICC Single Lumen 48/47/20 PICC Right Basilic 43 cm 0 cm (Active)  Exposed Catheter (cm) 0 cm 10/16/20 1020  Site Assessment Clean;Dry;Intact 10/16/20 1020  Line Status Blood return noted;Saline locked;Flushed 10/16/20 1020  Dressing Type Transparent;Securing device 10/16/20 1020  Dressing Status Clean;Dry;Intact 10/16/20 1020  Antimicrobial disc in place? Yes 10/16/20 1020  Safety Lock Not Applicable 72/18/28 8337  Dressing Change Due 10/23/20 10/16/20 1020       Charles [name] 10/16/2020, 10:23 AM

## 2020-10-16 NOTE — Progress Notes (Signed)
PROGRESS NOTE    Charles [name]  YHC:[id number] DOB: [birth year] DOA: 10/10/2020 PCP: Patient, No Pcp Per   Chief Complain: Shortness of breath  Brief Narrative: [name] a 54 year old male with history of hypertension, diabetes type 2, obesity who presented to the emergency department from ALF with complaints of shortness of breath.  He was recently suspected to have lymphoma due to finding of metastatic lymphadenopathy and was following with oncology and has been planned for biopsy by CT surgery. He follows with Dr. Lorenso Courier. On presentation he was tachycardic, hypoxic needing 2 L of oxygen per minute.  Chest x-ray showed possible pulmonary edema.  Also was endorsing shortness of breath. CT angiogram of the chest did not show any PE but showed worsening mediastinal lymphadenopathy. Echocardiogram showed severe reduction in ejection fraction of 20 to 25%,which is a new finding.    Assessment & Plan:   Acute congestive heart failure with reduced ejection fraction:  Echo showed ejection fraction of 20 to 25% was a new finding.  Cardiology was consulted.  Patient placed on IV diuretics.  Also noted to be on digoxin.  He was also started on Entresto and spironolactone.   Seems to be diuresing but continues to have shortness of breath.  Due to acute CHF he is currently not on beta-blockers.   Patient underwent cardiac catheterization on 12/7 which reveals normal coronary artery disease.  Reason for his cardiomyopathy is not entirely clear. Cardiology continues to follow.  Plan is to check cooximetry as patient may need inotropes.  Acute hypoxic respiratory failure Secondary to CHF exacerbation.  CT angiogram did not show any PE. He has been weaned down to room air.    Mediastinal lymphadenopathy Follows with oncology.  High suspicion for Hodgkin's lymphoma.  CT imaging showed progressive lymphadenopathy.  [name] supposed to follow-up with cardiothoracic surgery for biopsy of the lymph nodes.   Will need to wait till his cardiac status stabilizes.    Essential hypertension/sinus tachycardia Blood pressure remains reasonably well controlled.  Continue to monitor.  Noted to be tachycardic.  Telemetry shows sinus tachycardia.    Diabetes type 2 On Metformin at home.  Continue sliding scale insulin.  Hemoglobin A1c of 8.1.  CBGs noted to be elevated.  Started on Lantus yesterday.  CBGs remain poorly controlled.  Will increase the dose today.  Hyponatremia Most likely due to volume overload.  Monitor closely while he is getting IV diuretics.  Hypothyroidism Continue Synthyroid.  Mild elevated TSH level. Free t4 mildly elevated.  Results inconclusive. Repeat TFTs in 4 to 6 weeks.  BPH Takes Flomax  Hyperlipidemia Takes Lipitor at home.  Morbid obesity Estimated body mass index is 40.08 kg/m as calculated from the following:   Height as of this encounter: 5\' 9"  (1.753 m).   Weight as of this encounter: 123.1 kg.   DVT prophylaxis: Lovenox Code Status: Full Family Communication: None at bedside Disposition: Hopefully return back to ALF when improved.  Status is: Inpatient  Remains inpatient appropriate because:Inpatient level of care appropriate due to severity of illness   Dispo: The patient is from: Home( ALF)              Anticipated d/c is to: Home              Anticipated d/c date is: 1-2 days              Patient currently is not medically stable to d/c.     Consultants: Cardiology  Procedures: Cardiac catheterization 32/3  LV end diastolic pressure is severely elevated. LVEDP 29 mm Hg.  There is no aortic valve stenosis.  No angiographically apparent CAD.  Ao sat 98%, PA sat 69%, PA pressure 34/22, mean 26 mm Hg; mean PCWP 26 mm Hg; CO 4.96 L/min; CI 2.09    Antimicrobials:  Anti-infectives (From admission, onward)   None      Subjective: Patient continues to complain of shortness of breath and profound fatigue.  Denies any lightheadedness  or dizziness.  No nausea vomiting.    Objective: Vitals:   10/16/20 0000 10/16/20 0500 10/16/20 0736 10/16/20 0813  BP: 134/76  (!) 110/54   Pulse: (!) 102  (!) 120   Resp: 18  18   Temp: 98 F (36.7 C)  97.8 F (36.6 C)   TempSrc: Oral  Oral   SpO2: 98%  96% 95%  Weight:  123.1 kg    Height:        Intake/Output Summary (Last 24 hours) at 10/16/2020 0933 Last data filed at 10/16/2020 0831 Gross per 24 hour  Intake 200 ml  Output 2100 ml  Net -1900 ml   Filed Weights   10/13/20 1709 10/15/20 0456 10/16/20 0500  Weight: 124.9 kg 123.6 kg 123.1 kg    Examination:  General appearance: Awake alert.  In no distress Resp: Mildly tachypneic at rest.  Few crackles bilateral bases.  No wheezing or rhonchi.   Cardio: S1-S2 is normal regular.  No S3-S4.  No rubs murmurs or bruit GI: Abdomen is soft.  Nontender nondistended.  Bowel sounds are present normal.  No masses organomegaly Extremities: 1+ edema bilateral lower extremities.  Moving all his extremities  Neurologic: Alert and oriented x3.  No focal neurological deficits.      Data Reviewed: I have personally reviewed following labs and imaging studies  CBC: Recent Labs  Lab 10/10/20 2102 10/11/20 0839 10/12/20 0500 10/13/20 0453 10/14/20 1002 10/14/20 1019 10/15/20 0406  WBC 11.6* 10.4 10.0 9.8  --   --  12.3*  NEUTROABS 6.6 6.5 5.8 5.1  --   --  8.1*  HGB 15.1 16.2 16.5 16.1 18.0* 18.4* 15.6  HCT 46.7 49.1 50.7 49.9 53.0* 54.0* 48.4  MCV 77.4* 77.7* 76.6* 77.2*  --   --  76.8*  PLT 204 213 156 204  --   --  557   Basic Metabolic Panel: Recent Labs  Lab 10/12/20 0832 10/13/20 0453 10/14/20 0445 10/14/20 1002 10/14/20 1019 10/15/20 0406 10/16/20 0217  NA 134* 136 133* 135 136 133* 130*  K 4.0 3.9 4.0 3.8 4.0 4.1 4.1  CL 98 96* 96*  --   --  95* 91*  CO2 25 25 24   --   --  26 25  GLUCOSE 156* 170* 164*  --   --  178* 328*  BUN 20 22* 23*  --   --  22* 19  CREATININE 1.24 1.21 1.14  --   --  1.11 1.19   CALCIUM 9.3 9.2 9.2  --   --  9.4 9.5   GFR: Estimated Creatinine Clearance: 89.9 mL/min (by C-G formula based on SCr of 1.19 mg/dL).  Coagulation Profile: Recent Labs  Lab 10/10/20 2138  INR 1.0    CBG: Recent Labs  Lab 10/15/20 0735 10/15/20 1133 10/15/20 1602 10/15/20 2149 10/16/20 0733  GLUCAP 209* 216* 240* 294* 222*    Recent Results (from the past 240 hour(s))  Resp Panel by RT-PCR (Flu A&B, Covid) Nasopharyngeal  Swab     Status: None   Collection Time: 10/10/20  9:04 PM   Specimen: Nasopharyngeal Swab; Nasopharyngeal(NP) swabs in vial transport medium  Result Value Ref Range Status   SARS Coronavirus 2 by RT PCR NEGATIVE NEGATIVE Final    Comment: (NOTE) SARS-CoV-2 target nucleic acids are NOT DETECTED.  The SARS-CoV-2 RNA is generally detectable in upper respiratory specimens during the acute phase of infection. The lowest concentration of SARS-CoV-2 viral copies this assay can detect is 138 copies/mL. A negative result does not preclude SARS-Cov-2 infection and should not be used as the sole basis for treatment or other patient management decisions. A negative result may occur with  improper specimen collection/handling, submission of specimen other than nasopharyngeal swab, presence of viral mutation(s) within the areas targeted by this assay, and inadequate number of viral copies(<138 copies/mL). A negative result must be combined with clinical observations, patient history, and epidemiological information. The expected result is Negative.  Fact Sheet for Patients:  EntrepreneurPulse.com.au  Fact Sheet for Healthcare Providers:  IncredibleEmployment.be  This test is no t yet approved or cleared by the [place] FDA and  has been authorized for detection and/or diagnosis of SARS-CoV-2 by FDA under an Emergency Use Authorization (EUA). This EUA will remain  in effect (meaning this test can be used) for the  duration of the COVID-19 declaration under Section 564(b)(1) of the Act, 21 U.S.C.section 360bbb-3(b)(1), unless the authorization is terminated  or revoked sooner.       Influenza A by PCR NEGATIVE NEGATIVE Final   Influenza B by PCR NEGATIVE NEGATIVE Final    Comment: (NOTE) The Xpert Xpress SARS-CoV-2/FLU/RSV plus assay is intended as an aid in the diagnosis of influenza from Nasopharyngeal swab specimens and should not be used as a sole basis for treatment. Nasal washings and aspirates are unacceptable for Xpert Xpress SARS-CoV-2/FLU/RSV testing.  Fact Sheet for Patients: EntrepreneurPulse.com.au  Fact Sheet for Healthcare Providers: IncredibleEmployment.be  This test is not yet approved or cleared by the [place] FDA and has been authorized for detection and/or diagnosis of SARS-CoV-2 by FDA under an Emergency Use Authorization (EUA). This EUA will remain in effect (meaning this test can be used) for the duration of the COVID-19 declaration under Section 564(b)(1) of the Act, 21 U.S.C. section 360bbb-3(b)(1), unless the authorization is terminated or revoked.  Performed at [facility], [place] [address] [place], [place] [address]          Radiology Studies: CARDIAC CATHETERIZATION  Result Date: 95/12/8411  LV end diastolic pressure is severely elevated. LVEDP 29 mm Hg.  There is no aortic valve stenosis.  No angiographically apparent CAD.  Ao sat 98%, PA sat 69%, PA pressure 34/22, mean 26 mm Hg; mean PCWP 26 mm Hg; CO 4.96 L/min; CI 2.09  Continue medical therapy for nonischemic cardiomyopathy.   [place] EKG SITE RITE  Result Date: 10/16/2020 If Site Rite image not attached, placement could not be confirmed due to current cardiac rhythm.       Scheduled Meds: . atorvastatin  10 mg Oral Daily  . brimonidine  1 drop Both Eyes BID  . cyclobenzaprine  10 mg Oral TID  . digoxin  0.125 mg Oral Daily  .  enoxaparin (LOVENOX) injection  60 mg Subcutaneous Q24H  . feeding supplement  237 mL Oral BID BM  . furosemide  80 mg Intravenous Q12H  . insulin aspart  0-15 Units Subcutaneous TID WC  . insulin detemir  8 Units  Subcutaneous Daily  . latanoprost  1 drop Both Eyes QHS  . levothyroxine  100 mcg Oral Q0600  . melatonin  3 mg Oral QHS  . polyethylene glycol  17 g Oral BID  . risperiDONE  0.5 mg Oral QHS  . sacubitril-valsartan  1 tablet Oral BID  . senna  2 tablet Oral QHS  . sodium chloride flush  3 mL Intravenous Q12H  . sodium chloride flush  3 mL Intravenous Q12H  . spironolactone  25 mg Oral Daily  . tamsulosin  0.4 mg Oral Daily   Continuous Infusions: . sodium chloride       LOS: 5 days      Bonnielee Haff, MD [facility] P12/07/2020, 9:33 AM

## 2020-10-16 NOTE — Progress Notes (Addendum)
Progress Note  Patient Name: Charles [name] Date of Encounter: 10/16/2020  [facility] Cardiologist: [name], MD   Subjective   Pt denies CP; minimal dyspnea; positive cough  Inpatient Medications    Scheduled Meds: . atorvastatin  10 mg Oral Daily  . brimonidine  1 drop Both Eyes BID  . cyclobenzaprine  10 mg Oral TID  . digoxin  0.125 mg Oral Daily  . enoxaparin (LOVENOX) injection  60 mg Subcutaneous Q24H  . feeding supplement  237 mL Oral BID BM  . furosemide  80 mg Intravenous Q12H  . insulin aspart  0-15 Units Subcutaneous TID WC  . insulin detemir  8 Units Subcutaneous Daily  . latanoprost  1 drop Both Eyes QHS  . levothyroxine  100 mcg Oral Q0600  . melatonin  3 mg Oral QHS  . polyethylene glycol  17 g Oral BID  . risperiDONE  0.5 mg Oral QHS  . sacubitril-valsartan  1 tablet Oral BID  . senna  2 tablet Oral QHS  . sodium chloride flush  3 mL Intravenous Q12H  . sodium chloride flush  3 mL Intravenous Q12H  . spironolactone  25 mg Oral Daily  . tamsulosin  0.4 mg Oral Daily   Continuous Infusions: . sodium chloride     PRN Meds: sodium chloride, acetaminophen **OR** acetaminophen, alum & mag hydroxide-simeth, guaiFENesin-codeine, magnesium hydroxide, ondansetron **OR** [DISCONTINUED] ondansetron (ZOFRAN) IV, sodium chloride flush, sodium phosphate   Vital Signs    Vitals:   10/15/20 2244 10/16/20 0000 10/16/20 0500 10/16/20 0736  BP: 130/80 134/76  (!) 110/54  Pulse: 98 (!) 102  (!) 120  Resp: 18 18  18   Temp: 98.1 F (36.7 C) 98 F (36.7 C)  97.8 F (36.6 C)  TempSrc:  Oral  Oral  SpO2: 98% 98%  96%  Weight:   123.1 kg   Height:        Intake/Output Summary (Last 24 hours) at 10/16/2020 0747 Last data filed at 10/16/2020 0600 Gross per 24 hour  Intake 200 ml  Output 1825 ml  Net -1625 ml   Last 3 Weights 10/16/2020 10/15/2020 10/13/2020  Weight (lbs) 271 lb 6.2 oz 272 lb 7.8 oz 275 lb 4 oz  Weight (kg) 123.1 kg 123.6 kg 124.853  kg      Telemetry    Sinus Tach with PVCs- Personally Reviewed   Physical Exam   GEN: NAD Neck: supple, JVP difficult to assess Cardiac: RRR, Positive gallop; no murmur Respiratory: CTA GI: Soft, NT/ND MS: Trace edema Neuro:  Grossly intact Psych: Normal affect   Labs    High Sensitivity Troponin:   Recent Labs  Lab 10/11/20 0254 10/11/20 0521  TROPONINIHS 28* 25*      Chemistry Recent Labs  Lab 10/14/20 0445 10/14/20 1002 10/14/20 1019 10/15/20 0406 10/16/20 0217  NA 133*   < > 136 133* 130*  K 4.0   < > 4.0 4.1 4.1  CL 96*  --   --  95* 91*  CO2 24  --   --  26 25  GLUCOSE 164*  --   --  178* 328*  BUN 23*  --   --  22* 19  CREATININE 1.14  --   --  1.11 1.19  CALCIUM 9.2  --   --  9.4 9.5  GFRNONAA >60  --   --  >60 >60  ANIONGAP 13  --   --  12 14   < > =  values in this interval not displayed.     Hematology Recent Labs  Lab 10/12/20 0500 10/13/20 0453 10/14/20 1002 10/14/20 1019 10/15/20 0406  WBC 10.0 9.8  --   --  12.3*  RBC 6.62* 6.46*  --   --  6.30*  HGB 16.5 16.1 18.0* 18.4* 15.6  HCT 50.7 49.9 53.0* 54.0* 48.4  MCV 76.6* 77.2*  --   --  76.8*  MCH 24.9* 24.9*  --   --  24.8*  MCHC 32.5 32.3  --   --  32.2  RDW 19.7* 19.6*  --   --  18.8*  PLT 156 204  --   --  203    BNP Recent Labs  Lab 10/10/20 2102  BNP 97.4     Radiology    CARDIAC CATHETERIZATION  Result Date: 81/12/7515  LV end diastolic pressure is severely elevated. LVEDP 29 mm Hg.  There is no aortic valve stenosis.  No angiographically apparent CAD.  Ao sat 98%, PA sat 69%, PA pressure 34/22, mean 26 mm Hg; mean PCWP 26 mm Hg; CO 4.96 L/min; CI 2.09  Continue medical therapy for nonischemic cardiomyopathy.    Patient Profile     54 y.o. male with past medical history of hypertension, diabetes mellitus, hyperlipidemia, hypothyroidism, bipolar disorder, paranoid schizophrenia, tobacco abuse, possible newly diagnosed lymphoma for evaluation of acute systolic  congestive heart failure.  Echocardiogram this admission shows ejection fraction 20 to 25%, severe left ventricular enlargement.  Assessment & Plan    1 acute systolic congestive heart failure-I/O-1625; wt 123.1 kg.  Patient remains dyspneic.  Also tachycardic.  Continue Lasix and spironolactone at present dose.  Follow renal function.  Will place PICC line and check coox. May need inotrope.  2 nonischemic cardiomyopathy-etiology unclear.  Cardiac catheterization reveals no coronary disease.  No history of alcohol abuse by patient report.  Continue Entresto and digoxin.  Titrate medications as tolerated by blood pressure.  We will add carvedilol later once CHF improves. Plan to repeat echocardiogram 3 months after medications fully titrated to see if LV function has improved.  If not we will need to consider ICD.   3 hypertension-blood pressure is controlled on CHF medications.  4 hyperlipidemia-continue statin.  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 Further evaluation once cardiac status improves.  Management per primary care.  For questions or updates, please contact [place] Please consult [email or web address] for contact info under        Signed, Kirk Ruths, MD  10/16/2020, 7:47 AM

## 2020-10-17 LAB — BASIC METABOLIC PANEL
Anion gap: 14 (ref 5–15)
BUN: 22 mg/dL — ABNORMAL HIGH (ref 6–20)
CO2: 22 mmol/L (ref 22–32)
Calcium: 9.2 mg/dL (ref 8.9–10.3)
Chloride: 92 mmol/L — ABNORMAL LOW (ref 98–111)
Creatinine, Ser: 1.12 mg/dL (ref 0.61–1.24)
GFR, Estimated: >60 mL/min (ref >60)
Glucose, Bld: 373 mg/dL — ABNORMAL HIGH (ref 70–99)
Potassium: 4.6 mmol/L (ref 3.5–5.1)
Sodium: 128 mmol/L — ABNORMAL LOW (ref 135–145)

## 2020-10-17 LAB — COOXEMETRY PANEL
Carboxyhemoglobin: 0.9 % (ref 0.5–1.5)
Methemoglobin: 0.7 % (ref 0.0–1.5)
O2 Saturation: 56.2 %
Total hemoglobin: 15.3 g/dL (ref 12.0–16.0)

## 2020-10-17 LAB — GLUCOSE, CAPILLARY
Glucose-Capillary: 306 mg/dL — ABNORMAL HIGH (ref 70–99)
Glucose-Capillary: 299 mg/dL — ABNORMAL HIGH (ref 70–99)
Glucose-Capillary: 321 mg/dL — ABNORMAL HIGH (ref 70–99)
Glucose-Capillary: 372 mg/dL — ABNORMAL HIGH (ref 70–99)

## 2020-10-17 NOTE — Progress Notes (Signed)
Inpatient Diabetes Program Recommendations  AACE/ADA: New Consensus Statement on Inpatient Glycemic Control (2015)  Target Ranges:  Prepandial:   less than 140 mg/dL      Peak postprandial:   less than 180 mg/dL (1-2 hours)      Critically ill patients:  140 - 180 mg/dL   Lab Results  Component Value Date   GLUCAP 306 (H) 10/17/2020   HGBA1C 8.1 (H) 10/11/2020    Review of Glycemic Control  Diabetes history: DM2 Outpatient Diabetes medications: metformin 500 mg bid Current orders for Inpatient glycemic control: Levemir 14 units QD, Novolog 0-15 units tidwc  HgbA1C - 8.1% FBS - 306 mg/dL  Inpatient Diabetes Program Recommendations:     Increase Levemir to 17 units QD Add HS correction  Continue to follow glucose trends.   Thank you. Lorenda Peck, RD, LDN, CDE Inpatient Diabetes Coordinator [phone]

## 2020-10-17 NOTE — Progress Notes (Signed)
Physical Therapy Treatment Patient Details Name: Charles [name] MRN: [id number] DOB: 1965-12-22 Today's Date: 10/17/2020    History of Present Illness 54 yo M with PMH of HTN, diabetes, HLD, hypothyroidism, bipolar disorder, paranoid schizophrenia, and tobacco abuse. He presented to the ED on 10/10/20 with worsening shortness of breath and has been admitted for acute CHF. ECHO was done on 10/11/20 and LVEF was severely decreased to 20-25% Cardiac cath performed 12/7 revealed no CAD.    PT Comments    Pt was seen for progression of ex and gait, tolerated fairly well and is still expecting to go home from hosp.  Pt is hindered to walk by his lines and confined spaces with clutter in the room, but with minimal cues is navigating fairly well on the hall and in room.  Will continue to challenge with ex and gait obstacles, and will expect HHPT to follow up as needed for home care and safety.  He is continuing acutely for work on goals of PT.   Follow Up Recommendations  Home health PT;Supervision for mobility/OOB     Equipment Recommendations  None recommended by PT    Recommendations for Other Services       Precautions / Restrictions Precautions Precautions: Fall Precaution Comments: monitor telemetry Restrictions Weight Bearing Restrictions: No RUE Weight Bearing: Weight bearing as tolerated    Mobility  Bed Mobility               General bed mobility comments: received up in chair  Transfers Overall transfer level: Needs assistance Equipment used: Rolling walker (2 wheeled);1 person hand held assist Transfers: Sit to/from Stand Sit to Stand: Supervision            Ambulation/Gait Ambulation/Gait assistance: Min guard Gait Distance (Feet): 125 Feet Assistive device: Rolling walker (2 wheeled) Gait Pattern/deviations: Step-through pattern;Decreased stride length;Wide base of support Gait velocity: decreased Gait velocity interpretation: <1.31 ft/sec, indicative of  household ambulator General Gait Details: reminders for care of lines with turning   Stairs             Wheelchair Mobility    Modified Rankin (Stroke Patients Only)       Balance Overall balance assessment: Needs assistance Sitting-balance support: Feet supported Sitting balance-Leahy Scale: Good     Standing balance support: Bilateral upper extremity supported Standing balance-Leahy Scale: Fair                              Cognition Arousal/Alertness: Awake/alert Behavior During Therapy: WFL for tasks assessed/performed Overall Cognitive Status: Within Functional Limits for tasks assessed                                        Exercises General Exercises - Lower Extremity Ankle Circles/Pumps: AROM;AAROM;5 reps Quad Sets: AROM;10 reps Long Arc Quad: Strengthening;10 reps Heel Slides: Strengthening;10 reps Hip ABduction/ADduction: Strengthening;10 reps    General Comments General comments (skin integrity, edema, etc.): pt is up to walk on hallway, monitored his telemetry output with no extreme changes from baseline      Pertinent Vitals/Pain Pain Assessment: No/denies pain    Home Living                      Prior Function            PT Goals (current goals can  now be found in the care plan section) Acute Rehab PT Goals Patient Stated Goal: get better and get home Progress towards PT goals: Progressing toward goals    Frequency    Min 3X/week      PT Plan Current plan remains appropriate    Co-evaluation              AM-PAC PT "6 Clicks" Mobility   Outcome Measure  Help needed turning from your back to your side while in a flat bed without using bedrails?: None Help needed moving from lying on your back to sitting on the side of a flat bed without using bedrails?: None Help needed moving to and from a bed to a chair (including a wheelchair)?: None Help needed standing up from a chair using your  arms (e.g., wheelchair or bedside chair)?: None Help needed to walk in hospital room?: A Little Help needed climbing 3-5 steps with a railing? : A Little 6 Click Score: 22    End of Session Equipment Utilized During Treatment: Gait belt Activity Tolerance: Patient tolerated treatment well Patient left: in chair;with call bell/phone within reach;with chair alarm set Nurse Communication: Mobility status PT Visit Diagnosis: Unsteadiness on feet (R26.81);Difficulty in walking, not elsewhere classified (R26.2)     Time: 1121-6244 PT Time Calculation (min) (ACUTE ONLY): 28 min  Charges:  $Gait Training: 8-22 mins $Therapeutic Exercise: 8-22 mins                    Ramond Dial 10/17/2020, 1:55 PM  Mee Hives, PT MS Acute Rehab Dept. Number: [place] and [place]

## 2020-10-17 NOTE — Progress Notes (Signed)
PROGRESS NOTE    Charles [name]  AST:[id number] DOB: 1965/11/14 DOA: 10/10/2020 PCP: Patient, No Pcp Per   Chief Complain: Shortness of breath  Brief Narrative: [name] a 53 year old male with history of hypertension, diabetes type 2, obesity who presented to the emergency department from ALF with complaints of shortness of breath.  He was recently suspected to have lymphoma due to finding of metastatic lymphadenopathy and was following with oncology and has been planned for biopsy by CT surgery. He follows with Dr. Lorenso Courier. On presentation he was tachycardic, hypoxic needing 2 L of oxygen per minute.  Chest x-ray showed possible pulmonary edema.  Also was endorsing shortness of breath. CT angiogram of the chest did not show any PE but showed worsening mediastinal lymphadenopathy. Echocardiogram showed severe reduction in ejection fraction of 20 to 25%,which is a new finding.    Assessment & Plan:   Acute congestive heart failure with reduced ejection fraction/sinus tachycardia Echo showed ejection fraction of 20 to 25% which was a new finding.  Cardiology was consulted.  Patient placed on IV diuretics.  Also started on digoxin, Entresto and spironolactone.   Seems to have diuresed well over the last 48 hours.  Continue to monitor strict ins and outs and daily weights. Due to acute CHF he is currently not on beta-blockers.   Noted to have sinus tachycardia on telemetry. Patient underwent cardiac catheterization on 12/7 which reveals normal coronary artery disease.  Reason for his cardiomyopathy is not entirely clear. Cardiology continues to follow and manage  Acute hypoxic respiratory failure Secondary to CHF exacerbation.  CT angiogram did not show any PE. He has been weaned down to room air.    Mediastinal lymphadenopathy Follows with oncology.  High suspicion for Hodgkin's lymphoma.  CT imaging showed progressive lymphadenopathy.  [name] supposed to follow-up with cardiothoracic  surgery for biopsy of the lymph nodes.  Will need to wait till his cardiac status stabilizes.    Essential hypertension Blood pressure reasonably well controlled.  Continue to monitor.  Diabetes type 2 On Metformin at home.  Hemoglobin A1c of 8.1.  CBGs are noted to be poorly controlled.  Patient was started on Levemir.  Dose was increased yesterday.  May need to further titrate depending on her CBGs.  Hyponatremia Most likely due to volume overload.  Monitor closely while he is getting IV diuretics.  Sodium noted to be lower today at 128.  Recheck tomorrow.  Hypothyroidism Continue Synthyroid.  Mild elevated TSH level. Free t4 mildly elevated.  Results inconclusive. Repeat TFTs in 4 to 6 weeks.  BPH Takes Flomax  Hyperlipidemia Takes Lipitor at home.  Constipation Had a bowel movement on Wednesday.  [name] on scheduled laxatives stool softeners.  Also has enemas available as needed.  Morbid obesity Estimated body mass index is 40.73 kg/m as calculated from the following:   Height as of this encounter: 5\' 9"  (1.753 m).   Weight as of this encounter: 125.1 kg.   DVT prophylaxis: Lovenox Code Status: Full Family Communication: Discussed with the patient Disposition: Hopefully return back to ALF when improved.  Status is: Inpatient  Remains inpatient appropriate because:Inpatient level of care appropriate due to severity of illness   Dispo: The patient is from: Home( ALF)              Anticipated d/c is to: Home              Anticipated d/c date is: 1-2 days  Patient currently is not medically stable to d/c.     Consultants: Cardiology  Procedures: Cardiac catheterization 64/4  LV end diastolic pressure is severely elevated. LVEDP 29 mm Hg.  There is no aortic valve stenosis.  No angiographically apparent CAD.  Ao sat 98%, PA sat 69%, PA pressure 34/22, mean 26 mm Hg; mean PCWP 26 mm Hg; CO 4.96 L/min; CI 2.09    Antimicrobials:   Anti-infectives (From admission, onward)   None      Subjective: Patient continues to feel short of breath with minimal exertion and complains of fatigue.  Denies any chest pain.  No lightheadedness or dizziness.     Objective: Vitals:   10/16/20 1651 10/16/20 2115 10/17/20 0527 10/17/20 0844  BP: (!) 118/97 122/89 121/89 (!) 139/55  Pulse: (!) 123 (!) 118 (!) 121 (!) 121  Resp: 18 18 18 18   Temp: 98.1 F (36.7 C) 98 F (36.7 C) 98 F (36.7 C) 98 F (36.7 C)  TempSrc: Oral Oral Oral Oral  SpO2: 95%   90%  Weight:   125.1 kg   Height:        Intake/Output Summary (Last 24 hours) at 10/17/2020 1026 Last data filed at 10/17/2020 0932 Gross per 24 hour  Intake 882 ml  Output 2575 ml  Net -1693 ml   Filed Weights   10/15/20 0456 10/16/20 0500 10/17/20 0527  Weight: 123.6 kg 123.1 kg 125.1 kg    Examination:  General appearance: Awake alert.  In no distress Resp: Mildly tachypneic at rest.  Crackles bilateral bases.  No wheezing or rhonchi.   Cardio: S1-S2 is tachycardic regular.  No S3-S4.  No rubs murmurs or bruit.  Telemetry shows sinus tachycardia in 120s. GI: Abdomen is soft.  Nontender nondistended.  Bowel sounds are present normal.  No masses organomegaly Extremities: Edema bilateral lower extremities Neurologic: Alert and oriented x3.  No focal neurological deficits.       Data Reviewed: I have personally reviewed following labs and imaging studies  CBC: Recent Labs  Lab 10/10/20 2102 10/11/20 0839 10/12/20 0500 10/13/20 0453 10/14/20 1002 10/14/20 1019 10/15/20 0406  WBC 11.6* 10.4 10.0 9.8  --   --  12.3*  NEUTROABS 6.6 6.5 5.8 5.1  --   --  8.1*  HGB 15.1 16.2 16.5 16.1 18.0* 18.4* 15.6  HCT 46.7 49.1 50.7 49.9 53.0* 54.0* 48.4  MCV 77.4* 77.7* 76.6* 77.2*  --   --  76.8*  PLT 204 213 156 204  --   --  034   Basic Metabolic Panel: Recent Labs  Lab 10/13/20 0453 10/14/20 0445 10/14/20 1002 10/14/20 1019 10/15/20 0406 10/16/20 0217  10/17/20 0053  NA 136 133* 135 136 133* 130* 128*  K 3.9 4.0 3.8 4.0 4.1 4.1 4.6  CL 96* 96*  --   --  95* 91* 92*  CO2 25 24  --   --  26 25 22   GLUCOSE 170* 164*  --   --  178* 328* 373*  BUN 22* 23*  --   --  22* 19 22*  CREATININE 1.21 1.14  --   --  1.11 1.19 1.12  CALCIUM 9.2 9.2  --   --  9.4 9.5 9.2   GFR: Estimated Creatinine Clearance: 96.4 mL/min (by C-G formula based on SCr of 1.12 mg/dL).  Coagulation Profile: Recent Labs  Lab 10/10/20 2138  INR 1.0    CBG: Recent Labs  Lab 10/16/20 0733 10/16/20 1117 10/16/20 1648 10/16/20 2114  10/17/20 0859  GLUCAP 222* 231* 284* 290* 306*    Recent Results (from the past 240 hour(s))  Resp Panel by RT-PCR (Flu A&B, Covid) Nasopharyngeal Swab     Status: None   Collection Time: 10/10/20  9:04 PM   Specimen: Nasopharyngeal Swab; Nasopharyngeal(NP) swabs in vial transport medium  Result Value Ref Range Status   SARS Coronavirus 2 by RT PCR NEGATIVE NEGATIVE Final    Comment: (NOTE) SARS-CoV-2 target nucleic acids are NOT DETECTED.  The SARS-CoV-2 RNA is generally detectable in upper respiratory specimens during the acute phase of infection. The lowest concentration of SARS-CoV-2 viral copies this assay can detect is 138 copies/mL. A negative result does not preclude SARS-Cov-2 infection and should not be used as the sole basis for treatment or other patient management decisions. A negative result may occur with  improper specimen collection/handling, submission of specimen other than nasopharyngeal swab, presence of viral mutation(s) within the areas targeted by this assay, and inadequate number of viral copies(<138 copies/mL). A negative result must be combined with clinical observations, patient history, and epidemiological information. The expected result is Negative.  Fact Sheet for Patients:  EntrepreneurPulse.com.au  Fact Sheet for Healthcare Providers:   IncredibleEmployment.be  This test is no t yet approved or cleared by the [place] FDA and  has been authorized for detection and/or diagnosis of SARS-CoV-2 by FDA under an Emergency Use Authorization (EUA). This EUA will remain  in effect (meaning this test can be used) for the duration of the COVID-19 declaration under Section 564(b)(1) of the Act, 21 U.S.C.section 360bbb-3(b)(1), unless the authorization is terminated  or revoked sooner.       Influenza A by PCR NEGATIVE NEGATIVE Final   Influenza B by PCR NEGATIVE NEGATIVE Final    Comment: (NOTE) The Xpert Xpress SARS-CoV-2/FLU/RSV plus assay is intended as an aid in the diagnosis of influenza from Nasopharyngeal swab specimens and should not be used as a sole basis for treatment. Nasal washings and aspirates are unacceptable for Xpert Xpress SARS-CoV-2/FLU/RSV testing.  Fact Sheet for Patients: EntrepreneurPulse.com.au  Fact Sheet for Healthcare Providers: IncredibleEmployment.be  This test is not yet approved or cleared by the [place] FDA and has been authorized for detection and/or diagnosis of SARS-CoV-2 by FDA under an Emergency Use Authorization (EUA). This EUA will remain in effect (meaning this test can be used) for the duration of the COVID-19 declaration under Section 564(b)(1) of the Act, 21 U.S.C. section 360bbb-3(b)(1), unless the authorization is terminated or revoked.  Performed at [facility], [place] [address], [place], [place] [address]          Radiology Studies: [place] EKG SITE RITE  Result Date: 10/16/2020 If Site Rite image not attached, placement could not be confirmed due to current cardiac rhythm.       Scheduled Meds: . atorvastatin  10 mg Oral Daily  . brimonidine  1 drop Both Eyes BID  . Chlorhexidine Gluconate Cloth  6 each Topical Daily  . cyclobenzaprine  10 mg Oral TID  . digoxin  0.125 mg Oral  Daily  . enoxaparin (LOVENOX) injection  60 mg Subcutaneous Q24H  . feeding supplement  237 mL Oral BID BM  . furosemide  80 mg Intravenous Q12H  . insulin aspart  0-15 Units Subcutaneous TID WC  . insulin detemir  14 Units Subcutaneous Daily  . latanoprost  1 drop Both Eyes QHS  . levothyroxine  100 mcg Oral Q0600  . melatonin  3 mg  Oral QHS  . polyethylene glycol  17 g Oral BID  . risperiDONE  0.5 mg Oral QHS  . sacubitril-valsartan  1 tablet Oral BID  . senna  2 tablet Oral QHS  . sodium chloride flush  10-40 mL Intracatheter Q12H  . sodium chloride flush  3 mL Intravenous Q12H  . sodium chloride flush  3 mL Intravenous Q12H  . spironolactone  25 mg Oral Daily  . tamsulosin  0.4 mg Oral Daily   Continuous Infusions: . sodium chloride       LOS: 6 days      Bonnielee Haff, MD [facility] P12/08/2020, 10:26 AM

## 2020-10-17 NOTE — Progress Notes (Signed)
Progress Note  Patient Name: Charles [name] Date of Encounter: 10/17/2020  [facility] Cardiologist: [name], MD   Subjective   No CP; dyspnea earlier but now improved  Inpatient Medications    Scheduled Meds: . atorvastatin  10 mg Oral Daily  . brimonidine  1 drop Both Eyes BID  . Chlorhexidine Gluconate Cloth  6 each Topical Daily  . cyclobenzaprine  10 mg Oral TID  . digoxin  0.125 mg Oral Daily  . enoxaparin (LOVENOX) injection  60 mg Subcutaneous Q24H  . feeding supplement  237 mL Oral BID BM  . furosemide  80 mg Intravenous Q12H  . insulin aspart  0-15 Units Subcutaneous TID WC  . insulin detemir  14 Units Subcutaneous Daily  . latanoprost  1 drop Both Eyes QHS  . levothyroxine  100 mcg Oral Q0600  . melatonin  3 mg Oral QHS  . polyethylene glycol  17 g Oral BID  . risperiDONE  0.5 mg Oral QHS  . sacubitril-valsartan  1 tablet Oral BID  . senna  2 tablet Oral QHS  . sodium chloride flush  10-40 mL Intracatheter Q12H  . sodium chloride flush  3 mL Intravenous Q12H  . sodium chloride flush  3 mL Intravenous Q12H  . spironolactone  25 mg Oral Daily  . tamsulosin  0.4 mg Oral Daily   Continuous Infusions: . sodium chloride     PRN Meds: sodium chloride, acetaminophen **OR** acetaminophen, alum & mag hydroxide-simeth, guaiFENesin-codeine, magnesium hydroxide, ondansetron **OR** [DISCONTINUED] ondansetron (ZOFRAN) IV, sodium chloride flush, sodium chloride flush, sodium phosphate   Vital Signs    Vitals:   10/16/20 0813 10/16/20 1651 10/16/20 2115 10/17/20 0527  BP:  (!) 118/97 122/89 121/89  Pulse:  (!) 123 (!) 118 (!) 121  Resp:  18 18 18   Temp:  98.1 F (36.7 C) 98 F (36.7 C) 98 F (36.7 C)  TempSrc:  Oral Oral Oral  SpO2: 95% 95%    Weight:    125.1 kg  Height:        Intake/Output Summary (Last 24 hours) at 10/17/2020 0757 Last data filed at 10/17/2020 0600 Gross per 24 hour  Intake 300 ml  Output 2350 ml  Net -2050 ml   Last  3 Weights 10/17/2020 10/16/2020 10/15/2020  Weight (lbs) 275 lb 12.7 oz 271 lb 6.2 oz 272 lb 7.8 oz  Weight (kg) 125.1 kg 123.1 kg 123.6 kg      Telemetry    Sinus Tach with rare PVC- Personally Reviewed   Physical Exam   GEN: NAD, obese Neck: supple Cardiac: RRR, Positive gallop Respiratory: CTA; no wheeze GI: Soft, NT/ND, no masses MS: No edema Neuro:  No focal findings Psych: Normal affect   Labs    High Sensitivity Troponin:   Recent Labs  Lab 10/11/20 0254 10/11/20 0521  TROPONINIHS 28* 25*      Chemistry Recent Labs  Lab 10/15/20 0406 10/16/20 0217 10/17/20 0053  NA 133* 130* 128*  K 4.1 4.1 4.6  CL 95* 91* 92*  CO2 26 25 22   GLUCOSE 178* 328* 373*  BUN 22* 19 22*  CREATININE 1.11 1.19 1.12  CALCIUM 9.4 9.5 9.2  GFRNONAA >60 >60 >60  ANIONGAP 12 14 14      Hematology Recent Labs  Lab 10/12/20 0500 10/13/20 0453 10/14/20 1002 10/14/20 1019 10/15/20 0406  WBC 10.0 9.8  --   --  12.3*  RBC 6.62* 6.46*  --   --  6.30*  HGB 16.5 16.1 18.0* 18.4* 15.6  HCT 50.7 49.9 53.0* 54.0* 48.4  MCV 76.6* 77.2*  --   --  76.8*  MCH 24.9* 24.9*  --   --  24.8*  MCHC 32.5 32.3  --   --  32.2  RDW 19.7* 19.6*  --   --  18.8*  PLT 156 204  --   --  203    BNP Recent Labs  Lab 10/10/20 2102  BNP 97.4     Radiology    [place] EKG SITE RITE  Result Date: 10/16/2020 If Site Rite image not attached, placement could not be confirmed due to current cardiac rhythm.   Patient Profile     54 y.o. 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  Echocardiogram this admission shows ejection fraction 20 to 25%, severe left ventricular enlargement.  Assessment & Plan    1 acute systolic congestive heart failure-I/O-2050; wt 125.1 kg (? Accurate).  Coox 54.  Remains  mildly dyspneic this morning.  Continue Lasix and spironolactone at present dose.  Follow renal function.    2 nonischemic cardiomyopathy-etiology unclear.  Cardiac catheterization reveals no coronary disease.  No history of alcohol abuse by patient report.  Continue Entresto and digoxin.  Titrate medications as tolerated by blood pressure.  We will add carvedilol later once CHF improves. Plan to repeat echocardiogram 3 months after medications fully titrated to see if LV function has improved.  If not we will need to consider ICD.   3 hypertension-blood pressure is controlled on CHF medications.  4 hyperlipidemia-continue statin.  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 Further evaluation once cardiac status improves.  Management per primary care.  For questions or updates, please contact [place] Please consult [email or web address] for contact info under        Signed, Kirk Ruths, MD  10/17/2020, 7:57 AM

## 2020-10-18 LAB — BASIC METABOLIC PANEL
Anion gap: 11 (ref 5–15)
BUN: 21 mg/dL — ABNORMAL HIGH (ref 6–20)
CO2: 28 mmol/L (ref 22–32)
Calcium: 9.7 mg/dL (ref 8.9–10.3)
Chloride: 92 mmol/L — ABNORMAL LOW (ref 98–111)
Creatinine, Ser: 1.24 mg/dL (ref 0.61–1.24)
GFR, Estimated: >60 mL/min (ref >60)
Glucose, Bld: 235 mg/dL — ABNORMAL HIGH (ref 70–99)
Potassium: 4.9 mmol/L (ref 3.5–5.1)
Sodium: 131 mmol/L — ABNORMAL LOW (ref 135–145)

## 2020-10-18 LAB — GLUCOSE, CAPILLARY
Glucose-Capillary: 290 mg/dL — ABNORMAL HIGH (ref 70–99)
Glucose-Capillary: 298 mg/dL — ABNORMAL HIGH (ref 70–99)
Glucose-Capillary: 227 mg/dL — ABNORMAL HIGH (ref 70–99)
Glucose-Capillary: 317 mg/dL — ABNORMAL HIGH (ref 70–99)

## 2020-10-18 MED ORDER — FLEET ENEMA 7-19 GM/118ML RE ENEM
1.0000 | ENEMA | Freq: Once | RECTAL | Status: AC
  Administered 2020-10-18: 11:00:00 1 via RECTAL
  Filled 2020-10-18: qty 1

## 2020-10-18 MED ORDER — SENNA 8.6 MG PO TABS
2.0000 | ORAL_TABLET | Freq: Two times a day (BID) | ORAL | Status: DC
  Administered 2020-10-18 – 2020-10-23 (×8): 17.2 mg via ORAL
  Filled 2020-10-18 (×11): qty 2

## 2020-10-18 MED ORDER — CARVEDILOL 3.125 MG PO TABS
3.1250 mg | ORAL_TABLET | Freq: Two times a day (BID) | ORAL | Status: DC
  Administered 2020-10-18 – 2020-10-19 (×3): 3.125 mg via ORAL
  Filled 2020-10-18 (×3): qty 1

## 2020-10-18 MED ORDER — GLUCERNA SHAKE PO LIQD
237.0000 mL | Freq: Three times a day (TID) | ORAL | Status: DC
  Administered 2020-10-18 – 2020-10-22 (×13): 237 mL via ORAL
  Filled 2020-10-18 (×5): qty 237

## 2020-10-18 MED ORDER — INSULIN DETEMIR 100 UNIT/ML ~~LOC~~ SOLN
18.0000 [IU] | Freq: Every day | SUBCUTANEOUS | Status: DC
  Administered 2020-10-18 – 2020-10-19 (×2): 18 [IU] via SUBCUTANEOUS
  Filled 2020-10-18 (×2): qty 0.18

## 2020-10-18 MED ORDER — CARVEDILOL 3.125 MG PO TABS: 3.1250 mg | ORAL_TABLET | Freq: Two times a day (BID) | ORAL | Status: DC

## 2020-10-18 NOTE — Progress Notes (Signed)
Progress Note  Patient Name: Charles [name] Date of Encounter: 10/18/2020  [facility] Cardiologist: [name], MD   Subjective   Sitting in chair no distress but continued dyspnea and edema   Inpatient Medications    Scheduled Meds: . atorvastatin  10 mg Oral Daily  . brimonidine  1 drop Both Eyes BID  . Chlorhexidine Gluconate Cloth  6 each Topical Daily  . cyclobenzaprine  10 mg Oral TID  . digoxin  0.125 mg Oral Daily  . enoxaparin (LOVENOX) injection  60 mg Subcutaneous Q24H  . feeding supplement (GLUCERNA SHAKE)  237 mL Oral TID BM  . furosemide  80 mg Intravenous Q12H  . insulin aspart  0-15 Units Subcutaneous TID WC  . insulin detemir  14 Units Subcutaneous Daily  . latanoprost  1 drop Both Eyes QHS  . levothyroxine  100 mcg Oral Q0600  . melatonin  3 mg Oral QHS  . polyethylene glycol  17 g Oral BID  . risperiDONE  0.5 mg Oral QHS  . sacubitril-valsartan  1 tablet Oral BID  . senna  2 tablet Oral BID  . sodium chloride flush  10-40 mL Intracatheter Q12H  . sodium chloride flush  3 mL Intravenous Q12H  . sodium chloride flush  3 mL Intravenous Q12H  . sodium phosphate  1 enema Rectal Once  . spironolactone  25 mg Oral Daily  . tamsulosin  0.4 mg Oral Daily   Continuous Infusions: . sodium chloride     PRN Meds: sodium chloride, acetaminophen **OR** acetaminophen, alum & mag hydroxide-simeth, guaiFENesin-codeine, magnesium hydroxide, ondansetron **OR** [DISCONTINUED] ondansetron (ZOFRAN) IV, sodium chloride flush, sodium chloride flush, sodium phosphate   Vital Signs    Vitals:   10/17/20 1715 10/17/20 1932 10/18/20 0506 10/18/20 0823  BP: (!) 109/95 (!) 145/76 135/75   Pulse: (!) 125 (!) 123 (!) 117 64  Resp: 18 18 16 20   Temp: 98 F (36.7 C) 98.7 F (37.1 C) 98.6 F (37 C) (!) 97.5 F (36.4 C)  TempSrc: Oral Oral Oral Oral  SpO2: 95% 92% 93%   Weight:   125.6 kg   Height:        Intake/Output Summary (Last 24 hours) at 10/18/2020  0843 Last data filed at 10/18/2020 0640 Gross per 24 hour  Intake 1315 ml  Output 2950 ml  Net -1635 ml   Last 3 Weights 10/18/2020 10/17/2020 10/16/2020  Weight (lbs) 277 lb 275 lb 12.7 oz 271 lb 6.2 oz  Weight (kg) 125.646 kg 125.1 kg 123.1 kg      Telemetry    Sinus Tach with PVCs- Personally Reviewed   Physical Exam   Overweight black male Basilar atelectasis No murmur PMI enlarged Plus 2 LE edema  Cath sight A no hematoma   Labs    High Sensitivity Troponin:   Recent Labs  Lab 10/11/20 0254 10/11/20 0521  TROPONINIHS 28* 25*      Chemistry Recent Labs  Lab 10/15/20 0406 10/16/20 0217 10/17/20 0053  NA 133* 130* 128*  K 4.1 4.1 4.6  CL 95* 91* 92*  CO2 26 25 22   GLUCOSE 178* 328* 373*  BUN 22* 19 22*  CREATININE 1.11 1.19 1.12  CALCIUM 9.4 9.5 9.2  GFRNONAA >60 >60 >60  ANIONGAP 12 14 14      Hematology Recent Labs  Lab 10/12/20 0500 10/13/20 0453 10/14/20 1002 10/14/20 1019 10/15/20 0406  WBC 10.0 9.8  --   --  12.3*  RBC 6.62* 6.46*  --   --  6.30*  HGB 16.5 16.1 18.0* 18.4* 15.6  HCT 50.7 49.9 53.0* 54.0* 48.4  MCV 76.6* 77.2*  --   --  76.8*  MCH 24.9* 24.9*  --   --  24.8*  MCHC 32.5 32.3  --   --  32.2  RDW 19.7* 19.6*  --   --  18.8*  PLT 156 204  --   --  203    BNP No results for input(s): BNP, PROBNP in the last 168 hours.   Radiology    No results found.  Patient Profile     54 y.o. 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  Echocardiogram this admission shows ejection fraction 20 to 25%, severe left ventricular enlargement.  Assessment & Plan    1 CHF:  Acute systolic good diuresis but still volume overloaded no CAD at cath add coreg bid as he is too tachycardic   2 HLD -continue statin.  3  Mediastinal  adenopathy-concern for mediastinal lymphoma versus lung cancer. Further evaluation once cardiac status improves.  Management per primary care. He has f/u for oncology suspect he will need biopsy PET scan positive   For questions or updates, please contact [place] Please consult [email or web address] for contact info under        Signed, Jenkins Rouge, MD  10/18/2020, 8:43 AM

## 2020-10-18 NOTE — Progress Notes (Signed)
PROGRESS NOTE    Charles [name]  ZOX:[id number] DOB: 1966-05-26 DOA: 10/10/2020 PCP: Patient, No Pcp Per   Chief Complain: Shortness of breath  Brief Narrative: [name] a 54 year old male with history of hypertension, diabetes type 2, obesity who presented to the emergency department from ALF with complaints of shortness of breath.  He was recently suspected to have lymphoma due to finding of metastatic lymphadenopathy and was following with oncology and has been planned for biopsy by CT surgery. He follows with Dr. Lorenso Courier. On presentation he was tachycardic, hypoxic needing 2 L of oxygen per minute.  Chest x-ray showed possible pulmonary edema.  Also was endorsing shortness of breath. CT angiogram of the chest did not show any PE but showed worsening mediastinal lymphadenopathy. Echocardiogram showed severe reduction in ejection fraction of 20 to 25%,which is a new finding.    Assessment & Plan:   Acute congestive heart failure with reduced ejection fraction/sinus tachycardia Echo showed ejection fraction of 20 to 25% which was a new finding.  Cardiology was consulted.  Patient placed on IV diuretics.  Also started on digoxin, Entresto and spironolactone.   Seems to have diuresed well over the last 48 hours.  Continue to monitor strict ins and outs and daily weights. Due to acute CHF he is currently not on beta-blockers.   Patient underwent cardiac catheterization on 12/7 which reveals normal coronary artery disease.  Reason for his cardiomyopathy is not entirely clear. Cardiology continues to follow and manage.  Patient remains on IV diuretics. Continue to monitor strict ins and outs and daily weights. Telemetry continues to show sinus tachycardia.  Acute hypoxic respiratory failure Secondary to CHF exacerbation.  CT angiogram did not show any PE.  Appears to have resolved.  He has been weaned down to room air.    Mediastinal lymphadenopathy Follows with oncology.  High suspicion for  Hodgkin's lymphoma.  CT imaging showed progressive lymphadenopathy.  [name] supposed to follow-up with cardiothoracic surgery for biopsy of the lymph nodes.  Will need to wait till his cardiac status stabilizes.    Essential hypertension Blood pressure reasonably well controlled.  Continue to monitor.  Diabetes type 2 On Metformin at home.  Hemoglobin A1c of 8.1.  CBGs remain poorly controlled.  Patient attributes this to the Ensure.  Will change Ensure to Glucerna.  Continue current treatment with SSI and Levemir.  Will increase the dose of Levemir slightly today.   Hyponatremia Most likely due to volume overload.  Monitor closely while he is getting IV diuretics.  Labs are pending from today.  Hypothyroidism Continue Synthyroid.  Mild elevated TSH level. Free t4 mildly elevated.  Results inconclusive. Repeat TFTs in 4 to 6 weeks.  BPH Takes Flomax  Hyperlipidemia Takes Lipitor at home.  Constipation Had a bowel movement on Wednesday.  [name] on scheduled laxatives stool softeners.  Also has enemas available as needed.  Morbid obesity Estimated body mass index is 40.91 kg/m as calculated from the following:   Height as of this encounter: 5\' 9"  (1.753 m).   Weight as of this encounter: 125.6 kg.   DVT prophylaxis: Lovenox Code Status: Full Family Communication: Discussed with the patient Disposition: Hopefully return back to ALF when improved.  Status is: Inpatient  Remains inpatient appropriate because:Inpatient level of care appropriate due to severity of illness   Dispo: The patient is from: Home( ALF)              Anticipated d/c is to: Home  Anticipated d/c date is: 1-2 days              Patient currently is not medically stable to d/c.     Consultants: Cardiology  Procedures: Cardiac catheterization 14/4  LV end diastolic pressure is severely elevated. LVEDP 29 mm Hg.  There is no aortic valve stenosis.  No angiographically apparent  CAD.  Ao sat 98%, PA sat 69%, PA pressure 34/22, mean 26 mm Hg; mean PCWP 26 mm Hg; CO 4.96 L/min; CI 2.09    Antimicrobials:  Anti-infectives (From admission, onward)   None      Subjective: Patient continues to have difficulty breathing.  Occasional cough.  Denies any chest pain.  No nausea vomiting today.    Objective: Vitals:   10/17/20 1715 10/17/20 1932 10/18/20 0506 10/18/20 0823  BP: (!) 109/95 (!) 145/76 135/75   Pulse: (!) 125 (!) 123 (!) 117 64  Resp: 18 18 16 20   Temp: 98 F (36.7 C) 98.7 F (37.1 C) 98.6 F (37 C) (!) 97.5 F (36.4 C)  TempSrc: Oral Oral Oral Oral  SpO2: 95% 92% 93%   Weight:   125.6 kg   Height:        Intake/Output Summary (Last 24 hours) at 10/18/2020 1013 Last data filed at 10/18/2020 0640 Gross per 24 hour  Intake 955 ml  Output 2450 ml  Net -1495 ml   Filed Weights   10/16/20 0500 10/17/20 0527 10/18/20 0506  Weight: 123.1 kg 125.1 kg 125.6 kg    Examination:  General appearance: Awake alert.  In no distress Resp: Mildly tachypneic at rest.  Crackles bilateral bases.  No wheezing or rhonchi.   Cardio: S1-S2 is tachycardic regular.  No S3-S4.  No rubs murmurs or bruit.  Sinus tachycardia noted on telemetry GI: Abdomen is soft.  Nontender nondistended.  Bowel sounds are present normal.  No masses organomegaly Extremities: 1+ edema bilateral lower extremity Neurologic: Alert and oriented x3.  No focal neurological deficits.        Data Reviewed: I have personally reviewed following labs and imaging studies  CBC: Recent Labs  Lab 10/12/20 0500 10/13/20 0453 10/14/20 1002 10/14/20 1019 10/15/20 0406  WBC 10.0 9.8  --   --  12.3*  NEUTROABS 5.8 5.1  --   --  8.1*  HGB 16.5 16.1 18.0* 18.4* 15.6  HCT 50.7 49.9 53.0* 54.0* 48.4  MCV 76.6* 77.2*  --   --  76.8*  PLT 156 204  --   --  315   Basic Metabolic Panel: Recent Labs  Lab 10/13/20 0453 10/14/20 0445 10/14/20 1002 10/14/20 1019 10/15/20 0406  10/16/20 0217 10/17/20 0053  NA 136 133* 135 136 133* 130* 128*  K 3.9 4.0 3.8 4.0 4.1 4.1 4.6  CL 96* 96*  --   --  95* 91* 92*  CO2 25 24  --   --  26 25 22   GLUCOSE 170* 164*  --   --  178* 328* 373*  BUN 22* 23*  --   --  22* 19 22*  CREATININE 1.21 1.14  --   --  1.11 1.19 1.12  CALCIUM 9.2 9.2  --   --  9.4 9.5 9.2   GFR: Estimated Creatinine Clearance: 96.6 mL/min (by C-G formula based on SCr of 1.12 mg/dL).  CBG: Recent Labs  Lab 10/17/20 0859 10/17/20 1150 10/17/20 1716 10/17/20 2214 10/18/20 0730  GLUCAP 306* 299* 321* 372* 290*    Recent Results (from the  past 240 hour(s))  Resp Panel by RT-PCR (Flu A&B, Covid) Nasopharyngeal Swab     Status: None   Collection Time: 10/10/20  9:04 PM   Specimen: Nasopharyngeal Swab; Nasopharyngeal(NP) swabs in vial transport medium  Result Value Ref Range Status   SARS Coronavirus 2 by RT PCR NEGATIVE NEGATIVE Final    Comment: (NOTE) SARS-CoV-2 target nucleic acids are NOT DETECTED.  The SARS-CoV-2 RNA is generally detectable in upper respiratory specimens during the acute phase of infection. The lowest concentration of SARS-CoV-2 viral copies this assay can detect is 138 copies/mL. A negative result does not preclude SARS-Cov-2 infection and should not be used as the sole basis for treatment or other patient management decisions. A negative result may occur with  improper specimen collection/handling, submission of specimen other than nasopharyngeal swab, presence of viral mutation(s) within the areas targeted by this assay, and inadequate number of viral copies(<138 copies/mL). A negative result must be combined with clinical observations, patient history, and epidemiological information. The expected result is Negative.  Fact Sheet for Patients:  EntrepreneurPulse.com.au  Fact Sheet for Healthcare Providers:  IncredibleEmployment.be  This test is no t yet approved or cleared by  the [place] FDA and  has been authorized for detection and/or diagnosis of SARS-CoV-2 by FDA under an Emergency Use Authorization (EUA). This EUA will remain  in effect (meaning this test can be used) for the duration of the COVID-19 declaration under Section 564(b)(1) of the Act, 21 U.S.C.section 360bbb-3(b)(1), unless the authorization is terminated  or revoked sooner.       Influenza A by PCR NEGATIVE NEGATIVE Final   Influenza B by PCR NEGATIVE NEGATIVE Final    Comment: (NOTE) The Xpert Xpress SARS-CoV-2/FLU/RSV plus assay is intended as an aid in the diagnosis of influenza from Nasopharyngeal swab specimens and should not be used as a sole basis for treatment. Nasal washings and aspirates are unacceptable for Xpert Xpress SARS-CoV-2/FLU/RSV testing.  Fact Sheet for Patients: EntrepreneurPulse.com.au  Fact Sheet for Healthcare Providers: IncredibleEmployment.be  This test is not yet approved or cleared by the [place] FDA and has been authorized for detection and/or diagnosis of SARS-CoV-2 by FDA under an Emergency Use Authorization (EUA). This EUA will remain in effect (meaning this test can be used) for the duration of the COVID-19 declaration under Section 564(b)(1) of the Act, 21 U.S.C. section 360bbb-3(b)(1), unless the authorization is terminated or revoked.  Performed at [facility], [place] [address], [place], [place] [address]          Radiology Studies: No results found.      Scheduled Meds: . atorvastatin  10 mg Oral Daily  . brimonidine  1 drop Both Eyes BID  . carvedilol  3.125 mg Oral BID WC  . Chlorhexidine Gluconate Cloth  6 each Topical Daily  . cyclobenzaprine  10 mg Oral TID  . digoxin  0.125 mg Oral Daily  . enoxaparin (LOVENOX) injection  60 mg Subcutaneous Q24H  . feeding supplement (GLUCERNA SHAKE)  237 mL Oral TID BM  . furosemide  80 mg Intravenous Q12H  . insulin  aspart  0-15 Units Subcutaneous TID WC  . insulin detemir  14 Units Subcutaneous Daily  . latanoprost  1 drop Both Eyes QHS  . levothyroxine  100 mcg Oral Q0600  . melatonin  3 mg Oral QHS  . polyethylene glycol  17 g Oral BID  . risperiDONE  0.5 mg Oral QHS  . sacubitril-valsartan  1 tablet Oral BID  .  senna  2 tablet Oral BID  . sodium chloride flush  10-40 mL Intracatheter Q12H  . sodium chloride flush  3 mL Intravenous Q12H  . sodium chloride flush  3 mL Intravenous Q12H  . sodium phosphate  1 enema Rectal Once  . spironolactone  25 mg Oral Daily  . tamsulosin  0.4 mg Oral Daily   Continuous Infusions: . sodium chloride       LOS: 7 days      Bonnielee Haff, MD [facility] P12/09/2020, 10:13 AM

## 2020-10-18 NOTE — Progress Notes (Signed)
Patient's o2 sats 99-90% on RA while in bed.  2 L O2 [place] applied.  Sats increased to 92-95%

## 2020-10-19 LAB — GLUCOSE, CAPILLARY
Glucose-Capillary: 300 mg/dL — ABNORMAL HIGH (ref 70–99)
Glucose-Capillary: 243 mg/dL — ABNORMAL HIGH (ref 70–99)
Glucose-Capillary: 259 mg/dL — ABNORMAL HIGH (ref 70–99)
Glucose-Capillary: 217 mg/dL — ABNORMAL HIGH (ref 70–99)
Glucose-Capillary: 292 mg/dL — ABNORMAL HIGH (ref 70–99)

## 2020-10-19 LAB — BASIC METABOLIC PANEL
Anion gap: 12 (ref 5–15)
BUN: 24 mg/dL — ABNORMAL HIGH (ref 6–20)
CO2: 28 mmol/L (ref 22–32)
Calcium: 9.6 mg/dL (ref 8.9–10.3)
Chloride: 94 mmol/L — ABNORMAL LOW (ref 98–111)
Creatinine, Ser: 1.2 mg/dL (ref 0.61–1.24)
GFR, Estimated: >60 mL/min (ref >60)
Glucose, Bld: 239 mg/dL — ABNORMAL HIGH (ref 70–99)
Potassium: 4.6 mmol/L (ref 3.5–5.1)
Sodium: 134 mmol/L — ABNORMAL LOW (ref 135–145)

## 2020-10-19 LAB — CBC
HCT: 48.3 % (ref 39.0–52.0)
Hemoglobin: 15.6 g/dL (ref 13.0–17.0)
MCH: 25 pg — ABNORMAL LOW (ref 26.0–34.0)
MCHC: 32.3 g/dL (ref 30.0–36.0)
MCV: 77.4 fL — ABNORMAL LOW (ref 80.0–100.0)
Platelets: 207 10*3/uL (ref 150–400)
RBC: 6.24 MIL/uL — ABNORMAL HIGH (ref 4.22–5.81)
RDW: 18.6 % — ABNORMAL HIGH (ref 11.5–15.5)
WBC: 10.7 10*3/uL — ABNORMAL HIGH (ref 4.0–10.5)
nRBC: 0 % (ref 0.0–0.2)

## 2020-10-19 LAB — URINALYSIS, ROUTINE W REFLEX MICROSCOPIC
Bilirubin Urine: NEGATIVE
Glucose, UA: 150 mg/dL — AB
Hgb urine dipstick: NEGATIVE
Ketones, ur: NEGATIVE mg/dL
Leukocytes,Ua: NEGATIVE
Nitrite: NEGATIVE
Protein, ur: NEGATIVE mg/dL
Specific Gravity, Urine: 1.018 (ref 1.005–1.030)
pH: 5 (ref 5.0–8.0)

## 2020-10-19 MED ORDER — INSULIN DETEMIR 100 UNIT/ML ~~LOC~~ SOLN
24.0000 [IU] | Freq: Every day | SUBCUTANEOUS | Status: DC
  Filled 2020-10-19: qty 0.24

## 2020-10-19 MED ORDER — CARVEDILOL 6.25 MG PO TABS
6.2500 mg | ORAL_TABLET | Freq: Two times a day (BID) | ORAL | Status: DC
  Administered 2020-10-19 – 2020-10-22 (×6): 6.25 mg via ORAL
  Filled 2020-10-19 (×6): qty 1

## 2020-10-19 MED ORDER — INSULIN DETEMIR 100 UNIT/ML ~~LOC~~ SOLN
6.0000 [IU] | Freq: Once | SUBCUTANEOUS | Status: AC
  Administered 2020-10-19: 17:00:00 6 [IU] via SUBCUTANEOUS
  Filled 2020-10-19: qty 0.06

## 2020-10-19 NOTE — Progress Notes (Signed)
PROGRESS NOTE    AGAMJOT KILGALLON  SHF:[id number] DOB: 01/28/1966 DOA: 10/10/2020 PCP: Patient, No Pcp Per    Brief Narrative: [name] a 54 year old male with history of hypertension, diabetes type 2, obesity who presented to the emergency department from ALF with complaints of shortness of breath.  He was recently suspected to have lymphoma due to finding of metastatic lymphadenopathy and was following with oncology and has been planned for biopsy by CT surgery. He follows with Dr. Lorenso Courier. On presentation he was tachycardic, hypoxic needing 2 L of oxygen per minute.  Chest x-ray showed possible pulmonary edema.  Also was endorsing shortness of breath. CT angiogram of the chest did not show any PE but showed worsening mediastinal lymphadenopathy. Echocardiogram showed severe reduction in ejection fraction of 20 to 25%,which is a new finding.    Assessment & Plan:   Acute congestive heart failure with reduced ejection fraction/sinus tachycardia Echo showed ejection fraction of 20 to 25% which was a new finding.  Cardiology was consulted.  Patient placed on IV diuretics.  Also started on digoxin, Entresto and spironolactone.   Patient underwent cardiac catheterization on 12/7 which reveals normal coronary artery disease.  Reason for his cardiomyopathy is not entirely clear. Cardiology continues to follow and manage.  Remains on IV diuretics.  Was started on carvedilol yesterday with improvement in his heart rate.  Continue to monitor strict ins and outs and daily weights. Some concern for lethargy this morning from nursing staff.  Lately.  His vital signs are stable.  Avoid sedative agents.  Mobilize.  Acute hypoxic respiratory failure Secondary to CHF exacerbation.  CT angiogram did not show any PE.  Requiring 1 to 2 L of oxygen at times.  Mediastinal lymphadenopathy Follows with oncology.  High suspicion for Hodgkin's lymphoma.  CT imaging showed progressive lymphadenopathy.  [name]  supposed to follow-up with cardiothoracic surgery for biopsy of the lymph nodes.  Will need to wait till his cardiac status stabilizes.    Essential hypertension Blood pressure reasonably well controlled.  Continue to monitor.  Diabetes type 2 On Metformin at home.  Hemoglobin A1c of 8.1.  CBGs remain poorly controlled.  Yesterday his insurance was changed over 2+ with no significant change in glucose levels.  We will increase the dose of Levemir again.    Hyponatremia Most likely due to volume overload.  Monitor closely while he is getting IV diuretics.  Sodium levels have improved.  Continue to monitor.  Hypothyroidism Continue Synthyroid.  Mild elevated TSH level. Free t4 mildly elevated.  Results inconclusive. Repeat TFTs in 4 to 6 weeks.  BPH Takes Flomax  Hyperlipidemia Takes Lipitor at home.  Constipation Had bowel movement yesterday with enema.  Continue laxatives stool softeners.  Also has enemas available as needed.  Morbid obesity Estimated body mass index is 40.73 kg/m as calculated from the following:   Height as of this encounter: 5\' 9"  (1.753 m).   Weight as of this encounter: 125.1 kg.   DVT prophylaxis: Lovenox Code Status: Full Family Communication: Discussed with the patient Disposition: Hopefully return back to ALF when improved.  Status is: Inpatient  Remains inpatient appropriate because:Inpatient level of care appropriate due to severity of illness   Dispo: The patient is from: Home( ALF)              Anticipated d/c is to: Home              Anticipated d/c date is: 1-2 days  Patient currently is not medically stable to d/c.     Consultants: Cardiology  Procedures: Cardiac catheterization 25/0  LV end diastolic pressure is severely elevated. LVEDP 29 mm Hg.  There is no aortic valve stenosis.  No angiographically apparent CAD.  Ao sat 98%, PA sat 69%, PA pressure 34/22, mean 26 mm Hg; mean PCWP 26 mm Hg; CO 4.96 L/min; CI  2.09    Antimicrobials:  Anti-infectives (From admission, onward)   None      Subjective: Continues to feel fatigued.  No chest pain.  Shortness of breath is present.  No nausea vomiting.  Had a bowel movement after he was given enema yesterday.  Objective: Vitals:   10/18/20 2323 10/19/20 0538 10/19/20 0729 10/19/20 0858  BP: 129/89 117/70 (!) 119/94 116/80  Pulse: (!) 114 (!) 109 (!) 113 (!) 118  Resp: 20 20 20    Temp: 98.2 F (36.8 C) 97.9 F (36.6 C) 98.2 F (36.8 C)   TempSrc: Oral Oral Oral   SpO2: 92% 96% 95%   Weight:  125.1 kg    Height:        Intake/Output Summary (Last 24 hours) at 10/19/2020 1056 Last data filed at 10/19/2020 0649 Gross per 24 hour  Intake 370 ml  Output 1600 ml  Net -1230 ml   Filed Weights   10/17/20 0527 10/18/20 0506 10/19/20 0538  Weight: 125.1 kg 125.6 kg 125.1 kg    Examination:  General appearance: Awake alert.  In no distress Resp: Mildly tachypneic at rest.  Crackles bilateral bases.  No wheezing or rhonchi. Cardio: S1-S2 is tachycardic regular.  No S3-S4.  No rubs murmurs or bruit GI: Abdomen is soft.  Nontender nondistended.  Bowel sounds are present normal.  No masses organomegaly Extremities: 1+ edema bilateral lower extremity Neurologic: Alert and oriented x3.  No focal neurological deficits.      Data Reviewed: I have personally reviewed following labs and imaging studies  CBC: Recent Labs  Lab 10/13/20 0453 10/14/20 1002 10/14/20 1019 10/15/20 0406 10/19/20 0523  WBC 9.8  --   --  12.3* 10.7*  NEUTROABS 5.1  --   --  8.1*  --   HGB 16.1 18.0* 18.4* 15.6 15.6  HCT 49.9 53.0* 54.0* 48.4 48.3  MCV 77.2*  --   --  76.8* 77.4*  PLT 204  --   --  203 539   Basic Metabolic Panel: Recent Labs  Lab 10/15/20 0406 10/16/20 0217 10/17/20 0053 10/18/20 0500 10/19/20 0523  NA 133* 130* 128* 131* 134*  K 4.1 4.1 4.6 4.9 4.6  CL 95* 91* 92* 92* 94*  CO2 26 25 22 28 28   GLUCOSE 178* 328* 373* 235* 239*   BUN 22* 19 22* 21* 24*  CREATININE 1.11 1.19 1.12 1.24 1.20  CALCIUM 9.4 9.5 9.2 9.7 9.6   GFR: Estimated Creatinine Clearance: 89.9 mL/min (by C-G formula based on SCr of 1.2 mg/dL).  CBG: Recent Labs  Lab 10/18/20 0730 10/18/20 1139 10/18/20 1719 10/18/20 2151 10/19/20 0727  GLUCAP 290* 298* 227* 317* 300*    Recent Results (from the past 240 hour(s))  Resp Panel by RT-PCR (Flu A&B, Covid) Nasopharyngeal Swab     Status: None   Collection Time: 10/10/20  9:04 PM   Specimen: Nasopharyngeal Swab; Nasopharyngeal(NP) swabs in vial transport medium  Result Value Ref Range Status   SARS Coronavirus 2 by RT PCR NEGATIVE NEGATIVE Final    Comment: (NOTE) SARS-CoV-2 target nucleic acids are NOT DETECTED.  The SARS-CoV-2 RNA is generally detectable in upper respiratory specimens during the acute phase of infection. The lowest concentration of SARS-CoV-2 viral copies this assay can detect is 138 copies/mL. A negative result does not preclude SARS-Cov-2 infection and should not be used as the sole basis for treatment or other patient management decisions. A negative result may occur with  improper specimen collection/handling, submission of specimen other than nasopharyngeal swab, presence of viral mutation(s) within the areas targeted by this assay, and inadequate number of viral copies(<138 copies/mL). A negative result must be combined with clinical observations, patient history, and epidemiological information. The expected result is Negative.  Fact Sheet for Patients:  EntrepreneurPulse.com.au  Fact Sheet for Healthcare Providers:  IncredibleEmployment.be  This test is no t yet approved or cleared by the [place] FDA and  has been authorized for detection and/or diagnosis of SARS-CoV-2 by FDA under an Emergency Use Authorization (EUA). This EUA will remain  in effect (meaning this test can be used) for the duration of the COVID-19  declaration under Section 564(b)(1) of the Act, 21 U.S.C.section 360bbb-3(b)(1), unless the authorization is terminated  or revoked sooner.       Influenza A by PCR NEGATIVE NEGATIVE Final   Influenza B by PCR NEGATIVE NEGATIVE Final    Comment: (NOTE) The Xpert Xpress SARS-CoV-2/FLU/RSV plus assay is intended as an aid in the diagnosis of influenza from Nasopharyngeal swab specimens and should not be used as a sole basis for treatment. Nasal washings and aspirates are unacceptable for Xpert Xpress SARS-CoV-2/FLU/RSV testing.  Fact Sheet for Patients: EntrepreneurPulse.com.au  Fact Sheet for Healthcare Providers: IncredibleEmployment.be  This test is not yet approved or cleared by the [place] FDA and has been authorized for detection and/or diagnosis of SARS-CoV-2 by FDA under an Emergency Use Authorization (EUA). This EUA will remain in effect (meaning this test can be used) for the duration of the COVID-19 declaration under Section 564(b)(1) of the Act, 21 U.S.C. section 360bbb-3(b)(1), unless the authorization is terminated or revoked.  Performed at [facility], [place] [address], [place], [place] [address]          Radiology Studies: No results found.      Scheduled Meds: . atorvastatin  10 mg Oral Daily  . brimonidine  1 drop Both Eyes BID  . carvedilol  3.125 mg Oral BID WC  . Chlorhexidine Gluconate Cloth  6 each Topical Daily  . cyclobenzaprine  10 mg Oral TID  . digoxin  0.125 mg Oral Daily  . enoxaparin (LOVENOX) injection  60 mg Subcutaneous Q24H  . feeding supplement (GLUCERNA SHAKE)  237 mL Oral TID BM  . furosemide  80 mg Intravenous Q12H  . insulin aspart  0-15 Units Subcutaneous TID WC  . insulin detemir  18 Units Subcutaneous Daily  . latanoprost  1 drop Both Eyes QHS  . levothyroxine  100 mcg Oral Q0600  . melatonin  3 mg Oral QHS  . polyethylene glycol  17 g Oral BID  .  risperiDONE  0.5 mg Oral QHS  . sacubitril-valsartan  1 tablet Oral BID  . senna  2 tablet Oral BID  . sodium chloride flush  10-40 mL Intracatheter Q12H  . sodium chloride flush  3 mL Intravenous Q12H  . sodium chloride flush  3 mL Intravenous Q12H  . spironolactone  25 mg Oral Daily  . tamsulosin  0.4 mg Oral Daily   Continuous Infusions: . sodium chloride       LOS:  8 days      Bonnielee Haff, MD [facility] P12/10/2020, 10:56 AM

## 2020-10-19 NOTE — Progress Notes (Signed)
Patient with complaints of burning when urinating. Orders received for UA.

## 2020-10-19 NOTE — Progress Notes (Signed)
This am, patient seemed to be more lethargic and every attempt to wake him was followed by "I feel like I can't get woke up." Charles Roch, RN (in orientation) called Dr. [place] Pink to make him aware. I told the patient we had to try and get oob and eat something. I gave him a bath, got him to the chair with 1 person assist and sat him up to eat. Patient became more alert. Will continue to monitor. MD felt it to be related to his heart failure.

## 2020-10-19 NOTE — Progress Notes (Addendum)
Progress Note  Patient Name: Charles [name] Date of Encounter: 10/19/2020  [facility] Cardiologist: [name], MD   Subjective   No complaints diuresing welll OOB to chair still tachycardic   Inpatient Medications    Scheduled Meds:  atorvastatin  10 mg Oral Daily   brimonidine  1 drop Both Eyes BID   carvedilol  3.125 mg Oral BID WC   Chlorhexidine Gluconate Cloth  6 each Topical Daily   cyclobenzaprine  10 mg Oral TID   digoxin  0.125 mg Oral Daily   enoxaparin (LOVENOX) injection  60 mg Subcutaneous Q24H   feeding supplement (GLUCERNA SHAKE)  237 mL Oral TID BM   furosemide  80 mg Intravenous Q12H   insulin aspart  0-15 Units Subcutaneous TID WC   insulin detemir  18 Units Subcutaneous Daily   latanoprost  1 drop Both Eyes QHS   levothyroxine  100 mcg Oral Q0600   melatonin  3 mg Oral QHS   polyethylene glycol  17 g Oral BID   risperiDONE  0.5 mg Oral QHS   sacubitril-valsartan  1 tablet Oral BID   senna  2 tablet Oral BID   sodium chloride flush  10-40 mL Intracatheter Q12H   sodium chloride flush  3 mL Intravenous Q12H   sodium chloride flush  3 mL Intravenous Q12H   spironolactone  25 mg Oral Daily   tamsulosin  0.4 mg Oral Daily   Continuous Infusions:  sodium chloride     PRN Meds: sodium chloride, acetaminophen **OR** acetaminophen, alum & mag hydroxide-simeth, guaiFENesin-codeine, magnesium hydroxide, ondansetron **OR** [DISCONTINUED] ondansetron (ZOFRAN) IV, sodium chloride flush, sodium chloride flush, sodium phosphate   Vital Signs    Vitals:   10/18/20 2323 10/19/20 0538 10/19/20 0729 10/19/20 0858  BP: 129/89 117/70 (!) 119/94 116/80  Pulse: (!) 114 (!) 109 (!) 113 (!) 118  Resp: 20 20 20    Temp: 98.2 F (36.8 C) 97.9 F (36.6 C) 98.2 F (36.8 C)   TempSrc: Oral Oral Oral   SpO2: 92% 96% 95%   Weight:  125.1 kg    Height:        Intake/Output Summary (Last 24 hours) at 10/19/2020 1100 Last data filed  at 10/19/2020 0649 Gross per 24 hour  Intake 370 ml  Output 1600 ml  Net -1230 ml   Last 3 Weights 10/19/2020 10/18/2020 10/17/2020  Weight (lbs) 275 lb 12.7 oz 277 lb 275 lb 12.7 oz  Weight (kg) 125.1 kg 125.646 kg 125.1 kg      Telemetry    Sinus tachycardia: 10/19/2020    Physical Exam   Overweight black male Lungs clear today  No murmur PMI enlarged Plus 2 LE edema  Cath sight A no hematoma   Labs    High Sensitivity Troponin:   Recent Labs  Lab 10/11/20 0254 10/11/20 0521  TROPONINIHS 28* 25*      Chemistry Recent Labs  Lab 10/17/20 0053 10/18/20 0500 10/19/20 0523  NA 128* 131* 134*  K 4.6 4.9 4.6  CL 92* 92* 94*  CO2 22 28 28   GLUCOSE 373* 235* 239*  BUN 22* 21* 24*  CREATININE 1.12 1.24 1.20  CALCIUM 9.2 9.7 9.6  GFRNONAA >60 >60 >60  ANIONGAP 14 11 12      Hematology Recent Labs  Lab 10/13/20 0453 10/14/20 1002 10/14/20 1019 10/15/20 0406 10/19/20 0523  WBC 9.8  --   --  12.3* 10.7*  RBC 6.46*  --   --  6.30*  6.24*  HGB 16.1   < > 18.4* 15.6 15.6  HCT 49.9   < > 54.0* 48.4 48.3  MCV 77.2*  --   --  76.8* 77.4*  MCH 24.9*  --   --  24.8* 25.0*  MCHC 32.3  --   --  32.2 32.3  RDW 19.6*  --   --  18.8* 18.6*  PLT 204  --   --  203 207   < > = values in this interval not displayed.    BNP No results for input(s): BNP, PROBNP in the last 168 hours.   Radiology    No results found.  Patient Profile     54 y.o. 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  Echocardiogram this admission shows ejection fraction 20 to 25%, severe left ventricular enlargement.  Assessment & Plan    1 CHF:  Acute systolic good diuresis but still volume overloaded no CAD at cath PCWP 26 mmHg Coreg added yesterday Will increase dose today. Had another  1.2 L out yesterday continue iv diuresis ECG today documents sinus tachycardia and  Not arrhythmia Continue oral aldactone and digoxin   2 HLD -continue statin.  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 Further evaluation once cardiac status improves.  Management per primary care. He has f/u for oncology suspect he will need biopsy PET scan positive   For questions or updates, please contact [place] Please consult [email or web address] for contact info under        Signed, Jenkins Rouge, MD  10/19/2020, 11:00 AM

## 2020-10-20 DIAGNOSIS — I10 Essential (primary) hypertension: Secondary | ICD-10-CM

## 2020-10-20 LAB — BASIC METABOLIC PANEL
Anion gap: 12 (ref 5–15)
BUN: 24 mg/dL — ABNORMAL HIGH (ref 6–20)
CO2: 26 mmol/L (ref 22–32)
Calcium: 9.2 mg/dL (ref 8.9–10.3)
Chloride: 92 mmol/L — ABNORMAL LOW (ref 98–111)
Creatinine, Ser: 1.18 mg/dL (ref 0.61–1.24)
GFR, Estimated: >60 mL/min (ref >60)
Glucose, Bld: 263 mg/dL — ABNORMAL HIGH (ref 70–99)
Potassium: 4.7 mmol/L (ref 3.5–5.1)
Sodium: 130 mmol/L — ABNORMAL LOW (ref 135–145)

## 2020-10-20 LAB — GLUCOSE, CAPILLARY
Glucose-Capillary: 305 mg/dL — ABNORMAL HIGH (ref 70–99)
Glucose-Capillary: 291 mg/dL — ABNORMAL HIGH (ref 70–99)
Glucose-Capillary: 198 mg/dL — ABNORMAL HIGH (ref 70–99)
Glucose-Capillary: 275 mg/dL — ABNORMAL HIGH (ref 70–99)

## 2020-10-20 MED ORDER — INSULIN DETEMIR 100 UNIT/ML ~~LOC~~ SOLN
28.0000 [IU] | Freq: Every day | SUBCUTANEOUS | Status: DC
  Administered 2020-10-20 – 2020-10-23 (×4): 28 [IU] via SUBCUTANEOUS
  Filled 2020-10-20 (×4): qty 0.28

## 2020-10-20 NOTE — Progress Notes (Signed)
PROGRESS NOTE    Charles [name]  BLT:[id number] DOB: 1966/04/26 DOA: 10/10/2020 PCP: Patient, No Pcp Per    Brief Narrative: [name] a 54 year old male with history of hypertension, diabetes type 2, obesity who presented to the emergency department from ALF with complaints of shortness of breath.  He was recently suspected to have lymphoma due to finding of metastatic lymphadenopathy and was following with oncology and has been planned for biopsy by CT surgery. He follows with Dr. Lorenso Courier. On presentation he was tachycardic, hypoxic needing 2 L of oxygen per minute.  Chest x-ray showed possible pulmonary edema.  Also was endorsing shortness of breath. CT angiogram of the chest did not show any PE but showed worsening mediastinal lymphadenopathy. Echocardiogram showed severe reduction in ejection fraction of 20 to 25%,which is a new finding.    Assessment & Plan:   Acute congestive heart failure with reduced ejection fraction/sinus tachycardia Echo showed ejection fraction of 20 to 25%.  Cardiology was consulted.  Patient placed on IV diuretics.  Also started on digoxin, Entresto and spironolactone.  Subsequently also started on carvedilol. Patient underwent cardiac catheterization on 12/7 which reveals normal coronary artery disease.  Reason for his cardiomyopathy is not entirely clear. Cardiology continues to follow and manage.  Patient remains on IV diuretics.  Was noted to have significant sinus tachycardia which has improved since patient started on carvedilol.  It appears that he now has prior authorization approval for [place].  Will defer to cardiology to initiate this drug. Continue to monitor strict ins and outs and daily weights. Mobilize as tolerated.  Patient complained of some discomfort with urination yesterday.  UA did not suggest any infection.  Communicated to the patient.  Acute hypoxic respiratory failure Secondary to CHF exacerbation.  CT angiogram did not show any PE.   Requiring 1 to 2 L of oxygen at times.  Mediastinal lymphadenopathy Follows with oncology.  High suspicion for Hodgkin's lymphoma.  CT imaging showed progressive lymphadenopathy.  [name] supposed to follow-up with cardiothoracic surgery for biopsy of the lymph nodes.  Will need to wait till his cardiac status stabilizes.    Essential hypertension Blood pressure reasonably well controlled.  Continue to monitor.  Diabetes type 2 On Metformin at home.  Hemoglobin A1c of 8.1.  CBGs remain poorly controlled.  Continue to titrate Levemir.    Hyponatremia Most likely due to volume overload.  Monitor closely while he is getting IV diuretics.    Hypothyroidism Continue Synthyroid.  Mild elevated TSH level. Free t4 mildly elevated.  Results inconclusive. Repeat TFTs in 4 to 6 weeks.  BPH Takes Flomax  Hyperlipidemia Takes Lipitor at home.  Constipation Appears to have resolved with enema and laxatives which will be continued.  Also on stool softeners   Morbid obesity Estimated body mass index is 40.64 kg/m as calculated from the following:   Height as of this encounter: 5\' 9"  (1.753 m).   Weight as of this encounter: 124.8 kg.   DVT prophylaxis: Lovenox Code Status: Full Family Communication: Discussed with the patient Disposition: Hopefully return back to ALF when improved.  Status is: Inpatient  Remains inpatient appropriate because:Inpatient level of care appropriate due to severity of illness   Dispo: The patient is from: Home( ALF)              Anticipated d/c is to: Home              Anticipated d/c date is: 1-2 days  Patient currently is not medically stable to d/c.     Consultants: Cardiology  Procedures: Cardiac catheterization 37/6  LV end diastolic pressure is severely elevated. LVEDP 29 mm Hg.  There is no aortic valve stenosis.  No angiographically apparent CAD.  Ao sat 98%, PA sat 69%, PA pressure 34/22, mean 26 mm Hg; mean PCWP 26 mm  Hg; CO 4.96 L/min; CI 2.09    Antimicrobials:  Anti-infectives (From admission, onward)   None      Subjective: Patient mentions that overall he feels better compared to how he was few days ago.  Still has some shortness of breath with minimal exertion.  Denies any chest pain.  No nausea vomiting.  Fatigue appears to be resolving.  Objective: Vitals:   10/20/20 0043 10/20/20 0338 10/20/20 0814 10/20/20 0852  BP: 123/61 115/68 116/88 111/79  Pulse: (!) 109 (!) 106 (!) 121 (!) 117  Resp: 20 20  18   Temp: 97.7 F (36.5 C) 97.7 F (36.5 C) 98 F (36.7 C) 98.2 F (36.8 C)  TempSrc: Oral Oral  Oral  SpO2: 95% 95%    Weight:  124.8 kg    Height:        Intake/Output Summary (Last 24 hours) at 10/20/2020 0951 Last data filed at 10/20/2020 0600 Gross per 24 hour  Intake 462 ml  Output 2675 ml  Net -2213 ml   Filed Weights   10/18/20 0506 10/19/20 0538 10/20/20 0338  Weight: 125.6 kg 125.1 kg 124.8 kg    Examination:  General appearance: Awake alert.  In no distress Resp: Normal effort at rest.  Crackles bilateral bases.   Cardio: S1-S2 is tachycardic regular.  No S3-S4.  No rubs murmurs or bruit GI: Abdomen is soft.  Nontender nondistended.  Bowel sounds are present normal.  No masses organomegaly Extremities: 1+ pitting edema bilateral lower extremities Neurologic: Alert and oriented x3.  No focal neurological deficits.       Data Reviewed: I have personally reviewed following labs and imaging studies  CBC: Recent Labs  Lab 10/14/20 1002 10/14/20 1019 10/15/20 0406 10/19/20 0523  WBC  --   --  12.3* 10.7*  NEUTROABS  --   --  8.1*  --   HGB 18.0* 18.4* 15.6 15.6  HCT 53.0* 54.0* 48.4 48.3  MCV  --   --  76.8* 77.4*  PLT  --   --  203 283   Basic Metabolic Panel: Recent Labs  Lab 10/16/20 0217 10/17/20 0053 10/18/20 0500 10/19/20 0523 10/20/20 0500  NA 130* 128* 131* 134* 130*  K 4.1 4.6 4.9 4.6 4.7  CL 91* 92* 92* 94* 92*  CO2 25 22 28 28 26    GLUCOSE 328* 373* 235* 239* 263*  BUN 19 22* 21* 24* 24*  CREATININE 1.19 1.12 1.24 1.20 1.18  CALCIUM 9.5 9.2 9.7 9.6 9.2   GFR: Estimated Creatinine Clearance: 91.3 mL/min (by C-G formula based on SCr of 1.18 mg/dL).  CBG: Recent Labs  Lab 10/19/20 0727 10/19/20 1132 10/19/20 1609 10/19/20 1747 10/19/20 2021  GLUCAP 300* 243* 259* 217* 292*    Recent Results (from the past 240 hour(s))  Resp Panel by RT-PCR (Flu A&B, Covid) Nasopharyngeal Swab     Status: None   Collection Time: 10/10/20  9:04 PM   Specimen: Nasopharyngeal Swab; Nasopharyngeal(NP) swabs in vial transport medium  Result Value Ref Range Status   SARS Coronavirus 2 by RT PCR NEGATIVE NEGATIVE Final    Comment: (NOTE) SARS-CoV-2 target nucleic  acids are NOT DETECTED.  The SARS-CoV-2 RNA is generally detectable in upper respiratory specimens during the acute phase of infection. The lowest concentration of SARS-CoV-2 viral copies this assay can detect is 138 copies/mL. A negative result does not preclude SARS-Cov-2 infection and should not be used as the sole basis for treatment or other patient management decisions. A negative result may occur with  improper specimen collection/handling, submission of specimen other than nasopharyngeal swab, presence of viral mutation(s) within the areas targeted by this assay, and inadequate number of viral copies(<138 copies/mL). A negative result must be combined with clinical observations, patient history, and epidemiological information. The expected result is Negative.  Fact Sheet for Patients:  EntrepreneurPulse.com.au  Fact Sheet for Healthcare Providers:  IncredibleEmployment.be  This test is no t yet approved or cleared by the [place] FDA and  has been authorized for detection and/or diagnosis of SARS-CoV-2 by FDA under an Emergency Use Authorization (EUA). This EUA will remain  in effect (meaning this test can be  used) for the duration of the COVID-19 declaration under Section 564(b)(1) of the Act, 21 U.S.C.section 360bbb-3(b)(1), unless the authorization is terminated  or revoked sooner.       Influenza A by PCR NEGATIVE NEGATIVE Final   Influenza B by PCR NEGATIVE NEGATIVE Final    Comment: (NOTE) The Xpert Xpress SARS-CoV-2/FLU/RSV plus assay is intended as an aid in the diagnosis of influenza from Nasopharyngeal swab specimens and should not be used as a sole basis for treatment. Nasal washings and aspirates are unacceptable for Xpert Xpress SARS-CoV-2/FLU/RSV testing.  Fact Sheet for Patients: EntrepreneurPulse.com.au  Fact Sheet for Healthcare Providers: IncredibleEmployment.be  This test is not yet approved or cleared by the [place] FDA and has been authorized for detection and/or diagnosis of SARS-CoV-2 by FDA under an Emergency Use Authorization (EUA). This EUA will remain in effect (meaning this test can be used) for the duration of the COVID-19 declaration under Section 564(b)(1) of the Act, 21 U.S.C. section 360bbb-3(b)(1), unless the authorization is terminated or revoked.  Performed at [facility], [place] [address] [place], [place] [address]          Radiology Studies: No results found.      Scheduled Meds:  atorvastatin  10 mg Oral Daily   brimonidine  1 drop Both Eyes BID   carvedilol  6.25 mg Oral BID WC   Chlorhexidine Gluconate Cloth  6 each Topical Daily   cyclobenzaprine  10 mg Oral TID   digoxin  0.125 mg Oral Daily   enoxaparin (LOVENOX) injection  60 mg Subcutaneous Q24H   feeding supplement (GLUCERNA SHAKE)  237 mL Oral TID BM   furosemide  80 mg Intravenous Q12H   insulin aspart  0-15 Units Subcutaneous TID WC   insulin detemir  24 Units Subcutaneous Daily   latanoprost  1 drop Both Eyes QHS   levothyroxine  100 mcg Oral Q0600   melatonin  3 mg Oral QHS    polyethylene glycol  17 g Oral BID   risperiDONE  0.5 mg Oral QHS   sacubitril-valsartan  1 tablet Oral BID   senna  2 tablet Oral BID   sodium chloride flush  10-40 mL Intracatheter Q12H   sodium chloride flush  3 mL Intravenous Q12H   sodium chloride flush  3 mL Intravenous Q12H   spironolactone  25 mg Oral Daily   tamsulosin  0.4 mg Oral Daily   Continuous Infusions:  sodium chloride  LOS: 9 days      Bonnielee Haff, MD [facility] P12/13/2021, 9:51 AM

## 2020-10-20 NOTE — Progress Notes (Signed)
Progress Note  Patient Name: Charles [name] Date of Encounter: 10/20/2020  [facility] Cardiologist: [name], MD   Subjective   Diuresed another 2.2L negative overnight.  BUN/Creatinine stable. Sodium and chloride are trending down.  Inpatient Medications    Scheduled Meds: . atorvastatin  10 mg Oral Daily  . brimonidine  1 drop Both Eyes BID  . carvedilol  6.25 mg Oral BID WC  . Chlorhexidine Gluconate Cloth  6 each Topical Daily  . cyclobenzaprine  10 mg Oral TID  . digoxin  0.125 mg Oral Daily  . enoxaparin (LOVENOX) injection  60 mg Subcutaneous Q24H  . feeding supplement (GLUCERNA SHAKE)  237 mL Oral TID BM  . furosemide  80 mg Intravenous Q12H  . insulin aspart  0-15 Units Subcutaneous TID WC  . insulin detemir  24 Units Subcutaneous Daily  . latanoprost  1 drop Both Eyes QHS  . levothyroxine  100 mcg Oral Q0600  . melatonin  3 mg Oral QHS  . polyethylene glycol  17 g Oral BID  . risperiDONE  0.5 mg Oral QHS  . sacubitril-valsartan  1 tablet Oral BID  . senna  2 tablet Oral BID  . sodium chloride flush  10-40 mL Intracatheter Q12H  . sodium chloride flush  3 mL Intravenous Q12H  . sodium chloride flush  3 mL Intravenous Q12H  . spironolactone  25 mg Oral Daily  . tamsulosin  0.4 mg Oral Daily   Continuous Infusions: . sodium chloride     PRN Meds: sodium chloride, acetaminophen **OR** acetaminophen, alum & mag hydroxide-simeth, guaiFENesin-codeine, magnesium hydroxide, ondansetron **OR** [DISCONTINUED] ondansetron (ZOFRAN) IV, sodium chloride flush, sodium chloride flush, sodium phosphate   Vital Signs    Vitals:   10/19/20 2011 10/20/20 0043 10/20/20 0338 10/20/20 0814  BP: 100/86 123/61 115/68 116/88  Pulse: (!) 110 (!) 109 (!) 106 (!) 121  Resp: 20 20 20    Temp: 98.8 F (37.1 C) 97.7 F (36.5 C) 97.7 F (36.5 C) 98 F (36.7 C)  TempSrc: Oral Oral Oral   SpO2: 97% 95% 95%   Weight:   124.8 kg   Height:        Intake/Output  Summary (Last 24 hours) at 10/20/2020 0839 Last data filed at 10/20/2020 0600 Gross per 24 hour  Intake 462 ml  Output 2675 ml  Net -2213 ml   Last 3 Weights 10/20/2020 10/19/2020 10/18/2020  Weight (lbs) 275 lb 3.2 oz 275 lb 12.7 oz 277 lb  Weight (kg) 124.83 kg 125.1 kg 125.646 kg      Telemetry    Sinus tachycardia - personally reviewed  Physical Exam   General appearance: alert and no distress Neck: JVD - a few cm above sternal notch, no carotid bruit and thyroid not enlarged, symmetric, no tenderness/mass/nodules Lungs: diminished breath sounds bibasilar Heart: regular tachycardia\ Abdomen: soft, non-tender; bowel sounds normal; no masses,  no organomegaly and obese Extremities: edema 1-2+ firm bilateral LE edema Pulses: 2+ and symmetric Skin: Skin color, texture, turgor normal. No rashes or lesions Neurologic: Grossly normal Psych: Pleasant   Labs    High Sensitivity Troponin:   Recent Labs  Lab 10/11/20 0254 10/11/20 0521  TROPONINIHS 28* 25*      Chemistry Recent Labs  Lab 10/18/20 0500 10/19/20 0523 10/20/20 0500  NA 131* 134* 130*  K 4.9 4.6 4.7  CL 92* 94* 92*  CO2 28 28 26   GLUCOSE 235* 239* 263*  BUN 21* 24* 24*  CREATININE 1.24  1.20 1.18  CALCIUM 9.7 9.6 9.2  GFRNONAA >60 >60 >60  ANIONGAP 11 12 12      Hematology Recent Labs  Lab 10/14/20 1019 10/15/20 0406 10/19/20 0523  WBC  --  12.3* 10.7*  RBC  --  6.30* 6.24*  HGB 18.4* 15.6 15.6  HCT 54.0* 48.4 48.3  MCV  --  76.8* 77.4*  MCH  --  24.8* 25.0*  MCHC  --  32.2 32.3  RDW  --  18.8* 18.6*  PLT  --  203 207    BNP No results for input(s): BNP, PROBNP in the last 168 hours.   Radiology    No results found.  Patient Profile     54 y.o. male with past medical history of hypertension, diabetes mellitus, hyperlipidemia, hypothyroidism, bipolar disorder, paranoid schizophrenia, tobacco abuse, possible newly diagnosed lymphoma for evaluation of acute systolic congestive heart  failure.  Echocardiogram this admission shows ejection fraction 20 to 25%, severe left ventricular enlargement.  Assessment & Plan    1 CHF:  Acute systolic good diuresis but still volume overloaded no CAD at cath PCWP 26 mmHg - coreg recently increased. Another 2.2 L out yesterday continue iv diuresis ECG today documents sinus tachycardia and  Not arrhythmia Continue oral aldactone and digoxin. Noted to have some progressive hyponatremia - drinks only 4-5 cups of water/day. Monitor in the setting of diuresis.  2 HLD -continue statin.  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 Further evaluation once cardiac status improves.  Management per primary care. He has f/u for oncology suspect he will need biopsy PET scan positive   For questions or updates, please contact [place] Please consult [email or web address] for contact info under   Pixie Casino, MD, FACC, [place] Director of the [facility]  [facility] Diplomate of the American Board of Clinical Lipidology Attending Cardiologist  Direct Dial: [phone]  Fax: [phone]  Website:  [email or web address]  Pixie Casino, MD  10/20/2020, 8:39 AM

## 2020-10-20 NOTE — Progress Notes (Signed)
Inpatient Diabetes Program Recommendations  AACE/ADA: New Consensus Statement on Inpatient Glycemic Control (2015)  Target Ranges:  Prepandial:   less than 140 mg/dL      Peak postprandial:   less than 180 mg/dL (1-2 hours)      Critically ill patients:  140 - 180 mg/dL   Lab Results  Component Value Date   GLUCAP 305 (H) 10/20/2020   HGBA1C 8.1 (H) 10/11/2020    Review of Glycemic Control Results for Charles [name], Charles [name] (MRN [id number]) as of 10/20/2020 10:39  Ref. Range 10/19/2020 20:21 10/20/2020 09:57  Glucose-Capillary Latest Ref Range: 70 - 99 mg/dL 292 (H) 305 (H)   Diabetes history:  T2DM Outpatient Diabetes medications:  Metformin 500 mg BID Current orders for Inpatient glycemic control:  Levemir 28 units daily (increased today) Novolog 0-15 units TID  Inpatient Diabetes Program Recommendations:     Levemir increased today to 28 units daily.  Would also recommend adding Novolog 0-5 units QHS  Will continue to follow while inpatient.  Thank you, Reche Dixon, RN, BSN Diabetes Coordinator Inpatient Diabetes Program [phone] (team pager from 8a-5p)

## 2020-10-20 NOTE — Progress Notes (Signed)
Physical Therapy Treatment Patient Details Name: Charles [name] MRN: [id number] DOB: September 08, 1966 Today's Date: 10/20/2020    History of Present Illness 54 yo M with PMH of HTN, diabetes, HLD, hypothyroidism, bipolar disorder, paranoid schizophrenia, and tobacco abuse. He presented to the ED on 10/10/20 with worsening shortness of breath and has been admitted for acute CHF. ECHO was done on 10/11/20 and LVEF was severely decreased to 20-25% Cardiac cath performed 12/7 revealed no CAD.    PT Comments    Pt found in recliner. Pt requiring increased coaxing to participate in session due to requesting meds from RN. Pt agreeable to exercises. Pt able to perform with verbal cueing and able to perform sit<>stand x 10 reps with S from therapist with no AD. Pt with increase HR during sit<>stand RN present in room. Pt continues to demonstrate poor endurance and balance and will benefit from skilled PT to address deficits to maximize independence with functional mobility prior to discharge.     Follow Up Recommendations  Home health PT;Supervision for mobility/OOB     Equipment Recommendations  None recommended by PT    Recommendations for Other Services       Precautions / Restrictions Precautions Precautions: Fall Precaution Comments: monitor telemetry Restrictions Weight Bearing Restrictions: No RUE Weight Bearing: Weight bearing as tolerated    Mobility  Bed Mobility                  Transfers Overall transfer level: Needs assistance Equipment used: None Transfers: Sit to/from Stand Sit to Stand: Supervision         General transfer comment: performed 10 reps with S  Ambulation/Gait                 Stairs             Wheelchair Mobility    Modified Rankin (Stroke Patients Only)       Balance Overall balance assessment: Needs assistance Sitting-balance support: Feet supported Sitting balance-Leahy Scale: Good     Standing balance support: No upper  extremity supported Standing balance-Leahy Scale: Fair                              Cognition Arousal/Alertness: Awake/alert Behavior During Therapy: WFL for tasks assessed/performed Overall Cognitive Status: Within Functional Limits for tasks assessed                                        Exercises General Exercises - Lower Extremity Ankle Circles/Pumps: AROM;Strengthening;Both;20 reps;Seated Long Arc Quad: AROM;Strengthening;Both;20 reps;Seated Hip Flexion/Marching: AROM;Strengthening;Both;10 reps;Seated    General Comments        Pertinent Vitals/Pain Pain Location: slight discomfort in lower abdomen during seated marches    Home Living                      Prior Function            PT Goals (current goals can now be found in the care plan section) Acute Rehab PT Goals Patient Stated Goal: get better and get home PT Goal Formulation: With patient Time For Goal Achievement: 10/29/20 Potential to Achieve Goals: Good Progress towards PT goals: Progressing toward goals    Frequency    Min 3X/week      PT Plan Current plan remains appropriate    [name]-evaluation  AM-PAC PT "6 Clicks" Mobility   Outcome Measure  Help needed turning from your back to your side while in a flat bed without using bedrails?: None Help needed moving from lying on your back to sitting on the side of a flat bed without using bedrails?: None Help needed moving to and from a bed to a chair (including a wheelchair)?: A Little Help needed standing up from a chair using your arms (e.g., wheelchair or bedside chair)?: A Little Help needed to walk in hospital room?: A Little Help needed climbing 3-5 steps with a railing? : A Little 6 Click Score: 20    End of Session Equipment Utilized During Treatment: Gait belt Activity Tolerance: Patient tolerated treatment well Patient left: in chair;with call bell/phone within reach;with  nursing/sitter in room Nurse Communication: Mobility status PT Visit Diagnosis: Unsteadiness on feet (R26.81);Difficulty in walking, not elsewhere classified (R26.2)     Time: 3668-1594 PT Time Calculation (min) (ACUTE ONLY): 10 min  Charges:  $Therapeutic Exercise: 8-22 mins                     Charles [name], DPT Acute Rehabilitation Services [phone]   Charles [name] 10/20/2020, 10:53 AM

## 2020-10-20 NOTE — Progress Notes (Signed)
Heart Failure Stewardship Pharmacist Progress Note   PCP: Patient, No Pcp Per PCP-Cardiologist: [name], MD    HPI:  54 yo M with PMH of HTN, diabetes, HLD, hypothyroidism, bipolar disorder, paranoid schizophrenia, and tobacco abuse. He presented to the ED on 10/10/20 with worsening shortness of breath and has been admitted for acute CHF. ECHO was done on 10/11/20 and LVEF was severely decreased to 20-25%. R/LHC done on 10/14/20 with no apparent CAD, PAP 26, PCWP 26, CO 4.96, CI 2.09.  Current HF Medications: Furosemide 80 mg IV q12h Carvedilol 6.25 mg BID Entresto 24/26 mg BID Spironolactone 25 mg daily Digoxin 0.125 mg daily  Prior to admission HF Medications: Metoprolol tartrate 25 mg BID Lisinopril 2.5 mg daily  Pertinent Lab Values: . Serum creatinine 1.18, BUN 24, Potassium 4.7, Sodium 130, BNP 97, Digoxin level ordered for AM  Vital Signs: . Weight: 275 lbs (admission weight: 281 lbs) . Blood pressure: 110/70s  . Heart rate: 100-120s   Medication Assistance / Insurance Benefits Check: Does the patient have prescription insurance?  Yes Type of insurance plan: [facility] Medicaid  Outpatient Pharmacy:  [facility] Is the patient willing to use [place] at discharge? Yes Is the patient willing to transition their outpatient pharmacy to utilize a [facility] outpatient pharmacy?   Pending    Assessment: 1. Acute systolic CHF (EF 40-76%), due to NICM. NYHA class II/III symptoms. - Continue furosemide to 80 mg IV q12h - Continue carvedilol 6.25 mg BID - Continue Entresto 24/26 mg BID - Continue spironolactone to 25 mg daily - Consider starting Farxiga 10 mg daily prior to discharge - Continue digoxin 0.125 mg daily. Digoxin level ordered for tomorrow AM - Noted progressive hyponatremia, could consider tolvaptan if Na < 125.   Plan: 1) Medication changes recommended at this time: - Start Farxiga 10 mg daily  2)  Patient assistance application(s): - None pending, has Medicaid - Copays $3 and less - Prior authorization for Wilder Glade has been approved  3)  Education  - To be completed prior to discharge  Kerby Nora, PharmD, BCPS Heart Failure Cytogeneticist Phone [phone]

## 2020-10-20 NOTE — TOC Benefit Eligibility Note (Signed)
Patient Advocate Encounter  Prior Authorization for Farxiga 10 mg tablets has been approved.    PA# [id number] Effective dates: 10/18/2020 through 04/18/2021  Patients co-pay is $0.00.     Lyndel Safe, CPhT Certified Pharmacy Technician- Transitions of [place] [facility] Stewardship Team Direct Number: [phone]  Fax: [phone]

## 2020-10-21 ENCOUNTER — Inpatient Hospital Stay (HOSPITAL_COMMUNITY): Payer: Medicaid Other

## 2020-10-21 DIAGNOSIS — R140 Abdominal distension (gaseous): Secondary | ICD-10-CM

## 2020-10-21 LAB — BASIC METABOLIC PANEL
Anion gap: 10 (ref 5–15)
Anion gap: 16 — ABNORMAL HIGH (ref 5–15)
BUN: 22 mg/dL — ABNORMAL HIGH (ref 6–20)
BUN: 27 mg/dL — ABNORMAL HIGH (ref 6–20)
CO2: 26 mmol/L (ref 22–32)
CO2: 25 mmol/L (ref 22–32)
Calcium: 8.6 mg/dL — ABNORMAL LOW (ref 8.9–10.3)
Calcium: 9.7 mg/dL (ref 8.9–10.3)
Chloride: 86 mmol/L — ABNORMAL LOW (ref 98–111)
Chloride: 90 mmol/L — ABNORMAL LOW (ref 98–111)
Creatinine, Ser: 1.21 mg/dL (ref 0.61–1.24)
Creatinine, Ser: 1.26 mg/dL — ABNORMAL HIGH (ref 0.61–1.24)
GFR, Estimated: >60 mL/min (ref >60)
GFR, Estimated: >60 mL/min (ref >60)
Glucose, Bld: 219 mg/dL — ABNORMAL HIGH (ref 70–99)
Glucose, Bld: 246 mg/dL — ABNORMAL HIGH (ref 70–99)
Potassium: 4.7 mmol/L (ref 3.5–5.1)
Potassium: 4.9 mmol/L (ref 3.5–5.1)
Sodium: 122 mmol/L — ABNORMAL LOW (ref 135–145)
Sodium: 131 mmol/L — ABNORMAL LOW (ref 135–145)

## 2020-10-21 LAB — GLUCOSE, CAPILLARY
Glucose-Capillary: 217 mg/dL — ABNORMAL HIGH (ref 70–99)
Glucose-Capillary: 276 mg/dL — ABNORMAL HIGH (ref 70–99)
Glucose-Capillary: 183 mg/dL — ABNORMAL HIGH (ref 70–99)
Glucose-Capillary: 259 mg/dL — ABNORMAL HIGH (ref 70–99)

## 2020-10-21 LAB — DIGOXIN LEVEL: Digoxin Level: 0.4 ng/mL — ABNORMAL LOW (ref 0.8–2.0)

## 2020-10-21 NOTE — Progress Notes (Addendum)
PROGRESS NOTE    Charles [name]  MVH:[id number] DOB: [birth year] DOA: 10/10/2020 PCP: Patient, No Pcp Per    Brief Narrative: [name] a 54 year old male with history of hypertension, diabetes type 2, obesity who presented to the emergency department from ALF with complaints of shortness of breath.  He was recently suspected to have lymphoma due to finding of metastatic lymphadenopathy and was following with oncology and has been planned for biopsy by CT surgery. He follows with Dr. Lorenso Courier. On presentation he was tachycardic, hypoxic needing 2 L of oxygen per minute.  Chest x-ray showed possible pulmonary edema.  Also was endorsing shortness of breath. CT angiogram of the chest did not show any PE but showed worsening mediastinal lymphadenopathy. Echocardiogram showed severe reduction in ejection fraction of 20 to 25%,which is a new finding.    Assessment & Plan:   Acute congestive heart failure with reduced ejection fraction/sinus tachycardia Echo showed ejection fraction of 20 to 25%.  Cardiology was consulted.  Patient placed on IV diuretics.  Also started on digoxin, Entresto and spironolactone.  Subsequently also started on carvedilol. Patient underwent cardiac catheterization on 12/7 which reveals normal coronary artery disease.  Reason for his cardiomyopathy is not entirely clear. Cardiology continues to follow and manage.  IV diuretics to be placed on hold today due to significant hyponatremia. Heart rate remains elevated.  Patient remains on carvedilol. It appears that he now has prior authorization approval for [place].  Will defer to cardiology to initiate this drug. Continue to monitor strict ins and outs and daily weights. Mobilize as tolerated.   Hyponatremia Sodium level noted to be 122 this morning.  It was 130 yesterday.  Quite a significant drop.  We will recheck to confirm that it is indeed this low.  Diuretics placed on hold.    Abdominal distention No nausea vomiting  reported.  Abdomen is benign on examination.  Abdominal film to be obtained.  Acute hypoxic respiratory failure Secondary to CHF exacerbation.  CT angiogram did not show any PE.  Requiring 1 to 2 L of oxygen at times.  Mediastinal lymphadenopathy Follows with oncology.  High suspicion for Hodgkin's lymphoma.  CT imaging showed progressive lymphadenopathy.  [name] supposed to follow-up with cardiothoracic surgery for biopsy of the lymph nodes.  Will need to wait till his cardiac status stabilizes.    Essential hypertension Blood pressure reasonably well controlled.  Continue to monitor.  Diabetes type 2 On Metformin at home.  Hemoglobin A1c of 8.1.  Dose of Levemir being adjusted.  CBGs are better over the last 12 hours.  Continue to monitor without any further changes to insulin dose today.    Hypothyroidism Continue Synthyroid.  Mild elevated TSH level. Free t4 mildly elevated.  Results inconclusive. Repeat TFTs in 4 to 6 weeks.  BPH Takes Flomax  Hyperlipidemia Takes Lipitor at home.  Constipation Appears to have resolved with enema and laxatives which will be continued.  Also on stool softeners   Morbid obesity Estimated body mass index is 40.55 kg/m as calculated from the following:   Height as of this encounter: 5\' 9"  (1.753 m).   Weight as of this encounter: 124.6 kg.   DVT prophylaxis: Lovenox Code Status: Full Family Communication: Discussed with the patient Disposition: Hopefully return back to ALF when improved.  Status is: Inpatient  Remains inpatient appropriate because:Inpatient level of care appropriate due to severity of illness   Dispo: The patient is from: Home( ALF)  Anticipated d/c is to: Home              Anticipated d/c date is: 1-2 days              Patient currently is not medically stable to d/c.     Consultants: Cardiology  Procedures: Cardiac catheterization 94/7  LV end diastolic pressure is severely elevated. LVEDP 29  mm Hg.  There is no aortic valve stenosis.  No angiographically apparent CAD.  Ao sat 98%, PA sat 69%, PA pressure 34/22, mean 26 mm Hg; mean PCWP 26 mm Hg; CO 4.96 L/min; CI 2.09    Antimicrobials:  Anti-infectives (From admission, onward)   None      Subjective: Patient sitting up in the chair eating his breakfast.  Denies any chest pain.  Shortness of breath is improving.    Objective: Vitals:   10/21/20 0108 10/21/20 0431 10/21/20 0500 10/21/20 0812  BP: 107/71 127/72  136/75  Pulse: (!) 110 69  (!) 120  Resp: 18 18  20   Temp: 98.1 F (36.7 C) 98.3 F (36.8 C)  98.2 F (36.8 C)  TempSrc: Oral Oral  Oral  SpO2: 94% 93%  91%  Weight:   124.6 kg   Height:        Intake/Output Summary (Last 24 hours) at 10/21/2020 1003 Last data filed at 10/21/2020 0823 Gross per 24 hour  Intake 700 ml  Output 2801 ml  Net -2101 ml   Filed Weights   10/19/20 0538 10/20/20 0338 10/21/20 0500  Weight: 125.1 kg 124.8 kg 124.6 kg    Examination:  General appearance: Awake alert.  In no distress Resp: Diminished air entry at the bases with few crackles.  Normal effort at rest. Cardio: S1-S2 is normal regular.  No S3-S4.  No rubs murmurs or bruit GI: Abdomen is soft.  Mildly distended but nontender.  Bowel sounds present.  No masses organomegaly.   Extremities: Mild edema noted bilateral lower extremities.  Improved from before. Neurologic: Alert and oriented x3.  No focal neurological deficits.       Data Reviewed: I have personally reviewed following labs and imaging studies  CBC: Recent Labs  Lab 10/14/20 1019 10/15/20 0406 10/19/20 0523  WBC  --  12.3* 10.7*  NEUTROABS  --  8.1*  --   HGB 18.4* 15.6 15.6  HCT 54.0* 48.4 48.3  MCV  --  76.8* 77.4*  PLT  --  203 096   Basic Metabolic Panel: Recent Labs  Lab 10/17/20 0053 10/18/20 0500 10/19/20 0523 10/20/20 0500 10/21/20 0555  NA 128* 131* 134* 130* 122*  K 4.6 4.9 4.6 4.7 4.7  CL 92* 92* 94* 92* 86*   CO2 22 28 28 26 26   GLUCOSE 373* 235* 239* 263* 219*  BUN 22* 21* 24* 24* 22*  CREATININE 1.12 1.24 1.20 1.18 1.21  CALCIUM 9.2 9.7 9.6 9.2 8.6*   GFR: Estimated Creatinine Clearance: 89 mL/min (by C-G formula based on SCr of 1.21 mg/dL).  CBG: Recent Labs  Lab 10/20/20 0957 10/20/20 1153 10/20/20 1605 10/20/20 2135 10/21/20 0734  GLUCAP 305* 291* 198* 275* 217*    No results found for this or any previous visit (from the past 240 hour(s)).       Radiology Studies: No results found.      Scheduled Meds: . atorvastatin  10 mg Oral Daily  . brimonidine  1 drop Both Eyes BID  . carvedilol  6.25 mg Oral BID WC  .  Chlorhexidine Gluconate Cloth  6 each Topical Daily  . cyclobenzaprine  10 mg Oral TID  . digoxin  0.125 mg Oral Daily  . enoxaparin (LOVENOX) injection  60 mg Subcutaneous Q24H  . feeding supplement (GLUCERNA SHAKE)  237 mL Oral TID BM  . insulin aspart  0-15 Units Subcutaneous TID WC  . insulin detemir  28 Units Subcutaneous Daily  . latanoprost  1 drop Both Eyes QHS  . levothyroxine  100 mcg Oral Q0600  . melatonin  3 mg Oral QHS  . polyethylene glycol  17 g Oral BID  . risperiDONE  0.5 mg Oral QHS  . sacubitril-valsartan  1 tablet Oral BID  . senna  2 tablet Oral BID  . sodium chloride flush  10-40 mL Intracatheter Q12H  . sodium chloride flush  3 mL Intravenous Q12H  . sodium chloride flush  3 mL Intravenous Q12H  . spironolactone  25 mg Oral Daily  . tamsulosin  0.4 mg Oral Daily   Continuous Infusions: . sodium chloride       LOS: 10 days      Bonnielee Haff, MD [facility] P12/14/2021, 10:03 AM

## 2020-10-21 NOTE — Plan of Care (Signed)
  Problem: Fluid Volume: Goal: Ability to maintain a balanced intake and output will improve Outcome: Progressing   Problem: Metabolic: Goal: Ability to maintain appropriate glucose levels will improve Outcome: Progressing   Problem: Nutritional: Goal: Maintenance of adequate nutrition will improve Outcome: Progressing   

## 2020-10-21 NOTE — Progress Notes (Signed)
Inpatient Diabetes Program Recommendations  AACE/ADA: New Consensus Statement on Inpatient Glycemic Control (2015)  Target Ranges:  Prepandial:   less than 140 mg/dL      Peak postprandial:   less than 180 mg/dL (1-2 hours)      Critically ill patients:  140 - 180 mg/dL   Lab Results  Component Value Date   GLUCAP 217 (H) 10/21/2020   HGBA1C 8.1 (H) 10/11/2020    Review of Glycemic Control Results for Charles, [name] (MRN [id number]) as of 10/21/2020 11:03  Ref. Range 10/20/2020 09:57 10/20/2020 11:53 10/20/2020 16:05 10/20/2020 21:35 10/21/2020 07:34  Glucose-Capillary Latest Ref Range: 70 - 99 mg/dL 305 (H) 291 (H) 198 (H) 275 (H) 217 (H)    Current orders for Inpatient glycemic control:  Levemir 28 units daily Novolog 0-15 units TID  Inpatient Diabetes Program Recommendations:    Fasting CBG is better with increase of Levemir.  Please also consider Novolog 0-5 units QHS  Will continue to follow while inpatient.  Thank you, Reche Dixon, RN, BSN Diabetes Coordinator Inpatient Diabetes Program [phone] (team pager from 8a-5p)

## 2020-10-21 NOTE — Progress Notes (Signed)
Progress Note  Patient Name: Charles [name] Date of Encounter: 10/21/2020  [facility] Cardiologist: [name], MD   Subjective   Negative 2L additional overnight - creatinine stable, but sodium significantly lower today at 122 (from 130). Weight is unchanged. Remains tachycardic. Abdomen is distended today - somewhat tympanitic. He has had BM's.  Inpatient Medications    Scheduled Meds: . atorvastatin  10 mg Oral Daily  . brimonidine  1 drop Both Eyes BID  . carvedilol  6.25 mg Oral BID WC  . Chlorhexidine Gluconate Cloth  6 each Topical Daily  . cyclobenzaprine  10 mg Oral TID  . digoxin  0.125 mg Oral Daily  . enoxaparin (LOVENOX) injection  60 mg Subcutaneous Q24H  . feeding supplement (GLUCERNA SHAKE)  237 mL Oral TID BM  . furosemide  80 mg Intravenous Q12H  . insulin aspart  0-15 Units Subcutaneous TID WC  . insulin detemir  28 Units Subcutaneous Daily  . latanoprost  1 drop Both Eyes QHS  . levothyroxine  100 mcg Oral Q0600  . melatonin  3 mg Oral QHS  . polyethylene glycol  17 g Oral BID  . risperiDONE  0.5 mg Oral QHS  . sacubitril-valsartan  1 tablet Oral BID  . senna  2 tablet Oral BID  . sodium chloride flush  10-40 mL Intracatheter Q12H  . sodium chloride flush  3 mL Intravenous Q12H  . sodium chloride flush  3 mL Intravenous Q12H  . spironolactone  25 mg Oral Daily  . tamsulosin  0.4 mg Oral Daily   Continuous Infusions: . sodium chloride     PRN Meds: sodium chloride, acetaminophen **OR** acetaminophen, alum & mag hydroxide-simeth, guaiFENesin-codeine, magnesium hydroxide, ondansetron **OR** [DISCONTINUED] ondansetron (ZOFRAN) IV, sodium chloride flush, sodium chloride flush, sodium phosphate   Vital Signs    Vitals:   10/21/20 0108 10/21/20 0431 10/21/20 0500 10/21/20 0812  BP: 107/71 127/72  136/75  Pulse: (!) 110 69  (!) 120  Resp: 18 18  20   Temp: 98.1 F (36.7 C) 98.3 F (36.8 C)  98.2 F (36.8 C)  TempSrc: Oral Oral  Oral   SpO2: 94% 93%  91%  Weight:   124.6 kg   Height:        Intake/Output Summary (Last 24 hours) at 10/21/2020 0826 Last data filed at 10/21/2020 1224 Gross per 24 hour  Intake 700 ml  Output 2801 ml  Net -2101 ml   Last 3 Weights 10/21/2020 10/20/2020 10/19/2020  Weight (lbs) 274 lb 9.6 oz 275 lb 3.2 oz 275 lb 12.7 oz  Weight (kg) 124.558 kg 124.83 kg 125.1 kg      Telemetry    Sinus tachycardia in 120's - personally reviewed  Physical Exam   General appearance: alert and no distress Neck: no carotid bruit, no JVD and thyroid not enlarged, symmetric, no tenderness/mass/nodules Lungs: clear to auscultation bilaterally Heart: regular tachycardia\ Abdomen: protuberant, tympanitic, hypoactive BS Extremities: extremities normal, atraumatic, no cyanosis or edema Pulses: 2+ and symmetric Skin: Skin color, texture, turgor normal. No rashes or lesions Neurologic: Grossly normal Psych: Pleasant  Labs    High Sensitivity Troponin:   Recent Labs  Lab 10/11/20 0254 10/11/20 0521  TROPONINIHS 28* 25*      Chemistry Recent Labs  Lab 10/19/20 0523 10/20/20 0500 10/21/20 0555  NA 134* 130* 122*  K 4.6 4.7 4.7  CL 94* 92* 86*  CO2 28 26 26   GLUCOSE 239* 263* 219*  BUN 24* 24* 22*  CREATININE 1.20 1.18 1.21  CALCIUM 9.6 9.2 8.6*  GFRNONAA >60 >60 >60  ANIONGAP 12 12 10      Hematology Recent Labs  Lab 10/14/20 1019 10/15/20 0406 10/19/20 0523  WBC  --  12.3* 10.7*  RBC  --  6.30* 6.24*  HGB 18.4* 15.6 15.6  HCT 54.0* 48.4 48.3  MCV  --  76.8* 77.4*  MCH  --  24.8* 25.0*  MCHC  --  32.2 32.3  RDW  --  18.8* 18.6*  PLT  --  203 207    BNP No results for input(s): BNP, PROBNP in the last 168 hours.   Radiology    No results found.  Patient Profile     54 y.o. male with past medical history of hypertension, diabetes mellitus, hyperlipidemia, hypothyroidism, bipolar disorder, paranoid schizophrenia, tobacco abuse, possible newly diagnosed lymphoma for  evaluation of acute systolic congestive heart failure.  Echocardiogram this admission shows ejection fraction 20 to 25%, severe left ventricular enlargement.  Assessment & Plan    1 CHF:  Acute systolic - appears euvolemic. No CAD at cath PCWP 26 mmHg - coreg recently increased. Now 18L negative -will hold on diuretics today given hyponatremia and worsening tachycardia. Continue current dose BB.   2 HLD -continue statin.  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 Further evaluation once cardiac status improves.  Management per primary care. He has f/u for oncology suspect he will need biopsy PET scan positive   4. Tachycardia - appears to be sinus, may be d/t volume depletion. Sodium trending down, but BUN/Cr stable - significant diuresis of almost 18L since admit. Will hold lasix today.  5. Hyponatremia - this has been a recurring issue - thought to be due to CHF, ?diuretic related - ?SIADH. Discussed free water restriction with him, but he doesn't drink all that much.   6. Abdominal distention - minimal discomfort. I suspect this is obstipation, no ascites by abdominal ultrasound on 12/4. Has had a few BM's. D/w Dr. [place] Pink, will check abdominal x-ray today.  For questions or updates, please contact [place] Please consult [email or web address] for contact info under   Pixie Casino, MD, FACC, [place] Director of the [facility]  [facility] Diplomate of the American Board of Clinical Lipidology Attending Cardiologist  Direct Dial: [phone]  Fax: [phone]  Website:  [email or web address]  Pixie Casino, MD  10/21/2020, 8:26 AM

## 2020-10-21 NOTE — Progress Notes (Signed)
Heart Failure Stewardship Pharmacist Progress Note   PCP: Patient, No Pcp Per PCP-Cardiologist: [name], MD    HPI:  54 yo M with PMH of HTN, diabetes, HLD, hypothyroidism, bipolar disorder, paranoid schizophrenia, and tobacco abuse. He presented to the ED on 10/10/20 with worsening shortness of breath and has been admitted for acute CHF. ECHO was done on 10/11/20 and LVEF was severely decreased to 20-25%. R/LHC done on 10/14/20 with no apparent CAD, PAP 26, PCWP 26, CO 4.96, CI 2.09.  Current HF Medications: Carvedilol 6.25 mg BID Entresto 24/26 mg BID Spironolactone 25 mg daily Digoxin 0.125 mg daily  Prior to admission HF Medications: Metoprolol tartrate 25 mg BID Lisinopril 2.5 mg daily  Pertinent Lab Values: . Serum creatinine 1.26, BUN 27, Potassium 4.9, Sodium 131, BNP 97, Digoxin level 0.4  Vital Signs: . Weight: 274 lbs (admission weight: 281 lbs) . Blood pressure: 120-130/70s  . Heart rate: 100-120s   Medication Assistance / Insurance Benefits Check: Does the patient have prescription insurance?  Yes Type of insurance plan: [facility] Medicaid  Outpatient Pharmacy:  [facility] Is the patient willing to use [facility] at discharge? Yes Is the patient willing to transition their outpatient pharmacy to utilize a [facility] outpatient pharmacy?   Pending    Assessment: 1. Acute systolic CHF (EF 33-00%), due to NICM. NYHA class II/III symptoms. - Holding lasix today given Na of 122 this AM, repeat was 131 - Continue carvedilol 6.25 mg BID - Continue Entresto 24/26 mg BID - Continue spironolactone 25 mg daily - Consider starting Farxiga 10 mg daily prior to discharge - Continue digoxin 0.125 mg daily. Digoxin level was 0.4 on 12/14. - Noted progressive hyponatremia, could consider tolvaptan if Na < 125.   Plan: 1) Medication changes recommended at this time: - Consider restarting furosemide with repeat Na of  131  2) Patient assistance application(s): - None pending, has Medicaid - Copays $3 and less - Prior authorization for Wilder Glade has been approved  3)  Education  - To be completed prior to discharge  Kerby Nora, PharmD, BCPS Heart Failure Cytogeneticist Phone [phone]

## 2020-10-22 LAB — BASIC METABOLIC PANEL
Anion gap: 10 (ref 5–15)
BUN: 22 mg/dL — ABNORMAL HIGH (ref 6–20)
CO2: 27 mmol/L (ref 22–32)
Calcium: 9.6 mg/dL (ref 8.9–10.3)
Chloride: 92 mmol/L — ABNORMAL LOW (ref 98–111)
Creatinine, Ser: 1.21 mg/dL (ref 0.61–1.24)
GFR, Estimated: >60 mL/min (ref >60)
Glucose, Bld: 275 mg/dL — ABNORMAL HIGH (ref 70–99)
Potassium: 4.6 mmol/L (ref 3.5–5.1)
Sodium: 129 mmol/L — ABNORMAL LOW (ref 135–145)

## 2020-10-22 LAB — GLUCOSE, CAPILLARY
Glucose-Capillary: 215 mg/dL — ABNORMAL HIGH (ref 70–99)
Glucose-Capillary: 227 mg/dL — ABNORMAL HIGH (ref 70–99)
Glucose-Capillary: 166 mg/dL — ABNORMAL HIGH (ref 70–99)

## 2020-10-22 LAB — SARS CORONAVIRUS 2 BY RT PCR (HOSPITAL ORDER, PERFORMED IN ~~LOC~~ HOSPITAL LAB): SARS Coronavirus 2: NEGATIVE

## 2020-10-22 MED ORDER — FUROSEMIDE 40 MG PO TABS
40.0000 mg | ORAL_TABLET | Freq: Every day | ORAL | Status: DC
  Administered 2020-10-22 – 2020-10-23 (×2): 40 mg via ORAL
  Filled 2020-10-22 (×2): qty 1

## 2020-10-22 MED ORDER — METOPROLOL TARTRATE 25 MG PO TABS
25.0000 mg | ORAL_TABLET | Freq: Once | ORAL | Status: AC
  Administered 2020-10-22: 16:00:00 25 mg via ORAL
  Filled 2020-10-22: qty 1

## 2020-10-22 MED ORDER — CARVEDILOL 12.5 MG PO TABS: 12.5000 mg | ORAL_TABLET | Freq: Two times a day (BID) | ORAL | Status: DC

## 2020-10-22 MED ORDER — METOPROLOL SUCCINATE ER 50 MG PO TB24
50.0000 mg | ORAL_TABLET | Freq: Every day | ORAL | Status: DC
  Administered 2020-10-22: 21:00:00 50 mg via ORAL
  Filled 2020-10-22: qty 1

## 2020-10-22 MED ORDER — CARVEDILOL 6.25 MG PO TABS: 6.2500 mg | ORAL_TABLET | ORAL | Status: DC

## 2020-10-22 MED ORDER — DAPAGLIFLOZIN PROPANEDIOL 10 MG PO TABS
10.0000 mg | ORAL_TABLET | Freq: Every day | ORAL | Status: DC
  Administered 2020-10-22 – 2020-10-23 (×2): 10 mg via ORAL
  Filled 2020-10-22 (×3): qty 1

## 2020-10-22 NOTE — Progress Notes (Signed)
Physical Therapy Treatment Patient Details Name: Charles [name] MRN: [id number] DOB: 1966-01-11 Today's Date: 10/22/2020    History of Present Illness 54 yo M with PMH of HTN, diabetes, HLD, hypothyroidism, bipolar disorder, paranoid schizophrenia, and tobacco abuse. He presented to the ED on 10/10/20 with worsening shortness of breath and has been admitted for acute CHF. ECHO was done on 10/11/20 and LVEF was severely decreased to 20-25% Cardiac cath performed 12/7 revealed no CAD.    PT Comments    Pt performed gt training and LE exercises this session.  Focused on quad strength as R knee buckling noted during gt training.  Continue to recommend HHPT.  Plan for gt with device next session to improve independence.   Follow Up Recommendations  Home health PT;Supervision for mobility/OOB     Equipment Recommendations  None recommended by PT    Recommendations for Other Services       Precautions / Restrictions Precautions Precautions: Fall Precaution Comments: monitor telemetry Restrictions Weight Bearing Restrictions: No    Mobility  Bed Mobility               General bed mobility comments: received up in chair  Transfers Overall transfer level: Needs assistance Equipment used: None Transfers: Sit to/from Stand Sit to Stand: Supervision         General transfer comment: Cues for safety.  Ambulation/Gait Ambulation/Gait assistance: Min assist Gait Distance (Feet): 130 Feet Assistive device: None (intermittent use of rail in hall) Gait Pattern/deviations: Step-through pattern;Decreased stride length;Wide base of support     General Gait Details: Cues for R knee extension.  Minor buckling in R knee.   Stairs             Wheelchair Mobility    Modified Rankin (Stroke Patients Only)       Balance Overall balance assessment: Needs assistance Sitting-balance support: Feet supported Sitting balance-Leahy Scale: Good       Standing balance-Leahy  Scale: Fair                              Cognition Arousal/Alertness: Awake/alert Behavior During Therapy: WFL for tasks assessed/performed Overall Cognitive Status: Within Functional Limits for tasks assessed                                        Exercises General Exercises - Lower Extremity Quad Sets: AROM;Both;10 reps;Supine Long Arc Quad: AROM;Strengthening;Both;Seated;10 reps Straight Leg Raises: AROM;Both;10 reps;Supine Hip Flexion/Marching: AROM;Strengthening;Both;10 reps;Seated    General Comments        Pertinent Vitals/Pain Pain Assessment: 0-10 Pain Score: 3  Pain Location: R knee Pain Descriptors / Indicators: Discomfort Pain Intervention(s): Repositioned    Home Living                      Prior Function            PT Goals (current goals can now be found in the care plan section) Acute Rehab PT Goals Patient Stated Goal: get better and get home Potential to Achieve Goals: Good Progress towards PT goals: Progressing toward goals    Frequency    Min 3X/week      PT Plan Current plan remains appropriate    Co-evaluation              AM-PAC PT "6 Clicks" Mobility  Outcome Measure  Help needed turning from your back to your side while in a flat bed without using bedrails?: None Help needed moving from lying on your back to sitting on the side of a flat bed without using bedrails?: None Help needed moving to and from a bed to a chair (including a wheelchair)?: A Little Help needed standing up from a chair using your arms (e.g., wheelchair or bedside chair)?: A Little Help needed to walk in hospital room?: A Little Help needed climbing 3-5 steps with a railing? : A Little 6 Click Score: 20    End of Session Equipment Utilized During Treatment: Gait belt Activity Tolerance: Patient tolerated treatment well Patient left: in chair;with call bell/phone within reach;with nursing/sitter in room Nurse  Communication: Mobility status PT Visit Diagnosis: Unsteadiness on feet (R26.81);Difficulty in walking, not elsewhere classified (R26.2)     Time: 7530-0511 PT Time Calculation (min) (ACUTE ONLY): 18 min  Charges:  $Gait Training: 8-22 mins                     Erasmo Leventhal , PTA Acute Rehabilitation Services Pager [phone] Office [phone]     Kaydan Wong Eli Hose 10/22/2020, 6:17 PM

## 2020-10-22 NOTE — Progress Notes (Addendum)
Progress Note  Patient Name: Charles [name] Date of Encounter: 10/22/2020  [facility] Cardiologist: [name], MD   Subjective   800 cc negative overnight - small increase in creatinine. Sodium of 122 was apparently spurius as repeat was 131. Abdominal x-ray showed moderate stool burden.  Inpatient Medications    Scheduled Meds: . atorvastatin  10 mg Oral Daily  . brimonidine  1 drop Both Eyes BID  . carvedilol  6.25 mg Oral BID WC  . Chlorhexidine Gluconate Cloth  6 each Topical Daily  . cyclobenzaprine  10 mg Oral TID  . digoxin  0.125 mg Oral Daily  . enoxaparin (LOVENOX) injection  60 mg Subcutaneous Q24H  . feeding supplement (GLUCERNA SHAKE)  237 mL Oral TID BM  . insulin aspart  0-15 Units Subcutaneous TID WC  . insulin detemir  28 Units Subcutaneous Daily  . latanoprost  1 drop Both Eyes QHS  . levothyroxine  100 mcg Oral Q0600  . melatonin  3 mg Oral QHS  . polyethylene glycol  17 g Oral BID  . risperiDONE  0.5 mg Oral QHS  . sacubitril-valsartan  1 tablet Oral BID  . senna  2 tablet Oral BID  . sodium chloride flush  10-40 mL Intracatheter Q12H  . sodium chloride flush  3 mL Intravenous Q12H  . sodium chloride flush  3 mL Intravenous Q12H  . spironolactone  25 mg Oral Daily  . tamsulosin  0.4 mg Oral Daily   Continuous Infusions: . sodium chloride     PRN Meds: sodium chloride, acetaminophen **OR** acetaminophen, alum & mag hydroxide-simeth, guaiFENesin-codeine, magnesium hydroxide, ondansetron **OR** [DISCONTINUED] ondansetron (ZOFRAN) IV, sodium chloride flush, sodium chloride flush, sodium phosphate   Vital Signs    Vitals:   10/22/20 0052 10/22/20 0500 10/22/20 0738 10/22/20 0829  BP: 129/65 110/74 121/78 121/78  Pulse: (!) 109 (!) 104 (!) 109 (!) 126  Resp: 20  16   Temp: (!) 97.5 F (36.4 C) 98.1 F (36.7 C) 97.6 F (36.4 C)   TempSrc: Oral Oral Oral   SpO2: 94% 95% 92%   Weight:      Height:        Intake/Output Summary  (Last 24 hours) at 10/22/2020 0856 Last data filed at 10/22/2020 0400 Gross per 24 hour  Intake 460 ml  Output 1520 ml  Net -1060 ml   Last 3 Weights 10/21/2020 10/20/2020 10/19/2020  Weight (lbs) 274 lb 9.6 oz 275 lb 3.2 oz 275 lb 12.7 oz  Weight (kg) 124.558 kg 124.83 kg 125.1 kg      Telemetry    Sinus tachycardia in 120's - personally reviewed  Physical Exam   General appearance: alert and no distress Neck: no carotid bruit, no JVD and thyroid not enlarged, symmetric, no tenderness/mass/nodules Lungs: clear to auscultation bilaterally Heart: regular tachycardia\ Abdomen: protuberant, tympanitic, hypoactive BS Extremities: extremities normal, atraumatic, no cyanosis or edema Pulses: 2+ and symmetric Skin: Skin color, texture, turgor normal. No rashes or lesions Neurologic: Grossly normal Psych: Pleasant  Labs    High Sensitivity Troponin:   Recent Labs  Lab 10/11/20 0254 10/11/20 0521  TROPONINIHS 28* 25*      Chemistry Recent Labs  Lab 10/20/20 0500 10/21/20 0555 10/21/20 1127  NA 130* 122* 131*  K 4.7 4.7 4.9  CL 92* 86* 90*  CO2 26 26 25   GLUCOSE 263* 219* 246*  BUN 24* 22* 27*  CREATININE 1.18 1.21 1.26*  CALCIUM 9.2 8.6* 9.7  GFRNONAA >60 >  60 >60  ANIONGAP 12 10 16*     Hematology Recent Labs  Lab 10/19/20 0523  WBC 10.7*  RBC 6.24*  HGB 15.6  HCT 48.3  MCV 77.4*  MCH 25.0*  MCHC 32.3  RDW 18.6*  PLT 207    BNP No results for input(s): BNP, PROBNP in the last 168 hours.   Radiology    DG Abd 2 Views  Result Date: 10/21/2020 CLINICAL DATA:  Abdominal pain and distension EXAM: ABDOMEN - 2 VIEW COMPARISON:  CT abdomen and pelvis July 18, 2020 FINDINGS: Supine and upright images were obtained. There is moderate stool throughout the colon. There is no bowel dilatation or air-fluid level to suggest bowel obstruction. No free air. Clips noted in prostate region. Lung bases clear. IMPRESSION: No bowel obstruction or free air.  Lung  bases clear. Electronically Signed   By: Lowella Grip III M.D.   On: 10/21/2020 12:40    Patient Profile     54 y.o. 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  Echocardiogram this admission shows ejection fraction 20 to 25%, severe left ventricular enlargement.  Assessment & Plan    1 CHF:  Acute systolic - appears euvolemic. No CAD at cath PCWP 26 mmHg - coreg recently increased. Now 18L negative - ok to transition to oral lasix 40 mg daily. Switch coreg to Toprol XL 50 mg daily starting tonight.  2 HLD -continue statin.  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 Further evaluation once cardiac status improves.  Management per primary care. He has f/u for oncology suspect he will need biopsy PET scan positive   4. Tachycardia - appears to be sinus, may be d/t volume depletion. No significant change in tachycardia. Switch coreg to Toprol XL 50 mg daily tonight.  5. Hyponatremia - this has been a recurring issue - does seem to be improving (122 value was spurius) with diuresis. I think we can transition to oral diuretics today.  6. Abdominal distention - minimal discomfort. I suspect this is obstipation - xray shows moderate stool burden. Continue to work with laxatives and cathartics.  For questions or updates, please contact [place] Please consult [email or web address] for contact info under   Pixie Casino, MD, FACC, [place] Director of the [facility]  Card[facility] Diplomate of the American Board of Clinical Lipidology Attending Cardiologist  Direct Dial: [phone]  Fax: [phone]  Website:  [email or web address]  Pixie Casino, MD  10/22/2020, 8:56 AM

## 2020-10-22 NOTE — Progress Notes (Addendum)
PROGRESS NOTE    Charles [name]  PRF:[id number] DOB: 10/19/1966 DOA: 10/10/2020 PCP: Patient, No Pcp Per    Brief Narrative: [name] a 54 year old male with history of hypertension, diabetes type 2, obesity who presented to the emergency department from ALF with complaints of shortness of breath.  He was recently suspected to have lymphoma due to finding of metastatic lymphadenopathy and was following with oncology and has been planned for biopsy by CT surgery. He follows with Dr. Lorenso Courier. On presentation he was tachycardic, hypoxic needing 2 L of oxygen per minute.  Chest x-ray showed possible pulmonary edema.  Also was endorsing shortness of breath. CT angiogram of the chest did not show any PE but showed worsening mediastinal lymphadenopathy. Echocardiogram showed severe reduction in ejection fraction of 20 to 25%,which is a new finding.    Assessment & Plan:   Acute congestive heart failure with reduced ejection fraction/sinus tachycardia Echo showed ejection fraction of 20 to 25%.  Cardiology was consulted.  Patient placed on IV diuretics.  Also started on digoxin, Entresto and spironolactone.  Subsequently also started on carvedilol. Patient underwent cardiac catheterization on 12/7 which reveals normal coronary artery disease.  Reason for his cardiomyopathy is not entirely clear. Cardiology continues to follow and manage.  IV diuretics were placed on hold due to significant hyponatremia and also it was felt that patient was slightly on the hypovolemic side.  Sodium level has been stable.  Patient placed back on oral diuretics today.  Heart rate remains poorly controlled.  Cardiology has changed him over from carvedilol to Toprol. It appears that he now has prior authorization approval for [place].  Discussed with Dr. Debara Pickett.  Will initiate [place] today.  Continue to monitor strict ins and outs and daily weights. Mobilize as tolerated.   Hyponatremia Yesterday morning sodium level of 122  was likely erroneous.  Repeat check was 131.  Noted to be 129 this morning.  Likely due to hypovolemia and diuretics.  Monitor periodically.    Abdominal distention No nausea vomiting reported.  Abdomen is benign on examination.  Abdominal film does not reveal any obstruction.  This is all likely due to obstipation and constipation.  Acute hypoxic respiratory failure Secondary to CHF exacerbation.  CT angiogram did not show any PE.  Requiring 1 to 2 L of oxygen at times.  But mostly saturating normal on room air.  Mediastinal lymphadenopathy Follows with oncology.  High suspicion for Hodgkin's lymphoma.  CT imaging showed progressive lymphadenopathy.  [name] supposed to follow-up with cardiothoracic surgery for biopsy of the lymph nodes.  His appointment is on December 28 with Dr. Roxan Hockey.  Essential hypertension Blood pressure reasonably well controlled.  Continue to monitor.  Diabetes type 2 On Metformin at home.  Hemoglobin A1c of 8.1.  Dose of Levemir being adjusted.  CBGs are better over the last 12 hours.  Continue current dose.  We may be starting him on Farxiga for his CHF which may help with his glycemic control as well.  Hypothyroidism Continue Synthyroid.  Mild elevated TSH level. Free t4 mildly elevated.  Results inconclusive. Repeat TFTs in 4 to 6 weeks.  BPH Takes Flomax  Hyperlipidemia Takes Lipitor at home.  Constipation Appears to have resolved with enema and laxatives which will be continued.  Also on stool softeners   Morbid obesity Estimated body mass index is 40.55 kg/m as calculated from the following:   Height as of this encounter: 5\' 9"  (1.753 m).   Weight as of  this encounter: 124.6 kg.   DVT prophylaxis: Lovenox Code Status: Full Family Communication: Discussed with the patient Disposition: Plan is for him to return to assisted living facility.  Anticipate discharge tomorrow if heart rate and blood work is stable.  Will order Covid  test.  Status is: Inpatient  Remains inpatient appropriate because:Inpatient level of care appropriate due to severity of illness   Dispo: The patient is from: Home( ALF)              Anticipated d/c is to: Home              Anticipated d/c date is: 1-2 days              Patient currently is not medically stable to d/c.     Consultants: Cardiology  Procedures: Cardiac catheterization 24/5  LV end diastolic pressure is severely elevated. LVEDP 29 mm Hg.  There is no aortic valve stenosis.  No angiographically apparent CAD.  Ao sat 98%, PA sat 69%, PA pressure 34/22, mean 26 mm Hg; mean PCWP 26 mm Hg; CO 4.96 L/min; CI 2.09    Antimicrobials:  Anti-infectives (From admission, onward)   None      Subjective: Patient states that he feels well.  Denies any chest pain or shortness of breath.  Not sure if he had a bowel movement yesterday.  Has been passing gas.  No nausea vomiting.  Objective: Vitals:   10/22/20 0500 10/22/20 0738 10/22/20 0829 10/22/20 0857  BP: 110/74 121/78 121/78 121/83  Pulse: (!) 104 (!) 109 (!) 126 (!) 120  Resp:  16  20  Temp: 98.1 F (36.7 C) 97.6 F (36.4 C)  98 F (36.7 C)  TempSrc: Oral Oral    SpO2: 95% 92%  93%  Weight:      Height:        Intake/Output Summary (Last 24 hours) at 10/22/2020 1029 Last data filed at 10/22/2020 0930 Gross per 24 hour  Intake 480 ml  Output 1520 ml  Net -1040 ml   Filed Weights   10/19/20 0538 10/20/20 0338 10/21/20 0500  Weight: 125.1 kg 124.8 kg 124.6 kg    Examination:  General appearance: Awake alert.  In no distress Resp: Improved air entry bilaterally.  No wheezing or rhonchi.  Few crackles at the bases. Cardio: S1-S2 is tachycardic regular.  No S3-S4.  No rubs murmurs or bruit GI: Abdomen is soft.  Nontender nondistended.  Bowel sounds are present normal.  No masses organomegaly Extremities: Edema bilateral lower extremities Neurologic: Alert and oriented x3.  No focal neurological  deficits.       Data Reviewed: I have personally reviewed following labs and imaging studies  CBC: Recent Labs  Lab 10/19/20 0523  WBC 10.7*  HGB 15.6  HCT 48.3  MCV 77.4*  PLT 809   Basic Metabolic Panel: Recent Labs  Lab 10/19/20 0523 10/20/20 0500 10/21/20 0555 10/21/20 1127 10/22/20 0925  NA 134* 130* 122* 131* 129*  K 4.6 4.7 4.7 4.9 4.6  CL 94* 92* 86* 90* 92*  CO2 28 26 26 25 27   GLUCOSE 239* 263* 219* 246* 275*  BUN 24* 24* 22* 27* 22*  CREATININE 1.20 1.18 1.21 1.26* 1.21  CALCIUM 9.6 9.2 8.6* 9.7 9.6   GFR: Estimated Creatinine Clearance: 89 mL/min (by C-G formula based on SCr of 1.21 mg/dL).  CBG: Recent Labs  Lab 10/21/20 0734 10/21/20 1137 10/21/20 1638 10/21/20 2148 10/22/20 0736  GLUCAP 217* 276* 183* 259*  215*    No results found for this or any previous visit (from the past 240 hour(s)).       Radiology Studies: DG Abd 2 Views  Result Date: 10/21/2020 CLINICAL DATA:  Abdominal pain and distension EXAM: ABDOMEN - 2 VIEW COMPARISON:  CT abdomen and pelvis July 18, 2020 FINDINGS: Supine and upright images were obtained. There is moderate stool throughout the colon. There is no bowel dilatation or air-fluid level to suggest bowel obstruction. No free air. Clips noted in prostate region. Lung bases clear. IMPRESSION: No bowel obstruction or free air.  Lung bases clear. Electronically Signed   By: Lowella Grip III M.D.   On: 10/21/2020 12:40        Scheduled Meds: . atorvastatin  10 mg Oral Daily  . brimonidine  1 drop Both Eyes BID  . Chlorhexidine Gluconate Cloth  6 each Topical Daily  . cyclobenzaprine  10 mg Oral TID  . digoxin  0.125 mg Oral Daily  . enoxaparin (LOVENOX) injection  60 mg Subcutaneous Q24H  . feeding supplement (GLUCERNA SHAKE)  237 mL Oral TID BM  . furosemide  40 mg Oral Daily  . insulin aspart  0-15 Units Subcutaneous TID WC  . insulin detemir  28 Units Subcutaneous Daily  . latanoprost  1 drop  Both Eyes QHS  . levothyroxine  100 mcg Oral Q0600  . melatonin  3 mg Oral QHS  . metoprolol succinate  50 mg Oral QHS  . polyethylene glycol  17 g Oral BID  . risperiDONE  0.5 mg Oral QHS  . sacubitril-valsartan  1 tablet Oral BID  . senna  2 tablet Oral BID  . sodium chloride flush  10-40 mL Intracatheter Q12H  . sodium chloride flush  3 mL Intravenous Q12H  . sodium chloride flush  3 mL Intravenous Q12H  . spironolactone  25 mg Oral Daily  . tamsulosin  0.4 mg Oral Daily   Continuous Infusions: . sodium chloride       LOS: 11 days      Bonnielee Haff, MD [facility] P12/15/2021, 10:29 AM

## 2020-10-22 NOTE — Progress Notes (Signed)
Heart Failure Stewardship Pharmacist Progress Note   PCP: Patient, No Pcp Per PCP-Cardiologist: [name], MD    HPI:  54 yo M with PMH of HTN, diabetes, HLD, hypothyroidism, bipolar disorder, paranoid schizophrenia, and tobacco abuse. He presented to the ED on 10/10/20 with worsening shortness of breath and has been admitted for acute CHF. ECHO was done on 10/11/20 and LVEF was severely decreased to 20-25%. R/LHC done on 10/14/20 with no apparent CAD, PAP 26, PCWP 26, CO 4.96, CI 2.09.  Current HF Medications: Furosemide 40 mg daily Metoprolol XL 50 mg daily Entresto 24/26 mg BID Spironolactone 25 mg daily Farxiga 10 mg daily Digoxin 0.125 mg daily  Prior to admission HF Medications: Metoprolol tartrate 25 mg BID Lisinopril 2.5 mg daily  Pertinent Lab Values: . Serum creatinine 1.21, BUN 22, Potassium 4.6, Sodium 129, BNP 97, Digoxin level 0.4  Vital Signs: . Weight: 274 lbs (admission weight: 281 lbs) . Blood pressure: 120/80s  . Heart rate: 100-120s   Medication Assistance / Insurance Benefits Check: Does the patient have prescription insurance?  Yes Type of insurance plan: [facility] Medicaid  Outpatient Pharmacy:  [facility] Is the patient willing to use [facility] at discharge? Yes Is the patient willing to transition their outpatient pharmacy to utilize a [facility] outpatient pharmacy?   Pending    Assessment: 1. Acute systolic CHF (EF 56-81%), due to NICM. NYHA class II/III symptoms. - Continue furosemide 40 mg daily - Continue metoprolol XL 50 mg daily - Continue Entresto 24/26 mg BID - Continue spironolactone 25 mg daily - Continue Farxiga 10 mg daily - Continue digoxin 0.125 mg daily. Digoxin level was 0.4 on 12/14. - Noted progressive hyponatremia, could consider tolvaptan if Na < 125.   Plan: 1) Medication changes recommended at this time: - Agree with changes as noted above  2) Patient assistance  application(s): - None pending, has Medicaid - Copays $3 and less - Prior authorization for [place] has been approved  3)  Education  - To be completed prior to discharge  Kerby Nora, PharmD, BCPS Heart Failure Cytogeneticist Phone [phone]

## 2020-10-23 ENCOUNTER — Other Ambulatory Visit (HOSPITAL_COMMUNITY): Payer: Self-pay | Admitting: Internal Medicine

## 2020-10-23 LAB — BASIC METABOLIC PANEL
Anion gap: 11 (ref 5–15)
BUN: 22 mg/dL — ABNORMAL HIGH (ref 6–20)
CO2: 25 mmol/L (ref 22–32)
Calcium: 9.9 mg/dL (ref 8.9–10.3)
Chloride: 96 mmol/L — ABNORMAL LOW (ref 98–111)
Creatinine, Ser: 1.14 mg/dL (ref 0.61–1.24)
GFR, Estimated: >60 mL/min (ref >60)
Glucose, Bld: 203 mg/dL — ABNORMAL HIGH (ref 70–99)
Potassium: 5.1 mmol/L (ref 3.5–5.1)
Sodium: 132 mmol/L — ABNORMAL LOW (ref 135–145)

## 2020-10-23 LAB — CBC
HCT: 46.6 % (ref 39.0–52.0)
Hemoglobin: 15.7 g/dL (ref 13.0–17.0)
MCH: 25.7 pg — ABNORMAL LOW (ref 26.0–34.0)
MCHC: 33.7 g/dL (ref 30.0–36.0)
MCV: 76.4 fL — ABNORMAL LOW (ref 80.0–100.0)
Platelets: 211 10*3/uL (ref 150–400)
RBC: 6.1 MIL/uL — ABNORMAL HIGH (ref 4.22–5.81)
RDW: 18.1 % — ABNORMAL HIGH (ref 11.5–15.5)
WBC: 10.3 10*3/uL (ref 4.0–10.5)
nRBC: 0 % (ref 0.0–0.2)

## 2020-10-23 LAB — GLUCOSE, CAPILLARY
Glucose-Capillary: 244 mg/dL — ABNORMAL HIGH (ref 70–99)
Glucose-Capillary: 254 mg/dL — ABNORMAL HIGH (ref 70–99)

## 2020-10-23 MED ORDER — "PEN NEEDLES 1/2"" 29G X 12MM MISC": 1.0000 | Freq: Every day | 0 refills | Status: AC

## 2020-10-23 MED ORDER — POLYETHYLENE GLYCOL 3350 17 G PO PACK: 17.0000 g | PACK | Freq: Two times a day (BID) | ORAL | 0 refills | Status: DC

## 2020-10-23 MED ORDER — DAPAGLIFLOZIN PROPANEDIOL 10 MG PO TABS: 10.0000 mg | ORAL_TABLET | Freq: Every day | ORAL | 3 refills | Status: DC

## 2020-10-23 MED ORDER — METOPROLOL SUCCINATE ER 50 MG PO TB24: 50.0000 mg | ORAL_TABLET | Freq: Every day | ORAL | 0 refills | Status: DC

## 2020-10-23 MED ORDER — DIGOXIN 125 MCG PO TABS
0.1250 mg | ORAL_TABLET | Freq: Every day | ORAL | 0 refills | Status: DC
Start: 2020-10-23 — End: 2020-12-24

## 2020-10-23 MED ORDER — BLOOD GLUCOSE MONITOR KIT: PACK | 0 refills | Status: AC

## 2020-10-23 MED ORDER — SENNA 8.6 MG PO TABS: 2.0000 | ORAL_TABLET | Freq: Two times a day (BID) | ORAL | 0 refills | Status: AC

## 2020-10-23 MED ORDER — SACUBITRIL-VALSARTAN 24-26 MG PO TABS: 1.0000 | ORAL_TABLET | Freq: Two times a day (BID) | ORAL | 0 refills | Status: AC

## 2020-10-23 MED ORDER — SPIRONOLACTONE 25 MG PO TABS: 25.0000 mg | ORAL_TABLET | Freq: Every day | ORAL | 0 refills | Status: DC

## 2020-10-23 MED ORDER — INSULIN DETEMIR 100 UNIT/ML FLEXPEN: 25.0000 [IU] | PEN_INJECTOR | Freq: Every day | SUBCUTANEOUS | 11 refills | Status: DC

## 2020-10-23 MED ORDER — FUROSEMIDE 40 MG PO TABS: 40.0000 mg | ORAL_TABLET | Freq: Every day | ORAL | 3 refills | Status: DC

## 2020-10-23 MED FILL — FUROSEMIDE 40 MG TABLET: 40 | 30 days supply | Qty: 30 | Fill #0

## 2020-10-23 MED FILL — METOPROLOL SUCCINATE ER 50: 50 | 30 days supply | Qty: 30 | Fill #0

## 2020-10-23 MED FILL — ENTRESTO 24 MG-26 MG TABLET: 24-26 | 30 days supply | Qty: 60 | Fill #0

## 2020-10-23 MED FILL — SENNA 8.6 MG TABS: 8.6 | 30 days supply | Qty: 120 | Fill #0

## 2020-10-23 MED FILL — DIGOXIN 0.125 MG TABLET: 125 | 30 days supply | Qty: 30 | Fill #0

## 2020-10-23 MED FILL — SPIRONOLACTONE 25 MG TABLET: 25 | 30 days supply | Qty: 30 | Fill #0

## 2020-10-23 MED FILL — FARXIGA 10 MG TABLET: 10 | 30 days supply | Qty: 30 | Fill #0

## 2020-10-23 MED FILL — POLYETHYLENE GLYCOL 3350 PO: 17 | 15 days supply | Qty: 510 | Fill #0

## 2020-10-23 MED FILL — BD PEN NDL NANO 32GX5/32: 32G X 4 MM | 30 days supply | Qty: 100 | Fill #0

## 2020-10-23 MED FILL — LEVEMIR FLEXTOUCH 100 UNITS: 100 | 30 days supply | Qty: 15 | Fill #0

## 2020-10-23 NOTE — Progress Notes (Signed)
Heart Failure Patient Advocate Encounter   Received notification from [facility] that prior authorization for Delene Loll is required.   PA submitted on CoverMyMeds Key [id number] Status is pending   Will continue to follow.  Kerby Nora, PharmD, BCPS Heart Failure Stewardship Pharmacist Phone [phone]  Please check [email or web address] for unit-specific pharmacist phone numbers

## 2020-10-23 NOTE — Progress Notes (Signed)
Heart Failure Stewardship Pharmacist Progress Note   PCP: Patient, No Pcp Per PCP-Cardiologist: [name], MD    HPI:  54 yo M with PMH of HTN, diabetes, HLD, hypothyroidism, bipolar disorder, paranoid schizophrenia, and tobacco abuse. He presented to the ED on 10/10/20 with worsening shortness of breath and has been admitted for acute CHF. ECHO was done on 10/11/20 and LVEF was severely decreased to 20-25%. R/LHC done on 10/14/20 with no apparent CAD, PAP 26, PCWP 26, CO 4.96, CI 2.09.  Current HF Medications: Furosemide 40 mg PO daily Metoprolol XL 50 mg daily Entresto 24/26 mg BID Spironolactone 25 mg daily Farxiga 10 mg daily Digoxin 0.125 mg daily  Prior to admission HF Medications: Metoprolol tartrate 25 mg BID Lisinopril 2.5 mg daily  Pertinent Lab Values: . Serum creatinine 1.14, BUN 22, Potassium 5.1, Sodium 132, BNP 97, Digoxin level 0.4  Vital Signs: . Weight: 275 lbs (admission weight: 281 lbs) . Blood pressure: 120/80s  . Heart rate: 100-120s   Medication Assistance / Insurance Benefits Check: Does the patient have prescription insurance?  Yes Type of insurance plan: [facility] Medicaid  Outpatient Pharmacy:  [facility] Is the patient willing to use [place] at discharge? Yes Is the patient willing to transition their outpatient pharmacy to utilize a [facility] outpatient pharmacy?   Pending    Assessment: 1. Acute systolic CHF (EF 60-60%), due to NICM. NYHA class II/III symptoms. - Continue furosemide 40 mg daily - Continue metoprolol XL 50 mg daily - Continue Entresto 24/26 mg BID - Continue spironolactone 25 mg daily - K+ elevated at 5.1 today, will recommend early follow-up with labs in one week to monitor closely  - Continue Farxiga 10 mg daily - Continue digoxin 0.125 mg daily. Digoxin level was 0.4 on 12/14. - Noted progressive hyponatremia, could consider tolvaptan if Na < 125.   Plan: 1)  Medication changes recommended at this time: - Agree with changes as noted above   2) Patient assistance application(s): - None pending, has Medicaid - Copays $3 and less - Prior authorization for [facility] submitted  - Prior authorization for [place] has been approved  3)  Education  - Will provide discharge counseling to patient today  Harriet Pho, PharmD PGY-1 Community Pharmacy Resident   10/23/2020 11:16 AM

## 2020-10-23 NOTE — Progress Notes (Signed)
Progress Note  Patient Name: STEELE [name] Date of Encounter: 10/23/2020  [facility] Cardiologist: [name], MD   Subjective   Net negative 500cc overnight - sodium improved at 132. Expect d/c today. HR improved with metoprolol, now around 100.  Inpatient Medications    Scheduled Meds: . atorvastatin  10 mg Oral Daily  . brimonidine  1 drop Both Eyes BID  . Chlorhexidine Gluconate Cloth  6 each Topical Daily  . cyclobenzaprine  10 mg Oral TID  . dapagliflozin propanediol  10 mg Oral Daily  . digoxin  0.125 mg Oral Daily  . enoxaparin (LOVENOX) injection  60 mg Subcutaneous Q24H  . feeding supplement (GLUCERNA SHAKE)  237 mL Oral TID BM  . furosemide  40 mg Oral Daily  . insulin aspart  0-15 Units Subcutaneous TID WC  . insulin detemir  28 Units Subcutaneous Daily  . latanoprost  1 drop Both Eyes QHS  . levothyroxine  100 mcg Oral Q0600  . melatonin  3 mg Oral QHS  . metoprolol succinate  50 mg Oral QHS  . polyethylene glycol  17 g Oral BID  . risperiDONE  0.5 mg Oral QHS  . sacubitril-valsartan  1 tablet Oral BID  . senna  2 tablet Oral BID  . sodium chloride flush  10-40 mL Intracatheter Q12H  . sodium chloride flush  3 mL Intravenous Q12H  . sodium chloride flush  3 mL Intravenous Q12H  . spironolactone  25 mg Oral Daily  . tamsulosin  0.4 mg Oral Daily   Continuous Infusions: . sodium chloride     PRN Meds: sodium chloride, acetaminophen **OR** acetaminophen, alum & mag hydroxide-simeth, guaiFENesin-codeine, magnesium hydroxide, ondansetron **OR** [DISCONTINUED] ondansetron (ZOFRAN) IV, sodium chloride flush, sodium chloride flush, sodium phosphate   Vital Signs    Vitals:   10/22/20 1602 10/22/20 2100 10/23/20 0000 10/23/20 0351  BP: 99/74 132/78 98/71 114/79  Pulse: (!) 117 (!) 114 (!) 103 (!) 108  Resp: 16 18  18   Temp: 98.1 F (36.7 C) 98.8 F (37.1 C) 97.7 F (36.5 C) 97.8 F (36.6 C)  TempSrc: Oral Oral Oral Oral  SpO2: 92% 93% 93%  92%  Weight:    124.7 kg  Height:        Intake/Output Summary (Last 24 hours) at 10/23/2020 0714 Last data filed at 10/23/2020 0600 Gross per 24 hour  Intake 420 ml  Output 925 ml  Net -505 ml   Last 3 Weights 10/23/2020 10/21/2020 10/20/2020  Weight (lbs) 275 lb 274 lb 9.6 oz 275 lb 3.2 oz  Weight (kg) 124.739 kg 124.558 kg 124.83 kg      Telemetry    Sinus tachycardia in 120's - personally reviewed  Physical Exam   General appearance: alert and no distress Neck: no carotid bruit, no JVD and thyroid not enlarged, symmetric, no tenderness/mass/nodules Lungs: clear to auscultation bilaterally Heart: regular tachycardia\ Abdomen: protuberant, tympanitic, hypoactive BS Extremities: extremities normal, atraumatic, no cyanosis or edema Pulses: 2+ and symmetric Skin: Skin color, texture, turgor normal. No rashes or lesions Neurologic: Grossly normal Psych: Pleasant  Labs    High Sensitivity Troponin:   Recent Labs  Lab 10/11/20 0254 10/11/20 0521  TROPONINIHS 28* 25*      Chemistry Recent Labs  Lab 10/21/20 1127 10/22/20 0925 10/23/20 0449  NA 131* 129* 132*  K 4.9 4.6 5.1  CL 90* 92* 96*  CO2 25 27 25   GLUCOSE 246* 275* 203*  BUN 27* 22* 22*  CREATININE 1.26* 1.21 1.14  CALCIUM 9.7 9.6 9.9  GFRNONAA >60 >60 >60  ANIONGAP 16* 10 11     Hematology Recent Labs  Lab 10/19/20 0523 10/23/20 0449  WBC 10.7* 10.3  RBC 6.24* 6.10*  HGB 15.6 15.7  HCT 48.3 46.6  MCV 77.4* 76.4*  MCH 25.0* 25.7*  MCHC 32.3 33.7  RDW 18.6* 18.1*  PLT 207 211    BNP No results for input(s): BNP, PROBNP in the last 168 hours.   Radiology    DG Abd 2 Views  Result Date: 10/21/2020 CLINICAL DATA:  Abdominal pain and distension EXAM: ABDOMEN - 2 VIEW COMPARISON:  CT abdomen and pelvis July 18, 2020 FINDINGS: Supine and upright images were obtained. There is moderate stool throughout the colon. There is no bowel dilatation or air-fluid level to suggest bowel  obstruction. No free air. Clips noted in prostate region. Lung bases clear. IMPRESSION: No bowel obstruction or free air.  Lung bases clear. Electronically Signed   By: Lowella Grip III M.D.   On: 10/21/2020 12:40    Patient Profile     54 y.o. 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  Echocardiogram this admission shows ejection fraction 20 to 25%, severe left ventricular enlargement.  Assessment & Plan    1 CHF:  Acute systolic - appears euvolemic. No CAD at cath PCWP 26 mmHg - coreg recently increased. Now 19L negative - continue oral lasix 40 mg daily. Continue Toprol XL 50 mg.  2 HLD -continue statin.  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 Further evaluation once cardiac status improves.  Management per primary care. He has f/u for oncology suspect he will need biopsy PET scan positive   4. Tachycardia - improved with Toprol - continue current dose.  5. Hyponatremia - seems to be improved today at 132.  6. Abdominal distention - continue bowel regimen.  Ok to d/c home today from cardiology standpoint. Follow-up with Dr. Harrell Gave after discharge.  [facility] will sign off.   Medication Recommendations:  Continue current meds Other recommendations (labs, testing, etc):  None Follow up as an outpatient:  Dr. Harrell Gave   For questions or updates, please contact [facility] Please consult [email or web address] for contact info under   Pixie Casino, MD, FACC, [place] Director of the [facility] Diplomate of the American Board of Clinical Lipidology Attending Cardiologist  Direct Dial: [phone]  Fax: [phone]  Website:   [email or web address]  Pixie Casino, MD  10/23/2020, 7:14 AM

## 2020-10-23 NOTE — TOC Transition Note (Signed)
Transition of Care [facility]) - CM/SW Discharge Note   Patient Details  Name: Charles [name] MRN: [id number] Date of Birth: [birth year]  [facility]) CM/SW Contact:  Trula Ore, [place] Phone Number: 10/23/2020, 12:19 PM   Clinical Narrative:     Patient will DC to: [facility]   Anticipated DC date: 10/23/2020  Family notified: Suanne Marker   Transport by: Safe Transport   ?  Per MD patient ready for DC to [facility]. RN, patient, patient's family, and facility notified of DC. Discharge Summary sent to facility. RN given number for report tele# [phone] RM# 9. DC packet on chart. Safe transport requested for patient.  CSW signing off.    Final next level of care: [facility]isted Living [facility]) Barriers to Discharge: No Barriers Identified   Patient Goals and CMS Choice Patient states their goals for this hospitalization and ongoing recovery are:: to go to ALF CMS Medicare.gov Compare Post Acute Care list provided to:: Patient Choice offered to / list presented to : Patient  Discharge Placement              Patient chooses bed at:  [facility]) Patient to be transferred to facility by: Safe Transport Name of family member notified: Suanne Marker Patient and family notified of of transfer: 10/23/20  Discharge Plan and Services                                     Social Determinants of Health ([place]) Interventions     Readmission Risk Interventions No flowsheet data found.

## 2020-10-23 NOTE — Discharge Summary (Signed)
Physician Discharge Summary  Charles [name]:[id number] DOB: January 03, 1966 DOA: 10/10/2020  PCP: Patient, No Pcp Per  Admit date: 10/10/2020 Discharge date: 10/23/2020  Admitted From: Assisted living facility Disposition: Assisted living facility  Recommendations for Outpatient Follow-up:  1. Follow up with PCP in 1-2 weeks 2. Please obtain BMP/CBC in one week 3. Follow-up with cardiology as a scheduled. 4. Is a scheduled to follow-up with oncology on 12/28 as per records that he will keep up.  Home Health: Not applicable Equipment/Devices: Not applicable  Discharge Condition: Stable CODE STATUS: Full code Diet recommendation: Low-salt, low-carb diet  Discharge summary:  [name] 54 year old gentleman with history of hypertension, type 2 diabetes on Metformin at home, obesity who presented to the ER from assisted living facility with complaints of shortness of breath.  He was recently suspected to have lymphoma due to metastatic lymphadenopathy and was planned to have biopsy by CT surgery.  In the emergency room, he was tachycardic, hypoxic needing 2 L oxygen.  Chest x-ray showed possible pulmonary edema.  CT angiogram did not show any pulmonary embolism but showed worsening mediastinal lymphadenopathy.  Echocardiogram showed severe reduction in ejection fraction 20%.  Treated as acute systolic congestive heart failure and followed by heart failure team.   Acute congestive heart failure with reduced ejection fraction/sinus tachycardia Echo showed ejection fraction of 20 to 25%.    Followed by cardiology. Treated with IV diuresis. Currently discharging on digoxin, Entresto, Aldactone, Lasix 40 mg daily, Toprol-XL. Underwent cardiac cath on 12/7, normal coronaries. Continue close monitoring of electrolytes.  Recheck renal functions in 1 week. He will have follow-up as scheduled by heart failure clinic. [name] currently on room air. Abdominal pain and distention improved with relief  of constipation.  Will use scheduled laxatives and stool softener.  Type 2 diabetes: Uncontrolled.  A1c 8.1.  On Metformin at home.  Is started on insulin and Farxiga in the hospital.  Will discharge patient on Levemir 25 units once a day along with [place].  Stop Metformin.  Hypothyroidism: Continue Synthroid.  Metastatic lymphadenopathy: Recently diagnosed with lymphadenopathy.  He has a scheduled follow-up.  [name] medically stabilized.  Seen by heart failure team and medications finalized.  Able to go back to assisted living facility today with outpatient follow-up.   Discharge Diagnoses:  Principal Problem:   Shortness of breath Active Problems:   Hypertension   Mediastinal lymphadenopathy   Sinus tachycardia   Night sweats   DM2 (diabetes mellitus, type 2) (HCC)   Lymphoma (HCC)   Acute systolic heart failure [facility])    Discharge Instructions  Discharge Instructions    Ambulatory referral to Nutrition and Diabetic Education   Complete by: As directed    Call MD for:  difficulty breathing, headache or visual disturbances   Complete by: As directed    Diet - low sodium heart healthy   Complete by: As directed    Diet Carb Modified   Complete by: As directed    Discharge instructions   Complete by: As directed    Check your blood sugars 2-3 times a day and keep a log book. Bring it to doctors visit. Keep up the cancer doctors appointment.   Increase activity slowly   Complete by: As directed      Allergies as of 10/23/2020      Reactions   Ibuprofen    Penicillins Rash      Medication List    STOP taking these medications   hydrochlorothiazide 25  MG tablet Commonly known as: HYDRODIURIL   lisinopril 2.5 MG tablet Commonly known as: ZESTRIL   metFORMIN 500 MG tablet Commonly known as: GLUCOPHAGE   metoprolol tartrate 25 MG tablet Commonly known as: LOPRESSOR     TAKE these medications   acetaminophen 325 MG tablet Commonly known as:  TYLENOL Take 650 mg by mouth every 6 (six) hours as needed.   atorvastatin 10 MG tablet Commonly known as: LIPITOR Take 10 mg by mouth daily.   blood glucose meter kit and supplies Kit Dispense based on patient and insurance preference. Use up to four times daily as directed. (FOR ICD-9 250.00, 250.01).   brimonidine 0.2 % ophthalmic solution Commonly known as: ALPHAGAN Place 1 drop into both eyes 2 (two) times daily.   cyclobenzaprine 10 MG tablet Commonly known as: FLEXERIL Take 10 mg by mouth 3 (three) times daily.   dapagliflozin propanediol 10 MG Tabs tablet Commonly known as: FARXIGA Take 1 tablet (10 mg total) by mouth daily.   digoxin 0.125 MG tablet Commonly known as: LANOXIN Take 1 tablet (0.125 mg total) by mouth daily.   furosemide 40 MG tablet Commonly known as: LASIX Take 1 tablet (40 mg total) by mouth daily.   insulin detemir 100 UNIT/ML FlexPen Commonly known as: LEVEMIR Inject 25 Units into the skin daily.   INVEGA SUSTENNA IM Inject 1 pen into the muscle every 30 (thirty) days.   latanoprost 0.005 % ophthalmic solution Commonly known as: XALATAN Place 1 drop into both eyes at bedtime.   levothyroxine 100 MCG tablet Commonly known as: SYNTHROID Take 100 mcg by mouth daily before breakfast.   melatonin 3 MG Tabs tablet Take 1 tablet by mouth at bedtime.   metoprolol succinate 50 MG 24 hr tablet Commonly known as: TOPROL-XL Take 1 tablet (50 mg total) by mouth at bedtime. Take with or immediately following a meal.   PEN NEEDLES 29GX1/2" 29G X 12MM Misc 1 each by Does not apply route daily.   polyethylene glycol 17 g packet Commonly known as: MIRALAX / GLYCOLAX Take 17 g by mouth 2 (two) times daily.   risperiDONE 0.5 MG tablet Commonly known as: RISPERDAL Take 0.5 mg by mouth at bedtime.   sacubitril-valsartan 24-26 MG Commonly known as: ENTRESTO Take 1 tablet by mouth 2 (two) times daily.   senna 8.6 MG Tabs tablet Commonly known  as: SENOKOT Take 2 tablets (17.2 mg total) by mouth 2 (two) times daily.   spironolactone 25 MG tablet Commonly known as: ALDACTONE Take 1 tablet (25 mg total) by mouth daily.   tamsulosin 0.4 MG Caps capsule Commonly known as: FLOMAX Take 0.4 mg by mouth daily.       Follow-up Information    [name], MD Follow up in 2 week(s).   Specialty: Cardiology Contact information: [address] [place] [address] [phone]              Allergies  Allergen Reactions  . Ibuprofen   . Penicillins Rash    Consultations:  Cardiology   Procedures/Studies: CT Angio Chest PE W and/or Wo Contrast  Result Date: 10/10/2020 CLINICAL DATA:  PE suspected.  Increasing shortness of breath. EXAM: CT ANGIOGRAPHY CHEST WITH CONTRAST TECHNIQUE: Multidetector CT imaging of the chest was performed using the standard protocol during bolus administration of intravenous contrast. Multiplanar CT image reconstructions and MIPs were obtained to evaluate the vascular anatomy. CONTRAST:  169m OMNIPAQUE IOHEXOL 350 MG/ML SOLN COMPARISON:  07/18/2020 FINDINGS: Cardiovascular: Contrast injection  is sufficient to demonstrate satisfactory opacification of the pulmonary arteries to the segmental level. There is no pulmonary embolus or evidence of right heart strain. The size of the main pulmonary artery is normal. Moderate cardiomegaly. The course and caliber of the aorta are normal. There is no atherosclerotic calcification. Opacification decreased due to pulmonary arterial phase contrast bolus timing. Mediastinum/Nodes: --again noted is mediastinal adenopathy which has increased in size from prior study. For example the dominant lymph node now measures 2.5 cm (previously measuring approximately 2.1 cm. -- No hilar lymphadenopathy. -- No axillary lymphadenopathy. -- No supraclavicular lymphadenopathy. -- Normal thyroid gland where visualized. -  Unremarkable esophagus. Lungs/Pleura:  Airways are patent. No pleural effusion, lobar consolidation, pneumothorax or pulmonary infarction. Upper Abdomen: Contrast bolus timing is not optimized for evaluation of the abdominal organs. The visualized portions of the organs of the upper abdomen are normal. Musculoskeletal: No chest wall abnormality. No bony spinal canal stenosis. Stable postsurgical changes are noted of the midthoracic spine. Review of the MIP images confirms the above findings. IMPRESSION: 1. No evidence of acute pulmonary embolus. 2. Moderate cardiomegaly. 3. Worsening mediastinal adenopathy as previously evaluated on the patient's PET-CT. Electronically Signed   By: Constance Holster M.D.   On: 10/10/2020 23:01   CARDIAC CATHETERIZATION  Result Date: 74/11/2876  LV end diastolic pressure is severely elevated. LVEDP 29 mm Hg.  There is no aortic valve stenosis.  No angiographically apparent CAD.  Ao sat 98%, PA sat 69%, PA pressure 34/22, mean 26 mm Hg; mean PCWP 26 mm Hg; CO 4.96 L/min; CI 2.09  Continue medical therapy for nonischemic cardiomyopathy.   US Abdomen Limited  Result Date: 10/11/2020 CLINICAL DATA:  Possible ascites EXAM: LIMITED ABDOMEN ULTRASOUND FOR ASCITES TECHNIQUE: Limited ultrasound survey for ascites was performed in all four abdominal quadrants. COMPARISON:  None. FINDINGS: No evidence of ascites. IMPRESSION: No ascites is identified. Electronically Signed   By: Inez Catalina M.D.   On: 10/11/2020 12:18   DG Chest Port 1 View  Result Date: 10/10/2020 CLINICAL DATA:  54 year old male with shortness of breath. EXAM: PORTABLE CHEST 1 VIEW COMPARISON:  Chest radiograph dated 01/06/2007. FINDINGS: Cardiomegaly with vascular congestion. No focal consolidation, pleural effusion or pneumothorax. No acute osseous pathology. IMPRESSION: Cardiomegaly with vascular congestion. No focal consolidation. Electronically Signed   By: Anner Crete M.D.   On: 10/10/2020 21:20   DG Abd 2 Views  Result Date:  10/21/2020 CLINICAL DATA:  Abdominal pain and distension EXAM: ABDOMEN - 2 VIEW COMPARISON:  CT abdomen and pelvis July 18, 2020 FINDINGS: Supine and upright images were obtained. There is moderate stool throughout the colon. There is no bowel dilatation or air-fluid level to suggest bowel obstruction. No free air. Clips noted in prostate region. Lung bases clear. IMPRESSION: No bowel obstruction or free air.  Lung bases clear. Electronically Signed   By: Lowella Grip III M.D.   On: 10/21/2020 12:40   ECHOCARDIOGRAM COMPLETE  Result Date: 10/11/2020    ECHOCARDIOGRAM REPORT   Patient Name:   WEBSTER PATRONE Date of Exam: 10/11/2020 Medical Rec #:  [id number]   Height:       69.0 in Accession #:    [id number]  Weight:       281.3 lb Date of Birth:  [birth year]    BSA:          2.388 m Patient Age:    1 years    BP:  146/92 mmHg Patient Gender: M           HR:           120 bpm. Exam Location:  Inpatient Procedure: 2D Echo  Results reported to Dr Tawanna Solo. Indications:    CHF 428  History:        Patient has no prior history of Echocardiogram examinations.                 Risk Factors:Diabetes, Hypertension and Current Smoker.  Sonographer:    Jannett Celestine RDCS (AE) Referring Phys: [id number] JARED M GARDNER  Sonographer Comments: [name] morbidly obese. Image acquisition challenging due to patient body habitus. patient had difficulty remaining awake during exam IMPRESSIONS  1. Left ventricular ejection fraction, by estimation, is 20 to 25%. The left ventricle has severely decreased function. The left ventricle demonstrates global hypokinesis. The left ventricular internal cavity size was severely dilated. Left ventricular diastolic parameters are indeterminate.  2. Right ventricular systolic function was not well visualized. The right ventricular size is not well visualized.  3. The mitral valve is normal in structure. No evidence of mitral valve regurgitation.  4. The aortic valve was not well  visualized. Aortic valve regurgitation is not visualized. No aortic stenosis is present. FINDINGS  Left Ventricle: Left ventricular ejection fraction, by estimation, is 20 to 25%. The left ventricle has severely decreased function. The left ventricle demonstrates global hypokinesis. Definity contrast agent was given IV to delineate the left ventricular endocardial borders. The left ventricular internal cavity size was severely dilated. There is no left ventricular hypertrophy. Left ventricular diastolic parameters are indeterminate. Right Ventricle: The right ventricular size is not well visualized. Right vetricular wall thickness was not assessed. Right ventricular systolic function was not well visualized. Left Atrium: Left atrial size was not well visualized. Right Atrium: Right atrial size was not well visualized. Pericardium: There is no evidence of pericardial effusion. Mitral Valve: The mitral valve is normal in structure. No evidence of mitral valve regurgitation. Tricuspid Valve: The tricuspid valve is normal in structure. Tricuspid valve regurgitation is trivial. Aortic Valve: The aortic valve was not well visualized. Aortic valve regurgitation is not visualized. No aortic stenosis is present. Pulmonic Valve: The pulmonic valve was not assessed. Pulmonic valve regurgitation is not visualized. Aorta: The aortic root is normal in size and structure. IAS/Shunts: The interatrial septum was not well visualized.  LEFT VENTRICLE PLAX 2D LVIDd:         7.00 cm LVIDs:         6.05 cm LV PW:         0.90 cm LV IVS:        0.90 cm LVOT diam:     1.90 cm LV SV:         25 LV SV Index:   10 LVOT Area:     2.84 cm  LEFT ATRIUM         Index LA diam:    3.30 cm 1.38 cm/m  AORTIC VALVE LVOT Vmax:   68.50 cm/s LVOT Vmean:  46.200 cm/s LVOT VTI:    0.087 m  AORTA Ao Root diam: 3.20 cm MITRAL VALVE MV Area (PHT): 6.54 cm    SHUNTS MV Decel Time: 116 msec    Systemic VTI:  0.09 m MV E velocity: 73.70 cm/s  Systemic Diam:  1.90 cm Oswaldo Milian MD Electronically signed by Oswaldo Milian MD Signature Date/Time: 10/11/2020/4:44:36 PM    Final    [place] EKG  SITE RITE  Result Date: 10/16/2020 If Site Rite image not attached, placement could not be confirmed due to current cardiac rhythm.  (Echo, Carotid, EGD, Colonoscopy, ERCP)    Subjective: Patient seen and examined.  No overnight events.  Poor historian.  Ambulated around on room air.   Discharge Exam: Vitals:   10/23/20 0855 10/23/20 0900  BP: 130/74 130/74  Pulse: (!) 111   Resp: 18   Temp: 98.1 F (36.7 C)   SpO2: 94% 92%   Vitals:   10/23/20 0000 10/23/20 0351 10/23/20 0855 10/23/20 0900  BP: 98/71 114/79 130/74 130/74  Pulse: (!) 103 (!) 108 (!) 111   Resp:  18 18   Temp: 97.7 F (36.5 C) 97.8 F (36.6 C) 98.1 F (36.7 C)   TempSrc: Oral Oral Oral   SpO2: 93% 92% 94% 92%  Weight:  124.7 kg    Height:        General: Pt is alert, awake, not in acute distress Cardiovascular: RRR, S1/S2 +, no rubs, no gallops Respiratory: CTA bilaterally, no wheezing, no rhonchi Abdominal: Soft, NT, ND, bowel sounds +, obese and pendulous. Extremities: no edema, no cyanosis    The results of significant diagnostics from this hospitalization (including imaging, microbiology, ancillary and laboratory) are listed below for reference.     Microbiology: Recent Results (from the past 240 hour(s))  SARS Coronavirus 2 by RT PCR (hospital order, performed in [facility] hospital lab) Nasopharyngeal Nasopharyngeal Swab     Status: None   Collection Time: 10/22/20  2:30 PM   Specimen: Nasopharyngeal Swab  Result Value Ref Range Status   SARS Coronavirus 2 NEGATIVE NEGATIVE Final    Comment: (NOTE) SARS-CoV-2 target nucleic acids are NOT DETECTED.  The SARS-CoV-2 RNA is generally detectable in upper and lower respiratory specimens during the acute phase of infection. The lowest concentration of SARS-CoV-2 viral copies this assay can detect is  250 copies / mL. A negative result does not preclude SARS-CoV-2 infection and should not be used as the sole basis for treatment or other patient management decisions.  A negative result may occur with improper specimen collection / handling, submission of specimen other than nasopharyngeal swab, presence of viral mutation(s) within the areas targeted by this assay, and inadequate number of viral copies (<250 copies / mL). A negative result must be combined with clinical observations, patient history, and epidemiological information.  Fact Sheet for Patients:   StrictlyIdeas.no  Fact Sheet for Healthcare Providers: BankingDealers.co.za  This test is not yet approved or  cleared by the [place] FDA and has been authorized for detection and/or diagnosis of SARS-CoV-2 by FDA under an Emergency Use Authorization (EUA).  This EUA will remain in effect (meaning this test can be used) for the duration of the COVID-19 declaration under Section 564(b)(1) of the Act, 21 U.S.C. section 360bbb-3(b)(1), unless the authorization is terminated or revoked sooner.  Performed at [facility], [place] [address] [place], [place] [address]      Labs: BNP (last 3 results) Recent Labs    10/10/20 2102  BNP 97.3   Basic Metabolic Panel: Recent Labs  Lab 10/20/20 0500 10/21/20 0555 10/21/20 1127 10/22/20 0925 10/23/20 0449  NA 130* 122* 131* 129* 132*  K 4.7 4.7 4.9 4.6 5.1  CL 92* 86* 90* 92* 96*  CO2 26 26 25 27 25   GLUCOSE 263* 219* 246* 275* 203*  BUN 24* 22* 27* 22* 22*  CREATININE 1.18 1.21 1.26* 1.21 1.14  CALCIUM 9.2 8.6* 9.7 9.6 9.9   Liver Function Tests: No results for input(s): AST, ALT, ALKPHOS, BILITOT, PROT, ALBUMIN in the last 168 hours. No results for input(s): LIPASE, AMYLASE in the last 168 hours. No results for input(s): AMMONIA in the last 168 hours. CBC: Recent Labs  Lab 10/19/20 0523 10/23/20 0449  WBC  10.7* 10.3  HGB 15.6 15.7  HCT 48.3 46.6  MCV 77.4* 76.4*  PLT 207 211   Cardiac Enzymes: No results for input(s): CKTOTAL, CKMB, CKMBINDEX, TROPONINI in the last 168 hours. BNP: Invalid input(s): POCBNP CBG: Recent Labs  Lab 10/21/20 2148 10/22/20 0736 10/22/20 1121 10/22/20 1639 10/23/20 0854  GLUCAP 259* 215* 227* 166* 244*   D-Dimer No results for input(s): DDIMER in the last 72 hours. Hgb A1c No results for input(s): HGBA1C in the last 72 hours. Lipid Profile No results for input(s): CHOL, HDL, LDLCALC, TRIG, CHOLHDL, LDLDIRECT in the last 72 hours. Thyroid function studies No results for input(s): TSH, T4TOTAL, T3FREE, THYROIDAB in the last 72 hours.  Invalid input(s): FREET3 Anemia work up No results for input(s): VITAMINB12, FOLATE, FERRITIN, TIBC, IRON, RETICCTPCT in the last 72 hours. Urinalysis    Component Value Date/Time   COLORURINE YELLOW 10/19/2020 1805   APPEARANCEUR CLEAR 10/19/2020 1805   LABSPEC 1.018 10/19/2020 1805   PHURINE 5.0 10/19/2020 1805   GLUCOSEU 150 (A) 10/19/2020 1805   HGBUR NEGATIVE 10/19/2020 1805   BILIRUBINUR NEGATIVE 10/19/2020 1805   KETONESUR NEGATIVE 10/19/2020 1805   PROTEINUR NEGATIVE 10/19/2020 1805   UROBILINOGEN 1.0 11/09/2011 1522   NITRITE NEGATIVE 10/19/2020 1805   LEUKOCYTESUR NEGATIVE 10/19/2020 1805   Sepsis Labs Invalid input(s): PROCALCITONIN,  WBC,  LACTICIDVEN Microbiology Recent Results (from the past 240 hour(s))  SARS Coronavirus 2 by RT PCR (hospital order, performed in [facility] hospital lab) Nasopharyngeal Nasopharyngeal Swab     Status: None   Collection Time: 10/22/20  2:30 PM   Specimen: Nasopharyngeal Swab  Result Value Ref Range Status   SARS Coronavirus 2 NEGATIVE NEGATIVE Final    Comment: (NOTE) SARS-CoV-2 target nucleic acids are NOT DETECTED.  The SARS-CoV-2 RNA is generally detectable in upper and lower respiratory specimens during the acute phase of infection. The  lowest concentration of SARS-CoV-2 viral copies this assay can detect is 250 copies / mL. A negative result does not preclude SARS-CoV-2 infection and should not be used as the sole basis for treatment or other patient management decisions.  A negative result may occur with improper specimen collection / handling, submission of specimen other than nasopharyngeal swab, presence of viral mutation(s) within the areas targeted by this assay, and inadequate number of viral copies (<250 copies / mL). A negative result must be combined with clinical observations, patient history, and epidemiological information.  Fact Sheet for Patients:   StrictlyIdeas.no  Fact Sheet for Healthcare Providers: BankingDealers.co.za  This test is not yet approved or  cleared by the [place] FDA and has been authorized for detection and/or diagnosis of SARS-CoV-2 by FDA under an Emergency Use Authorization (EUA).  This EUA will remain in effect (meaning this test can be used) for the duration of the COVID-19 declaration under Section 564(b)(1) of the Act, 21 U.S.C. section 360bbb-3(b)(1), unless the authorization is terminated or revoked sooner.  Performed at [facility], [place] [address] [place], [place] [address]      Time coordinating discharge: 32 minutes  SIGNED:   Barb Merino, MD  [facility] 10/23/2020, 11:22 AM

## 2020-10-24 ENCOUNTER — Telehealth (HOSPITAL_COMMUNITY): Payer: Self-pay

## 2020-10-24 NOTE — Telephone Encounter (Signed)
Prior authorization for Charles [name] was denied. Will begin appeal process.

## 2020-10-27 ENCOUNTER — Ambulatory Visit (HOSPITAL_COMMUNITY): Payer: Medicaid Other | Admitting: Licensed Clinical Social Worker

## 2020-10-28 ENCOUNTER — Emergency Department (HOSPITAL_COMMUNITY)
Admission: EM | Admit: 2020-10-28 | Discharge: 2020-11-03 | Disposition: A | Discharge status: Home / Self Care | Payer: Medicaid Other | Attending: Emergency Medicine | Admitting: Emergency Medicine

## 2020-10-28 ENCOUNTER — Encounter (HOSPITAL_COMMUNITY): Payer: Self-pay

## 2020-10-28 ENCOUNTER — Emergency Department (HOSPITAL_COMMUNITY): Payer: Medicaid Other

## 2020-10-28 ENCOUNTER — Other Ambulatory Visit: Payer: Self-pay

## 2020-10-28 DIAGNOSIS — Z20822 Contact with and (suspected) exposure to covid-19: Secondary | ICD-10-CM | POA: Diagnosis not present

## 2020-10-28 DIAGNOSIS — R440 Auditory hallucinations: Secondary | ICD-10-CM | POA: Insufficient documentation

## 2020-10-28 DIAGNOSIS — Z79899 Other long term (current) drug therapy: Secondary | ICD-10-CM | POA: Diagnosis not present

## 2020-10-28 DIAGNOSIS — F4323 Adjustment disorder with mixed anxiety and depressed mood: Secondary | ICD-10-CM | POA: Diagnosis present

## 2020-10-28 DIAGNOSIS — E119 Type 2 diabetes mellitus without complications: Secondary | ICD-10-CM | POA: Diagnosis not present

## 2020-10-28 DIAGNOSIS — F329 Major depressive disorder, single episode, unspecified: Secondary | ICD-10-CM | POA: Diagnosis not present

## 2020-10-28 DIAGNOSIS — I110 Hypertensive heart disease with heart failure: Secondary | ICD-10-CM | POA: Insufficient documentation

## 2020-10-28 DIAGNOSIS — Z7984 Long term (current) use of oral hypoglycemic drugs: Secondary | ICD-10-CM | POA: Diagnosis not present

## 2020-10-28 DIAGNOSIS — F17210 Nicotine dependence, cigarettes, uncomplicated: Secondary | ICD-10-CM | POA: Insufficient documentation

## 2020-10-28 DIAGNOSIS — R45851 Suicidal ideations: Secondary | ICD-10-CM

## 2020-10-28 DIAGNOSIS — Z794 Long term (current) use of insulin: Secondary | ICD-10-CM | POA: Insufficient documentation

## 2020-10-28 DIAGNOSIS — I5021 Acute systolic (congestive) heart failure: Secondary | ICD-10-CM | POA: Diagnosis not present

## 2020-10-28 LAB — CBC
HCT: 48.9 % (ref 39.0–52.0)
Hemoglobin: 16.1 g/dL (ref 13.0–17.0)
MCH: 25.8 pg — ABNORMAL LOW (ref 26.0–34.0)
MCHC: 32.9 g/dL (ref 30.0–36.0)
MCV: 78.4 fL — ABNORMAL LOW (ref 80.0–100.0)
Platelets: 280 10*3/uL (ref 150–400)
RBC: 6.24 MIL/uL — ABNORMAL HIGH (ref 4.22–5.81)
RDW: 18.6 % — ABNORMAL HIGH (ref 11.5–15.5)
WBC: 12.5 10*3/uL — ABNORMAL HIGH (ref 4.0–10.5)
nRBC: 0 % (ref 0.0–0.2)

## 2020-10-28 LAB — COMPREHENSIVE METABOLIC PANEL
ALT: 62 U/L — ABNORMAL HIGH (ref 0–44)
AST: 31 U/L (ref 15–41)
Albumin: 4.6 g/dL (ref 3.5–5.0)
Alkaline Phosphatase: 120 U/L (ref 38–126)
Anion gap: 14 (ref 5–15)
BUN: 19 mg/dL (ref 6–20)
CO2: 20 mmol/L — ABNORMAL LOW (ref 22–32)
Calcium: 9.7 mg/dL (ref 8.9–10.3)
Chloride: 102 mmol/L (ref 98–111)
Creatinine, Ser: 1.28 mg/dL — ABNORMAL HIGH (ref 0.61–1.24)
GFR, Estimated: >60 mL/min (ref >60)
Glucose, Bld: 137 mg/dL — ABNORMAL HIGH (ref 70–99)
Potassium: 4.3 mmol/L (ref 3.5–5.1)
Sodium: 136 mmol/L (ref 135–145)
Total Bilirubin: 0.6 mg/dL (ref 0.3–1.2)
Total Protein: 8.2 g/dL — ABNORMAL HIGH (ref 6.5–8.1)

## 2020-10-28 LAB — ACETAMINOPHEN LEVEL: Acetaminophen (Tylenol), Serum: <10 ug/mL — ABNORMAL LOW (ref 10–30)

## 2020-10-28 LAB — RESP PANEL BY RT-PCR (FLU A&B, COVID) ARPGX2
Influenza A by PCR: NEGATIVE
Influenza B by PCR: NEGATIVE
SARS Coronavirus 2 by RT PCR: NEGATIVE

## 2020-10-28 LAB — SALICYLATE LEVEL: Salicylate Lvl: <7 mg/dL — ABNORMAL LOW (ref 7.0–30.0)

## 2020-10-28 LAB — ETHANOL: Alcohol, Ethyl (B): <10 mg/dL (ref <10)

## 2020-10-28 MED ORDER — SACUBITRIL-VALSARTAN 24-26 MG PO TABS
1.0000 | ORAL_TABLET | Freq: Two times a day (BID) | ORAL | Status: DC
  Administered 2020-10-29 – 2020-11-03 (×10): 1 via ORAL
  Filled 2020-10-28 (×12): qty 1

## 2020-10-28 MED ORDER — LATANOPROST 0.005 % OP SOLN
1.0000 [drp] | Freq: Every day | OPHTHALMIC | Status: DC
  Administered 2020-10-29 – 2020-11-02 (×3): 1 [drp] via OPHTHALMIC
  Filled 2020-10-28 (×3): qty 2.5

## 2020-10-28 MED ORDER — CYCLOBENZAPRINE HCL 10 MG PO TABS
10.0000 mg | ORAL_TABLET | Freq: Three times a day (TID) | ORAL | Status: DC
  Administered 2020-10-29 – 2020-11-03 (×16): 10 mg via ORAL
  Filled 2020-10-28 (×17): qty 1

## 2020-10-28 MED ORDER — DAPAGLIFLOZIN PROPANEDIOL 10 MG PO TABS
10.0000 mg | ORAL_TABLET | Freq: Every day | ORAL | Status: DC
  Administered 2020-10-29 – 2020-11-03 (×6): 10 mg via ORAL
  Filled 2020-10-28 (×6): qty 1

## 2020-10-28 MED ORDER — RISPERIDONE 0.5 MG PO TABS
0.5000 mg | ORAL_TABLET | Freq: Every day | ORAL | Status: DC
  Administered 2020-10-29 – 2020-11-02 (×6): 0.5 mg via ORAL
  Filled 2020-10-28 (×6): qty 1

## 2020-10-28 MED ORDER — FUROSEMIDE 40 MG PO TABS
40.0000 mg | ORAL_TABLET | Freq: Every day | ORAL | Status: DC
  Administered 2020-10-29 – 2020-11-03 (×6): 40 mg via ORAL
  Filled 2020-10-28 (×6): qty 1

## 2020-10-28 MED ORDER — POLYETHYLENE GLYCOL 3350 17 G PO PACK
17.0000 g | PACK | Freq: Two times a day (BID) | ORAL | Status: DC
  Administered 2020-10-29 – 2020-11-03 (×7): 17 g via ORAL
  Filled 2020-10-28 (×10): qty 1

## 2020-10-28 MED ORDER — DIGOXIN 125 MCG PO TABS
0.1250 mg | ORAL_TABLET | Freq: Every day | ORAL | Status: DC
  Administered 2020-10-29 – 2020-11-03 (×6): 0.125 mg via ORAL
  Filled 2020-10-28 (×6): qty 1

## 2020-10-28 MED ORDER — LEVOTHYROXINE SODIUM 100 MCG PO TABS
100.0000 ug | ORAL_TABLET | Freq: Every day | ORAL | Status: DC
  Administered 2020-10-29 – 2020-11-03 (×6): 100 ug via ORAL
  Filled 2020-10-28 (×7): qty 1

## 2020-10-28 MED ORDER — MELATONIN 3 MG PO TABS
3.0000 mg | ORAL_TABLET | Freq: Every day | ORAL | Status: DC
  Administered 2020-10-29 – 2020-11-02 (×6): 3 mg via ORAL
  Filled 2020-10-28 (×6): qty 1

## 2020-10-28 MED ORDER — SENNA 8.6 MG PO TABS
2.0000 | ORAL_TABLET | Freq: Two times a day (BID) | ORAL | Status: DC
  Administered 2020-10-29 – 2020-11-03 (×12): 17.2 mg via ORAL
  Filled 2020-10-28 (×12): qty 2

## 2020-10-28 MED ORDER — ATORVASTATIN CALCIUM 10 MG PO TABS
10.0000 mg | ORAL_TABLET | Freq: Every day | ORAL | Status: DC
  Administered 2020-10-29 – 2020-11-03 (×6): 10 mg via ORAL
  Filled 2020-10-28 (×6): qty 1

## 2020-10-28 MED ORDER — TAMSULOSIN HCL 0.4 MG PO CAPS
0.4000 mg | ORAL_CAPSULE | Freq: Every day | ORAL | Status: DC
  Administered 2020-10-29 – 2020-11-03 (×6): 0.4 mg via ORAL
  Filled 2020-10-28 (×6): qty 1

## 2020-10-28 MED ORDER — SPIRONOLACTONE 25 MG PO TABS
25.0000 mg | ORAL_TABLET | Freq: Every day | ORAL | Status: DC
  Administered 2020-10-29 – 2020-11-03 (×6): 25 mg via ORAL
  Filled 2020-10-28 (×6): qty 1

## 2020-10-28 MED ORDER — METOPROLOL SUCCINATE ER 50 MG PO TB24
50.0000 mg | ORAL_TABLET | Freq: Every day | ORAL | Status: DC
  Administered 2020-10-29 – 2020-11-02 (×6): 50 mg via ORAL
  Filled 2020-10-28 (×6): qty 1

## 2020-10-28 MED ORDER — INSULIN DETEMIR 100 UNIT/ML ~~LOC~~ SOLN
25.0000 [IU] | Freq: Every day | SUBCUTANEOUS | Status: DC
  Administered 2020-10-29 – 2020-11-03 (×6): 25 [IU] via SUBCUTANEOUS
  Filled 2020-10-28 (×6): qty 0.25

## 2020-10-28 MED ORDER — "PEN NEEDLES 1/2"" 29G X 12MM MISC": 1.0000 | Freq: Every day | Status: DC

## 2020-10-28 NOTE — BH Assessment (Signed)
Comprehensive Clinical Assessment (CCA) Note  10/28/2020 Charles [name] [id number]   Charles [name] is a 54 year old male presenting voluntarily to [facility] due to [place] with plan to "walk in front of road". Patient reported onset of SI was 3-4 months ago. Patient reported stressors and triggers including, medical problems and moving into [facility], which he does not like. Patient was residing at [facility], which he liked, however all the tenants had to move out due to closing down for renovations. Patient reported worsening depressive symptoms. Patient reported psych hospitalizations, timeframe unknown by patient, per chart last was 2012. Patient reported "a few" attempted overdoses "a long time ago". Patient denied self-harming behaviors. Patient reported sleeping daytime and nighttime and appetite includes special diet due heart medical problems.   Patient denied receiving any outpatient mental health services at this time. Patient reported not being on psych medications for the past 6-7 months and stated when he was on psych medications they were working. Patient reported having support from ex-girlfriend. Patient denied access to guns. Patient was cooperative during assessment.  Disposition Lindon Romp, NP, patient meets inpatient criteria. Maudie Mercury, [facility], currently in review for appropriate bed at [facility].   Chief Complaint:  Chief Complaint  Patient presents with  . Suicidal   Visit Diagnosis: Major depressive disorder  CCA Biopsychosocial Intake/Chief Complaint:  SI with plan to walk in traffic.  Current Symptoms/Problems: Worsening depressive symptoms   Patient Reported Schizophrenia/Schizoaffective Diagnosis in Past: No data recorded  Strengths: self-awareness  Preferences: No data recorded Abilities: No data recorded  Type of Services Patient Feels are Needed: No data recorded  Initial Clinical Notes/Concerns: No data recorded  Mental Health Symptoms Depression:   Sleep (too much or little); Change in energy/activity; Fatigue; Worthlessness; Hopelessness; Irritability   Duration of Depressive symptoms: Greater than two weeks   Mania:  None   Anxiety:   Fatigue; Restlessness; Sleep; Tension; Worrying   Psychosis:  None   Duration of Psychotic symptoms: No data recorded  Trauma:  None   Obsessions:  None   Compulsions:  None   Inattention:  None   Hyperactivity/Impulsivity:  N/A   Oppositional/Defiant Behaviors:  None   Emotional Irregularity:  None   Other Mood/Personality Symptoms:  No data recorded   Mental Status Exam Appearance and self-care  Stature:  Average   Weight:  Average weight   Clothing:  Age-appropriate   Grooming:  Normal   Cosmetic use:  Age appropriate   Posture/gait:  Normal   Motor activity:  Not Remarkable   Sensorium  Attention:  Normal   Concentration:  Normal   Orientation:  X5   Recall/memory:  Normal   Affect and Mood  Affect:  Anxious; Appropriate; Depressed   Mood:  Anxious; Depressed; Worthless; Hopeless   Relating  Eye contact:  Normal   Facial expression:  Sad; Anxious   Attitude toward examiner:  Cooperative   Thought and Language  Speech flow: Normal   Thought content:  Appropriate to Mood and Circumstances   Preoccupation:  None   Hallucinations:  Auditory (heard a horse but did not see a horse)   Organization:  No data recorded  [employer] of Knowledge:  Average   Intelligence:  Average   Abstraction:  No data recorded  Judgement:  Poor   Reality Testing:  No data recorded  Insight:  Fair   Decision Making:  Impulsive   Social Functioning  Social Maturity:  No data recorded  Social Judgement:  No data recorded  Stress  Stressors:  Transitions; Housing; Other (Comment) (medical)   Coping Ability:  Exhausted; Overwhelmed   Skill Deficits:  Self-control; Responsibility; Decision making   Supports:  Friends/Service system      Religion:    Leisure/Recreation:    Exercise/Diet: Exercise/Diet Do You Exercise?: Yes What Type of Exercise Do You Do?: Other (Comment) Have You Gained or Lost A Significant Amount of Weight in the Past Six Months?: No Do You Follow a Special Diet?: Yes Type of Diet: special diet due to heart condition Do You Have Any Trouble Sleeping?: No   CCA Employment/Education Employment/Work Situation: Employment / Work Situation Employment situation: On disability How long has patient been on disability: uta Has patient ever been in the [employer]?: No  Education: Education Is Patient Currently Attending School?: No Did Teacher, 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   CCA Family/Childhood History Family and Relationship History: Family history Does patient have children?:  (uta)  Childhood History:  Childhood History Did patient suffer any verbal/emotional/physical/sexual abuse as a child?: No Did patient suffer from severe childhood neglect?: No Has patient ever been sexually abused/assaulted/raped as an adolescent or adult?: No Was the patient ever a victim of a crime or a disaster?: No Witnessed domestic violence?: No Has patient been affected by domestic violence as an adult?: No  Child/Adolescent Assessment:     CCA Substance Use Alcohol/Drug Use: Alcohol / Drug Use Pain Medications: see MAR Prescriptions: see MAR Over the Counter: see MAR History of alcohol / drug use?: No history of alcohol / drug abuse                         ASAM's:  Six Dimensions of Multidimensional Assessment  Dimension 1:  Acute Intoxication and/or Withdrawal Potential:      Dimension 2:  Biomedical Conditions and Complications:      Dimension 3:  Emotional, Behavioral, or Cognitive Conditions and Complications:     Dimension 4:  Readiness to Change:     Dimension 5:  Relapse, Continued use, or Continued Problem Potential:     Dimension 6:  Recovery/Living Environment:      ASAM Severity Score:    ASAM Recommended Level of Treatment:     Substance use Disorder (SUD)    Recommendations for Services/Supports/Treatments:    DSM5 Diagnoses: Patient Active Problem List   Diagnosis Date Noted  . Acute systolic heart failure ([place])   . Mediastinal lymphadenopathy 10/11/2020  . Shortness of breath 10/11/2020  . Sinus tachycardia 10/11/2020  . Night sweats 10/11/2020  . DM2 (diabetes mellitus, type 2) ([place]) 10/11/2020  . Lymphoma ([place]) 10/11/2020  . Hypertension 09/25/2020  . BPH (benign prostatic hyperplasia) 09/25/2020  . Insomnia 09/25/2020    Patient Centered Plan: [name] on the following Treatment Plan(s):   Referrals to Alternative Service(s): Referred to Alternative Service(s):   Place:   Date:   Time:    Referred to Alternative Service(s):   Place:   Date:   Time:    Referred to Alternative Service(s):   Place:   Date:   Time:    Referred to Alternative Service(s):   Place:   Date:   Time:     Venora Maples, [facility]

## 2020-10-28 NOTE — ED Triage Notes (Signed)
Patient was brought in by [employer]. Patient states he is suicidal, but does not have a plan. Patient denies HI, visual hallucinations, alcohol or drug use.  Patient states he Keeps "hearing a horse, but there is no horse."  Patient states he was recently hospitalized "for fluid around his heart."

## 2020-10-28 NOTE — ED Provider Notes (Signed)
[facility] DEPT Provider Note   CSN: [id number] Arrival date & time: 10/28/20  1346     History Chief Complaint  Patient presents with  . Suicidal    Charles [name] is a 54 y.o. male presenting for suicidal thoughts.  Patient states he has been out of his medication for 6 to 7 months.  He reports gradually worsening suicidal thoughts.  He reports a history of bipolar, depression, and schizophrenia.  He states that he did not come to the hospital today, he was going to sit in the middle of the road and wait for car to hit him.  He denies homicidal thoughts.  He does report hearing a horse earlier, but knew that there was no horse and knew he was hallucinating.  He denies fevers, chills, cough, chest pain, shortness of breath, nausea vomiting abdominal pain, urinary symptoms, abnormal bowel movements  HPI     Past Medical History:  Diagnosis Date  . Bipolar 1 disorder ([place])   . DM2 (diabetes mellitus, type 2) ([place])   . HTN (hypertension)     Patient Active Problem List   Diagnosis Date Noted  . Acute systolic heart failure ([place])   . Mediastinal lymphadenopathy 10/11/2020  . Shortness of breath 10/11/2020  . Sinus tachycardia 10/11/2020  . Night sweats 10/11/2020  . DM2 (diabetes mellitus, type 2) ([place]) 10/11/2020  . Lymphoma ([place]) 10/11/2020  . Hypertension 09/25/2020  . BPH (benign prostatic hyperplasia) 09/25/2020  . Insomnia 09/25/2020    Past Surgical History:  Procedure Laterality Date  . RIGHT/LEFT HEART CATH AND CORONARY ANGIOGRAPHY N/A 10/14/2020   Procedure: RIGHT/LEFT HEART CATH AND CORONARY ANGIOGRAPHY;  Surgeon: Jettie Booze, MD;  Location: [facility];  Service: Cardiovascular;  Laterality: N/A;  . SPINAL FIXATION SURGERY  02/26/2020       Family History  Problem Relation Age of Onset  . Hypertension Mother   . Hyperlipidemia Mother   . Stroke Mother   . Cancer Mother   . Hypertension Father   .  Hyperlipidemia Father   . Healthy Sister     Social History   Tobacco Use  . Smoking status: Current Every Day Smoker    Packs/day: 0.25    Types: Cigarettes  . Smokeless tobacco: Never Used  Vaping Use  . Vaping Use: Never used  Substance Use Topics  . Alcohol use: Never  . Drug use: Not Currently    Types: Cocaine    Home Medications Prior to Admission medications   Medication Sig Start Date End Date Taking? Authorizing Provider  acetaminophen (TYLENOL) 325 MG tablet Take 650 mg by mouth every 6 (six) hours as needed.    [provider]  atorvastatin (LIPITOR) 10 MG tablet Take 10 mg by mouth daily. 09/30/20   [provider]  blood glucose meter kit and supplies KIT Dispense based on patient and insurance preference. Use up to four times daily as directed. (FOR ICD-9 250.00, 250.01). 10/23/20   Barb Merino, MD  brimonidine (ALPHAGAN) 0.2 % ophthalmic solution Place 1 drop into both eyes 2 (two) times daily.     [provider]  cyclobenzaprine (FLEXERIL) 10 MG tablet Take 10 mg by mouth 3 (three) times daily.    [provider]  dapagliflozin propanediol (FARXIGA) 10 MG TABS tablet Take 1 tablet (10 mg total) by mouth daily. 10/23/20   Barb Merino, MD  digoxin (LANOXIN) 0.125 MG tablet Take 1 tablet (0.125 mg total) by mouth daily.  10/23/20 11/22/20  Barb Merino, MD  furosemide (LASIX) 40 MG tablet Take 1 tablet (40 mg total) by mouth daily. 10/23/20   Barb Merino, MD  insulin detemir (LEVEMIR) 100 UNIT/ML FlexPen Inject 25 Units into the skin daily. 10/23/20   Barb Merino, MD  Insulin Pen Needle (PEN NEEDLES 29GX1/2") 29G X 12MM MISC 1 each by Does not apply route daily. 10/23/20   Barb Merino, MD  latanoprost (XALATAN) 0.005 % ophthalmic solution Place 1 drop into both eyes at bedtime.     [provider]  levothyroxine (SYNTHROID) 100 MCG tablet Take 100 mcg by mouth daily before breakfast.    [provider]  melatonin 3 MG TABS tablet Take 1 tablet by mouth at bedtime. 09/08/20   [provider]  metoprolol succinate (TOPROL-XL) 50 MG 24 hr tablet Take 1 tablet (50 mg total) by mouth at bedtime. Take with or immediately following a meal. 10/23/20 11/22/20  Barb Merino, MD  Paliperidone Palmitate (INVEGA SUSTENNA IM) Inject 1 pen into the muscle every 30 (thirty) days.      [provider]  polyethylene glycol (MIRALAX / GLYCOLAX) 17 g packet Take 17 g by mouth 2 (two) times daily. 10/23/20   Barb Merino, MD  risperiDONE (RISPERDAL) 0.5 MG tablet Take 0.5 mg by mouth at bedtime.    [provider]  sacubitril-valsartan (ENTRESTO) 24-26 MG Take 1 tablet by mouth 2 (two) times daily. 10/23/20 11/22/20  Barb Merino, MD  senna (SENOKOT) 8.6 MG TABS tablet Take 2 tablets (17.2 mg total) by mouth 2 (two) times daily. 10/23/20   Barb Merino, MD  spironolactone (ALDACTONE) 25 MG tablet Take 1 tablet (25 mg total) by mouth daily. 10/23/20 11/22/20  Barb Merino, MD  tamsulosin (FLOMAX) 0.4 MG CAPS capsule Take 0.4 mg by mouth daily. 09/08/20   [provider]    Allergies    Ibuprofen and Penicillins  Review of Systems   Review of Systems  Psychiatric/Behavioral: Positive for hallucinations and suicidal ideas.  All other systems reviewed and are negative.   Physical Exam Updated Vital Signs BP 137/84   Pulse (!) 59   Temp 98.6 F (37 C) (Oral)   Resp (!) 27   Ht 5' 9"  (1.753 m)   Wt 123.8 kg   SpO2 95%   BMI 40.32 kg/m   Physical Exam Vitals and nursing note reviewed.  Constitutional:      General: He is not in acute distress.    Appearance: He is well-developed and well-nourished. He is obese.     Comments: Appears nontoxic  HENT:     Head: Normocephalic and atraumatic.  Eyes:     Extraocular Movements: Extraocular movements intact and EOM normal.     Conjunctiva/sclera: Conjunctivae normal.     Pupils: Pupils are equal, round, and  reactive to light.  Cardiovascular:     Rate and Rhythm: Normal rate and regular rhythm.     Pulses: Normal pulses and intact distal pulses.  Pulmonary:     Effort: Pulmonary effort is normal. No respiratory distress.     Breath sounds: Normal breath sounds. No wheezing.     Comments: Clear lung sounds Abdominal:     General: There is no distension.     Palpations: Abdomen is soft. There is no mass.     Tenderness: There is no abdominal tenderness. There is no guarding or rebound.  Musculoskeletal:        General: Normal range of motion.  Cervical back: Normal range of motion and neck supple.  Skin:    General: Skin is warm and dry.     Capillary Refill: Capillary refill takes less than 2 seconds.  Neurological:     Mental Status: He is alert and oriented to person, place, and time.  Psychiatric:        Attention and Perception: He perceives auditory hallucinations.        Mood and Affect: Mood and affect normal.        Behavior: Behavior is withdrawn.        Thought Content: Thought content includes suicidal ideation. Thought content does not include homicidal ideation. Thought content includes suicidal plan. Thought content does not include homicidal plan.     Comments: Poor eye contact.  Reports SI and hallucinations.     ED Results / Procedures / Treatments   Labs (all labs ordered are listed, but only abnormal results are displayed) Labs Reviewed  COMPREHENSIVE METABOLIC PANEL - Abnormal; Notable for the following components:      Result Value   CO2 20 (*)    Glucose, Bld 137 (*)    Creatinine, Ser 1.28 (*)    Total Protein 8.2 (*)    ALT 62 (*)    All other components within normal limits  SALICYLATE LEVEL - Abnormal; Notable for the following components:   Salicylate Lvl <5.0 (*)    All other components within normal limits  ACETAMINOPHEN LEVEL - Abnormal; Notable for the following components:   Acetaminophen (Tylenol), Serum <10 (*)    All other components  within normal limits  CBC - Abnormal; Notable for the following components:   WBC 12.5 (*)    RBC 6.24 (*)    MCV 78.4 (*)    MCH 25.8 (*)    RDW 18.6 (*)    All other components within normal limits  RESP PANEL BY RT-PCR (FLU A&B, COVID) ARPGX2  ETHANOL  RAPID URINE DRUG SCREEN, HOSP PERFORMED  URINALYSIS, ROUTINE W REFLEX MICROSCOPIC    EKG None  Radiology DG Chest 2 View  Result Date: 10/28/2020 CLINICAL DATA:  History of congestive heart failure.  Weakness. EXAM: CHEST - 2 VIEW COMPARISON:  10/10/2020 FINDINGS: Mild cardiomegaly. Tortuous aorta. Pulmonary venous hypertension without frank edema. No effusion. No infiltrate or collapse. IMPRESSION: Cardiomegaly and pulmonary venous hypertension. No frank edema. Electronically Signed   By: Nelson Chimes M.D.   On: 10/28/2020 15:15    Procedures Procedures (including critical care time)  Medications Ordered in ED Medications - No data to display  ED Course  I have reviewed the triage vital signs and the nursing notes.  Pertinent labs & imaging results that were available during my care of the patient were reviewed by me and considered in my medical decision making (see chart for details).    MDM Rules/Calculators/A&P                          Patient presenting for suicidal thoughts. On exam, patient appears nontoxic. He does have medical history including CHF, diabetes, hypertension. As such, will obtain labs and chest x-ray prior to medical clearance.  Labs interpreted by me, overall reassuring. No sign of concerning DKA. Chest x-ray viewed interpreted by me, no acute effusion. Pulmonary exam is overall reassuring, I do not believe he needs admission for underlying metabolic or medical problem. Will consult with TTS.  The patient has been placed in psychiatric observation due to the  need to provide a safe environment for the patient while obtaining psychiatric consultation and evaluation, as well as ongoing medical and  medication management to treat the patient's condition.  The patient has not been placed under full IVC at this time.  Final Clinical Impression(s) / ED Diagnoses Final diagnoses:  Suicidal ideation    Rx / DC Orders ED Discharge Orders    None       Franchot Heidelberg, PA-C 10/28/20 2140    Tegeler, Gwenyth Allegra, MD 10/29/20 [phone]

## 2020-10-29 LAB — CBG MONITORING, ED
Glucose-Capillary: 163 mg/dL — ABNORMAL HIGH (ref 70–99)
Glucose-Capillary: 186 mg/dL — ABNORMAL HIGH (ref 70–99)

## 2020-10-29 NOTE — ED Notes (Signed)
[name] currently eating

## 2020-10-29 NOTE — ED Provider Notes (Signed)
Emergency Medicine Observation Re-evaluation Note  Charles [name] is a 54 y.o. male, seen on rounds today.  Pt initially presented to the ED for complaints of Suicidal Currently, the patient is waiting in the ED for an inpatient psychiatric bed.  Physical Exam  BP 124/73   Pulse (!) 109   Temp 98.3 F (36.8 C) (Oral)   Resp 17   Ht 1.753 m (5\' 9" )   Wt 123.8 kg   SpO2 99%   BMI 40.32 kg/m  Physical Exam General: No acute distress Cardiac: Normal rate Lungs: Breathing easily Psych: Depressed mood  ED Course / MDM  EKG:    I have reviewed the labs performed to date as well as medications administered while in observation.  Recent changes in the last 24 hours include patient was assessed by psychiatry. .  Plan  Current plan is for inpatient treatment once a bed is available    Dorie Rank, MD 10/29/20 [phone]

## 2020-10-29 NOTE — Progress Notes (Signed)
Resources provided.  Pt was appreciative and thanked the CSW.  Please reconsult if future social work needs arise.  CSW signing off, as social work intervention is no longer needed.  Alphonse Guild. Raelyn Racette  MSW, LCSW, LCAS, CCS Transitions of Care Clinical Social Worker Care Coordination Department Ph: [phone]

## 2020-10-29 NOTE — ED Notes (Signed)
Belongings placed in cabinet 5-8 at nurses station

## 2020-10-29 NOTE — Progress Notes (Signed)
Pt cleared by psychiatry who is in the ED this evening and who has seen the pt at bedside, per the ED Secretary Lauren.  CSW to provide pt ALF resources/resource list at the request of psychiatry prior to D/C.   CSW gathering resources now and will provide to the pt at bedside to explain options available to the pt.  CSW will continue to follow for D/C needs.  Alphonse Guild. Marcellino Fidalgo  MSW, LCSW, LCAS, CCS Transitions of Care Clinical Social Worker Care Coordination Department Ph: [phone]

## 2020-10-29 NOTE — ED Notes (Signed)
miralax charted as given, but patient did not take medicine. Unable to change in [facility]

## 2020-10-30 DIAGNOSIS — F4323 Adjustment disorder with mixed anxiety and depressed mood: Secondary | ICD-10-CM | POA: Diagnosis present

## 2020-10-30 LAB — URINALYSIS, ROUTINE W REFLEX MICROSCOPIC
Bacteria, UA: NONE SEEN
Bilirubin Urine: NEGATIVE
Glucose, UA: >=500 mg/dL — AB
Hgb urine dipstick: NEGATIVE
Ketones, ur: NEGATIVE mg/dL
Leukocytes,Ua: NEGATIVE
Nitrite: NEGATIVE
Protein, ur: NEGATIVE mg/dL
Specific Gravity, Urine: 1.027 (ref 1.005–1.030)
pH: 5 (ref 5.0–8.0)

## 2020-10-30 LAB — RAPID URINE DRUG SCREEN, HOSP PERFORMED
Amphetamines: NOT DETECTED
Barbiturates: NOT DETECTED
Benzodiazepines: NOT DETECTED
Cocaine: NOT DETECTED
Opiates: NOT DETECTED
Tetrahydrocannabinol: NOT DETECTED

## 2020-10-30 LAB — CBG MONITORING, ED
Glucose-Capillary: 148 mg/dL — ABNORMAL HIGH (ref 70–99)
Glucose-Capillary: 156 mg/dL — ABNORMAL HIGH (ref 70–99)
Glucose-Capillary: 151 mg/dL — ABNORMAL HIGH (ref 70–99)

## 2020-10-30 NOTE — Progress Notes (Signed)
Patient ID: Charles [name], male   DOB: 1966-04-20, 54 y.o.   MRN: [id number] Per emergency department staff patient endorses suicidal ideation with a plan if discharged to current facility. Patient reviewed with Dr. Serafina Mitchell. Patient recommendation updated to inpatient geriatric treatment.

## 2020-10-30 NOTE — ED Notes (Signed)
Went to help pt change into his cloths only to find him with a pen that he got from his walker trying to hurt himself. Pt was trying to cut his wrist with the pen, I took it from and let the nurse know what was going on. We notice that he had scratched and  remove some of his skin on his wrist but not any deep cut or any cut at all. We move pt to a hall bed so we can all keep an eye on him.

## 2020-10-30 NOTE — ED Notes (Signed)
[name] resting comfortably. 

## 2020-10-30 NOTE — ED Provider Notes (Addendum)
Patient has been in the ER for evaluation for suicidal thoughts.  He was seen by the behavioral health team and cleared for outpatient treatment and management.  [name] stable for discharge to his assisted living facility.  Addendum: Patient attempted to be discharged, however he was found by nursing staff trying to leave prior to his discharge being formalized.  He states that if there disconnected me back to his living facility he is given a go sit outside in traffic.  Patient brought back to the ER, I have reconsulted the psychiatric team for repeat evaluation due to persistent threats of suicide with an active plan.   Luna Fuse, MD 10/30/20 1010    Luna Fuse, MD 10/30/20 1149

## 2020-10-30 NOTE — BH Assessment (Addendum)
[facility] Assessment Progress Note  Per Hampton Abbot, MD, this voluntary pt does not require psychiatric hospitalization at this time.  Pt is psychiatrically cleared.  Marylou Flesher, CSW provided pt with outpatient referrals yesterday.  Per Dr Dwyane Dee, pt's behavioral health problems stem from dissatisfaction with the residential facility in which he currently lives.  A new TOC consult will be ordered to offer alternatives.  At 09:30 this Probation officer spoke to [employer], [place], informing her of of.  EDP Thamas Jaegers, MD and pt's nurse, [place], have been notified.  Jalene Mullet, MA Triage Specialist [phone]   Addendum:  After further consideration in consultation with Dr Dwyane Dee, it has been determined that this pt requires psychiatric hospitalization.  The following facilities have been contacted to seek placement for this pt, with results as noted:  Beds available, information sent, decision pending: [facility]  Unable to reach: [facility]  At capacity: [facility]  If this voluntary pt is accepted to a facility, please discuss disposition with pt to be sure that he agree to the plan.  If a facility agrees to accept pt and the plan changes in any way please call the facility to inform them of the change.  Final disposition is pending as of this writing.  Jalene Mullet, [place] Coordinator [phone]

## 2020-10-30 NOTE — Consult Note (Signed)
[facility] Psych ED Discharge  10/30/2020 11:15 AM Charles [name]  MRN:  [id number] Principal Problem: Adjustment disorder with mixed anxiety and depressed mood Discharge Diagnoses: Principal Problem:   Adjustment disorder with mixed anxiety and depressed mood   Subjective: Patient states "I do not like my new assisted living facility, I feel like I am not getting the medicine the way that I should be and I do not like the way that I am treated." Patient reports approximately 1 month ago he was relocated from his assisted living facility as this facility required renovation.  Patient reports he has been in an alternate assisted living facility x1 month and he is not satisfied with his treatment at this facility.  Patient currently resides in [place] in a assisted living facility.  Patient denies access to weapons.  [name] retired.  Patient denies alcohol and substance use.  Patient endorses average sleep and appetite.  Patient assessed by nurse practitioner.  Patient alert and oriented, answers appropriately.  Patient pleasant cooperative during assessment.  Patient endorses "vague" suicidal ideation x1 month.  Patient denies any plan or intent to harm self today.  Patient contracts verbally for safety with this Clinical research associate. Patient denies homicidal ideations.  Patient denies both auditory and visual hallucinations.  There is no evidence of delusional thought content and no indication that patient is responding to internal stimuli.  Patient denies symptoms of paranoia.  Patient offered support and encouragement.  Patient reports he is looking forward to returning to his assisted living facility once renovation is complete.  Total Time spent with patient: 30 minutes  Past Psychiatric History: Bipolar 1 disorder  Past Medical History:  Past Medical History:  Diagnosis Date  . Bipolar 1 disorder (HCC)   . DM2 (diabetes mellitus, type 2) (HCC)   . HTN (hypertension)     Past Surgical History:   Procedure Laterality Date  . RIGHT/LEFT HEART CATH AND CORONARY ANGIOGRAPHY N/A 10/14/2020   Procedure: RIGHT/LEFT HEART CATH AND CORONARY ANGIOGRAPHY;  Surgeon: Corky Crafts, MD;  Location: [facility] INVASIVE CV LAB;  Service: Cardiovascular;  Laterality: N/A;  . SPINAL FIXATION SURGERY  02/26/2020   Family History:  Family History  Problem Relation Age of Onset  . Hypertension Mother   . Hyperlipidemia Mother   . Stroke Mother   . Cancer Mother   . Hypertension Father   . Hyperlipidemia Father   . Healthy Sister    Family Psychiatric  History: None reported Social History:  Social History   Substance and Sexual Activity  Alcohol Use Never     Social History   Substance and Sexual Activity  Drug Use Not Currently  . Types: Cocaine    Social History   Socioeconomic History  . Marital status: Divorced    Spouse name: Not on file  . Number of children: 1  . Years of education: Not on file  . Highest education level: Not on file  Occupational History  . Not on file  Tobacco Use  . Smoking status: Current Every Day Smoker    Packs/day: 0.25    Types: Cigarettes  . Smokeless tobacco: Never Used  Vaping Use  . Vaping Use: Never used  Substance and Sexual Activity  . Alcohol use: Never  . Drug use: Not Currently    Types: Cocaine  . Sexual activity: Not on file  Other Topics Concern  . Not on file  Social History Narrative  . Not on file   Social Determinants of Health  Financial Resource Strain: Not on file  Food Insecurity: Not on file  Transportation Needs: Not on file  Physical Activity: Not on file  Stress: Not on file  Social Connections: Not on file    Has this patient used any form of tobacco in the last 30 days? (Cigarettes, Smokeless Tobacco, Cigars, and/or Pipes) A prescription for an FDA-approved tobacco cessation medication was offered at discharge and the patient refused  Current Medications: Current Facility-Administered Medications   Medication Dose Route Frequency Provider Last Rate Last Admin  . atorvastatin (LIPITOR) tablet 10 mg  10 mg Oral Daily Caccavale, Sophia, PA-C   10 mg at 10/30/20 0909  . cyclobenzaprine (FLEXERIL) tablet 10 mg  10 mg Oral TID Caccavale, Sophia, PA-C   10 mg at 10/30/20 0909  . dapagliflozin propanediol (FARXIGA) tablet 10 mg  10 mg Oral Daily Caccavale, Sophia, PA-C   10 mg at 10/30/20 0911  . digoxin (LANOXIN) tablet 0.125 mg  0.125 mg Oral Daily Caccavale, Sophia, PA-C   0.125 mg at 10/30/20 0910  . furosemide (LASIX) tablet 40 mg  40 mg Oral Daily Caccavale, Sophia, PA-C   40 mg at 10/30/20 0910  . insulin detemir (LEVEMIR) injection 25 Units  25 Units Subcutaneous Daily Caccavale, Sophia, PA-C   25 Units at 10/30/20 1101  . latanoprost (XALATAN) 0.005 % ophthalmic solution 1 drop  1 drop Both Eyes QHS Caccavale, Sophia, PA-C   1 drop at 10/29/20 2154  . levothyroxine (SYNTHROID) tablet 100 mcg  100 mcg Oral QAC breakfast Caccavale, Sophia, PA-C   100 mcg at 10/30/20 0654  . melatonin tablet 3 mg  3 mg Oral QHS Caccavale, Sophia, PA-C   3 mg at 10/29/20 2153  . metoprolol succinate (TOPROL-XL) 24 hr tablet 50 mg  50 mg Oral QHS Caccavale, Sophia, PA-C   50 mg at 10/29/20 2153  . polyethylene glycol (MIRALAX / GLYCOLAX) packet 17 g  17 g Oral BID Caccavale, Sophia, PA-C   17 g at 10/29/20 2154  . risperiDONE (RISPERDAL) tablet 0.5 mg  0.5 mg Oral QHS Caccavale, Sophia, PA-C   0.5 mg at 10/29/20 2153  . sacubitril-valsartan (ENTRESTO) 24-26 mg per tablet  1 tablet Oral BID Caccavale, Sophia, PA-C   1 tablet at 10/30/20 0910  . senna (SENOKOT) tablet 17.2 mg  2 tablet Oral BID Caccavale, Sophia, PA-C   17.2 mg at 10/30/20 0909  . spironolactone (ALDACTONE) tablet 25 mg  25 mg Oral Daily Caccavale, Sophia, PA-C   25 mg at 10/30/20 0910  . tamsulosin (FLOMAX) capsule 0.4 mg  0.4 mg Oral Daily Caccavale, Sophia, PA-C   0.4 mg at 10/30/20 3662   Current Outpatient Medications  Medication Sig  Dispense Refill  . atorvastatin (LIPITOR) 10 MG tablet Take 10 mg by mouth daily.    . brimonidine (ALPHAGAN) 0.2 % ophthalmic solution Place 1 drop into both eyes 2 (two) times daily.     . cyclobenzaprine (FLEXERIL) 10 MG tablet Take 10 mg by mouth 3 (three) times daily.    . dapagliflozin propanediol (FARXIGA) 10 MG TABS tablet Take 1 tablet (10 mg total) by mouth daily. 30 tablet 3  . digoxin (LANOXIN) 0.125 MG tablet Take 1 tablet (0.125 mg total) by mouth daily. 30 tablet 0  . furosemide (LASIX) 40 MG tablet Take 1 tablet (40 mg total) by mouth daily. 30 tablet 3  . insulin detemir (LEVEMIR) 100 UNIT/ML FlexPen Inject 25 Units into the skin daily. 15 mL 11  . latanoprost (  XALATAN) 0.005 % ophthalmic solution Place 1 drop into both eyes at bedtime.     Marland Kitchen levothyroxine (SYNTHROID) 100 MCG tablet Take 100 mcg by mouth daily before breakfast.    . melatonin 3 MG TABS tablet Take 3 mg by mouth at bedtime.    . metoprolol succinate (TOPROL-XL) 50 MG 24 hr tablet Take 1 tablet (50 mg total) by mouth at bedtime. Take with or immediately following a meal. 30 tablet 0  . Paliperidone Palmitate (INVEGA SUSTENNA IM) Inject 1 pen into the muscle every 30 (thirty) days.    . polyethylene glycol (MIRALAX / GLYCOLAX) 17 g packet Take 17 g by mouth 2 (two) times daily. 14 each 0  . risperiDONE (RISPERDAL) 0.5 MG tablet Take 0.5 mg by mouth at bedtime.    . sacubitril-valsartan (ENTRESTO) 24-26 MG Take 1 tablet by mouth 2 (two) times daily. 60 tablet 0  . senna (SENOKOT) 8.6 MG TABS tablet Take 2 tablets (17.2 mg total) by mouth 2 (two) times daily. 120 tablet 0  . spironolactone (ALDACTONE) 25 MG tablet Take 1 tablet (25 mg total) by mouth daily. 30 tablet 0  . tamsulosin (FLOMAX) 0.4 MG CAPS capsule Take 0.4 mg by mouth daily.    . blood glucose meter kit and supplies KIT Dispense based on patient and insurance preference. Use up to four times daily as directed. (FOR ICD-9 250.00, 250.01). 1 each 0  .  Insulin Pen Needle (PEN NEEDLES 29GX1/2") 29G X MISC 1 each by Does not apply route daily. 100 each 0   PTA Medications: (Not in a hospital admission)   Musculoskeletal: Strength & Muscle Tone: decreased Gait & Station: unable to assess Patient leans: N/A  Psychiatric Specialty Exam: Physical Exam Vitals and nursing note reviewed.  Constitutional:      Appearance: He is well-developed.  HENT:     Head: Normocephalic.  Cardiovascular:     Rate and Rhythm: Normal rate.  Pulmonary:     Effort: Pulmonary effort is normal.  Neurological:     Mental Status: He is alert and oriented to person, place, and time.  Psychiatric:        Attention and Perception: Attention and perception normal.        Mood and Affect: Mood and affect normal.        Speech: Speech normal.        Behavior: Behavior normal. Behavior is cooperative.        Thought Content: Thought content includes suicidal ideation.        Cognition and Memory: Cognition and memory normal.        Judgment: Judgment normal.     Review of Systems  Constitutional: Negative.   HENT: Negative.   Eyes: Negative.   Respiratory: Negative.   Cardiovascular: Negative.   Gastrointestinal: Negative.   Genitourinary: Negative.   Musculoskeletal: Negative.   Skin: Negative.   Neurological: Negative.   Psychiatric/Behavioral: Positive for suicidal ideas.    Blood pressure 129/89, pulse 98, temperature 97.6 F (36.4 C), temperature source Oral, resp. rate 18, height 5\' 9"  (1.753 m), weight 123.8 kg, SpO2 96 %.Body mass index is 40.32 kg/m.  General Appearance: Casual and Fairly Groomed  Eye Contact:  Good  Speech:  Clear and Coherent and Normal Rate  Volume:  Normal  Mood:  Euthymic  Affect:  Appropriate and Congruent  Thought Process:  Coherent, Goal Directed and Descriptions of Associations: Intact  Orientation:  Full (Time, Place, and Person)  Thought Content:  WDL and Logical  Suicidal Thoughts:  Yes.  without  intent/plan  Homicidal Thoughts:  No  Memory:  Immediate;   Good Recent;   Good Remote;   Good  Judgement:  Fair  Insight:  Fair  Psychomotor Activity:  Normal  Concentration:  Concentration: Good and Attention Span: Good  Recall:  Good  Fund of Knowledge:  Good  Language:  Good  Akathisia:  No  Handed:  Right  AIMS (if indicated):     Assets:  Communication Skills Desire for Improvement Financial Resources/Insurance Housing Intimacy Leisure Time Resilience Social Support  ADL's:    Cognition:  WNL  Sleep:        Demographic Factors:  Male  Loss Factors: NA  Historical Factors: NA  Risk Reduction Factors:   Living with another person, especially a relative, Positive social support, Positive therapeutic relationship and Positive coping skills or problem solving skills  Continued Clinical Symptoms:  Previous Psychiatric Diagnoses and Treatments  Cognitive Features That Contribute To Risk:  None    Suicide Risk:  Minimal: No identifiable suicidal ideation.  Patients presenting with no risk factors but with morbid ruminations; may be classified as minimal risk based on the severity of the depressive symptoms   Follow-up [facility].   Specialty: Emergency Medicine Why: As needed, If symptoms worsen Contact information: [place] [id number] [place] [phone]       Your doctor In 2 days.               Plan Of Care/Follow-up recommendations:  Other:  Patient reviewed with Dr Serafina Mitchell.  Follow-up with established outpatient psychiatry or [facility] behavioral health center.  Disposition: Patient cleared by psychiatry. Emmaline Kluver, FNP 10/30/2020, 11:15 AM

## 2020-10-30 NOTE — ED Notes (Signed)
TTS machine at bedside. 

## 2020-10-30 NOTE — NC FL2 (Signed)
[place] LEVEL OF CARE SCREENING TOOL     IDENTIFICATION  Patient Name: Charles [name] Birthdate: May 19, 1966 Sex: male Admission Date (Current Location): 10/28/2020  [facility] and [place] Number:      Facility and Address:  [facility],  [place] [address] [place]      Provider Number: [phone] [id number]  Attending Physician Name and Address:  Default, Provider, MD  Relative Name and Phone Number:       Current Level of Care: Hospital Recommended Level of Care: [place] Prior Approval Number:    Date Approved/Denied:   PASRR Number:    Discharge Plan: Other (Comment)    Current Diagnoses: Patient Active Problem List   Diagnosis Date Noted  . Acute systolic heart failure ([place])   . Mediastinal lymphadenopathy 10/11/2020  . Shortness of breath 10/11/2020  . Sinus tachycardia 10/11/2020  . Night sweats 10/11/2020  . DM2 (diabetes mellitus, type 2) ([place]) 10/11/2020  . Lymphoma ([place]) 10/11/2020  . Hypertension 09/25/2020  . BPH (benign prostatic hyperplasia) 09/25/2020  . Insomnia 09/25/2020    Orientation RESPIRATION BLADDER Height & Weight     Self,Time,Place,Situation  Normal Continent Weight: 273 lb (123.8 kg) Height:  _0  (175.3 cm)  BEHAVIORAL SYMPTOMS/MOOD NEUROLOGICAL BOWEL NUTRITION STATUS      Continent Diet  AMBULATORY STATUS COMMUNICATION OF NEEDS Skin   Independent Verbally Normal                       Personal Care Assistance Level of Assistance  Bathing,Feeding,Dressing,Total care   Feeding assistance: Limited assistance Dressing Assistance: Limited assistance Total Care Assistance: Limited assistance   Functional Limitations Info  Sight,Hearing,Speech Sight Info: Adequate Hearing Info: Adequate Speech Info: Adequate    SPECIAL CARE FACTORS FREQUENCY                       Contractures Contractures Info: Not present    Additional Factors Info  Code Status,Allergies Code Status  Info: Full Allergies Info: Ibuprofen, Penicillins Psychotropic Info: Risperdal tablet .61m daily at bedtime Insulin Sliding Scale Info: insulin aspart (novoLOG) injection 0-15 Units 3 times daily with meals,insulin detemir (LEVEMIR) injection 8 Units daily       Current Medications (10/30/2020):  This is the current hospital active medication list Current Facility-Administered Medications  Medication Dose Route Frequency Provider Last Rate Last Admin  . atorvastatin (LIPITOR) tablet 10 mg  10 mg Oral Daily Caccavale, Sophia, PA-C   10 mg at 10/30/20 0909  . cyclobenzaprine (FLEXERIL) tablet 10 mg  10 mg Oral TID Caccavale, Sophia, PA-C   10 mg at 10/30/20 0909  . dapagliflozin propanediol (FARXIGA) tablet 10 mg  10 mg Oral Daily Caccavale, Sophia, PA-C   10 mg at 10/30/20 0911  . digoxin (LANOXIN) tablet 0.125 mg  0.125 mg Oral Daily Caccavale, Sophia, PA-C   0.125 mg at 10/30/20 0910  . furosemide (LASIX) tablet 40 mg  40 mg Oral Daily Caccavale, Sophia, PA-C   40 mg at 10/30/20 0910  . insulin detemir (LEVEMIR) injection 25 Units  25 Units Subcutaneous Daily Caccavale, Sophia, PA-C   25 Units at 10/29/20 0942  . latanoprost (XALATAN) 0.005 % ophthalmic solution 1 drop  1 drop Both Eyes QHS Caccavale, Sophia, PA-C   1 drop at 10/29/20 2154  . levothyroxine (SYNTHROID) tablet 100 mcg  100 mcg Oral QAC breakfast Caccavale, Sophia, PA-C   100 mcg at 10/30/20 0654  . melatonin  tablet 3 mg  3 mg Oral QHS Caccavale, Sophia, PA-C   3 mg at 10/29/20 2153  . metoprolol succinate (TOPROL-XL) 24 hr tablet 50 mg  50 mg Oral QHS Caccavale, Sophia, PA-C   50 mg at 10/29/20 2153  . polyethylene glycol (MIRALAX / GLYCOLAX) packet 17 g  17 g Oral BID Caccavale, Sophia, PA-C   17 g at 10/29/20 2154  . risperiDONE (RISPERDAL) tablet 0.5 mg  0.5 mg Oral QHS Caccavale, Sophia, PA-C   0.5 mg at 10/29/20 2153  . sacubitril-valsartan (ENTRESTO) 24-26 mg per tablet  1 tablet Oral BID Caccavale, Sophia, PA-C   1 tablet  at 10/30/20 0910  . senna (SENOKOT) tablet 17.2 mg  2 tablet Oral BID Caccavale, Sophia, PA-C   17.2 mg at 10/30/20 0909  . spironolactone (ALDACTONE) tablet 25 mg  25 mg Oral Daily Caccavale, Sophia, PA-C   25 mg at 10/30/20 0910  . tamsulosin (FLOMAX) capsule 0.4 mg  0.4 mg Oral Daily Caccavale, Sophia, PA-C   0.4 mg at 10/30/20 9509   Current Outpatient Medications  Medication Sig Dispense Refill  . atorvastatin (LIPITOR) 10 MG tablet Take 10 mg by mouth daily.    . brimonidine (ALPHAGAN) 0.2 % ophthalmic solution Place 1 drop into both eyes 2 (two) times daily.     . cyclobenzaprine (FLEXERIL) 10 MG tablet Take 10 mg by mouth 3 (three) times daily.    . dapagliflozin propanediol (FARXIGA) 10 MG TABS tablet Take 1 tablet (10 mg total) by mouth daily. 30 tablet 3  . digoxin (LANOXIN) 0.125 MG tablet Take 1 tablet (0.125 mg total) by mouth daily. 30 tablet 0  . furosemide (LASIX) 40 MG tablet Take 1 tablet (40 mg total) by mouth daily. 30 tablet 3  . insulin detemir (LEVEMIR) 100 UNIT/ML FlexPen Inject 25 Units into the skin daily. 15 mL 11  . latanoprost (XALATAN) 0.005 % ophthalmic solution Place 1 drop into both eyes at bedtime.     Marland Kitchen levothyroxine (SYNTHROID) 100 MCG tablet Take 100 mcg by mouth daily before breakfast.    . melatonin 3 MG TABS tablet Take 3 mg by mouth at bedtime.    . metoprolol succinate (TOPROL-XL) 50 MG 24 hr tablet Take 1 tablet (50 mg total) by mouth at bedtime. Take with or immediately following a meal. 30 tablet 0  . Paliperidone Palmitate (INVEGA SUSTENNA IM) Inject 1 pen into the muscle every 30 (thirty) days.    . polyethylene glycol (MIRALAX / GLYCOLAX) 17 g packet Take 17 g by mouth 2 (two) times daily. 14 each 0  . risperiDONE (RISPERDAL) 0.5 MG tablet Take 0.5 mg by mouth at bedtime.    . sacubitril-valsartan (ENTRESTO) 24-26 MG Take 1 tablet by mouth 2 (two) times daily. 60 tablet 0  . senna (SENOKOT) 8.6 MG TABS tablet Take 2 tablets (17.2 mg total) by  mouth 2 (two) times daily. 120 tablet 0  . spironolactone (ALDACTONE) 25 MG tablet Take 1 tablet (25 mg total) by mouth daily. 30 tablet 0  . tamsulosin (FLOMAX) 0.4 MG CAPS capsule Take 0.4 mg by mouth daily.    . blood glucose meter kit and supplies KIT Dispense based on patient and insurance preference. Use up to four times daily as directed. (FOR ICD-9 250.00, 250.01). 1 each 0  . Insulin Pen Needle (PEN NEEDLES 29GX1/2") 29G X 12MM MISC 1 each by Does not apply route daily. 100 each 0     Discharge Medications: Please see discharge  summary for a list of discharge medications.  Relevant Imaging Results:  Relevant Lab Results:   Additional Information SSN: [id number]  Archie Endo, LCSW

## 2020-10-30 NOTE — Discharge Planning (Signed)
Licensed Clinical Social Worker is seeking post-discharge placement for this patient at the following level of care: SNF    

## 2020-10-30 NOTE — ED Notes (Signed)
Patient was found by staff in triage attempting to leave, told staff he wanted to sit in the road instead of going back to his facility. When this RN tried to talk to the patient he refused to communicate.

## 2020-10-30 NOTE — Progress Notes (Addendum)
CSW spoke with Development worker, international aid at [facility] who states the patient can return to the facility once a new FL2 is completed. CSW faxed FL2 to [place] at [phone].  The patient can be discharged to [address] [place]. Patient to transport via cab.  [place], RN aware of discharge plan.  Madilyn Fireman, MSW, LCSW-A Transitions of Care  Clinical Social Worker I [facility] Emergency Departments  Medical ICU [phone]

## 2020-10-31 DIAGNOSIS — F4323 Adjustment disorder with mixed anxiety and depressed mood: Secondary | ICD-10-CM

## 2020-10-31 LAB — CBG MONITORING, ED
Glucose-Capillary: 175 mg/dL — ABNORMAL HIGH (ref 70–99)
Glucose-Capillary: 163 mg/dL — ABNORMAL HIGH (ref 70–99)

## 2020-10-31 MED ORDER — BACITRACIN-NEOMYCIN-POLYMYXIN 400-5-5000 EX OINT
TOPICAL_OINTMENT | CUTANEOUS | Status: DC | PRN
  Administered 2020-10-31: 1 via TOPICAL
  Filled 2020-10-31 (×2): qty 1

## 2020-10-31 NOTE — ED Notes (Signed)
Pt asked for an update on the plan of care, nurse explained that he is holding while social work attempts to place him in a new facility. Pt said he understood. Nurse provided pt with a cup of ice as requested.

## 2020-10-31 NOTE — ED Notes (Signed)
Pt not in bed, not found anywhere else in department. Pt found outside, sitting on bench. Brought back to ED bed. Dr Sherry Ruffing at bedside.
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
## 2020-10-31 NOTE — Consult Note (Signed)
[place] Psychiatry Consult    Reason for Consult: Situational depression Referring Physician: EDP Patient Identification: Charles [name] MRN:  [id number] Principal Diagnosis: Adjustment disorder with mixed anxiety and depressed mood Diagnosis:  Principal Problem:   Adjustment disorder with mixed anxiety and depressed mood   Total Time spent with patient: 30 minutes  Subjective:   Charles [name] is a 54 y.o. male patient admitted with dislike of his assisted living facility.  HPI: [name] sitting on his bed in the hallway.  He is upset about there being too many lights and noises in the emergency department.  States that it bothers his glaucoma.  Initially he came to the emergency department for "fluid on his heart and hearing horses."  Then he became suicidal as he does not like where he is 11.  He was living at [facility] and was moved to loss an adult [employer] after [facility] was closed for renovations.  He feels like "they are killing me as they were not feeding me or giving me my medications correctly."  Denies any weight loss.  Only states he is suicidal if he has to return to the facility.  No homicidal ideations, hallucinations, or substance abuse.  Dr. Dwyane Dee has reviewed this client and would like social work to be involved with his living situation as at this time his symptoms are only situational.  Calm and cooperative on assessment and discussed with him that the case would be turned over to social work as it is a placement issue.  Later this upset him and he tried to walk out of the emergency department to stop in traffic as he did not want to return.  Again this is situational and based on his place to live, Dr. Dwyane Dee feels strongly that social work should be involved, consult placed.  Past history of substance use with admissions at [facility].  12/23: Patient states "I do not like my new assisted living facility, I feel like I am not getting the medicine the way  that I should be and I do not like the way that I am treated."  Patient reports approximately 1 month ago he was relocated from his assisted living facility as this facility required renovation.  Patient reports he has been in an alternate assisted living facility x1 month and he is not satisfied with his treatment at this facility.  Patient currently resides in [place] in a assisted living facility.  Patient denies access to weapons.  [name] retired.  Patient denies alcohol and substance use.  Patient endorses average sleep and appetite.  Patient assessed by nurse practitioner.  Patient alert and oriented, answers appropriately.  Patient pleasant cooperative during assessment.  Patient endorses "vague" suicidal ideation x1 month.  Patient denies any plan or intent to harm self today.  Patient contracts verbally for safety with this Probation officer. Patient denies homicidal ideations.  Patient denies both auditory and visual hallucinations.  There is no evidence of delusional thought content and no indication that patient is responding to internal stimuli.  Patient denies symptoms of paranoia.  Patient offered support and encouragement.  Patient reports he is looking forward to returning to his assisted living facility once renovation is complete.   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 Prior Outpatient Therapy:  Yes  not currently  Past Medical History:  Past Medical History:  Diagnosis Date  . Bipolar 1 disorder ([place])   . DM2 (diabetes mellitus, type 2) ([place])   . HTN (hypertension)     Past Surgical History:  Procedure Laterality Date  . RIGHT/LEFT HEART CATH AND CORONARY ANGIOGRAPHY N/A 10/14/2020   Procedure: RIGHT/LEFT HEART CATH AND CORONARY ANGIOGRAPHY;  Surgeon: Jettie Booze, MD;  Location: [place] [facility];  Service: Cardiovascular;  Laterality: N/A;  . SPINAL FIXATION SURGERY  02/26/2020   Family History:  Family History  Problem Relation Age of Onset  . Hypertension Mother   . Hyperlipidemia Mother   . Stroke Mother   . Cancer Mother   . Hypertension Father   . Hyperlipidemia Father   . Healthy Sister    Family Psychiatric  History: None Social History:  Social History   Substance and Sexual Activity  Alcohol Use Never     Social History   Substance and Sexual Activity  Drug Use Not Currently  . Types: Cocaine    Social History   Socioeconomic History  . Marital status: Divorced    Spouse name: Not on file  . Number of children: 1  . Years of education: Not on file  . Highest education level: Not on file  Occupational History  . Not on file  Tobacco Use  . Smoking status: Current Every Day Smoker    Packs/day: 0.25    Types: Cigarettes  . Smokeless tobacco: Never Used  Vaping Use  . Vaping Use: Never used  Substance and Sexual Activity  . Alcohol use: Never  . Drug use: Not Currently    Types: Cocaine  . Sexual activity: Not on file  Other Topics Concern  . Not on file  Social History Narrative  . Not on file   Social Determinants of Health   Financial Resource Strain: Not on file  Food Insecurity: Not on file  Transportation Needs: Not on file  Physical Activity: Not on file  Stress: Not on file  Social Connections: Not on file   Additional Social History:    Allergies:   Allergies  Allergen Reactions  . Ibuprofen   . Penicillins Rash    Labs:  Results for orders placed or performed during the hospital encounter of 10/28/20 (from the past 48 hour(s))  POC CBG, ED     Status: Abnormal   Collection Time: 10/30/20  8:47 AM  Result Value Ref Range   Glucose-Capillary 148 (H) 70 - 99 mg/dL    Comment: Glucose reference range applies only to samples taken after fasting for at least 8 hours.   Comment 1 Notify RN   Rapid urine  drug screen (hospital performed)     Status: None   Collection Time: 10/30/20  9:23 AM  Result Value Ref Range   Opiates NONE DETECTED NONE DETECTED   Cocaine NONE DETECTED NONE DETECTED   Benzodiazepines NONE DETECTED NONE DETECTED   Amphetamines NONE DETECTED NONE DETECTED   Tetrahydrocannabinol NONE DETECTED NONE DETECTED   Barbiturates NONE DETECTED NONE DETECTED    Comment: (NOTE) DRUG SCREEN FOR MEDICAL PURPOSES ONLY.  IF CONFIRMATION IS NEEDED FOR ANY PURPOSE, NOTIFY LAB WITHIN 5 DAYS.  LOWEST DETECTABLE LIMITS FOR URINE DRUG SCREEN Drug Class                     Cutoff (ng/mL) Amphetamine and metabolites    1000 Barbiturate and metabolites    200 Benzodiazepine  678 Tricyclics and metabolites     300 Opiates and metabolites        300 Cocaine and metabolites        300 THC                            50 Performed at [facility], [place] [address] [place], [place] [address]   Urinalysis, Routine w reflex microscopic     Status: Abnormal   Collection Time: 10/30/20  9:23 AM  Result Value Ref Range   Color, Urine YELLOW YELLOW   APPearance CLEAR CLEAR   Specific Gravity, Urine 1.027 1.005 - 1.030   pH 5.0 5.0 - 8.0   Glucose, UA >=500 (A) NEGATIVE mg/dL   Hgb urine dipstick NEGATIVE NEGATIVE   Bilirubin Urine NEGATIVE NEGATIVE   Ketones, ur NEGATIVE NEGATIVE mg/dL   Protein, ur NEGATIVE NEGATIVE mg/dL   Nitrite NEGATIVE NEGATIVE   Leukocytes,Ua NEGATIVE NEGATIVE   WBC, UA 0-5 0 - 5 WBC/hpf   Bacteria, UA NONE SEEN NONE SEEN    Comment: Performed at [facility] [address] [place], [place] [address]  POC CBG, ED     Status: Abnormal   Collection Time: 10/30/20  1:29 PM  Result Value Ref Range   Glucose-Capillary 156 (H) 70 - 99 mg/dL    Comment: Glucose reference range applies only to samples taken after fasting for at least 8 hours.  POC CBG, ED     Status: Abnormal   Collection Time: 10/30/20  6:34 PM   Result Value Ref Range   Glucose-Capillary 151 (H) 70 - 99 mg/dL    Comment: Glucose reference range applies only to samples taken after fasting for at least 8 hours.  POC CBG, ED     Status: Abnormal   Collection Time: 10/31/20  8:06 AM  Result Value Ref Range   Glucose-Capillary 175 (H) 70 - 99 mg/dL    Comment: Glucose reference range applies only to samples taken after fasting for at least 8 hours.    Current Facility-Administered Medications  Medication Dose Route Frequency Provider Last Rate Last Admin  . atorvastatin (LIPITOR) tablet 10 mg  10 mg Oral Daily Caccavale, Sophia, PA-C   10 mg at 10/31/20 0932  . cyclobenzaprine (FLEXERIL) tablet 10 mg  10 mg Oral TID Caccavale, Sophia, PA-C   10 mg at 10/31/20 0933  . dapagliflozin propanediol (FARXIGA) tablet 10 mg  10 mg Oral Daily Caccavale, Sophia, PA-C   10 mg at 10/31/20 0933  . digoxin (LANOXIN) tablet 0.125 mg  0.125 mg Oral Daily Caccavale, Sophia, PA-C   0.125 mg at 10/31/20 2585  . furosemide (LASIX) tablet 40 mg  40 mg Oral Daily Caccavale, Sophia, PA-C   40 mg at 10/31/20 0938  . insulin detemir (LEVEMIR) injection 25 Units  25 Units Subcutaneous Daily Caccavale, Sophia, PA-C   25 Units at 10/31/20 0938  . latanoprost (XALATAN) 0.005 % ophthalmic solution 1 drop  1 drop Both Eyes QHS Caccavale, Sophia, PA-C   1 drop at 10/29/20 2154  . levothyroxine (SYNTHROID) tablet 100 mcg  100 mcg Oral QAC breakfast Caccavale, Sophia, PA-C   100 mcg at 10/31/20 0656  . melatonin tablet 3 mg  3 mg Oral QHS Caccavale, Sophia, PA-C   3 mg at 10/30/20 2155  . metoprolol succinate (TOPROL-XL) 24 hr tablet 50 mg  50 mg Oral QHS Caccavale, Sophia, PA-C   50  mg at 10/30/20 2156  . polyethylene glycol (MIRALAX / GLYCOLAX) packet 17 g  17 g Oral BID Caccavale, Sophia, PA-C   17 g at 10/29/20 2154  . risperiDONE (RISPERDAL) tablet 0.5 mg  0.5 mg Oral QHS Caccavale, Sophia, PA-C   0.5 mg at 10/30/20 2154  . sacubitril-valsartan (ENTRESTO) 24-26 mg  per tablet  1 tablet Oral BID Caccavale, Sophia, PA-C   1 tablet at 10/31/20 0939  . senna (SENOKOT) tablet 17.2 mg  2 tablet Oral BID Caccavale, Sophia, PA-C   17.2 mg at 10/31/20 0939  . spironolactone (ALDACTONE) tablet 25 mg  25 mg Oral Daily Caccavale, Sophia, PA-C   25 mg at 10/31/20 0940  . tamsulosin (FLOMAX) capsule 0.4 mg  0.4 mg Oral Daily Caccavale, Sophia, PA-C   0.4 mg at 10/31/20 0940   Current Outpatient Medications  Medication Sig Dispense Refill  . atorvastatin (LIPITOR) 10 MG tablet Take 10 mg by mouth daily.    . brimonidine (ALPHAGAN) 0.2 % ophthalmic solution Place 1 drop into both eyes 2 (two) times daily.     . cyclobenzaprine (FLEXERIL) 10 MG tablet Take 10 mg by mouth 3 (three) times daily.    . dapagliflozin propanediol (FARXIGA) 10 MG TABS tablet Take 1 tablet (10 mg total) by mouth daily. 30 tablet 3  . digoxin (LANOXIN) 0.125 MG tablet Take 1 tablet (0.125 mg total) by mouth daily. 30 tablet 0  . furosemide (LASIX) 40 MG tablet Take 1 tablet (40 mg total) by mouth daily. 30 tablet 3  . insulin detemir (LEVEMIR) 100 UNIT/ML FlexPen Inject 25 Units into the skin daily. 15 mL 11  . latanoprost (XALATAN) 0.005 % ophthalmic solution Place 1 drop into both eyes at bedtime.     Marland Kitchen levothyroxine (SYNTHROID) 100 MCG tablet Take 100 mcg by mouth daily before breakfast.    . melatonin 3 MG TABS tablet Take 3 mg by mouth at bedtime.    . metoprolol succinate (TOPROL-XL) 50 MG 24 hr tablet Take 1 tablet (50 mg total) by mouth at bedtime. Take with or immediately following a meal. 30 tablet 0  . Paliperidone Palmitate (INVEGA SUSTENNA IM) Inject 1 pen into the muscle every 30 (thirty) days.    . polyethylene glycol (MIRALAX / GLYCOLAX) 17 g packet Take 17 g by mouth 2 (two) times daily. 14 each 0  . risperiDONE (RISPERDAL) 0.5 MG tablet Take 0.5 mg by mouth at bedtime.    . sacubitril-valsartan (ENTRESTO) 24-26 MG Take 1 tablet by mouth 2 (two) times daily. 60 tablet 0  . senna  (SENOKOT) 8.6 MG TABS tablet Take 2 tablets (17.2 mg total) by mouth 2 (two) times daily. 120 tablet 0  . spironolactone (ALDACTONE) 25 MG tablet Take 1 tablet (25 mg total) by mouth daily. 30 tablet 0  . tamsulosin (FLOMAX) 0.4 MG CAPS capsule Take 0.4 mg by mouth daily.    . blood glucose meter kit and supplies KIT Dispense based on patient and insurance preference. Use up to four times daily as directed. (FOR ICD-9 250.00, 250.01). 1 each 0  . Insulin Pen Needle (PEN NEEDLES 29GX1/2") 29G X 12MM MISC 1 each by Does not apply route daily. 100 each 0    Musculoskeletal: Strength & Muscle Tone: within normal limits Gait & Station: normal with a walker  patient leans: N/A  Psychiatric Specialty Exam: Physical Exam Vitals and nursing note reviewed.  Constitutional:      Appearance: Normal appearance.  HENT:  Head: Normocephalic.     Nose: Nose normal.  Pulmonary:     Effort: Pulmonary effort is normal.  Musculoskeletal:        General: Normal range of motion.     Cervical back: Normal range of motion.  Neurological:     General: No focal deficit present.     Mental Status: He is alert and oriented to person, place, and time.  Psychiatric:        Attention and Perception: Attention and perception normal.        Mood and Affect: Mood is depressed.        Speech: Speech normal.        Behavior: Behavior normal.        Thought Content: Thought content normal.        Cognition and Memory: Cognition and memory normal.        Judgment: Judgment is impulsive.     Review of Systems  Psychiatric/Behavioral: Positive for dysphoric mood.  All other systems reviewed and are negative.   Blood pressure 91/63, pulse 92, temperature 98.8 F (37.1 C), temperature source Oral, resp. rate 19, height 5' 9"  (1.753 m), weight 123.8 kg, SpO2 95 %.Body mass index is 40.32 kg/m.  General Appearance: Casual  Eye Contact:  Good  Speech:  Normal Rate  Volume:  Normal  Mood:  Depressed and  Irritable  Affect:  Congruent  Thought Process:  Coherent and Descriptions of Associations: Intact  Orientation:  Full (Time, Place, and Person)  Thought Content:  WDL and Logical  Suicidal Thoughts:  no only if he has to return to his place of living  Homicidal Thoughts:  No  Memory:  Immediate;   Good Recent;   Good Remote;   Good  Judgement:  Fair  Insight:  Fair  Psychomotor Activity:  Normal  Concentration:  Concentration: Good and Attention Span: Good  Recall:  Good  Fund of Knowledge:  Good  Language:  Good  Akathisia:  No  Handed:  Right  AIMS (if indicated):     Assets:  Housing Leisure Time Resilience Social Support  ADL's:  Intact  Cognition:  WNL  Sleep:        Treatment Plan Summary: Bipolar affective disorder, depressed, mild: -Increase Risperdal 0.5 mg at bedtime to twice daily -TOC consult placed as he is situationally suicidal only  Disposition: No evidence of imminent risk to self or others at present.   Patient does not meet criteria for psychiatric inpatient admission.  Waylan Boga, NP 10/31/2020 10:17 AM

## 2020-10-31 NOTE — ED Notes (Signed)
Pt provided with phone in order to have conversation with emergency contact [place]. Pt updated Rhonda on plan of care.

## 2020-10-31 NOTE — ED Notes (Signed)
Patient ambulatory to TCU RM 30, belonging locked in locker, walker with patient.

## 2020-11-01 LAB — CBG MONITORING, ED
Glucose-Capillary: 151 mg/dL — ABNORMAL HIGH (ref 70–99)
Glucose-Capillary: 142 mg/dL — ABNORMAL HIGH (ref 70–99)
Glucose-Capillary: 145 mg/dL — ABNORMAL HIGH (ref 70–99)

## 2020-11-01 NOTE — ED Notes (Signed)
The pt  is alert watching t.v., denies SI or HI. I will continue to monitor.

## 2020-11-01 NOTE — BH Assessment (Signed)
Patient was seen today by Bobby Rumpf NP and this writer to evaluate current mental health status. Patient initially presented to [facility] due to S/I at that time with a plan to "walk out in the road." Patient reports ongoing issues with his current assisted living facility at [facility]. Per notes patient initially presented to the ED for "fluid on his heart" although patient also reported ongoing S/I due to him not desiring to return to his facility due to them "not feeding him or giving him the right medications." Per chart review Reita Cliche DNP assessed patient on 10/31/20 and after staffing with Dwyane Dee MD reported that patient be seen by social work to assist with ongoing needs. Patient as of that note did not meet criteria for a inpatient admission.   Patient reports S/I "only if he has to return to that facility" although patient stated this date after a conversation earlier with his girlfriend, that he promised her that he "would never hurt himself" no matter what happens. Patient also denies any H/I or AVH. Bobby Rumpf NP staffed case with Dwyane Dee MD and they recommended that patient continued to be followed by social work to assist with ongoing placement needs. Patient per providers does not meet   criteria for a inpatient psychiatric admission.

## 2020-11-01 NOTE — ED Provider Notes (Signed)
Emergency Medicine Observation Re-evaluation Note  Charles [name] is a 54 y.o. male, seen on rounds today.  Pt initially presented to the ED for complaints of Suicidal Currently, the patient is SW/CM.  Physical Exam  BP 112/75 (BP Location: Right Arm)   Pulse (!) 102   Temp 98.1 F (36.7 C) (Oral)   Resp 17   Ht 5\' 9"  (1.753 m)   Wt 123.8 kg   SpO2 93%   BMI 40.32 kg/m  Physical Exam General: resting Psych: stable  ED Course / MDM  EKG:    I have reviewed the labs performed to date as well as medications administered while in observation.  Recent changes in the last 24 hours include awaiting CM/SW support  Plan  Current plan is for SW/CM. [name] under full IVC at this time.   Lennice Sites, DO 11/01/20 [phone]

## 2020-11-01 NOTE — ED Notes (Signed)
Pt alert this shift.  Pt cooperative with staff. Medication compliant. Flat. Dull. Pt denied SI at this time.  Pt stated ok with "TV".

## 2020-11-02 LAB — CBG MONITORING, ED
Glucose-Capillary: 145 mg/dL — ABNORMAL HIGH (ref 70–99)
Glucose-Capillary: 155 mg/dL — ABNORMAL HIGH (ref 70–99)

## 2020-11-02 NOTE — TOC Progression Note (Signed)
Transition of Care [facility]) - Progression Note    Patient Details  Name: Charles [name] MRN: [id number] Date of Birth: Apr 07, 1966  Transition of Care [facility]) CM/SW [place], [place] Phone Number: [phone] 11/02/2020, 1:20 PM  Clinical Narrative:     Patient does not meet inpatient psychiatric criteria as per Reita Cliche, NP 12/24 10:17AM note, although attestation from Dr. Dwyane Dee on 12/24 5:07PM, states patient is referred to multiple geri-psych units and patient in showing IVC in chart. Patient information has been sent to [facility] and patient is under review at [facility].  Patient reports S/I if he has to return to current facility [facility], patient does not have a guardian or POA. Placement offers pending.   CSW spoke with Barnet Glasgow at [facility] [phone] who stated the patient arrived to the facility on 10/24/2020 and came to the hospital four days later. CSW updated Barnet Glasgow about patient status and not meeting inpatient psych criteria and asked if the patient could return tot he facility. Barnet Glasgow stated the patient could return to the facility tomorrow Monday 11/03/2020. TOC will continue to follow.       Expected Discharge Plan and Services                                                 Social Determinants of Health (SDOH) Interventions    Readmission Risk Interventions No flowsheet data found.

## 2020-11-03 LAB — CBG MONITORING, ED
Glucose-Capillary: 202 mg/dL — ABNORMAL HIGH (ref 70–99)
Glucose-Capillary: 168 mg/dL — ABNORMAL HIGH (ref 70–99)
Glucose-Capillary: 158 mg/dL — ABNORMAL HIGH (ref 70–99)

## 2020-11-03 NOTE — BH Assessment (Addendum)
[facility] Assessment Progress Note  Over the weekend this pt was psychiatrically cleared.  On 10/31/20 pt was placed back under IVC by EDP Lynden Oxford, MD.  This has been rescinded by Nelly Rout, MD.  Memory Argue, CSW, has been notified.  Doylene Canning, [place] Behavioral Health Coordinator [phone]

## 2020-11-03 NOTE — ED Notes (Signed)
Pt DC off unit to home per provider. Pt alert, calm, cooperative, no s/s of distress. DC information given to and reviewed with pt, pt acknowledged understanding, Belongings given to pt. Pt ambulatory. Pt off unit in w/c, pt escorted off unit by NT. Pt transported by [employer].

## 2020-11-03 NOTE — ED Notes (Signed)
Flat, dull, blunted, cooperative, no s/s of distress. Pt stated, "sad". Pt denied SI.

## 2020-11-03 NOTE — TOC Transition Note (Signed)
Transition of Care [facility]) - CM/SW Discharge Note  Patient Details  Name: Charles [name] MRN: [id number] Date of Birth: 1966-03-19  Transition of Care [facility]) CM/SW Contact:  Ewing Schlein, LCSW Phone Number: 11/03/2020, 1:54 PM   Clinical Narrative: Patient's IVC has been rescinded and is ready to return to his facility, [facility] [place], [place] [address]). FL2 was faxed to [facility]. CSW confirmed with staff it was received. CSW updated RN. RN to set up [employer]. TOC signing off.  Final next level of care: Group Home Barriers to Discharge: ED Barriers Resolved  Patient Goals and CMS Choice Patient states their goals for this hospitalization and ongoing recovery are:: Discharge back to facility CMS Medicare.gov Compare Post Acute Care list provided to:: Patient Choice offered to / list presented to : Patient  Discharge Placement Patient to be transferred to facility by: Safe Control and instrumentation engineer and Services       DME Arranged: N/A DME Agency: NA HH Arranged: NA HH Agency: NA  Readmission Risk Interventions No flowsheet data found.

## 2020-11-03 NOTE — ED Provider Notes (Signed)
Emergency Medicine Observation Re-evaluation Note  Charles [name] is a 55 y.o. male, seen on rounds today.  Pt initially presented to the ED for complaints of Suicidal Currently, the patient is complaining of feeling sad and wanting mental health medications.  Nursing reports no acute issues.  He has not been suicidal in the ED per staff. Pt denies chest pain, sob, abdominal pain.  Reports good appetite.      Physical Exam  BP 112/85 (BP Location: Left Arm)    Pulse 82    Temp 97.8 F (36.6 C) (Oral)    Resp 16    Ht 5\' 9"  (1.753 m)    Wt 123.8 kg    SpO2 91%    BMI 40.32 kg/m  Physical Exam General: NAD Cardiac: RRR Lungs: no respiratory distress Psych: flat  ED Course / MDM  EKG:    I have reviewed the labs performed to date as well as medications administered while in observation.  Recent changes in the last 24 hours include none.  Plan  Current plan is for d/c back to care facility later today.  He will need to keep an appointment with oncology as an outpatient tomorrow.  This will need to be addressed if he cannot be discharged today.  He has been psychiatrically cleared [name] under full IVC at this time.   , MD 11/03/20 1032

## 2020-11-04 ENCOUNTER — Other Ambulatory Visit: Payer: Self-pay

## 2020-11-04 ENCOUNTER — Encounter: Payer: Self-pay | Admitting: Thoracic Surgery (Cardiothoracic Vascular Surgery)

## 2020-11-04 ENCOUNTER — Institutional Professional Consult (permissible substitution) (INDEPENDENT_AMBULATORY_CARE_PROVIDER_SITE_OTHER): Payer: Medicaid Other | Admitting: Thoracic Surgery (Cardiothoracic Vascular Surgery)

## 2020-11-04 VITALS — BP 112/71 | HR 122 | Temp 97.0°F | Resp 20 | Ht 69.0 in | Wt 267.0 lb

## 2020-11-04 DIAGNOSIS — R590 Localized enlarged lymph nodes: Secondary | ICD-10-CM

## 2020-11-04 DIAGNOSIS — J9859 Other diseases of mediastinum, not elsewhere classified: Secondary | ICD-10-CM

## 2020-11-04 DIAGNOSIS — R599 Enlarged lymph nodes, unspecified: Secondary | ICD-10-CM

## 2020-11-04 NOTE — Progress Notes (Signed)
PCP is Patient, No Pcp Per Referring Provider is Orson Slick, MD  Chief Complaint  Patient presents with  . Mediastinal Mass    Initial surgical consult, PET 11/16, CTA 12/3    HPI: Mr. Charles [name] him for consultation regarding mediastinal adenopathy.  Charles [name] is a 54 year old man with a history of acute systolic heart failure currently compensated, bipolar disorder, schizophrenia, tobacco abuse, hypertension, dyslipidemia, obesity, insulin-dependent type 2 diabetes, and benign prostatic hypertrophy.  Back in September 2020 when he had shortness of breath.  He had a PET/CT to rule out pulmonary embolus.  There was no PE, but there was marked AP window adenopathy.  He was referred to Dr. Lorenso Courier.  He evaluated him for both the adenopathy and possible coagulation abnormality with a history of an elevated PTT.  He was admitted to the hospital early December with acute systolic heart failure.  Catheterization showed no CAD.  He did have markedly impaired left-ventricular function with ejection fraction of approximately 20%.  He was managed medically.  He had multiple ED visits in the past 2 weeks due to both medical and mental health issues.  He denies any chest pain, pressure, or tightness.  He denies shortness of breath.  He does complain of anxiety, depression, and nervousness.  He does bruise easily.  He complains of frequent urination since starting diuretics.  His appetite is normal.  He has lost about 16 pounds in the past 2-1/2 months with diuretics.  He smokes about one fourth of a pack of cigarettes daily.  Zubrod Score: At the time of surgery this patient's most appropriate activity status/level should be described as: _0     0    Normal activity, no symptoms _1     1    Restricted in physical strenuous activity but ambulatory, able to do out light work _2     2    Ambulatory and capable of self care, unable to do work activities, up and about >50 % of waking hours                               _3     3    Only limited self care, in bed greater than 50% of waking hours _4     4    Completely disabled, no self care, confined to bed or chair _5     5    Moribund  Past Medical History:  Diagnosis Date  . Bipolar 1 disorder ([place])   . DM2 (diabetes mellitus, type 2) ([place])   . HTN (hypertension)     Past Surgical History:  Procedure Laterality Date  . RIGHT/LEFT HEART CATH AND CORONARY ANGIOGRAPHY N/A 10/14/2020   Procedure: RIGHT/LEFT HEART CATH AND CORONARY ANGIOGRAPHY;  Surgeon: Jettie Booze, MD;  Location: [facility];  Service: Cardiovascular;  Laterality: N/A;  . SPINAL FIXATION SURGERY  02/26/2020    Family History  Problem Relation Age of Onset  . Hypertension Mother   . Hyperlipidemia Mother   . Stroke Mother   . Cancer Mother   . Hypertension Father   . Hyperlipidemia Father   . Healthy Sister     Social History Social History   Tobacco Use  . Smoking status: Current Every Day Smoker    Packs/day: 0.25    Types: Cigarettes  . Smokeless tobacco: Never Used  Vaping Use  . Vaping Use: Never used  Substance Use Topics  . Alcohol use:  Never  . Drug use: Not Currently    Types: Cocaine    Current Outpatient Medications  Medication Sig Dispense Refill  . atorvastatin (LIPITOR) 10 MG tablet Take 10 mg by mouth daily.    . blood glucose meter kit and supplies KIT Dispense based on patient and insurance preference. Use up to four times daily as directed. (FOR ICD-9 250.00, 250.01). 1 each 0  . brimonidine (ALPHAGAN) 0.2 % ophthalmic solution Place 1 drop into both eyes 2 (two) times daily.     . cyclobenzaprine (FLEXERIL) 10 MG tablet Take 10 mg by mouth 3 (three) times daily.    . dapagliflozin propanediol (FARXIGA) 10 MG TABS tablet Take 1 tablet (10 mg total) by mouth daily. 30 tablet 3  . digoxin (LANOXIN) 0.125 MG tablet Take 1 tablet (0.125 mg total) by mouth daily. 30 tablet 0  . furosemide (LASIX) 40 MG tablet Take 1 tablet (40 mg  total) by mouth daily. 30 tablet 3  . insulin detemir (LEVEMIR) 100 UNIT/ML FlexPen Inject 25 Units into the skin daily. 15 mL 11  . Insulin Pen Needle (PEN NEEDLES 29GX1/2") 29G X 12MM MISC 1 each by Does not apply route daily. 100 each 0  . latanoprost (XALATAN) 0.005 % ophthalmic solution Place 1 drop into both eyes at bedtime.     Marland Kitchen levothyroxine (SYNTHROID) 100 MCG tablet Take 100 mcg by mouth daily before breakfast.    . melatonin 3 MG TABS tablet Take 3 mg by mouth at bedtime.    . metoprolol succinate (TOPROL-XL) 50 MG 24 hr tablet Take 1 tablet (50 mg total) by mouth at bedtime. Take with or immediately following a meal. 30 tablet 0  . Paliperidone Palmitate (INVEGA SUSTENNA IM) Inject 1 pen into the muscle every 30 (thirty) days.    . polyethylene glycol (MIRALAX / GLYCOLAX) 17 g packet Take 17 g by mouth 2 (two) times daily. 14 each 0  . risperiDONE (RISPERDAL) 0.5 MG tablet Take 0.5 mg by mouth at bedtime.    . sacubitril-valsartan (ENTRESTO) 24-26 MG Take 1 tablet by mouth 2 (two) times daily. 60 tablet 0  . senna (SENOKOT) 8.6 MG TABS tablet Take 2 tablets (17.2 mg total) by mouth 2 (two) times daily. 120 tablet 0  . spironolactone (ALDACTONE) 25 MG tablet Take 1 tablet (25 mg total) by mouth daily. 30 tablet 0  . tamsulosin (FLOMAX) 0.4 MG CAPS capsule Take 0.4 mg by mouth daily.     No current facility-administered medications for this visit.    Allergies  Allergen Reactions  . Ibuprofen   . Penicillins Rash    Review of Systems  Constitutional: Positive for unexpected weight change. Negative for activity change, appetite change, diaphoresis and fever.  HENT: Negative for trouble swallowing and voice change.   Cardiovascular: Positive for leg swelling (Resolved with diuretics). Negative for chest pain.  Gastrointestinal: Negative for abdominal pain.  Genitourinary: Positive for frequency and urgency.  Musculoskeletal: Positive for arthralgias, back pain and joint  swelling.  Neurological: Negative for seizures and syncope.  Hematological: Positive for adenopathy. Bruises/bleeds easily.  All other systems reviewed and are negative.   BP 112/71 (BP Location: Right Arm, Patient Position: Sitting)   Pulse (!) 122   Temp (!) 97 F (36.1 C)   Resp 20   Ht _0  (1.753 m)   Wt 267 lb (121.1 kg)   SpO2 94% Comment: RA with mask on  BMI 39.43 kg/m  Physical Exam Vitals reviewed.  Constitutional:      General: He is not in acute distress.    Appearance: He is obese.  HENT:     Head: Normocephalic and atraumatic.  Eyes:     General: No scleral icterus.    Extraocular Movements: Extraocular movements intact.  Cardiovascular:     Rate and Rhythm: Regular rhythm. Tachycardia present.     Heart sounds: Normal heart sounds. No murmur heard.   Pulmonary:     Effort: Pulmonary effort is normal. No respiratory distress.     Breath sounds: Normal breath sounds.  Abdominal:     General: There is no distension.     Palpations: Abdomen is soft.  Lymphadenopathy:     Cervical: No cervical adenopathy.  Skin:    General: Skin is warm and dry.  Neurological:     General: No focal deficit present.     Mental Status: He is alert and oriented to person, place, and time.     Cranial Nerves: No cranial nerve deficit.     Motor: No weakness.    Diagnostic Tests: NUCLEAR MEDICINE PET SKULL BASE TO THIGH  TECHNIQUE: 14.1 mCi F-18 FDG was injected intravenously. Full-ring PET imaging was performed from the skull base to thigh after the radiotracer. CT data was obtained and used for attenuation correction and anatomic localization.  Fasting blood glucose: 123 mg/dl  COMPARISON:  CT scans from 07/18/2020  FINDINGS: Mediastinal blood pool activity: SUV max 3.3  Liver activity: SUV max 3.9  NECK: There is misregistration of the PET data and the CT data in the neck region. I have carefully scrutinized the non attenuation corrected data as  well.  Relatively symmetric palatine tonsillar activity and glottic activity noted. This is likely physiologic. No hypermetabolic adenopathy is identified in the neck  Incidental CT findings: none  CHEST: Clustered prevascular lymph nodes are present, the largest 2.4 cm in short axis on image 69 of series 4 (stable from prior CT) with maximum SUV 6.4. Other thoracic lymph nodes are not pathologically enlarged or substantially hypermetabolic.  Incidental CT findings: Mild atherosclerotic calcification of the aortic arch.  ABDOMEN/PELVIS: No splenomegaly or abnormal splenic activity. Physiologic activity in bowel.  Incidental CT findings: Hepatic steatosis. Cholecystectomy. 4 mm nonobstructive left kidney lower pole renal calculus. Postoperative findings along the anterior superior prostate. Prostatomegaly.  SKELETON: No significant abnormal hypermetabolic activity in this region.  Incidental CT findings: Remote posterior decompression in the thoracic spine at the T5 and T6 levels.  IMPRESSION: 1. Prevascular adenopathy with individual nodes measuring up to 2.4 cm in diameter, maximum SUV 6.4 ([place] 4 activity). 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 No adenopathy or suspicious hypermetabolic activity in the neck, abdomen/pelvis, or skeleton. 2. Hepatic steatosis. 3. Nonobstructive left nephrolithiasis. 4. Prostatomegaly. 5.  Aortic Atherosclerosis (ICD10-I70.0).   Electronically Signed   By: Van Clines M.D.   On: 09/23/2020 11:59 CT ANGIOGRAPHY CHEST WITH CONTRAST  TECHNIQUE: Multidetector CT imaging of the chest was performed using the standard protocol during bolus administration of intravenous contrast. Multiplanar CT image reconstructions and MIPs were obtained to evaluate the vascular anatomy.  CONTRAST:  148m OMNIPAQUE IOHEXOL 350 MG/ML SOLN  COMPARISON:  07/18/2020  FINDINGS: Cardiovascular:  Contrast injection is sufficient to demonstrate satisfactory opacification of the pulmonary arteries to the segmental level. There is no pulmonary embolus or evidence of right heart strain. The size of the main pulmonary artery is normal. Moderate cardiomegaly. The course and caliber of the aorta are normal.  There is no atherosclerotic calcification. Opacification decreased due to pulmonary arterial phase contrast bolus timing.  Mediastinum/Nodes:  --again noted is mediastinal adenopathy which has increased in size from prior study. For example the dominant lymph node now measures 2.5 cm (previously measuring approximately 2.1 cm.  -- No hilar lymphadenopathy.  -- No axillary lymphadenopathy.  -- No supraclavicular lymphadenopathy.  -- Normal thyroid gland where visualized.  -  Unremarkable esophagus.  Lungs/Pleura: Airways are patent. No pleural effusion, lobar consolidation, pneumothorax or pulmonary infarction.  Upper Abdomen: Contrast bolus timing is not optimized for evaluation of the abdominal organs. The visualized portions of the organs of the upper abdomen are normal.  Musculoskeletal: No chest wall abnormality. No bony spinal canal stenosis. Stable postsurgical changes are noted of the midthoracic spine.  Review of the MIP images confirms the above findings.  IMPRESSION: 1. No evidence of acute pulmonary embolus. 2. Moderate cardiomegaly. 3. Worsening mediastinal adenopathy as previously evaluated on the patient's PET-CT.   Electronically Signed   By: Constance Holster M.D.   On: 10/10/2020 23:01 I personally reviewed the CT and PET/CT images and concur with the findings noted above  Impression: Charles [name] is a 54 year old man with a history of acute systolic heart failure currently compensated, bipolar disorder, schizophrenia, tobacco abuse, hypertension, dyslipidemia, obesity, insulin-dependent type 2 diabetes, and benign prostatic  hypertrophy.  He does actively smoke currently 4 to 6 cigarettes daily.  He was first found to have AP window adenopathy back in September.  A PET/CT showed the AP window lymph nodes to be markedly hypermetabolic.  There were no other suspicious areas.  Recently he was hospitalized again with heart failure and a repeat CT showed the nodes had increased further in size.  Differential diagnosis includes infectious and inflammatory adenopathy, but the primary consideration is lymphoma.  In any event he needs a biopsy for diagnostic purposes.  This is not an area that is favorable for CT-guided biopsy due to the proximity of the great vessels.  It is not accessible with cervical mediastinoscopy.  Options include an anterior mediastinal anatomy/Chamberlain procedure and a robotic assisted thoracoscopy.  I think in his case a left RATS would be the best option.  I described the proposed surgical procedure to him.  He understands this would be done under general anesthesia.  We would plan to keep him overnight.  He would have a drainage tube in postoperatively.  He understands will be multiple small incisions.  I informed her of the indications, risks, benefits, and alternatives.  He understands the risks include, but not limited to death, MI, DVT, PE, bleeding, possible need for transfusion, infection, air leak, cardiac arrhythmias, as well as possibility of other unforeseeable complications.  He accepts the risk and agrees to proceed  Plan: Robotic left VATS for biopsy of AP window lymph nodes on 11/13/2020  Melrose Nakayama, MD [facility] [phone]

## 2020-11-05 ENCOUNTER — Telehealth: Payer: Self-pay | Admitting: *Deleted

## 2020-11-05 NOTE — Telephone Encounter (Signed)
Attempted to preform prior auth for Mr. Tocci's scheduled surgery on 1/6. Contacted Medicaid number on back of pt's insurance card and was told coverage was inactive at this time. Reached out to Mr. Tippetts on the telephone who did not know anything about his insurance coverage. Spoke with the manager, Devan, at the facility Mr. Whisenant is at who stated he will reach out to Mr. Nay old facility to see if they know anything about an updated Medicaid policy. Will await return phone call.

## 2020-11-06 NOTE — Progress Notes (Signed)
[id number] [place], [place] - [phone] AT [facility] [address] [place] [address] Phone: [phone] Fax: [phone]  [facility] - [place], [place] - [address] [place] [address] Phone: [phone] Fax: [phone]      Your procedure is scheduled on 11/13/2020.  Report to [facility] Main Entrance "A" at 10:20 A.M., and check in at the Admitting office.  Call this number if you have problems the morning of surgery:  [phone]  Call [phone] if you have any questions prior to your surgery date Monday-Friday 8am-4pm    Remember:  Do not eat or drink after midnight the night before your surgery    Take these medicines the morning of surgery with A SIP OF WATER    Atorvastatin (lipitor)   brimonidine (ALPHAGAN)   cyclobenzaprine (FLEXERIL)   digoxin (LANOXIN)   latanoprost (XALATAN)   levothyroxine (SYNTHROID)   metoprolol succinate (TOPROL-XL)   sacubitril-valsartan (ENTRESTO)   tamsulosin (FLOMAX)    WHAT DO I DO ABOUT MY DIABETES MEDICATION?   Marland Kitchen Do not take oral diabetes medicines (pills) the morning of surgery.  . THE DAY BEFORE SURGERY, take 25 units of levemir if taken in the morning. If you take levemir at night, take 12 units of levemir.  Do not take dapagliflozin propanediol (FARXIGA)    . THE MORNING OF SURGERY, take 12 units of levemir.   . The day of surgery, do not take dapagliflozin propanediol (FARXIGA).    HOW TO MANAGE YOUR DIABETES BEFORE AND AFTER SURGERY  Why is it important to control my blood sugar before and after surgery? . Improving blood sugar levels before and after surgery helps healing and can limit problems. . A way of improving blood sugar control is eating a healthy diet by: o  Eating less sugar and carbohydrates o  Increasing activity/exercise o  Talking with your doctor about reaching your blood sugar  goals . High blood sugars (greater than 180 mg/dL) can raise your risk of infections and slow your recovery, so you will need to focus on controlling your diabetes during the weeks before surgery. . Make sure that the doctor who takes care of your diabetes knows about your planned surgery including the date and location.  How do I manage my blood sugar before surgery? . Check your blood sugar at least 4 times a day, starting 2 days before surgery, to make sure that the level is not too high or low. . Check your blood sugar the morning of your surgery when you wake up and every 2 hours until you get to the Short Stay unit. o If your blood sugar is less than 70 mg/dL, you will need to treat for low blood sugar: - Do not take insulin. - Treat a low blood sugar (less than 70 mg/dL) with  cup of clear juice (cranberry or apple), 4 glucose tablets, OR glucose gel. - Recheck blood sugar in 15 minutes after treatment (to make sure it is greater than 70 mg/dL). If your blood sugar is not greater than 70 mg/dL on recheck, call [phone] for further instructions. . Report your blood sugar to the short stay nurse when you get to Short Stay.  . If you are admitted to the hospital after surgery: o Your blood sugar will be checked by the staff and you will probably be given insulin after surgery (instead of oral diabetes medicines) to make  sure you have good blood sugar levels. o The goal for blood sugar control after surgery is 80-180 mg/dL.   As of today, STOP taking any Aspirin (unless otherwise instructed by your surgeon) Aleve, Naproxen, Ibuprofen, Motrin, Advil, Goody's, BC's, all herbal medications, fish oil, and all vitamins.                      Do not wear jewelry, make up, or nail polish            Do not wear lotions, powders, perfumes/colognes, or deodorant.            Do not shave 48 hours prior to surgery.  Men may shave face and neck.            Do not bring valuables to the hospital.             [facility] is not responsible for any belongings or valuables.  Do NOT Smoke (Tobacco/Vaping) or drink Alcohol 24 hours prior to your procedure If you use a CPAP at night, you may bring all equipment for your overnight stay.   Contacts, glasses, dentures or bridgework may not be worn into surgery.      For patients admitted to the hospital, discharge time will be determined by your treatment team.   Patients discharged the day of surgery will not be allowed to drive home, and someone needs to stay with them for 24 hours.    Special instructions:   [place]- Preparing For Surgery  Before surgery, you can play an important role. Because skin is not sterile, your skin needs to be as free of germs as possible. You can reduce the number of germs on your skin by washing with CHG (chlorahexidine gluconate) Soap before surgery.  CHG is an antiseptic cleaner which kills germs and bonds with the skin to continue killing germs even after washing.    Oral Hygiene is also important to reduce your risk of infection.  Remember - BRUSH YOUR TEETH THE MORNING OF SURGERY WITH YOUR REGULAR TOOTHPASTE  Please do not use if you have an allergy to CHG or antibacterial soaps. If your skin becomes reddened/irritated stop using the CHG.  Do not shave (including legs and underarms) for at least 48 hours prior to first CHG shower. It is OK to shave your face.  Please follow these instructions carefully.   1. Shower the NIGHT BEFORE SURGERY and the MORNING OF SURGERY with CHG Soap.   2. If you chose to wash your hair, wash your hair first as usual with your normal shampoo.  3. After you shampoo, rinse your hair and body thoroughly to remove the shampoo.  4. Use CHG as you would any other liquid soap. You can apply CHG directly to the skin and wash gently with a scrungie or a clean washcloth.   5. Apply the CHG Soap to your body ONLY FROM THE NECK DOWN.  Do not use on open wounds or open sores.  Avoid contact with your eyes, ears, mouth and genitals (private parts). Wash Face and genitals (private parts)  with your normal soap.   6. Wash thoroughly, paying special attention to the area where your surgery will be performed.  7. Thoroughly rinse your body with warm water from the neck down.  8. DO NOT shower/wash with your normal soap after using and rinsing off the CHG Soap.  9. Pat yourself dry with a CLEAN TOWEL.  10. Wear CLEAN  PAJAMAS to bed the night before surgery  11. Place CLEAN SHEETS on your bed the night of your first shower and DO NOT SLEEP WITH PETS.   Day of Surgery: Wear Clean/Comfortable clothing the morning of surgery Do not apply any deodorants/lotions.   Remember to brush your teeth WITH YOUR REGULAR TOOTHPASTE.   Please read over the following fact sheets that you were given.

## 2020-11-10 ENCOUNTER — Inpatient Hospital Stay (HOSPITAL_COMMUNITY): Admission: RE | Admit: 2020-11-10 | Payer: Medicaid Other | Source: Ambulatory Visit

## 2020-11-10 ENCOUNTER — Encounter (HOSPITAL_COMMUNITY)
Admission: RE | Admit: 2020-11-10 | Discharge: 2020-11-10 | Disposition: A | Discharge status: Home / Self Care | Payer: Medicaid Other | Source: Ambulatory Visit | Attending: Thoracic Surgery (Cardiothoracic Vascular Surgery) | Admitting: Thoracic Surgery (Cardiothoracic Vascular Surgery)

## 2020-11-10 ENCOUNTER — Other Ambulatory Visit: Payer: Self-pay | Admitting: *Deleted

## 2020-11-10 DIAGNOSIS — J9859 Other diseases of mediastinum, not elsewhere classified: Secondary | ICD-10-CM

## 2020-11-10 DIAGNOSIS — R599 Enlarged lymph nodes, unspecified: Secondary | ICD-10-CM

## 2020-11-10 NOTE — Progress Notes (Signed)
Pt a no show for PAT appointment. Pt resides at [facility]. Transportation was not set up for patient for appointment. Darius Bump, RN for TCTS made aware.   Viviano Simas, RN

## 2020-11-11 ENCOUNTER — Encounter (HOSPITAL_COMMUNITY): Payer: Self-pay | Admitting: Thoracic Surgery (Cardiothoracic Vascular Surgery)

## 2020-11-11 NOTE — Pre-Procedure Instructions (Signed)
Charles [name]  11/11/2020    Mr. Umscheid's procedure is scheduled on Thursday, Jan. 6, 2022 at 12:25 PM.   Report to [facility] Entrance "A" Admitting Office at 10:00 AM.   Call this number if you have problems the morning of surgery: [phone]   Remember:  [name] not to eat or drink after midnight Wednesday, 11/12/20.  Take these medicines the morning of surgery with A SIP OF WATER: Atorvastatin (Lipitor), Cyclobenzaprine (Flexeril), Digoxin (Lanoxin), Levothyroxine (Synthroid), Tamsulosin (Flomax)  Stop Melatonin as of tonight prior to surgery.   Hold Marcelline Deist (diabetic medication) Wednesday (11/12/20) and Thursday (11/13/20). Morning of surgery, give patient 1/2 of his regular dose of Levemir Insulin, dose will be 12 units. Please check patients blood sugar when he awakens the morning of surgery and every 2 hours until he leaves for the hospital on Thursday. If blood sugar is 70 or below, treat with 1/2 cup of clear juice (apple or cranberry) and recheck blood sugar 15 minutes after drinking juice. If blood sugar continues to be 70 or below, call the Short Stay department and ask to speak to a nurse.  Per instructions Dr. Eilene Ghazi, Anesthesiologist/Teresa [employer], RN     Do not wear jewelry.  Do not wear lotions, powders, cologne or deodorant.  Men may shave face and neck.  Do not bring valuables to the hospital.  [facility] is not responsible for any belongings or valuables.  Contacts, dentures or bridgework may not be worn into surgery.  Leave your suitcase in the car.  After surgery it may be brought to your room.  For patients admitted to the hospital, discharge time will be determined by your treatment team.

## 2020-11-11 NOTE — Progress Notes (Addendum)
Pt lives at [facility]. I spoke with [place], Med tech for pre-op call. Pt was sitting beside [place] while we spoke. Pt is diabetic. Last A1C was 8.1 on 10/11/20. Devon states pt's fasting blood sugar is usually around 80.   Faxed surgery instructions to Devin at [phone]. Please see that note also.  Devin states pt will go for his Covid test tomorrow and they can quarantine him until surgery.

## 2020-11-12 ENCOUNTER — Encounter (HOSPITAL_COMMUNITY): Payer: Self-pay | Admitting: Physician Assistant

## 2020-11-12 ENCOUNTER — Other Ambulatory Visit (HOSPITAL_COMMUNITY)
Admission: RE | Admit: 2020-11-12 | Discharge: 2020-11-12 | Disposition: A | Discharge status: Home / Self Care | Payer: Medicaid Other | Source: Ambulatory Visit | Attending: Thoracic Surgery (Cardiothoracic Vascular Surgery) | Admitting: Thoracic Surgery (Cardiothoracic Vascular Surgery)

## 2020-11-12 DIAGNOSIS — Z01818 Encounter for other preprocedural examination: Secondary | ICD-10-CM | POA: Insufficient documentation

## 2020-11-12 DIAGNOSIS — U071 COVID-19: Secondary | ICD-10-CM | POA: Insufficient documentation

## 2020-11-12 LAB — SARS CORONAVIRUS 2 (TAT 6-24 HRS): SARS Coronavirus 2: POSITIVE — AB

## 2020-11-12 MED ORDER — VANCOMYCIN HCL 1500 MG/300ML IV SOLN
1500.0000 mg | INTRAVENOUS | Status: AC
  Filled 2020-11-12: qty 300

## 2020-11-12 NOTE — Telephone Encounter (Signed)
Entresto appeal processed and has now been approved through insurance. Copay $0 per month.

## 2020-11-13 ENCOUNTER — Other Ambulatory Visit: Payer: Self-pay | Admitting: *Deleted

## 2020-11-13 ENCOUNTER — Inpatient Hospital Stay: Payer: Medicaid Other

## 2020-11-13 ENCOUNTER — Inpatient Hospital Stay: Payer: Medicaid Other | Admitting: Hematology and Oncology

## 2020-11-13 ENCOUNTER — Inpatient Hospital Stay (HOSPITAL_COMMUNITY)
Admission: RE | Admit: 2020-11-13 | Payer: Medicaid Other | Source: Home / Self Care | Admitting: Thoracic Surgery (Cardiothoracic Vascular Surgery)

## 2020-11-13 ENCOUNTER — Encounter: Payer: Self-pay | Admitting: *Deleted

## 2020-11-13 DIAGNOSIS — Q214 Aortopulmonary septal defect: Secondary | ICD-10-CM

## 2020-11-13 DIAGNOSIS — J9859 Other diseases of mediastinum, not elsewhere classified: Secondary | ICD-10-CM

## 2020-11-13 DIAGNOSIS — R599 Enlarged lymph nodes, unspecified: Secondary | ICD-10-CM

## 2020-11-13 HISTORY — DX: Unspecified osteoarthritis, unspecified site: M19.90

## 2020-11-13 SURGERY — THORACOSCOPY, ROBOT-ASSISTED
Anesthesia: General | Site: Chest | Laterality: Left

## 2020-11-13 NOTE — Progress Notes (Signed)
Dr Dorris Fetch notified of positive result. Case to be cancelled for today and  rescheduled  after quarantine period. Call placed to Lolita Cram at [facility] to inform of cancellation and positive result and to reach out to the office to reschedule. OR desk notified, CRNA notified.

## 2020-11-26 NOTE — Progress Notes (Signed)
[id number] [place], [place] [address] Phone: [phone] Fax: [phone]  [facility] [place], [place] - [address] [place] [address] Phone: [phone] Fax: [phone]      Your procedure is scheduled on 12/01/20.  Report to [facility] Main Entrance "A" at 5:30 A.M., and check in at the Admitting office.  Call this number if you have problems the morning of surgery:  [phone]  Call [phone] if you have any questions prior to your surgery date Monday-Friday 8am-4pm    Remember:  Do not eat or drink after midnight the night before your surgery     Take these medicines the morning of surgery with A SIP OF WATER  atorvastatin (LIPITOR) brimonidine (ALPHAGAN) eye drops latanoprost (XALATAN) eye drops cyclobenzaprine (FLEXERIL)  digoxin (LANOXIN) levothyroxine (SYNTHROID)  As of today, STOP taking any Aspirin (unless otherwise instructed by your surgeon) Aleve, Naproxen, Ibuprofen, Motrin, Advil, Goody's, BC's, all herbal medications, fish oil, and all vitamins.  WHAT DO I DO ABOUT MY DIABETES MEDICATION?   Marland Kitchen Do not take oral diabetes medicines (pills):  the morning of surgery.  . THE DAY BEFORE SURGERY, do not take your dapagliflozin propanediol (FARXIGA). Take 25 units of insulin detemir (LEVEMIR) if taken in the morning. Take 12 units of insulin detemir (LEVEMIR) if taken at night.    . THE MORNING OF SURGERY, do not take your dapagliflozin propanediol (FARXIGA). Take 12 units of insulin detemir (LEVEMIR).  . The day of surgery, do not take other diabetes injectables, including Byetta (exenatide), Bydureon (exenatide ER), Victoza (liraglutide), or Trulicity (dulaglutide).   HOW TO MANAGE YOUR DIABETES BEFORE AND AFTER SURGERY  Why is it important to control my blood sugar before and after  surgery? . Improving blood sugar levels before and after surgery helps healing and can limit problems. . A way of improving blood sugar control is eating a healthy diet by: o  Eating less sugar and carbohydrates o  Increasing activity/exercise o  Talking with your doctor about reaching your blood sugar goals . High blood sugars (greater than 180 mg/dL) can raise your risk of infections and slow your recovery, so you will need to focus on controlling your diabetes during the weeks before surgery. . Make sure that the doctor who takes care of your diabetes knows about your planned surgery including the date and location.  How do I manage my blood sugar before surgery? . Check your blood sugar at least 4 times a day, starting 2 days before surgery, to make sure that the level is not too high or low. o Check your blood sugar the morning of your surgery when you wake up and every 2 hours until you get to the Short Stay unit. . If your blood sugar is less than 70 mg/dL, you will need to treat for low blood sugar: o Do not take insulin. o Treat a low blood sugar (less than 70 mg/dL) with  cup of clear juice (cranberry or apple), 4 glucose tablets, OR glucose gel. Recheck blood sugar in 15 minutes after treatment (to make sure it is greater than 70 mg/dL). If your blood sugar is not greater than 70 mg/dL on recheck, call [phone] o  for further instructions. . Report your blood sugar to the short stay nurse when you get to Short Stay.  . If you are admitted to  the hospital after surgery: o Your blood sugar will be checked by the staff and you will probably be given insulin after surgery (instead of oral diabetes medicines) to make sure you have good blood sugar levels. o The goal for blood sugar control after surgery is 80-180 mg/dL.                         Do not wear jewelry, make up, or nail polish            Do not wear lotions, powders, perfumes/colognes, or deodorant.            Do  not shave 48 hours prior to surgery.  Men may shave face and neck.            Do not bring valuables to the hospital.            [facility] is not responsible for any belongings or valuables.  Do NOT Smoke (Tobacco/Vaping) or drink Alcohol 24 hours prior to your procedure If you use a CPAP at night, you may bring all equipment for your overnight stay.   Contacts, glasses, dentures or bridgework may not be worn into surgery.      For patients admitted to the hospital, discharge time will be determined by your treatment team.   Patients discharged the day of surgery will not be allowed to drive home, and someone needs to stay with them for 24 hours.    Special instructions:   - Preparing For Surgery  Before surgery, you can play an important role. Because skin is not sterile, your skin needs to be as free of germs as possible. You can reduce the number of germs on your skin by washing with CHG (chlorahexidine gluconate) Soap before surgery.  CHG is an antiseptic cleaner which kills germs and bonds with the skin to continue killing germs even after washing.    Oral Hygiene is also important to reduce your risk of infection.  Remember - BRUSH YOUR TEETH THE MORNING OF SURGERY WITH YOUR REGULAR TOOTHPASTE  Please do not use if you have an allergy to CHG or antibacterial soaps. If your skin becomes reddened/irritated stop using the CHG.  Do not shave (including legs and underarms) for at least 48 hours prior to first CHG shower. It is OK to shave your face.  Please follow these instructions carefully.   1. Shower the NIGHT BEFORE SURGERY and the MORNING OF SURGERY with CHG Soap.   2. If you chose to wash your hair, wash your hair first as usual with your normal shampoo.  3. After you shampoo, rinse your hair and body thoroughly to remove the shampoo.  4. Use CHG as you would any other liquid soap. You can apply CHG directly to the skin and wash gently with a scrungie or a clean  washcloth.   5. Apply the CHG Soap to your body ONLY FROM THE NECK DOWN.  Do not use on open wounds or open sores. Avoid contact with your eyes, ears, mouth and genitals (private parts). Wash Face and genitals (private parts)  with your normal soap.   6. Wash thoroughly, paying special attention to the area where your surgery will be performed.  7. Thoroughly rinse your body with warm water from the neck down.  8. DO NOT shower/wash with your normal soap after using and rinsing off the CHG Soap.  9. Pat yourself dry with a CLEAN TOWEL.  10. Wear CLEAN PAJAMAS to bed the night before surgery  11. Place CLEAN SHEETS on your bed the night of your first shower and DO NOT SLEEP WITH PETS.   Day of Surgery: Take a shower.  Wear Clean/Comfortable clothing the morning of surgery Do not apply any deodorants/lotions.   Remember to brush your teeth WITH YOUR REGULAR TOOTHPASTE.   Please read over the following fact sheets that you were given.

## 2020-11-27 ENCOUNTER — Other Ambulatory Visit: Payer: Self-pay

## 2020-11-27 ENCOUNTER — Ambulatory Visit (HOSPITAL_COMMUNITY)
Admission: RE | Admit: 2020-11-27 | Discharge: 2020-11-27 | Disposition: A | Discharge status: Home / Self Care | Payer: Medicaid Other | Source: Ambulatory Visit | Attending: Thoracic Surgery (Cardiothoracic Vascular Surgery) | Admitting: Thoracic Surgery (Cardiothoracic Vascular Surgery)

## 2020-11-27 ENCOUNTER — Encounter (HOSPITAL_COMMUNITY)
Admission: RE | Admit: 2020-11-27 | Discharge: 2020-11-27 | Disposition: A | Discharge status: Home / Self Care | Payer: Medicaid Other | Source: Ambulatory Visit | Attending: Thoracic Surgery (Cardiothoracic Vascular Surgery) | Admitting: Thoracic Surgery (Cardiothoracic Vascular Surgery)

## 2020-11-27 ENCOUNTER — Encounter (HOSPITAL_COMMUNITY): Payer: Self-pay

## 2020-11-27 DIAGNOSIS — Q214 Aortopulmonary septal defect: Secondary | ICD-10-CM | POA: Diagnosis present

## 2020-11-27 DIAGNOSIS — Z01818 Encounter for other preprocedural examination: Secondary | ICD-10-CM | POA: Diagnosis present

## 2020-11-27 HISTORY — DX: Other cardiomyopathies: I42.8

## 2020-11-27 HISTORY — DX: Heart failure, unspecified: I50.9

## 2020-11-27 HISTORY — DX: Acute myocardial infarction, unspecified: I21.9

## 2020-11-27 LAB — COMPREHENSIVE METABOLIC PANEL
ALT: 36 U/L (ref 0–44)
AST: 37 U/L (ref 15–41)
Albumin: 3.7 g/dL (ref 3.5–5.0)
Alkaline Phosphatase: 126 U/L (ref 38–126)
Anion gap: 14 (ref 5–15)
BUN: 15 mg/dL (ref 6–20)
CO2: 21 mmol/L — ABNORMAL LOW (ref 22–32)
Calcium: 9.2 mg/dL (ref 8.9–10.3)
Chloride: 101 mmol/L (ref 98–111)
Creatinine, Ser: 1.28 mg/dL — ABNORMAL HIGH (ref 0.61–1.24)
GFR, Estimated: >60 mL/min (ref >60)
Glucose, Bld: 121 mg/dL — ABNORMAL HIGH (ref 70–99)
Potassium: 4.1 mmol/L (ref 3.5–5.1)
Sodium: 136 mmol/L (ref 135–145)
Total Bilirubin: 0.4 mg/dL (ref 0.3–1.2)
Total Protein: 7.6 g/dL (ref 6.5–8.1)

## 2020-11-27 LAB — URINALYSIS, ROUTINE W REFLEX MICROSCOPIC
Bacteria, UA: NONE SEEN
Bilirubin Urine: NEGATIVE
Glucose, UA: >=500 mg/dL — AB
Hgb urine dipstick: NEGATIVE
Ketones, ur: NEGATIVE mg/dL
Leukocytes,Ua: NEGATIVE
Nitrite: NEGATIVE
Protein, ur: NEGATIVE mg/dL
Specific Gravity, Urine: 1.02 (ref 1.005–1.030)
pH: 5 (ref 5.0–8.0)

## 2020-11-27 LAB — SURGICAL PCR SCREEN
MRSA, PCR: NEGATIVE
Staphylococcus aureus: NEGATIVE

## 2020-11-27 LAB — CBC
HCT: 48.6 % (ref 39.0–52.0)
Hemoglobin: 15.4 g/dL (ref 13.0–17.0)
MCH: 25.2 pg — ABNORMAL LOW (ref 26.0–34.0)
MCHC: 31.7 g/dL (ref 30.0–36.0)
MCV: 79.4 fL — ABNORMAL LOW (ref 80.0–100.0)
Platelets: 412 10*3/uL — ABNORMAL HIGH (ref 150–400)
RBC: 6.12 MIL/uL — ABNORMAL HIGH (ref 4.22–5.81)
RDW: 18.4 % — ABNORMAL HIGH (ref 11.5–15.5)
WBC: 13.1 10*3/uL — ABNORMAL HIGH (ref 4.0–10.5)
nRBC: 0 % (ref 0.0–0.2)

## 2020-11-27 LAB — TYPE AND SCREEN
ABO/RH(D): O POS
Antibody Screen: NEGATIVE

## 2020-11-27 LAB — APTT: aPTT: 32 seconds (ref 24–36)

## 2020-11-27 LAB — PROTIME-INR
INR: 1.1 (ref 0.8–1.2)
Prothrombin Time: 13.4 seconds (ref 11.4–15.2)

## 2020-11-27 LAB — GLUCOSE, CAPILLARY: Glucose-Capillary: 112 mg/dL — ABNORMAL HIGH (ref 70–99)

## 2020-11-27 NOTE — Progress Notes (Signed)
ABG clotted.  Phlebotomist Caryl Pina spoke to Charles [name] who stated they will get the ABG on day of surgery when they get the A line.  Patient was a very hard stick.

## 2020-11-27 NOTE — Progress Notes (Signed)
PCP - No PCP Cardiologist - Denies  Chest x-ray - Day of surgery 12/01/20 EKG - 11/27/20 Stress Test - Denies ECHO - 10/11/20 Cardiac Cath - 10/14/20  Sleep Study - Denies  DM type II  CBG @ PAT 112 Blood Sugar - 120's-140's average Checks Blood Sugar daily  COVID TEST-  Not needed tested positive within 90 days   Anesthesia review: Yes cardiac history  Patient denies shortness of breath, fever, cough and chest pain at PAT appointment   All instructions explained to the patient, with a verbal understanding of the material. Patient agrees to go over the instructions while at home for a better understanding. Patient also instructed to self quarantine after being tested for COVID-19. The opportunity to ask questions was provided.

## 2020-11-27 NOTE — Progress Notes (Signed)
Patient tested positive for COVID 2 weeks no need to be retested before surgery

## 2020-11-27 NOTE — Progress Notes (Addendum)
Anesthesia PAT Evaluation:  Case: [id number] Date/Time: 12/01/20 0715   Procedures:      XI ROBOTIC ASSISTED THORASCOPY FOR BIOPSY AP WINDOW LYMPH NODES (Left Chest)     LYMPH NODE BIOPSY (Left )   Anesthesia type: General   Pre-op diagnosis: AP WINDOW ADENOPATHY   Location: [facility] OR ROOM 10 / [facility]   Surgeons: Melrose Nakayama, MD      DISCUSSION: [name] a 56 year old male scheduled for the above procedure. He was found to have mediastinal lymphadenopathy on 07/18/20 CTA chest done at [facility] for as part of LE edema evaluation. He was subsequently referred to oncology and CT surgery. Two admissions in December with new diagnosis of acute systolic CHF with EF 04% and then for suicidal ideation. He was then scheduled for surgery on 11/13/20, but 11/12/20 COVID-19 test came back positive.  History includes smoking (~3-4 cig/day), HTN, DM2, Bipolar 1 disorder, nonischemic cardiomyopathy (no CAD, EF 54% 07/8118), systolic CHF (acute 14/7829), back surgery (~ 5/62/13, complicated by hematoma requiring take back). Prior polysubstance abuse (cocaine--will be nine years clean in 01/2021; denied current alcohol use).   He had HEM-ONC evaluation by Dr. Lorenso Courier on 09/25/20 for transiently elevated PTT with recent post-operative hematoma following April 2021 back surgery and for finding of mediastinal lymphadenopathy on 07/18/20 CTA. Subsequent PTTs were normal, but given anticipated plans for biopsy additional testing ordered. Based on notes and [facility] labs results, work-up for elevated PTT included:  1) 02/2020: patient developed an extensive hematoma following a decompression procedure with neurosurgery. Reported to have an elevated PTT at that time.  2) 02/26/2020: Pt 11.6, INR 0.98, aPTT 41.8  3) 02/27/2020: aPTT 34.4, Factor VIII 218, vWF activty 130%, Factor IX 211%, Factor XI 165% 4) 09/25/2020:establish care with Dr. Lorenso Courier. 09/25/2020 labs: LDH 222 (98-192 u/L), flow cytometry, PT 12.4, INR 1.0,  aPTT 29, uric acid 9.3 (3.7-8.6 mg/dL), thrombin time 20.9 sec (0.0-23.0 sec), platelet function assay interpreted as normal (collagen/epinephrine 154 [0-193 second]), flow cytometry showed no monoclonal B-cell population or significant T-cell abnormalities identified.   - ED stay 10/28/20-11/03/20 for suicidal thoughts. Notes suggest he had either run out of psychiatric medications or felt like facility was not administering correctly. Psychiatry consulted. Discharged back to [facility] once cleared.  - Admission 10/11/15-/10/23/20 for dyspnea and tachycardia. He required 2L/O2. Chest CT negative for PE, but working mediastinal lymphadenopathy (had pending CT surgery evaluation at that time). Echo showed LVEF 20%. Cardiology consulted. Normal coronaries by cath. Started on HF medications. DM meds adjusted for A1c 8.1.    As of 11/04/20, Dr Roxan Hockey classified his Zubrod Score as 1: Restricted in physical strenuous activity but ambulatory, able to do out light work.  I evaluated Mr. Widener at his PAT visit given reports of intermittently coughing, COVID-19 + test 11/12/20, and with tachycardia. Provider wore N95, universal mask, face shield, gloves. Patient wore universal face mask. He did not appear in any acute distress. He noted that intermittent cough present since his was initially diagnosed with mediastinal lymphadenopathy a few months ago. It was initially somewhat productive, but now mostly dry cough. In the last 3-4 weeks coughing seems more often, but still describes as only occasional (not persistent or keeping him up at night). No orthopnea or new edema. Says weight is down about 25 lbs in the past several months since being on diuretics. He feels like he fatigues more easily, but says she was able to walk from the hospital entrance  down the hallway to PAT without having to stop and rest using a slow, steady pace and his rollator walker. He notes his HR has been fast since he was in the hospital in  December. Occasionally he will notice some flutters that last a few minutes. May have an occasional brief sharp chest pain. No syncope/presncope. Says he's had 2 episodes in the last few months were he feels like he can't breath for a few seconds, but in general not having more SOB. No fever. He says he never felt sick or different after his + COVID-19 test on 11/12/20. Heart regular rhythm, tachycardiac at 122 bpm. No murmur noted. Lungs diminished, particularly bases, but no wheezes or rhonchi noted. Some generalized LE edema, but no real pitting edema noted.   Patient resides at [facility]. He says a medical provider comes to the facility monthly (has been [employer]). Staff administer his medications. He says CBGs have been ~ 140's. I called and spoke with Devin at [facility] since several of Mr. Riling's medication were listed as expired. Devin confirmed that Mr. Marsala is still on on Entreto 24/26 mg, digoxin 0.125 mg, furosemide 40 mg, Farxiga 10 mg, spironolactone 25 mg, metoprolol 50 mg.   I called and spoke with [facility] RN Ryan about patient's persistent tachycardia and mild increase in cough and fatigue over the past 3-4 weeks. Despite + COVID test on 11/13/19, he does not feel ill and other tan rare palpitations lasting a few minutes has not been symptomatic of his tachycardia. No pitting edema on exam. No wheezing or crackles noted. As above, he remains on HF medications including d no rales noted. RA O2 sat 97%. BP 103/80. RR 20. Request for CXR to be done at PAT. She will have Dr. Roxan Hockey review.  ADDENDUM 11/28/20 4:36 PM:  11/27/20 CXR report reviewed. Increased reticulonodular opacities noted, particularly in lower lungs concerning for multifocal infection. He denied fever or productive cough. He did have a + COVID-19 test on 11/12/20. Dr. Roxan Hockey reviewed, and surgery to be postponed until 12/18/20.       VS: BP 103/80    Pulse (!) 125    Temp 36.7 C  (Oral)    Resp 20    Ht $R'5\' 9"'bV$  (1.753 m)    SpO2 97%    BMI 39.43 kg/m  HR 122 at 11/04/20 visit with Dr. Roxan Hockey.    PROVIDERS: Sheran Spine, MD is HEM-ONC [name], MD is cardiologist. Initially consulted 10/12/20 during CHF admission. Last visit 10/23/20 by Lyman Bishop, MD prior to discharge home. Tachycardia overall improved on Toprol. Continue medical therapy for CHF. Patient was awaiting plans for biopsy for mediastinal adenopathy. Future out-patient follow-up planned with Dr. Harrell Gave, but has not yet been scheduled.    LABS: Preoperative labs noted.  (all labs ordered are listed, but only abnormal results are displayed)  Labs Reviewed  GLUCOSE, CAPILLARY - Abnormal; Notable for the following components:      Result Value   Glucose-Capillary 112 (*)    All other components within normal limits  CBC - Abnormal; Notable for the following components:   WBC 13.1 (*)    RBC 6.12 (*)    MCV 79.4 (*)    MCH 25.2 (*)    RDW 18.4 (*)    Platelets 412 (*)    All other components within normal limits  COMPREHENSIVE METABOLIC PANEL - Abnormal; Notable for the following components:   CO2  21 (*)    Glucose, Bld 121 (*)    Creatinine, Ser 1.28 (*)    All other components within normal limits  URINALYSIS, ROUTINE W REFLEX MICROSCOPIC - Abnormal; Notable for the following components:   Glucose, UA >=500 (*)    All other components within normal limits  SURGICAL PCR SCREEN  PROTIME-INR  APTT  TYPE AND SCREEN     IMAGES: CXR 11/27/20:  FINDINGS: - Cardiomediastinal silhouette unchanged in size and contour. Reticulonodular opacities of the lungs, with increasing patchy airspace densities in the left lung. - No pneumothorax or pleural effusion.  No displaced fracture IMPRESSION: Increased reticulonodular opacities particularly in the lower lungs concerning for multifocal infection.  [place] Abd 10/11/20: IMPRESSION: No ascites is identified.  CTA Chest  10/10/20: IMPRESSION: 1. No evidence of acute pulmonary embolus. 2. Moderate cardiomegaly. 3. Worsening mediastinal adenopathy as previously evaluated on the patient's PET-CT.  PET Scan 09/23/20: IMPRESSION: 1. Prevascular adenopathy with individual nodes measuring up to 2.4 cm in diameter, maximum SUV 6.4 (Deauville 4 activity). 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 No adenopathy or suspicious hypermetabolic activity in the neck, abdomen/pelvis, or skeleton. 2. Hepatic steatosis. 3. Nonobstructive left nephrolithiasis. 4. Prostatomegaly. 5.  Aortic Atherosclerosis (ICD10-I70.0).   EKG: EKG 11/27/20: Ventricular rate @ 121 bpm Sinus tachycardia Left axis deviation Nonspecific T wave abnormality Abnormal ECG No significant change since last tracing Confirmed by Pattricia Boss [phone]) on 11/28/2020 12:21:52 PM  EKG 10/18/20:  Ventricular rate @ 123 bpm Sinus tachycardia with occasional Premature ventricular complexes Minimal voltage criteria for LVH, may be normal variant ( Cornell product ) Nonspecific T wave abnormality Abnormal ECG Compared to previous tracing the rate is faster Confirmed by Lyman Bishop [phone]) on 10/20/2020 4:22:14 PM   CV: RHC/LHC 25/9/56:  LV end diastolic pressure is severely elevated. LVEDP 29 mm Hg.  There is no aortic valve stenosis.  No angiographically apparent CAD.  Ao sat 98%, PA sat 69%, PA pressure 34/22, mean 26 mm Hg; mean PCWP 26 mm Hg; CO 4.96 L/min; CI 2.09 Continue medical therapy for nonischemic cardiomyopathy.    Echo 10/11/20: IMPRESSIONS  1. Left ventricular ejection fraction, by estimation, is 20 to 25%. The  left ventricle has severely decreased function. The left ventricle  demonstrates global hypokinesis. The left ventricular internal cavity size  was severely dilated. Left ventricular  diastolic parameters are indeterminate.  2. Right ventricular systolic function was not well  visualized. The right  ventricular size is not well visualized.  3. The mitral valve is normal in structure. No evidence of mitral valve  regurgitation.  4. The aortic valve was not well visualized. Aortic valve regurgitation  is not visualized. No aortic stenosis is present.    Past Medical History:  Diagnosis Date   Arthritis    lower back   Bipolar 1 disorder ([place])    DM2 (diabetes mellitus, type 2) ([place])    HTN (hypertension)     Past Surgical History:  Procedure Laterality Date   RIGHT/LEFT HEART CATH AND CORONARY ANGIOGRAPHY N/A 10/14/2020   Procedure: RIGHT/LEFT HEART CATH AND CORONARY ANGIOGRAPHY;  Surgeon: Jettie Booze, MD;  Location: [facility];  Service: Cardiovascular;  Laterality: N/A;   SPINAL FIXATION SURGERY  02/26/2020    MEDICATIONS:  atorvastatin (LIPITOR) 10 MG tablet   blood glucose meter kit and supplies KIT   brimonidine (ALPHAGAN) 0.2 % ophthalmic solution   cyclobenzaprine (FLEXERIL) 10 MG tablet   dapagliflozin propanediol (FARXIGA) 10  MG TABS tablet   digoxin (LANOXIN) 0.125 MG tablet   furosemide (LASIX) 40 MG tablet   insulin detemir (LEVEMIR) 100 UNIT/ML FlexPen   Insulin Pen Needle (PEN NEEDLES 29GX1/2") 29G X 12MM MISC   latanoprost (XALATAN) 0.005 % ophthalmic solution   levothyroxine (SYNTHROID) 100 MCG tablet   melatonin 3 MG TABS tablet   metoprolol succinate (TOPROL-XL) 50 MG 24 hr tablet   Paliperidone Palmitate (INVEGA SUSTENNA IM)   polyethylene glycol (MIRALAX / GLYCOLAX) 17 g packet   risperiDONE (RISPERDAL) 0.5 MG tablet   senna (SENOKOT) 8.6 MG TABS tablet   spironolactone (ALDACTONE) 25 MG tablet   tamsulosin (FLOMAX) 0.4 MG CAPS capsule   No current facility-administered medications for this encounter.    Myra Gianotti, PA-C Surgical Short Stay/Anesthesiology [facility] Phone [phone] [facility] Phone [phone] 11/27/2020 6:20 PM

## 2020-11-28 ENCOUNTER — Encounter: Payer: Self-pay | Admitting: *Deleted

## 2020-11-28 ENCOUNTER — Other Ambulatory Visit: Payer: Self-pay | Admitting: *Deleted

## 2020-11-28 DIAGNOSIS — Q214 Aortopulmonary septal defect: Secondary | ICD-10-CM

## 2020-11-28 MED ORDER — VANCOMYCIN HCL 1500 MG/300ML IV SOLN
1500.0000 mg | INTRAVENOUS | Status: AC
  Filled 2020-11-28: qty 300

## 2020-11-28 NOTE — Anesthesia Preprocedure Evaluation (Addendum)
Anesthesia Evaluation  Patient identified by MRN, date of birth, ID band Patient awake    Reviewed: Allergy & Precautions, H&P , NPO status , Patient's Chart, lab work & pertinent test results  Airway Mallampati: II   Neck ROM: full    Dental   Pulmonary shortness of breath, Current Smoker,    breath sounds clear to auscultation       Cardiovascular hypertension, +CHF   Rhythm:regular Rate:Normal  NICM EF 20-25%   Neuro/Psych PSYCHIATRIC DISORDERS Depression Bipolar Disorder    GI/Hepatic   Endo/Other  diabetes, Type 2  Renal/GU      Musculoskeletal  (+) Arthritis ,   Abdominal   Peds  Hematology   Anesthesia Other Findings   Reproductive/Obstetrics                            Anesthesia Physical Anesthesia Plan  ASA: III  Anesthesia Plan: General   Post-op Pain Management:    Induction: Intravenous  PONV Risk Score and Plan: 1 and Ondansetron, Dexamethasone, Midazolam and Treatment may vary due to age or medical condition  Airway Management Planned: Double Lumen EBT  Additional Equipment: Arterial line, CVP and Ultrasound Guidance Line Placement  Intra-op Plan:   Post-operative Plan: Extubation in OR  Informed Consent: I have reviewed the patients History and Physical, chart, labs and discussed the procedure including the risks, benefits and alternatives for the proposed anesthesia with the patient or authorized representative who has indicated his/her understanding and acceptance.     Dental advisory given  Plan Discussed with: CRNA, Anesthesiologist and Surgeon  Anesthesia Plan Comments: (PAT note written by Myra Gianotti, PA-C. + COVID-19 test 11/12/20. )       Anesthesia Quick Evaluation

## 2020-11-29 ENCOUNTER — Emergency Department (HOSPITAL_COMMUNITY)
Admission: EM | Admit: 2020-11-29 | Discharge: 2020-11-30 | Disposition: A | Discharge status: Home / Self Care | Payer: Medicaid Other | Attending: Emergency Medicine | Admitting: Emergency Medicine

## 2020-11-29 ENCOUNTER — Emergency Department (HOSPITAL_COMMUNITY): Payer: Medicaid Other

## 2020-11-29 ENCOUNTER — Other Ambulatory Visit: Payer: Self-pay

## 2020-11-29 ENCOUNTER — Encounter (HOSPITAL_COMMUNITY): Payer: Self-pay

## 2020-11-29 DIAGNOSIS — F17210 Nicotine dependence, cigarettes, uncomplicated: Secondary | ICD-10-CM | POA: Insufficient documentation

## 2020-11-29 DIAGNOSIS — I5021 Acute systolic (congestive) heart failure: Secondary | ICD-10-CM | POA: Diagnosis not present

## 2020-11-29 DIAGNOSIS — U071 COVID-19: Secondary | ICD-10-CM | POA: Diagnosis not present

## 2020-11-29 DIAGNOSIS — Z794 Long term (current) use of insulin: Secondary | ICD-10-CM | POA: Diagnosis not present

## 2020-11-29 DIAGNOSIS — I110 Hypertensive heart disease with heart failure: Secondary | ICD-10-CM | POA: Diagnosis not present

## 2020-11-29 DIAGNOSIS — Z79899 Other long term (current) drug therapy: Secondary | ICD-10-CM | POA: Insufficient documentation

## 2020-11-29 DIAGNOSIS — R0789 Other chest pain: Secondary | ICD-10-CM

## 2020-11-29 DIAGNOSIS — Z8616 Personal history of COVID-19: Secondary | ICD-10-CM | POA: Insufficient documentation

## 2020-11-29 DIAGNOSIS — R079 Chest pain, unspecified: Secondary | ICD-10-CM | POA: Diagnosis present

## 2020-11-29 DIAGNOSIS — R600 Localized edema: Secondary | ICD-10-CM | POA: Insufficient documentation

## 2020-11-29 DIAGNOSIS — E119 Type 2 diabetes mellitus without complications: Secondary | ICD-10-CM | POA: Insufficient documentation

## 2020-11-29 LAB — CBC
HCT: 45.5 % (ref 39.0–52.0)
Hemoglobin: 14.7 g/dL (ref 13.0–17.0)
MCH: 25.3 pg — ABNORMAL LOW (ref 26.0–34.0)
MCHC: 32.3 g/dL (ref 30.0–36.0)
MCV: 78.4 fL — ABNORMAL LOW (ref 80.0–100.0)
Platelets: 383 10*3/uL (ref 150–400)
RBC: 5.8 MIL/uL (ref 4.22–5.81)
RDW: 18.4 % — ABNORMAL HIGH (ref 11.5–15.5)
WBC: 11.9 10*3/uL — ABNORMAL HIGH (ref 4.0–10.5)
nRBC: 0 % (ref 0.0–0.2)

## 2020-11-29 LAB — BASIC METABOLIC PANEL
Anion gap: 11 (ref 5–15)
BUN: 8 mg/dL (ref 6–20)
CO2: 22 mmol/L (ref 22–32)
Calcium: 9.1 mg/dL (ref 8.9–10.3)
Chloride: 104 mmol/L (ref 98–111)
Creatinine, Ser: 1.16 mg/dL (ref 0.61–1.24)
GFR, Estimated: >60 mL/min (ref >60)
Glucose, Bld: 107 mg/dL — ABNORMAL HIGH (ref 70–99)
Potassium: 4.2 mmol/L (ref 3.5–5.1)
Sodium: 137 mmol/L (ref 135–145)

## 2020-11-29 LAB — PROTIME-INR
INR: 1 (ref 0.8–1.2)
Prothrombin Time: 13 seconds (ref 11.4–15.2)

## 2020-11-29 LAB — DIGOXIN LEVEL: Digoxin Level: 0.2 ng/mL — ABNORMAL LOW (ref 0.8–2.0)

## 2020-11-29 LAB — TROPONIN I (HIGH SENSITIVITY)
Troponin I (High Sensitivity): 14 ng/L (ref <18)
Troponin I (High Sensitivity): 15 ng/L (ref <18)

## 2020-11-29 MED ORDER — IOHEXOL 350 MG/ML SOLN
75.0000 mL | Freq: Once | INTRAVENOUS | Status: AC | PRN
  Administered 2020-11-29: 75 mL via INTRAVENOUS

## 2020-11-29 NOTE — ED Notes (Signed)
RN transported to CT

## 2020-11-29 NOTE — ED Triage Notes (Signed)
Pt BIB [facility] from [facility] for CP x1-2 days,  pain when he coughs.  Covid + 2 weeks ago, hospitalized approx 1 month, had a lot of fluid removed from around his heart. Pt also states he has some "bad spots" behind his heart, unsure if they are cancerous   BP 124/78 --> SBP 104 HR 120 98% RA

## 2020-11-29 NOTE — ED Notes (Signed)
Patient back from CT.

## 2020-11-29 NOTE — ED Provider Notes (Signed)
Patient signed out to me by Dr. Ralene Bathe to follow-up on CT scan.  Patient with recent COVID infection presents to the emergency department with cough and pain in his chest when he coughs.  CT scan shows changes consistent with recent COVID infection but no PE.  Blood work is all normal.  Upon recheck patient is sleeping comfortably.  He awakens easily and reports that he is feeling comfortable without any pain.  Does not require any further work-up at this time.  Vitals are unremarkable other than tachycardia which has been documented in the past.   Orpah Greek, MD 11/29/20 2359

## 2020-11-29 NOTE — ED Provider Notes (Signed)
[facility] EMERGENCY DEPARTMENT Provider Note   CSN: [id number] Arrival date & time: 11/29/20  1722     History Chief Complaint  Patient presents with  . Chest Pain    DEMARQUS JOCSON is a 55 y.o. male.  The history is provided by the patient and medical records.  Chest Pain  DIVON KRABILL is a 55 y.o. male who presents to the Emergency Department complaining of chest pain. He presents to the ED by EMS from [facility] for evaluation of chest pain and cough for the last few days.  Cough is productive of yellow sputum and chest pain is present with coughing and deep breath. He was diagnosed with COVID-19 on preoperative screening on January 5. He did not have symptoms at that time. He denies any fevers but does feel hot at times and has to go outside into the cold. He has a history of non-ischemic cardiomyopathy within the EF of 20 to 25% on an echo performed on December 4. He is also being evaluated by CT surgery and oncology for mediastinal lymphadenopathy with the scheduled biopsy for February 10. The biopsy was delayed due to his COVID-19 diagnosis. He is compliant with his medications and states that since being given the fluid medications he has lost significant weight. He previously had lower extremity edema but this is now resolved. He also states that at times he feels like his heart is beating quickly, does not currently feel like it is beating quickly. Symptoms are moderate, constant.    Past Medical History:  Diagnosis Date  . Arthritis    lower back  . Bipolar 1 disorder ([place])   . Cardiomyopathy, nonischemic ([place])    no CAD 10/14/20 cath, EF 20-25% by 10/11/20 echo  . CHF (congestive heart failure) ([place])   . COVID 10/2020  . DM2 (diabetes mellitus, type 2) ([place])   . HTN (hypertension)   . Myocardial infarction [facility])     Patient Active Problem List   Diagnosis Date Noted  . Adjustment disorder with mixed anxiety and depressed mood 10/30/2020   . Acute systolic heart failure ([place])   . Mediastinal lymphadenopathy 10/11/2020  . Shortness of breath 10/11/2020  . Sinus tachycardia 10/11/2020  . Night sweats 10/11/2020  . DM2 (diabetes mellitus, type 2) ([place]) 10/11/2020  . Lymphoma ([place]) 10/11/2020  . Hypertension 09/25/2020  . BPH (benign prostatic hyperplasia) 09/25/2020  . Insomnia 09/25/2020    Past Surgical History:  Procedure Laterality Date  . BACK SURGERY  01/2020  . CARDIAC CATHETERIZATION  10/2020  . HERNIA REPAIR    . RIGHT/LEFT HEART CATH AND CORONARY ANGIOGRAPHY N/A 10/14/2020   Procedure: RIGHT/LEFT HEART CATH AND CORONARY ANGIOGRAPHY;  Surgeon: Jettie Booze, MD;  Location: [facility];  Service: Cardiovascular;  Laterality: N/A;  . SPINAL FIXATION SURGERY  02/26/2020       Family History  Problem Relation Age of Onset  . Hypertension Mother   . Hyperlipidemia Mother   . Stroke Mother   . Cancer Mother   . Hypertension Father   . Hyperlipidemia Father   . Healthy Sister     Social History   Tobacco Use  . Smoking status: Current Every Day Smoker    Packs/day: 0.25    Types: Cigarettes  . Smokeless tobacco: Never Used  . Tobacco comment: occasionally  Vaping Use  . Vaping Use: Never used  Substance Use Topics  . Alcohol use: Never  . Drug use: Not  Currently    Types: Cocaine    Home Medications Prior to Admission medications   Medication Sig Start Date End Date Taking? Authorizing Provider  atorvastatin (LIPITOR) 10 MG tablet Take 10 mg by mouth daily. 09/30/20   [provider]  blood glucose meter kit and supplies KIT Dispense based on patient and insurance preference. Use up to four times daily as directed. (FOR ICD-9 250.00, 250.01). 10/23/20   Barb Merino, MD  brimonidine (ALPHAGAN) 0.2 % ophthalmic solution Place 1 drop into both eyes 2 (two) times daily.     [provider]  cyclobenzaprine (FLEXERIL) 10 MG tablet Take 10 mg by mouth 3 (three) times  daily.    [provider]  dapagliflozin propanediol (FARXIGA) 10 MG TABS tablet Take 1 tablet (10 mg total) by mouth daily. 10/23/20   Barb Merino, MD  digoxin (LANOXIN) 0.125 MG tablet Take 1 tablet (0.125 mg total) by mouth daily. 10/23/20 11/22/20  Barb Merino, MD  furosemide (LASIX) 40 MG tablet Take 1 tablet (40 mg total) by mouth daily. 10/23/20   Barb Merino, MD  insulin detemir (LEVEMIR) 100 UNIT/ML FlexPen Inject 25 Units into the skin daily. 10/23/20   Barb Merino, MD  Insulin Pen Needle (PEN NEEDLES 29GX1/2") 29G X 12MM MISC 1 each by Does not apply route daily. 10/23/20   Barb Merino, MD  latanoprost (XALATAN) 0.005 % ophthalmic solution Place 1 drop into both eyes at bedtime.     [provider]  levothyroxine (SYNTHROID) 100 MCG tablet Take 100 mcg by mouth daily before breakfast.    [provider]  melatonin 3 MG TABS tablet Take 3 mg by mouth at bedtime. 09/08/20   [provider]  metoprolol succinate (TOPROL-XL) 50 MG 24 hr tablet Take 1 tablet (50 mg total) by mouth at bedtime. Take with or immediately following a meal. 10/23/20 11/22/20  Barb Merino, MD  Paliperidone Palmitate (INVEGA SUSTENNA IM) Inject 1 pen into the muscle every 30 (thirty) days.    [provider]  polyethylene glycol (MIRALAX / GLYCOLAX) 17 g packet Take 17 g by mouth 2 (two) times daily. 10/23/20   Barb Merino, MD  risperiDONE (RISPERDAL) 0.5 MG tablet Take 0.5 mg by mouth at bedtime.    [provider]  sacubitril-valsartan (ENTRESTO) 24-26 MG Take 1 tablet by mouth 2 (two) times daily.    [provider]  senna (SENOKOT) 8.6 MG TABS tablet Take 2 tablets (17.2 mg total) by mouth 2 (two) times daily. 10/23/20   Barb Merino, MD  spironolactone (ALDACTONE) 25 MG tablet Take 1 tablet (25 mg total) by mouth daily. 10/23/20 11/22/20  Barb Merino, MD  tamsulosin (FLOMAX) 0.4 MG CAPS capsule Take 0.4 mg by mouth daily. 09/08/20    [provider]    Allergies    Ibuprofen and Penicillins  Review of Systems   Review of Systems  Cardiovascular: Positive for chest pain.  All other systems reviewed and are negative.   Physical Exam Updated Vital Signs BP 96/60   Pulse (!) 120   Temp 97.9 F (36.6 C)   Resp 17   SpO2 99%   Physical Exam Vitals and nursing note reviewed.  Constitutional:      Appearance: He is well-developed and well-nourished.  HENT:     Head: Normocephalic and atraumatic.  Cardiovascular:     Rate and Rhythm: Regular rhythm. Tachycardia present.     Heart sounds: No murmur heard.   Pulmonary:  Effort: Pulmonary effort is normal. No respiratory distress.     Breath sounds: Normal breath sounds.  Abdominal:     Palpations: Abdomen is soft.     Tenderness: There is no abdominal tenderness. There is no guarding or rebound.  Musculoskeletal:        General: No tenderness or edema.     Comments: Trace edema to bilateral lower extremities  Skin:    General: Skin is warm and dry.  Neurological:     Mental Status: He is alert and oriented to person, place, and time.  Psychiatric:        Mood and Affect: Mood and affect normal.        Behavior: Behavior normal.     ED Results / Procedures / Treatments   Labs (all labs ordered are listed, but only abnormal results are displayed) Labs Reviewed  BASIC METABOLIC PANEL - Abnormal; Notable for the following components:      Result Value   Glucose, Bld 107 (*)    All other components within normal limits  CBC - Abnormal; Notable for the following components:   WBC 11.9 (*)    MCV 78.4 (*)    MCH 25.3 (*)    RDW 18.4 (*)    All other components within normal limits  TROPONIN I (HIGH SENSITIVITY)  TROPONIN I (HIGH SENSITIVITY)    EKG None  Radiology DG Chest Port 1 View  Result Date: 11/29/2020 CLINICAL DATA:  55 year old male with chest pain and cough. EXAM: PORTABLE CHEST 1 VIEW COMPARISON:  Chest radiograph  dated 12/09/2020. FINDINGS: Slight interval improvement in aeration of the lungs compared to prior radiograph. Faint reticulonodular densities involving the mid to lower lung fields, left greater right may represent atypical infiltrate or sequela of prior pneumonia. No focal consolidation, pleural effusion, pneumothorax. Stable cardiac silhouette. No acute osseous pathology. IMPRESSION: Slight interval improvement in aeration of the lungs compared to prior radiograph. Electronically Signed   By: Anner Crete M.D.   On: 11/29/2020 18:31    Procedures Procedures (including critical care time)  Medications Ordered in ED Medications - No data to display  ED Course  I have reviewed the triage vital signs and the nursing notes.  Pertinent labs & imaging results that were available during my care of the patient were reviewed by me and considered in my medical decision making (see chart for details).    MDM Rules/Calculators/A&P                         Pt with hx/o CHF currently being worked up for mediastinal lymphadenopathy as an outpatient here for evaluation of chest pain and cough.  He tested positive for covid 19 on 1/5 but was asymptomatic at that time.  He is mildly tachycardic with no respiratory distress.  Troponin is negative.  Given his recent hospitalization as well as covid 19 infection concern for PE and CTA was obtained.  Pt care transferred pending CTA.    Final Clinical Impression(s) / ED Diagnoses Final diagnoses:  [id number]    Rx / DC Orders ED Discharge Orders    None       Quintella Reichert, MD 11/30/20 0040

## 2020-11-29 NOTE — ED Notes (Signed)
ED Provider at bedside. 

## 2020-11-30 ENCOUNTER — Telehealth: Payer: Self-pay | Admitting: Surgery

## 2020-11-30 NOTE — ED Notes (Signed)
Report called to [facility].

## 2020-11-30 NOTE — ED Notes (Signed)
[facility] CALLED  °

## 2020-11-30 NOTE — ED Notes (Signed)
Unit secretary to contact [facility] for transport.

## 2020-11-30 NOTE — Telephone Encounter (Signed)
ED CM received message from ED Secretary concerning patient needing clarification on discharge record. CM reviewed record and attempted to contact patient by phone, the person who answered informed me that patient is in a facility.  CM informed her that if the facility has any question concerning patient's discharge information to contact the ED, verbalized understanding, No further ED TOC needs identified.Marland Kitchen

## 2020-12-12 ENCOUNTER — Emergency Department (HOSPITAL_COMMUNITY)
Admission: EM | Admit: 2020-12-12 | Discharge: 2020-12-14 | Disposition: A | Discharge status: Home / Self Care | Payer: Medicaid Other | Attending: Emergency Medicine | Admitting: Emergency Medicine

## 2020-12-12 ENCOUNTER — Other Ambulatory Visit: Payer: Self-pay

## 2020-12-12 ENCOUNTER — Ambulatory Visit (HOSPITAL_COMMUNITY)
Admission: EM | Admit: 2020-12-12 | Discharge: 2020-12-12 | Disposition: A | Discharge status: Other Acute Inpatient Hospital | Payer: Medicaid Other

## 2020-12-12 DIAGNOSIS — F4323 Adjustment disorder with mixed anxiety and depressed mood: Secondary | ICD-10-CM | POA: Diagnosis not present

## 2020-12-12 DIAGNOSIS — I110 Hypertensive heart disease with heart failure: Secondary | ICD-10-CM | POA: Insufficient documentation

## 2020-12-12 DIAGNOSIS — Z794 Long term (current) use of insulin: Secondary | ICD-10-CM | POA: Insufficient documentation

## 2020-12-12 DIAGNOSIS — R45851 Suicidal ideations: Secondary | ICD-10-CM | POA: Diagnosis not present

## 2020-12-12 DIAGNOSIS — R059 Cough, unspecified: Secondary | ICD-10-CM | POA: Insufficient documentation

## 2020-12-12 DIAGNOSIS — Z79899 Other long term (current) drug therapy: Secondary | ICD-10-CM | POA: Diagnosis not present

## 2020-12-12 DIAGNOSIS — F17210 Nicotine dependence, cigarettes, uncomplicated: Secondary | ICD-10-CM | POA: Diagnosis not present

## 2020-12-12 DIAGNOSIS — E119 Type 2 diabetes mellitus without complications: Secondary | ICD-10-CM | POA: Insufficient documentation

## 2020-12-12 DIAGNOSIS — Z8616 Personal history of COVID-19: Secondary | ICD-10-CM | POA: Diagnosis not present

## 2020-12-12 DIAGNOSIS — F332 Major depressive disorder, recurrent severe without psychotic features: Secondary | ICD-10-CM | POA: Diagnosis not present

## 2020-12-12 DIAGNOSIS — I5021 Acute systolic (congestive) heart failure: Secondary | ICD-10-CM | POA: Insufficient documentation

## 2020-12-12 DIAGNOSIS — Z7984 Long term (current) use of oral hypoglycemic drugs: Secondary | ICD-10-CM | POA: Insufficient documentation

## 2020-12-12 DIAGNOSIS — R079 Chest pain, unspecified: Secondary | ICD-10-CM | POA: Diagnosis not present

## 2020-12-12 LAB — CBC
HCT: 49 % (ref 39.0–52.0)
Hemoglobin: 16.3 g/dL (ref 13.0–17.0)
MCH: 26 pg (ref 26.0–34.0)
MCHC: 33.3 g/dL (ref 30.0–36.0)
MCV: 78.1 fL — ABNORMAL LOW (ref 80.0–100.0)
Platelets: 240 10*3/uL (ref 150–400)
RBC: 6.27 MIL/uL — ABNORMAL HIGH (ref 4.22–5.81)
RDW: 18.6 % — ABNORMAL HIGH (ref 11.5–15.5)
WBC: 11.7 10*3/uL — ABNORMAL HIGH (ref 4.0–10.5)
nRBC: 0 % (ref 0.0–0.2)

## 2020-12-12 LAB — COMPREHENSIVE METABOLIC PANEL
ALT: 19 U/L (ref 0–44)
AST: 16 U/L (ref 15–41)
Albumin: 3.8 g/dL (ref 3.5–5.0)
Alkaline Phosphatase: 97 U/L (ref 38–126)
Anion gap: 12 (ref 5–15)
BUN: 14 mg/dL (ref 6–20)
CO2: 23 mmol/L (ref 22–32)
Calcium: 9.6 mg/dL (ref 8.9–10.3)
Chloride: 103 mmol/L (ref 98–111)
Creatinine, Ser: 1.36 mg/dL — ABNORMAL HIGH (ref 0.61–1.24)
GFR, Estimated: >60 mL/min (ref >60)
Glucose, Bld: 122 mg/dL — ABNORMAL HIGH (ref 70–99)
Potassium: 4.5 mmol/L (ref 3.5–5.1)
Sodium: 138 mmol/L (ref 135–145)
Total Bilirubin: 0.8 mg/dL (ref 0.3–1.2)
Total Protein: 7.2 g/dL (ref 6.5–8.1)

## 2020-12-12 LAB — ETHANOL: Alcohol, Ethyl (B): <10 mg/dL (ref <10)

## 2020-12-12 LAB — RAPID URINE DRUG SCREEN, HOSP PERFORMED
Amphetamines: NOT DETECTED
Barbiturates: NOT DETECTED
Benzodiazepines: NOT DETECTED
Cocaine: NOT DETECTED
Opiates: NOT DETECTED
Tetrahydrocannabinol: NOT DETECTED

## 2020-12-12 LAB — SALICYLATE LEVEL: Salicylate Lvl: <7 mg/dL — ABNORMAL LOW (ref 7.0–30.0)

## 2020-12-12 LAB — ACETAMINOPHEN LEVEL: Acetaminophen (Tylenol), Serum: <10 ug/mL — ABNORMAL LOW (ref 10–30)

## 2020-12-12 NOTE — ED Provider Notes (Signed)
Behavioral Health Urgent Care Medical Screening Exam  Patient Name: Charles [name] MRN: [id number] Date of Evaluation: 12/12/20 Chief Complaint:  Suicidal ideations Diagnosis:  Final diagnoses:  None    History of Present illness: Charles [name] is a 55 y.o. male who presents to [facility] via [facility] after patient allegedly called 911 expressing suicidal ideations. Patient presented to [facility] with walker and requiring transfer assistance. On further assessment patient states "I just give up. I don't want to live no more".  Based on patient's current physical condition and requirements patient transferred via [facility] to [facility] ED for further observation and stabilization.   Psychiatric Specialty Exam  Presentation  General Appearance:Disheveled (foul smelling)  Eye Contact:Minimal  Speech:Blocked; Slow  Speech Volume:Decreased  Handedness:Right   Mood and Affect  Mood:Dysphoric; Hopeless  Affect:Blunt; Depressed; Flat   Thought Process  Thought Processes:Disorganized  Descriptions of Associations:Circumstantial  Orientation:Partial  Thought Content:Illogical  Hallucinations:Other (comment) (unable to determine)  Ideas of Reference:No data recorded Suicidal Thoughts:No data recorded Homicidal Thoughts:No data recorded  Sensorium  Memory:No data recorded Judgment:No data recorded Insight:No data recorded  Executive Functions  Concentration:No data recorded Attention Span:No data recorded Recall:No data recorded [employer] recorded Language:No data recorded  Psychomotor Activity  Psychomotor Activity:No data recorded  Assets  Assets:No data recorded  Sleep  Sleep:No data recorded Number of hours: No data recorded  Physical Exam: Physical Exam Psychiatric:        Mood and Affect: Affect is blunt and flat.        Speech: Speech is delayed.        Behavior: Behavior is withdrawn.        Thought Content: Thought content is paranoid. Thought content  includes suicidal ideation.        Judgment: Judgment is impulsive and inappropriate.    Review of Systems  Psychiatric/Behavioral: Positive for depression and suicidal ideas.   There were no vitals taken for this visit. There is no height or weight on file to calculate BMI.  Musculoskeletal: Strength & Muscle Tone: decreased and patient ambulating with walker Gait & Station: unsteady, broad based Patient leans: [place] MSE Discharge Disposition for Follow up and Recommendations: Based on my evaluation the patient appears to have an emergency medical condition for which I recommend the patient be transferred to the emergency department for further evaluation.    [name], NP 12/12/2020, 6:30 PM

## 2020-12-12 NOTE — ED Triage Notes (Signed)
Pt presents to ED POV. Pt c/o SI/HI. Pt reports SI d/t medical issues. Per [place] pt has to come to [facility] d/t walker.

## 2020-12-13 ENCOUNTER — Emergency Department (HOSPITAL_COMMUNITY): Payer: Medicaid Other

## 2020-12-13 DIAGNOSIS — F332 Major depressive disorder, recurrent severe without psychotic features: Secondary | ICD-10-CM | POA: Insufficient documentation

## 2020-12-13 MED ORDER — METOPROLOL SUCCINATE ER 25 MG PO TB24
50.0000 mg | ORAL_TABLET | Freq: Every day | ORAL | Status: DC
  Administered 2020-12-13: 50 mg via ORAL
  Filled 2020-12-13: qty 2

## 2020-12-13 MED ORDER — LEVOTHYROXINE SODIUM 100 MCG PO TABS
100.0000 ug | ORAL_TABLET | Freq: Every day | ORAL | Status: DC
  Administered 2020-12-14: 100 ug via ORAL
  Filled 2020-12-13: qty 1

## 2020-12-13 MED ORDER — DIGOXIN 125 MCG PO TABS
0.1250 mg | ORAL_TABLET | Freq: Every day | ORAL | Status: DC
  Administered 2020-12-13 – 2020-12-14 (×2): 0.125 mg via ORAL
  Filled 2020-12-13 (×2): qty 1

## 2020-12-13 MED ORDER — SPIRONOLACTONE 25 MG PO TABS
25.0000 mg | ORAL_TABLET | Freq: Every day | ORAL | Status: DC
  Administered 2020-12-13 – 2020-12-14 (×2): 25 mg via ORAL
  Filled 2020-12-13 (×2): qty 1

## 2020-12-13 MED ORDER — RISPERIDONE 1 MG PO TABS
1.0000 mg | ORAL_TABLET | Freq: Two times a day (BID) | ORAL | Status: DC
  Administered 2020-12-13 – 2020-12-14 (×2): 1 mg via ORAL
  Filled 2020-12-13 (×4): qty 1

## 2020-12-13 MED ORDER — DAPAGLIFLOZIN PROPANEDIOL 10 MG PO TABS
10.0000 mg | ORAL_TABLET | Freq: Every day | ORAL | Status: DC
  Administered 2020-12-13 – 2020-12-14 (×2): 10 mg via ORAL
  Filled 2020-12-13 (×2): qty 1

## 2020-12-13 MED ORDER — FUROSEMIDE 20 MG PO TABS
40.0000 mg | ORAL_TABLET | Freq: Every day | ORAL | Status: DC
  Administered 2020-12-13 – 2020-12-14 (×2): 40 mg via ORAL
  Filled 2020-12-13 (×2): qty 2

## 2020-12-13 MED ORDER — ATORVASTATIN CALCIUM 10 MG PO TABS
10.0000 mg | ORAL_TABLET | Freq: Every day | ORAL | Status: DC
  Administered 2020-12-13 – 2020-12-14 (×2): 10 mg via ORAL
  Filled 2020-12-13 (×2): qty 1

## 2020-12-13 MED ORDER — INSULIN DETEMIR 100 UNIT/ML ~~LOC~~ SOLN
25.0000 [IU] | Freq: Every day | SUBCUTANEOUS | Status: DC
  Administered 2020-12-13: 25 [IU] via SUBCUTANEOUS
  Filled 2020-12-13 (×2): qty 0.25

## 2020-12-13 NOTE — Care Management (Signed)
Per Aurea Graff -  patient meets criteria for inpatient hospitalization.  Per [facility], Kim there are no appropriate beds at [facility].    Per Theodoro Clock, the patient will be referred to other facilities:   [facility] Details Fax  [address], [place] [address]  Internal comment [facility] Details Fax  [address] [place] [address]  Internal comment [facility] Details Fax  [address], [place] [address]  Internal comment [facility] Details Fax  [address], [place] [address]  Internal comment [facility] Details Fax  [address] [place] [address]  Internal comment [facility] Details Fax  [address] [place], [place] [address]  Internal comment [place] Details Fax  [address] [place] [address]  Internal comment [place] Details Fax  [address], [place] [address]  Internal comment [facility] Details Fax  [place], [place] [address]  Internal comment [facility] Details [facility] [address] [place] [address]  Internal comment [facility] Details Fax  [address] [place] [address]  Internal comment [facility] Details Fax  [place], [place] [address]  Internal comment [facility] Details Fax  [place], [place] [address]  Internal comment [facility] Details Fax  [phone] [address] [place] [address]  Internal comment [facility] Details Fax  [address], [place] [address]  Internal comment [facility] Details Fax  [address] [place], [place] [address]  Internal comment [facility] Details Fax  [place] [address],  [place] [address]  Internal comment [facility] Details Fax  [address], [place] [address]  Internal comment [facility] Details Fax  [address] [place] [address]  Internal comment [facility] Details Fax  [phone]. [address], [place] [address]  Internal comment [facility] Details Fax  [address] [place] [address]  Internal comment [place] Details Fax  [address] [place] [address]  Internal comment [facility] Details Fax  [address] [place] [address]  Internal comment [facility] Details Fax  [place], [place] [address]  Internal comment [facility] Details Fax  [address] [place] [address]  Internal comment [place] Details Fax  [address] Diam[place]ina Monks [place] [address]  Internal comment [facility] Details Fax  [address] [place] [address]  Internal comment [facility] Details Fax  [address] [place] [address]  Internal comment [facility] Details Fax  [address], [place] [address]  Internal comment [facility] Details Fax  [address], [place] [address]  Internal comment [facility] Details Fax  [address], [place] [address]  Internal comment [facility] Details Fax  [address], [place]io [place] [address]  Internal comment [facility] Details Fax  [address], [place] [address]  Internal comment [facility] Details Fax  [address] [place] [address]  Internal comment [place] Details Fax  [address] [place] [address]  Internal comment [place] Details Fax  [place], [place] [address]  Internal comment [place] Details Fax  [place] [place] [address]  Internal comment [place] Details Fax  [address] [place] [address]

## 2020-12-13 NOTE — ED Provider Notes (Signed)
[facility] Provider Note   CSN: [id number] Arrival date & time: 12/12/20  1748    History Chief Complaint  Patient presents with  . Suicidal    Charles [name] is a 55 y.o. male with complex cardiac PMH presents to ER for evaluation of "I don't care anymore".  When asked to elaborate he says "about anything".  Admits to gradual worsening of thought of dying for the last weeks to months.  This has worsened in the last few days.  Has been thinking about killing himself by cutting his wrists.  Has not attempted to do anything to hurt himself yet. When asked about homicidal thoughts he says "as long as I'm by myself I'm good". Does not want to be around people.  Has been hearing voices but doesn't want to elaborate of what he is hearing. Denies visual hallucinations.  Has been taking all of his medicines until presenting to the ED.  Reports chronic dry cough that he attributes to "something in my heart".  He gets occasionally sharp pain on the right lateral chest when coughing. States this is not new. Denies any new CP, SOB, leg swelling.    History of nonischemic cardiomyopathy with LVEF 20-25%, CHF, atypical CP, chronic exertional SOB, HTN, HLD, tobacco use, mediastinal lymphadenopathy awaiting biopsy February 10th. Per last cardiology note 12/08/2020 patient will need Life Vest if EF persists below 30%. He states they have to given him one yet.   HPI     Past Medical History:  Diagnosis Date  . Arthritis    lower back  . Bipolar 1 disorder ([place])   . Cardiomyopathy, nonischemic ([place])    no CAD 10/14/20 cath, EF 20-25% by 10/11/20 echo  . CHF (congestive heart failure) ([place])   . COVID 10/2020  . DM2 (diabetes mellitus, type 2) ([place])   . HTN (hypertension)   . Myocardial infarction [facility])     Patient Active Problem List   Diagnosis Date Noted  . Major depressive disorder, recurrent severe without psychotic features ([place])   . Adjustment disorder with  mixed anxiety and depressed mood 10/30/2020  . Acute systolic heart failure ([place])   . Mediastinal lymphadenopathy 10/11/2020  . Shortness of breath 10/11/2020  . Sinus tachycardia 10/11/2020  . Night sweats 10/11/2020  . DM2 (diabetes mellitus, type 2) ([place]) 10/11/2020  . Lymphoma ([place]) 10/11/2020  . Hypertension 09/25/2020  . BPH (benign prostatic hyperplasia) 09/25/2020  . Insomnia 09/25/2020    Past Surgical History:  Procedure Laterality Date  . BACK SURGERY  01/2020  . CARDIAC CATHETERIZATION  10/2020  . HERNIA REPAIR    . RIGHT/LEFT HEART CATH AND CORONARY ANGIOGRAPHY N/A 10/14/2020   Procedure: RIGHT/LEFT HEART CATH AND CORONARY ANGIOGRAPHY;  Surgeon: Jettie Booze, MD;  Location: [facility];  Service: Cardiovascular;  Laterality: N/A;  . SPINAL FIXATION SURGERY  02/26/2020       Family History  Problem Relation Age of Onset  . Hypertension Mother   . Hyperlipidemia Mother   . Stroke Mother   . Cancer Mother   . Hypertension Father   . Hyperlipidemia Father   . Healthy Sister     Social History   Tobacco Use  . Smoking status: Current Every Day Smoker    Packs/day: 0.25    Types: Cigarettes  . Smokeless tobacco: Never Used  . Tobacco comment: occasionally  Vaping Use  . Vaping Use: Never used  Substance Use Topics  . Alcohol use:  Never  . Drug use: Not Currently    Types: Cocaine    Home Medications Prior to Admission medications   Medication Sig Start Date End Date Taking? Authorizing Provider  atorvastatin (LIPITOR) 10 MG tablet Take 10 mg by mouth daily. 09/30/20   [provider]  blood glucose meter kit and supplies KIT Dispense based on patient and insurance preference. Use up to four times daily as directed. (FOR ICD-9 250.00, 250.01). 10/23/20   Barb Merino, MD  brimonidine (ALPHAGAN) 0.2 % ophthalmic solution Place 1 drop into both eyes 2 (two) times daily.     [provider]  cyclobenzaprine (FLEXERIL) 10  MG tablet Take 10 mg by mouth 3 (three) times daily.    [provider]  dapagliflozin propanediol (FARXIGA) 10 MG TABS tablet Take 1 tablet (10 mg total) by mouth daily. 10/23/20   Barb Merino, MD  digoxin (LANOXIN) 0.125 MG tablet Take 1 tablet (0.125 mg total) by mouth daily. 10/23/20 11/22/20  Barb Merino, MD  furosemide (LASIX) 40 MG tablet Take 1 tablet (40 mg total) by mouth daily. 10/23/20   Barb Merino, MD  insulin detemir (LEVEMIR) 100 UNIT/ML FlexPen Inject 25 Units into the skin daily. 10/23/20   Barb Merino, MD  Insulin Pen Needle (PEN NEEDLES 29GX1/2") 29G X 12MM MISC 1 each by Does not apply route daily. 10/23/20   Barb Merino, MD  latanoprost (XALATAN) 0.005 % ophthalmic solution Place 1 drop into both eyes at bedtime.     [provider]  levothyroxine (SYNTHROID) 100 MCG tablet Take 100 mcg by mouth daily before breakfast.    [provider]  melatonin 3 MG TABS tablet Take 3 mg by mouth at bedtime. 09/08/20   [provider]  metoprolol succinate (TOPROL-XL) 50 MG 24 hr tablet Take 1 tablet (50 mg total) by mouth at bedtime. Take with or immediately following a meal. 10/23/20 11/22/20  Barb Merino, MD  Paliperidone Palmitate (INVEGA SUSTENNA IM) Inject 1 pen into the muscle every 30 (thirty) days.    [provider]  polyethylene glycol (MIRALAX / GLYCOLAX) 17 g packet Take 17 g by mouth 2 (two) times daily. 10/23/20   Barb Merino, MD  risperiDONE (RISPERDAL) 0.5 MG tablet Take 0.5 mg by mouth at bedtime.    [provider]  sacubitril-valsartan (ENTRESTO) 24-26 MG Take 1 tablet by mouth 2 (two) times daily.    [provider]  senna (SENOKOT) 8.6 MG TABS tablet Take 2 tablets (17.2 mg total) by mouth 2 (two) times daily. 10/23/20   Barb Merino, MD  spironolactone (ALDACTONE) 25 MG tablet Take 1 tablet (25 mg total) by mouth daily. 10/23/20 11/22/20  Barb Merino, MD  tamsulosin (FLOMAX) 0.4 MG  CAPS capsule Take 0.4 mg by mouth daily. 09/08/20   [provider]    Allergies    Ibuprofen and Penicillins  Review of Systems   Review of Systems  Respiratory: Positive for cough (chronic).   Psychiatric/Behavioral: Positive for suicidal ideas.  All other systems reviewed and are negative.   Physical Exam Updated Vital Signs BP (!) 106/34   Pulse (!) 108   Temp 98.2 F (36.8 C) (Oral)   Resp 16   SpO2 96%   Physical Exam Vitals and nursing note reviewed.  Constitutional:      General: He is not in acute distress.    Appearance: He is well-developed and well-nourished.     Comments: NAD.  HENT:     Head: Normocephalic  and atraumatic.     Right Ear: External ear normal.     Left Ear: External ear normal.     Nose: Nose normal.  Eyes:     General: No scleral icterus.    Extraocular Movements: EOM normal.     Conjunctiva/sclera: Conjunctivae normal.  Cardiovascular:     Rate and Rhythm: Normal rate and regular rhythm.     Pulses: Intact distal pulses.     Heart sounds: Normal heart sounds. No murmur heard.     Comments: No LE edema. Lungs clear without rhonchi or rales. HR high 90s.  Pulmonary:     Effort: Pulmonary effort is normal.     Breath sounds: Normal breath sounds. No wheezing.  Musculoskeletal:        General: No deformity. Normal range of motion.     Cervical back: Normal range of motion and neck supple.  Skin:    General: Skin is warm and dry.     Capillary Refill: Capillary refill takes less than 2 seconds.  Neurological:     Mental Status: He is alert and oriented to person, place, and time.  Psychiatric:        Mood and Affect: Mood is depressed.        Speech: Speech normal.        Behavior: Behavior is withdrawn.        Thought Content: Thought content includes suicidal ideation. Thought content includes suicidal plan.        Judgment: Judgment is inappropriate.     ED Results / Procedures / Treatments   Labs (all labs ordered  are listed, but only abnormal results are displayed) Labs Reviewed  COMPREHENSIVE METABOLIC PANEL - Abnormal; Notable for the following components:      Result Value   Glucose, Bld 122 (*)    Creatinine, Ser 1.36 (*)    All other components within normal limits  SALICYLATE LEVEL - Abnormal; Notable for the following components:   Salicylate Lvl <2.8 (*)    All other components within normal limits  ACETAMINOPHEN LEVEL - Abnormal; Notable for the following components:   Acetaminophen (Tylenol), Serum <10 (*)    All other components within normal limits  CBC - Abnormal; Notable for the following components:   WBC 11.7 (*)    RBC 6.27 (*)    MCV 78.1 (*)    RDW 18.6 (*)    All other components within normal limits  ETHANOL  RAPID URINE DRUG SCREEN, HOSP PERFORMED    EKG None  Radiology DG Chest 1 View  Result Date: 12/13/2020 CLINICAL DATA:  Chronic cough sharp RIGHT-sided chest pain in a 55 year old male EXAM: CHEST  1 VIEW COMPARISON:  11/29/2020 FINDINGS: Trachea midline. Mild cardiac enlargement is similar to previous imaging. Patchy bilateral opacities that were seen on the prior study not as well displayed on the current exam. Lung volumes slightly diminished. No lobar consolidative process. Study limited by technique and body habitus. On limited assessment no acute skeletal process. IMPRESSION: On balance stable exam accounting for technical factors in this patient with reported history of COVID-19 infection on prior exams. No lobar consolidative changes or sign of pleural effusion. Electronically Signed   By: Zetta Bills M.D.   On: 12/13/2020 09:35    Procedures Procedures   Medications Ordered in ED Medications  atorvastatin (LIPITOR) tablet 10 mg (has no administration in time range)  dapagliflozin propanediol (FARXIGA) tablet 10 mg (has no administration in time range)  furosemide (LASIX)  tablet 40 mg (has no administration in time range)  digoxin (LANOXIN) tablet  0.125 mg (has no administration in time range)  levothyroxine (SYNTHROID) tablet 100 mcg (has no administration in time range)  metoprolol succinate (TOPROL-XL) 24 hr tablet 50 mg (has no administration in time range)  spironolactone (ALDACTONE) tablet 25 mg (has no administration in time range)  insulin detemir (LEVEMIR) injection 25 Units (has no administration in time range)    ED Course  I have reviewed the triage vital signs and the nursing notes.  Pertinent labs & imaging results that were available during my care of the patient were reviewed by me and considered in my medical decision making (see chart for details).  Clinical Course as of 12/13/20 1014  Sat Dec 13, 2020  0830 COVID+ 1/5 [CG]  0830 Asked pharmacy again to reconcile patient's meds. First call at 0730 am.  [CG]  1006 Pharmacy unable to reconcile patient's medicine. He doesn't know what he is taking and facility [facility] is not picking. I have re-ordered patient's cardiac medicines based on last cardiology note 1 week ago  [CG]    Clinical Course User Index [CG] Arlean Hopping   MDM Rules/Calculators/A&P                           55 y.o. yo with chief complaint of suicidal ideations with plan to cut his wrists.  This is in setting of several medical problems, possible cancer diagnosis, financial issues.  Patient has history of schizophrenia.  Reports 1 previous time when he was suicidal.  He denies any acute or changes in his chronic shortness of breath, dry cough.  Vital signs reveal minimal tachycardia 108 and his usual blood pressure in the low 468E systolic.  No signs of overt fluid overload.  Previous medical records available, triage and nursing notes reviewed to obtain more history and assist with MDM  Additional information obtained from recent notes from provider in chart including cardiology notes  Differential diagnosis: Schizophrenia decompensation   ER lab work and  imaging ordered by triage RN and me, as above  I have personally visualized and interpreted ER diagnostic work up including labs and imaging.    Labs reveal -WBC 11.7.  Normal hemoglobin.  Glucose 122, creatinine 1.36.  Undetectable EtOH, APAP, ASA.  UDS negative.  Imaging reveals -EKG, chest x-ray done today given his significant cardiac past medical history ever patient has not had any acute or changes in his chronic cough, shortness of breath.  No chest pain.  EKG is unchanged, no ischemia.  Chest x-ray reveals improving bilateral opacities from previous chest x-ray at the time that patient had COVID-19 1 month ago.  No edema.     Medications ordered -pharmacy was unable to reconcile patient's meds.  I have reviewed patient's visit with cardiology last week and I have ordered his cardiac medicines.  1000: Re-evaluated the patient.   He is in a [facility] bed with a Actuary.  TTS is recommending inpatient hospitalization.    Final Clinical Impression(s) / ED Diagnoses Final diagnoses:  Suicidal ideations    Rx / DC Orders ED Discharge Orders    None       Kinnie Feil, PA-C 12/13/20 1014    Sherwood Gambler, MD 12/13/20 1026

## 2020-12-13 NOTE — BH Assessment (Addendum)
Comprehensive Clinical Assessment (CCA) Note  12/13/2020 Charles [name] [id number]    Patient presents to the [facility] voluntarily stating that he does not want to live anymore.  Patient was seen in January for the same complaint, but at that time he had a plan to walk into traffic.  On this presentation, patient states that he waill kill himself "anyway I can."  Patient states that he has a history of depression, but has no current provider and states that he has been off his medications since August of last year.  Patient states that he has a prior suicide attempt in the 1990's by overdose.  Patient states that in the 1990's that he has a history of admissions to [facility] and [facility], but denies any recent hospitalizations. When asked what has him so stressed that he would like to kill himself, he identifies that he does not like the assisted living facility where he has been living for the past two months and states, "I don't want to go back there."  Patient denies HI/Psychosis and SA issues.  He states that most recently that he has been so depressed that he has not been wanting to get out of bed and states that he has not been eating well.  [name] requesting psychiatric hospitalization.  Patient denies any history of abuse or self-mutilation.  He denies having any access to weapons and denies having any current legal involvement.  Patient states that he is divorced and he has one child.  He states that he has minimal contact with his child.  He states that he had three siblings, but two are deceased and his only living brother is incarcerated.  Patient states that he has his GED, but the last grade he completed was the ninth. Patient states that he has been on disability since the 1990's.    Patient presents as alert and oriented.  His mood is depressed and his affect is flat.  His judgment, insight and impulse control are impaired.  His thoughts are organized, but his memory is mildly impaired.  He does  not appear to be responding to any internal stimuli.  Chief Complaint:  Chief Complaint  Patient presents with  . Suicidal   Visit Diagnosis: F33.2 MDD Recurrent Severe   CCA Screening, Triage and Referral (STR)  Patient Reported Information How did you hear about [place]? Self  Referral name: No data recorded Referral phone number: No data recorded  Whom do you see for routine medical problems? Other (Comment) (Cardiologist, [name], MD)  Practice/Facility Name: No data recorded Practice/Facility Phone Number: No data recorded Name of Contact: No data recorded Contact Number: No data recorded Contact Fax Number: No data recorded Prescriber Name: No data recorded Prescriber Address (if known): No data recorded  What Is the Reason for Your Visit/Call Today? Patient states that he is depressed and he is suicidal  How Long Has This Been Causing You Problems? 1 wk - 1 month  What Do You Feel Would Help You the Most Today? Other (Comment) (Patient requesting  inpatient treatment)   Have You Recently Been in Any Inpatient Treatment (Hospital/Detox/Crisis Center/28-Day Program)? No  Name/Location of Program/Hospital:No data recorded How Long Were You There? No data recorded When Were You Discharged? No data recorded  Have You Ever Received Services From [facility] Before? Yes  Who Do You See at [facility]? Patient has been seen in the ED and at the [facility]   Have You Recently Had Any Thoughts  About Hurting Yourself? Yes  Are You Planning to Commit Suicide/Harm Yourself At This time? Yes   Have you Recently Had Thoughts About Hurting Someone [place]? No  Explanation: No data recorded  Have You Used Any Alcohol or Drugs in the Past 24 Hours? No  How Long Ago Did You Use Drugs or Alcohol? No data recorded What Did You Use and How Much? No data recorded  Do You Currently Have a Therapist/Psychiatrist? No  Name of Therapist/Psychiatrist: No data  recorded  Have You Been Recently Discharged From Any Office Practice or Programs? No  Explanation of Discharge From Practice/Program: No data recorded    CCA Screening Triage Referral Assessment Type of Contact: Tele-Assessment  Is this Initial or Reassessment? Initial Assessment  Date Telepsych consult ordered in [facility]:  12/13/2020  Time Telepsych consult ordered in [facility]:  0706   Patient Reported Information Reviewed? No data recorded Patient Left Without Being Seen? No data recorded Reason for Not Completing Assessment: No data recorded  Collateral Involvement: No collateral information available at time of assessment   Does Patient Have a [place]? No data recorded Name and Contact of Legal Guardian: No data recorded If Minor and Not Living with Parent(s), Who has Custody? No data recorded Is CPS involved or ever been involved? No data recorded Is APS involved or ever been involved? Never   Patient Determined To Be At Risk for Harm To Self or Others Based on Review of Patient Reported Information or Presenting Complaint? Yes, for Self-Harm  Method: No data recorded Availability of Means: No data recorded Intent: No data recorded Notification Required: No data recorded Additional Information for Danger to Others Potential: No data recorded Additional Comments for Danger to Others Potential: No data recorded Are There Guns or Other Weapons in Your Home? No data recorded Types of Guns/Weapons: No data recorded Are These Weapons Safely Secured?                            No data recorded Who Could Verify You Are Able To Have These Secured: No data recorded Do You Have any Outstanding Charges, Pending Court Dates, Parole/Probation? No data recorded Contacted To Inform of Risk of Harm To Self or Others: Other: Comment (assisted living center where patient lives is aware of situation)   Location of Assessment: [facility]   Does Patient Present under  Involuntary Commitment? No  IVC Papers Initial File Date: No data recorded  [place] of Residence: [place]   Patient Currently Receiving the Following Services: Not Receiving Services   Determination of Need: No data recorded  Options For Referral: Inpatient Hospitalization; Medication Management; Outpatient Therapy     CCA Biopsychosocial Intake/Chief Complaint:  Patient presents to the [facility] voluntarily stating that he does not want to live anymore.  Patient was seen in January for the same complaint, but at that time he had a plan to walk into traffic.  On this presentation, patient states that he waill kill himself "anyway I can."  Patient states that he has a history of depression, but has no current provider and states that he has been off his medications since August of last year.  Patient states that he has a prior suicide attempt in the 1990's by overdose.  Patient states that in the 1990's that he has a history of admissions to [facility] and [facility], but denies any recent hospitalizations. When asked what has him so stressed that  he would like to kill himself, he identifies that he does not like the assisted living facility where he has been living for the past two months and states, "I don't want to go back there."  Patient denies HI/Psychosis and SA issues.  He states that most recently that he has been so depressed that he has not been wanting to get out of bed and states that he has not been eating well.  [name] requesting psychiatric hospitalization.  Current Symptoms/Problems: Worsening depressive symptoms   Patient Reported Schizophrenia/Schizoaffective Diagnosis in Past: No   Strengths: Patient states, "I don't know."  Preferences: [name] requesting to be discharged to a new living facility.  Patient requires a walker for ambulation. Requires a heart diet.  Abilities: Patient states that he really does not have any abilities   Type of Services Patient Feels are  Needed: Inpatient hospitalization   Initial Clinical Notes/Concerns: No data recorded  Mental Health Symptoms Depression:  Sleep (too much or little); Change in energy/activity; Fatigue; Worthlessness; Hopelessness; Irritability   Duration of Depressive symptoms: Greater than two weeks   Mania:  None   Anxiety:   Fatigue; Restlessness; Sleep; Tension; Worrying   Psychosis:  None   Duration of Psychotic symptoms: No data recorded  Trauma:  None   Obsessions:  None   Compulsions:  None   Inattention:  None   Hyperactivity/Impulsivity:  N/A   Oppositional/Defiant Behaviors:  None   Emotional Irregularity:  None   Other Mood/Personality Symptoms:  No data recorded   Mental Status Exam Appearance and self-care  Stature:  Average   Weight:  Average weight   Clothing:  Age-appropriate   Grooming:  Normal   Cosmetic use:  None   Posture/gait:  Normal; Other (Comment) (patient requires a walker)   Motor activity:  Not Remarkable   Sensorium  Attention:  Normal   Concentration:  Normal   Orientation:  Person; Object; Place; Situation; Time   Recall/memory:  Normal   Affect and Mood  Affect:  Anxious; Appropriate; Depressed   Mood:  Anxious; Depressed; Worthless; Hopeless   Relating  Eye contact:  Normal   Facial expression:  Depressed   Attitude toward examiner:  Cooperative   Thought and Language  Speech flow: Normal   Thought content:  Appropriate to Mood and Circumstances   Preoccupation:  None   Hallucinations:  None   Organization:  No data recorded  [employer] of Knowledge:  Average   Intelligence:  Average   Abstraction:  Functional   Judgement:  Poor   Reality Testing:  Realistic   Insight:  Fair   Decision Making:  Impulsive   Social Functioning  Social Maturity:  Isolates   Social Judgement:  Normal   Stress  Stressors:  Transitions; Housing; Other (Comment)   Coping Ability:  Exhausted;  Overwhelmed   Skill Deficits:  Self-control; Responsibility; Decision making   Supports:  Support needed     Religion: Religion/Spirituality Are You A Religious Person?:  (not assessed)  Leisure/Recreation: Leisure / Recreation Do You Have Hobbies?: No  Exercise/Diet: Exercise/Diet Do You Exercise?: No Have You Gained or Lost A Significant Amount of Weight in the Past Six Months?: Yes-Lost Number of Pounds Lost?:  (amt of weight lost is unknown) Do You Follow a Special Diet?: Yes Type of Diet: special diet due to heart condition Do You Have Any Trouble Sleeping?: Yes Explanation of Sleeping Difficulties: states that he sleeps excessively   CCA Employment/Education Employment/Work  Situation: Employment / Work Situation Employment situation: On disability Why is patient on disability: mental health and physical disability How long has patient been on disability: since the 1990's Patient's job has been impacted by current illness: No What is the longest time patient has a held a job?: unknown Where was the patient employed at that time?: unknown Has patient ever been in the [employer]?: No  Education: Education Is Patient Currently Attending School?: No Last Grade Completed: 9 Name of High School: unknown high school in [place] Did Teacher, adult education From [employer]?: No (patient did get his GED) Did Physicist, medical?: No Did You Attend Graduate School?: No Did You Have Any Special Interests In School?: none reported Did You Have An Individualized Education Program (IIEP): No Did You Have Any Difficulty At School?: No Patient's Education Has Been Impacted by Current Illness: No   CCA Family/Childhood History Family and Relationship History: Family history Marital status: Divorced Divorced, when?: not assessed What types of issues is patient dealing with in the relationship?: patient is not currently in a relationship Are you sexually active?: No What is your  sexual orientation?: heterosexual Has your sexual activity been affected by drugs, alcohol, medication, or emotional stress?: N/A Does patient have children?: Yes How many children?: 1 How is patient's relationship with their children?: Patient has very little contact with his child  Childhood History:  Childhood History By whom was/is the patient raised?: Grandparents Additional childhood history information: Patient states that his mother had mental health issues Description of patient's relationship with caregiver when they were a child: Patient states that he was raised by his grandparents in what he describes as a good home Patient's description of current relationship with people who raised him/her: Patient states that his parents and his grandparents are deceased How were you disciplined when you got in trouble as a child/adolescent?: not assessed Does patient have siblings?: Yes Number of Siblings: 3 Description of patient's current relationship with siblings: 2 deceased and one in prison Did patient suffer any verbal/emotional/physical/sexual abuse as a child?: No Did patient suffer from severe childhood neglect?: No Has patient ever been sexually abused/assaulted/raped as an adolescent or adult?: No Was the patient ever a victim of a crime or a disaster?: No Witnessed domestic violence?: No Has patient been affected by domestic violence as an adult?: No  Child/Adolescent Assessment:     CCA Substance Use Alcohol/Drug Use:      ASAM's:  Six Dimensions of Multidimensional Assessment  Dimension 1:  Acute Intoxication and/or Withdrawal Potential:      Dimension 2:  Biomedical Conditions and Complications:      Dimension 3:  Emotional, Behavioral, or Cognitive Conditions and Complications:     Dimension 4:  Readiness to Change:     Dimension 5:  Relapse, Continued use, or Continued Problem Potential:     Dimension 6:  Recovery/Living Environment:     ASAM Severity Score:     ASAM Recommended Level of Treatment:     Substance use Disorder (SUD)    Recommendations for Services/Supports/Treatments:    DSM5 Diagnoses: Patient Active Problem List   Diagnosis Date Noted  . Major depressive disorder, recurrent severe without psychotic features ([place])   . Adjustment disorder with mixed anxiety and depressed mood 10/30/2020  . Acute systolic heart failure ([place])   . Mediastinal lymphadenopathy 10/11/2020  . Shortness of breath 10/11/2020  . Sinus tachycardia 10/11/2020  . Night sweats 10/11/2020  . DM2 (diabetes mellitus, type 2) ([place]) 10/11/2020  .  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 Hypertension 09/25/2020  . BPH (benign prostatic hyperplasia) 09/25/2020  . Insomnia 09/25/2020    Disposition:  Per Waylan Boga, DNP, inpatient treatment is recommended   Referrals to Alternative Service(s): Referred to Alternative Service(s):   Place:   Date:   Time:    Referred to Alternative Service(s):   Place:   Date:   Time:    Referred to Alternative Service(s):   Place:   Date:   Time:    Referred to Alternative Service(s):   Place:   Date:   Time:     Joeanna Howdyshell J Granger Chui, LCAS

## 2020-12-13 NOTE — ED Notes (Signed)
Pt resting comfortably in bed, denies pain at this time Respirations are even and non-labored Skin is warm,dry and intact  Sitter remain at bedside

## 2020-12-14 DIAGNOSIS — F4323 Adjustment disorder with mixed anxiety and depressed mood: Secondary | ICD-10-CM

## 2020-12-14 LAB — CBG MONITORING, ED: Glucose-Capillary: 111 mg/dL — ABNORMAL HIGH (ref 70–99)

## 2020-12-14 MED ORDER — ADULT MULTIVITAMIN W/MINERALS CH
1.0000 | ORAL_TABLET | Freq: Every day | ORAL | Status: DC
  Administered 2020-12-14: 1 via ORAL
  Filled 2020-12-14: qty 1

## 2020-12-14 MED ORDER — RISPERIDONE 0.5 MG PO TABS: 0.5000 mg | ORAL_TABLET | Freq: Every day | ORAL | Status: DC

## 2020-12-14 MED ORDER — GEMFIBROZIL 600 MG PO TABS
600.0000 mg | ORAL_TABLET | Freq: Two times a day (BID) | ORAL | Status: DC
  Filled 2020-12-14 (×2): qty 1

## 2020-12-14 MED ORDER — CYCLOBENZAPRINE HCL 10 MG PO TABS: 5.0000 mg | ORAL_TABLET | Freq: Three times a day (TID) | ORAL | Status: DC | PRN

## 2020-12-14 MED ORDER — DIVALPROEX SODIUM 250 MG PO DR TAB
500.0000 mg | DELAYED_RELEASE_TABLET | Freq: Two times a day (BID) | ORAL | Status: DC
  Administered 2020-12-14: 500 mg via ORAL
  Filled 2020-12-14: qty 2

## 2020-12-14 MED ORDER — RISPERIDONE 0.5 MG PO TABS: 0.5000 mg | ORAL_TABLET | Freq: Every day | ORAL | 0 refills | Status: AC

## 2020-12-14 MED ORDER — SERTRALINE HCL 25 MG PO TABS: 25.0000 mg | ORAL_TABLET | Freq: Every day | ORAL | 0 refills | Status: DC

## 2020-12-14 MED ORDER — SERTRALINE HCL 25 MG PO TABS
25.0000 mg | ORAL_TABLET | Freq: Every day | ORAL | Status: DC
  Administered 2020-12-14: 25 mg via ORAL
  Filled 2020-12-14: qty 1

## 2020-12-14 MED ORDER — DIVALPROEX SODIUM 500 MG PO DR TAB: 500.0000 mg | DELAYED_RELEASE_TABLET | Freq: Two times a day (BID) | ORAL | 0 refills | Status: DC

## 2020-12-14 MED ORDER — TAMSULOSIN HCL 0.4 MG PO CAPS: 0.4000 mg | ORAL_CAPSULE | Freq: Every day | ORAL | Status: DC

## 2020-12-14 NOTE — Progress Notes (Addendum)
CSW contacted by RN as group home refusing to pick up patient. CSW attempted to reach [facility] via phone to clarify situation. Group home staff is not answering calls. CSW contacted non-emergency for a safety check.   4:30p: CSW received return call from [facility] and was informed by the officer there were no staff present. [employer] reports a resident reported the staff member Hazel [phone] had left earlier today as she found out her boyfriend was cheating on her. The resident reported the facility chef was on-site. [employer] searched the facility and did not locate the chef or any staff at all. [employer] notes the residents were unaware of the boyfriend's phone number or location. [employer] received a similar call sent to voicemail after two rings and then immediately going to voicemail.  CSW reviewed with [facility] on-call supervisor the scenario and will follow-up with [employer]. CSW informed RN.

## 2020-12-14 NOTE — Consult Note (Signed)
[facility] Psych ED Discharge  12/14/2020 1:10 PM Charles [name]  MRN:  [id number] Principal Problem: Adjustment disorder with mixed anxiety and depressed mood Discharge Diagnoses: Principal Problem:   Adjustment disorder with mixed anxiety and depressed mood   Subjective:  Patient seen by this provider in the ED. Pt reports he is "sleeping too much" and "negative thoughts are still there." He reports he has been "off his medication" since August 2021 and if he is able to restart his medications "I will be good to go."  He denies suicidal and homicidal ideation, AVH, paranoia, elevated mood. Upset with his assisted living yesterday as he feels he has "no money".  Encouraged him to seek other placement after discharge to better meet his needs and wants.  More pleasant today with no threats to end his life.  Psychiatrically cleared for discharge.  Total Time spent with patient: 45 minutes  Past Psychiatric History: See below.   Past Medical History:  Past Medical History:  Diagnosis Date  . Arthritis    lower back  . Bipolar 1 disorder ([place])   . Cardiomyopathy, nonischemic ([place])    no CAD 10/14/20 cath, EF 20-25% by 10/11/20 echo  . CHF (congestive heart failure) ([place])   . COVID 10/2020  . DM2 (diabetes mellitus, type 2) ([place])   . HTN (hypertension)   . Myocardial infarction [facility])     Past Surgical History:  Procedure Laterality Date  . BACK SURGERY  01/2020  . CARDIAC CATHETERIZATION  10/2020  . HERNIA REPAIR    . RIGHT/LEFT HEART CATH AND CORONARY ANGIOGRAPHY N/A 10/14/2020   Procedure: RIGHT/LEFT HEART CATH AND CORONARY ANGIOGRAPHY;  Surgeon: Jettie Booze, MD;  Location: [facility];  Service: Cardiovascular;  Laterality: N/A;  . SPINAL FIXATION SURGERY  02/26/2020   Family History:  Family History  Problem Relation Age of Onset  . Hypertension Mother   . Hyperlipidemia Mother   . Stroke Mother   . Cancer Mother   . Hypertension Father   . Hyperlipidemia Father   .  Healthy Sister    Family Psychiatric  History: See above Social History:  Social History   Substance and Sexual Activity  Alcohol Use Never     Social History   Substance and Sexual Activity  Drug Use Not Currently  . Types: Cocaine    Social History   Socioeconomic History  . Marital status: Divorced    Spouse name: Not on file  . Number of children: 1  . Years of education: Not on file  . Highest education level: Not on file  Occupational History  . Not on file  Tobacco Use  . Smoking status: Current Every Day Smoker    Packs/day: 0.25    Types: Cigarettes  . Smokeless tobacco: Never Used  . Tobacco comment: occasionally  Vaping Use  . Vaping Use: Never used  Substance and Sexual Activity  . Alcohol use: Never  . Drug use: Not Currently    Types: Cocaine  . Sexual activity: Not Currently  Other Topics Concern  . Not on file  Social History Narrative  . Not on file   Social Determinants of Health   Financial Resource Strain: Not on file  Food Insecurity: Not on file  Transportation Needs: Not on file  Physical Activity: Not on file  Stress: Not on file  Social Connections: Not on file    Has this patient used any form of tobacco in the last 30 days? (Cigarettes, Smokeless  Tobacco, Cigars, and/or Pipes) N/A  Current Medications: Current Facility-Administered Medications  Medication Dose Route Frequency Provider Last Rate Last Admin  . cyclobenzaprine (FLEXERIL) tablet 5 mg  5 mg Oral TID PRN Patrecia Pour, NP      . dapagliflozin propanediol (FARXIGA) tablet 10 mg  10 mg Oral Daily Kinnie Feil, PA-C   10 mg at 12/14/20 1115  . digoxin (LANOXIN) tablet 0.125 mg  0.125 mg Oral Daily Kinnie Feil, PA-C   0.125 mg at 12/14/20 1115  . divalproex (DEPAKOTE) DR tablet 500 mg  500 mg Oral Q12H Kaylla Cobos, Asa Saunas, NP      . furosemide (LASIX) tablet 40 mg  40 mg Oral Daily Kinnie Feil, PA-C   40 mg at 12/14/20 1113  . gemfibrozil (LOPID) tablet  600 mg  600 mg Oral BID AC Lissandro Dilorenzo, Asa Saunas, NP      . insulin detemir (LEVEMIR) injection 25 Units  25 Units Subcutaneous QHS Kinnie Feil, [place]   25 Units at 12/13/20 2207  . levothyroxine (SYNTHROID) tablet 100 mcg  100 mcg Oral QAC breakfast Kinnie Feil, PA-C   100 mcg at 12/14/20 0841  . metoprolol succinate (TOPROL-XL) 24 hr tablet 50 mg  50 mg Oral QHS Kinnie Feil, PA-C   50 mg at 12/13/20 2157  . multivitamin with minerals tablet 1 tablet  1 tablet Oral Daily Patrecia Pour, NP      . Derrill Memo ON 12/15/2020] risperiDONE (RISPERDAL) tablet 0.5 mg  0.5 mg Oral QHS Patrecia Pour, NP      . sertraline (ZOLOFT) tablet 25 mg  25 mg Oral Daily Patrecia Pour, NP      . spironolactone (ALDACTONE) tablet 25 mg  25 mg Oral Daily Kinnie Feil, PA-C   25 mg at 12/14/20 1114  . [START ON 12/15/2020] tamsulosin (FLOMAX) capsule 0.4 mg  0.4 mg Oral QPC breakfast Patrecia Pour, NP       Current Outpatient Medications  Medication Sig Dispense Refill  . acetaminophen (TYLENOL) 325 MG tablet Take 650 mg by mouth every 6 (six) hours as needed for moderate pain.    Marland Kitchen atorvastatin (LIPITOR) 10 MG tablet Take 10 mg by mouth daily.    . brimonidine (ALPHAGAN) 0.2 % ophthalmic solution Place 1 drop into both eyes 2 (two) times daily.     . cyclobenzaprine (FLEXERIL) 10 MG tablet Take 10 mg by mouth 3 (three) times daily.    . dapagliflozin propanediol (FARXIGA) 10 MG TABS tablet Take 1 tablet (10 mg total) by mouth daily. 30 tablet 3  . digoxin (LANOXIN) 0.125 MG tablet Take 1 tablet (0.125 mg total) by mouth daily. 30 tablet 0  . furosemide (LASIX) 40 MG tablet Take 1 tablet (40 mg total) by mouth daily. 30 tablet 3  . insulin detemir (LEVEMIR) 100 UNIT/ML FlexPen Inject 25 Units into the skin daily. 15 mL 11  . latanoprost (XALATAN) 0.005 % ophthalmic solution Place 1 drop into both eyes at bedtime.     Marland Kitchen levothyroxine (SYNTHROID) 100 MCG tablet Take 100 mcg by mouth daily before  breakfast.    . melatonin 3 MG TABS tablet Take 3 mg by mouth at bedtime.    . metoprolol succinate (TOPROL-XL) 100 MG 24 hr tablet Take 100 mg by mouth daily. Take with or immediately following a meal.    . Paliperidone Palmitate (INVEGA SUSTENNA IM) Inject 1 pen into the muscle every 30 (thirty) days.    Marland Kitchen  polyethylene glycol (MIRALAX / GLYCOLAX) 17 g packet Take 17 g by mouth 2 (two) times daily. 14 each 0  . sacubitril-valsartan (ENTRESTO) 24-26 MG Take 1 tablet by mouth 2 (two) times daily.    Marland Kitchen senna (SENOKOT) 8.6 MG TABS tablet Take 2 tablets (17.2 mg total) by mouth 2 (two) times daily. 120 tablet 0  . spironolactone (ALDACTONE) 25 MG tablet Take 1 tablet (25 mg total) by mouth daily. 30 tablet 0  . tamsulosin (FLOMAX) 0.4 MG CAPS capsule Take 0.4 mg by mouth daily.    . blood glucose meter kit and supplies KIT Dispense based on patient and insurance preference. Use up to four times daily as directed. (FOR ICD-9 250.00, 250.01). 1 each 0  . Insulin Pen Needle (PEN NEEDLES 29GX1/2") 29G X 12MM MISC 1 each by Does not apply route daily. 100 each 0  . metoprolol succinate (TOPROL-XL) 50 MG 24 hr tablet Take 1 tablet (50 mg total) by mouth at bedtime. Take with or immediately following a meal. 30 tablet 0  . risperiDONE (RISPERDAL) 0.5 MG tablet Take 0.5 mg by mouth at bedtime.     PTA Medications: (Not in a hospital admission)   Musculoskeletal: Strength & Muscle Tone: decreased and ambulating with walker Gait & Station: unsteady, broad based Patient leans: Front  Psychiatric Specialty Exam: Physical Exam Vitals and nursing note reviewed.  Constitutional:      Appearance: Normal appearance.  HENT:     Head: Normocephalic.     Nose: Nose normal.  Pulmonary:     Effort: Pulmonary effort is normal.  Musculoskeletal:     Cervical back: Normal range of motion.  Neurological:     General: No focal deficit present.     Mental Status: He is alert and oriented to person, place, and  time.  Psychiatric:        Attention and Perception: Attention and perception normal.        Mood and Affect: Affect normal. Mood is depressed.        Speech: Speech normal.        Behavior: Behavior normal. Behavior is cooperative.        Thought Content: Thought content normal.        Cognition and Memory: Cognition and memory normal.        Judgment: Judgment normal.     Review of Systems  Psychiatric/Behavioral: The patient is nervous/anxious.   All other systems reviewed and are negative.   Blood pressure 90/61, pulse 90, temperature 98.4 F (36.9 C), resp. rate 18, SpO2 91 %.There is no height or weight on file to calculate BMI.  General Appearance: Disheveled  Eye Contact:  Fair  Speech:  Clear and Coherent and Normal Rate  Volume:  Normal  Mood:  Negative thoughts still there  Affect:  Congruent  Thought Process:  Coherent and Goal Directed  Orientation:  Full (Time, Place, and Person)  Thought Content:  Negative  Suicidal Thoughts:  No  Homicidal Thoughts:  No  Memory:  Immediate;   Good Recent;   Good Remote;   Good  Judgement:  Good  Insight:  Good  Psychomotor Activity:  Normal  Concentration:  Concentration: Good and Attention Span: Good  Recall:  Good  Fund of Knowledge:  Fair  Language:  Good  Akathisia:  No  Handed:  Left  AIMS (if indicated):     Assets:  Desire for Improvement  ADL's:  Intact  Cognition:  WNL  Sleep:  Demographic Factors:  Male  Loss Factors: Decline in physical health  Historical Factors: Prior suicide attempts  Risk Reduction Factors:   He is helping seeking   Continued Clinical Symptoms:  Previous Psychiatric Diagnoses and Treatments  Cognitive Features That Contribute To Risk:  None    Suicide Risk:  Minimal: No identifiable suicidal ideation.  Patients presenting with no risk factors but with morbid ruminations; may be classified as minimal risk based on the severity of the depressive symptoms   Plan  Of Care/Follow-up recommendations:  Restarted home medications per client's request (Depakote, Zoloft, Flexeril and Flomax based on the list he had when he lived in [place]:   Major depressive disorder, recurrent, moderate: -Reduced Risperdal 1 mg BID to 0.5 mg at bedtime -Started Depakote 500 mg BID -Started Zoloft 25 mg daily -Follow up with outpatient provider  BPH: Tamsulosin 0.74m daily  Back spasms: Flexeril 565mTID PRN  Disposition: Discharge back to assisted living home.  JaWaylan BogaNP 12/14/2020, 1:10 PM

## 2020-12-14 NOTE — ED Notes (Signed)
CBG 111 

## 2020-12-14 NOTE — ED Notes (Signed)
PT DISCHARGED WITH ALL BELONGINGS BACK TO [facility], NADN, PT DISCHARGED WITH BLUE BIRD TAXI.

## 2020-12-14 NOTE — ED Notes (Signed)
Breakfast Ordered 

## 2020-12-14 NOTE — ED Notes (Signed)
Social worker joe consulted at this time, and discharge instructions faxed to hazel with [facility]. Pt calm and cooperative, resting in stretcher, nadn.

## 2020-12-14 NOTE — ED Notes (Signed)
Call made for pt transportation with social worker, voicemail left,  Pt remains calm and cooperative.

## 2020-12-14 NOTE — ED Notes (Signed)
Hazel with [facility] [phone] reports patient can return to facility but that they do not offer transportation.

## 2020-12-14 NOTE — Discharge Instructions (Addendum)
Take your regular medicines.  Follow-up with cardiology and psychiatry.  Return for chest pain difficulty breathing or thoughts of hurting yourself or others.

## 2020-12-14 NOTE — ED Notes (Signed)
Lunch Tray Ordered @ 1105. 

## 2020-12-14 NOTE — ED Provider Notes (Signed)
Emergency Medicine Observation Re-evaluation Note  Charles [name] is a 55 y.o. male, seen on rounds today.  Pt initially presented to the ED for complaints of Suicidal Currently, the patient is resting in the hallway .  Physical Exam  BP 102/72 (BP Location: Right Arm)   Pulse 96   Temp 98.3 F (36.8 C) (Oral)   Resp 16   SpO2 95%  Physical Exam General: calm, resting Cardiac: warm and well perfused Lungs: even and unlabored Psych: calm  ED Course / MDM  EKG:EKG Interpretation  Date/Time:  Saturday December 13 2020 09:32:06 EST Ventricular Rate:  111 PR Interval:  174 QRS Duration: 110 QT Interval:  346 QTC Calculation: 470 R Axis:   157 Text Interpretation: Sinus tachycardia Biatrial enlargement Right axis deviation Septal infarct , age undetermined Abnormal ECG similar to Jan 2022 Confirmed by Sherwood Gambler [phone]) on 12/13/2020 12:37:54 PM  Clinical Course as of 12/14/20 0818  Sat Dec 13, 2020  0830 COVID+ 1/5 [CG]  0830 Asked pharmacy again to reconcile patient's meds. First call at 0730 am.  [CG]  1006 Pharmacy unable to reconcile patient's medicine. He doesn't know what he is taking and facility [facility] is not picking. I have re-ordered patient's cardiac medicines based on last cardiology note 1 week ago  [CG]    Clinical Course User Index [CG] Kinnie Feil, PA-C   I have reviewed the labs performed to date as well as medications administered while in observation.  Recent changes in the last 24 hours include no events overnight.  Plan  Current plan is for in patient placemet per psych. [name] not under full IVC at this time.   Lucrezia Starch, MD 12/14/20 [phone]

## 2020-12-15 NOTE — Pre-Procedure Instructions (Signed)
Charles [name]  12/15/2020      [id number] [address], [place] [address] Phone: [phone] Fax: [phone]  [facility] [place] [place] - [address] [place] [address] Phone: [phone] Fax: [phone]    Your procedure is scheduled on Feb. 10  Report to [facility] Entrance A at 10:00 A.M.  Call this number if you have problems the morning of surgery:  [phone]   Remember:  Do not eat or drink after midnight.      Take these medicines the morning of surgery with A SIP OF WATER :              Tylenol if needed              atorvastatin (lipitor)              Eye drops              Cyclobenzaprine (flexeril)              Digoxin (lanoxin)              Divalproex (depakote)              Levothyroxine (synthroid)              Metoprolol (toprol xl)              Sertraline (zoloft)              tamsulosin (flomax)               7 days prior to surgery STOP taking any Aspirin (unless otherwise instructed by your surgeon), Aleve, Naproxen, Ibuprofen, Motrin, Advil, Goody's, BC's, all herbal medications, fish oil, and all vitamins.                          How to Manage Your Diabetes Before and After Surgery  Why is it important to control my blood sugar before and after surgery? . Improving blood sugar levels before and after surgery helps healing and can limit problems. . A way of improving blood sugar control is eating a healthy diet by: o  Eating less sugar and carbohydrates o  Increasing activity/exercise o  Talking with your doctor about reaching your blood sugar goals . High blood sugars (greater than 180 mg/dL) can raise your risk of infections and slow your recovery, so you will need to focus on controlling your diabetes during the weeks before surgery. . Make sure that the doctor who takes care of  your diabetes knows about your planned surgery including the date and location.  How do I manage my blood sugar before surgery? . Check your blood sugar at least 4 times a day, starting 2 days before surgery, to make sure that the level is not too high or low. o Check your blood sugar the morning of your surgery when you wake up and every 2 hours until you get to the Short Stay unit. . If your blood sugar is less than 70 mg/dL, you will need to treat for low blood sugar: o Do not take insulin. o Treat a low blood sugar (less than 70 mg/dL) with  cup of clear juice (cranberry or apple), 4 glucose tablets, OR glucose gel. Recheck blood sugar in 15 minutes after treatment (  to make sure it is greater than 70 mg/dL). If your blood sugar is not greater than 70 mg/dL on recheck, call [phone] o  for further instructions. . Report your blood sugar to the short stay nurse when you get to Short Stay.  . If you are admitted to the hospital after surgery: o Your blood sugar will be checked by the staff and you will probably be given insulin after surgery (instead of oral diabetes medicines) to make sure you have good blood sugar levels. o The goal for blood sugar control after surgery is 80-180 mg/dL.       WHAT DO I DO ABOUT MY DIABETES MEDICATION?   Marland Kitchen Do not take oral diabetes medicines (pills) the morning of surgery.      . THE MORNING OF SURGERY, take ___12__________ units of __LEVEMIR________insulin.    Other Instructions: HOLD FARXIGA/DAPAGLIFLOZIN PROPANEDIOL THE DAY BEFORE SURGERY       Do not wear jewelry.  Do not wear lotions, powders, or perfumes, or deodorant.  Do not shave 48 hours prior to surgery.  Men may shave face and neck.  Do not bring valuables to the hospital.  [facility] is not responsible for any belongings or valuables.  Contacts, dentures or bridgework may not be worn into surgery.  Leave your suitcase in the car.  After surgery it may be brought to your  room.  For patients admitted to the hospital, discharge time will be determined by your treatment team.  Patients discharged the day of surgery will not be allowed to drive home.    Special instructions:  [facility]- Preparing For Surgery  Before surgery, you can play an important role. Because skin is not sterile, your skin needs to be as free of germs as possible. You can reduce the number of germs on your skin by washing with CHG (chlorahexidine gluconate) Soap before surgery.  CHG is an antiseptic cleaner which kills germs and bonds with the skin to continue killing germs even after washing.    Oral Hygiene is also important to reduce your risk of infection.  Remember - BRUSH YOUR TEETH THE MORNING OF SURGERY WITH YOUR REGULAR TOOTHPASTE  Please do not use if you have an allergy to CHG or antibacterial soaps. If your skin becomes reddened/irritated stop using the CHG.  Do not shave (including legs and underarms) for at least 48 hours prior to first CHG shower. It is OK to shave your face.  Please follow these instructions carefully.   1. Shower the NIGHT BEFORE SURGERY and the MORNING OF SURGERY with CHG.   2. If you chose to wash your hair, wash your hair first as usual with your normal shampoo.  3. After you shampoo, rinse your hair and body thoroughly to remove the shampoo.  4. Use CHG as you would any other liquid soap. You can apply CHG directly to the skin and wash gently with a scrungie or a clean washcloth.   5. Apply the CHG Soap to your body ONLY FROM THE NECK DOWN.  Do not use on open wounds or open sores. Avoid contact with your eyes, ears, mouth and genitals (private parts). Wash Face and genitals (private parts)  with your normal soap.  6. Wash thoroughly, paying special attention to the area where your surgery will be performed.  7. Thoroughly rinse your body with warm water from the neck down.  8. DO NOT shower/wash with your normal soap after using and rinsing  off the CHG  Soap.  9. Pat yourself dry with a CLEAN TOWEL.  10. Wear CLEAN PAJAMAS to bed the night before surgery, wear comfortable clothes the morning of surgery  11. Place CLEAN SHEETS on your bed the night of your first shower and DO NOT SLEEP WITH PETS.    Day of Surgery:  Do not apply any deodorants/lotions.  Please wear clean clothes to the hospital/surgery center.   Remember to brush your teeth WITH YOUR REGULAR TOOTHPASTE.    Please read over the following fact sheets that you were given.

## 2020-12-16 ENCOUNTER — Other Ambulatory Visit: Payer: Self-pay

## 2020-12-16 ENCOUNTER — Encounter (HOSPITAL_COMMUNITY): Payer: Self-pay

## 2020-12-16 ENCOUNTER — Encounter (HOSPITAL_COMMUNITY)
Admission: RE | Admit: 2020-12-16 | Discharge: 2020-12-16 | Disposition: A | Discharge status: Home / Self Care | Payer: Medicaid Other | Source: Ambulatory Visit | Attending: Thoracic Surgery (Cardiothoracic Vascular Surgery) | Admitting: Thoracic Surgery (Cardiothoracic Vascular Surgery)

## 2020-12-16 DIAGNOSIS — E119 Type 2 diabetes mellitus without complications: Secondary | ICD-10-CM | POA: Diagnosis not present

## 2020-12-16 DIAGNOSIS — Z01818 Encounter for other preprocedural examination: Secondary | ICD-10-CM

## 2020-12-16 DIAGNOSIS — Q214 Aortopulmonary septal defect: Secondary | ICD-10-CM | POA: Diagnosis present

## 2020-12-16 DIAGNOSIS — I700 Atherosclerosis of aorta: Secondary | ICD-10-CM | POA: Insufficient documentation

## 2020-12-16 DIAGNOSIS — I252 Old myocardial infarction: Secondary | ICD-10-CM | POA: Insufficient documentation

## 2020-12-16 DIAGNOSIS — I110 Hypertensive heart disease with heart failure: Secondary | ICD-10-CM | POA: Insufficient documentation

## 2020-12-16 DIAGNOSIS — I509 Heart failure, unspecified: Secondary | ICD-10-CM | POA: Diagnosis not present

## 2020-12-16 HISTORY — DX: Depression, unspecified: F32.A

## 2020-12-16 LAB — URINALYSIS, ROUTINE W REFLEX MICROSCOPIC
Bilirubin Urine: NEGATIVE
Glucose, UA: 150 mg/dL — AB
Hgb urine dipstick: NEGATIVE
Ketones, ur: NEGATIVE mg/dL
Leukocytes,Ua: NEGATIVE
Nitrite: NEGATIVE
Protein, ur: NEGATIVE mg/dL
Specific Gravity, Urine: 1.006 (ref 1.005–1.030)
pH: 6 (ref 5.0–8.0)

## 2020-12-16 LAB — COMPREHENSIVE METABOLIC PANEL
ALT: 20 U/L (ref 0–44)
AST: 25 U/L (ref 15–41)
Albumin: 3.9 g/dL (ref 3.5–5.0)
Alkaline Phosphatase: 96 U/L (ref 38–126)
Anion gap: 13 (ref 5–15)
BUN: 14 mg/dL (ref 6–20)
CO2: 19 mmol/L — ABNORMAL LOW (ref 22–32)
Calcium: 9.1 mg/dL (ref 8.9–10.3)
Chloride: 101 mmol/L (ref 98–111)
Creatinine, Ser: 1.1 mg/dL (ref 0.61–1.24)
GFR, Estimated: >60 mL/min (ref >60)
Glucose, Bld: 116 mg/dL — ABNORMAL HIGH (ref 70–99)
Potassium: 4.5 mmol/L (ref 3.5–5.1)
Sodium: 133 mmol/L — ABNORMAL LOW (ref 135–145)
Total Bilirubin: 1 mg/dL (ref 0.3–1.2)
Total Protein: 7 g/dL (ref 6.5–8.1)

## 2020-12-16 LAB — TYPE AND SCREEN
ABO/RH(D): O POS
Antibody Screen: NEGATIVE

## 2020-12-16 LAB — CBC
HCT: 51 % (ref 39.0–52.0)
Hemoglobin: 16.2 g/dL (ref 13.0–17.0)
MCH: 25.4 pg — ABNORMAL LOW (ref 26.0–34.0)
MCHC: 31.8 g/dL (ref 30.0–36.0)
MCV: 79.9 fL — ABNORMAL LOW (ref 80.0–100.0)
Platelets: 211 10*3/uL (ref 150–400)
RBC: 6.38 MIL/uL — ABNORMAL HIGH (ref 4.22–5.81)
RDW: 18.7 % — ABNORMAL HIGH (ref 11.5–15.5)
WBC: 10.4 10*3/uL (ref 4.0–10.5)
nRBC: 0 % (ref 0.0–0.2)

## 2020-12-16 LAB — BLOOD GAS, ARTERIAL
Acid-base deficit: 0 mmol/L (ref 0.0–2.0)
Bicarbonate: 24 mmol/L (ref 20.0–28.0)
Drawn by: 60281
FIO2: 21
O2 Saturation: 96.9 %
Patient temperature: 37
pCO2 arterial: 38.6 mmHg (ref 32.0–48.0)
pH, Arterial: 7.411 (ref 7.350–7.450)
pO2, Arterial: 89 mmHg (ref 83.0–108.0)

## 2020-12-16 LAB — PROTIME-INR
INR: 1 (ref 0.8–1.2)
Prothrombin Time: 13 seconds (ref 11.4–15.2)

## 2020-12-16 LAB — APTT: aPTT: 31 seconds (ref 24–36)

## 2020-12-16 LAB — GLUCOSE, CAPILLARY: Glucose-Capillary: 130 mg/dL — ABNORMAL HIGH (ref 70–99)

## 2020-12-16 NOTE — Progress Notes (Signed)
Anesthesia PAT Evaluation:  Case: [id number] Date/Time: 12/18/20 1145   Procedures:      XI ROBOTIC ASSISTED THORASCOPY FOR BIOPSY AP WINDOW LYMPH NODES (Left Chest)     LYMPH NODE BIOPSY (Left )   Anesthesia type: General   Pre-op diagnosis: AP WINDOW ADENOPATHY   Location: [facility] OR ROOM 10 / [place] [facility]   Surgeons: Melrose Nakayama, MD      DISCUSSION: [name] a 55 year old male scheduled for the above procedure. He was found to have mediastinal lymphadenopathy on 07/18/20 CTA chest done at [facility] for as part of LE edema evaluation. He was subsequently referred to oncology and CT surgery. Work-up process was somewhat prolonged due to two admissions in December with new diagnosis of acute systolic CHF with EF 21% and then for suicidal ideation (with last ED stay for SI 12/13/20-12/14/20). He was initially scheduled for surgery on 11/13/20, but 11/12/20 COVID-19 test came back positive. Surgery rescheduled for 12/01/20, but CXR concerning for multi-focal pneumonia and postponed again. Now rescheduled again for 12/18/20.   History includes smoking (~3-4 cig/day), HTN, DM2, Bipolar 1 disorder, nonischemic cardiomyopathy (no CAD, EF 97% 58/8325), systolic CHF (acute 49/8264), back surgery (~ 1/58/30, complicated by hematoma requiring take back). Prior polysubstance abuse (cocaine--will be nine years clean in 01/2021; denied current alcohol use).   He had HEM-ONC evaluation by Dr. Lorenso Courier on 09/25/20 for transiently elevated PTT with recent post-operative hematoma following April 2021 back surgery and for finding of mediastinal lymphadenopathy on 07/18/20 CTA. Subsequent PTTs were normal, but given anticipated plans for biopsy additional testing ordered. Based on notes and [facility] labs results, work-up for elevated PTT included:  1) 02/2020: patient developed an extensive hematoma following a decompression procedure with neurosurgery. Reported to have an elevated PTT at that time.  2) 02/26/2020: Pt 11.6, INR  0.98, aPTT 41.8  3) 02/27/2020: aPTT 34.4, Factor VIII 218, vWF activty 130%, Factor IX 211%, Factor XI 165% 4) 09/25/2020:establish care with Dr. Lorenso Courier. 09/25/2020 labs: LDH 222 (98-192 u/L), flow cytometry, PT 12.4, INR 1.0, aPTT 29, uric acid 9.3 (3.7-8.6 mg/dL), thrombin time 20.9 sec (0.0-23.0 sec), platelet function assay interpreted as normal (collagen/epinephrine 154 [0-193 second]), flow cytometry showed no monoclonal B-cell population or significant T-cell abnormalities identified.  - ED stay 10/28/20-11/03/20 and 12/13/20-12/14/20 for suicidal thoughts.   - ED visit 11/29/20 for cough with associated chest soreness. Chest CTA consistent with COVID-19 pneumonia but negative for PE. HR 120, but no tachypnea or hypoxia. Troponin negative x2. Discharged back to facility.    - Admission 10/11/15-/10/23/20 for dyspnea and tachycardia. He required 2L/O2. Chest CT negative for PE, but working mediastinal lymphadenopathy (had pending CT surgery evaluation at that time). Echo showed LVEF 20%. Cardiology consulted. Normal coronaries by cath. Started on HF medications. DM meds adjusted for A1c 8.1.   As of 11/04/20, Dr Roxan Hockey classified his Zubrod Score as 1: Restricted in physical strenuous activity but ambulatory, able to do out light work.  Patient resides at [facility]. He says a medical provider comes to the facility monthly (has been [employer]). I confirmed with facility staff last month that he was receiving HF medications as prescribed. He says he remains compliant with taking the medications given to him.   He remains tachycardic (~ 110-120) which has been rather chronic over the past few months, but asymptomatic. Clinically, I think he looks better then when I evaluated him on 11/2020 and subjectively he thinks he  feels better with improvement in coughing. 12/16/20 CXR showed persistent cardiomegaly and pulmonary vascular congestion, but no active lung disease.  Preoperative labs acceptable for OR. Anesthesia team to evaluate on the day of surgery.    VS: BP (!) 105/93   Pulse (!) 117   Temp 36.6 C (Oral)   Resp 20   Ht 5' 9" (1.753 m)   Wt 114.9 kg   SpO2 98%   BMI 37.41 kg/m  Patient evaluated at PAT visit on 12/16/20. Provider wore N95, universal face mask, and face shield. Patient wore face mask. Patient reports that coughing has overall improved since his 11/27/20 PAT visit. Still with intermittent cough, primarily dry but says he has not had much coughing at all over the past three days. No new DOE or decrease in energy/endurance. His HR remains intermittently elevated, but denied any new symptoms since last visit. Heart rhythm regular with HR at 120 bpm. No murmur noted. Lungs with diminished bases but otherwise clear. Mild pretibial edema. No coughing or conversational dyspnea noted. He appears in better spirits from when I evaluated him on 11/27/20. He is using a Teaching laboratory technician.    PROVIDERS: - Sees [facility] while living at [place], Clista Bernhardt, MD is HEM-ONC - [name], MD is cardiologist. Initially consulted 10/12/20 during CHF admission. Last visit 10/23/20 by Lyman Bishop, MD prior to discharge home. Tachycardia overall improved on Toprol. Continue medical therapy for CHF. Patient was awaiting plans for biopsy for mediastinal adenopathy. Future out-patient follow-up planned with Dr. Harrell Gave, but has not yet been scheduled.    LABS: Labs reviewed: Acceptable for surgery. (all labs ordered are listed, but only abnormal results are displayed)  Labs Reviewed  GLUCOSE, CAPILLARY - Abnormal; Notable for the following components:      Result Value   Glucose-Capillary 130 (*)    All other components within normal limits  CBC - Abnormal; Notable for the following components:   RBC 6.38 (*)    MCV 79.9 (*)    MCH 25.4 (*)    RDW 18.7 (*)    All other components within normal  limits  COMPREHENSIVE METABOLIC PANEL - Abnormal; Notable for the following components:   Sodium 133 (*)    CO2 19 (*)    Glucose, Bld 116 (*)    All other components within normal limits  BLOOD GAS, ARTERIAL - Abnormal; Notable for the following components:   Allens test (pass/fail) BRACHIAL ARTERY (*)    All other components within normal limits  URINALYSIS, ROUTINE W REFLEX MICROSCOPIC - Abnormal; Notable for the following components:   Color, Urine STRAW (*)    Glucose, UA 150 (*)    All other components within normal limits  PROTIME-INR  APTT  TYPE AND SCREEN     IMAGES: CXR 12/16/20:  FINDINGS: Cardiomegaly and pulmonary vascular congestion are again seen. No evidence of pulmonary edema or focal infiltrate. No evidence of pleural effusion. IMPRESSION: Cardiomegaly and pulmonary vascular congestion. No active lung disease.  CTA Chest 11/29/20: IMPRESSION: 1. No evidence of pulmonary embolus. 2. Basilar predominant interstitial and ground-glass opacities most consistent with COVID-19 pneumonia given clinical history. 3. Mediastinal and hilar lymphadenopathy. This may be a combination of known underlying lymphoma with superimposed reactive change given pneumonia. 4. Stable left ventricular dilatation.  [place] Abd 10/11/20: IMPRESSION: No ascites is identified.  PET Scan 09/23/20: IMPRESSION: 1. Prevascular adenopathy with individual nodes measuring up to 2.4 cm in diameter, maximum SUV  6.4 (Deauville 4 activity). 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 No adenopathy or suspicious hypermetabolic activity in the neck, abdomen/pelvis, or skeleton. 2. Hepatic steatosis. 3. Nonobstructive left nephrolithiasis. 4. Prostatomegaly. 5. Aortic Atherosclerosis (ICD10-I70.0).   EKG: EKG 12/13/20:  Sinus tachycardia at 111 bpm Biatrial enlargement Right axis deviation Septal infarct , age undetermined Abnormal ECG similar to Jan  2022 Confirmed by Sherwood Gambler [phone]) on 12/13/2020 12:37:54 PM  EKG 11/27/20: Ventricular rate @ 121 bpm Sinus tachycardia Left axis deviation Nonspecific T wave abnormality Abnormal ECG No significant change since last tracing Confirmed by Pattricia Boss [phone]) on 11/28/2020 12:21:52 PM    CV: RHC/LHC 69/4/85:  LV end diastolic pressure is severely elevated. LVEDP 29 mm Hg.  There is no aortic valve stenosis.  No angiographically apparent CAD.  Ao sat 98%, PA sat 69%, PA pressure 34/22, mean 26 mm Hg; mean PCWP 26 mm Hg; CO 4.96 L/min; CI 2.09 Continue medical therapy for nonischemic cardiomyopathy.    Echo 10/11/20: IMPRESSIONS  1. Left ventricular ejection fraction, by estimation, is 20 to 25%. The  left ventricle has severely decreased function. The left ventricle  demonstrates global hypokinesis. The left ventricular internal cavity size  was severely dilated. Left ventricular  diastolic parameters are indeterminate.  2. Right ventricular systolic function was not well visualized. The right  ventricular size is not well visualized.  3. The mitral valve is normal in structure. No evidence of mitral valve  regurgitation.  4. The aortic valve was not well visualized. Aortic valve regurgitation  is not visualized. No aortic stenosis is present.    Past Medical History:  Diagnosis Date  . Arthritis    lower back  . Bipolar 1 disorder ([place])   . Cardiomyopathy, nonischemic ([place])    no CAD 10/14/20 cath, EF 20-25% by 10/11/20 echo  . CHF (congestive heart failure) ([place])   . COVID 10/2020  . Depression   . DM2 (diabetes mellitus, type 2) ([place])   . HTN (hypertension)   . Myocardial infarction [facility])     Past Surgical History:  Procedure Laterality Date  . BACK SURGERY  01/2020  . CARDIAC CATHETERIZATION  10/2020  . CHOLECYSTECTOMY    . HERNIA REPAIR    . RIGHT/LEFT HEART CATH AND CORONARY ANGIOGRAPHY N/A 10/14/2020   Procedure: RIGHT/LEFT HEART CATH AND  CORONARY ANGIOGRAPHY;  Surgeon: Jettie Booze, MD;  Location: [facility];  Service: Cardiovascular;  Laterality: N/A;  . SPINAL FIXATION SURGERY  02/26/2020    MEDICATIONS: . acetaminophen (TYLENOL) 325 MG tablet  . atorvastatin (LIPITOR) 10 MG tablet  . blood glucose meter kit and supplies KIT  . brimonidine (ALPHAGAN) 0.2 % ophthalmic solution  . cyclobenzaprine (FLEXERIL) 10 MG tablet  . dapagliflozin propanediol (FARXIGA) 10 MG TABS tablet  . digoxin (LANOXIN) 0.125 MG tablet  . divalproex (DEPAKOTE) 500 MG DR tablet  . furosemide (LASIX) 40 MG tablet  . insulin detemir (LEVEMIR) 100 UNIT/ML FlexPen  . Insulin Pen Needle (PEN NEEDLES 29GX1/2") 29G X 12MM MISC  . latanoprost (XALATAN) 0.005 % ophthalmic solution  . levothyroxine (SYNTHROID) 100 MCG tablet  . melatonin 3 MG TABS tablet  . metoprolol succinate (TOPROL-XL) 100 MG 24 hr tablet  . metoprolol succinate (TOPROL-XL) 50 MG 24 hr tablet  . Paliperidone Palmitate (INVEGA SUSTENNA IM)  . polyethylene glycol (MIRALAX / GLYCOLAX) 17 g packet  . risperiDONE (RISPERDAL) 0.5 MG tablet  . sacubitril-valsartan (ENTRESTO) 24-26 MG  . senna (SENOKOT) 8.6 MG  TABS tablet  . sertraline (ZOLOFT) 25 MG tablet  . spironolactone (ALDACTONE) 25 MG tablet  . tamsulosin (FLOMAX) 0.4 MG CAPS capsule   No current facility-administered medications for this encounter.    Myra Gianotti, PA-C Surgical Short Stay/Anesthesiology [facility] Phone [phone] [facility] Phone [phone] 12/17/2020 8:59 AM

## 2020-12-16 NOTE — Progress Notes (Signed)
PCP - [facility]      THEIR [facility] Cardiologist - NA     Chest x-ray - 12/16/20 EKG - 12/13/20 Stress Test - NA ECHO - 12/14 Cardiac Cath - 12/21      Fasting Blood Sugar - ? Checks Blood Sugar __OCC___ times a day   Aspirin Instructions:STOP  COVID TEST- DONE 11/12/20   Anesthesia review: DISCUSSED WITH ALLISON PA  Patient denies shortness of breath, fever, cough and chest pain at PAT appointment   All instructions explained to the patient, with a verbal understanding of the material. Patient agrees to go over the instructions while at home for a better understanding. Patient also instructed to self quarantine after being tested for COVID-19. The opportunity to ask questions was provided.

## 2020-12-17 MED ORDER — VANCOMYCIN HCL 1500 MG/300ML IV SOLN
1500.0000 mg | INTRAVENOUS | Status: AC
  Administered 2020-12-18: 1500 mg via INTRAVENOUS
  Filled 2020-12-17: qty 300

## 2020-12-18 ENCOUNTER — Inpatient Hospital Stay (HOSPITAL_COMMUNITY)
Admission: RE | Admit: 2020-12-18 | Discharge: 2020-12-24 | DRG: 802 | Disposition: A | Discharge status: Skilled Nursing Facility | Payer: Medicaid Other | Attending: Thoracic Surgery (Cardiothoracic Vascular Surgery) | Admitting: Thoracic Surgery (Cardiothoracic Vascular Surgery)

## 2020-12-18 ENCOUNTER — Inpatient Hospital Stay (HOSPITAL_COMMUNITY): Payer: Medicaid Other

## 2020-12-18 ENCOUNTER — Inpatient Hospital Stay (HOSPITAL_COMMUNITY): Payer: Medicaid Other | Admitting: Vascular Surgery

## 2020-12-18 ENCOUNTER — Encounter (HOSPITAL_COMMUNITY)
Admission: RE | Disposition: A | Discharge status: Skilled Nursing Facility | Payer: Self-pay | Source: Home / Self Care | Attending: Thoracic Surgery (Cardiothoracic Vascular Surgery)

## 2020-12-18 ENCOUNTER — Encounter (HOSPITAL_COMMUNITY): Payer: Self-pay | Admitting: Thoracic Surgery (Cardiothoracic Vascular Surgery)

## 2020-12-18 DIAGNOSIS — R590 Localized enlarged lymph nodes: Secondary | ICD-10-CM | POA: Diagnosis present

## 2020-12-18 DIAGNOSIS — E119 Type 2 diabetes mellitus without complications: Secondary | ICD-10-CM | POA: Diagnosis present

## 2020-12-18 DIAGNOSIS — I472 Ventricular tachycardia: Secondary | ICD-10-CM | POA: Diagnosis present

## 2020-12-18 DIAGNOSIS — Z8249 Family history of ischemic heart disease and other diseases of the circulatory system: Secondary | ICD-10-CM | POA: Diagnosis not present

## 2020-12-18 DIAGNOSIS — N200 Calculus of kidney: Secondary | ICD-10-CM | POA: Diagnosis present

## 2020-12-18 DIAGNOSIS — F319 Bipolar disorder, unspecified: Secondary | ICD-10-CM | POA: Diagnosis present

## 2020-12-18 DIAGNOSIS — I110 Hypertensive heart disease with heart failure: Secondary | ICD-10-CM | POA: Diagnosis present

## 2020-12-18 DIAGNOSIS — I700 Atherosclerosis of aorta: Secondary | ICD-10-CM | POA: Diagnosis present

## 2020-12-18 DIAGNOSIS — Z09 Encounter for follow-up examination after completed treatment for conditions other than malignant neoplasm: Secondary | ICD-10-CM

## 2020-12-18 DIAGNOSIS — Z809 Family history of malignant neoplasm, unspecified: Secondary | ICD-10-CM | POA: Diagnosis not present

## 2020-12-18 DIAGNOSIS — Q214 Aortopulmonary septal defect: Secondary | ICD-10-CM

## 2020-12-18 DIAGNOSIS — Z886 Allergy status to analgesic agent status: Secondary | ICD-10-CM | POA: Diagnosis not present

## 2020-12-18 DIAGNOSIS — Z79899 Other long term (current) drug therapy: Secondary | ICD-10-CM

## 2020-12-18 DIAGNOSIS — I428 Other cardiomyopathies: Secondary | ICD-10-CM | POA: Diagnosis present

## 2020-12-18 DIAGNOSIS — E669 Obesity, unspecified: Secondary | ICD-10-CM | POA: Diagnosis present

## 2020-12-18 DIAGNOSIS — Z79890 Hormone replacement therapy (postmenopausal): Secondary | ICD-10-CM

## 2020-12-18 DIAGNOSIS — F419 Anxiety disorder, unspecified: Secondary | ICD-10-CM | POA: Diagnosis present

## 2020-12-18 DIAGNOSIS — Z880 Allergy status to penicillin: Secondary | ICD-10-CM

## 2020-12-18 DIAGNOSIS — Z794 Long term (current) use of insulin: Secondary | ICD-10-CM

## 2020-12-18 DIAGNOSIS — I255 Ischemic cardiomyopathy: Secondary | ICD-10-CM | POA: Diagnosis not present

## 2020-12-18 DIAGNOSIS — F17210 Nicotine dependence, cigarettes, uncomplicated: Secondary | ICD-10-CM | POA: Diagnosis present

## 2020-12-18 DIAGNOSIS — N400 Enlarged prostate without lower urinary tract symptoms: Secondary | ICD-10-CM | POA: Diagnosis present

## 2020-12-18 DIAGNOSIS — E785 Hyperlipidemia, unspecified: Secondary | ICD-10-CM | POA: Diagnosis present

## 2020-12-18 DIAGNOSIS — Z9049 Acquired absence of other specified parts of digestive tract: Secondary | ICD-10-CM

## 2020-12-18 DIAGNOSIS — E860 Dehydration: Secondary | ICD-10-CM | POA: Diagnosis present

## 2020-12-18 DIAGNOSIS — I5023 Acute on chronic systolic (congestive) heart failure: Secondary | ICD-10-CM | POA: Diagnosis not present

## 2020-12-18 DIAGNOSIS — I493 Ventricular premature depolarization: Secondary | ICD-10-CM | POA: Diagnosis present

## 2020-12-18 DIAGNOSIS — Z20822 Contact with and (suspected) exposure to covid-19: Secondary | ICD-10-CM | POA: Diagnosis present

## 2020-12-18 DIAGNOSIS — Z83438 Family history of other disorder of lipoprotein metabolism and other lipidemia: Secondary | ICD-10-CM | POA: Diagnosis not present

## 2020-12-18 DIAGNOSIS — E039 Hypothyroidism, unspecified: Secondary | ICD-10-CM | POA: Diagnosis present

## 2020-12-18 DIAGNOSIS — R0989 Other specified symptoms and signs involving the circulatory and respiratory systems: Secondary | ICD-10-CM | POA: Diagnosis present

## 2020-12-18 DIAGNOSIS — R791 Abnormal coagulation profile: Secondary | ICD-10-CM | POA: Diagnosis present

## 2020-12-18 DIAGNOSIS — R000 Tachycardia, unspecified: Secondary | ICD-10-CM | POA: Diagnosis not present

## 2020-12-18 DIAGNOSIS — I5043 Acute on chronic combined systolic (congestive) and diastolic (congestive) heart failure: Secondary | ICD-10-CM | POA: Diagnosis present

## 2020-12-18 DIAGNOSIS — E1169 Type 2 diabetes mellitus with other specified complication: Secondary | ICD-10-CM | POA: Diagnosis not present

## 2020-12-18 DIAGNOSIS — Z823 Family history of stroke: Secondary | ICD-10-CM

## 2020-12-18 DIAGNOSIS — I10 Essential (primary) hypertension: Secondary | ICD-10-CM | POA: Diagnosis not present

## 2020-12-18 DIAGNOSIS — K30 Functional dyspepsia: Secondary | ICD-10-CM | POA: Diagnosis present

## 2020-12-18 DIAGNOSIS — I5022 Chronic systolic (congestive) heart failure: Secondary | ICD-10-CM | POA: Diagnosis not present

## 2020-12-18 DIAGNOSIS — F1410 Cocaine abuse, uncomplicated: Secondary | ICD-10-CM | POA: Diagnosis present

## 2020-12-18 DIAGNOSIS — I252 Old myocardial infarction: Secondary | ICD-10-CM

## 2020-12-18 DIAGNOSIS — E875 Hyperkalemia: Secondary | ICD-10-CM | POA: Diagnosis present

## 2020-12-18 DIAGNOSIS — J811 Chronic pulmonary edema: Secondary | ICD-10-CM

## 2020-12-18 DIAGNOSIS — K760 Fatty (change of) liver, not elsewhere classified: Secondary | ICD-10-CM | POA: Diagnosis present

## 2020-12-18 HISTORY — PX: INTERCOSTAL NERVE BLOCK: SHX5021

## 2020-12-18 HISTORY — PX: OTHER SURGICAL HISTORY: SHX169

## 2020-12-18 HISTORY — PX: LYMPH NODE BIOPSY: SHX201

## 2020-12-18 LAB — GLUCOSE, CAPILLARY
Glucose-Capillary: 123 mg/dL — ABNORMAL HIGH (ref 70–99)
Glucose-Capillary: 190 mg/dL — ABNORMAL HIGH (ref 70–99)
Glucose-Capillary: 145 mg/dL — ABNORMAL HIGH (ref 70–99)
Glucose-Capillary: 233 mg/dL — ABNORMAL HIGH (ref 70–99)

## 2020-12-18 SURGERY — THORACOSCOPY, ROBOT-ASSISTED
Anesthesia: General | Site: Chest | Laterality: Left

## 2020-12-18 MED ORDER — SENNA 8.6 MG PO TABS
2.0000 | ORAL_TABLET | Freq: Two times a day (BID) | ORAL | Status: DC
  Administered 2020-12-19 – 2020-12-24 (×7): 17.2 mg via ORAL
  Filled 2020-12-18 (×8): qty 2

## 2020-12-18 MED ORDER — OXYCODONE HCL 5 MG PO TABS: 5.0000 mg | ORAL_TABLET | Freq: Once | ORAL | Status: DC | PRN

## 2020-12-18 MED ORDER — SERTRALINE HCL 25 MG PO TABS
25.0000 mg | ORAL_TABLET | Freq: Every day | ORAL | Status: DC
  Administered 2020-12-19 – 2020-12-24 (×6): 25 mg via ORAL
  Filled 2020-12-18 (×6): qty 1

## 2020-12-18 MED ORDER — ONDANSETRON HCL 4 MG/2ML IJ SOLN
4.0000 mg | Freq: Four times a day (QID) | INTRAMUSCULAR | Status: DC | PRN
  Administered 2020-12-19 – 2020-12-20 (×2): 4 mg via INTRAVENOUS
  Filled 2020-12-18 (×2): qty 2

## 2020-12-18 MED ORDER — SPIRONOLACTONE 25 MG PO TABS: 25.0000 mg | ORAL_TABLET | Freq: Every day | ORAL | Status: DC

## 2020-12-18 MED ORDER — CHLORHEXIDINE GLUCONATE 0.12 % MT SOLN
15.0000 mL | Freq: Once | OROMUCOSAL | Status: AC
  Administered 2020-12-18: 15 mL via OROMUCOSAL
  Filled 2020-12-18: qty 15

## 2020-12-18 MED ORDER — BUPIVACAINE LIPOSOME 1.3 % IJ SUSP
20.0000 mL | INTRAMUSCULAR | Status: DC
  Filled 2020-12-18: qty 20

## 2020-12-18 MED ORDER — PHENYLEPHRINE HCL-NACL 10-0.9 MG/250ML-% IV SOLN
INTRAVENOUS | Status: DC | PRN
  Administered 2020-12-18: 50 ug/min via INTRAVENOUS

## 2020-12-18 MED ORDER — INSULIN ASPART 100 UNIT/ML ~~LOC~~ SOLN
0.0000 [IU] | Freq: Three times a day (TID) | SUBCUTANEOUS | Status: DC
  Administered 2020-12-19: 2 [IU] via SUBCUTANEOUS
  Administered 2020-12-20: 4 [IU] via SUBCUTANEOUS
  Administered 2020-12-20: 8 [IU] via SUBCUTANEOUS
  Administered 2020-12-20 – 2020-12-23 (×8): 2 [IU] via SUBCUTANEOUS
  Administered 2020-12-23: 4 [IU] via SUBCUTANEOUS
  Administered 2020-12-24: 2 [IU] via SUBCUTANEOUS

## 2020-12-18 MED ORDER — ACETAMINOPHEN 160 MG/5ML PO SOLN: 1000.0000 mg | Freq: Four times a day (QID) | ORAL | Status: AC

## 2020-12-18 MED ORDER — MIDAZOLAM HCL 2 MG/2ML IJ SOLN
INTRAMUSCULAR | Status: AC
  Filled 2020-12-18: qty 2

## 2020-12-18 MED ORDER — SODIUM CHLORIDE (PF) 0.9 % IJ SOLN
INTRAMUSCULAR | Status: AC
  Filled 2020-12-18: qty 10

## 2020-12-18 MED ORDER — BISACODYL 5 MG PO TBEC
10.0000 mg | DELAYED_RELEASE_TABLET | Freq: Every day | ORAL | Status: DC
  Administered 2020-12-24: 10 mg via ORAL
  Filled 2020-12-18 (×3): qty 2

## 2020-12-18 MED ORDER — DEXAMETHASONE SODIUM PHOSPHATE 10 MG/ML IJ SOLN
INTRAMUSCULAR | Status: AC
  Filled 2020-12-18: qty 2

## 2020-12-18 MED ORDER — FENTANYL CITRATE (PF) 250 MCG/5ML IJ SOLN
INTRAMUSCULAR | Status: DC | PRN
  Administered 2020-12-18: 50 ug via INTRAVENOUS
  Administered 2020-12-18: 100 ug via INTRAVENOUS

## 2020-12-18 MED ORDER — HEMOSTATIC AGENTS (NO CHARGE) OPTIME
TOPICAL | Status: DC | PRN
  Administered 2020-12-18: 1 via TOPICAL

## 2020-12-18 MED ORDER — ONDANSETRON HCL 4 MG/2ML IJ SOLN: 4.0000 mg | Freq: Four times a day (QID) | INTRAMUSCULAR | Status: DC | PRN

## 2020-12-18 MED ORDER — TRAMADOL HCL 50 MG PO TABS
50.0000 mg | ORAL_TABLET | Freq: Four times a day (QID) | ORAL | Status: DC | PRN
  Administered 2020-12-18: 100 mg via ORAL
  Administered 2020-12-18: 50 mg via ORAL
  Administered 2020-12-20: 100 mg via ORAL
  Filled 2020-12-18: qty 1
  Filled 2020-12-18 (×3): qty 2

## 2020-12-18 MED ORDER — BRIMONIDINE TARTRATE 0.2 % OP SOLN
1.0000 [drp] | Freq: Two times a day (BID) | OPHTHALMIC | Status: DC
  Administered 2020-12-18 – 2020-12-24 (×12): 1 [drp] via OPHTHALMIC
  Filled 2020-12-18: qty 5

## 2020-12-18 MED ORDER — ROCURONIUM BROMIDE 10 MG/ML (PF) SYRINGE
PREFILLED_SYRINGE | INTRAVENOUS | Status: DC | PRN
  Administered 2020-12-18: 20 mg via INTRAVENOUS
  Administered 2020-12-18: 60 mg via INTRAVENOUS

## 2020-12-18 MED ORDER — 0.9 % SODIUM CHLORIDE (POUR BTL) OPTIME
TOPICAL | Status: DC | PRN
  Administered 2020-12-18: 1000 mL

## 2020-12-18 MED ORDER — ENOXAPARIN SODIUM 40 MG/0.4ML ~~LOC~~ SOLN
40.0000 mg | SUBCUTANEOUS | Status: DC
  Administered 2020-12-18 – 2020-12-23 (×6): 40 mg via SUBCUTANEOUS
  Filled 2020-12-18 (×6): qty 0.4

## 2020-12-18 MED ORDER — SUGAMMADEX SODIUM 200 MG/2ML IV SOLN
INTRAVENOUS | Status: DC | PRN
  Administered 2020-12-18: 230 mg via INTRAVENOUS

## 2020-12-18 MED ORDER — FENTANYL CITRATE (PF) 100 MCG/2ML IJ SOLN
25.0000 ug | INTRAMUSCULAR | Status: DC | PRN
Start: 2020-12-18 — End: 2020-12-18

## 2020-12-18 MED ORDER — ORAL CARE MOUTH RINSE: 15.0000 mL | Freq: Once | OROMUCOSAL | Status: AC

## 2020-12-18 MED ORDER — DEXAMETHASONE SODIUM PHOSPHATE 10 MG/ML IJ SOLN
INTRAMUSCULAR | Status: DC | PRN
  Administered 2020-12-18: 10 mg via INTRAVENOUS

## 2020-12-18 MED ORDER — MELATONIN 3 MG PO TABS
3.0000 mg | ORAL_TABLET | Freq: Every day | ORAL | Status: DC
  Administered 2020-12-18 – 2020-12-23 (×5): 3 mg via ORAL
  Filled 2020-12-18 (×6): qty 1

## 2020-12-18 MED ORDER — SACUBITRIL-VALSARTAN 24-26 MG PO TABS
1.0000 | ORAL_TABLET | Freq: Two times a day (BID) | ORAL | Status: DC
  Administered 2020-12-18 – 2020-12-24 (×12): 1 via ORAL
  Filled 2020-12-18 (×12): qty 1

## 2020-12-18 MED ORDER — ATORVASTATIN CALCIUM 10 MG PO TABS
10.0000 mg | ORAL_TABLET | Freq: Every day | ORAL | Status: DC
Start: 2020-12-19 — End: 2020-12-24
  Administered 2020-12-19 – 2020-12-24 (×6): 10 mg via ORAL
  Filled 2020-12-18 (×6): qty 1

## 2020-12-18 MED ORDER — INSULIN ASPART 100 UNIT/ML ~~LOC~~ SOLN
0.0000 [IU] | SUBCUTANEOUS | Status: DC
  Administered 2020-12-18: 2 [IU] via SUBCUTANEOUS

## 2020-12-18 MED ORDER — EPINEPHRINE 1 MG/10ML IJ SOSY
PREFILLED_SYRINGE | INTRAMUSCULAR | Status: DC | PRN
  Administered 2020-12-18: .01 mg via INTRAVENOUS

## 2020-12-18 MED ORDER — DIVALPROEX SODIUM 500 MG PO DR TAB
500.0000 mg | DELAYED_RELEASE_TABLET | Freq: Two times a day (BID) | ORAL | Status: DC
  Administered 2020-12-18 – 2020-12-24 (×12): 500 mg via ORAL
  Filled 2020-12-18 (×13): qty 1

## 2020-12-18 MED ORDER — TRAMADOL HCL 50 MG PO TABS
ORAL_TABLET | ORAL | Status: AC
  Filled 2020-12-18: qty 1

## 2020-12-18 MED ORDER — FENTANYL CITRATE (PF) 250 MCG/5ML IJ SOLN
INTRAMUSCULAR | Status: AC
  Filled 2020-12-18: qty 5

## 2020-12-18 MED ORDER — FUROSEMIDE 40 MG PO TABS
40.0000 mg | ORAL_TABLET | Freq: Every day | ORAL | Status: DC
  Administered 2020-12-19: 40 mg via ORAL
  Filled 2020-12-18: qty 1

## 2020-12-18 MED ORDER — PROPOFOL 10 MG/ML IV BOLUS
INTRAVENOUS | Status: DC | PRN
  Administered 2020-12-18: 130 mg via INTRAVENOUS

## 2020-12-18 MED ORDER — LACTATED RINGERS IV SOLN: INTRAVENOUS | Status: DC | PRN

## 2020-12-18 MED ORDER — RISPERIDONE 0.5 MG PO TABS
0.5000 mg | ORAL_TABLET | Freq: Every day | ORAL | Status: DC
  Administered 2020-12-18 – 2020-12-23 (×6): 0.5 mg via ORAL
  Filled 2020-12-18 (×7): qty 1

## 2020-12-18 MED ORDER — BUPIVACAINE HCL (PF) 0.5 % IJ SOLN
INTRAMUSCULAR | Status: AC
  Filled 2020-12-18: qty 30

## 2020-12-18 MED ORDER — LACTATED RINGERS IV SOLN: INTRAVENOUS | Status: DC

## 2020-12-18 MED ORDER — METOPROLOL SUCCINATE ER 100 MG PO TB24
100.0000 mg | ORAL_TABLET | Freq: Every day | ORAL | Status: DC
  Administered 2020-12-20 – 2020-12-21 (×2): 100 mg via ORAL
  Filled 2020-12-18 (×3): qty 1

## 2020-12-18 MED ORDER — EPINEPHRINE 1 MG/10ML IJ SOSY
PREFILLED_SYRINGE | INTRAMUSCULAR | Status: AC
  Filled 2020-12-18: qty 10

## 2020-12-18 MED ORDER — KETOROLAC TROMETHAMINE 15 MG/ML IJ SOLN
15.0000 mg | Freq: Four times a day (QID) | INTRAMUSCULAR | Status: AC
  Administered 2020-12-18 – 2020-12-19 (×3): 15 mg via INTRAVENOUS
  Filled 2020-12-18 (×3): qty 1

## 2020-12-18 MED ORDER — LEVOTHYROXINE SODIUM 100 MCG PO TABS
100.0000 ug | ORAL_TABLET | Freq: Every day | ORAL | Status: DC
  Administered 2020-12-19 – 2020-12-24 (×6): 100 ug via ORAL
  Filled 2020-12-18 (×6): qty 1

## 2020-12-18 MED ORDER — OXYCODONE HCL 5 MG/5ML PO SOLN: 5.0000 mg | Freq: Once | ORAL | Status: DC | PRN

## 2020-12-18 MED ORDER — INSULIN ASPART 100 UNIT/ML ~~LOC~~ SOLN: 0.0000 [IU] | Freq: Three times a day (TID) | SUBCUTANEOUS | Status: DC

## 2020-12-18 MED ORDER — SODIUM CHLORIDE FLUSH 0.9 % IV SOLN
INTRAVENOUS | Status: DC | PRN
  Administered 2020-12-18: 96 mL

## 2020-12-18 MED ORDER — METOPROLOL SUCCINATE ER 50 MG PO TB24: 50.0000 mg | ORAL_TABLET | Freq: Every day | ORAL | Status: DC

## 2020-12-18 MED ORDER — ONDANSETRON HCL 4 MG/2ML IJ SOLN
INTRAMUSCULAR | Status: AC
  Filled 2020-12-18: qty 4

## 2020-12-18 MED ORDER — ROCURONIUM BROMIDE 10 MG/ML (PF) SYRINGE
PREFILLED_SYRINGE | INTRAVENOUS | Status: AC
  Filled 2020-12-18: qty 20

## 2020-12-18 MED ORDER — POLYETHYLENE GLYCOL 3350 17 G PO PACK
17.0000 g | PACK | Freq: Two times a day (BID) | ORAL | Status: DC
  Administered 2020-12-19 – 2020-12-24 (×5): 17 g via ORAL
  Filled 2020-12-18 (×6): qty 1

## 2020-12-18 MED ORDER — SODIUM CHLORIDE 0.9 % IV SOLN: INTRAVENOUS | Status: DC | PRN

## 2020-12-18 MED ORDER — VANCOMYCIN HCL 1000 MG/200ML IV SOLN
1000.0000 mg | Freq: Two times a day (BID) | INTRAVENOUS | Status: AC
  Administered 2020-12-18: 1000 mg via INTRAVENOUS
  Filled 2020-12-18: qty 200

## 2020-12-18 MED ORDER — PHENYLEPHRINE 40 MCG/ML (10ML) SYRINGE FOR IV PUSH (FOR BLOOD PRESSURE SUPPORT)
PREFILLED_SYRINGE | INTRAVENOUS | Status: AC
  Filled 2020-12-18: qty 10

## 2020-12-18 MED ORDER — DAPAGLIFLOZIN PROPANEDIOL 10 MG PO TABS
10.0000 mg | ORAL_TABLET | Freq: Every day | ORAL | Status: DC
Start: 2020-12-19 — End: 2020-12-24
  Administered 2020-12-19 – 2020-12-24 (×6): 10 mg via ORAL
  Filled 2020-12-18 (×6): qty 1

## 2020-12-18 MED ORDER — PHENYLEPHRINE 40 MCG/ML (10ML) SYRINGE FOR IV PUSH (FOR BLOOD PRESSURE SUPPORT)
PREFILLED_SYRINGE | INTRAVENOUS | Status: DC | PRN
  Administered 2020-12-18: 40 ug via INTRAVENOUS
  Administered 2020-12-18: 80 ug via INTRAVENOUS

## 2020-12-18 MED ORDER — LATANOPROST 0.005 % OP SOLN
1.0000 [drp] | Freq: Every day | OPHTHALMIC | Status: DC
  Administered 2020-12-18 – 2020-12-23 (×6): 1 [drp] via OPHTHALMIC
  Filled 2020-12-18: qty 2.5

## 2020-12-18 MED ORDER — PANTOPRAZOLE SODIUM 40 MG PO TBEC
40.0000 mg | DELAYED_RELEASE_TABLET | Freq: Every day | ORAL | Status: DC
  Administered 2020-12-19 – 2020-12-24 (×6): 40 mg via ORAL
  Filled 2020-12-18 (×6): qty 1

## 2020-12-18 MED ORDER — EPINEPHRINE HCL 5 MG/250ML IV SOLN IN NS
0.5000 ug/min | INTRAVENOUS | Status: AC
  Administered 2020-12-18: 13:00:00 2 ug/min via INTRAVENOUS
  Filled 2020-12-18: qty 250

## 2020-12-18 MED ORDER — TAMSULOSIN HCL 0.4 MG PO CAPS
0.4000 mg | ORAL_CAPSULE | Freq: Every day | ORAL | Status: DC
  Administered 2020-12-19 – 2020-12-24 (×6): 0.4 mg via ORAL
  Filled 2020-12-18 (×6): qty 1

## 2020-12-18 MED ORDER — DIGOXIN 125 MCG PO TABS: 0.1250 mg | ORAL_TABLET | Freq: Every day | ORAL | Status: DC

## 2020-12-18 MED ORDER — SENNOSIDES-DOCUSATE SODIUM 8.6-50 MG PO TABS
1.0000 | ORAL_TABLET | Freq: Every day | ORAL | Status: DC
  Administered 2020-12-19 – 2020-12-23 (×5): 1 via ORAL
  Filled 2020-12-18 (×6): qty 1

## 2020-12-18 MED ORDER — ACETAMINOPHEN 500 MG PO TABS
1000.0000 mg | ORAL_TABLET | Freq: Four times a day (QID) | ORAL | Status: AC
  Administered 2020-12-18 – 2020-12-23 (×10): 1000 mg via ORAL
  Filled 2020-12-18 (×11): qty 2

## 2020-12-18 MED ORDER — LIDOCAINE 2% (20 MG/ML) 5 ML SYRINGE
INTRAMUSCULAR | Status: AC
  Filled 2020-12-18: qty 10

## 2020-12-18 MED ORDER — IPRATROPIUM-ALBUTEROL 0.5-2.5 (3) MG/3ML IN SOLN
3.0000 mL | Freq: Four times a day (QID) | RESPIRATORY_TRACT | Status: DC
  Administered 2020-12-18: 3 mL via RESPIRATORY_TRACT
  Filled 2020-12-18: qty 3

## 2020-12-18 MED ORDER — SODIUM CHLORIDE 0.9 % IV SOLN: INTRAVENOUS | Status: DC

## 2020-12-18 SURGICAL SUPPLY — 98 items
APPLIER CLIP ROT 10 11.4 M/L (STAPLE)
BLADE CLIPPER SURG (BLADE) ×3 IMPLANT
CANISTER SUCT 3000ML PPV (MISCELLANEOUS) ×3 IMPLANT
CANNULA REDUC XI 12-8 STAPL (CANNULA) ×6
CANNULA REDUCER 12-8 DVNC XI (CANNULA) ×4 IMPLANT
CATH THORACIC 28FR (CATHETERS) IMPLANT
CATH THORACIC 28FR RT ANG (CATHETERS) IMPLANT
CATH THORACIC 36FR (CATHETERS) IMPLANT
CATH THORACIC 36FR RT ANG (CATHETERS) IMPLANT
CLIP APPLIE ROT 10 11.4 M/L (STAPLE) IMPLANT
CLIP VESOCCLUDE MED 6/CT (CLIP) IMPLANT
CNTNR URN SCR LID CUP LEK RST (MISCELLANEOUS) ×10 IMPLANT
CONN ST 1/4X3/8  BEN (MISCELLANEOUS)
CONN ST 1/4X3/8 BEN (MISCELLANEOUS) IMPLANT
CONN Y 3/8X3/8X3/8  BEN (MISCELLANEOUS)
CONN Y 3/8X3/8X3/8 BEN (MISCELLANEOUS) IMPLANT
CONT SPEC 4OZ STRL OR WHT (MISCELLANEOUS) ×15
DEFOGGER SCOPE WARMER CLEARIFY (MISCELLANEOUS) ×3 IMPLANT
DERMABOND ADVANCED (GAUZE/BANDAGES/DRESSINGS) ×1
DERMABOND ADVANCED .7 DNX12 (GAUZE/BANDAGES/DRESSINGS) ×2 IMPLANT
DRAIN CHANNEL 28F RND 3/8 FF (WOUND CARE) IMPLANT
DRAIN CHANNEL 32F RND 10.7 FF (WOUND CARE) IMPLANT
DRAPE ARM DVNC X/XI (DISPOSABLE) ×8 IMPLANT
DRAPE COLUMN DVNC XI (DISPOSABLE) ×2 IMPLANT
DRAPE CV SPLIT W-CLR ANES SCRN (DRAPES) ×3 IMPLANT
DRAPE DA VINCI XI ARM (DISPOSABLE) ×12
DRAPE DA VINCI XI COLUMN (DISPOSABLE) ×3
DRAPE INCISE IOBAN 66X45 STRL (DRAPES) IMPLANT
DRAPE ORTHO SPLIT 77X108 STRL (DRAPES) ×3
DRAPE SURG ORHT 6 SPLT 77X108 (DRAPES) ×2 IMPLANT
DRAPE WARM FLUID 44X44 (DRAPES) ×3 IMPLANT
ELECT BLADE 6.5 EXT (BLADE) IMPLANT
ELECT CAUTERY BLADE 6.4 (BLADE) ×3 IMPLANT
ELECT REM PT RETURN 9FT ADLT (ELECTROSURGICAL) ×3
ELECTRODE REM PT RTRN 9FT ADLT (ELECTROSURGICAL) ×2 IMPLANT
GAUZE SPONGE 4X4 12PLY STRL (GAUZE/BANDAGES/DRESSINGS) ×3 IMPLANT
GLOVE TRIUMPH SURG SIZE 7.5 (KITS) ×6 IMPLANT
GOWN STRL REUS W/ TWL LRG LVL3 (GOWN DISPOSABLE) ×4 IMPLANT
GOWN STRL REUS W/ TWL XL LVL3 (GOWN DISPOSABLE) ×6 IMPLANT
GOWN STRL REUS W/TWL 2XL LVL3 (GOWN DISPOSABLE) ×6 IMPLANT
GOWN STRL REUS W/TWL LRG LVL3 (GOWN DISPOSABLE) ×6
GOWN STRL REUS W/TWL XL LVL3 (GOWN DISPOSABLE) ×9
HEMOSTAT SURGICEL 2X14 (HEMOSTASIS) ×6 IMPLANT
IRRIGATION STRYKERFLOW (MISCELLANEOUS) ×2 IMPLANT
IRRIGATOR STRYKERFLOW (MISCELLANEOUS) ×3
IRRIGATOR SUCT 8 DISP DVNC XI (IRRIGATION / IRRIGATOR) IMPLANT
IRRIGATOR SUCTION 8MM XI DISP (IRRIGATION / IRRIGATOR)
KIT BASIN OR (CUSTOM PROCEDURE TRAY) ×3 IMPLANT
KIT SUCTION CATH 14FR (SUCTIONS) IMPLANT
KIT TURNOVER KIT B (KITS) ×3 IMPLANT
LOOP VESSEL SUPERMAXI WHITE (MISCELLANEOUS) IMPLANT
NEEDLE HYPO 25GX1X1/2 BEV (NEEDLE) ×3 IMPLANT
NEEDLE SPNL 18GX3.5 QUINCKE PK (NEEDLE) ×3 IMPLANT
NS IRRIG 1000ML POUR BTL (IV SOLUTION) ×3 IMPLANT
PACK CHEST (CUSTOM PROCEDURE TRAY) ×3 IMPLANT
PAD ARMBOARD 7.5X6 YLW CONV (MISCELLANEOUS) ×6 IMPLANT
PENCIL BUTTON HOLSTER BLD 10FT (ELECTRODE) ×3 IMPLANT
SCISSORS LAP 5X35 DISP (ENDOMECHANICALS) IMPLANT
SEAL CANN UNIV 5-8 DVNC XI (MISCELLANEOUS) ×4 IMPLANT
SEAL XI 5MM-8MM UNIVERSAL (MISCELLANEOUS) ×6
SEALANT PROGEL (MISCELLANEOUS) IMPLANT
SEALANT SURG COSEAL 4ML (VASCULAR PRODUCTS) IMPLANT
SEALANT SURG COSEAL 8ML (VASCULAR PRODUCTS) IMPLANT
SET TUBE SMOKE EVAC HIGH FLOW (TUBING) IMPLANT
SHEARS HARMONIC HDI 20CM (ELECTROSURGICAL) IMPLANT
SPECIMEN JAR MEDIUM (MISCELLANEOUS) IMPLANT
SPONGE INTESTINAL PEANUT (DISPOSABLE) IMPLANT
SPONGE TONSIL TAPE 1 RFD (DISPOSABLE) IMPLANT
STAPLER CANNULA SEAL DVNC XI (STAPLE) ×4 IMPLANT
STAPLER CANNULA SEAL XI (STAPLE) ×6
SUT PDS AB 3-0 SH 27 (SUTURE) IMPLANT
SUT PROLENE 4 0 RB 1 (SUTURE)
SUT PROLENE 4-0 RB1 .5 CRCL 36 (SUTURE) IMPLANT
SUT SILK  1 MH (SUTURE) ×3
SUT SILK 1 MH (SUTURE) ×2 IMPLANT
SUT SILK 1 TIES 10X30 (SUTURE) IMPLANT
SUT SILK 2 0 SH (SUTURE) IMPLANT
SUT SILK 2 0SH CR/8 30 (SUTURE) IMPLANT
SUT SILK 3 0 SH 30 (SUTURE) IMPLANT
SUT SILK 3 0SH CR/8 30 (SUTURE) IMPLANT
SUT VIC AB 1 CTX 36 (SUTURE)
SUT VIC AB 1 CTX36XBRD ANBCTR (SUTURE) IMPLANT
SUT VIC AB 2-0 CTX 36 (SUTURE) IMPLANT
SUT VIC AB 3-0 MH 27 (SUTURE) IMPLANT
SUT VIC AB 3-0 X1 27 (SUTURE) ×6 IMPLANT
SUT VICRYL 0 TIES 12 18 (SUTURE) ×3 IMPLANT
SUT VICRYL 0 UR6 27IN ABS (SUTURE) ×6 IMPLANT
SUT VICRYL 2 TP 1 (SUTURE) IMPLANT
SYR 30ML LL (SYRINGE) ×3 IMPLANT
SYSTEM RETRIEVAL ANCHOR 8 (MISCELLANEOUS) ×3 IMPLANT
SYSTEM SAHARA CHEST DRAIN ATS (WOUND CARE) ×3 IMPLANT
TAPE CLOTH 4X10 WHT NS (GAUZE/BANDAGES/DRESSINGS) ×3 IMPLANT
TAPE CLOTH SURG 4X10 WHT LF (GAUZE/BANDAGES/DRESSINGS) ×3 IMPLANT
TIP APPLICATOR SPRAY EXTEND 16 (VASCULAR PRODUCTS) IMPLANT
TOWEL GREEN STERILE (TOWEL DISPOSABLE) ×3 IMPLANT
TRAY FOLEY MTR SLVR 16FR STAT (SET/KITS/TRAYS/PACK) ×3 IMPLANT
TROCAR XCEL 12X100 BLDLESS (ENDOMECHANICALS) ×3 IMPLANT
WATER STERILE IRR 1000ML POUR (IV SOLUTION) ×3 IMPLANT

## 2020-12-18 NOTE — Op Note (Signed)
NAMEJARRET, Charles [name] MEDICAL RECORD [id number] ACCOUNT [id number] DATE OF BIRTH:1966/04/20 FACILITY: [facility] LOCATION: [facility] PHYSICIAN:Lori Liew Chaya Jan, MD  OPERATIVE REPORT  DATE OF PROCEDURE:  12/18/2020  PREOPERATIVE DIAGNOSIS:  Mediastinal adenopathy.  POSTOPERATIVE DIAGNOSIS:  Mediastinal adenopathy.  PROCEDURE:  Xi robotic-assisted left video-assisted thoracoscopy for biopsy of aortopulmonary window lymph nodes.  SURGEON:  Modesto Charon, MD  ASSISTANT:  Jadene Pierini, PA-C.  ANESTHESIA:  General.  FINDINGS:  Markedly enlarged AP window nodes.  CLINICAL NOTE:  Charles [name] is a 55 year old man with multiple medical problems and known aortopulmonary window adenopathy.  He now presents for biopsy of the mediastinal lymph nodes.  Given these were inaccessible by mediastinoscopy, he was offered the  option of an anterior mediastinotomy versus a robotic approach from the left chest.  The indications, risks, benefits, and alternatives were discussed in detail with the patient.  He elected to proceed with the robotic procedure.  OPERATIVE NOTE:  Charles [name] was brought to the operating room on 12/18/2020.  He had induction of general anesthesia and was intubated with a double-lumen endotracheal tube.  Intravenous antibiotics were administered.  A Foley catheter was placed.   Sequential compression devices were placed on the calves for DVT prophylaxis.  He was placed in a right lateral decubitus position, and the left chest was prepped and draped in the usual sterile fashion.  Single lung ventilation of the right lung was  initiated and was tolerated well through the procedure.  A timeout was performed.  A solution containing 20 mL of liposomal bupivacaine, 30 mL of 0.5% bupivacaine, and 50 mL of saline was prepared.  This was used for local anesthesia at the incision sites as well as for the intercostal nerve blocks.  An  incision was made in the eighth interspace in the mid axillary  line.  An 8 mm port was inserted.  After confirming intrapleural placement, carbon dioxide was insufflated per protocol.  A 12 mm port was placed in the eighth interspace 5 cm anterior to the  camera port.  Intercostal nerve blocks then were performed from the third to the tenth interspace by injecting 10 mL of the bupivacaine solution into a subpleural plane at each interspace.  An additional 12 mm port was placed 5 cm posterior to the  camera port in the eighth interspace and then finally an 8 mm port was placed further posteriorly for the retraction arm.  Robotic instruments were inserted.  The lung was retracted posteriorly exposing the aortopulmonary window.  The mediastinal fat overlying the lymph nodes was divided with bipolar cautery.  There were multiple enlarged nodes, and a large node was dissected out.  An attempt then was made to  dissect out a second larger node and there was some mild bleeding.  This was controlled with direct pressure and resolved.  A portion of that node was removed, and then the instrument was removed from arm 1.  An 8 mm endoscopic retrieval bag was  inserted, and the specimen was placed into that and then removed from the chest.  A small portion was sent for AFB and fungal cultures, but the majority was sent for permanent pathology and flow cytometry.  The chest was copiously irrigated with warm  saline.  The AP window was inspected and there was no bleeding.  A 28-French Blake drain was placed through the original port incision and directed to the apex along the anterior mediastinum.  The remaining ports were removed.  Dual lung ventilation was  resumed.  The 3 remaining incisions were closed with 0 Vicryl fascial sutures and 3-0 Vicryl subcuticular sutures.  The chest tube was placed to a Pleur-evac on waterseal.  The patient then was extubated in the operating room and taken to the  postanesthetic care unit in good condition.  All sponge needle and instrument counts  were correct at the end of the procedure.  IN/NUANCE  D:12/18/2020 T:12/18/2020 JOB:[id number]

## 2020-12-18 NOTE — Anesthesia Procedure Notes (Signed)
Central Venous Catheter Insertion Performed by: Albertha Ghee, MD, anesthesiologist Start/End2/08/2021 11:01 AM, 12/18/2020 11:12 AM Patient location: Pre-op. Preanesthetic checklist: patient identified, IV checked, site marked, risks and benefits discussed, surgical consent, monitors and equipment checked, pre-op evaluation, timeout performed and anesthesia consent Position: Trendelenburg Lidocaine 1% used for infiltration and patient sedated Hand hygiene performed , maximum sterile barriers used  and Seldinger technique used Catheter size: 7 Fr Central line was placed.Double lumen Procedure performed using ultrasound guided technique. Ultrasound Notes:anatomy identified, needle tip was noted to be adjacent to the nerve/plexus identified and no ultrasound evidence of intravascular and/or intraneural injection Attempts: 1 Following insertion, line sutured, dressing applied and Biopatch. Post procedure assessment: blood return through all ports, free fluid flow and no air  Patient tolerated the procedure well with no immediate complications.

## 2020-12-18 NOTE — H&P (Signed)
Chief Complaint  Patient presents with  . Mediastinal Mass    Initial surgical consult, PET 11/16, CTA 12/3    HPI: Charles [name] him for consultation regarding mediastinal adenopathy.  Charles [name] is a 55 year old man with a history of acute systolic heart failure currently compensated, bipolar disorder, schizophrenia, tobacco abuse, hypertension, dyslipidemia, obesity, insulin-dependent type 2 diabetes, and benign prostatic hypertrophy.  Back in September 2020 when he had shortness of breath.  He had a PET/CT to rule out pulmonary embolus.  There was no PE, but there was marked AP window adenopathy.  He was referred to Dr. Lorenso Courier.  He evaluated him for both the adenopathy and possible coagulation abnormality with a history of an elevated PTT.  He was admitted to the hospital early December with acute systolic heart failure.  Catheterization showed no CAD.  He did have markedly impaired left-ventricular function with ejection fraction of approximately 20%.  He was managed medically.  He had multiple ED visits in the past 2 weeks due to both medical and mental health issues.  He denies any chest pain, pressure, or tightness.  He denies shortness of breath.  He does complain of anxiety, depression, and nervousness.  He does bruise easily.  He complains of frequent urination since starting diuretics.  His appetite is normal.  He has lost about 16 pounds in the past 2-1/2 months with diuretics.  He smokes about one fourth of a pack of cigarettes daily.  Zubrod Score: At the time of surgery this patient's most appropriate activity status/level should be described as: [] ?    0    Normal activity, no symptoms [x] ?    1    Restricted in physical strenuous activity but ambulatory, able to do out light work [] ?    2    Ambulatory and capable of self care, unable to do work activities, up and about >50 % of waking hours                              [] ?    3    Only limited self care, in bed greater  than 50% of waking hours [] ?    4    Completely disabled, no self care, confined to bed or chair [] ?    5    Moribund      Past Medical History:  Diagnosis Date  . Bipolar 1 disorder ([place])   . DM2 (diabetes mellitus, type 2) ([place])   . HTN (hypertension)          Past Surgical History:  Procedure Laterality Date  . RIGHT/LEFT HEART CATH AND CORONARY ANGIOGRAPHY N/A 10/14/2020   Procedure: RIGHT/LEFT HEART CATH AND CORONARY ANGIOGRAPHY;  Surgeon: Jettie Booze, MD;  Location: [facility];  Service: Cardiovascular;  Laterality: N/A;  . SPINAL FIXATION SURGERY  02/26/2020         Family History  Problem Relation Age of Onset  . Hypertension Mother   . Hyperlipidemia Mother   . Stroke Mother   . Cancer Mother   . Hypertension Father   . Hyperlipidemia Father   . Healthy Sister     Social History Social History        Tobacco Use  . Smoking status: Current Every Day Smoker    Packs/day: 0.25    Types: Cigarettes  . Smokeless tobacco: Never Used  Vaping Use  . Vaping Use: Never used  Substance Use Topics  .  Alcohol use: Never  . Drug use: Not Currently    Types: Cocaine          Current Outpatient Medications  Medication Sig Dispense Refill  . atorvastatin (LIPITOR) 10 MG tablet Take 10 mg by mouth daily.    . blood glucose meter kit and supplies KIT Dispense based on patient and insurance preference. Use up to four times daily as directed. (FOR ICD-9 250.00, 250.01). 1 each 0  . brimonidine (ALPHAGAN) 0.2 % ophthalmic solution Place 1 drop into both eyes 2 (two) times daily.     . cyclobenzaprine (FLEXERIL) 10 MG tablet Take 10 mg by mouth 3 (three) times daily.    . dapagliflozin propanediol (FARXIGA) 10 MG TABS tablet Take 1 tablet (10 mg total) by mouth daily. 30 tablet 3  . digoxin (LANOXIN) 0.125 MG tablet Take 1 tablet (0.125 mg total) by mouth daily. 30 tablet 0  . furosemide (LASIX) 40 MG tablet Take 1 tablet  (40 mg total) by mouth daily. 30 tablet 3  . insulin detemir (LEVEMIR) 100 UNIT/ML FlexPen Inject 25 Units into the skin daily. 15 mL 11  . Insulin Pen Needle (PEN NEEDLES 29GX1/2") 29G X 12MM MISC 1 each by Does not apply route daily. 100 each 0  . latanoprost (XALATAN) 0.005 % ophthalmic solution Place 1 drop into both eyes at bedtime.     Marland Kitchen levothyroxine (SYNTHROID) 100 MCG tablet Take 100 mcg by mouth daily before breakfast.    . melatonin 3 MG TABS tablet Take 3 mg by mouth at bedtime.    . metoprolol succinate (TOPROL-XL) 50 MG 24 hr tablet Take 1 tablet (50 mg total) by mouth at bedtime. Take with or immediately following a meal. 30 tablet 0  . Paliperidone Palmitate (INVEGA SUSTENNA IM) Inject 1 pen into the muscle every 30 (thirty) days.    . polyethylene glycol (MIRALAX / GLYCOLAX) 17 g packet Take 17 g by mouth 2 (two) times daily. 14 each 0  . risperiDONE (RISPERDAL) 0.5 MG tablet Take 0.5 mg by mouth at bedtime.    . sacubitril-valsartan (ENTRESTO) 24-26 MG Take 1 tablet by mouth 2 (two) times daily. 60 tablet 0  . senna (SENOKOT) 8.6 MG TABS tablet Take 2 tablets (17.2 mg total) by mouth 2 (two) times daily. 120 tablet 0  . spironolactone (ALDACTONE) 25 MG tablet Take 1 tablet (25 mg total) by mouth daily. 30 tablet 0  . tamsulosin (FLOMAX) 0.4 MG CAPS capsule Take 0.4 mg by mouth daily.     No current facility-administered medications for this visit.        Allergies  Allergen Reactions  . Ibuprofen   . Penicillins Rash    Review of Systems  Constitutional: Positive for unexpected weight change. Negative for activity change, appetite change, diaphoresis and fever.  HENT: Negative for trouble swallowing and voice change.   Cardiovascular: Positive for leg swelling (Resolved with diuretics). Negative for chest pain.  Gastrointestinal: Negative for abdominal pain.  Genitourinary: Positive for frequency and urgency.  Musculoskeletal: Positive for  arthralgias, back pain and joint swelling.  Neurological: Negative for seizures and syncope.  Hematological: Positive for adenopathy. Bruises/bleeds easily.  All other systems reviewed and are negative.   BP 112/71 (BP Location: Right Arm, Patient Position: Sitting)   Pulse (!) 122   Temp (!) 97 F (36.1 C)   Resp 20   Ht 5' 9"  (1.753 m)   Wt 267 lb (121.1 kg)   SpO2 94% Comment: RA with  mask on  BMI 39.43 kg/m  Physical Exam Vitals reviewed.  Constitutional:      General: He is not in acute distress.    Appearance: He is obese.  HENT:     Head: Normocephalic and atraumatic.  Eyes:     General: No scleral icterus.    Extraocular Movements: Extraocular movements intact.  Cardiovascular:     Rate and Rhythm: Regular rhythm. Tachycardia present.     Heart sounds: Normal heart sounds. No murmur heard.   Pulmonary:     Effort: Pulmonary effort is normal. No respiratory distress.     Breath sounds: Normal breath sounds.  Abdominal:     General: There is no distension.     Palpations: Abdomen is soft.  Lymphadenopathy:     Cervical: No cervical adenopathy.  Skin:    General: Skin is warm and dry.  Neurological:     General: No focal deficit present.     Mental Status: He is alert and oriented to person, place, and time.     Cranial Nerves: No cranial nerve deficit.     Motor: No weakness.    Diagnostic Tests: NUCLEAR MEDICINE PET SKULL BASE TO THIGH  TECHNIQUE: 14.1 mCi F-18 FDG was injected intravenously. Full-ring PET imaging was performed from the skull base to thigh after the radiotracer. CT data was obtained and used for attenuation correction and anatomic localization.  Fasting blood glucose: 123 mg/dl  COMPARISON: CT scans from 07/18/2020  FINDINGS: Mediastinal blood pool activity: SUV max 3.3  Liver activity: SUV max 3.9  NECK: There is misregistration of the PET data and the CT data in the neck region. I have carefully scrutinized the non  attenuation corrected data as well.  Relatively symmetric palatine tonsillar activity and glottic activity noted. This is likely physiologic. No hypermetabolic adenopathy is identified in the neck  Incidental CT findings: none  CHEST: Clustered prevascular lymph nodes are present, the largest 2.4 cm in short axis on image 69 of series 4 (stable from prior CT) with maximum SUV 6.4. Other thoracic lymph nodes are not pathologically enlarged or substantially hypermetabolic.  Incidental CT findings: Mild atherosclerotic calcification of the aortic arch.  ABDOMEN/PELVIS: No splenomegaly or abnormal splenic activity. Physiologic activity in bowel.  Incidental CT findings: Hepatic steatosis. Cholecystectomy. 4 mm nonobstructive left kidney lower pole renal calculus. Postoperative findings along the anterior superior prostate. Prostatomegaly.  SKELETON: No significant abnormal hypermetabolic activity in this region.  Incidental CT findings: Remote posterior decompression in the thoracic spine at the T5 and T6 levels.  IMPRESSION: 1. Prevascular adenopathy with individual nodes measuring up to 2.4 cm in diameter, maximum SUV 6.4 ([place] 4 activity). 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 No adenopathy or suspicious hypermetabolic activity in the neck, abdomen/pelvis, or skeleton. 2. Hepatic steatosis. 3. Nonobstructive left nephrolithiasis. 4. Prostatomegaly. 5. Aortic Atherosclerosis (ICD10-I70.0).   Electronically Signed By: Van Clines M.D. On: 09/23/2020 11:59 CT ANGIOGRAPHY CHEST WITH CONTRAST  TECHNIQUE: Multidetector CT imaging of the chest was performed using the standard protocol during bolus administration of intravenous contrast. Multiplanar CT image reconstructions and MIPs were obtained to evaluate the vascular anatomy.  CONTRAST: 138m OMNIPAQUE IOHEXOL 350 MG/ML SOLN  COMPARISON:  07/18/2020  FINDINGS: Cardiovascular: Contrast injection is sufficient to demonstrate satisfactory opacification of the pulmonary arteries to the segmental level. There is no pulmonary embolus or evidence of right heart strain. The size of the main pulmonary artery is normal. Moderate cardiomegaly. The course and caliber of the  aorta are normal. There is no atherosclerotic calcification. Opacification decreased due to pulmonary arterial phase contrast bolus timing.  Mediastinum/Nodes:  --again noted is mediastinal adenopathy which has increased in size from prior study. For example the dominant lymph node now measures 2.5 cm (previously measuring approximately 2.1 cm.  -- No hilar lymphadenopathy.  -- No axillary lymphadenopathy.  -- No supraclavicular lymphadenopathy.  -- Normal thyroid gland where visualized.  - Unremarkable esophagus.  Lungs/Pleura: Airways are patent. No pleural effusion, lobar consolidation, pneumothorax or pulmonary infarction.  Upper Abdomen: Contrast bolus timing is not optimized for evaluation of the abdominal organs. The visualized portions of the organs of the upper abdomen are normal.  Musculoskeletal: No chest wall abnormality. No bony spinal canal stenosis. Stable postsurgical changes are noted of the midthoracic spine.  Review of the MIP images confirms the above findings.  IMPRESSION: 1. No evidence of acute pulmonary embolus. 2. Moderate cardiomegaly. 3. Worsening mediastinal adenopathy as previously evaluated on the patient's PET-CT.   Electronically Signed By: Constance Holster M.D. On: 10/10/2020 23:01 I personally reviewed the CT and PET/CT images and concur with the findings noted above  Impression: Charles [name] is a 55 year old man with a history of acute systolic heart failure currently compensated, bipolar disorder, schizophrenia, tobacco abuse, hypertension, dyslipidemia, obesity, insulin-dependent  type 2 diabetes, and benign prostatic hypertrophy.  He does actively smoke currently 4 to 6 cigarettes daily.  He was first found to have AP window adenopathy back in September.  A PET/CT showed the AP window lymph nodes to be markedly hypermetabolic.  There were no other suspicious areas.  Recently he was hospitalized again with heart failure and a repeat CT showed the nodes had increased further in size.  Differential diagnosis includes infectious and inflammatory adenopathy, but the primary consideration is lymphoma.  In any event he needs a biopsy for diagnostic purposes.  This is not an area that is favorable for CT-guided biopsy due to the proximity of the great vessels.  It is not accessible with cervical mediastinoscopy.  Options include an anterior mediastinal anatomy/Chamberlain procedure and a robotic assisted thoracoscopy.  I think in his case a left RATS would be the best option.  I described the proposed surgical procedure to him.  He understands this would be done under general anesthesia.  We would plan to keep him overnight.  He would have a drainage tube in postoperatively.  He understands will be multiple small incisions.  I informed her of the indications, risks, benefits, and alternatives.  He understands the risks include, but not limited to death, MI, DVT, PE, bleeding, possible need for transfusion, infection, air leak, cardiac arrhythmias, as well as possibility of other unforeseeable complications.  He accepts the risk and agrees to proceed  Plan: Robotic left VATS for biopsy of AP window lymph nodes on 11/13/2020  Melrose Nakayama, MD [facility] [phone]        Electronically signed by Melrose Nakayama, MD at 11/04/2020 2:29 PM   In interim since this H&P, surgery scheduled for 11/13/2020 was cancelled due to + preop COVID test. He was completely asymptomatic, "didn't even know I had it."  Plan to proceed with Left Robotic  VATS for lymph node biopsy  Remo Lipps C. Roxan Hockey, MD [facility] [phone]

## 2020-12-18 NOTE — Anesthesia Procedure Notes (Signed)
Procedure Name: Intubation Date/Time: 12/18/2020 11:37 AM Performed by: Imagene Riches, CRNA Pre-anesthesia Checklist: Patient identified, Emergency Drugs available, Suction available and Patient being monitored Patient Re-evaluated:Patient Re-evaluated prior to induction Oxygen Delivery Method: Circle System Utilized Preoxygenation: Pre-oxygenation with 100% oxygen Induction Type: IV induction Ventilation: Mask ventilation without difficulty Laryngoscope Size: Mac and 4 Grade View: Grade I Tube type: Oral Endobronchial tube: Left, Double lumen EBT and EBT position confirmed by fiberoptic bronchoscope and 37 Fr Number of attempts: 1 Airway Equipment and Method: Stylet and Oral airway Placement Confirmation: ETT inserted through vocal cords under direct vision,  positive ETCO2 and breath sounds checked- equal and bilateral Secured at: 31 cm Tube secured with: Tape Dental Injury: Teeth and Oropharynx as per pre-operative assessment

## 2020-12-18 NOTE — Brief Op Note (Signed)
12/18/2020  1:28 PM  PATIENT:  Charles [name]  55 y.o. male  PRE-OPERATIVE DIAGNOSIS:  AP WINDOW ADENOPATHY  POST-OPERATIVE DIAGNOSIS:  AP WINDOW ADENOPATHY  PROCEDURE:  Procedure(s): XI ROBOTIC ASSISTED THORASCOPY FOR BIOPSY AP WINDOW LYMPH NODES (Left) LYMPH NODE BIOPSY (Left)  SURGEON:  Surgeon(s) and Role:    * Melrose Nakayama, MD - Primary  PHYSICIAN ASSISTANT: Xerxes Agrusa PA-C  ANESTHESIA:   general  EBL:  MINIMAL   BLOOD ADMINISTERED:none  DRAINS: (1 58 F) Blake drain(s) in the LEFT HEMITHORAX   LOCAL MEDICATIONS USED:  EXPAREL  SPECIMEN:  Source of Specimen:  LYMPH NODES  DISPOSITION OF SPECIMEN:  PATH AND MICRO FOR AFB  COUNTS:  YES  TOURNIQUET:  * No tourniquets in log *  DICTATION: .Other Dictation: Dictation Number [id number]  PLAN OF CARE: Admit to inpatient   PATIENT DISPOSITION:  PACU - hemodynamically stable.   Delay start of Pharmacological VTE agent (>24hrs) due to surgical blood loss or risk of bleeding: yes

## 2020-12-18 NOTE — Discharge Instructions (Signed)
[facility] OFFICE NUMBER [phone]   Robot-Assisted Thoracic Surgery, Care After The following information offers guidance on how to care for yourself after your procedure. Your health care provider may also give you more specific instructions. If you have problems or questions, contact your health care provider. What can I expect after the procedure? After the procedure, it is common to have:  Some pain and aches in the area of your surgical incisions.  Pain when breathing in (inhaling) and coughing.  Tiredness (fatigue).  Trouble sleeping.  Constipation. Follow these instructions at home: Medicines  Take over-the-counter and prescription medicines only as told by your health care provider.  If you were prescribed an antibiotic medicine, take it as told by your health care provider. Do not stop taking the antibiotic even if you start to feel better.  Talk with your health care provider about safe and effective ways to manage pain after your procedure. Pain management should fit your specific health needs.  Take pain medicine before pain becomes severe. Relieving and controlling your pain will make breathing easier for you.  Ask your health care provider if the medicine prescribed to you requires you to avoid driving or using machinery. Eating and drinking Follow instructions from your health care provider about eating or drinking restrictions. These will vary depending on what procedure you had. Your health care provider may recommend:  A liquid diet or soft diet for the first few days.  Meals that are smaller and more frequent.  A diet of fruits, vegetables, whole grains, and low-fat proteins.  Limiting foods that are high in fat and processed sugar, including fried or sweet foods. Incision care  Follow instructions from your health care provider about how to take care of your incisions. Make sure you: ? Wash your hands with soap and water for at least 20 seconds before and  after you change your bandage (dressing). If soap and water are not available, use hand sanitizer. ? Change your dressing as told by your health care provider. ? Leave stitches (sutures), skin glue, or adhesive strips in place. These skin closures may need to stay in place for 2 weeks or longer. If adhesive strip edges start to loosen and curl up, you may trim the loose edges. Do not remove adhesive strips completely unless your health care provider tells you to do that.  Check your incision area every day for signs of infection. Check for: ? Redness, swelling, or more pain. ? Fluid or blood. ? Warmth. ? Pus or a bad smell. Activity  Return to your normal activities as told by your health care provider. Ask your health care provider what activities are safe for you.  Ask your health care provider when it is safe for you to drive.  Do not lift anything that is heavier than 10 lb (4.5 kg), or the limit that you are told, until your health care provider says that it is safe.  Rest as told by your health care provider.  Avoid sitting for a long time without moving. Get up to take short walks every 1-2 hours. This is important to improve blood flow and breathing. Ask for help if you feel weak or unsteady.  Do exercises as told by your health care provider. Pneumonia prevention  Do deep breathing exercises and cough regularly as directed. This helps clear mucus and opens your lungs. Doing this helps prevent lung infection (pneumonia).  If you were given an incentive spirometer, use it as told. An  incentive spirometer is a tool that measures how well you are filling your lungs with each breath.  Coughing may hurt less if you try to support your chest. This is called splinting. Try one of these when you cough: ? Hold a pillow against your chest. ? Place the palms of both hands on top of your incision area.  Do not use any products that contain nicotine or tobacco. These products include  cigarettes, chewing tobacco, and vaping devices, such as e-cigarettes. If you need help quitting, ask your health care provider.  Avoid secondhand smoke.   General instructions  If you have a drainage tube: ? Follow instructions from your health care provider about how to take care of it. ? Do not travel by airplane after your tube is removed until your health care provider tells you it is safe.  You may need to take these actions to prevent or treat constipation: ? Drink enough fluid to keep your urine pale yellow. ? Take over-the-counter or prescription medicines. ? Eat foods that are high in fiber, such as beans, whole grains, and fresh fruits and vegetables. ? Limit foods that are high in fat and processed sugars, such as fried or sweet foods.  Keep all follow-up visits. This is important. Contact a health care provider if:  You have redness, swelling, or more pain around an incision.  You have fluid or blood coming from an incision.  An incision feels warm to the touch.  You have pus or a bad smell coming from an incision.  You have a fever.  You cannot eat or drink without vomiting.  Your pain medicine is not controlling your pain. Get help right away if:  You have chest pain.  Your heart is beating quickly.  You have trouble breathing.  You have trouble speaking.  You are confused.  You feel weak or dizzy, or you faint. These symptoms may represent a serious problem that is an emergency. Do not wait to see if the symptoms will go away. Get medical help right away. Call your local emergency services (911 in the [place] Do not drive yourself to the hospital. Summary  Talk with your health care provider about safe and effective ways to manage pain after your procedure. Pain management should fit your specific health needs.  Return to your normal activities as told by your health care provider. Ask your health care provider what activities are safe for you.  Do  deep breathing exercises and cough regularly as directed. This helps to clear mucus and prevent pneumonia. If it hurts to cough, ease pain by holding a pillow against your chest or by placing the palms of both hands over your incisions. This information is not intended to replace advice given to you by your health care provider. Make sure you discuss any questions you have with your health care provider. Document Revised: 07/18/2020 Document Reviewed: 07/18/2020 Elsevier Patient Education  2021 [employer].

## 2020-12-18 NOTE — Anesthesia Procedure Notes (Signed)
Arterial Line Insertion Start/End2/08/2021 11:00 AM, 12/18/2020 11:15 AM Performed by: Albertha Ghee, MD, anesthesiologist  Preanesthetic checklist: patient identified, IV checked, site marked, risks and benefits discussed, surgical consent, monitors and equipment checked, pre-op evaluation, timeout performed and anesthesia consent Patient sedated Left, radial was placed Catheter size: 20 G Hand hygiene performed , maximum sterile barriers used  and Seldinger technique used Allen's test indicative of satisfactory collateral circulation Attempts: 1 Procedure performed using ultrasound guided technique. Ultrasound Notes:anatomy identified, needle tip was noted to be adjacent to the nerve/plexus identified and no ultrasound evidence of intravascular and/or intraneural injection Following insertion, dressing applied and Biopatch. Post procedure assessment: normal  Patient tolerated the procedure well with no immediate complications.

## 2020-12-18 NOTE — Discharge Summary (Addendum)
Physician Discharge Summary  Patient ID: Charles [name] MRN: [id number] DOB/AGE: November 23, 1965 55 y.o.  Admit date: 12/18/2020 Discharge date: 12/24/2020  Admission Diagnoses: Mediastinal adenopathy  Discharge Diagnoses:  Active Problems:   Mediastinal adenopathy   Pulmonary edema   Patient Active Problem List   Diagnosis Date Noted  . Pulmonary edema   . Mediastinal adenopathy 12/18/2020  . Adjustment disorder with mixed anxiety and depressed mood 10/30/2020  . Acute systolic heart failure ([place])   . Mediastinal lymphadenopathy 10/11/2020  . Shortness of breath 10/11/2020  . Sinus tachycardia 10/11/2020  . Night sweats 10/11/2020  . DM2 (diabetes mellitus, type 2) ([place]) 10/11/2020  . Lymphoma ([place]) 10/11/2020  . Hypertension 09/25/2020  . BPH (benign prostatic hyperplasia) 09/25/2020  . Insomnia 09/25/2020     HPI:Mr. Charles [name] him for consultation regarding mediastinal adenopathy.  Charles [name] is a 55 year old man with a history of acute systolic heart failure currently compensated, bipolar disorder, schizophrenia, tobacco abuse, hypertension, dyslipidemia, obesity, insulin-dependent type 2 diabetes, and benign prostatic hypertrophy.Back in September 2020 when he had shortness of breath. He had a PET/CT to rule out pulmonary embolus. There was no PE, but there was marked AP window adenopathy.  He was referred to Dr. Lorenso Courier. He evaluated him for both the adenopathy and possible coagulation abnormality with a history of an elevated PTT.  He was admitted to the hospital early December with acute systolic heart failure. Catheterization showed no CAD. He did have markedly impaired left-ventricular function with ejection fraction of approximately 20%. He was managed medically.  He had multiple ED visits in the past 2 weeks due to both medical and mental health issues.  He denies any chest pain, pressure, or tightness. He denies shortness of breath. He does complain of anxiety,  depression, and nervousness. He does bruise easily. He complains of frequent urination since starting diuretics. His appetite is normal. He has lost about 16 pounds in the past 2-1/2 months with diuretics. He smokes about one fourth of a pack of cigarettes daily.  The patient and all relevant studies were reviewed by Dr. Roxan Hockey who recommended biopsy of the AP window lymphadenopathy and he was admitted electively for the procedure.   Discharged Condition: good  Hospital Course: The patient was admitted electively and on 12/18/2020 taken the operating room where he underwent the below described procedure.  He tolerated it well was taken to the postanesthesia care unit in stable condition.   Postoperative hospital course:  Hospital Course: Chest tube was removed on post op day one. Because he was on multiple medications for heart failure, a cardiology consult was obtained. He then developed mid sternal chest pain (tender to palpation) as well as some shortness of breath. He had been on IVF and chest x ray done 02/12 showed pulmonary vascular congestion. IVF were stopped and he was given IV Lasix. BMET was obtained and potassium and Magnesium were WNL. He was already on Toprol XL and Entresto. He was given additional IV Lasix with good diuresis. He was started on Farxiga. His wounds were clean, dry and healing without signs of infection. Oxygen has been weaned off.  The patients creatinine is currently 1.09  It was felt his overall fluid status was improving and he is currently euvolemic. He was transitioned to oral Lasix on 12/23/2020.  His Toprol XL was titrated to 150 mg daily but he had some dizziness and hypotension. On 2/15 his lasix was placed on hold when BP dropped into 70-80 range and he got  tachycardic with ambulation. His blood pressure and heart rate  improved over the next 24 hours and he resumed walking without dizziness or shortness of breath. Arrangements have been made for patient  to return to [facility].   Consults: cardiology  Significant Diagnostic Studies: routine post-op labs and CXR's  Treatments: surgery:    OPERATIVE REPORT  DATE OF PROCEDURE:  12/18/2020  PREOPERATIVE DIAGNOSIS:  Mediastinal adenopathy.  POSTOPERATIVE DIAGNOSIS:  Mediastinal adenopathy.  PROCEDURE:  Xi robotic-assisted left video-assisted thoracoscopy for biopsy of aortopulmonary window lymph nodes.  SURGEON:  Modesto Charon, MD  ASSISTANT:  Jadene Pierini.  ANESTHESIA:  General.  FINDINGS:  Markedly enlarged AP window nodes.   Pathology: pending  Discharge Exam: Blood pressure 117/87, pulse (!) 109, temperature 98.9 F (37.2 C), temperature source Oral, resp. rate (!) 31, height 5' 9"  (1.753 m), weight 111.7 kg, SpO2 95 %.   General appearance: alert, cooperative and no distress Heart: regular rate and rhythm Lungs: dim L>R lower fields Abdomen: benign Extremities: no edema Wound: incis healing well  Disposition: Discharge disposition: [place] Not Defined       Discharge Instructions    Discharge patient   Complete by: As directed    Assisted living facility   Discharge disposition: [place] Not Defined   Discharge patient date: 12/23/2020     Allergies as of 12/24/2020      Reactions   Ibuprofen    Penicillins Rash      Medication List    STOP taking these medications   digoxin 0.125 MG tablet Commonly known as: LANOXIN   furosemide 40 MG tablet Commonly known as: LASIX   spironolactone 25 MG tablet Commonly known as: ALDACTONE     TAKE these medications   acetaminophen 325 MG tablet Commonly known as: TYLENOL Take 650 mg by mouth every 6 (six) hours as needed for moderate pain.   atorvastatin 10 MG tablet Commonly known as: LIPITOR Take 10 mg by mouth daily.   blood glucose meter kit and supplies Kit Dispense based on patient and insurance  preference. Use up to four times daily as directed. (FOR ICD-9 250.00, 250.01).   brimonidine 0.2 % ophthalmic solution Commonly known as: ALPHAGAN Place 1 drop into both eyes 2 (two) times daily.   cyclobenzaprine 10 MG tablet Commonly known as: FLEXERIL Take 10 mg by mouth 3 (three) times daily.   dapagliflozin propanediol 10 MG Tabs tablet Commonly known as: FARXIGA Take 1 tablet (10 mg total) by mouth daily.   divalproex 500 MG DR tablet Commonly known as: DEPAKOTE Take 1 tablet (500 mg total) by mouth 2 (two) times daily.   insulin detemir 100 UNIT/ML FlexPen Commonly known as: LEVEMIR Inject 25 Units into the skin daily.   INVEGA SUSTENNA IM Inject 1 pen into the muscle every 30 (thirty) days.   latanoprost 0.005 % ophthalmic solution Commonly known as: XALATAN Place 1 drop into both eyes at bedtime.   levothyroxine 100 MCG tablet Commonly known as: SYNTHROID Take 100 mcg by mouth daily before breakfast.   melatonin 3 MG Tabs tablet Take 3 mg by mouth at bedtime.   metoprolol succinate 100 MG 24 hr tablet Commonly known as: TOPROL-XL Take 1 tablet (100 mg total) by mouth daily. Take with or immediately following a meal. Start taking on: December 25, 2020 What changed: Another medication with the same name was removed. Continue taking this medication, and follow the directions you see here.  PEN NEEDLES 29GX1/2" 29G X 12MM Misc 1 each by Does not apply route daily.   polyethylene glycol 17 g packet Commonly known as: MIRALAX / GLYCOLAX Take 17 g by mouth 2 (two) times daily.   potassium chloride SA 20 MEQ tablet Commonly known as: KLOR-CON Take 2 tablets (40 mEq total) by mouth daily.   risperiDONE 0.5 MG tablet Commonly known as: RISPERDAL Take 1 tablet (0.5 mg total) by mouth at bedtime.   sacubitril-valsartan 24-26 MG Commonly known as: ENTRESTO Take 1 tablet by mouth 2 (two) times daily.   senna 8.6 MG Tabs tablet Commonly known as:  SENOKOT Take 2 tablets (17.2 mg total) by mouth 2 (two) times daily.   sertraline 25 MG tablet Commonly known as: ZOLOFT Take 1 tablet (25 mg total) by mouth daily.   tamsulosin 0.4 MG Caps capsule Commonly known as: FLOMAX Take 0.4 mg by mouth daily.   traMADol 50 MG tablet Commonly known as: ULTRAM Take 1-2 tablets (50-100 mg total) by mouth every 12 (twelve) hours as needed (mild pain).        Signed: Antony Odea PA-C 12/24/2020, 11:18 AM

## 2020-12-18 NOTE — Discharge Summary (Incomplete Revision)
Physician Discharge Summary  Patient ID: Charles [name] MRN: [id number] DOB/AGE: 1966-01-31 55 y.o.  Admit date: 12/18/2020 Discharge date: 12/23/2020  Admission Diagnoses: Mediastinal adenopathy  Discharge Diagnoses:  Active Problems:   Mediastinal adenopathy   Patient Active Problem List   Diagnosis Date Noted  . Mediastinal adenopathy 12/18/2020  . Adjustment disorder with mixed anxiety and depressed mood 10/30/2020  . Acute systolic heart failure ([place])   . Mediastinal lymphadenopathy 10/11/2020  . Shortness of breath 10/11/2020  . Sinus tachycardia 10/11/2020  . Night sweats 10/11/2020  . DM2 (diabetes mellitus, type 2) ([place]) 10/11/2020  . Lymphoma ([place]) 10/11/2020  . Hypertension 09/25/2020  . BPH (benign prostatic hyperplasia) 09/25/2020  . Insomnia 09/25/2020     HPI:Mr. Falletta him for consultation regarding mediastinal adenopathy.  Charles [name] is a 55 year old man with a history of acute systolic heart failure currently compensated, bipolar disorder, schizophrenia, tobacco abuse, hypertension, dyslipidemia, obesity, insulin-dependent type 2 diabetes, and benign prostatic hypertrophy.Back in September 2020 when he had shortness of breath. He had a PET/CT to rule out pulmonary embolus. There was no PE, but there was marked AP window adenopathy.  He was referred to Dr. Lorenso Courier. He evaluated him for both the adenopathy and possible coagulation abnormality with a history of an elevated PTT.  He was admitted to the hospital early December with acute systolic heart failure. Catheterization showed no CAD. He did have markedly impaired left-ventricular function with ejection fraction of approximately 20%. He was managed medically.  He had multiple ED visits in the past 2 weeks due to both medical and mental health issues.  He denies any chest pain, pressure, or tightness. He denies shortness of breath. He does complain of anxiety, depression, and nervousness. He does  bruise easily. He complains of frequent urination since starting diuretics. His appetite is normal. He has lost about 16 pounds in the past 2-1/2 months with diuretics. He smokes about one fourth of a pack of cigarettes daily.  The patient and all relevant studies were reviewed by Dr. Roxan Hockey who recommended biopsy of the AP window lymphadenopathy and he was admitted electively for the procedure.   Discharged Condition: good  Hospital Course: The patient was admitted electively and on 12/18/2020 taken the operating room where he underwent the below described procedure.  He tolerated it well was taken to the postanesthesia care unit in stable condition.   Postoperative hospital course:  Hospital Course: Chest tube was removed on post op day one. Because he was on multiple medications for heart failure, a cardiology consult was obtained. He then developed mid sternal chest pain (tender to palpation) as well as some shortness of breath. He had been on IVF and chest x ray done 02/12 showed pulmonary vascular congestion. IVF were stopped and he was given IV Lasix. BMET was obtained and potassium and Magnesium were WNL. He was already on Toprol XL and Entresto. He was given additional IV Lasix with good diuresis. He was started on Farxiga. His wounds were clean, dry and healing without signs of infection. Oxygen has been weaned off.  The patients creatinine is currently 1.09  It was felt his overall fluid status was improving and he is currently euvolemic. He was transitioned to oral Lasix on 12/23/2020.  His Toprol XL was titrated to 150 mg daily. On 2/15 his lasix was placed on hold when BP dropped into 70-80 range and he got tachycardic with ambulation.  Arrangements have been made for patient to return to [facility].   Consults: cardiology  Significant Diagnostic Studies: routine post-op labs and CXR's  Treatments: surgery:    OPERATIVE REPORT  DATE OF PROCEDURE:   12/18/2020  PREOPERATIVE DIAGNOSIS:  Mediastinal adenopathy.  POSTOPERATIVE DIAGNOSIS:  Mediastinal adenopathy.  PROCEDURE:  Xi robotic-assisted left video-assisted thoracoscopy for biopsy of aortopulmonary window lymph nodes.  SURGEON:  Modesto Charon, MD  ASSISTANT:  Jadene Pierini.  ANESTHESIA:  General.  FINDINGS:  Markedly enlarged AP window nodes.   Pathology: pending  Discharge Exam: Blood pressure 99/60, pulse (!) 106, temperature 98.2 F (36.8 C), temperature source Oral, resp. rate (!) 35, height 5' 9"  (1.753 m), weight 111.7 kg, SpO2 98 %.   General appearance: alert, cooperative and no distress Heart: regular rate and rhythm Lungs: dim L>R lower fields Abdomen: benign Extremities: no edema Wound: incis healing well  Disposition: Discharge disposition: [place] Not Defined       Discharge Instructions    Discharge patient   Complete by: As directed    Assisted living facility   Discharge disposition: [place] Not Defined   Discharge patient date: 12/23/2020     Allergies as of 12/23/2020      Reactions   Ibuprofen    Penicillins Rash      Medication List    STOP taking these medications   digoxin 0.125 MG tablet Commonly known as: LANOXIN   spironolactone 25 MG tablet Commonly known as: ALDACTONE     TAKE these medications   acetaminophen 325 MG tablet Commonly known as: TYLENOL Take 650 mg by mouth every 6 (six) hours as needed for moderate pain.   atorvastatin 10 MG tablet Commonly known as: LIPITOR Take 10 mg by mouth daily.   blood glucose meter kit and supplies Kit Dispense based on patient and insurance preference. Use up to four times daily as directed. (FOR ICD-9 250.00, 250.01).   brimonidine 0.2 % ophthalmic solution Commonly known as: ALPHAGAN Place 1 drop into both eyes 2 (two) times daily.   cyclobenzaprine 10 MG tablet Commonly known as: FLEXERIL Take 10  mg by mouth 3 (three) times daily.   dapagliflozin propanediol 10 MG Tabs tablet Commonly known as: FARXIGA Take 1 tablet (10 mg total) by mouth daily.   divalproex 500 MG DR tablet Commonly known as: DEPAKOTE Take 1 tablet (500 mg total) by mouth 2 (two) times daily.   furosemide 80 MG tablet Commonly known as: LASIX Take 1 tablet (80 mg total) by mouth 2 (two) times daily. What changed:   medication strength  how much to take  when to take this   insulin detemir 100 UNIT/ML FlexPen Commonly known as: LEVEMIR Inject 25 Units into the skin daily.   INVEGA SUSTENNA IM Inject 1 pen into the muscle every 30 (thirty) days.   latanoprost 0.005 % ophthalmic solution Commonly known as: XALATAN Place 1 drop into both eyes at bedtime.   levothyroxine 100 MCG tablet Commonly known as: SYNTHROID Take 100 mcg by mouth daily before breakfast.   melatonin 3 MG Tabs tablet Take 3 mg by mouth at bedtime.   metoprolol succinate 50 MG 24 hr tablet Commonly known as: TOPROL-XL Take 3 tablets (150 mg total) by mouth daily. Take with or immediately following a meal. Start taking on: December 24, 2020 What changed:   medication strength  how much to take  Another medication with the same name was removed. Continue taking this medication, and follow the directions you see here.  PEN NEEDLES 29GX1/2" 29G X 12MM Misc 1 each by Does not apply route daily.   polyethylene glycol 17 g packet Commonly known as: MIRALAX / GLYCOLAX Take 17 g by mouth 2 (two) times daily.   potassium chloride SA 20 MEQ tablet Commonly known as: KLOR-CON Take 2 tablets (40 mEq total) by mouth daily.   risperiDONE 0.5 MG tablet Commonly known as: RISPERDAL Take 1 tablet (0.5 mg total) by mouth at bedtime.   sacubitril-valsartan 24-26 MG Commonly known as: ENTRESTO Take 1 tablet by mouth 2 (two) times daily.   senna 8.6 MG Tabs tablet Commonly known as: SENOKOT Take 2 tablets (17.2 mg total) by  mouth 2 (two) times daily.   sertraline 25 MG tablet Commonly known as: ZOLOFT Take 1 tablet (25 mg total) by mouth daily.   tamsulosin 0.4 MG Caps capsule Commonly known as: FLOMAX Take 0.4 mg by mouth daily.   traMADol 50 MG tablet Commonly known as: ULTRAM Take 1-2 tablets (50-100 mg total) by mouth every 12 (twelve) hours as needed (mild pain).        Signed: John Giovanni PA-C 12/23/2020, 9:42 AM

## 2020-12-18 NOTE — Plan of Care (Signed)

## 2020-12-18 NOTE — Transfer of Care (Signed)
Immediate Anesthesia Transfer of Care Note  Patient: Charles [name]  Procedure(s) Performed: XI ROBOTIC ASSISTED THORASCOPY FOR BIOPSY AP WINDOW LYMPH NODES (Left Chest) LYMPH NODE BIOPSY (Left ) INTERCOSTAL NERVE BLOCK (Left Chest)  Patient Location: PACU  Anesthesia Type:General  Level of Consciousness: drowsy  Airway & Oxygen Therapy: Patient Spontanous Breathing and Patient connected to face mask oxygen  Post-op Assessment: Report given to RN and Post -op Vital signs reviewed and stable  Post vital signs: Reviewed and stable  Last Vitals:  Vitals Value Taken Time  BP 104/69 12/18/20 1347  Temp 36.8 C 12/18/20 1345  Pulse 121 12/18/20 1351  Resp 35 12/18/20 1351  SpO2 90 % 12/18/20 1351  Vitals shown include unvalidated device data.  Last Pain:  Vitals:   12/18/20 1039  TempSrc:   PainSc: 0-No pain         Complications: No complications documented.

## 2020-12-18 NOTE — Interval H&P Note (Signed)
History and Physical Interval Note:  12/18/2020 10:23 AM  Charles [name]  has presented today for surgery, with the diagnosis of AP WINDOW ADENOPATHY.  The various methods of treatment have been discussed with the patient and family. After consideration of risks, benefits and other options for treatment, the patient has consented to  Procedure(s): XI ROBOTIC ASSISTED THORASCOPY FOR BIOPSY AP WINDOW LYMPH NODES (Left) LYMPH NODE BIOPSY (Left) as a surgical intervention.  The patient's history has been reviewed, patient examined, no change in status, stable for surgery.  I have reviewed the patient's chart and labs.  Questions were answered to the patient's satisfaction.     Melrose Nakayama

## 2020-12-19 ENCOUNTER — Encounter (HOSPITAL_COMMUNITY): Payer: Self-pay | Admitting: Thoracic Surgery (Cardiothoracic Vascular Surgery)

## 2020-12-19 ENCOUNTER — Inpatient Hospital Stay (HOSPITAL_COMMUNITY): Payer: Medicaid Other

## 2020-12-19 DIAGNOSIS — I5022 Chronic systolic (congestive) heart failure: Secondary | ICD-10-CM | POA: Diagnosis not present

## 2020-12-19 DIAGNOSIS — I428 Other cardiomyopathies: Secondary | ICD-10-CM | POA: Diagnosis not present

## 2020-12-19 LAB — GLUCOSE, CAPILLARY
Glucose-Capillary: 110 mg/dL — ABNORMAL HIGH (ref 70–99)
Glucose-Capillary: 125 mg/dL — ABNORMAL HIGH (ref 70–99)
Glucose-Capillary: 118 mg/dL — ABNORMAL HIGH (ref 70–99)
Glucose-Capillary: 121 mg/dL — ABNORMAL HIGH (ref 70–99)

## 2020-12-19 LAB — CBC
HCT: 46.7 % (ref 39.0–52.0)
Hemoglobin: 14.7 g/dL (ref 13.0–17.0)
MCH: 25.3 pg — ABNORMAL LOW (ref 26.0–34.0)
MCHC: 31.5 g/dL (ref 30.0–36.0)
MCV: 80.5 fL (ref 80.0–100.0)
Platelets: 164 10*3/uL (ref 150–400)
RBC: 5.8 MIL/uL (ref 4.22–5.81)
RDW: 17.9 % — ABNORMAL HIGH (ref 11.5–15.5)
WBC: 10.7 10*3/uL — ABNORMAL HIGH (ref 4.0–10.5)
nRBC: 0 % (ref 0.0–0.2)

## 2020-12-19 LAB — BASIC METABOLIC PANEL
Anion gap: 10 (ref 5–15)
BUN: 21 mg/dL — ABNORMAL HIGH (ref 6–20)
CO2: 24 mmol/L (ref 22–32)
Calcium: 8.9 mg/dL (ref 8.9–10.3)
Chloride: 101 mmol/L (ref 98–111)
Creatinine, Ser: 1.29 mg/dL — ABNORMAL HIGH (ref 0.61–1.24)
GFR, Estimated: >60 mL/min (ref >60)
Glucose, Bld: 123 mg/dL — ABNORMAL HIGH (ref 70–99)
Potassium: 5.3 mmol/L — ABNORMAL HIGH (ref 3.5–5.1)
Sodium: 135 mmol/L (ref 135–145)

## 2020-12-19 LAB — ACID FAST SMEAR (AFB, MYCOBACTERIA): Acid Fast Smear: NEGATIVE

## 2020-12-19 MED ORDER — CHLORHEXIDINE GLUCONATE CLOTH 2 % EX PADS
6.0000 | MEDICATED_PAD | Freq: Every day | CUTANEOUS | Status: DC
  Administered 2020-12-19 – 2020-12-24 (×6): 6 via TOPICAL

## 2020-12-19 MED ORDER — FUROSEMIDE 10 MG/ML IJ SOLN
40.0000 mg | Freq: Once | INTRAMUSCULAR | Status: AC
  Administered 2020-12-19: 40 mg via INTRAVENOUS
  Filled 2020-12-19: qty 4

## 2020-12-19 MED ORDER — IPRATROPIUM-ALBUTEROL 0.5-2.5 (3) MG/3ML IN SOLN
3.0000 mL | Freq: Two times a day (BID) | RESPIRATORY_TRACT | Status: DC
  Administered 2020-12-19 (×2): 3 mL via RESPIRATORY_TRACT
  Filled 2020-12-19 (×3): qty 3

## 2020-12-19 NOTE — Consult Note (Addendum)
Cardiology Consultation:   Patient ID: Charles [name]; [id number]; 12/03/1965   Admit date: 12/18/2020 Date of Consult: 12/19/2020  Primary Care Provider: Patient, No Pcp Per Primary Cardiologist: [name], MD 10/12/2020 in-hospital Primary Electrophysiologist:  None   Patient Profile:   Charles [name] is a 55 y.o. male with a hx of NICM w/ EF 20-25% at 10/2020 admission, S-CHF, DM2, HTN, HLD, hx ETOH and cocaine abuse, bipolar d/o, paranoid schizophrenia, hypothyroid, ?lymphoma, who is being seen today for the evaluation of CHF at the request of Dr Roxan Hockey.  History of Present Illness:   Charles [name] was admitted 12/03-12/16/2021 for SOB, dx CHF w/ EF 20-25% and nl cors at cath.  Admit weight 281 pounds, D/c weight 275 pounds, Na 132, nl renal function  Pt in the ER 12/21-12/27 for suicidal ideation. Wt 123.8 kg, Na 136, K+ 4.3  Office visit 12/28 w/ Dr Roxan Hockey for mediastinal adenopathy. RATS planned. Surgery planned for January, but pt +Covid on 11/12/2020, procedure delayed.  ER visit 11/29/2020 for chest pain and cough. Trop negative. CT w/ changes c/w Covid. D/c, but w/ abnl CXR, surgery delayed again.  Weight 255 pounds  ER visit 02/04-02/06 for suicidal ideations, seen by Psych and meds adjusted.   Pt admitted 02/10 for ROBOTIC ASSISTED THORASCOPY FOR BIOPSY AP WINDOW LYMPH NODES (Left), LYMPH NODE BIOPSY (Left).  02/11, pt seen by Dr Roxan Hockey and confusion noted about CHF meds, cards asked to see.  Charles [name] states that he is compliant with his medications as they are given to him.  He states he has lost a great deal of weight, thinks about 30 pounds since his admission in December.  His weight on admission was 253 pounds.  He has not been weighed today.  He describes orthopnea and PND, but has not had either 1 since sometime in January.  He states he is breathing much better.  He does not weigh daily, says the facility does not have scales.  He still has dyspnea  on exertion, but it is much better than it has been in the past.  In general, he is pretty pleased with his respiratory status.  He has some pain at the incision site.,  Other than that, no complaints right now.    Past Medical History:  Diagnosis Date  . Arthritis    lower back  . Bipolar 1 disorder ([place])   . Cardiomyopathy, nonischemic ([place])    no CAD 10/14/20 cath, EF 20-25% by 10/11/20 echo  . CHF (congestive heart failure) ([place])   . COVID 10/2020  . Depression   . DM2 (diabetes mellitus, type 2) ([place])   . HTN (hypertension)   . Myocardial infarction [facility])     Past Surgical History:  Procedure Laterality Date  . BACK SURGERY  01/2020  . CARDIAC CATHETERIZATION  10/2020  . CHOLECYSTECTOMY    . HERNIA REPAIR    . INTERCOSTAL NERVE BLOCK Left 12/18/2020   Procedure: INTERCOSTAL NERVE BLOCK;  Surgeon: Melrose Nakayama, MD;  Location: [facility];  Service: Thoracic;  Laterality: Left;  . LYMPH NODE BIOPSY Left 12/18/2020   Procedure: LYMPH NODE BIOPSY;  Surgeon: Melrose Nakayama, MD;  Location: [place] [facility];  Service: Thoracic;  Laterality: Left;  . RIGHT/LEFT HEART CATH AND CORONARY ANGIOGRAPHY N/A 10/14/2020   Procedure: RIGHT/LEFT HEART CATH AND CORONARY ANGIOGRAPHY;  Surgeon: Jettie Booze, MD;  Location: [facility];  Service: Cardiovascular;  Laterality: N/A;  . SPINAL FIXATION SURGERY  02/26/2020  .  XI ROBOTIC ASSISTED THORASCOPY FOR BIOPSY AP WINDOW LYMPH NODES (Left)  12/18/2020     Prior to Admission medications   Medication Sig Start Date End Date Taking? Authorizing Provider  acetaminophen (TYLENOL) 325 MG tablet Take 650 mg by mouth every 6 (six) hours as needed for moderate pain.   Yes [provider]  atorvastatin (LIPITOR) 10 MG tablet Take 10 mg by mouth daily. 09/30/20  Yes [provider]  brimonidine (ALPHAGAN) 0.2 % ophthalmic solution Place 1 drop into both eyes 2 (two) times daily.    Yes [provider]   cyclobenzaprine (FLEXERIL) 10 MG tablet Take 10 mg by mouth 3 (three) times daily.   Yes [provider]  dapagliflozin propanediol (FARXIGA) 10 MG TABS tablet Take 1 tablet (10 mg total) by mouth daily. 10/23/20  Yes Barb Merino, MD  digoxin (LANOXIN) 0.125 MG tablet Take 1 tablet (0.125 mg total) by mouth daily. 10/23/20 11/22/20 Yes Barb Merino, MD  divalproex (DEPAKOTE) 500 MG DR tablet Take 1 tablet (500 mg total) by mouth 2 (two) times daily. 12/14/20  Yes Lucrezia Starch, MD  furosemide (LASIX) 40 MG tablet Take 1 tablet (40 mg total) by mouth daily. 10/23/20  Yes Ghimire, Dante Gang, MD  insulin detemir (LEVEMIR) 100 UNIT/ML FlexPen Inject 25 Units into the skin daily. 10/23/20  Yes Ghimire, Dante Gang, MD  latanoprost (XALATAN) 0.005 % ophthalmic solution Place 1 drop into both eyes at bedtime.    Yes [provider]  levothyroxine (SYNTHROID) 100 MCG tablet Take 100 mcg by mouth daily before breakfast.   Yes [provider]  melatonin 3 MG TABS tablet Take 3 mg by mouth at bedtime. 09/08/20  Yes [provider]  metoprolol succinate (TOPROL-XL) 100 MG 24 hr tablet Take 100 mg by mouth daily. Take with or immediately following a meal.   Yes [provider]  metoprolol succinate (TOPROL-XL) 50 MG 24 hr tablet Take 1 tablet (50 mg total) by mouth at bedtime. Take with or immediately following a meal. 10/23/20 11/22/20 Yes Ghimire, Dante Gang, MD  Paliperidone Palmitate (INVEGA SUSTENNA IM) Inject 1 pen into the muscle every 30 (thirty) days.   Yes [provider]  polyethylene glycol (MIRALAX / GLYCOLAX) 17 g packet Take 17 g by mouth 2 (two) times daily. 10/23/20  Yes Barb Merino, MD  risperiDONE (RISPERDAL) 0.5 MG tablet Take 1 tablet (0.5 mg total) by mouth at bedtime. 12/14/20  Yes Dykstra, Ellwood Dense, MD  sacubitril-valsartan (ENTRESTO) 24-26 MG Take 1 tablet by mouth 2 (two) times daily.   Yes [provider]  senna (SENOKOT) 8.6 MG  TABS tablet Take 2 tablets (17.2 mg total) by mouth 2 (two) times daily. 10/23/20  Yes Barb Merino, MD  sertraline (ZOLOFT) 25 MG tablet Take 1 tablet (25 mg total) by mouth daily. 12/14/20  Yes Lucrezia Starch, MD  spironolactone (ALDACTONE) 25 MG tablet Take 1 tablet (25 mg total) by mouth daily. 10/23/20 11/22/20 Yes Ghimire, Dante Gang, MD  tamsulosin (FLOMAX) 0.4 MG CAPS capsule Take 0.4 mg by mouth daily. 09/08/20  Yes [provider]  blood glucose meter kit and supplies KIT Dispense based on patient and insurance preference. Use up to four times daily as directed. (FOR ICD-9 250.00, 250.01). 10/23/20   Barb Merino, MD  Insulin Pen Needle (PEN NEEDLES 29GX1/2") 29G X 12MM MISC 1 each by Does not apply route daily. 10/23/20   Barb Merino, MD    Inpatient Medications: Scheduled Meds: . acetaminophen  1,000  mg Oral Q6H   Or  . acetaminophen (TYLENOL) oral liquid 160 mg/5 mL  1,000 mg Oral Q6H  . atorvastatin  10 mg Oral Daily  . bisacodyl  10 mg Oral Daily  . brimonidine  1 drop Both Eyes BID  . Chlorhexidine Gluconate Cloth  6 each Topical Daily  . dapagliflozin propanediol  10 mg Oral Daily  . divalproex  500 mg Oral BID  . enoxaparin (LOVENOX) injection  40 mg Subcutaneous Q24H  . furosemide  40 mg Oral Daily  . insulin aspart  0-24 Units Subcutaneous TID AC  . ipratropium-albuterol  3 mL Nebulization BID  . ketorolac  15 mg Intravenous Q6H  . latanoprost  1 drop Both Eyes QHS  . levothyroxine  100 mcg Oral QAC breakfast  . melatonin  3 mg Oral QHS  . metoprolol succinate  100 mg Oral Daily  . pantoprazole  40 mg Oral Daily  . polyethylene glycol  17 g Oral BID  . risperiDONE  0.5 mg Oral QHS  . sacubitril-valsartan  1 tablet Oral BID  . senna  2 tablet Oral BID  . senna-docusate  1 tablet Oral QHS  . sertraline  25 mg Oral Daily  . tamsulosin  0.4 mg Oral Daily   Continuous Infusions: . sodium chloride 50 mL/hr at 12/19/20 0700   PRN Meds: ondansetron  (ZOFRAN) IV, traMADol  Allergies:    Allergies  Allergen Reactions  . Ibuprofen   . Penicillins Rash    Social History:   Social History   Socioeconomic History  . Marital status: Divorced    Spouse name: Not on file  . Number of children: 1  . Years of education: Not on file  . Highest education level: Not on file  Occupational History  . Not on file  Tobacco Use  . Smoking status: Current Every Day Smoker    Packs/day: 0.25    Types: Cigarettes  . Smokeless tobacco: Never Used  . Tobacco comment: occasionally  Vaping Use  . Vaping Use: Never used  Substance and Sexual Activity  . Alcohol use: Never  . Drug use: Not Currently    Types: Cocaine  . Sexual activity: Not Currently  Other Topics Concern  . Not on file  Social History Narrative  . Not on file   Social Determinants of Health   Financial Resource Strain: Not on file  Food Insecurity: Not on file  Transportation Needs: Not on file  Physical Activity: Not on file  Stress: Not on file  Social Connections: Not on file  Intimate Partner Violence: Not on file    Family History:   Family History  Problem Relation Age of Onset  . Hypertension Mother   . Hyperlipidemia Mother   . Stroke Mother   . Cancer Mother   . Hypertension Father   . Hyperlipidemia Father   . Healthy Sister    Family Status:  Family Status  Relation Name Status  . Mother  (Not Specified)  . Father  (Not Specified)  . Sister  (Not Specified)  . Brother  Deceased    ROS:  Please see the history of present illness.  All other ROS reviewed and negative.     Physical Exam/Data:   Vitals:   12/19/20 0600 12/19/20 0722 12/19/20 0800 12/19/20 1103  BP:   91/63 91/66  Pulse: 92  92 89  Resp: 20  (!) 25   Temp:   97.7 F (36.5 C) 97.8 F (36.6 C)  TempSrc:   Oral Oral  SpO2: 94% 96% 93% 93%  Weight:      Height:        Intake/Output Summary (Last 24 hours) at 12/19/2020 1431 Last data filed at 12/19/2020 0756 Gross  per 24 hour  Intake 840 ml  Output 704 ml  Net 136 ml    Last 3 Weights 12/18/2020 12/16/2020 11/29/2020  Weight (lbs) 253 lb 4.8 oz 253 lb 4.8 oz 255 lb  Weight (kg) 114.896 kg 114.896 kg 115.667 kg     Body mass index is 37.41 kg/m.   General:  Well nourished, well developed, male in no acute distress HEENT: normal Lymph: no adenopathy Neck: JVD -10 cm Endocrine:  No thryomegaly Vascular: No carotid bruits; 4/4 extremity pulses 2+  Cardiac:  normal S1, S2; RRR; no murmur Lungs: Rales bases bilaterally, no wheezing, rhonchi  Abd: soft, nontender, no hepatomegaly  Ext: no edema Musculoskeletal:  No deformities, BUE and BLE strength normal and equal Skin: warm and dry  Neuro:  CNs 2-12 intact, no focal abnormalities noted Psych:  Normal affect   EKG:  The EKG was personally reviewed and demonstrates: 12/13/2020 ECG is sinus tachycardia, heart rate 111, no acute ischemic changes Telemetry:  Telemetry was personally reviewed and demonstrates: Sinus rhythm/sinus tach   CV studies:   ECHO: 10/11/2020 1. Left ventricular ejection fraction, by estimation, is 20 to 25%. The  left ventricle has severely decreased function. The left ventricle  demonstrates global hypokinesis. The left ventricular internal cavity size  was severely dilated. Left ventricular  diastolic parameters are indeterminate.  2. Right ventricular systolic function was not well visualized. The right  ventricular size is not well visualized.  3. The mitral valve is normal in structure. No evidence of mitral valve  regurgitation.  4. The aortic valve was not well visualized. Aortic valve regurgitation  is not visualized. No aortic stenosis is present.   CATH: 81/85/6314  LV end diastolic pressure is severely elevated. LVEDP 29 mm Hg.  There is no aortic valve stenosis.  No angiographically apparent CAD.  Ao sat 98%, PA sat 69%, PA pressure 34/22, mean 26 mm Hg; mean PCWP 26 mm Hg; CO 4.96 L/min; CI 2.09    Continue medical therapy for nonischemic cardiomyopathy.   Laboratory Data:   Chemistry Recent Labs  Lab 12/12/20 1803 12/16/20 1341 12/19/20 0410  NA 138 133* 135  K 4.5 4.5 5.3*  CL 103 101 101  CO2 23 19* 24  GLUCOSE 122* 116* 123*  BUN 14 14 21*  CREATININE 1.36* 1.10 1.29*  CALCIUM 9.6 9.1 8.9  GFRNONAA >60 >60 >60  ANIONGAP 12 13 10     Lab Results  Component Value Date   ALT 20 12/16/2020   AST 25 12/16/2020   ALKPHOS 96 12/16/2020   BILITOT 1.0 12/16/2020   Hematology Recent Labs  Lab 12/12/20 1803 12/16/20 1341 12/19/20 0410  WBC 11.7* 10.4 10.7*  RBC 6.27* 6.38* 5.80  HGB 16.3 16.2 14.7  HCT 49.0 51.0 46.7  MCV 78.1* 79.9* 80.5  MCH 26.0 25.4* 25.3*  MCHC 33.3 31.8 31.5  RDW 18.6* 18.7* 17.9*  PLT 240 211 164   Cardiac Enzymes High Sensitivity Troponin:   Recent Labs  Lab 11/29/20 1745 11/29/20 2004  TROPONINIHS 14 15      BNPNo results for input(s): BNP, PROBNP in the last 168 hours.  DDimer No results for input(s): DDIMER in the last 168 hours. TSH:  Lab Results  Component Value Date  TSH 5.182 (H) 10/10/2020   Lipids: Lab Results  Component Value Date   CHOL 217 (H) 10/13/2020   HDL 41 10/13/2020   LDLCALC 116 (H) 10/13/2020   TRIG 302 (H) 10/13/2020   CHOLHDL 5.3 10/13/2020   HgbA1c: Lab Results  Component Value Date   HGBA1C 8.1 (H) 10/11/2020   Magnesium: No results found for: MG   Radiology/Studies:  DG Chest 2 View  Result Date: 12/17/2020 CLINICAL DATA:  Lymphoma. Ischemic cardiomyopathy. Pre-op respiratory exam EXAM: CHEST - 2 VIEW COMPARISON:  01/02/2021 FINDINGS: Cardiomegaly and pulmonary vascular congestion are again seen. No evidence of pulmonary edema or focal infiltrate. No evidence of pleural effusion. IMPRESSION: Cardiomegaly and pulmonary vascular congestion. No active lung disease. Electronically Signed   By: Marlaine Hind M.D.   On: 12/17/2020 07:49   DG Chest Port 1 View  Result Date:  12/19/2020 CLINICAL DATA:  Status post chest tube placement EXAM: PORTABLE CHEST 1 VIEW COMPARISON:  12/18/2020 FINDINGS: Cardiac shadow is mildly enlarged but stable. Right jugular central line is again seen. Left-sided chest tube is again noted and stable. No pneumothorax is seen. Stable changes of vascular congestion and edema. No new focal abnormality is noted. IMPRESSION: Stable appearance of the chest when compared with the prior exam. No pneumothorax is noted. Electronically Signed   By: Inez Catalina M.D.   On: 12/19/2020 08:06   DG Chest Port 1 View  Result Date: 12/18/2020 CLINICAL DATA:  Status post thoracoscopy EXAM: PORTABLE CHEST 1 VIEW COMPARISON:  12/16/2020 FINDINGS: Cardiac shadow is enlarged but stable. Right jugular central line is in the superior vena cava. Left-sided chest tube is noted in place. No pneumothorax is seen. Generalized increased density is noted throughout both lungs likely related to mild edema and vascular congestion. No other focal abnormality is noted. IMPRESSION: No evidence of pneumothorax. Tubes and lines as described. Changes consistent with mild pulmonary edema. Electronically Signed   By: Inez Catalina M.D.   On: 12/18/2020 14:58    Assessment and Plan:   1.  Acute on chronic systolic CHF: -Chest x-ray from 2/10 shows mild pulmonary edema - I/O Positive by 1.4 L this admission -He is on his home Lasix dose of 40 mg daily -Discussed with MD giving him a one-time dose of Lasix 40 mg IV - will do -Then resume previous Lasix dose of 40 mg daily -Daily weights would be very important in his management, need to see if we can get his facility set of scales -He was seen by cardiology during his December admission, has not been seen since -At that time, his CHF meds were Lasix 40 mg daily, digoxin 0.125 mg daily, Toprol-XL 50 mg nightly, Entresto 24-26 mg twice daily, spir 25 mg qd -However, he was given a 1 month prescription for the digoxin, the Toprol, and the  spironolactone.  -The Toprol-XL was increased to 100 mg a day, the digoxin and the spironolactone ended, other preadmission meds were unchanged. -His potassium has always been in the mid-high fours, but was 5.3 today -continue the [place], but do not restart the spironolactone  2.  Hypothyroidism -His TSH was mildly elevated when it was checked in December. -He has been on the same dose of levothyroxine, 100 mcg, since that time  3. Tachycardia - pt HR has been chronically elevated - continue BB as BP/HR will allow  Otherwise, per TCTS Active Problems:   Mediastinal adenopathy  [facility] will sign off.   Medication Recommendations: Lasix 40  mg daily, Entresto 24-26 mg bid, Toprol-XL 100 mg daily, no digoxin or spironolactone, other meds per TCTS Other recommendations (labs, testing, etc):  none Follow up as an outpatient:  We will arrange f/u with Fabian Sharp, [facility] for Dr Harrell Gave, BMET at that appt   For questions or updates, please contact [facility] Please consult [email or web address] for contact info under Cardiology/STEMI.   Signed, Rosaria Ferries, PA-C  12/19/2020 2:31 PM   I have examined the patient and reviewed assessment and plan and discussed with patient.  Agree with above as stated.    Nonischemic cardiomyopathy.  Mild potassium increase.  Mild volume overload.  Will give a dose of IV Lasix.  COntinue Entresto, Toprol, Lasix.  Stop digoxin ans level was quite low.  Low salt diet.   Will set up [facility] next week to f/u potassium and renal function.  Larae Grooms

## 2020-12-19 NOTE — Anesthesia Postprocedure Evaluation (Signed)
Anesthesia Post Note  Patient: Charles [name]  Procedure(s) Performed: XI ROBOTIC ASSISTED THORASCOPY FOR BIOPSY AP WINDOW LYMPH NODES (Left Chest) LYMPH NODE BIOPSY (Left ) INTERCOSTAL NERVE BLOCK (Left Chest)     Patient location during evaluation: PACU Anesthesia Type: General Level of consciousness: awake and alert Pain management: pain level controlled Vital Signs Assessment: post-procedure vital signs reviewed and stable Respiratory status: spontaneous breathing, nonlabored ventilation, respiratory function stable and patient connected to nasal cannula oxygen Cardiovascular status: blood pressure returned to baseline and stable Postop Assessment: no apparent nausea or vomiting Anesthetic complications: no   No complications documented.  Last Vitals:  Vitals:   12/19/20 0722 12/19/20 0800  BP:  91/63  Pulse:  92  Resp:  (!) 25  Temp:  36.5 C  SpO2: 96% 93%    Last Pain:  Vitals:   12/19/20 0800  TempSrc: Oral  PainSc:                  Mertens S

## 2020-12-19 NOTE — Progress Notes (Addendum)
[address]       [place] [address]             [phone]      1 Day Post-Op Procedure(s) (LRB): XI ROBOTIC ASSISTED THORASCOPY FOR BIOPSY AP WINDOW LYMPH NODES (Left) LYMPH NODE BIOPSY (Left) INTERCOSTAL NERVE BLOCK (Left) Subjective:  mod pain  Objective: Vital signs in last 24 hours: Temp:  [97.5 F (36.4 C)-98.7 F (37.1 C)] 97.7 F (36.5 C) (02/11 0428) Pulse Rate:  [90-124] 92 (02/11 0600) Cardiac Rhythm: Normal sinus rhythm (02/11 0400) Resp:  [14-33] 20 (02/11 0600) BP: (90-113)/(48-86) 97/61 (02/11 0428) SpO2:  [88 %-96 %] 96 % (02/11 0722) Arterial Line BP: (90-117)/(61-89) 102/82 (02/11 0600) Weight:  [114.9 kg] 114.9 kg (02/10 1002)  Hemodynamic parameters for last 24 hours:    Intake/Output from previous day: 02/10 0701 - 02/11 0700 In: 2140 [P.O.:240; I.V.:1900] Out: 730 [Urine:680; Chest Tube:50] Intake/Output this shift: No intake/output data recorded.  General appearance: alert, cooperative, distracted and no distress Heart: regular rate and rhythm Lungs: clear to auscultation bilaterally Abdomen: benign Wound: incis dressing CDI  Lab Results: Recent Labs    12/16/20 1341 12/19/20 0410  WBC 10.4 10.7*  HGB 16.2 14.7  HCT 51.0 46.7  PLT 211 164   BMET:  Recent Labs    12/16/20 1341 12/19/20 0410  NA 133* 135  K 4.5 5.3*  CL 101 101  CO2 19* 24  GLUCOSE 116* 123*  BUN 14 21*  CREATININE 1.10 1.29*  CALCIUM 9.1 8.9    PT/INR:  Recent Labs    12/16/20 1341  LABPROT 13.0  INR 1.0   ABG    Component Value Date/Time   PHART 7.411 12/16/2020 1401   HCO3 24.0 12/16/2020 1401   TCO2 34 (H) 10/14/2020 1019   ACIDBASEDEF 0.0 12/16/2020 1401   O2SAT 96.9 12/16/2020 1401   CBG (last 3)  Recent Labs    12/18/20 1754 12/18/20 2141 12/19/20 0609  GLUCAP 145* 233* 110*    Meds Scheduled Meds: . acetaminophen  1,000 mg Oral Q6H   Or  . acetaminophen (TYLENOL) oral liquid 160 mg/5 mL  1,000 mg  Oral Q6H  . atorvastatin  10 mg Oral Daily  . bisacodyl  10 mg Oral Daily  . brimonidine  1 drop Both Eyes BID  . dapagliflozin propanediol  10 mg Oral Daily  . divalproex  500 mg Oral BID  . enoxaparin (LOVENOX) injection  40 mg Subcutaneous Q24H  . furosemide  40 mg Oral Daily  . insulin aspart  0-24 Units Subcutaneous TID AC  . ipratropium-albuterol  3 mL Nebulization BID  . ketorolac  15 mg Intravenous Q6H  . latanoprost  1 drop Both Eyes QHS  . levothyroxine  100 mcg Oral QAC breakfast  . melatonin  3 mg Oral QHS  . metoprolol succinate  100 mg Oral Daily  . pantoprazole  40 mg Oral Daily  . polyethylene glycol  17 g Oral BID  . risperiDONE  0.5 mg Oral QHS  . sacubitril-valsartan  1 tablet Oral BID  . senna  2 tablet Oral BID  . senna-docusate  1 tablet Oral QHS  . sertraline  25 mg Oral Daily  . tamsulosin  0.4 mg Oral Daily   Continuous Infusions: . sodium chloride    . sodium chloride 50 mL/hr at 12/19/20 0700   PRN Meds:.Place/Maintain arterial line **AND** sodium chloride, ondansetron (ZOFRAN) IV, traMADol  Xrays DG Chest  Port 1 View  Result Date: 12/18/2020 CLINICAL DATA:  Status post thoracoscopy EXAM: PORTABLE CHEST 1 VIEW COMPARISON:  12/16/2020 FINDINGS: Cardiac shadow is enlarged but stable. Right jugular central line is in the superior vena cava. Left-sided chest tube is noted in place. No pneumothorax is seen. Generalized increased density is noted throughout both lungs likely related to mild edema and vascular congestion. No other focal abnormality is noted. IMPRESSION: No evidence of pneumothorax. Tubes and lines as described. Changes consistent with mild pulmonary edema. Electronically Signed   By: Inez Catalina M.D.   On: 12/18/2020 14:58    Assessment/Plan: S/P Procedure(s) (LRB): XI ROBOTIC ASSISTED THORASCOPY FOR BIOPSY AP WINDOW LYMPH NODES (Left) LYMPH NODE BIOPSY (Left) INTERCOSTAL NERVE BLOCK (Left)   1 afeb, Vss 2 sats ok on 4 liters 3 creat  bumped a little, will d/c toradol 4 CXR- no pntx 5 CT drainage 50 cc-  d/c chest tube 6 no anemia 7 K+ 5.3, monitor 8 Lives in assisted living- it's not certain from records if he has been taking his CHF meds- will ask cardiology to re- evaluate which of these meds he should be taking and get care management to assist with getting back to assisted living tomorrow. 9 path pending      LOS: 1 day    John Giovanni PA-C [phone] 12/19/2020 Patient seen and examined, agree with above  Remo Lipps C. Roxan Hockey, MD [facility] [phone]

## 2020-12-19 NOTE — Plan of Care (Signed)
  Problem: Education: Goal: Knowledge of General Education information will improve Description: Including pain rating scale, medication(s)/side effects and non-pharmacologic comfort measures 12/19/2020 0824 by [place], RN Outcome: Progressing 12/18/2020 2050 by [place], RN Outcome: Progressing   Problem: Health Behavior/Discharge Planning: Goal: Ability to manage health-related needs will improve 12/19/2020 0824 by [place], RN Outcome: Progressing 12/18/2020 2050 by [place], RN Outcome: Progressing   Problem: Clinical Measurements: Goal: Ability to maintain clinical measurements within normal limits will improve 12/19/2020 0824 by [place], RN Outcome: Progressing 12/18/2020 2050 by [place], RN Outcome: Progressing Goal: Will remain free from infection 12/19/2020 0824 by [place], RN Outcome: Progressing 12/18/2020 2050 by [place], RN Outcome: Progressing Goal: Diagnostic test results will improve 12/19/2020 0824 by [place], RN Outcome: Progressing 12/18/2020 2050 by [place], RN Outcome: Progressing Goal: Respiratory complications will improve 12/19/2020 0824 by [place], RN Outcome: Progressing 12/18/2020 2050 by [place], RN Outcome: Progressing Goal: Cardiovascular complication will be avoided 12/19/2020 0824 by [place], RN Outcome: Progressing 12/18/2020 2050 by [place], RN Outcome: Progressing   Problem: Activity: Goal: Risk for activity intolerance will decrease 12/19/2020 0824 by [place], RN Outcome: Progressing 12/18/2020 2050 by [place], RN Outcome: Progressing   Problem: Nutrition: Goal: Adequate nutrition will be maintained 12/19/2020 0824 by [place], RN Outcome: Progressing 12/18/2020 2050 by [place], RN Outcome: Progressing   Problem: Coping: Goal: Level of anxiety will decrease 12/19/2020  0824 by [place], RN Outcome: Progressing 12/18/2020 2050 by [place], RN Outcome: Progressing   Problem: Elimination: Goal: Will not experience complications related to bowel motility 12/19/2020 0824 by [place], RN Outcome: Progressing 12/18/2020 2050 by [place], RN Outcome: Progressing Goal: Will not experience complications related to urinary retention 12/19/2020 0824 by [place], RN Outcome: Progressing 12/18/2020 2050 by [place], RN Outcome: Progressing   Problem: Pain Managment: Goal: General experience of comfort will improve 12/19/2020 0824 by [place], RN Outcome: Progressing 12/18/2020 2050 by [place], RN Outcome: Progressing   Problem: Safety: Goal: Ability to remain free from injury will improve 12/19/2020 0824 by [place], RN Outcome: Progressing 12/18/2020 2050 by [place], RN Outcome: Progressing   Problem: Skin Integrity: Goal: Risk for impaired skin integrity will decrease 12/19/2020 0824 by [place], RN Outcome: Progressing 12/18/2020 2050 by [place], RN Outcome: Progressing

## 2020-12-20 ENCOUNTER — Inpatient Hospital Stay (HOSPITAL_COMMUNITY): Payer: Medicaid Other

## 2020-12-20 DIAGNOSIS — R000 Tachycardia, unspecified: Secondary | ICD-10-CM | POA: Diagnosis not present

## 2020-12-20 DIAGNOSIS — E119 Type 2 diabetes mellitus without complications: Secondary | ICD-10-CM

## 2020-12-20 DIAGNOSIS — E785 Hyperlipidemia, unspecified: Secondary | ICD-10-CM

## 2020-12-20 DIAGNOSIS — I10 Essential (primary) hypertension: Secondary | ICD-10-CM

## 2020-12-20 DIAGNOSIS — R590 Localized enlarged lymph nodes: Secondary | ICD-10-CM | POA: Diagnosis not present

## 2020-12-20 DIAGNOSIS — E1169 Type 2 diabetes mellitus with other specified complication: Secondary | ICD-10-CM

## 2020-12-20 DIAGNOSIS — I5043 Acute on chronic combined systolic (congestive) and diastolic (congestive) heart failure: Secondary | ICD-10-CM | POA: Diagnosis not present

## 2020-12-20 LAB — BASIC METABOLIC PANEL
Anion gap: 12 (ref 5–15)
BUN: 18 mg/dL (ref 6–20)
CO2: 23 mmol/L (ref 22–32)
Calcium: 8.8 mg/dL — ABNORMAL LOW (ref 8.9–10.3)
Chloride: 102 mmol/L (ref 98–111)
Creatinine, Ser: 1.21 mg/dL (ref 0.61–1.24)
GFR, Estimated: >60 mL/min (ref >60)
Glucose, Bld: 112 mg/dL — ABNORMAL HIGH (ref 70–99)
Potassium: 4.1 mmol/L (ref 3.5–5.1)
Sodium: 137 mmol/L (ref 135–145)

## 2020-12-20 LAB — CBC
HCT: 43.6 % (ref 39.0–52.0)
Hemoglobin: 13.9 g/dL (ref 13.0–17.0)
MCH: 25.4 pg — ABNORMAL LOW (ref 26.0–34.0)
MCHC: 31.9 g/dL (ref 30.0–36.0)
MCV: 79.6 fL — ABNORMAL LOW (ref 80.0–100.0)
Platelets: 167 10*3/uL (ref 150–400)
RBC: 5.48 MIL/uL (ref 4.22–5.81)
RDW: 17.2 % — ABNORMAL HIGH (ref 11.5–15.5)
WBC: 14.9 10*3/uL — ABNORMAL HIGH (ref 4.0–10.5)
nRBC: 0 % (ref 0.0–0.2)

## 2020-12-20 LAB — TROPONIN I (HIGH SENSITIVITY)
Troponin I (High Sensitivity): 29 ng/L — ABNORMAL HIGH (ref <18)
Troponin I (High Sensitivity): 34 ng/L — ABNORMAL HIGH (ref <18)

## 2020-12-20 LAB — GLUCOSE, CAPILLARY
Glucose-Capillary: 208 mg/dL — ABNORMAL HIGH (ref 70–99)
Glucose-Capillary: 154 mg/dL — ABNORMAL HIGH (ref 70–99)
Glucose-Capillary: 173 mg/dL — ABNORMAL HIGH (ref 70–99)
Glucose-Capillary: 164 mg/dL — ABNORMAL HIGH (ref 70–99)

## 2020-12-20 LAB — MAGNESIUM: Magnesium: 2 mg/dL (ref 1.7–2.4)

## 2020-12-20 MED ORDER — OXYCODONE HCL 5 MG PO TABS
5.0000 mg | ORAL_TABLET | ORAL | Status: DC | PRN
  Administered 2020-12-20 – 2020-12-21 (×2): 5 mg via ORAL
  Filled 2020-12-20 (×2): qty 1

## 2020-12-20 MED ORDER — IPRATROPIUM-ALBUTEROL 0.5-2.5 (3) MG/3ML IN SOLN: 3.0000 mL | Freq: Four times a day (QID) | RESPIRATORY_TRACT | Status: DC | PRN

## 2020-12-20 MED ORDER — FUROSEMIDE 10 MG/ML IJ SOLN
80.0000 mg | Freq: Once | INTRAMUSCULAR | Status: AC
  Administered 2020-12-20: 80 mg via INTRAVENOUS
  Filled 2020-12-20: qty 8

## 2020-12-20 MED ORDER — FUROSEMIDE 10 MG/ML IJ SOLN: 80.0000 mg | Freq: Once | INTRAMUSCULAR | Status: AC

## 2020-12-20 MED ORDER — NITROGLYCERIN 0.4 MG SL SUBL
0.4000 mg | SUBLINGUAL_TABLET | SUBLINGUAL | Status: DC | PRN
  Administered 2020-12-20: 0.4 mg via SUBLINGUAL
  Filled 2020-12-20: qty 1

## 2020-12-20 MED ORDER — ALUM & MAG HYDROXIDE-SIMETH 200-200-20 MG/5ML PO SUSP: 30.0000 mL | ORAL | Status: DC | PRN

## 2020-12-20 MED ORDER — METOCLOPRAMIDE HCL 5 MG/ML IJ SOLN
10.0000 mg | Freq: Four times a day (QID) | INTRAMUSCULAR | Status: DC
  Administered 2020-12-20 – 2020-12-24 (×8): 10 mg via INTRAVENOUS
  Filled 2020-12-20 (×8): qty 2

## 2020-12-20 MED ORDER — LIDOCAINE VISCOUS HCL 2 % MT SOLN
15.0000 mL | OROMUCOSAL | Status: DC | PRN
  Filled 2020-12-20: qty 15

## 2020-12-20 MED ORDER — FUROSEMIDE 10 MG/ML IJ SOLN
INTRAMUSCULAR | Status: AC
  Administered 2020-12-20: 80 mg
  Filled 2020-12-20: qty 8

## 2020-12-20 NOTE — Progress Notes (Addendum)
      [address]       [place] [address]             [phone]       2 Days Post-Op Procedure(s) (LRB): XI ROBOTIC ASSISTED THORASCOPY FOR BIOPSY AP WINDOW LYMPH NODES (Left) LYMPH NODE BIOPSY (Left) INTERCOSTAL NERVE BLOCK (Left)  Subjective: Patient had chest pain mid sternum earlier this am. He states it is still there but no worse. He states his breathing is "so so".  Objective: Vital signs in last 24 hours: Temp:  [97.6 F (36.4 C)-97.8 F (36.6 C)] 97.8 F (36.6 C) (02/12 0805) Pulse Rate:  [89-132] 132 (02/12 1100) Cardiac Rhythm: Sinus tachycardia (02/12 0900) Resp:  [15-34] 34 (02/12 1100) BP: (90-119)/(59-88) 117/67 (02/12 1100) SpO2:  [91 %-97 %] 91 % (02/12 1100)      Intake/Output from previous day: 02/11 0701 - 02/12 0700 In: 120 [P.O.:120] Out: 1999 [Urine:1975; Chest Tube:24]   Physical Exam:  Cardiovascular: Tachycardic Pulmonary: Crackles bilaterally Abdomen: Soft, non tender, bowel sounds present. Extremities: Mild bilateral lower extremity edema. Wounds: Clean and dry.  No erythema or signs of infection.   Lab Results: ZJQ:DUKRCV Labs    12/19/20 0410 12/20/20 0157  WBC 10.7* 14.9*  HGB 14.7 13.9  HCT 46.7 43.6  PLT 164 167   BMET:  Recent Labs    12/19/20 0410 12/20/20 0912  NA 135 137  K 5.3* 4.1  CL 101 102  CO2 24 23  GLUCOSE 123* 112*  BUN 21* 18  CREATININE 1.29* 1.21  CALCIUM 8.9 8.8*    PT/INR: No results for input(s): LABPROT, INR in the last 72 hours. ABG:  INR: Will add last result for INR, ABG once components are confirmed Will add last 4 CBG results once components are confirmed  Assessment/Plan:  1. CV - Tachycardic at times. On Toprol XL 100 mg daily  and Entresto bid. Regarding chest pain, no changes on tele, or EKG. Cardiology evaluated this am.  2.  Pulmonary - On 3 liters of oxygen via [place]. Wean as able. [place]ce[place] CXR in am. He has already been given Lasix 80 mg IV. 3. DM-CBGs  121/208/154. On Insulin. 4. History of hypothyroidism-continue Levothyroxine 100 mcg daily  Donielle M ZimmermanPA-C 12/20/2020,11:43 AM [phone]  I have seen and examined the patient and agree with the assessment and plan as outlined.  Chest pain sounds atypical, likely related to surgery.  D/C when okay w/ cardiology team.  Rexene Alberts, MD 12/20/2020 12:39 PM

## 2020-12-20 NOTE — Progress Notes (Signed)
Patient c/o chest pain still, patient received tramadol at 0730 am, nitroglycerin at 1140, but it didn't work it. Patient stated that chest pain is little better than before, 4 out of 10 around 1400. Donielle came to checked him asked pain medication order since patient stated one spot sharp pain. Administered oxycodone at 1539. Patient's HR came down from 120's to 110's. Pain was better now 2 out of 10. His troponin was 29, 34. Notified Dr. Gasper Sells regarding this and no more third troponin. Pt's lung sound was still crackles sounds. Dr. Gasper Sells ordered one time order for lasix again. HS [employer]

## 2020-12-20 NOTE — Progress Notes (Addendum)
Progress Note  Patient Name: Charles [name] Date of Encounter: 12/20/2020  Primary Cardiologist: [name], MD   Subjective   Patient reported sharp chest pain this morning, as well as some SOB which is worse when laying down. He notes some chest wall TTP.   Inpatient Medications    Scheduled Meds:  acetaminophen  1,000 mg Oral Q6H   Or   acetaminophen (TYLENOL) oral liquid 160 mg/5 mL  1,000 mg Oral Q6H   atorvastatin  10 mg Oral Daily   bisacodyl  10 mg Oral Daily   brimonidine  1 drop Both Eyes BID   Chlorhexidine Gluconate Cloth  6 each Topical Daily   dapagliflozin propanediol  10 mg Oral Daily   divalproex  500 mg Oral BID   enoxaparin (LOVENOX) injection  40 mg Subcutaneous Q24H   furosemide  80 mg Intravenous Once   furosemide  40 mg Oral Daily   insulin aspart  0-24 Units Subcutaneous TID AC   ipratropium-albuterol  3 mL Nebulization BID   latanoprost  1 drop Both Eyes QHS   levothyroxine  100 mcg Oral QAC breakfast   melatonin  3 mg Oral QHS   metoCLOPramide (REGLAN) injection  10 mg Intravenous Q6H   metoprolol succinate  100 mg Oral Daily   pantoprazole  40 mg Oral Daily   polyethylene glycol  17 g Oral BID   risperiDONE  0.5 mg Oral QHS   sacubitril-valsartan  1 tablet Oral BID   senna  2 tablet Oral BID   senna-docusate  1 tablet Oral QHS   sertraline  25 mg Oral Daily   tamsulosin  0.4 mg Oral Daily   Continuous Infusions:  sodium chloride Stopped (12/20/20 0821)   PRN Meds: alum & mag hydroxide-simeth **AND** lidocaine, ondansetron (ZOFRAN) IV, traMADol   Vital Signs    Vitals:   12/19/20 1930 12/19/20 2300 12/20/20 0300 12/20/20 0805  BP:  117/73 109/76 113/88  Pulse: 89 (!) 102 95 (!) 116  Resp:  15 (!) 28 (!) 27  Temp:  97.8 F (36.6 C) 97.6 F (36.4 C)   TempSrc:  Oral Oral   SpO2: 97% 94% 92% 93%  Weight:      Height:        Intake/Output Summary (Last 24 hours) at 12/20/2020 0846 Last data filed at 12/20/2020 0522 Gross  per 24 hour  Intake 120 ml  Output 1975 ml  Net -1855 ml   Filed Weights   12/18/20 1002  Weight: 114.9 kg    Telemetry    Sinus tachycardia with rates up to 120s and occasional PVCs - Personally Reviewed  ECG    Sinus tachycardia, rate 116 bpm, non-specific T wave abnormalities, no STE/D, no significant change from previous - Personally Reviewed  Physical Exam   GEN: No acute distress.   Neck: No JVD, no carotid bruits Cardiac: tachycardic, regular rhythm, no murmurs, rubs, or gallops.  Respiratory: bilateral crackles at lung bases L>R  GI: NABS, Soft, nontender, non-distended  MS: No edema; No deformity. Neuro:  Nonfocal, moving all extremities spontaneously Psych: Normal affect   Labs    Chemistry Recent Labs  Lab 12/16/20 1341 12/19/20 0410  NA 133* 135  K 4.5 5.3*  CL 101 101  CO2 19* 24  GLUCOSE 116* 123*  BUN 14 21*  CREATININE 1.10 1.29*  CALCIUM 9.1 8.9  PROT 7.0  --   ALBUMIN 3.9  --   AST 25  --   ALT  20  --   ALKPHOS 96  --   BILITOT 1.0  --   GFRNONAA >60 >60  ANIONGAP 13 10     Hematology Recent Labs  Lab 12/16/20 1341 12/19/20 0410 12/20/20 0157  WBC 10.4 10.7* 14.9*  RBC 6.38* 5.80 5.48  HGB 16.2 14.7 13.9  HCT 51.0 46.7 43.6  MCV 79.9* 80.5 79.6*  MCH 25.4* 25.3* 25.4*  MCHC 31.8 31.5 31.9  RDW 18.7* 17.9* 17.2*  PLT 211 164 167    Cardiac EnzymesNo results for input(s): TROPONINI in the last 168 hours. No results for input(s): TROPIPOC in the last 168 hours.   BNPNo results for input(s): BNP, PROBNP in the last 168 hours.   DDimer No results for input(s): DDIMER in the last 168 hours.   Radiology    DG Chest Port 1 View  Result Date: 12/20/2020 CLINICAL DATA:  Status post chest tube removal EXAM: PORTABLE CHEST 1 VIEW COMPARISON:  December 19, 2020 FINDINGS: Left chest tube is been removed. Central catheter tip is in the superior vena cava. No pneumothorax evident. There is cardiomegaly with pulmonary venous  hypertension. There is ill-defined opacity in the right perihilar region as well as areas of interstitial pulmonary edema. No appreciable new opacity. No bone lesions. No adenopathy. IMPRESSION: No evident pneumothorax. Cardiomegaly with pulmonary vascular congestion. There is a degree of interstitial edema. Ill-defined opacity right perihilar region likely represents alveolar edema. The overall appearance suggests a degree of underlying congestive heart failure. Electronically Signed   By: Lowella Grip III M.D.   On: 12/20/2020 08:40   DG Chest Port 1 View  Result Date: 12/19/2020 CLINICAL DATA:  Status post chest tube placement EXAM: PORTABLE CHEST 1 VIEW COMPARISON:  12/18/2020 FINDINGS: Cardiac shadow is mildly enlarged but stable. Right jugular central line is again seen. Left-sided chest tube is again noted and stable. No pneumothorax is seen. Stable changes of vascular congestion and edema. No new focal abnormality is noted. IMPRESSION: Stable appearance of the chest when compared with the prior exam. No pneumothorax is noted. Electronically Signed   By: Inez Catalina M.D.   On: 12/19/2020 08:06   DG Chest Port 1 View  Result Date: 12/18/2020 CLINICAL DATA:  Status post thoracoscopy EXAM: PORTABLE CHEST 1 VIEW COMPARISON:  12/16/2020 FINDINGS: Cardiac shadow is enlarged but stable. Right jugular central line is in the superior vena cava. Left-sided chest tube is noted in place. No pneumothorax is seen. Generalized increased density is noted throughout both lungs likely related to mild edema and vascular congestion. No other focal abnormality is noted. IMPRESSION: No evidence of pneumothorax. Tubes and lines as described. Changes consistent with mild pulmonary edema. Electronically Signed   By: Inez Catalina M.D.   On: 12/18/2020 14:58    Cardiac Studies   Echocardiogram 10/2020: 1. Left ventricular ejection fraction, by estimation, is 20 to 25%. The  left ventricle has severely decreased  function. The left ventricle  demonstrates global hypokinesis. The left ventricular internal cavity size  was severely dilated. Left ventricular  diastolic parameters are indeterminate.   2. Right ventricular systolic function was not well visualized. The right  ventricular size is not well visualized.   3. The mitral valve is normal in structure. No evidence of mitral valve  regurgitation.   4. The aortic valve was not well visualized. Aortic valve regurgitation  is not visualized. No aortic stenosis is present.  R/LHC 41/9622:  LV end diastolic pressure is severely elevated. LVEDP 29 mm Hg.  There is no aortic valve stenosis. No angiographically apparent CAD. Ao sat 98%, PA sat 69%, PA pressure 34/22, mean 26 mm Hg; mean PCWP 26 mm Hg; CO 4.96 L/min; CI 2.09   Continue medical therapy for nonischemic cardiomyopathy.    Patient Profile     55 y.o. male with a PMH of chronic combined CHF/NICM, HTN, HLD, DM type 2, ETOH and cocaine abuse, bipolar disorder/paranoid schizophrenia, hypothyroidism, and possible lymphoma, who is being followed by cardiology for CHF.  Assessment & Plan    1. Acute on chronic combined CHF/NICM: paged by RN this AM that patient with new sharp chest pain and SOB. He is s/p robotic assisted thorascopy for lymph node biopsy yesterday. He was receiving IVF at 9mL/hr for the past 24 hours. CXR this morning showed pulmonary vascular congestion. He has crackles at lung bases today L>R. He received IV lasix 40mg  yesterday with UOP -1.87L, though IVF not documented for input.  - Will stop IVF at this time.  - Will give IV lasix 80mg  x1 and monitor for response - Will check BMET/Mg this AM - replete as needed to maintain K >4, Mg >2.  - Will place order for strict I&Os and daily weights - Continue metoprolol and entresto  2. Tachycardia: maintaining sinus rhythm with HR up to 120s at times this morning and occasional PVCs. Appears to be a chronic issues.  - Continue  metoprolol   3. HTN: BP soft at times but stable - Managed in the context of #1  4. HLD: LDL 116 10/2020 - Continue atorvastatin  5. Hyperkalemia: K 5.3 on BMET yesterday - Will repeat BMET today for close monitoring  For questions or updates, please contact [place] Please consult [email or web address] for contact info under Cardiology/STEMI.      Signed, Abigail Butts, PA-C  12/20/2020, 8:46 AM   [phone]  Personally seen and examined. Agree with APP above with the following comments: Briefly 55 yo M with NICM, DM with THN, DM with HLD, prior history of cocaine abuse being seen by our service for management of HFrEF in the setting of recent medinastinoscopy (query of lymphoma). Patient notes the he has some sternal indigestion pain last night, that worsened this morning.  Notes that he doesn't feel as bad as in December when he had fluid on his heart.  Chest pain does not radiate.  Comes and goes Exam notable for crackles in the baseline , with regular tachycardia, central line site c/d/I, air movement in all quadrants. Labs are presently pending Personally reviewed relevant tests:  despite history of DM, HTN, HLD, and prior cocaine use, no evidence of obstructive CAD on last cath.  Telemetry  Would recommend : Stopping IVF (done), 80 mg PO IV Lasix X1 and may need PM dose (AM dose completed- patient urinating), Follow up labs (ordered and drawn, also had troponin), PRN Nitroglycerin (ordrered).  CRITICAL CARE Performed by: Tully Burgo A Vermell [place]  Total critical care time: 35 minutes. Critical care time was exclusive of separately billable procedures and treating other patients. Critical care was necessary to treat or prevent imminent or life-threatening deterioration. Critical care was time spent personally by me on the following activities: development of treatment plan with patient and/or surrogate as well as nursing, discussions with consultants, evaluation of patient's  response to treatment, examination of patient, obtaining history from patient or surrogate, ordering and performing treatments, ordering and review of laboratory studies and coordinating care with patient, nursing, and rapid response team.  Discussed  with patient, nursing, and rapid response team who are in agreement with plan.  Signed, Rudean Haskell, [place]  12/20/2020 9:50 AM

## 2020-12-20 NOTE — Progress Notes (Signed)
Patient complains of chest pain, burning in his upper abdomen area.  Tramadol given PO.  Provider paged, Danielle PA says she will come evaluate patient and will order Reglan, says to call her back if anything changes with the patient.

## 2020-12-20 NOTE — Progress Notes (Signed)
Patient c/o chest pain on mid sternum, sharp pain. Paging PA Donielle by night shift nurse and she ordered Reglan for indigestion. Night shift nurse administered Toradol po pain medication. [facility] cardiac PA Daleen Snook regarding unsubside chest pain. EKG done and patient had catheterization last December which was clean. Patient stated that chest pain was worst than before. Lung sound was crackles both lungs and IV fluid was infusing, stopped IV fluid. [facility] PA Daleen Snook again due to worst chest pain and HR went up 120's. She checked patient in the room will give lasix for that. HS [employer]

## 2020-12-21 ENCOUNTER — Inpatient Hospital Stay (HOSPITAL_COMMUNITY): Payer: Medicaid Other

## 2020-12-21 DIAGNOSIS — E785 Hyperlipidemia, unspecified: Secondary | ICD-10-CM | POA: Diagnosis not present

## 2020-12-21 DIAGNOSIS — E119 Type 2 diabetes mellitus without complications: Secondary | ICD-10-CM | POA: Diagnosis not present

## 2020-12-21 DIAGNOSIS — E1169 Type 2 diabetes mellitus with other specified complication: Secondary | ICD-10-CM | POA: Diagnosis not present

## 2020-12-21 DIAGNOSIS — I5043 Acute on chronic combined systolic (congestive) and diastolic (congestive) heart failure: Secondary | ICD-10-CM | POA: Diagnosis not present

## 2020-12-21 LAB — BASIC METABOLIC PANEL
Anion gap: 10 (ref 5–15)
BUN: 18 mg/dL (ref 6–20)
CO2: 31 mmol/L (ref 22–32)
Calcium: 9.4 mg/dL (ref 8.9–10.3)
Chloride: 95 mmol/L — ABNORMAL LOW (ref 98–111)
Creatinine, Ser: 1.23 mg/dL (ref 0.61–1.24)
GFR, Estimated: >60 mL/min (ref >60)
Glucose, Bld: 120 mg/dL — ABNORMAL HIGH (ref 70–99)
Potassium: 4.4 mmol/L (ref 3.5–5.1)
Sodium: 136 mmol/L (ref 135–145)

## 2020-12-21 LAB — GLUCOSE, CAPILLARY
Glucose-Capillary: 156 mg/dL — ABNORMAL HIGH (ref 70–99)
Glucose-Capillary: 133 mg/dL — ABNORMAL HIGH (ref 70–99)
Glucose-Capillary: 121 mg/dL — ABNORMAL HIGH (ref 70–99)
Glucose-Capillary: 140 mg/dL — ABNORMAL HIGH (ref 70–99)

## 2020-12-21 LAB — BASIC METABOLIC PANEL WITH GFR
Anion gap: 12 (ref 5–15)
BUN: 19 mg/dL (ref 6–20)
CO2: 28 mmol/L (ref 22–32)
Calcium: 9 mg/dL (ref 8.9–10.3)
Chloride: 96 mmol/L — ABNORMAL LOW (ref 98–111)
Creatinine, Ser: 1.3 mg/dL — ABNORMAL HIGH (ref 0.61–1.24)
GFR, Estimated: >60 mL/min
Glucose, Bld: 108 mg/dL — ABNORMAL HIGH (ref 70–99)
Potassium: 4.4 mmol/L (ref 3.5–5.1)
Sodium: 136 mmol/L (ref 135–145)

## 2020-12-21 MED ORDER — FUROSEMIDE 10 MG/ML IJ SOLN
120.0000 mg | Freq: Two times a day (BID) | INTRAVENOUS | Status: DC
  Administered 2020-12-21 (×2): 120 mg via INTRAVENOUS
  Filled 2020-12-21 (×2): qty 12
  Filled 2020-12-21 (×2): qty 10

## 2020-12-21 NOTE — Progress Notes (Signed)
Progress Note  Patient Name: Charles [name] Date of Encounter: 12/21/2020  Primary Cardiologist: [name], MD   Subjective   In interval, given 57 my IV Laxis again 12/20/20 with 3 L Diuresis.  With this, HR has improved.  CXR looks improved from prior.  Patient noted good sleep and no chest pain.  Notes his SOB is improved by not back to normal.  Inpatient Medications    Scheduled Meds: . acetaminophen  1,000 mg Oral Q6H   Or  . acetaminophen (TYLENOL) oral liquid 160 mg/5 mL  1,000 mg Oral Q6H  . atorvastatin  10 mg Oral Daily  . bisacodyl  10 mg Oral Daily  . brimonidine  1 drop Both Eyes BID  . Chlorhexidine Gluconate Cloth  6 each Topical Daily  . dapagliflozin propanediol  10 mg Oral Daily  . divalproex  500 mg Oral BID  . enoxaparin (LOVENOX) injection  40 mg Subcutaneous Q24H  . insulin aspart  0-24 Units Subcutaneous TID AC  . latanoprost  1 drop Both Eyes QHS  . levothyroxine  100 mcg Oral QAC breakfast  . melatonin  3 mg Oral QHS  . metoCLOPramide (REGLAN) injection  10 mg Intravenous Q6H  . metoprolol succinate  100 mg Oral Daily  . pantoprazole  40 mg Oral Daily  . polyethylene glycol  17 g Oral BID  . risperiDONE  0.5 mg Oral QHS  . sacubitril-valsartan  1 tablet Oral BID  . senna  2 tablet Oral BID  . senna-docusate  1 tablet Oral QHS  . sertraline  25 mg Oral Daily  . tamsulosin  0.4 mg Oral Daily   Continuous Infusions:  PRN Meds: ipratropium-albuterol, nitroGLYCERIN, ondansetron (ZOFRAN) IV, oxyCODONE, traMADol   Vital Signs    Vitals:   12/21/20 0300 12/21/20 0400 12/21/20 0600 12/21/20 0717  BP: 99/83 111/88 115/81 119/80  Pulse: (!) 108 (!) 107 (!) 104 (!) 110  Resp: (!) 24 (!) 24 20 (!) 26  Temp: 98 F (36.7 C)   97.9 F (36.6 C)  TempSrc: Oral   Oral  SpO2: 92% 95% 94% 96%  Weight:      Height:        Intake/Output Summary (Last 24 hours) at 12/21/2020 1610 Last data filed at 12/20/2020 1930 Gross per 24 hour  Intake 930  ml  Output 4135 ml  Net -3205 ml   Filed Weights   12/18/20 1002  Weight: 114.9 kg    Telemetry    Sinus rhythm rate 110, with improvement of PVCs; presently on telemetry (seen right after bath) - Personally Reviewed  ECG    No new - Personally Reviewed  Physical Exam   GEN: Mild Dyspnea; somnolent thought easily rousable Neck: No JVD, no carotid bruits Cardiac: tachycardic, regular rhythm, no murmurs, rubs, or gallops.  Respiratory: bilateral crackles at lung bases L>R  GI: NABS, Soft, nontender, non-distended  MS: No edema in legs, with +1 sacral edema; No deformity. Neuro:  Nonfocal, moving all extremities spontaneously Psych: Depressed affect   Labs    Chemistry Recent Labs  Lab 12/16/20 1341 12/19/20 0410 12/20/20 0912 12/21/20 0347  NA 133* 135 137 136  K 4.5 5.3* 4.1 4.4  CL 101 101 102 96*  CO2 19* 24 23 28   GLUCOSE 116* 123* 112* 108*  BUN 14 21* 18 19  CREATININE 1.10 1.29* 1.21 1.30*  CALCIUM 9.1 8.9 8.8* 9.0  PROT 7.0  --   --   --  ALBUMIN 3.9  --   --   --   AST 25  --   --   --   ALT 20  --   --   --   ALKPHOS 96  --   --   --   BILITOT 1.0  --   --   --   GFRNONAA >60 >60 >60 >60  ANIONGAP 13 10 12 12      Hematology Recent Labs  Lab 12/16/20 1341 12/19/20 0410 12/20/20 0157  WBC 10.4 10.7* 14.9*  RBC 6.38* 5.80 5.48  HGB 16.2 14.7 13.9  HCT 51.0 46.7 43.6  MCV 79.9* 80.5 79.6*  MCH 25.4* 25.3* 25.4*  MCHC 31.8 31.5 31.9  RDW 18.7* 17.9* 17.2*  PLT 211 164 167    Cardiac EnzymesNo results for input(s): TROPONINI in the last 168 hours. No results for input(s): TROPIPOC in the last 168 hours.   BNPNo results for input(s): BNP, PROBNP in the last 168 hours.   DDimer No results for input(s): DDIMER in the last 168 hours.   Radiology    DG Chest 2 View  Result Date: 12/21/2020 CLINICAL DATA:  55 year old male postoperative day 3 status post left video-assisted thoracoscopy for biopsy of mediastinal lymph nodes. Pulmonary  edema. EXAM: CHEST - 2 VIEW COMPARISON:  Portable chest 12/20/2020 and earlier. FINDINGS: AP and lateral views of the chest today. Stable right IJ central line. Continued low lung volumes. Stable cardiomegaly and mediastinal contours. No pneumothorax. No pleural effusion is evident. Since yesterday perihilar indistinct opacity has regressed and pulmonary vasculature appears improved. Bibasilar streaky opacity favored to be atelectasis. No acute osseous abnormality identified. Negative visible bowel gas pattern. IMPRESSION: 1. Stable right IJ central line. 2. Regressed edema and perihilar opacity since yesterday. On going low lung volumes with atelectasis. Electronically Signed   By: Genevie Ann M.D.   On: 12/21/2020 07:23   DG Chest Port 1 View  Result Date: 12/20/2020 CLINICAL DATA:  Status post chest tube removal EXAM: PORTABLE CHEST 1 VIEW COMPARISON:  December 19, 2020 FINDINGS: Left chest tube is been removed. Central catheter tip is in the superior vena cava. No pneumothorax evident. There is cardiomegaly with pulmonary venous hypertension. There is ill-defined opacity in the right perihilar region as well as areas of interstitial pulmonary edema. No appreciable new opacity. No bone lesions. No adenopathy. IMPRESSION: No evident pneumothorax. Cardiomegaly with pulmonary vascular congestion. There is a degree of interstitial edema. Ill-defined opacity right perihilar region likely represents alveolar edema. The overall appearance suggests a degree of underlying congestive heart failure. Electronically Signed   By: Lowella Grip III M.D.   On: 12/20/2020 08:40    Cardiac Studies   Echocardiogram 10/2020: 1. Left ventricular ejection fraction, by estimation, is 20 to 25%. The  left ventricle has severely decreased function. The left ventricle  demonstrates global hypokinesis. The left ventricular internal cavity size  was severely dilated. Left ventricular  diastolic parameters are indeterminate.    2. Right ventricular systolic function was not well visualized. The right  ventricular size is not well visualized.   3. The mitral valve is normal in structure. No evidence of mitral valve  regurgitation.   4. The aortic valve was not well visualized. Aortic valve regurgitation  is not visualized. No aortic stenosis is present.  R/LHC 10/2020:   LV end diastolic pressure is severely elevated. LVEDP 29 mm Hg.  There is no aortic valve stenosis.  No angiographically apparent CAD.  Ao sat 98%,  PA sat 69%, PA pressure 34/22, mean 26 mm Hg; mean PCWP 26 mm Hg; CO 4.96 L/min; CI 2.09   Continue medical therapy for nonischemic cardiomyopathy.    Patient Profile     55 y.o. male with a PMH of chronic combined CHF/NICM, HTN, HLD, DM type 2, ETOH and cocaine abuse, bipolar disorder/paranoid schizophrenia, hypothyroidism, and possible lymphoma, who is being followed by cardiology for CHF.  Assessment & Plan    1. Acute on chronic combined CHF/NICM:  - K above 4 today, stable creatinine; K goal above 4, Mg goal above 2; 1300 BMP ordered - hypervolemic - Continue dapaglifloxin, Will order Lasix 120 mg IV BID - Will DC PRN Nitro (did not help)  - Continue Entresto, likely do not have room to increase at this time - if hypokalemia occurs, can add aldactone 12.5 mg PO daily; holding off because of prior upper limit of normal K (5.3)  2. Tachycardia:  - Improved- likely a reaction to volume overload - Continue metoprolol (also for HFrEF)  3. DM with HTN:  - Managed in the context of #1; controlled  4. DM with HLD: LDL 116 10/2020 - Continue atorvastatin  Discussed with patient and nursing team  For questions or updates, please contact [place] Please consult [email or web address] for contact info under Cardiology/STEMI.      Signed, Rudean Haskell, [place]  12/21/2020 8:11 AM

## 2020-12-21 NOTE — Progress Notes (Addendum)
      [address]       [place] [address]             [phone]       3 Days Post-Op Procedure(s) (LRB): XI ROBOTIC ASSISTED THORASCOPY FOR BIOPSY AP WINDOW LYMPH NODES (Left) LYMPH NODE BIOPSY (Left) INTERCOSTAL NERVE BLOCK (Left)  Subjective: Patient states breathing is improved but not "normal" yet. He is really "sleepy" this am.  Objective: Vital signs in last 24 hours: Temp:  [97.6 F (36.4 C)-98.3 F (36.8 C)] 97.9 F (36.6 C) (02/13 0717) Pulse Rate:  [104-132] 110 (02/13 0717) Cardiac Rhythm: Sinus tachycardia (02/13 0718) Resp:  [17-34] 26 (02/13 0717) BP: (91-129)/(63-99) 119/80 (02/13 0717) SpO2:  [91 %-96 %] 96 % (02/13 0717)      Intake/Output from previous day: 02/12 0701 - 02/13 0700 In: 2638.9 [P.O.:1390; I.V.:1248.9] Out: 4193 [Urine:4135]   Physical Exam:  Cardiovascular: Tachycardic Pulmonary: Crackles bilaterally Abdomen: Soft, non tender, protuberant, bowel sounds present. Extremities: Trace  bilateral lower extremity edema. Wounds: Clean and dry.  No erythema or signs of infection.   Lab Results: CBC: Recent Labs    12/19/20 0410 12/20/20 0157  WBC 10.7* 14.9*  HGB 14.7 13.9  HCT 46.7 43.6  PLT 164 167   BMET:  Recent Labs    12/20/20 0912 12/21/20 0347  NA 137 136  K 4.1 4.4  CL 102 96*  CO2 23 28  GLUCOSE 112* 108*  BUN 18 19  CREATININE 1.21 1.30*  CALCIUM 8.8* 9.0    PT/INR: No results for input(s): LABPROT, INR in the last 72 hours. ABG:  INR: Will add last result for INR, ABG once components are confirmed Will add last 4 CBG results once components are confirmed  Assessment/Plan:  1. CV - Tachycardic. On Toprol XL 100 mg daily  2.  Pulmonary - On 3 liters of oxygen via . Wean as able. Chcek CXR in am. He has already been given Lasix 80 mg IV. 3. DM-CBGs 173/164/156. On Insulin PRN. 4. History of hypothyroidism-continue Levothyroxine 100 mcg daily 5. Acute on chronic CHF-on Entresto  and Farxiga 10 mg daily. Also, receiving Lasix 120 mg IV bid 6. Will discharge when ok with cardiology  Donielle M ZimmermanPA-C 12/21/2020,10:36 AM [phone]   I have seen and examined the patient and agree with the assessment and plan as outlined.  Probably okay for d/c home  Rexene Alberts, MD 12/21/2020 11:50 AM

## 2020-12-21 NOTE — Hospital Course (Addendum)
Hospital Course: Chest tube was removed on post op day one. Because he was on multiple medications for heart failure, a cardiology consult was obtained. He then developed mid sternal chest pain (tender to palpation) as well as some shortness of breath. He had been on IVF and chest x ray done 02/12 showed pulmonary vascular congestion. IVF were stopped and he was given IV Lasix. BMET was obtained and potassium and Magnesium were WNL. He was already on Toprol XL and Entresto. He was given additional IV Lasix with good diuresis. He was started on Farxiga. His wounds were clean, dry and healing without signs of infection. He was still requiring several liters of oxygen.  The patient's creatinine was slightly elevated at 1.23.  His potassium was 4.4.  It was felt his overall fluid status was improving.  He was transitioned to oral Lasix on 12/23/2020.  His Toprol XL was titrated to 150 mg daily.  Arrangements have been made for patient to return to [facility].  School over to the Cath Lab

## 2020-12-22 DIAGNOSIS — I5023 Acute on chronic systolic (congestive) heart failure: Secondary | ICD-10-CM | POA: Diagnosis not present

## 2020-12-22 DIAGNOSIS — R590 Localized enlarged lymph nodes: Secondary | ICD-10-CM | POA: Diagnosis not present

## 2020-12-22 DIAGNOSIS — I428 Other cardiomyopathies: Secondary | ICD-10-CM | POA: Diagnosis not present

## 2020-12-22 LAB — GLUCOSE, CAPILLARY
Glucose-Capillary: 131 mg/dL — ABNORMAL HIGH (ref 70–99)
Glucose-Capillary: 134 mg/dL — ABNORMAL HIGH (ref 70–99)
Glucose-Capillary: 155 mg/dL — ABNORMAL HIGH (ref 70–99)
Glucose-Capillary: 137 mg/dL — ABNORMAL HIGH (ref 70–99)

## 2020-12-22 LAB — SARS CORONAVIRUS 2 (TAT 6-24 HRS): SARS Coronavirus 2: NEGATIVE

## 2020-12-22 MED ORDER — METOPROLOL SUCCINATE ER 50 MG PO TB24
150.0000 mg | ORAL_TABLET | Freq: Every day | ORAL | Status: DC
  Administered 2020-12-22 – 2020-12-23 (×2): 150 mg via ORAL
  Filled 2020-12-22 (×2): qty 1

## 2020-12-22 MED ORDER — FUROSEMIDE 80 MG PO TABS
80.0000 mg | ORAL_TABLET | Freq: Two times a day (BID) | ORAL | Status: DC
  Administered 2020-12-22 – 2020-12-23 (×2): 80 mg via ORAL
  Filled 2020-12-22 (×3): qty 1

## 2020-12-22 NOTE — Plan of Care (Signed)

## 2020-12-22 NOTE — Progress Notes (Addendum)
      [address]       [place] [address]             [phone]      4 Days Post-Op Procedure(s) (LRB): XI ROBOTIC ASSISTED THORASCOPY FOR BIOPSY AP WINDOW LYMPH NODES (Left) LYMPH NODE BIOPSY (Left) INTERCOSTAL NERVE BLOCK (Left)   Subjective:  No new complaints.  Trying to sleep  Objective: Vital signs in last 24 hours: Temp:  [97.8 F (36.6 C)-99 F (37.2 C)] 98.5 F (36.9 C) (02/14 0400) Pulse Rate:  [105-119] 106 (02/14 0400) Cardiac Rhythm: Sinus tachycardia (02/13 1900) Resp:  [21-27] 21 (02/14 0400) BP: (99-123)/(48-91) 123/80 (02/14 0400) SpO2:  [91 %-96 %] 91 % (02/14 0400) Weight:  [112.5 kg] 112.5 kg (02/14 0400)  Intake/Output from previous day: 02/13 0701 - 02/14 0700 In: 1192.6 [P.O.:1063; I.V.:5.6; IV Piggyback:124] Out: 2275 [Urine:2275]  General appearance: alert, cooperative and no distress Heart: regular rate and rhythm and tachy Lungs: crackles bilaterally Abdomen: soft, non-tender; bowel sounds normal; no masses,  no organomegaly Extremities: edema trace Wound: clean and dry  Lab Results: Recent Labs    12/20/20 0157  WBC 14.9*  HGB 13.9  HCT 43.6  PLT 167   BMET:  Recent Labs    12/21/20 0347 12/21/20 1327  NA 136 136  K 4.4 4.4  CL 96* 95*  CO2 28 31  GLUCOSE 108* 120*  BUN 19 18  CREATININE 1.30* 1.23  CALCIUM 9.0 9.4    PT/INR: No results for input(s): LABPROT, INR in the last 72 hours. ABG    Component Value Date/Time   PHART 7.411 12/16/2020 1401   HCO3 24.0 12/16/2020 1401   TCO2 34 (H) 10/14/2020 1019   ACIDBASEDEF 0.0 12/16/2020 1401   O2SAT 96.9 12/16/2020 1401   CBG (last 3)  Recent Labs    12/21/20 1619 12/21/20 2120 12/22/20 0615  GLUCAP 121* 140* 131*    Assessment/Plan: S/P Procedure(s) (LRB): XI ROBOTIC ASSISTED THORASCOPY FOR BIOPSY AP WINDOW LYMPH NODES (Left) LYMPH NODE BIOPSY (Left) INTERCOSTAL NERVE BLOCK (Left)  1. CV- Sinus Tach, A/C CHF- continue Toprol XL,  Entresto 2. Pulm- weaning oxygen as tolerated, CXR yesterday showed some improvement in edema 3. Renal- creatinine at 1.23, K is at 4.4, weight is trending down being aggressively diuresed by Cardiology 4. Hypothyroidism- on synthroid 5. Dispo- patient stable, volume status is improving, for discharge once HF optimized by Cardiology   LOS: 4 days    Ellwood Handler, PA-C 12/22/2020 Patient seen and examined, agree with above Denies pain. Wean O2 Hopefully can change to PO lasix today. Home when [place] with Cardiology  Remo Lipps C. Roxan Hockey, MD [facility] [phone]

## 2020-12-22 NOTE — Progress Notes (Signed)
Pt from [facility]. He can return today or tomorrow. FL2 and DC summary to be faxed to [phone]. They will not be able to transport pt.

## 2020-12-22 NOTE — Progress Notes (Signed)
Progress Note  Patient Name: Charles [name] Date of Encounter: 12/22/2020  [facility] Cardiologist: [name], MD   Subjective   Patient complains of less shortness of breath.  He is still somewhat tachycardic.  He did have a good diuresis of over 3 L.  Inpatient Medications    Scheduled Meds: . acetaminophen  1,000 mg Oral Q6H   Or  . acetaminophen (TYLENOL) oral liquid 160 mg/5 mL  1,000 mg Oral Q6H  . atorvastatin  10 mg Oral Daily  . bisacodyl  10 mg Oral Daily  . brimonidine  1 drop Both Eyes BID  . Chlorhexidine Gluconate Cloth  6 each Topical Daily  . dapagliflozin propanediol  10 mg Oral Daily  . divalproex  500 mg Oral BID  . enoxaparin (LOVENOX) injection  40 mg Subcutaneous Q24H  . furosemide  80 mg Oral BID  . insulin aspart  0-24 Units Subcutaneous TID AC  . latanoprost  1 drop Both Eyes QHS  . levothyroxine  100 mcg Oral QAC breakfast  . melatonin  3 mg Oral QHS  . metoCLOPramide (REGLAN) injection  10 mg Intravenous Q6H  . metoprolol succinate  150 mg Oral Daily  . pantoprazole  40 mg Oral Daily  . polyethylene glycol  17 g Oral BID  . risperiDONE  0.5 mg Oral QHS  . sacubitril-valsartan  1 tablet Oral BID  . senna  2 tablet Oral BID  . senna-docusate  1 tablet Oral QHS  . sertraline  25 mg Oral Daily  . tamsulosin  0.4 mg Oral Daily   Continuous Infusions:  PRN Meds: ipratropium-albuterol, ondansetron (ZOFRAN) IV, oxyCODONE, traMADol   Vital Signs    Vitals:   12/21/20 1800 12/21/20 2000 12/22/20 0020 12/22/20 0400  BP: 121/88 111/80 (!) 99/48 123/80  Pulse: (!) 106 (!) 106 (!) 105 (!) 106  Resp: (!) 27 (!) 22 (!) 23 (!) 21  Temp:  99 F (37.2 C) 98.6 F (37 C) 98.5 F (36.9 C)  TempSrc:  Oral Oral Oral  SpO2: 96% 92% 92% 91%  Weight:    112.5 kg  Height:        Intake/Output Summary (Last 24 hours) at 12/22/2020 0820 Last data filed at 12/22/2020 0615 Gross per 24 hour  Intake 956.63 ml  Output 2275 ml  Net -1318.37 ml    Last 3 Weights 12/22/2020 12/18/2020 12/16/2020  Weight (lbs) 248 lb 0.3 oz 253 lb 4.8 oz 253 lb 4.8 oz  Weight (kg) 112.5 kg 114.896 kg 114.896 kg      Telemetry    Sinus tachycardia- Personally Reviewed  ECG    Not performed today- Personally Reviewed  Physical Exam   GEN: No acute distress.   Neck: No JVD Cardiac: RRR, no murmurs, rubs, or gallops.  Respiratory:  Occasional scattered crackles GI: Soft, nontender, non-distended  MS: No edema; No deformity. Neuro:  Nonfocal  Psych: Normal affect  Extremities-no peripheral edema  Labs    High Sensitivity Troponin:   Recent Labs  Lab 11/29/20 1745 11/29/20 2004 12/20/20 0912 12/20/20 1304  TROPONINIHS 14 15 29* 34*      Chemistry Recent Labs  Lab 12/16/20 1341 12/19/20 0410 12/20/20 0912 12/21/20 0347 12/21/20 1327  NA 133*   < > 137 136 136  K 4.5   < > 4.1 4.4 4.4  CL 101   < > 102 96* 95*  CO2 19*   < > 23 28 31   GLUCOSE 116*   < >  112* 108* 120*  BUN 14   < > 18 19 18   CREATININE 1.10   < > 1.21 1.30* 1.23  CALCIUM 9.1   < > 8.8* 9.0 9.4  PROT 7.0  --   --   --   --   ALBUMIN 3.9  --   --   --   --   AST 25  --   --   --   --   ALT 20  --   --   --   --   ALKPHOS 96  --   --   --   --   BILITOT 1.0  --   --   --   --   GFRNONAA >60   < > >60 >60 >60  ANIONGAP 13   < > 12 12 10    < > = values in this interval not displayed.     Hematology Recent Labs  Lab 12/16/20 1341 12/19/20 0410 12/20/20 0157  WBC 10.4 10.7* 14.9*  RBC 6.38* 5.80 5.48  HGB 16.2 14.7 13.9  HCT 51.0 46.7 43.6  MCV 79.9* 80.5 79.6*  MCH 25.4* 25.3* 25.4*  MCHC 31.8 31.5 31.9  RDW 18.7* 17.9* 17.2*  PLT 211 164 167    BNPNo results for input(s): BNP, PROBNP in the last 168 hours.   DDimer No results for input(s): DDIMER in the last 168 hours.   Radiology    DG Chest 2 View  Result Date: 12/21/2020 CLINICAL DATA:  54 year old male postoperative day 3 status post left video-assisted thoracoscopy for biopsy of  mediastinal lymph nodes. Pulmonary edema. EXAM: CHEST - 2 VIEW COMPARISON:  Portable chest 12/20/2020 and earlier. FINDINGS: AP and lateral views of the chest today. Stable right IJ central line. Continued low lung volumes. Stable cardiomegaly and mediastinal contours. No pneumothorax. No pleural effusion is evident. Since yesterday perihilar indistinct opacity has regressed and pulmonary vasculature appears improved. Bibasilar streaky opacity favored to be atelectasis. No acute osseous abnormality identified. Negative visible bowel gas pattern. IMPRESSION: 1. Stable right IJ central line. 2. Regressed edema and perihilar opacity since yesterday. On going low lung volumes with atelectasis. Electronically Signed   By: Genevie Ann M.D.   On: 12/21/2020 07:23    Cardiac Studies   None  Patient Profile     55 y.o. male with a PMH of chronic combined CHF/NICM, HTN, HLD, DM type 2, ETOH and cocaine abuse, bipolar disorder/paranoid schizophrenia, hypothyroidism, and possible lymphoma, who is being followed by cardiology for CHF.  Assessment & Plan    1: Acute on chronic combined systolic and diastolic heart failure-nonischemic cardiomyopathy-patient with known clean coronary arteries by cath in December.  His EF is in the 20 to 25% range.  He was admitted 4 days ago and is postop day 4 thoracic surgery.  He was on 40 mg of furosemide at home as well as spironolactone with borderline elevated potassium.  Is also on digoxin.  He was given IV furosemide 120 mg IV twice daily with a 3 L diuresis.  He is also on Toprol-XL 100 with a heart rate in the low 100 range.  He is on low-dose Entresto as well.  I am going to decrease his diuretics to furosemide 80 mg p.o. twice daily.  I am going to increase his Toprol-XL to 150 mg a day because of his resting tachycardia.  His chest x-ray performed yesterday showed clearing interstitial edema.  2: Diabetes-per primary team  3: Hyperlipidemia-continue statin  therapy  Patient close to being discharged.  Will change IV to p.o. diuretics.  He was only on furosemide 40 mg a day at home as well as spironolactone.  We will get physical therapy and/or cardiac rehab to ambulate.  Potentially ready for discharge tomorrow from cardiac point of view with close outpatient follow-up.      For questions or updates, please contact [place] Please consult [email or web address] for contact info under        Signed, Quay Burow, MD  12/22/2020, 8:20 AM

## 2020-12-22 NOTE — Progress Notes (Signed)
CARDIAC REHAB PHASE I   PRE:  Rate/Rhythm: 102 ST    BP: sitting 94/73    SaO2: 94 RA  MODE:  Ambulation: 100 ft   POST:  Rate/Rhythm: 111 ST with PVCs    BP: sitting 91/53     SaO2: 94 RA  Pt very sleepy on arrival. Willing to ambulate and able to move to EOB. C/o lightheadedness once up. Used his rollator with slow pace. Still c/o lightheadedness and BP low therefore did not push distance. To recliner. Reminded pt of importance of low sodium snacks at home. Will f/u as times allows. [place], ACSM 12/22/2020 2:40 PM

## 2020-12-22 NOTE — Progress Notes (Signed)
Weaned off oxygen to room air with pulse ox- 91 %. Continue to monitor.

## 2020-12-23 ENCOUNTER — Other Ambulatory Visit (HOSPITAL_COMMUNITY): Payer: Self-pay | Admitting: Surgical

## 2020-12-23 DIAGNOSIS — I255 Ischemic cardiomyopathy: Secondary | ICD-10-CM

## 2020-12-23 LAB — BASIC METABOLIC PANEL
Anion gap: 13 (ref 5–15)
BUN: 21 mg/dL — ABNORMAL HIGH (ref 6–20)
CO2: 30 mmol/L (ref 22–32)
Calcium: 9.1 mg/dL (ref 8.9–10.3)
Chloride: 92 mmol/L — ABNORMAL LOW (ref 98–111)
Creatinine, Ser: 1.09 mg/dL (ref 0.61–1.24)
GFR, Estimated: >60 mL/min (ref >60)
Glucose, Bld: 101 mg/dL — ABNORMAL HIGH (ref 70–99)
Potassium: 3.5 mmol/L (ref 3.5–5.1)
Sodium: 135 mmol/L (ref 135–145)

## 2020-12-23 LAB — AEROBIC/ANAEROBIC CULTURE W GRAM STAIN (SURGICAL/DEEP WOUND): Gram Stain: NONE SEEN

## 2020-12-23 LAB — GLUCOSE, CAPILLARY
Glucose-Capillary: 112 mg/dL — ABNORMAL HIGH (ref 70–99)
Glucose-Capillary: 135 mg/dL — ABNORMAL HIGH (ref 70–99)
Glucose-Capillary: 192 mg/dL — ABNORMAL HIGH (ref 70–99)
Glucose-Capillary: 173 mg/dL — ABNORMAL HIGH (ref 70–99)

## 2020-12-23 LAB — MAGNESIUM: Magnesium: 2.3 mg/dL (ref 1.7–2.4)

## 2020-12-23 MED ORDER — TRAMADOL HCL 50 MG PO TABS: 50.0000 mg | ORAL_TABLET | Freq: Two times a day (BID) | ORAL | 0 refills | Status: DC | PRN

## 2020-12-23 MED ORDER — POTASSIUM CHLORIDE CRYS ER 20 MEQ PO TBCR
40.0000 meq | EXTENDED_RELEASE_TABLET | Freq: Two times a day (BID) | ORAL | Status: AC
  Administered 2020-12-23 (×2): 40 meq via ORAL
  Filled 2020-12-23 (×2): qty 2

## 2020-12-23 MED ORDER — POTASSIUM CHLORIDE CRYS ER 20 MEQ PO TBCR: 40.0000 meq | EXTENDED_RELEASE_TABLET | Freq: Every day | ORAL | 1 refills | Status: DC

## 2020-12-23 MED ORDER — POTASSIUM CHLORIDE CRYS ER 20 MEQ PO TBCR: 40.0000 meq | EXTENDED_RELEASE_TABLET | Freq: Once | ORAL | Status: DC

## 2020-12-23 MED ORDER — FUROSEMIDE 80 MG PO TABS: 80.0000 mg | ORAL_TABLET | Freq: Two times a day (BID) | ORAL | 1 refills | Status: DC

## 2020-12-23 MED ORDER — METOPROLOL SUCCINATE ER 50 MG PO TB24: 150.0000 mg | ORAL_TABLET | Freq: Every day | ORAL | 1 refills | Status: DC

## 2020-12-23 MED FILL — POTASSIUM CHLORIDE 20meqER: 20 | 30 days supply | Qty: 60 | Fill #0

## 2020-12-23 MED FILL — FUROSEMIDE 80 MG TAB: 80 | 30 days supply | Qty: 60 | Fill #0

## 2020-12-23 MED FILL — traMADol HCL 50 MG TABS: 50 | 3 days supply | Qty: 12 | Fill #0

## 2020-12-23 NOTE — Progress Notes (Signed)
Got a call from TCTS PA to cancell discharge today due to low BP. Pt made aware.

## 2020-12-23 NOTE — Progress Notes (Signed)
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 No order given.

## 2020-12-23 NOTE — Progress Notes (Addendum)
[address]       [employer] [address]             [phone]      5 Days Post-Op Procedure(s) (LRB): XI ROBOTIC ASSISTED THORASCOPY FOR BIOPSY AP WINDOW LYMPH NODES (Left) LYMPH NODE BIOPSY (Left) INTERCOSTAL NERVE BLOCK (Left) Subjective: Lightheaded with ambulation  Objective: Vital signs in last 24 hours: Temp:  [98.4 F (36.9 C)-98.9 F (37.2 C)] 98.5 F (36.9 C) (02/15 0412) Pulse Rate:  [102-113] 102 (02/15 0412) Cardiac Rhythm: Normal sinus rhythm (02/15 0400) Resp:  [20-22] 20 (02/15 0412) BP: (100-105)/(66-79) 102/79 (02/15 0412) SpO2:  [91 %-99 %] 99 % (02/15 0412) Weight:  [111.7 kg] 111.7 kg (02/15 0412)  Hemodynamic parameters for last 24 hours:    Intake/Output from previous day: 02/14 0701 - 02/15 0700 In: 797 [P.O.:797] Out: 850 [Urine:850] Intake/Output this shift: No intake/output data recorded.  General appearance: alert, cooperative and no distress Heart: regular rate and rhythm Lungs: dim L>R lower fields Abdomen: benign Extremities: no edema Wound: incis healing well  Lab Results: No results for input(s): WBC, HGB, HCT, PLT in the last 72 hours. BMET: Recent Labs    12/21/20 1327 12/23/20 0442  NA 136 135  K 4.4 3.5  CL 95* 92*  CO2 31 30  GLUCOSE 120* 101*  BUN 18 21*  CREATININE 1.23 1.09  CALCIUM 9.4 9.1    PT/INR: No results for input(s): LABPROT, INR in the last 72 hours. ABG    Component Value Date/Time   PHART 7.411 12/16/2020 1401   HCO3 24.0 12/16/2020 1401   TCO2 34 (H) 10/14/2020 1019   ACIDBASEDEF 0.0 12/16/2020 1401   O2SAT 96.9 12/16/2020 1401   CBG (last 3)  Recent Labs    12/22/20 1621 12/22/20 2132 12/23/20 0528  GLUCAP 155* 137* 112*    Meds Scheduled Meds: . acetaminophen  1,000 mg Oral Q6H   Or  . acetaminophen (TYLENOL) oral liquid 160 mg/5 mL  1,000 mg Oral Q6H  . atorvastatin  10 mg Oral Daily  . bisacodyl  10 mg Oral Daily  . brimonidine  1 drop Both Eyes BID  .  Chlorhexidine Gluconate Cloth  6 each Topical Daily  . dapagliflozin propanediol  10 mg Oral Daily  . divalproex  500 mg Oral BID  . enoxaparin (LOVENOX) injection  40 mg Subcutaneous Q24H  . furosemide  80 mg Oral BID  . insulin aspart  0-24 Units Subcutaneous TID AC  . latanoprost  1 drop Both Eyes QHS  . levothyroxine  100 mcg Oral QAC breakfast  . melatonin  3 mg Oral QHS  . metoCLOPramide (REGLAN) injection  10 mg Intravenous Q6H  . metoprolol succinate  150 mg Oral Daily  . pantoprazole  40 mg Oral Daily  . polyethylene glycol  17 g Oral BID  . risperiDONE  0.5 mg Oral QHS  . sacubitril-valsartan  1 tablet Oral BID  . senna  2 tablet Oral BID  . senna-docusate  1 tablet Oral QHS  . sertraline  25 mg Oral Daily  . tamsulosin  0.4 mg Oral Daily   Continuous Infusions: PRN Meds:.ipratropium-albuterol, ondansetron (ZOFRAN) IV, oxyCODONE, traMADol  Xrays No results found.  Assessment/Plan: S/P Procedure(s) (LRB): XI ROBOTIC ASSISTED THORASCOPY FOR BIOPSY AP WINDOW LYMPH NODES (Left) LYMPH NODE BIOPSY (Left) INTERCOSTAL NERVE BLOCK (Left)  1 afeb, VSS, sinus with PVC's 2 sats good on RA, some desats 3 creat ok, K+ 3.5  will supplement  4 BS adeq controoled 5 path pending 6 to ALF when cardiol thinks CHF is stable for d/c, currently on farxiga,po  lasix and entresto     LOS: 5 days    John Giovanni PA-C Pager [phone] 12/23/2020 Patient seen and examined, agree with above Hopefully can go to [facility] later today  Remo Lipps C. Roxan Hockey, MD [facility] [phone]

## 2020-12-23 NOTE — Progress Notes (Signed)
Heart Failure Nurse Navigator Progress Note  PCP: Patient, No Pcp Per PCP-Cardiologist: Harrell Gave, MD Admission Diagnosis: Mediastinal window adenopathy  Ad[facility] from[facility]  Presentation:   [name] presented for surgical intervention AP window adenopathy. Pt lives at [facility], difficult to assess as pt is a poor historian. Significant psych history, CHF, DM2, MI, HTN. States the facility orders, organizes and gives him his meds. Pt states he has not been taking his bipolar/schizophrenia medications for over 7 months because he ran out, I was unable to reach anyone at the ALF to confirm care/reason for missing medications. Pt states facility will assist with transportation.  Referral from cardiac rehab for need of scale at home-appreciated. Navigator able to speak with Ms. Shanda Bumps, administration at ALF, per her statement, the facility takes responsibility for finances regarding their fees, the patient is responsible for paying his own personal finances-phone, car, etc. Ms. Shanda Bumps states Mr. Bedoy does not have a DPOA or legal guardian. Per Ms. Shanda Bumps, the patient had been taking all medications as prescribed, including his psych meds. He is compliant when medications are given. He does not walk much, uses his rollator seat to scoot around, not as a walker. Mr. Hargens has lived at this ALF for about 1.5 months. Navigator made Ms. Forman aware of HV TOC appt Monday, in which the ALF will provide transportation.   ECHO/ LVEF: 20-25% (12/21)  Clinical Course:  Past Medical History:  Diagnosis Date  . Arthritis    lower back  . Bipolar 1 disorder ([place])   . Cardiomyopathy, nonischemic ([place])    no CAD 10/14/20 cath, EF 20-25% by 10/11/20 echo  . CHF (congestive heart failure) ([place])   . COVID 10/2020  . Depression   . DM2 (diabetes mellitus, type 2) ([place])   . HTN (hypertension)   . Myocardial infarction [facility])      Social History   Socioeconomic History  . Marital status: Divorced    Spouse  name: Not on file  . Number of children: 1  . Years of education: Not on file  . Highest education level: Not on file  Occupational History  . Not on file  Tobacco Use  . Smoking status: Current Every Day Smoker    Packs/day: 0.25    Types: Cigarettes  . Smokeless tobacco: Never Used  . Tobacco comment: occasionally  Vaping Use  . Vaping Use: Never used  Substance and Sexual Activity  . Alcohol use: Never  . Drug use: Not Currently    Types: Cocaine  . Sexual activity: Not Currently  Other Topics Concern  . Not on file  Social History Narrative  . Not on file   Social Determinants of Health   Financial Resource Strain: Medium Risk  . Difficulty of Paying Living Expenses: Somewhat hard  Food Insecurity: No Food Insecurity  . Worried About Charity fundraiser in the Last Year: Never true  . Ran Out of Food in the Last Year: Never true  Transportation Needs: No Transportation Needs  . Lack of Transportation (Medical): No  . Lack of Transportation (Non-Medical): No  Physical Activity: Unknown  . Days of Exercise per Week: 0 days  . Minutes of Exercise per Session: Not on file  Stress: Not on file  Social Connections: Not on file  Encouraged smoking cessation.  High Risk Criteria for Readmission and/or Poor Patient Outcomes:  Heart failure hospital admissions (last 6 months): 2   No Show rate: 4%  Difficult social situation: yes  Demonstrates medication adherence: no  Primary Language: English  Literacy level: Pt able to read/write, comprehension concern for adequate & safe self care. Learns best by hearing information.  Education Assessment and Provision:  Detailed education and instructions provided on heart failure disease management including the following:  Signs and symptoms of Heart Failure When to call the physician Importance of daily weights Low sodium diet Fluid restriction Medication management Anticipated future follow-up  appointments  Patient education given on each of the above topics.  Patient acknowledges understanding via teach back method and acceptance of all instructions.  Education Materials:  "Living Better With Heart Failure" Booklet, HF zone tool, & Daily Weight Tracker Tool.  Patient has scale at home: no, given from AHF Patient has pill box at home: n/a, from ALF-meds provided by staff.    Barriers of Care:   -cognitive ability -functional level  Considerations/Referrals:   Referral made to Heart Failure Pharmacist Stewardship: yes, to see at [facility] TOC appt. Referral made to Heart & Vascular TOC clinic: yes, Monday 2/21 @ 2pm  Items for Follow-up on DC/TOC: -medication regimen

## 2020-12-23 NOTE — Progress Notes (Signed)
Progress Note  Patient Name: Charles [name] Date of Encounter: 12/23/2020  [facility] Cardiologist: [name], MD   Subjective   Patient complains of less shortness of breath.  He is still somewhat tachycardic.  He did have a good diuresis of 2.6 L.  Inpatient Medications    Scheduled Meds: . acetaminophen  1,000 mg Oral Q6H   Or  . acetaminophen (TYLENOL) oral liquid 160 mg/5 mL  1,000 mg Oral Q6H  . atorvastatin  10 mg Oral Daily  . bisacodyl  10 mg Oral Daily  . brimonidine  1 drop Both Eyes BID  . Chlorhexidine Gluconate Cloth  6 each Topical Daily  . dapagliflozin propanediol  10 mg Oral Daily  . divalproex  500 mg Oral BID  . enoxaparin (LOVENOX) injection  40 mg Subcutaneous Q24H  . furosemide  80 mg Oral BID  . insulin aspart  0-24 Units Subcutaneous TID AC  . latanoprost  1 drop Both Eyes QHS  . levothyroxine  100 mcg Oral QAC breakfast  . melatonin  3 mg Oral QHS  . metoCLOPramide (REGLAN) injection  10 mg Intravenous Q6H  . metoprolol succinate  150 mg Oral Daily  . pantoprazole  40 mg Oral Daily  . polyethylene glycol  17 g Oral BID  . potassium chloride  40 mEq Oral BID  . risperiDONE  0.5 mg Oral QHS  . sacubitril-valsartan  1 tablet Oral BID  . senna  2 tablet Oral BID  . senna-docusate  1 tablet Oral QHS  . sertraline  25 mg Oral Daily  . tamsulosin  0.4 mg Oral Daily   Continuous Infusions:  PRN Meds: ipratropium-albuterol, ondansetron (ZOFRAN) IV, oxyCODONE, traMADol   Vital Signs    Vitals:   12/22/20 2000 12/23/20 0004 12/23/20 0412 12/23/20 0747  BP: 101/79 105/66 102/79 99/60  Pulse: (!) 113 (!) 106 (!) 102 (!) 106  Resp: (!) 22 (!) 22 20 (!) 35  Temp: 98.9 F (37.2 C) 98.4 F (36.9 C) 98.5 F (36.9 C) 98.2 F (36.8 C)  TempSrc: Oral Oral Oral Oral  SpO2: 95% 93% 99% 98%  Weight:   111.7 kg   Height:        Intake/Output Summary (Last 24 hours) at 12/23/2020 0912 Last data filed at 12/23/2020 0748 Gross per 24 hour   Intake 1393 ml  Output 950 ml  Net 443 ml   Last 3 Weights 12/23/2020 12/22/2020 12/18/2020  Weight (lbs) 246 lb 4.1 oz 248 lb 0.3 oz 253 lb 4.8 oz  Weight (kg) 111.7 kg 112.5 kg 114.896 kg      Telemetry    Sinus tachycardia- Personally Reviewed  ECG    Not performed today- Personally Reviewed  Physical Exam   GEN: No acute distress.   Neck: No JVD Cardiac: RRR, no murmurs, rubs, or gallops.  Respiratory:  Occasional scattered crackles GI: Soft, nontender, non-distended  MS: No edema; No deformity. Neuro:  Nonfocal  Psych: Normal affect  Extremities-no peripheral edema  Labs    High Sensitivity Troponin:   Recent Labs  Lab 11/29/20 1745 11/29/20 2004 12/20/20 0912 12/20/20 1304  TROPONINIHS 14 15 29* 34*      Chemistry Recent Labs  Lab 12/16/20 1341 12/19/20 0410 12/21/20 0347 12/21/20 1327 12/23/20 0442  NA 133*   < > 136 136 135  K 4.5   < > 4.4 4.4 3.5  CL 101   < > 96* 95* 92*  CO2 19*   < >  28 31 30   GLUCOSE 116*   < > 108* 120* 101*  BUN 14   < > 19 18 21*  CREATININE 1.10   < > 1.30* 1.23 1.09  CALCIUM 9.1   < > 9.0 9.4 9.1  PROT 7.0  --   --   --   --   ALBUMIN 3.9  --   --   --   --   AST 25  --   --   --   --   ALT 20  --   --   --   --   ALKPHOS 96  --   --   --   --   BILITOT 1.0  --   --   --   --   GFRNONAA >60   < > >60 >60 >60  ANIONGAP 13   < > 12 10 13    < > = values in this interval not displayed.     Hematology Recent Labs  Lab 12/16/20 1341 12/19/20 0410 12/20/20 0157  WBC 10.4 10.7* 14.9*  RBC 6.38* 5.80 5.48  HGB 16.2 14.7 13.9  HCT 51.0 46.7 43.6  MCV 79.9* 80.5 79.6*  MCH 25.4* 25.3* 25.4*  MCHC 31.8 31.5 31.9  RDW 18.7* 17.9* 17.2*  PLT 211 164 167    BNPNo results for input(s): BNP, PROBNP in the last 168 hours.   DDimer No results for input(s): DDIMER in the last 168 hours.   Radiology    No results found.  Cardiac Studies   None  Patient Profile     55 y.o. male with a PMH of chronic  combined CHF/NICM, HTN, HLD, DM type 2, ETOH and cocaine abuse, bipolar disorder/paranoid schizophrenia, hypothyroidism, and possible lymphoma, who is being followed by cardiology for CHF.  Assessment & Plan    1: Acute on chronic combined systolic and diastolic heart failure-nonischemic cardiomyopathy-patient with known clean coronary arteries by cath in December.  His EF is in the 20 to 25% range.  He was admitted 4 days ago and is postop day 5 thoracic surgery.  He was on 40 mg of furosemide at home as well as spironolactone with borderline elevated potassium.  Is also on digoxin.  He was given IV furosemide 120 mg IV twice daily with a 3 L diuresis.  He is also on Toprol-XL 100 with a heart rate in the low 100 range.  He is on low-dose Entresto as well.  I decreased his furosemide from 120 mg IV twice daily to 80 mg p.o. twice daily and increase his Toprol-XL to 150 mg a day because of tachycardia.  He appears euvolemic on exam.  He is -2.6 L.  He is mildly hypokalemic with a K of 3.5 which is being repleted.   2: Diabetes-per primary team  3: Hyperlipidemia-continue statin therapy  [name] being discharged today per Dr. Roxan Hockey.  We will arrange outpatient follow-up with Dr. Harrell Gave.      For questions or updates, please contact [place] Please consult [email or web address] for contact info under        Signed, Quay Burow, MD  12/23/2020, 9:12 AM

## 2020-12-23 NOTE — Progress Notes (Signed)
Transition of care pharmacy delivered meds. at bedside.

## 2020-12-23 NOTE — Progress Notes (Addendum)
CARDIAC REHAB PHASE I   PRE:  Rate/Rhythm: 95 SR    BP: lying 77/55, 83/48, sitting 87/73, 82/68    SaO2: 89-94 RA  MODE:  Ambulation: 80 ft   POST:  Rate/Rhythm: 112 ST    BP: sitting 75/48     SaO2: 96 RA  Pt in bed, BP low. Denies dizziness and willing to walk. Took BP sitting with slight increase. Slow and steady ambulation with gait belt and his rollator. Felt weak after 40 feet and asked to sit. BP 85/64 in hall sitting. Able to walk back to room and to recliner, BP 75/48. Pt sts he just feels weak and tired. RN present.  Pt is asking for glucose strips. Sts his ALF is out. [id number]  [place], ACSM 12/23/2020 11:29 AM

## 2020-12-24 ENCOUNTER — Other Ambulatory Visit (HOSPITAL_COMMUNITY): Payer: Self-pay | Admitting: Physician Assistant

## 2020-12-24 ENCOUNTER — Inpatient Hospital Stay: Payer: Medicaid Other

## 2020-12-24 ENCOUNTER — Ambulatory Visit (HOSPITAL_COMMUNITY): Payer: Medicaid Other | Admitting: Licensed Clinical Social Worker

## 2020-12-24 ENCOUNTER — Inpatient Hospital Stay: Payer: Medicaid Other | Admitting: Hematology and Oncology

## 2020-12-24 DIAGNOSIS — J810 Acute pulmonary edema: Secondary | ICD-10-CM

## 2020-12-24 DIAGNOSIS — J811 Chronic pulmonary edema: Secondary | ICD-10-CM

## 2020-12-24 LAB — GLUCOSE, CAPILLARY
Glucose-Capillary: 104 mg/dL — ABNORMAL HIGH (ref 70–99)
Glucose-Capillary: 147 mg/dL — ABNORMAL HIGH (ref 70–99)

## 2020-12-24 LAB — BASIC METABOLIC PANEL
Anion gap: 14 (ref 5–15)
BUN: 26 mg/dL — ABNORMAL HIGH (ref 6–20)
CO2: 28 mmol/L (ref 22–32)
Calcium: 9.3 mg/dL (ref 8.9–10.3)
Chloride: 94 mmol/L — ABNORMAL LOW (ref 98–111)
Creatinine, Ser: 1.23 mg/dL (ref 0.61–1.24)
GFR, Estimated: >60 mL/min (ref >60)
Glucose, Bld: 105 mg/dL — ABNORMAL HIGH (ref 70–99)
Potassium: 4 mmol/L (ref 3.5–5.1)
Sodium: 136 mmol/L (ref 135–145)

## 2020-12-24 MED ORDER — METOPROLOL SUCCINATE ER 100 MG PO TB24: 100.0000 mg | ORAL_TABLET | Freq: Every day | ORAL | 2 refills | Status: DC

## 2020-12-24 MED ORDER — METOPROLOL SUCCINATE ER 100 MG PO TB24
100.0000 mg | ORAL_TABLET | Freq: Every day | ORAL | Status: DC
  Administered 2020-12-24: 100 mg via ORAL
  Filled 2020-12-24: qty 1

## 2020-12-24 MED FILL — METOPROLOL SUCCINATE ER 100: 100 | 30 days supply | Qty: 30 | Fill #0

## 2020-12-24 NOTE — Progress Notes (Signed)
CARDIAC REHAB PHASE I   PRE:  Rate/Rhythm: 101 ST    BP: sitting 94/69    SaO2: 97 RA  MODE:  Ambulation: 130 ft   POST:  Rate/Rhythm: 117 ST    BP: sitting 100/77     SaO2: 98 RA  Pt feeling better. Ambulated without dizziness or needing to sit. Sts his legs feel stronger. To recliner. Discussed HF booklet with emphasis on daily wts. Pt knows to weigh in am after urinating but it was difficult for him to retain comparing weights to day before and understanding weight gain. Encouraged him to review booklet. Discussed signs of fluid which he seemed to understand more.  [phone]   [place], ACSM 12/24/2020 12:05 PM

## 2020-12-24 NOTE — TOC Transition Note (Signed)
Transition of Care [facility]) - CM/SW Discharge Note   Patient Details  Name: Charles [name] MRN: [id number] Date of Birth: 07-Jan-1966  [facility] CM/SW Contact:  Bethann Berkshire, [place] Phone Number: 12/24/2020, 10:21 AM   Clinical Narrative:    CSW confirmed with ALF that pt to return today. CSW faxed FL2 and DC summary. Pt had mentioned to Cardiac Rehab that ALF was out of glucose strips. CSW confirmed with ALF that they do have glucose strips. Pt to transport via [facility]  Patient will DC to: [facility] Anticipated DC date: 12/24/20 Transport by: Corey Harold   Per MD patient ready for DC to [facility]. RN, patient,  and facility notified of DC. Discharge Summary and FL2 sent to facility. RN to call report prior to discharge [phone]). DC packet on chart. Ambulance transport requested for patient.   CSW will sign off for now as social work intervention is no longer needed. Please consult [place] again if new needs arise.    Final next level of care: Assisted Living Barriers to Discharge: No Barriers Identified   Patient Goals and CMS Choice        Discharge Placement              Patient chooses bed at:  ([facility]) Patient to be transferred to facility by: [facility]   Patient and family notified of of transfer: 12/24/20  Discharge Plan and [facility]                                 Social Determinants of Health (SDOH) Interventions Food Insecurity Interventions: Intervention Not Indicated Housing Interventions: Intervention Not Indicated Physical Activity Interventions: Other (Comments) (previous back surgery made patients BLE weak, utilizes rollator.) Transportation Interventions: Intervention Not Indicated (facility provides transportation) Alcohol Brief Interventions/Follow-up: AUDIT Score <7 follow-up not indicated   Readmission Risk Interventions No flowsheet data  found.

## 2020-12-24 NOTE — NC FL2 (Addendum)
[place] LEVEL OF CARE SCREENING TOOL     IDENTIFICATION  Patient Name: Charles [name] Birthdate: 09-24-1966 Sex: male Admission Date (Current Location): 12/18/2020  [place] and [place] Number:  Kathleen Argue [phone] [place] and Address:  [facility] [place] [address] [place], [place] [address]      Provider Number: [id number]  Attending Physician Name and Address:  Melrose Nakayama, MD  Relative Name and Phone Number:  Marzetta Merino (Friend)   [phone] [facility])    Current Level of Care: Hospital Recommended Level of Care: [place] Prior Approval Number:    Date Approved/Denied:   PASRR Number:    Discharge Plan: Other (Comment) (ALF)    Current Diagnoses: Patient Active Problem List   Diagnosis Date Noted  . Mediastinal adenopathy 12/18/2020  . Adjustment disorder with mixed anxiety and depressed mood 10/30/2020  . Acute systolic heart failure ([place])   . Mediastinal lymphadenopathy 10/11/2020  . Shortness of breath 10/11/2020  . Sinus tachycardia 10/11/2020  . Night sweats 10/11/2020  . DM2 (diabetes mellitus, type 2) ([place]) 10/11/2020  . Lymphoma ([place]) 10/11/2020  . Hypertension 09/25/2020  . BPH (benign prostatic hyperplasia) 09/25/2020  . Insomnia 09/25/2020    Orientation RESPIRATION BLADDER Height & Weight     Self,Time,Situation,Place  Normal Continent Weight: 246 lb 4.1 oz (111.7 kg) Height:  5' 9"  (175.3 cm)  BEHAVIORAL SYMPTOMS/MOOD NEUROLOGICAL BOWEL NUTRITION STATUS      Continent Diet (Normal diet)  AMBULATORY STATUS COMMUNICATION OF NEEDS Skin   Supervision Verbally Surgical wounds (Incision; chest)                       Personal Care Assistance Level of Assistance  Bathing,Feeding,Dressing Bathing Assistance: Limited assistance Feeding assistance: Independent Dressing Assistance: Independent     Functional Limitations Info  Sight,Speech,Hearing Sight Info: Adequate Hearing  Info: Adequate Speech Info: Adequate    SPECIAL CARE FACTORS FREQUENCY                       Contractures Contractures Info: Not present    Additional Factors Info  Code Status,Allergies,Insulin Sliding Scale Code Status Info: FULL code Allergies Info: Ibuprofen, penecillins   Insulin Sliding Scale Info: insulin aspart (novoLOG) injection 0-24 Units        Discharge Medications: STOP taking these medications   digoxin 0.125 MG tablet Commonly known as: LANOXIN   furosemide 40 MG tablet Commonly known as: LASIX   spironolactone 25 MG tablet Commonly known as: ALDACTONE     TAKE these medications   acetaminophen 325 MG tablet Commonly known as: TYLENOL Take 650 mg by mouth every 6 (six) hours as needed for moderate pain.   atorvastatin 10 MG tablet Commonly known as: LIPITOR Take 10 mg by mouth daily.   blood glucose meter kit and supplies Kit Dispense based on patient and insurance preference. Use up to four times daily as directed. (FOR ICD-9 250.00, 250.01).   brimonidine 0.2 % ophthalmic solution Commonly known as: ALPHAGAN Place 1 drop into both eyes 2 (two) times daily.   cyclobenzaprine 10 MG tablet Commonly known as: FLEXERIL Take 10 mg by mouth 3 (three) times daily.   dapagliflozin propanediol 10 MG Tabs tablet Commonly known as: FARXIGA Take 1 tablet (10 mg total) by mouth daily.   divalproex 500 MG DR tablet Commonly known as: DEPAKOTE Take 1 tablet (500 mg total) by mouth 2 (two) times daily.  insulin detemir 100 UNIT/ML FlexPen Commonly known as: LEVEMIR Inject 25 Units into the skin daily.   INVEGA SUSTENNA IM Inject 1 pen into the muscle every 30 (thirty) days.   latanoprost 0.005 % ophthalmic solution Commonly known as: XALATAN Place 1 drop into both eyes at bedtime.   levothyroxine 100 MCG tablet Commonly known as: SYNTHROID Take 100 mcg by mouth daily before breakfast.   melatonin 3 MG Tabs tablet Take  3 mg by mouth at bedtime.   metoprolol succinate 100 MG 24 hr tablet Commonly known as: TOPROL-XL Take 1 tablet (100 mg total) by mouth daily. Take with or immediately following a meal. Start taking on: December 25, 2020 What changed: Another medication with the same name was removed. Continue taking this medication, and follow the directions you see here.   PEN NEEDLES 29GX1/2" 29G X 12MM Misc 1 each by Does not apply route daily.   polyethylene glycol 17 g packet Commonly known as: MIRALAX / GLYCOLAX Take 17 g by mouth 2 (two) times daily.   potassium chloride SA 20 MEQ tablet Commonly known as: KLOR-CON Take 2 tablets (40 mEq total) by mouth daily.   risperiDONE 0.5 MG tablet Commonly known as: RISPERDAL Take 1 tablet (0.5 mg total) by mouth at bedtime.   sacubitril-valsartan 24-26 MG Commonly known as: ENTRESTO Take 1 tablet by mouth 2 (two) times daily.   senna 8.6 MG Tabs tablet Commonly known as: SENOKOT Take 2 tablets (17.2 mg total) by mouth 2 (two) times daily.   sertraline 25 MG tablet Commonly known as: ZOLOFT Take 1 tablet (25 mg total) by mouth daily.   tamsulosin 0.4 MG Caps capsule Commonly known as: FLOMAX Take 0.4 mg by mouth daily.   traMADol 50 MG tablet Commonly known as: ULTRAM Take 1-2 tablets (50-100 mg total) by mouth every 12 (twelve) hours as needed (mild pain).       Relevant Imaging Results:  Relevant Lab Results:   Additional Information SSN: [id number]  Bethann Berkshire, LCSW

## 2020-12-24 NOTE — Progress Notes (Addendum)
[address]       [place] [address]             [phone]      6 Days Post-Op Procedure(s) (LRB): XI ROBOTIC ASSISTED THORASCOPY FOR BIOPSY AP WINDOW LYMPH NODES (Left) LYMPH NODE BIOPSY (Left) INTERCOSTAL NERVE BLOCK (Left) Subjective: D/C held yesterday d/t low BP and dizziness with ambulation- prob dehydrated, holding lasix after discussion with Dr Rebecca Eaton this am   Objective: Vital signs in last 24 hours: Temp:  [97.9 F (36.6 C)-98.3 F (36.8 C)] 98.3 F (36.8 C) (02/16 0349) Pulse Rate:  [80-106] 104 (02/16 0349) Cardiac Rhythm: Sinus tachycardia (02/15 1930) Resp:  [19-35] 20 (02/16 0349) BP: (83-118)/(48-90) 102/83 (02/16 0349) SpO2:  [91 %-98 %] 95 % (02/16 0349) Weight:  [111.7 kg] 111.7 kg (02/16 0400)  Hemodynamic parameters for last 24 hours:    Intake/Output from previous day: 02/15 0701 - 02/16 0700 In: 3532 [P.O.:1156] Out: 1200 [Urine:1200] Intake/Output this shift: No intake/output data recorded.  General appearance: alert, cooperative, fatigued and no distress Heart: regular rate and rhythm and tachy Lungs: fair air exchange but dowsn't take deep breaths Abdomen: benign Extremities: no edema Wound: incis healing well  Lab Results: No results for input(s): WBC, HGB, HCT, PLT in the last 72 hours. BMET: Recent Labs    12/23/20 0442 12/24/20 0039  NA 135 136  K 3.5 4.0  CL 92* 94*  CO2 30 28  GLUCOSE 101* 105*  BUN 21* 26*  CREATININE 1.09 1.23  CALCIUM 9.1 9.3    PT/INR: No results for input(s): LABPROT, INR in the last 72 hours. ABG    Component Value Date/Time   PHART 7.411 12/16/2020 1401   HCO3 24.0 12/16/2020 1401   TCO2 34 (H) 10/14/2020 1019   ACIDBASEDEF 0.0 12/16/2020 1401   O2SAT 96.9 12/16/2020 1401   CBG (last 3)  Recent Labs    12/23/20 1644 12/23/20 2116 12/24/20 0622  GLUCAP 192* 173* 104*    Meds Scheduled Meds: . atorvastatin  10 mg Oral Daily  . bisacodyl  10 mg Oral  Daily  . brimonidine  1 drop Both Eyes BID  . Chlorhexidine Gluconate Cloth  6 each Topical Daily  . dapagliflozin propanediol  10 mg Oral Daily  . divalproex  500 mg Oral BID  . enoxaparin (LOVENOX) injection  40 mg Subcutaneous Q24H  . insulin aspart  0-24 Units Subcutaneous TID AC  . latanoprost  1 drop Both Eyes QHS  . levothyroxine  100 mcg Oral QAC breakfast  . melatonin  3 mg Oral QHS  . metoCLOPramide (REGLAN) injection  10 mg Intravenous Q6H  . metoprolol succinate  150 mg Oral Daily  . pantoprazole  40 mg Oral Daily  . polyethylene glycol  17 g Oral BID  . risperiDONE  0.5 mg Oral QHS  . sacubitril-valsartan  1 tablet Oral BID  . senna  2 tablet Oral BID  . senna-docusate  1 tablet Oral QHS  . sertraline  25 mg Oral Daily  . tamsulosin  0.4 mg Oral Daily   Continuous Infusions: PRN Meds:.ipratropium-albuterol, ondansetron (ZOFRAN) IV, oxyCODONE, traMADol  Xrays No results found.  Results for orders placed or performed during the hospital encounter of 12/18/20  Aerobic/Anaerobic Culture (surgical/deep wound)     Status: None   Collection Time: 12/18/20  1:04 PM   Specimen: PATH Lymph node excision; Tissue  Result Value Ref Range Status   Specimen  Description TISSUE  Final   Special Requests MEDIASTINAL LYMPH NODE SPEC A  Final   Gram Stain NO WBC SEEN NO ORGANISMS SEEN   Final   Culture   Final    RARE PROPIONIBACTERIUM ACNES Standardized susceptibility testing for this organism is not available. CRITICAL RESULT CALLED TO, READ BACK BY AND VERIFIED WITH: C,VECNA RN @1512  12/23/20 EB Performed at [facility], [place] [address] [place], [place] [address]    Report Status 12/23/2020 FINAL  Final  Acid Fast Smear (AFB)     Status: None   Collection Time: 12/18/20  1:04 PM   Specimen: PATH Lymph node excision; Tissue  Result Value Ref Range Status   AFB Specimen Processing Concentration  Final   Acid Fast Smear Negative  Final    Comment: (NOTE) Performed  At: [facility] [place], [place] [id number] Rush Farmer MD DP:[id number]    Source (AFB) TISSUE  Final    Comment: Performed at [facility], [place] [address], [place], [place] [address]  SARS CORONAVIRUS 2 (TAT 6-24 HRS) Nasopharyngeal Nasopharyngeal Swab     Status: None   Collection Time: 12/22/20  2:17 PM   Specimen: Nasopharyngeal Swab  Result Value Ref Range Status   SARS Coronavirus 2 NEGATIVE NEGATIVE Final    Comment: (NOTE) SARS-CoV-2 target nucleic acids are NOT DETECTED.  The SARS-CoV-2 RNA is generally detectable in upper and lower respiratory specimens during the acute phase of infection. Negative results do not preclude SARS-CoV-2 infection, do not rule out co-infections with other pathogens, and should not be used as the sole basis for treatment or other patient management decisions. Negative results must be combined with clinical observations, patient history, and epidemiological information. The expected result is Negative.  Fact Sheet for Patients: SugarRoll.be  Fact Sheet for Healthcare Providers: https://www.woods-mathews.com/  This test is not yet approved or cleared by the [place] FDA and  has been authorized for detection and/or diagnosis of SARS-CoV-2 by FDA under an Emergency Use Authorization (EUA). This EUA will remain  in effect (meaning this test can be used) for the duration of the COVID-19 declaration under Se ction 564(b)(1) of the Act, 21 U.S.C. section 360bbb-3(b)(1), unless the authorization is terminated or revoked sooner.  Performed at [facility], [place] [address] [place], [place] [address]      Assessment/Plan: S/P Procedure(s) (LRB): XI ROBOTIC ASSISTED THORASCOPY FOR BIOPSY AP WINDOW LYMPH NODES (Left) LYMPH NODE BIOPSY (Left) INTERCOSTAL NERVE BLOCK (Left)  1 afeb, VSS except low BP readings with ambulation, poor stamina and dizziness. Sinus  tach, had a couple short episodes of Vtach 2 sats good on RA 3 creat 1.09 yesterday--- today 1.23. K+ ok 4 BS adeq control on SSI/farxiga- will need to be careful with since may be dehydrated, was on levemir at home 5  RARE PROPIONIBACTERIUM ACNES - uncertain if need to treat , will d/w MD(mediastinal LN culture) 6 discharge when cardiology issues resolved/stable     LOS: 6 days    Gaspar Bidding Pager [phone] 12/24/2020   --Addendum 08:45: Dr. Roxan Hockey requested reduction of metoprolol dosing due to hypotension and persistent dizziness yesterday. Toprol XL decreased to 100mg  po daily.  --Addendum 11:27: Mr. Kilgore has walked with the rehab team this morning without dizziness and with stable BP.  Discussed with cardiology, no reason for him to remain an inpatient from their perspective. Will discharge back to the [facility] per Care Manager's documentation.  Macarthur Critchley, PA-C  Patient seen and examined, agree with above Path- likely Hodgkin's lymphoma pending further test results- can follow up with Dr. Lorenso Courier as outpatient  Revonda Standard. Roxan Hockey, MD [facility] [phone]

## 2020-12-26 ENCOUNTER — Telehealth: Payer: Self-pay | Admitting: *Deleted

## 2020-12-26 NOTE — Telephone Encounter (Signed)
Received phone call to confirm upcoming appointment 3/1 with Dr Megan Salon. Infectious Disease has not seen this patient, he is not following up in outpatient clinic. Multiple chart accesses from [facility] staff trying to clarify information. Upon investigation, Charles [name] was looking at another patient's appointment information. Charles Gandy, RN

## 2020-12-29 ENCOUNTER — Encounter (HOSPITAL_COMMUNITY): Payer: Self-pay

## 2020-12-29 ENCOUNTER — Other Ambulatory Visit: Payer: Self-pay

## 2020-12-29 ENCOUNTER — Ambulatory Visit (HOSPITAL_COMMUNITY)
Admit: 2020-12-29 | Discharge: 2020-12-29 | Disposition: A | Discharge status: Home / Self Care | Payer: Medicaid Other | Attending: Adult Health | Admitting: Adult Health

## 2020-12-29 VITALS — BP 108/68 | HR 120 | Wt 255.5 lb

## 2020-12-29 DIAGNOSIS — F17210 Nicotine dependence, cigarettes, uncomplicated: Secondary | ICD-10-CM | POA: Insufficient documentation

## 2020-12-29 DIAGNOSIS — Z8249 Family history of ischemic heart disease and other diseases of the circulatory system: Secondary | ICD-10-CM | POA: Insufficient documentation

## 2020-12-29 DIAGNOSIS — I10 Essential (primary) hypertension: Secondary | ICD-10-CM

## 2020-12-29 DIAGNOSIS — Z794 Long term (current) use of insulin: Secondary | ICD-10-CM | POA: Insufficient documentation

## 2020-12-29 DIAGNOSIS — I110 Hypertensive heart disease with heart failure: Secondary | ICD-10-CM | POA: Diagnosis not present

## 2020-12-29 DIAGNOSIS — I428 Other cardiomyopathies: Secondary | ICD-10-CM | POA: Diagnosis not present

## 2020-12-29 DIAGNOSIS — I252 Old myocardial infarction: Secondary | ICD-10-CM | POA: Diagnosis not present

## 2020-12-29 DIAGNOSIS — R0602 Shortness of breath: Secondary | ICD-10-CM | POA: Diagnosis not present

## 2020-12-29 DIAGNOSIS — F1410 Cocaine abuse, uncomplicated: Secondary | ICD-10-CM | POA: Insufficient documentation

## 2020-12-29 DIAGNOSIS — Z79899 Other long term (current) drug therapy: Secondary | ICD-10-CM | POA: Diagnosis not present

## 2020-12-29 DIAGNOSIS — Z8616 Personal history of COVID-19: Secondary | ICD-10-CM | POA: Insufficient documentation

## 2020-12-29 DIAGNOSIS — E119 Type 2 diabetes mellitus without complications: Secondary | ICD-10-CM | POA: Diagnosis not present

## 2020-12-29 DIAGNOSIS — R590 Localized enlarged lymph nodes: Secondary | ICD-10-CM | POA: Insufficient documentation

## 2020-12-29 DIAGNOSIS — E785 Hyperlipidemia, unspecified: Secondary | ICD-10-CM | POA: Diagnosis not present

## 2020-12-29 DIAGNOSIS — I5022 Chronic systolic (congestive) heart failure: Secondary | ICD-10-CM | POA: Diagnosis present

## 2020-12-29 LAB — BASIC METABOLIC PANEL
Anion gap: 12 (ref 5–15)
BUN: 13 mg/dL (ref 6–20)
CO2: 23 mmol/L (ref 22–32)
Calcium: 9.3 mg/dL (ref 8.9–10.3)
Chloride: 106 mmol/L (ref 98–111)
Creatinine, Ser: 1.02 mg/dL (ref 0.61–1.24)
GFR, Estimated: >60 mL/min (ref >60)
Glucose, Bld: 111 mg/dL — ABNORMAL HIGH (ref 70–99)
Potassium: 5.2 mmol/L — ABNORMAL HIGH (ref 3.5–5.1)
Sodium: 141 mmol/L (ref 135–145)

## 2020-12-29 LAB — BRAIN NATRIURETIC PEPTIDE: B Natriuretic Peptide: 559.7 pg/mL — ABNORMAL HIGH (ref 0.0–100.0)

## 2020-12-29 MED ORDER — FUROSEMIDE 40 MG PO TABS: 40.0000 mg | ORAL_TABLET | Freq: Every day | ORAL | 11 refills | Status: DC

## 2020-12-29 MED ORDER — DIGOXIN 125 MCG PO TABS: 125.0000 ug | ORAL_TABLET | Freq: Every day | ORAL | 11 refills | Status: AC

## 2020-12-29 MED ORDER — SPIRONOLACTONE 25 MG PO TABS: 12.5000 mg | ORAL_TABLET | Freq: Every day | ORAL | 11 refills | Status: DC

## 2020-12-29 NOTE — Patient Instructions (Signed)
Start Digoxin 0.125 mg Daily  Start Spironolactone 12.5 mg (1/2 tab) Daily  Start Furosemide 40 mg Daily  Our Social Worker will contact you regarding a Primary Care MD  Thank you for allowing [place] to provider your heart failure care after your recent hospitalization. Please follow-up with our [facility] in 2 weeks  If you have any questions or concerns before your next appointment please send [place] a message through [facility] or call our office at [phone].    TO LEAVE A MESSAGE FOR THE NURSE SELECT OPTION 2, PLEASE LEAVE A MESSAGE INCLUDING: . YOUR NAME . DATE OF BIRTH . CALL BACK NUMBER . REASON FOR CALL**this is important as we prioritize the call backs  YOU WILL RECEIVE A CALL BACK THE SAME DAY AS LONG AS YOU CALL BEFORE 4:00 PM

## 2020-12-29 NOTE — Progress Notes (Signed)
HEART IMPACT TRANSITION OF CARE CONSULT NOTE     Referring Physician: Dr Roxan Hockey  Primary Care: None  Primary Cardiologist: Dr Harrell Gave at [facility]  And Dr Merrilyn Puma at [facility].  Oncology: Dr Lorenso Courier.   HPI: Charles [name] is a 55 year old referred by Dr for consultation regarding heart failure  Charles [name] is a 55 year old with a history of NICM, chronic systolic heart failure, DMII, HTN, HLD, ETOH, cocaine abuse, paranoid schizophrenia, tobacco abuse, and mediastinal lymphadenopathy.  Quit smoking 12/17/20.    Admitted 10/10/20 with increased shortness of breath. Had A/C systolic heart failure. Diuresed with IV lasix.   Had ED visit 10/28/20 and 12/12/20  for suicidal ideation.   In December he was referred by his oncologist to CT surgery for mediastinoscopy for biopsy of lymph nodes.   Had COVID 11/2020  Admitted 12/18/20 for scheduled Robotic VATS and biopsy. Pathology concerning for lymphoma. Cardiology consulted to assist with HF meds. Diuresed with IV lasix and later stopped due to hypotension. Prior to d/c he was started on farxiga and continued on bb + entresto.Digoxin, lasix, and spiro stopped. He was discharged to [facility].   Overall feeling fine. Uses wheel chair when he leaves the facility. SOB with exertion. + Orthopnea. Denies PND. Says he cant eat much. Says he gets full fast. Appetite ok. No fever or chills. Weight at home 255 pounds. Taking all medications. All meds provided at ALF.     Cardiac Testing  10/11/2021 ECHO EF 20-25% RV not well visualized.   10/14/20 Cath  Normal Cors,  PA 34/22 mean 26, PCWP 26, CO 4.96 ,and CI 2.1   CTA 11/2020  1. No evidence of pulmonary embolus. 2. Basilar predominant interstitial and ground-glass opacities most consistent with COVID-19 pneumonia given clinical history. 3. Mediastinal and hilar lymphadenopathy. This may be a combination of known underlying lymphoma with superimposed reactive change  given pneumonia. 4. Stable left ventricular dilatation  PET Scan 09/2020  1. Prevascular adenopathy with individual nodes measuring up to2.4  cm in diameter, maximum SUV 6.4 ([place] 4 activity). 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 No  adenopathy or suspicious hypermetabolic activity in the neck,  abdomen/pelvis, or skeleton.  2. Hepatic steatosis.  3. Nonobstructive left nephrolithiasis.  4. Prostatomegaly.  5. Aortic Atherosclerosis     Review of Systems: [y] = yes, [ ]  = no   . General: Weight gain [Y ]; Weight loss [ ] ; Anorexia [ ] ; Fatigue [ ] ; Fever [ ] ; Chills [ ] ; Weakness [Y ]  . Cardiac: Chest pain/pressure [ ] ; Resting SOB [ ] ; Exertional SOB [ ] ; Orthopnea [ ] ; Pedal Edema [Y ]; Palpitations [ ] ; Syncope [ ] ; Presyncope [ ] ; Paroxysmal nocturnal dyspnea[ ]   . Pulmonary: Cough [ ] ; Wheezing[ ] ; Hemoptysis[ ] ; Sputum [ ] ; Snoring [ ]   . GI: Vomiting[ ] ; Dysphagia[ ] ; Melena[ ] ; Hematochezia [ ] ; Heartburn[ ] ; Abdominal pain [ ] ; Constipation [ ] ; Diarrhea [ ] ; BRBPR [ ]   . GU: Hematuria[ ] ; Dysuria [ ] ; Nocturia[ ]   . Vascular: Pain in legs with walking [ ] ; Pain in feet with lying flat [ ] ; Non-healing sores [ ] ; Stroke [Y ]; TIA [ ] ; Slurred speech [ ] ;  . Neuro: Headaches[ ] ; Vertigo[ ] ; Seizures[ ] ; Paresthesias[ ] ;Blurred vision [ ] ; Diplopia [ ] ; Vision changes [ ]   . Ortho/Skin: Arthritis [ ] ; Joint pain [ Y]; Muscle pain [ ] ; Joint  swelling [ ] ; Back Pain [Y ]; Rash [ ]   . Psych: Depression[ ] ; Anxiety[ ]   . Heme: Bleeding problems [ ] ; Clotting disorders [ ] ; Anemia [ ]   . Endocrine: Diabetes [Y ]; Thyroid dysfunction[ ]    Past Medical History:  Diagnosis Date  . Arthritis    lower back  . Bipolar 1 disorder ([facility])   . Cardiomyopathy, nonischemic ([place])    no CAD 10/14/20 cath, EF 20-25% by 10/11/20 echo  . CHF (congestive heart failure) ([place])   . COVID 10/2020  . Depression   . DM2 (diabetes mellitus, type 2) ([place])   .  HTN (hypertension)   . Myocardial infarction [facility])     Current Outpatient Medications  Medication Sig Dispense Refill  . acetaminophen (TYLENOL) 325 MG tablet Take 650 mg by mouth every 6 (six) hours as needed for moderate pain.    Marland Kitchen atorvastatin (LIPITOR) 10 MG tablet Take 10 mg by mouth daily.    . blood glucose meter kit and supplies KIT Dispense based on patient and insurance preference. Use up to four times daily as directed. (FOR ICD-9 250.00, 250.01). 1 each 0  . brimonidine (ALPHAGAN) 0.2 % ophthalmic solution Place 1 drop into both eyes 2 (two) times daily.     . cyclobenzaprine (FLEXERIL) 10 MG tablet Take 10 mg by mouth 3 (three) times daily.    . dapagliflozin propanediol (FARXIGA) 10 MG TABS tablet Take 1 tablet (10 mg total) by mouth daily. 30 tablet 3  . divalproex (DEPAKOTE) 500 MG DR tablet Take 1 tablet (500 mg total) by mouth 2 (two) times daily. 60 tablet 0  . insulin detemir (LEVEMIR) 100 UNIT/ML FlexPen Inject 25 Units into the skin daily. 15 mL 11  . Insulin Pen Needle (PEN NEEDLES 29GX1/2") 29G X 12MM MISC 1 each by Does not apply route daily. 100 each 0  . latanoprost (XALATAN) 0.005 % ophthalmic solution Place 1 drop into both eyes at bedtime.     Marland Kitchen levothyroxine (SYNTHROID) 100 MCG tablet Take 100 mcg by mouth daily before breakfast.    . melatonin 3 MG TABS tablet Take 3 mg by mouth at bedtime.    . metoprolol succinate (TOPROL-XL) 100 MG 24 hr tablet Take 1 tablet (100 mg total) by mouth daily. Take with or immediately following a meal. 30 tablet 2  . Paliperidone Palmitate (INVEGA SUSTENNA IM) Inject 1 pen into the muscle every 30 (thirty) days.    . polyethylene glycol (MIRALAX / GLYCOLAX) 17 g packet Take 17 g by mouth 2 (two) times daily. 14 each 0  . potassium chloride SA (KLOR-CON) 20 MEQ tablet Take 2 tablets (40 mEq total) by mouth daily. 30 tablet 1  . risperiDONE (RISPERDAL) 0.5 MG tablet Take 1 tablet (0.5 mg total) by mouth at bedtime. 30 tablet 0  .  sacubitril-valsartan (ENTRESTO) 24-26 MG Take 1 tablet by mouth 2 (two) times daily.    Marland Kitchen senna (SENOKOT) 8.6 MG TABS tablet Take 2 tablets (17.2 mg total) by mouth 2 (two) times daily. 120 tablet 0  . sertraline (ZOLOFT) 25 MG tablet Take 1 tablet (25 mg total) by mouth daily. 30 tablet 0  . tamsulosin (FLOMAX) 0.4 MG CAPS capsule Take 0.4 mg by mouth daily.    . traMADol (ULTRAM) 50 MG tablet Take 1-2 tablets (50-100 mg total) by mouth every 12 (twelve) hours as needed (mild pain). 12 tablet 0   No current facility-administered medications for this encounter.    Allergies  Allergen Reactions  . Ibuprofen   . Penicillins Rash      Social History   Socioeconomic History  . Marital status: Divorced    Spouse name: Not on file  . Number of children: 1  . Years of education: Not on file  . Highest education level: Not on file  Occupational History  . Not on file  Tobacco Use  . Smoking status: Current Some Day Smoker    Packs/day: 0.25    Types: Cigarettes  . Smokeless tobacco: Never Used  . Tobacco comment: occasionally  Vaping Use  . Vaping Use: Never used  Substance and Sexual Activity  . Alcohol use: Never  . Drug use: Not Currently    Types: Cocaine  . Sexual activity: Not Currently  Other Topics Concern  . Not on file  Social History Narrative  . Not on file   Social Determinants of Health   Financial Resource Strain: Medium Risk  . Difficulty of Paying Living Expenses: Somewhat hard  Food Insecurity: No Food Insecurity  . Worried About Charity fundraiser in the Last Year: Never true  . Ran Out of Food in the Last Year: Never true  Transportation Needs: No Transportation Needs  . Lack of Transportation (Medical): No  . Lack of Transportation (Non-Medical): No  Physical Activity: Unknown  . Days of Exercise per Week: 0 days  . Minutes of Exercise per Session: Not on file  Stress: Not on file  Social Connections: Not on file  Intimate Partner Violence: Not  on file      Family History  Problem Relation Age of Onset  . Hypertension Mother   . Hyperlipidemia Mother   . Stroke Mother   . Cancer Mother   . Hypertension Father   . Hyperlipidemia Father   . Healthy Sister     Vitals:   12/29/20 1409  BP: 108/68  Pulse: (!) 120  SpO2: 95%  Weight: 115.9 kg    ReDS Vest / Clip - 12/29/20 1409      ReDS Vest / Clip   Station Marker D    Ruler Value 41    ReDS Actual Value 34            PHYSICAL EXAM: General:  Arrived in a wheel chair. No respiratory difficulty HEENT: normal Neck: supple. JVP 8-9 . Carotids 2+ bilat; no bruits. No lymphadenopathy or thryomegaly appreciated. Cor: PMI nondisplaced. Tachy Regular rate & rhythm. No rubs, gallops or murmurs. Lungs: clear Abdomen: soft, nontender, nondistended. No hepatosplenomegaly. No bruits or masses. Good bowel sounds. Extremities: no cyanosis, clubbing, rash, edema Neuro: alert & oriented x 3, cranial nerves grossly intact. moves all 4 extremities w/o difficulty. Affect pleasant.  XTK:WIOXB Tachycardia  QRS 108 ms   ASSESSMENT & PLAN:  1. Chronic Systolic Heart Failure, NICM  Had LHC 10/2020 normal cors.  - 10/11/2020 Echo EF 20-25%  NYHA III. Reds Cip 34%.  - Volume status mildly elevated. Add lasix 40 mg daily. - Add digoxin 0.125 mg daily. Next visit consider ivabradine if he remains tachycardic.   GDMT  -BB-Continue metoprolol 100 mg daily  -ARNI- Continue entresto 24-26 mg twice a day  -MRA-  Restart 12.5 mg spironolactone daily.  -SGLT2i- Continue farxiga 10 mg daily  - Check BMET/BNP  - Discussed low salt food choices.  Discussed medication changes.  - Plan to repeat ECHO in 3 months after Hf meds optimized.   2. Mediastinal Lymphadenopathy Had biopsy 2/022 He has follow up  with his Oncology later this week.   3. DMII -Needs to establish with PCP  4. HTN  Stable today. See above.   Referred to HFSW (PCP & Financial ): Yes Refer to Pharmacy: Yes   Refer to Home Health:  No Refer to Advanced Heart Failure Clinic: Yes  Refer to General Cardiology: Already followed.   Follow up in 2 weeks in the APP HF clinic. He is at high risk for readmit.   Mikey Maffett NP-C  4:55 PM

## 2020-12-30 ENCOUNTER — Encounter (INDEPENDENT_AMBULATORY_CARE_PROVIDER_SITE_OTHER): Payer: Self-pay

## 2020-12-30 DIAGNOSIS — Z4802 Encounter for removal of sutures: Secondary | ICD-10-CM

## 2020-12-31 ENCOUNTER — Ambulatory Visit: Payer: Medicaid Other | Admitting: Physician Assistant

## 2021-01-01 ENCOUNTER — Other Ambulatory Visit: Payer: Self-pay | Admitting: Hematology and Oncology

## 2021-01-01 ENCOUNTER — Other Ambulatory Visit (HOSPITAL_COMMUNITY): Payer: Self-pay | Admitting: Cardiology

## 2021-01-01 ENCOUNTER — Inpatient Hospital Stay: Payer: Medicaid Other | Attending: Hematology and Oncology | Admitting: Hematology and Oncology

## 2021-01-01 ENCOUNTER — Inpatient Hospital Stay: Payer: Medicaid Other

## 2021-01-01 ENCOUNTER — Other Ambulatory Visit: Payer: Self-pay

## 2021-01-01 ENCOUNTER — Encounter: Payer: Self-pay | Admitting: Hematology and Oncology

## 2021-01-01 VITALS — BP 100/61 | HR 135 | Temp 97.7°F | Resp 17 | Ht 69.0 in | Wt 256.3 lb

## 2021-01-01 DIAGNOSIS — C8102 Nodular lymphocyte predominant Hodgkin lymphoma, intrathoracic lymph nodes: Secondary | ICD-10-CM | POA: Insufficient documentation

## 2021-01-01 DIAGNOSIS — Z809 Family history of malignant neoplasm, unspecified: Secondary | ICD-10-CM | POA: Diagnosis not present

## 2021-01-01 DIAGNOSIS — F17210 Nicotine dependence, cigarettes, uncomplicated: Secondary | ICD-10-CM | POA: Diagnosis not present

## 2021-01-01 DIAGNOSIS — R590 Localized enlarged lymph nodes: Secondary | ICD-10-CM

## 2021-01-01 DIAGNOSIS — Z8616 Personal history of COVID-19: Secondary | ICD-10-CM | POA: Diagnosis not present

## 2021-01-01 DIAGNOSIS — I5022 Chronic systolic (congestive) heart failure: Secondary | ICD-10-CM

## 2021-01-01 LAB — CMP (CANCER CENTER ONLY)
ALT: 23 U/L (ref 0–44)
AST: 12 U/L — ABNORMAL LOW (ref 15–41)
Albumin: 3.4 g/dL — ABNORMAL LOW (ref 3.5–5.0)
Alkaline Phosphatase: 132 U/L — ABNORMAL HIGH (ref 38–126)
Anion gap: 9 (ref 5–15)
BUN: 12 mg/dL (ref 6–20)
CO2: 24 mmol/L (ref 22–32)
Calcium: 9.3 mg/dL (ref 8.9–10.3)
Chloride: 107 mmol/L (ref 98–111)
Creatinine: 1.22 mg/dL (ref 0.61–1.24)
GFR, Estimated: >60 mL/min (ref >60)
Glucose, Bld: 106 mg/dL — ABNORMAL HIGH (ref 70–99)
Potassium: 4.5 mmol/L (ref 3.5–5.1)
Sodium: 140 mmol/L (ref 135–145)
Total Bilirubin: 0.3 mg/dL (ref 0.3–1.2)
Total Protein: 7.3 g/dL (ref 6.5–8.1)

## 2021-01-01 LAB — CBC WITH DIFFERENTIAL (CANCER CENTER ONLY)
Abs Immature Granulocytes: 0.04 10*3/uL (ref 0.00–0.07)
Basophils Absolute: 0.1 10*3/uL (ref 0.0–0.1)
Basophils Relative: 1 %
Eosinophils Absolute: 0.2 10*3/uL (ref 0.0–0.5)
Eosinophils Relative: 2 %
HCT: 45.2 % (ref 39.0–52.0)
Hemoglobin: 14.6 g/dL (ref 13.0–17.0)
Immature Granulocytes: 0 %
Lymphocytes Relative: 31 %
Lymphs Abs: 3.3 10*3/uL (ref 0.7–4.0)
MCH: 24.9 pg — ABNORMAL LOW (ref 26.0–34.0)
MCHC: 32.3 g/dL (ref 30.0–36.0)
MCV: 77.1 fL — ABNORMAL LOW (ref 80.0–100.0)
Monocytes Absolute: 0.6 10*3/uL (ref 0.1–1.0)
Monocytes Relative: 6 %
Neutro Abs: 6.5 10*3/uL (ref 1.7–7.7)
Neutrophils Relative %: 60 %
Platelet Count: 247 10*3/uL (ref 150–400)
RBC: 5.86 MIL/uL — ABNORMAL HIGH (ref 4.22–5.81)
RDW: 17.3 % — ABNORMAL HIGH (ref 11.5–15.5)
WBC Count: 10.8 10*3/uL — ABNORMAL HIGH (ref 4.0–10.5)
nRBC: 0 % (ref 0.0–0.2)

## 2021-01-01 LAB — SURGICAL PATHOLOGY

## 2021-01-01 NOTE — Progress Notes (Signed)
bmert

## 2021-01-01 NOTE — Progress Notes (Signed)
[place] [phone]   [phone]  PROGRESS NOTE  Patient Care Team: Patient, No Pcp Per as PCP - General (General Practice) [name], MD as PCP - Cardiology (Cardiology)  Hematological/Oncological History # Nodular Lymphocyte Predominate Hodgkin Lymphoma, Stage 1A 1) 07/18/2020: CT PE study showed no PE, however there was noted to be mediastinal lymphadenopathy.  2) 09/23/2020: PET CT scan shows prevascular adenopathy with individual nodes measuring up to 2.4 cm in diameter, maximum SUV 6.4 ([place] 4 activity). 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 Lorenso Courier  4) 12/18/2020: robotic thorascopy for mediastinal lymph node biopsy. Results confirm nodular lymphocyte predominate Hodgkin lymphoma  #Elevated PTT 1) 02/2020: patient developed an extensive hematoma following a decompression procedure with neurosurgery. Reported to have an elevated PTT at that time.  2) 02/26/2020: Pt 11.6, INR 0.98, aPTT 41.8  3) 02/27/2020: aPTT 34.4, Factor VIII 218, vWF activty 130%, Factor IX 211%, Factor XI 165% 4) 09/25/2020:establish care with Dr. Lorenso Courier   Interval History:  Charles [name] 55 y.o. male with medical history significant for nodular lymphocyte predominate Hodgkin lymphoma who presents for a follow up visit. The patient's last visit was on 09/25/2021 at which time he established care. In the interim since the last visit he underwent a mediastinoscopy biopsy which confirmed the diagnosis.   On exam today Charles [name] ports he has been well overall interim since his last visit.  He underwent his biopsy of the lymph nodes and was told that he had "cancer".  He reports that he does have some episodes of rapid shallow breathing that has persisted for the last several days.  It does not interfere with his ability to speak and only occurs sporadically.  He is also lost a considerable amount of  weight and is down 25 pounds from 281 at his last visit to 256 today.  He notes that this may be due to "loss of water weight".  He notes he does have some soreness at the surgical site but otherwise did not have any frank chest pain.  He also denies having any shortness of breath.  He reports no fevers, chills, sweats, nausea, vomiting or diarrhea.  A full 10 point ROS is listed below.  The bulk of our discussion today focused on the diagnosis of nodular lymphocyte predominant Hodgkin lymphoma and the treatments thereof.  This discussion is detailed below.  MEDICAL HISTORY:  Past Medical History:  Diagnosis Date  . Arthritis    lower back  . Bipolar 1 disorder ([place])   . Cardiomyopathy, nonischemic ([place])    no CAD 10/14/20 cath, EF 20-25% by 10/11/20 echo  . CHF (congestive heart failure) ([place])   . COVID 10/2020  . Depression   . DM2 (diabetes mellitus, type 2) ([place])   . HTN (hypertension)   . Myocardial infarction [facility])     SURGICAL HISTORY: Past Surgical History:  Procedure Laterality Date  . BACK SURGERY  01/2020  . CARDIAC CATHETERIZATION  10/2020  . CHOLECYSTECTOMY    . HERNIA REPAIR    . INTERCOSTAL NERVE BLOCK Left 12/18/2020   Procedure: INTERCOSTAL NERVE BLOCK;  Surgeon: Melrose Nakayama, MD;  Location: [place];  Service: Thoracic;  Laterality: Left;  . LYMPH NODE BIOPSY Left 12/18/2020   Procedure: LYMPH NODE BIOPSY;  Surgeon: Melrose Nakayama, MD;  Location: [place];  Service: Thoracic;  Laterality: Left;  . RIGHT/LEFT HEART CATH AND CORONARY ANGIOGRAPHY N/A 10/14/2020  Procedure: RIGHT/LEFT HEART CATH AND CORONARY ANGIOGRAPHY;  Surgeon: Corky Crafts, MD;  Location: [facility] INVASIVE CV LAB;  Service: Cardiovascular;  Laterality: N/A;  . SPINAL FIXATION SURGERY  02/26/2020  . XI ROBOTIC ASSISTED THORASCOPY FOR BIOPSY AP WINDOW LYMPH NODES (Left)  12/18/2020    SOCIAL HISTORY: Social History   Socioeconomic History  . Marital status: Divorced    Spouse name:  Not on file  . Number of children: 1  . Years of education: Not on file  . Highest education level: Not on file  Occupational History  . Not on file  Tobacco Use  . Smoking status: Current Some Day Smoker    Packs/day: 0.25    Types: Cigarettes  . Smokeless tobacco: Never Used  . Tobacco comment: occasionally  Vaping Use  . Vaping Use: Never used  Substance and Sexual Activity  . Alcohol use: Never  . Drug use: Not Currently    Types: Cocaine  . Sexual activity: Not Currently  Other Topics Concern  . Not on file  Social History Narrative  . Not on file   Social Determinants of Health   Financial Resource Strain: Medium Risk  . Difficulty of Paying Living Expenses: Somewhat hard  Food Insecurity: No Food Insecurity  . Worried About Programme researcher, broadcasting/film/video in the Last Year: Never true  . Ran Out of Food in the Last Year: Never true  Transportation Needs: No Transportation Needs  . Lack of Transportation (Medical): No  . Lack of Transportation (Non-Medical): No  Physical Activity: Unknown  . Days of Exercise per Week: 0 days  . Minutes of Exercise per Session: Not on file  Stress: Not on file  Social Connections: Not on file  Intimate Partner Violence: Not on file    FAMILY HISTORY: Family History  Problem Relation Age of Onset  . Hypertension Mother   . Hyperlipidemia Mother   . Stroke Mother   . Cancer Mother   . Hypertension Father   . Hyperlipidemia Father   . Healthy Sister     ALLERGIES:  is allergic to ibuprofen and penicillins.  MEDICATIONS:  Current Outpatient Medications  Medication Sig Dispense Refill  . acetaminophen (TYLENOL) 325 MG tablet Take 650 mg by mouth every 6 (six) hours as needed for moderate pain.    Marland Kitchen atorvastatin (LIPITOR) 10 MG tablet Take 10 mg by mouth daily.    . blood glucose meter kit and supplies KIT Dispense based on patient and insurance preference. Use up to four times daily as directed. (FOR ICD-9 250.00, 250.01). 1 each 0  .  brimonidine (ALPHAGAN) 0.2 % ophthalmic solution Place 1 drop into both eyes 2 (two) times daily.     . cyclobenzaprine (FLEXERIL) 10 MG tablet Take 10 mg by mouth 3 (three) times daily.    . dapagliflozin propanediol (FARXIGA) 10 MG TABS tablet Take 1 tablet (10 mg total) by mouth daily. 30 tablet 3  . digoxin (LANOXIN) 0.125 MG tablet Take 1 tablet (125 mcg total) by mouth daily. 30 tablet 11  . divalproex (DEPAKOTE) 500 MG DR tablet Take 1 tablet (500 mg total) by mouth 2 (two) times daily. 60 tablet 0  . furosemide (LASIX) 40 MG tablet Take 1 tablet (40 mg total) by mouth daily. 30 tablet 11  . insulin detemir (LEVEMIR) 100 UNIT/ML FlexPen Inject 25 Units into the skin daily. 15 mL 11  . Insulin Pen Needle (PEN NEEDLES 29GX1/2") 29G X MISC 1 each by Does not apply  route daily. 100 each 0  . latanoprost (XALATAN) 0.005 % ophthalmic solution Place 1 drop into both eyes at bedtime.     Marland Kitchen levothyroxine (SYNTHROID) 100 MCG tablet Take 100 mcg by mouth daily before breakfast.    . melatonin 3 MG TABS tablet Take 3 mg by mouth at bedtime.    . metoprolol succinate (TOPROL-XL) 100 MG 24 hr tablet Take 1 tablet (100 mg total) by mouth daily. Take with or immediately following a meal. 30 tablet 2  . Paliperidone Palmitate (INVEGA SUSTENNA IM) Inject 1 pen into the muscle every 30 (thirty) days.    . polyethylene glycol (MIRALAX / GLYCOLAX) 17 g packet Take 17 g by mouth 2 (two) times daily. 14 each 0  . potassium chloride SA (KLOR-CON) 20 MEQ tablet Take 2 tablets (40 mEq total) by mouth daily. 30 tablet 1  . risperiDONE (RISPERDAL) 0.5 MG tablet Take 1 tablet (0.5 mg total) by mouth at bedtime. 30 tablet 0  . sacubitril-valsartan (ENTRESTO) 24-26 MG Take 1 tablet by mouth 2 (two) times daily.    Marland Kitchen senna (SENOKOT) 8.6 MG TABS tablet Take 2 tablets (17.2 mg total) by mouth 2 (two) times daily. 120 tablet 0  . sertraline (ZOLOFT) 25 MG tablet Take 1 tablet (25 mg total) by mouth daily. 30 tablet 0  .  spironolactone (ALDACTONE) 25 MG tablet Take 0.5 tablets (12.5 mg total) by mouth daily. 15 tablet 11  . tamsulosin (FLOMAX) 0.4 MG CAPS capsule Take 0.4 mg by mouth daily.    . traMADol (ULTRAM) 50 MG tablet Take 1-2 tablets (50-100 mg total) by mouth every 12 (twelve) hours as needed (mild pain). 12 tablet 0   No current facility-administered medications for this visit.    REVIEW OF SYSTEMS:   Constitutional: ( - ) fevers, ( - )  chills , ( - ) night sweats Eyes: ( - ) blurriness of vision, ( - ) double vision, ( - ) watery eyes Ears, nose, mouth, throat, and face: ( - ) mucositis, ( - ) sore throat Respiratory: ( - ) cough, ( - ) dyspnea, ( - ) wheezes Cardiovascular: ( - ) palpitation, ( - ) chest discomfort, ( - ) lower extremity swelling Gastrointestinal:  ( - ) nausea, ( - ) heartburn, ( - ) change in bowel habits Skin: ( - ) abnormal skin rashes Lymphatics: ( - ) new lymphadenopathy, ( - ) easy bruising Neurological: ( - ) numbness, ( - ) tingling, ( - ) new weaknesses Behavioral/Psych: ( - ) mood change, ( - ) new changes  All other systems were reviewed with the patient and are negative.  PHYSICAL EXAMINATION: ECOG PERFORMANCE STATUS: 2 - Symptomatic, <50% confined to bed  Vitals:   01/01/21 1329  BP: 100/61  Pulse: (!) 135  Resp: 17  Temp: 97.7 F (36.5 C)  SpO2: 100%   Filed Weights   01/01/21 1329  Weight: 256 lb 4.8 oz (116.3 kg)    GENERAL: chronically ill appearing middle aged male in NAD. alert, no distress and comfortable SKIN: skin color, texture, turgor are normal, no rashes or significant lesions EYES: conjunctiva are pink and non-injected, sclera clear LUNGS: clear to auscultation and percussion with normal breathing effort HEART: regular rate & rhythm and no murmurs and no lower extremity edema Musculoskeletal: no cyanosis of digits and no clubbing  PSYCH: alert & oriented x 3, fluent speech NEURO: no focal motor/sensory deficits  LABORATORY DATA:   I have reviewed the  data as listed CBC Latest Ref Rng & Units 12/20/2020 12/19/2020 12/16/2020  WBC 4.0 - 10.5 K/uL 14.9(H) 10.7(H) 10.4  Hemoglobin 13.0 - 17.0 g/dL 13.9 14.7 16.2  Hematocrit 39.0 - 52.0 % 43.6 46.7 51.0  Platelets 150 - 400 K/uL 167 164 211    CMP Latest Ref Rng & Units 12/29/2020 12/24/2020 12/23/2020  Glucose 70 - 99 mg/dL 111(H) 105(H) 101(H)  BUN 6 - 20 mg/dL 13 26(H) 21(H)  Creatinine 0.61 - 1.24 mg/dL 1.02 1.23 1.09  Sodium 135 - 145 mmol/L 141 136 135  Potassium 3.5 - 5.1 mmol/L 5.2(H) 4.0 3.5  Chloride 98 - 111 mmol/L 106 94(L) 92(L)  CO2 22 - 32 mmol/L _0 Calcium 8.9 - 10.3 mg/dL 9.3 9.3 9.1  Total Protein 6.5 - 8.1 g/dL - - -  Total Bilirubin 0.3 - 1.2 mg/dL - - -  Alkaline Phos 38 - 126 U/L - - -  AST 15 - 41 U/L - - -  ALT 0 - 44 U/L - - -    RADIOGRAPHIC STUDIES: DG Chest 1 View  Result Date: 12/13/2020 CLINICAL DATA:  Chronic cough sharp RIGHT-sided chest pain in a 55 year old male EXAM: CHEST  1 VIEW COMPARISON:  11/29/2020 FINDINGS: Trachea midline. Mild cardiac enlargement is similar to previous imaging. Patchy bilateral opacities that were seen on the prior study not as well displayed on the current exam. Lung volumes slightly diminished. No lobar consolidative process. Study limited by technique and body habitus. On limited assessment no acute skeletal process. IMPRESSION: On balance stable exam accounting for technical factors in this patient with reported history of COVID-19 infection on prior exams. No lobar consolidative changes or sign of pleural effusion. Electronically Signed   By: Zetta Bills M.D.   On: 12/13/2020 09:35   DG Chest 2 View  Result Date: 12/21/2020 CLINICAL DATA:  55 year old male postoperative day 3 status post left video-assisted thoracoscopy for biopsy of mediastinal lymph nodes. Pulmonary edema. EXAM: CHEST - 2 VIEW COMPARISON:  Portable chest 12/20/2020 and earlier. FINDINGS: AP and lateral views of the chest today.  Stable right IJ central line. Continued low lung volumes. Stable cardiomegaly and mediastinal contours. No pneumothorax. No pleural effusion is evident. Since yesterday perihilar indistinct opacity has regressed and pulmonary vasculature appears improved. Bibasilar streaky opacity favored to be atelectasis. No acute osseous abnormality identified. Negative visible bowel gas pattern. IMPRESSION: 1. Stable right IJ central line. 2. Regressed edema and perihilar opacity since yesterday. On going low lung volumes with atelectasis. Electronically Signed   By: Genevie Ann M.D.   On: 12/21/2020 07:23   DG Chest 2 View  Result Date: 12/17/2020 CLINICAL DATA:  Lymphoma. Ischemic cardiomyopathy. Pre-op respiratory exam EXAM: CHEST - 2 VIEW COMPARISON:  01/02/2021 FINDINGS: Cardiomegaly and pulmonary vascular congestion are again seen. No evidence of pulmonary edema or focal infiltrate. No evidence of pleural effusion. IMPRESSION: Cardiomegaly and pulmonary vascular congestion. No active lung disease. Electronically Signed   By: Marlaine Hind M.D.   On: 12/17/2020 07:49   DG Chest Port 1 View  Result Date: 12/20/2020 CLINICAL DATA:  Status post chest tube removal EXAM: PORTABLE CHEST 1 VIEW COMPARISON:  December 19, 2020 FINDINGS: Left chest tube is been removed. Central catheter tip is in the superior vena cava. No pneumothorax evident. There is cardiomegaly with pulmonary venous hypertension. There is ill-defined opacity in the right perihilar region as well as areas of interstitial pulmonary edema. No appreciable new opacity. No bone lesions.  No adenopathy. IMPRESSION: No evident pneumothorax. Cardiomegaly with pulmonary vascular congestion. There is a degree of interstitial edema. Ill-defined opacity right perihilar region likely represents alveolar edema. The overall appearance suggests a degree of underlying congestive heart failure. Electronically Signed   By: Lowella Grip III M.D.   On: 12/20/2020 08:40   DG  Chest Port 1 View  Result Date: 12/19/2020 CLINICAL DATA:  Status post chest tube placement EXAM: PORTABLE CHEST 1 VIEW COMPARISON:  12/18/2020 FINDINGS: Cardiac shadow is mildly enlarged but stable. Right jugular central line is again seen. Left-sided chest tube is again noted and stable. No pneumothorax is seen. Stable changes of vascular congestion and edema. No new focal abnormality is noted. IMPRESSION: Stable appearance of the chest when compared with the prior exam. No pneumothorax is noted. Electronically Signed   By: Inez Catalina M.D.   On: 12/19/2020 08:06   DG Chest Port 1 View  Result Date: 12/18/2020 CLINICAL DATA:  Status post thoracoscopy EXAM: PORTABLE CHEST 1 VIEW COMPARISON:  12/16/2020 FINDINGS: Cardiac shadow is enlarged but stable. Right jugular central line is in the superior vena cava. Left-sided chest tube is noted in place. No pneumothorax is seen. Generalized increased density is noted throughout both lungs likely related to mild edema and vascular congestion. No other focal abnormality is noted. IMPRESSION: No evidence of pneumothorax. Tubes and lines as described. Changes consistent with mild pulmonary edema. Electronically Signed   By: Inez Catalina M.D.   On: 12/18/2020 14:58    ASSESSMENT & PLAN Charles [name] 55 y.o. male with medical history significant for nodular lymphocyte predominate Hodgkin lymphoma who presents for a follow up visit.  The patient's findings are most consistent with an early stage nodular lymphocyte predominant Hodgkin's lymphoma.  Given his lack of constitutional symptoms he would be considered a stage Ia.  Treatment of choice for this lymphoma would be radiation therapy.  Observation could be considered, however given the mediastinal location of this tumor I would recommend consideration of treatment.  In the event the patient was found not to be a candidate for radiation oncology we could consider medical management, potentially monotherapy rituximab.   Given that the patient's most likely treatment course will be radiation therapy there is no need for port placement or echocardiogram at this time.  We will plan to reschedule this patient in clinic following his evaluation by radiation oncology.  # Nodular Lymphocyte Predominate Hodgkin Lymphoma, Stage IA (nonbulky) --Findings at this time are most consistent with a stage Ia nodular lymphocyte predominant Hodgkin's lymphoma. --Last PET CT scan performed on this patient was in November 2021.  Discussed with radiation oncology to see if they would prefer a repeat PET CT scan prior to initiation of therapy. --Recommend referral to radiation oncology for definitive treatment of this lymphoma. --In the event that the patient is found not to be a candidate for radiation therapy we would recommend consideration of chemotherapy/immunotherapy. --Return to clinic pending discussion with radiation oncology.   No orders of the defined types were placed in this encounter.   All questions were answered. The patient knows to call the clinic with any problems, questions or concerns.  A total of more than 30 minutes were spent on this encounter and over half of that time was spent on counseling and coordination of care as outlined above.   Ledell Peoples, MD Department of Hematology/Oncology [facility] Phone: [phone] Pager: [phone] Email: [email or web address]  01/01/2021 1:37 PM

## 2021-01-04 ENCOUNTER — Emergency Department (HOSPITAL_COMMUNITY): Payer: Medicaid Other

## 2021-01-04 ENCOUNTER — Other Ambulatory Visit: Payer: Self-pay

## 2021-01-04 ENCOUNTER — Inpatient Hospital Stay (HOSPITAL_COMMUNITY)
Admission: EM | Admit: 2021-01-04 | Discharge: 2021-01-06 | DRG: 291 | Disposition: A | Discharge status: Nursing Home (Includes ICF) | Payer: Medicaid Other | Source: Skilled Nursing Facility | Attending: Student in an Organized Health Care Education/Training Program | Admitting: Student in an Organized Health Care Education/Training Program

## 2021-01-04 ENCOUNTER — Inpatient Hospital Stay (HOSPITAL_COMMUNITY): Payer: Medicaid Other

## 2021-01-04 DIAGNOSIS — Z794 Long term (current) use of insulin: Secondary | ICD-10-CM | POA: Diagnosis not present

## 2021-01-04 DIAGNOSIS — F209 Schizophrenia, unspecified: Secondary | ICD-10-CM | POA: Diagnosis present

## 2021-01-04 DIAGNOSIS — R0902 Hypoxemia: Secondary | ICD-10-CM

## 2021-01-04 DIAGNOSIS — C8590 Non-Hodgkin lymphoma, unspecified, unspecified site: Secondary | ICD-10-CM | POA: Diagnosis not present

## 2021-01-04 DIAGNOSIS — C8190 Hodgkin lymphoma, unspecified, unspecified site: Secondary | ICD-10-CM | POA: Diagnosis present

## 2021-01-04 DIAGNOSIS — I5023 Acute on chronic systolic (congestive) heart failure: Secondary | ICD-10-CM | POA: Diagnosis present

## 2021-01-04 DIAGNOSIS — E039 Hypothyroidism, unspecified: Secondary | ICD-10-CM | POA: Diagnosis present

## 2021-01-04 DIAGNOSIS — N401 Enlarged prostate with lower urinary tract symptoms: Secondary | ICD-10-CM | POA: Diagnosis not present

## 2021-01-04 DIAGNOSIS — Z886 Allergy status to analgesic agent status: Secondary | ICD-10-CM

## 2021-01-04 DIAGNOSIS — R0602 Shortness of breath: Secondary | ICD-10-CM | POA: Diagnosis present

## 2021-01-04 DIAGNOSIS — I252 Old myocardial infarction: Secondary | ICD-10-CM

## 2021-01-04 DIAGNOSIS — F17210 Nicotine dependence, cigarettes, uncomplicated: Secondary | ICD-10-CM | POA: Diagnosis present

## 2021-01-04 DIAGNOSIS — I5043 Acute on chronic combined systolic (congestive) and diastolic (congestive) heart failure: Secondary | ICD-10-CM | POA: Diagnosis not present

## 2021-01-04 DIAGNOSIS — Z79899 Other long term (current) drug therapy: Secondary | ICD-10-CM | POA: Diagnosis not present

## 2021-01-04 DIAGNOSIS — I428 Other cardiomyopathies: Secondary | ICD-10-CM | POA: Diagnosis present

## 2021-01-04 DIAGNOSIS — I10 Essential (primary) hypertension: Secondary | ICD-10-CM | POA: Diagnosis not present

## 2021-01-04 DIAGNOSIS — Z6837 Body mass index (BMI) 37.0-37.9, adult: Secondary | ICD-10-CM

## 2021-01-04 DIAGNOSIS — I509 Heart failure, unspecified: Secondary | ICD-10-CM

## 2021-01-04 DIAGNOSIS — Z880 Allergy status to penicillin: Secondary | ICD-10-CM | POA: Diagnosis not present

## 2021-01-04 DIAGNOSIS — I110 Hypertensive heart disease with heart failure: Principal | ICD-10-CM | POA: Diagnosis present

## 2021-01-04 DIAGNOSIS — R197 Diarrhea, unspecified: Secondary | ICD-10-CM | POA: Diagnosis present

## 2021-01-04 DIAGNOSIS — R140 Abdominal distension (gaseous): Secondary | ICD-10-CM | POA: Diagnosis present

## 2021-01-04 DIAGNOSIS — Z20822 Contact with and (suspected) exposure to covid-19: Secondary | ICD-10-CM | POA: Diagnosis present

## 2021-01-04 DIAGNOSIS — J9601 Acute respiratory failure with hypoxia: Secondary | ICD-10-CM | POA: Diagnosis present

## 2021-01-04 DIAGNOSIS — Z9049 Acquired absence of other specified parts of digestive tract: Secondary | ICD-10-CM | POA: Diagnosis not present

## 2021-01-04 DIAGNOSIS — F200 Paranoid schizophrenia: Secondary | ICD-10-CM | POA: Diagnosis present

## 2021-01-04 DIAGNOSIS — Z8249 Family history of ischemic heart disease and other diseases of the circulatory system: Secondary | ICD-10-CM

## 2021-01-04 DIAGNOSIS — R0600 Dyspnea, unspecified: Secondary | ICD-10-CM | POA: Diagnosis not present

## 2021-01-04 DIAGNOSIS — Z8616 Personal history of COVID-19: Secondary | ICD-10-CM | POA: Diagnosis not present

## 2021-01-04 DIAGNOSIS — E785 Hyperlipidemia, unspecified: Secondary | ICD-10-CM | POA: Diagnosis present

## 2021-01-04 DIAGNOSIS — E119 Type 2 diabetes mellitus without complications: Secondary | ICD-10-CM

## 2021-01-04 DIAGNOSIS — N400 Enlarged prostate without lower urinary tract symptoms: Secondary | ICD-10-CM | POA: Diagnosis present

## 2021-01-04 DIAGNOSIS — Z83438 Family history of other disorder of lipoprotein metabolism and other lipidemia: Secondary | ICD-10-CM | POA: Diagnosis not present

## 2021-01-04 DIAGNOSIS — Z823 Family history of stroke: Secondary | ICD-10-CM | POA: Diagnosis not present

## 2021-01-04 DIAGNOSIS — C8102 Nodular lymphocyte predominant Hodgkin lymphoma, intrathoracic lymph nodes: Secondary | ICD-10-CM | POA: Diagnosis present

## 2021-01-04 LAB — BLOOD GAS, VENOUS
Acid-Base Excess: 3.5 mmol/L — ABNORMAL HIGH (ref 0.0–2.0)
Acid-base deficit: 1.4 mmol/L (ref 0.0–2.0)
Bicarbonate: 23.9 mmol/L (ref 20.0–28.0)
Bicarbonate: 29.1 mmol/L — ABNORMAL HIGH (ref 20.0–28.0)
Drawn by: 47107
FIO2: 21
FIO2: 60
O2 Saturation: 74.2 %
O2 Saturation: 36.4 %
Patient temperature: 37
Patient temperature: 37
pCO2, Ven: 47.7 mmHg (ref 44.0–60.0)
pCO2, Ven: 57.8 mmHg (ref 44.0–60.0)
pH, Ven: 7.32 (ref 7.250–7.430)
pH, Ven: 7.322 (ref 7.250–7.430)
pO2, Ven: <31 mmHg — CL (ref 32.0–45.0)

## 2021-01-04 LAB — BLOOD GAS, ARTERIAL
Acid-Base Excess: 2 mmol/L (ref 0.0–2.0)
Bicarbonate: 25.7 mmol/L (ref 20.0–28.0)
FIO2: 21
O2 Saturation: 97.3 %
Patient temperature: 36.7
pCO2 arterial: 36.8 mmHg (ref 32.0–48.0)
pH, Arterial: 7.457 — ABNORMAL HIGH (ref 7.350–7.450)
pO2, Arterial: 94.6 mmHg (ref 83.0–108.0)

## 2021-01-04 LAB — PROTIME-INR
INR: 1.1 (ref 0.8–1.2)
Prothrombin Time: 13.7 seconds (ref 11.4–15.2)

## 2021-01-04 LAB — RESP PANEL BY RT-PCR (FLU A&B, COVID) ARPGX2
Influenza A by PCR: NEGATIVE
Influenza B by PCR: NEGATIVE
SARS Coronavirus 2 by RT PCR: NEGATIVE

## 2021-01-04 LAB — TROPONIN I (HIGH SENSITIVITY)
Troponin I (High Sensitivity): 48 ng/L — ABNORMAL HIGH (ref <18)
Troponin I (High Sensitivity): 52 ng/L — ABNORMAL HIGH (ref <18)

## 2021-01-04 LAB — CBC WITH DIFFERENTIAL/PLATELET
Abs Immature Granulocytes: 0.06 10*3/uL (ref 0.00–0.07)
Basophils Absolute: 0.1 10*3/uL (ref 0.0–0.1)
Basophils Relative: 1 %
Eosinophils Absolute: 0.3 10*3/uL (ref 0.0–0.5)
Eosinophils Relative: 2 %
HCT: 46.3 % (ref 39.0–52.0)
Hemoglobin: 14.6 g/dL (ref 13.0–17.0)
Immature Granulocytes: 1 %
Lymphocytes Relative: 36 %
Lymphs Abs: 4.4 10*3/uL — ABNORMAL HIGH (ref 0.7–4.0)
MCH: 25 pg — ABNORMAL LOW (ref 26.0–34.0)
MCHC: 31.5 g/dL (ref 30.0–36.0)
MCV: 79.1 fL — ABNORMAL LOW (ref 80.0–100.0)
Monocytes Absolute: 0.6 10*3/uL (ref 0.1–1.0)
Monocytes Relative: 5 %
Neutro Abs: 6.7 10*3/uL (ref 1.7–7.7)
Neutrophils Relative %: 55 %
Platelets: 292 10*3/uL (ref 150–400)
RBC: 5.85 MIL/uL — ABNORMAL HIGH (ref 4.22–5.81)
RDW: 17.9 % — ABNORMAL HIGH (ref 11.5–15.5)
WBC: 12.1 10*3/uL — ABNORMAL HIGH (ref 4.0–10.5)
nRBC: 0 % (ref 0.0–0.2)

## 2021-01-04 LAB — ECHOCARDIOGRAM COMPLETE
Height: 69 in
MV M vel: 3.53 m/s
MV Peak grad: 49.8 mmHg
S' Lateral: 6.7 cm
Weight: 4102.32 oz

## 2021-01-04 LAB — I-STAT VENOUS BLOOD GAS, ED
Acid-base deficit: 2 mmol/L (ref 0.0–2.0)
Bicarbonate: 22.2 mmol/L (ref 20.0–28.0)
Calcium, Ion: 1.14 mmol/L — ABNORMAL LOW (ref 1.15–1.40)
HCT: 47 % (ref 39.0–52.0)
Hemoglobin: 16 g/dL (ref 13.0–17.0)
O2 Saturation: 99 %
Potassium: 4.5 mmol/L (ref 3.5–5.1)
Sodium: 142 mmol/L (ref 135–145)
TCO2: 23 mmol/L (ref 22–32)
pCO2, Ven: 36.5 mmHg — ABNORMAL LOW (ref 44.0–60.0)
pH, Ven: 7.392 (ref 7.250–7.430)
pO2, Ven: 123 mmHg — ABNORMAL HIGH (ref 32.0–45.0)

## 2021-01-04 LAB — COMPREHENSIVE METABOLIC PANEL
ALT: 28 U/L (ref 0–44)
AST: 25 U/L (ref 15–41)
Albumin: 3.4 g/dL — ABNORMAL LOW (ref 3.5–5.0)
Alkaline Phosphatase: 101 U/L (ref 38–126)
Anion gap: 11 (ref 5–15)
BUN: 12 mg/dL (ref 6–20)
CO2: 20 mmol/L — ABNORMAL LOW (ref 22–32)
Calcium: 9.1 mg/dL (ref 8.9–10.3)
Chloride: 107 mmol/L (ref 98–111)
Creatinine, Ser: 1.22 mg/dL (ref 0.61–1.24)
GFR, Estimated: >60 mL/min (ref >60)
Glucose, Bld: 143 mg/dL — ABNORMAL HIGH (ref 70–99)
Potassium: 4.5 mmol/L (ref 3.5–5.1)
Sodium: 138 mmol/L (ref 135–145)
Total Bilirubin: 0.5 mg/dL (ref 0.3–1.2)
Total Protein: 6.8 g/dL (ref 6.5–8.1)

## 2021-01-04 LAB — BRAIN NATRIURETIC PEPTIDE: B Natriuretic Peptide: 592.9 pg/mL — ABNORMAL HIGH (ref 0.0–100.0)

## 2021-01-04 LAB — LACTIC ACID, PLASMA
Lactic Acid, Venous: 2.1 mmol/L (ref 0.5–1.9)
Lactic Acid, Venous: 2.2 mmol/L (ref 0.5–1.9)

## 2021-01-04 LAB — DIGOXIN LEVEL: Digoxin Level: <0.2 ng/mL — ABNORMAL LOW (ref 0.8–2.0)

## 2021-01-04 LAB — TSH: TSH: 3.616 u[IU]/mL (ref 0.350–4.500)

## 2021-01-04 LAB — VALPROIC ACID LEVEL: Valproic Acid Lvl: 37 ug/mL — ABNORMAL LOW (ref 50.0–100.0)

## 2021-01-04 LAB — GLUCOSE, CAPILLARY
Glucose-Capillary: 88 mg/dL (ref 70–99)
Glucose-Capillary: 116 mg/dL — ABNORMAL HIGH (ref 70–99)

## 2021-01-04 MED ORDER — PNEUMOCOCCAL VAC POLYVALENT 25 MCG/0.5ML IJ INJ: 0.5000 mL | INJECTION | INTRAMUSCULAR | Status: DC

## 2021-01-04 MED ORDER — ENOXAPARIN SODIUM 60 MG/0.6ML ~~LOC~~ SOLN
55.0000 mg | SUBCUTANEOUS | Status: DC
  Administered 2021-01-04 – 2021-01-06 (×3): 55 mg via SUBCUTANEOUS
  Filled 2021-01-04 (×2): qty 0.6
  Filled 2021-01-04: qty 0.55
  Filled 2021-01-04: qty 0.6

## 2021-01-04 MED ORDER — TAMSULOSIN HCL 0.4 MG PO CAPS
0.4000 mg | ORAL_CAPSULE | Freq: Every day | ORAL | Status: DC
  Administered 2021-01-04 – 2021-01-06 (×3): 0.4 mg via ORAL
  Filled 2021-01-04 (×3): qty 1

## 2021-01-04 MED ORDER — ONDANSETRON HCL 4 MG/2ML IJ SOLN: 4.0000 mg | Freq: Four times a day (QID) | INTRAMUSCULAR | Status: DC | PRN

## 2021-01-04 MED ORDER — SPIRONOLACTONE 12.5 MG HALF TABLET
12.5000 mg | ORAL_TABLET | Freq: Every day | ORAL | Status: DC
  Administered 2021-01-04 – 2021-01-06 (×3): 12.5 mg via ORAL
  Filled 2021-01-04 (×3): qty 1

## 2021-01-04 MED ORDER — SERTRALINE HCL 50 MG PO TABS: 25.0000 mg | ORAL_TABLET | Freq: Every day | ORAL | Status: DC

## 2021-01-04 MED ORDER — FUROSEMIDE 10 MG/ML IJ SOLN
120.0000 mg | Freq: Two times a day (BID) | INTRAVENOUS | Status: DC
  Administered 2021-01-04 – 2021-01-05 (×3): 120 mg via INTRAVENOUS
  Filled 2021-01-04 (×3): qty 10
  Filled 2021-01-04 (×2): qty 12

## 2021-01-04 MED ORDER — DAPAGLIFLOZIN PROPANEDIOL 10 MG PO TABS
10.0000 mg | ORAL_TABLET | Freq: Every day | ORAL | Status: DC
Start: 2021-01-04 — End: 2021-01-07
  Administered 2021-01-04 – 2021-01-06 (×3): 10 mg via ORAL
  Filled 2021-01-04 (×3): qty 1

## 2021-01-04 MED ORDER — ACETAMINOPHEN 650 MG RE SUPP: 650.0000 mg | Freq: Four times a day (QID) | RECTAL | Status: DC | PRN

## 2021-01-04 MED ORDER — VANCOMYCIN HCL 2000 MG/400ML IV SOLN
2000.0000 mg | Freq: Once | INTRAVENOUS | Status: AC
  Administered 2021-01-04: 2000 mg via INTRAVENOUS
  Filled 2021-01-04: qty 400

## 2021-01-04 MED ORDER — VANCOMYCIN HCL 1750 MG/350ML IV SOLN: 1750.0000 mg | INTRAVENOUS | Status: DC

## 2021-01-04 MED ORDER — SACUBITRIL-VALSARTAN 24-26 MG PO TABS
1.0000 | ORAL_TABLET | Freq: Two times a day (BID) | ORAL | Status: DC
  Administered 2021-01-04 – 2021-01-06 (×5): 1 via ORAL
  Filled 2021-01-04 (×5): qty 1

## 2021-01-04 MED ORDER — INSULIN ASPART 100 UNIT/ML ~~LOC~~ SOLN
0.0000 [IU] | Freq: Three times a day (TID) | SUBCUTANEOUS | Status: DC
  Administered 2021-01-06: 2 [IU] via SUBCUTANEOUS

## 2021-01-04 MED ORDER — DIGOXIN 125 MCG PO TABS
125.0000 ug | ORAL_TABLET | Freq: Every day | ORAL | Status: DC
  Administered 2021-01-04 – 2021-01-06 (×3): 125 ug via ORAL
  Filled 2021-01-04 (×3): qty 1

## 2021-01-04 MED ORDER — FUROSEMIDE 10 MG/ML IJ SOLN
80.0000 mg | Freq: Once | INTRAMUSCULAR | Status: DC
  Filled 2021-01-04: qty 8

## 2021-01-04 MED ORDER — LEVOTHYROXINE SODIUM 100 MCG PO TABS
100.0000 ug | ORAL_TABLET | Freq: Every day | ORAL | Status: DC
  Administered 2021-01-05 – 2021-01-06 (×2): 100 ug via ORAL
  Filled 2021-01-04 (×2): qty 1

## 2021-01-04 MED ORDER — ACETAMINOPHEN 325 MG PO TABS: 650.0000 mg | ORAL_TABLET | Freq: Four times a day (QID) | ORAL | Status: DC | PRN

## 2021-01-04 MED ORDER — BRIMONIDINE TARTRATE 0.2 % OP SOLN
1.0000 [drp] | Freq: Two times a day (BID) | OPHTHALMIC | Status: DC
  Administered 2021-01-04 – 2021-01-06 (×5): 1 [drp] via OPHTHALMIC
  Filled 2021-01-04: qty 5

## 2021-01-04 MED ORDER — PERFLUTREN LIPID MICROSPHERE
1.0000 mL | INTRAVENOUS | Status: AC | PRN
  Administered 2021-01-04: 2 mL via INTRAVENOUS
  Filled 2021-01-04: qty 10

## 2021-01-04 MED ORDER — MELATONIN 3 MG PO TABS
3.0000 mg | ORAL_TABLET | Freq: Every day | ORAL | Status: DC
  Administered 2021-01-04 – 2021-01-06 (×3): 3 mg via ORAL
  Filled 2021-01-04 (×4): qty 1

## 2021-01-04 MED ORDER — ATORVASTATIN CALCIUM 10 MG PO TABS
10.0000 mg | ORAL_TABLET | Freq: Every day | ORAL | Status: DC
  Administered 2021-01-04 – 2021-01-06 (×3): 10 mg via ORAL
  Filled 2021-01-04 (×3): qty 1

## 2021-01-04 MED ORDER — FUROSEMIDE 10 MG/ML IJ SOLN
40.0000 mg | Freq: Once | INTRAMUSCULAR | Status: AC
  Administered 2021-01-04: 40 mg via INTRAVENOUS
  Filled 2021-01-04: qty 4

## 2021-01-04 MED ORDER — CYCLOBENZAPRINE HCL 10 MG PO TABS
10.0000 mg | ORAL_TABLET | Freq: Three times a day (TID) | ORAL | Status: DC
  Administered 2021-01-04 – 2021-01-06 (×8): 10 mg via ORAL
  Filled 2021-01-04 (×8): qty 1

## 2021-01-04 MED ORDER — SODIUM CHLORIDE 0.9 % IV SOLN
2.0000 g | Freq: Once | INTRAVENOUS | Status: AC
  Administered 2021-01-04: 2 g via INTRAVENOUS
  Filled 2021-01-04: qty 2

## 2021-01-04 MED ORDER — DIVALPROEX SODIUM 250 MG PO DR TAB: 500.0000 mg | DELAYED_RELEASE_TABLET | Freq: Two times a day (BID) | ORAL | Status: DC

## 2021-01-04 MED ORDER — RISPERIDONE 1 MG PO TABS
0.5000 mg | ORAL_TABLET | Freq: Every day | ORAL | Status: DC
  Administered 2021-01-04 – 2021-01-06 (×3): 0.5 mg via ORAL
  Filled 2021-01-04 (×3): qty 1

## 2021-01-04 MED ORDER — IOHEXOL 300 MG/ML  SOLN
100.0000 mL | Freq: Once | INTRAMUSCULAR | Status: AC | PRN
  Administered 2021-01-04: 100 mL via INTRAVENOUS

## 2021-01-04 MED ORDER — ONDANSETRON HCL 4 MG PO TABS: 4.0000 mg | ORAL_TABLET | Freq: Four times a day (QID) | ORAL | Status: DC | PRN

## 2021-01-04 MED ORDER — LATANOPROST 0.005 % OP SOLN
1.0000 [drp] | Freq: Every day | OPHTHALMIC | Status: DC
  Administered 2021-01-04 – 2021-01-06 (×3): 1 [drp] via OPHTHALMIC
  Filled 2021-01-04: qty 2.5

## 2021-01-04 MED ORDER — ENOXAPARIN SODIUM 40 MG/0.4ML ~~LOC~~ SOLN: 40.0000 mg | SUBCUTANEOUS | Status: DC

## 2021-01-04 MED ORDER — POTASSIUM CHLORIDE CRYS ER 20 MEQ PO TBCR: 40.0000 meq | EXTENDED_RELEASE_TABLET | Freq: Every day | ORAL | Status: DC

## 2021-01-04 MED ORDER — TRAMADOL HCL 50 MG PO TABS: 50.0000 mg | ORAL_TABLET | Freq: Two times a day (BID) | ORAL | Status: DC | PRN

## 2021-01-04 MED ORDER — LORAZEPAM 2 MG/ML IJ SOLN
0.5000 mg | Freq: Once | INTRAMUSCULAR | Status: AC
  Administered 2021-01-04: 0.5 mg via INTRAVENOUS
  Filled 2021-01-04: qty 1

## 2021-01-04 NOTE — Consult Note (Addendum)
Cardiology Consultation:   Patient ID: Charles [name] MRN: [id number]; DOB: 1966-09-29  Admit date: 01/04/2021 Date of Consult: 01/04/2021  PCP:  Patient, No Pcp Per   [facility]  Cardiologist:  [name], MD   Patient Profile:   Charles [name] is a 55 y.o. male with a hx of nonischemic cardiomyopathy, chronic systolic heart failure, diabetes mellitus, hypertension, recent diagnosis of Hodgkin's lymphoma and bipolar disorder with recent suicidal ideation who is being seen today for the evaluation of CHF at the request of Dr. Rebeca Alert.  Admitted December 2021 with acute systolic heart failure.  EF was 20 to 25%.  Cardiac cath with normal coronaries.   Seen by Dr. Roxan Hockey for mediastinal adenopathy and underwent robotic assisted lymph node biopsy and admission for CHF exacerbation.   Seen in heart failure clinic December 29, 2020 by Darrick Grinder, NP.  Added Lasix 40 mg daily and digoxin 0.125 mg daily.  Restarted spironolactone.  Plan to add ivabradine if remains tachycardic at follow-up.  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  Pending evaluation by radiation oncology.  History of Present Illness:   Charles [name] has worsening shortness of breath for past few days.  He described as "breathing really hard".  No chest pain.  Persistently feeling palpitation.  Has noted abdominal tightness and lower extremity edema.  He was tested positive for COVID-19 in January 2022 and had negative test on 12/22/2020.  He has diarrhea.  No fever or chills.  Due to worsening breathing EMS was called and brought to ER from assisted living facility.  He initially required BiPAP in the emergency room and IV Lasix 40 mg x 1.  Now breathing improved  BNP 592 High-sensitivity troponin 48>> 52 Lactic acid 2.1 WBC 12.1 Normal TSH Respiratory panel negative for influenza and Covid Checks x-ray concerning for bilateral  pneumonia, aspiration and CHF. CT angio of chest without pulmonary embolism however evidence of CHF and possible pneumonia.  He is started on broad-spectrum antibiotic.  Echo today  1. Technically difficult; definity used; right heart not well visualized.  2. Left ventricular ejection fraction, by estimation, is 20 to 25%. The  left ventricle has severely decreased function. The left ventricle  demonstrates global hypokinesis. The left ventricular internal cavity size  was severely dilated. Left ventricular  diastolic parameters are consistent with Grade III diastolic dysfunction  (restrictive).  3. Right ventricular systolic function is normal. The right ventricular  size is normal.  4. The mitral valve is normal in structure. Mild mitral valve  regurgitation. No evidence of mitral stenosis.  5. The aortic valve is tricuspid. Aortic valve regurgitation is trivial.  No aortic stenosis is present.  6. The inferior vena cava is normal in size with greater than 50%  respiratory variability, suggesting right atrial pressure of 3 mmHg.   Past Medical History:  Diagnosis Date  . Arthritis    lower back  . Bipolar 1 disorder ([place])   . Cardiomyopathy, nonischemic ([place])    no CAD 10/14/20 cath, EF 20-25% by 10/11/20 echo  . CHF (congestive heart failure) ([place])   . COVID 10/2020  . Depression   . DM2 (diabetes mellitus, type 2) ([place])   . HTN (hypertension)   . Myocardial infarction [facility])     Past Surgical History:  Procedure Laterality Date  . BACK SURGERY  01/2020  . CARDIAC CATHETERIZATION  10/2020  . CHOLECYSTECTOMY    . HERNIA  REPAIR    . INTERCOSTAL NERVE BLOCK Left 12/18/2020   Procedure: INTERCOSTAL NERVE BLOCK;  Surgeon: Melrose Nakayama, MD;  Location: [place];  Service: Thoracic;  Laterality: Left;  . LYMPH NODE BIOPSY Left 12/18/2020   Procedure: LYMPH NODE BIOPSY;  Surgeon: Melrose Nakayama, MD;  Location: [place];  Service: Thoracic;  Laterality: Left;  .  RIGHT/LEFT HEART CATH AND CORONARY ANGIOGRAPHY N/A 10/14/2020   Procedure: RIGHT/LEFT HEART CATH AND CORONARY ANGIOGRAPHY;  Surgeon: Jettie Booze, MD;  Location: [facility];  Service: Cardiovascular;  Laterality: N/A;  . SPINAL FIXATION SURGERY  02/26/2020  . XI ROBOTIC ASSISTED THORASCOPY FOR BIOPSY AP WINDOW LYMPH NODES (Left)  12/18/2020     Inpatient Medications: Scheduled Meds: . enoxaparin (LOVENOX) injection  55 mg Subcutaneous Q24H  . furosemide  80 mg Intravenous Once   Continuous Infusions: . vancomycin 2,000 mg (01/04/21 1043)   PRN Meds: acetaminophen **OR** acetaminophen, ondansetron **OR** ondansetron (ZOFRAN) IV, perflutren lipid microspheres (DEFINITY) IV suspension  Allergies:    Allergies  Allergen Reactions  . Ibuprofen   . Penicillins Rash    Social History:   Social History   Socioeconomic History  . Marital status: Divorced    Spouse name: Not on file  . Number of children: 1  . Years of education: Not on file  . Highest education level: Not on file  Occupational History  . Not on file  Tobacco Use  . Smoking status: Current Some Day Smoker    Packs/day: 0.25    Types: Cigarettes  . Smokeless tobacco: Never Used  . Tobacco comment: occasionally  Vaping Use  . Vaping Use: Never used  Substance and Sexual Activity  . Alcohol use: Never  . Drug use: Not Currently    Types: Cocaine  . Sexual activity: Not Currently  Other Topics Concern  . Not on file  Social History Narrative  . Not on file   Social Determinants of Health   Financial Resource Strain: Medium Risk  . Difficulty of Paying Living Expenses: Somewhat hard  Food Insecurity: No Food Insecurity  . Worried About Charity fundraiser in the Last Year: Never true  . Ran Out of Food in the Last Year: Never true  Transportation Needs: No Transportation Needs  . Lack of Transportation (Medical): No  . Lack of Transportation (Non-Medical): No  Physical Activity: Unknown   . Days of Exercise per Week: 0 days  . Minutes of Exercise per Session: Not on file  Stress: Not on file  Social Connections: Not on file  Intimate Partner Violence: Not on file    Family History:   Family History  Problem Relation Age of Onset  . Hypertension Mother   . Hyperlipidemia Mother   . Stroke Mother   . Cancer Mother   . Hypertension Father   . Hyperlipidemia Father   . Healthy Sister      ROS:  Please see the history of present illness.  All other ROS reviewed and negative.     Physical Exam/Data:   Vitals:   01/04/21 1115 01/04/21 1130 01/04/21 1145 01/04/21 1149  BP:    112/87  Pulse: (!) 116 (!) 117 (!) 115 (!) 115  Resp: (!) 23 (!) 32 (!) 31 (!) 36  Temp:    97.7 F (36.5 C)  TempSrc:    Oral  SpO2: 100% 100% 98% 94%  Weight:      Height:  Intake/Output Summary (Last 24 hours) at 01/04/2021 1156 Last data filed at 01/04/2021 1100 Gross per 24 hour  Intake 100 ml  Output 1650 ml  Net -1550 ml   Last 3 Weights 01/04/2021 01/01/2021 12/29/2020  Weight (lbs) 256 lb 6.3 oz 256 lb 4.8 oz 255 lb 8 oz  Weight (kg) 116.3 kg 116.257 kg 115.894 kg     Body mass index is 37.86 kg/m.  General: Ill-appearing male in no acute distress  HEENT: normal Lymph: no adenopathy Neck: no JVD Endocrine:  No thryomegaly Vascular: No carotid bruits; FA pulses 2+ bilaterally without bruits  Cardiac:  normal S1, S2 regular tachycardic; no murmur  Lungs: Faint rales with wheezing  abd: soft, distended, no hepatomegaly  Ext: Trace to 1+ edema Musculoskeletal:  No deformities, BUE and BLE strength normal and equal Skin: warm and dry  Neuro:  CNs 2-12 intact, no focal abnormalities noted Psych:  Normal affect   EKG:  The EKG was personally reviewed and demonstrates:  Sinus tachycardia at 126 bpm Telemetry:  Telemetry was personally reviewed and demonstrates: Sinus tachycardia at rate of 110 bpm  Relevant CV Studies:  RIGHT/LEFT HEART CATH AND CORONARY  ANGIOGRAPHY 10/14/20   Conclusion    LV end diastolic pressure is severely elevated. LVEDP 29 mm Hg.  There is no aortic valve stenosis.  No angiographically apparent CAD.  Ao sat 98%, PA sat 69%, PA pressure 34/22, mean 26 mm Hg; mean PCWP 26 mm Hg; CO 4.96 L/min; CI 2.09   Continue medical therapy for nonischemic cardiomyopathy.     Echo 10/11/20 1. Left ventricular ejection fraction, by estimation, is 20 to 25%. The  left ventricle has severely decreased function. The left ventricle  demonstrates global hypokinesis. The left ventricular internal cavity size  was severely dilated. Left ventricular  diastolic parameters are indeterminate.  2. Right ventricular systolic function was not well visualized. The right  ventricular size is not well visualized.  3. The mitral valve is normal in structure. No evidence of mitral valve  regurgitation.  4. The aortic valve was not well visualized. Aortic valve regurgitation  is not visualized. No aortic stenosis is present.   Laboratory Data:  High Sensitivity Troponin:   Recent Labs  Lab 12/20/20 0912 12/20/20 1304 01/04/21 0848 01/04/21 1025  TROPONINIHS 29* 34* 48* 52*     Chemistry Recent Labs  Lab 12/29/20 1524 01/01/21 1308 01/04/21 0507 01/04/21 0904  NA 141 140 138 142  K 5.2* 4.5 4.5 4.5  CL 106 107 107  --   CO2 23 24 20*  --   GLUCOSE 111* 106* 143*  --   BUN 13 12 12   --   CREATININE 1.02 1.22 1.22  --   CALCIUM 9.3 9.3 9.1  --   GFRNONAA >60 >60 >60  --   ANIONGAP 12 9 11   --     Recent Labs  Lab 01/01/21 1308 01/04/21 0507  PROT 7.3 6.8  ALBUMIN 3.4* 3.4*  AST 12* 25  ALT 23 28  ALKPHOS 132* 101  BILITOT 0.3 0.5   Hematology Recent Labs  Lab 01/01/21 1308 01/04/21 0507 01/04/21 0904  WBC 10.8* 12.1*  --   RBC 5.86* 5.85*  --   HGB 14.6 14.6 16.0  HCT 45.2 46.3 47.0  MCV 77.1* 79.1*  --   MCH 24.9* 25.0*  --   MCHC 32.3 31.5  --   RDW 17.3* 17.9*  --   PLT 247 292  --  BNP Recent Labs  Lab 12/29/20 1524 01/04/21 0507  BNP 559.7* 592.9*    Radiology/Studies:  CT Angio Chest PE W and/or Wo Contrast  Result Date: 01/04/2021 CLINICAL DATA:  55 year old male with history of shortness of breath when lying down. EXAM: CT ANGIOGRAPHY CHEST WITH CONTRAST TECHNIQUE: Multidetector CT imaging of the chest was performed using the standard protocol during bolus administration of intravenous contrast. Multiplanar CT image reconstructions and MIPs were obtained to evaluate the vascular anatomy. CONTRAST:  156mL OMNIPAQUE IOHEXOL 300 MG/ML  SOLN COMPARISON:  Chest CTA 11/29/2020. FINDINGS: Cardiovascular: Study is limited by considerable patient respiratory motion. With these limitations in mind, there is no central, lobar or segmental sized filling defects to suggest clinically significant pulmonary embolism. Smaller subsegmental sized pulmonary emboli cannot be entirely excluded on the basis of today's examination. Heart size is enlarged with left ventricular dilatation. There is no significant pericardial fluid, thickening or pericardial calcification. Aortic atherosclerosis. No definite coronary artery calcifications. Mediastinum/Nodes: Enlarged anterior mediastinal lymph nodes measuring up to 2.4 cm in short axis (axial image 48 of series 5). Multiple other prominent borderline enlarged mediastinal and hilar lymph nodes are also noted. Esophagus is unremarkable in appearance. No axillary lymphadenopathy. Lungs/Pleura: Widespread areas of ground-glass attenuation and interlobular septal thickening noted throughout the lungs bilaterally, the appearance of which is concerning for a background of pulmonary edema. In addition, in the periphery of the right upper lobe (axial image 43 of series 7) there is a more dense nodular area of apparent airspace consolidation measuring 2.3 x 1.6 cm. Small to moderate bilateral pleural effusions lying dependently. Upper Abdomen: Unremarkable.  Musculoskeletal: Status post laminectomy at T5-T6. There are no aggressive appearing lytic or blastic lesions noted in the visualized portions of the skeleton. Review of the MIP images confirms the above findings. IMPRESSION: 1. Despite the limitations of today's examination, there is no evidence to suggest clinically significant central, lobar or segmental sized pulmonary embolism. 2. Cardiomegaly with left ventricular dilatation, evidence of pulmonary edema and small to moderate bilateral pleural effusions; imaging findings concerning for congestive heart failure. 3. More nodular focus of probable airspace consolidation in the periphery of the right upper lobe measuring 2.3 x 1.6 cm. This is favored to be of infectious or inflammatory etiology. However, attention on short-term follow-up noncontrast chest CT is recommended in 3 months to ensure complete resolution of this finding, as the possibility of neoplasm is not entirely excluded. At the time of the follow-up imaging, attention should also be directed to the mediastinal lymphadenopathy noted on today's examination. This lymphadenopathy is nonspecific, and can be seen in the setting of congestive heart failure, however, the possibility of lymphoproliferative disorder or other neoplasm warrants of exclusion. 4. Aortic atherosclerosis. Aortic Atherosclerosis (ICD10-I70.0). Electronically Signed   By: Vinnie Langton M.D.   On: 01/04/2021 09:52   DG Chest Portable 1 View  Result Date: 01/04/2021 CLINICAL DATA:  55 year old male with shortness of breath. Abdominal pain. Status post left video-assisted thoracoscopy for biopsy of mediastinal lymph nodes earlier this month. EXAM: PORTABLE CHEST 1 VIEW COMPARISON:  Chest radiographs 12/21/2020 and earlier. FINDINGS: Portable AP upright view at 0431 hours. Right IJ central line earlier this month has been removed. Stable mediastinal contours. Mild cardiomegaly. Streaky asymmetric pulmonary opacity has increased in  both lungs but is greater on the right. No pneumothorax. No pleural effusion is evident. No air bronchograms identified. No acute osseous abnormality identified. Paucity of bowel gas in the upper abdomen. IMPRESSION: 1. New streaky  and asymmetric widespread pulmonary opacity, greater in the right lung. This is nonspecific with differential considerations including bilateral pneumonia, aspiration, asymmetric pulmonary edema. 2. Stable cardiomegaly. Electronically Signed   By: Genevie Ann M.D.   On: 01/04/2021 04:50   ECHOCARDIOGRAM COMPLETE  Result Date: 01/04/2021    ECHOCARDIOGRAM REPORT   Patient Name:   Charles [name] Date of Exam: 01/04/2021 Medical Rec #:  [id number]   Height:       69.0 in Accession #:    [id number]  Weight:       256.4 lb Date of Birth:  [birth year]    BSA:          2.296 m Patient Age:    80 years    BP:           104/51 mmHg Patient Gender: M           HR:           117 bpm. Exam Location:  Inpatient Procedure: 2D Echo, Cardiac Doppler, Color Doppler and Intracardiac            Opacification Agent STAT ECHO Indications:    Dyspnea R06.00  History:        Patient has prior history of Echocardiogram examinations, most                 recent 10/11/2020. Cardiomyopathy and CHF; Risk                 Factors:Hypertension, Diabetes and Current Smoker.  Sonographer:    Vickie Epley RDCS Referring Phys: [id number] Oda Kilts  Sonographer Comments: Technically difficult study due to poor echo windows. IMPRESSIONS  1. Technically difficult; definity used; right heart not well visualized.  2. Left ventricular ejection fraction, by estimation, is 20 to 25%. The left ventricle has severely decreased function. The left ventricle demonstrates global hypokinesis. The left ventricular internal cavity size was severely dilated. Left ventricular diastolic parameters are consistent with Grade III diastolic dysfunction (restrictive).  3. Right ventricular systolic function is normal. The right ventricular size is  normal.  4. The mitral valve is normal in structure. Mild mitral valve regurgitation. No evidence of mitral stenosis.  5. The aortic valve is tricuspid. Aortic valve regurgitation is trivial. No aortic stenosis is present.  6. The inferior vena cava is normal in size with greater than 50% respiratory variability, suggesting right atrial pressure of 3 mmHg. FINDINGS  Left Ventricle: Left ventricular ejection fraction, by estimation, is 20 to 25%. The left ventricle has severely decreased function. The left ventricle demonstrates global hypokinesis. Definity contrast agent was given IV to delineate the left ventricular endocardial borders. The left ventricular internal cavity size was severely dilated. There is no left ventricular hypertrophy. Left ventricular diastolic parameters are consistent with Grade III diastolic dysfunction (restrictive). Right Ventricle: The right ventricular size is normal. Right ventricular systolic function is normal. Left Atrium: Left atrial size was normal in size. Right Atrium: Right atrial size was normal in size. Pericardium: There is no evidence of pericardial effusion. Mitral Valve: The mitral valve is normal in structure. Mild mitral valve regurgitation. No evidence of mitral valve stenosis. Tricuspid Valve: The tricuspid valve is normal in structure. Tricuspid valve regurgitation is trivial. No evidence of tricuspid stenosis. Aortic Valve: The aortic valve is tricuspid. Aortic valve regurgitation is trivial. No aortic stenosis is present. Pulmonic Valve: The pulmonic valve was not well visualized. Pulmonic valve regurgitation is not visualized. No evidence of pulmonic stenosis. Aorta:  The aortic root is normal in size and structure. Venous: The inferior vena cava is normal in size with greater than 50% respiratory variability, suggesting right atrial pressure of 3 mmHg. IAS/Shunts: The interatrial septum was not well visualized. Additional Comments: Technically difficult; definity  used; right heart not well visualized.  LEFT VENTRICLE PLAX 2D LVIDd:         7.40 cm LVIDs:         6.70 cm LV PW:         0.70 cm LV IVS:        0.70 cm LVOT diam:     2.20 cm LV SV:         21 LV SV Index:   9 LVOT Area:     3.80 cm  RIGHT VENTRICLE TAPSE (M-mode): 1.2 cm LEFT ATRIUM           Index       RIGHT ATRIUM           Index LA diam:      2.80 cm 1.22 cm/m  RA Area:     15.40 cm LA Vol (A2C): 42.4 ml 18.47 ml/m RA Volume:   36.30 ml  15.81 ml/m LA Vol (A4C): 49.3 ml 21.47 ml/m  AORTIC VALVE LVOT Vmax:   46.00 cm/s LVOT Vmean:  34.300 cm/s LVOT VTI:    0.056 m  AORTA Ao Root diam: 3.50 cm MR Peak grad: 49.8 mmHg MR Mean grad: 31.0 mmHg   SHUNTS MR Vmax:      353.00 cm/s Systemic VTI:  0.06 m MR Vmean:     263.0 cm/s  Systemic Diam: 2.20 cm Kirk Ruths MD Electronically signed by Kirk Ruths MD Signature Date/Time: 01/04/2021/11:55:33 AM    Final      Assessment and Plan:   1. Acute on chronic combined CHF with Mild MR -Echo this morning showed LV function of 20 to 16%, grade 2 diastolic dysfunction, normal RV function and size. BNP minimally elevated around 500.  Breathing improved with diuresis and BiPAP (presently on 2 L [place]). -Plan to give Lasix 120mg  this afternoon. -Strict INO -Continue digoxin, Entresto and spironolactone - Can consider Ivabradine for sinus tachyardia as suggested   2.  Sepsis secondary to pneumonia -No pulmonary embolism on CTA of chest -On broad-spectrum antibiotic per primary team  3.  Hodgkin's lymphoma -Likely chemoradiation in near-term   Risk Assessment/Risk Scores:    New York Heart Association (NYHA) Functional Class NYHA Class IV  For questions or updates, please contact [facility] Please consult [email or web address] for contact info under    Jarrett Soho, PA  01/04/2021 11:56 AM   Personally seen and examined. Agree with APP above with the following comments or above corrections: Briefly 55 yo M with a history of  Hodkin's Lymphoma recently diagnosed with plan for chemotherapy; Morbid Obesity with DM NICM EF 20% with mild MR who has returned in decompensated HF from facility. Patient notes that 4-5 days after discharge having increase weight gain and increase SOB, with abdominal distension.  Always feels better with the mask type of oxygen. Exam notable for cool skin, abdominal distension with fluid wave, JVD to mid neck, cool skin, tachycardia, holosystolic murmur and weight gain from prior Labs notable for elevated BG check  Personally reviewed relevant tests; Echo shows MR and stable LVEF Would recommend: Lasiv 120 mg IV BID Return of patient's [place] unless primary is concerned for insulinopenia (ordered) Ivarbradine reasonable but will re-consider when euvolemic

## 2021-01-04 NOTE — ED Notes (Signed)
Nasal 02 by gems continued here

## 2021-01-04 NOTE — ED Provider Notes (Signed)
[facility] EMERGENCY DEPARTMENT Provider Note   CSN: [id number] Arrival date & time: 01/04/21  0354     History Chief Complaint  Patient presents with  . Abdominal Pain    Charles [name] is a 55 y.o. male.  HPI     This is a 55 year old male with a history of bipolar disorder, nonischemic cardiomyopathy, CHF, type 2 diabetes, hypertension, recent admission for biopsy for mediastinal adenopathy who presents with shortness of breath.  Patient reports her last several days he has had worsening shortness of breath and "breathing really hard."  Denies chest pain.  Denies any recent cough or fevers.  He has noted some increased abdominal girth and some lower extremity swelling.  He tested positive for COVID-19 in January.  He subsequently had negative COVID-19 test on 2/14.  At baseline he is not on home oxygen.  Currently requiring 2 L nasal cannula.  Past Medical History:  Diagnosis Date  . Arthritis    lower back  . Bipolar 1 disorder ([place])   . Cardiomyopathy, nonischemic ([place])    no CAD 10/14/20 cath, EF 20-25% by 10/11/20 echo  . CHF (congestive heart failure) ([place])   . COVID 10/2020  . Depression   . DM2 (diabetes mellitus, type 2) ([place])   . HTN (hypertension)   . Myocardial infarction [facility])     Patient Active Problem List   Diagnosis Date Noted  . Pulmonary edema   . Mediastinal adenopathy 12/18/2020  . Adjustment disorder with mixed anxiety and depressed mood 10/30/2020  . Acute systolic heart failure ([place])   . Mediastinal lymphadenopathy 10/11/2020  . Shortness of breath 10/11/2020  . Sinus tachycardia 10/11/2020  . Night sweats 10/11/2020  . DM2 (diabetes mellitus, type 2) ([place]) 10/11/2020  . Lymphoma ([place]) 10/11/2020  . Hypertension 09/25/2020  . BPH (benign prostatic hyperplasia) 09/25/2020  . Insomnia 09/25/2020    Past Surgical History:  Procedure Laterality Date  . BACK SURGERY  01/2020  . CARDIAC CATHETERIZATION  10/2020  .  CHOLECYSTECTOMY    . HERNIA REPAIR    . INTERCOSTAL NERVE BLOCK Left 12/18/2020   Procedure: INTERCOSTAL NERVE BLOCK;  Surgeon: Melrose Nakayama, MD;  Location: [facility];  Service: Thoracic;  Laterality: Left;  . LYMPH NODE BIOPSY Left 12/18/2020   Procedure: LYMPH NODE BIOPSY;  Surgeon: Melrose Nakayama, MD;  Location: [place];  Service: Thoracic;  Laterality: Left;  . RIGHT/LEFT HEART CATH AND CORONARY ANGIOGRAPHY N/A 10/14/2020   Procedure: RIGHT/LEFT HEART CATH AND CORONARY ANGIOGRAPHY;  Surgeon: Jettie Booze, MD;  Location: [facility] CV LAB;  Service: Cardiovascular;  Laterality: N/A;  . SPINAL FIXATION SURGERY  02/26/2020  . XI ROBOTIC ASSISTED THORASCOPY FOR BIOPSY AP WINDOW LYMPH NODES (Left)  12/18/2020       Family History  Problem Relation Age of Onset  . Hypertension Mother   . Hyperlipidemia Mother   . Stroke Mother   . Cancer Mother   . Hypertension Father   . Hyperlipidemia Father   . Healthy Sister     Social History   Tobacco Use  . Smoking status: Current Some Day Smoker    Packs/day: 0.25    Types: Cigarettes  . Smokeless tobacco: Never Used  . Tobacco comment: occasionally  Vaping Use  . Vaping Use: Never used  Substance Use Topics  . Alcohol use: Never  . Drug use: Not Currently    Types: Cocaine    Home Medications Prior to Admission medications  Medication Sig Start Date End Date Taking? Authorizing Provider  acetaminophen (TYLENOL) 325 MG tablet Take 650 mg by mouth every 6 (six) hours as needed for moderate pain.    [provider]  atorvastatin (LIPITOR) 10 MG tablet Take 10 mg by mouth daily. 09/30/20   [provider]  blood glucose meter kit and supplies KIT Dispense based on patient and insurance preference. Use up to four times daily as directed. (FOR ICD-9 250.00, 250.01). 10/23/20   Barb Merino, MD  brimonidine (ALPHAGAN) 0.2 % ophthalmic solution Place 1 drop into both eyes 2 (two) times daily.      [provider]  cyclobenzaprine (FLEXERIL) 10 MG tablet Take 10 mg by mouth 3 (three) times daily.    [provider]  dapagliflozin propanediol (FARXIGA) 10 MG TABS tablet Take 1 tablet (10 mg total) by mouth daily. 10/23/20   Barb Merino, MD  digoxin (LANOXIN) 0.125 MG tablet Take 1 tablet (125 mcg total) by mouth daily. 12/29/20 12/29/21  Clegg, Amy D, NP  divalproex (DEPAKOTE) 500 MG DR tablet Take 1 tablet (500 mg total) by mouth 2 (two) times daily. 12/14/20   Lucrezia Starch, MD  furosemide (LASIX) 40 MG tablet Take 1 tablet (40 mg total) by mouth daily. 12/29/20 12/29/21  Clegg, Amy D, NP  insulin detemir (LEVEMIR) 100 UNIT/ML FlexPen Inject 25 Units into the skin daily. 10/23/20   Barb Merino, MD  Insulin Pen Needle (PEN NEEDLES 29GX1/2") 29G X 12MM MISC 1 each by Does not apply route daily. 10/23/20   Barb Merino, MD  latanoprost (XALATAN) 0.005 % ophthalmic solution Place 1 drop into both eyes at bedtime.     [provider]  levothyroxine (SYNTHROID) 100 MCG tablet Take 100 mcg by mouth daily before breakfast.    [provider]  melatonin 3 MG TABS tablet Take 3 mg by mouth at bedtime. 09/08/20   [provider]  metoprolol succinate (TOPROL-XL) 100 MG 24 hr tablet Take 1 tablet (100 mg total) by mouth daily. Take with or immediately following a meal. 12/25/20   Roddenberry, Arlis Porta, PA-C  Paliperidone Palmitate (INVEGA SUSTENNA IM) Inject 1 pen into the muscle every 30 (thirty) days.    [provider]  polyethylene glycol (MIRALAX / GLYCOLAX) 17 g packet Take 17 g by mouth 2 (two) times daily. 10/23/20   Barb Merino, MD  potassium chloride SA (KLOR-CON) 20 MEQ tablet Take 2 tablets (40 mEq total) by mouth daily. 12/23/20   Gold, Patrick Jupiter E, PA-C  risperiDONE (RISPERDAL) 0.5 MG tablet Take 1 tablet (0.5 mg total) by mouth at bedtime. 12/14/20   Lucrezia Starch, MD  sacubitril-valsartan (ENTRESTO) 24-26 MG Take 1 tablet by mouth  2 (two) times daily.    [provider]  senna (SENOKOT) 8.6 MG TABS tablet Take 2 tablets (17.2 mg total) by mouth 2 (two) times daily. 10/23/20   Barb Merino, MD  sertraline (ZOLOFT) 25 MG tablet Take 1 tablet (25 mg total) by mouth daily. 12/14/20   Lucrezia Starch, MD  spironolactone (ALDACTONE) 25 MG tablet Take 0.5 tablets (12.5 mg total) by mouth daily. 12/29/20 12/29/21  Darrick Grinder D, NP  tamsulosin (FLOMAX) 0.4 MG CAPS capsule Take 0.4 mg by mouth daily. 09/08/20   [provider]  traMADol (ULTRAM) 50 MG tablet Take 1-2 tablets (50-100 mg total) by mouth every 12 (twelve) hours as needed (mild pain). 12/23/20   John Giovanni, PA-C    Allergies  Ibuprofen and Penicillins  Review of Systems   Review of Systems  Constitutional: Negative for fever.  Respiratory: Positive for shortness of breath. Negative for cough and wheezing.   Cardiovascular: Positive for leg swelling. Negative for chest pain.  Gastrointestinal: Negative for abdominal pain, nausea and vomiting.  Genitourinary: Negative for dysuria.  All other systems reviewed and are negative.   Physical Exam Updated Vital Signs BP 108/89   Pulse (!) 108   Temp 97.7 F (36.5 C)   Resp (!) 24   Ht 1.753 m (5' 9" )   Wt 116.3 kg   SpO2 93%   BMI 37.86 kg/m   Physical Exam Vitals and nursing note reviewed.  Constitutional:      Appearance: He is well-nourished. He is not toxic-appearing.  HENT:     Head: Normocephalic and atraumatic.     Mouth/Throat:     Mouth: Mucous membranes are moist.  Eyes:     Pupils: Pupils are equal, round, and reactive to light.  Cardiovascular:     Rate and Rhythm: Regular rhythm. Tachycardia present.     Heart sounds: Normal heart sounds. No murmur heard.   Pulmonary:     Effort: No respiratory distress.     Breath sounds: Rhonchi present. No wheezing.     Comments: Well-healing incisions left chest wall and flank, no crepitus, tachypnea and speaking in short  sentences Abdominal:     General: Abdomen is protuberant. Bowel sounds are normal.     Palpations: Abdomen is soft.     Tenderness: There is no abdominal tenderness. There is no rebound.  Genitourinary:    Testes:        Right: Swelling present.        Left: Swelling present.  Musculoskeletal:        General: No edema.     Cervical back: Neck supple.  Lymphadenopathy:     Cervical: No cervical adenopathy.  Skin:    General: Skin is warm and dry.  Neurological:     Mental Status: He is alert and oriented to person, place, and time.  Psychiatric:        Mood and Affect: Mood and affect normal.     ED Results / Procedures / Treatments   Labs (all labs ordered are listed, but only abnormal results are displayed) Labs Reviewed  CBC WITH DIFFERENTIAL/PLATELET - Abnormal; Notable for the following components:      Result Value   WBC 12.1 (*)    RBC 5.85 (*)    MCV 79.1 (*)    MCH 25.0 (*)    RDW 17.9 (*)    Lymphs Abs 4.4 (*)    All other components within normal limits  COMPREHENSIVE METABOLIC PANEL - Abnormal; Notable for the following components:   CO2 20 (*)    Glucose, Bld 143 (*)    Albumin 3.4 (*)    All other components within normal limits  BRAIN NATRIURETIC PEPTIDE - Abnormal; Notable for the following components:   B Natriuretic Peptide 592.9 (*)    All other components within normal limits  RESP PANEL BY RT-PCR (FLU A&B, COVID) [id number]    EKG None ED ECG REPORT   Date: 01/04/2021  Rate: 128  Rhythm: sinus tachycardia  QRS Axis: normal  Intervals: normal  ST/T Wave abnormalities: nonspecific T wave changes  Conduction Disutrbances:none  Narrative Interpretation:   Old EKG Reviewed: none available  I have personally reviewed the EKG tracing and agree with the computerized printout as  noted.  Radiology DG Chest Portable 1 View  Result Date: 01/04/2021 CLINICAL DATA:  55 year old male with shortness of breath. Abdominal pain. Status post left  video-assisted thoracoscopy for biopsy of mediastinal lymph nodes earlier this month. EXAM: PORTABLE CHEST 1 VIEW COMPARISON:  Chest radiographs 12/21/2020 and earlier. FINDINGS: Portable AP upright view at 0431 hours. Right IJ central line earlier this month has been removed. Stable mediastinal contours. Mild cardiomegaly. Streaky asymmetric pulmonary opacity has increased in both lungs but is greater on the right. No pneumothorax. No pleural effusion is evident. No air bronchograms identified. No acute osseous abnormality identified. Paucity of bowel gas in the upper abdomen. IMPRESSION: 1. New streaky and asymmetric widespread pulmonary opacity, greater in the right lung. This is nonspecific with differential considerations including bilateral pneumonia, aspiration, asymmetric pulmonary edema. 2. Stable cardiomegaly. Electronically Signed   By: Genevie Ann M.D.   On: 01/04/2021 04:50    Procedures .Critical Care Performed by: Merryl Hacker, MD Authorized by: Merryl Hacker, MD   Critical care provider statement:    Critical care time (minutes):  45   Critical care was necessary to treat or prevent imminent or life-threatening deterioration of the following conditions:  Cardiac failure and respiratory failure   Critical care was time spent personally by me on the following activities:  Discussions with consultants, evaluation of patient's response to treatment, examination of patient, ordering and performing treatments and interventions, ordering and review of laboratory studies, ordering and review of radiographic studies, pulse oximetry, re-evaluation of patient's condition, obtaining history from patient or surrogate and review of old charts      EMERGENCY DEPARTMENT [place] CARDIAC EXAM "Study: Limited Ultrasound of the Heart and Pericardium"  INDICATIONS:Abnormal vital signs and Dyspnea Multiple views of the heart and pericardium were obtained in real-time with a multi-frequency  probe.  PERFORMED EY:CXKGYJ IMAGES ARCHIVED?: No LIMITATIONS:  Body habitus and Emergent procedure VIEWS USED: Parasternal long axis and Parasternal short axis INTERPRETATION: Cardiac activity absent, Pericardial effusioin absent and Cardiac tamponade absent  Medications Ordered in ED Medications - No data to display  ED Course  I have reviewed the triage vital signs and the nursing notes.  Pertinent labs & imaging results that were available during my care of the patient were reviewed by me and considered in my medical decision making (see chart for details).    MDM Rules/Calculators/A&P                          Patient presents with shortness of breath.  He is tachypneic on exam with increased work of breathing and speaking in short sentences.  He has some lower extremity edema but is not overtly volume overloaded.  Recent history of biopsy of mediastinal adenopathy.  He had a chest tube at that time.  He is not had any fevers to suggest pneumonia.  Labs were obtained.  Mild leukocytosis.  The [facility] panel is reassuring.  BNP is 592.  Chest x-ray shows some new streaky opacities of the bilateral lung with broad differential including pneumonia, aspiration, asymmetric pulmonary edema.  Given his recent biopsy, will obtain repeat CT scan to better differentiate.  [name] stable on nasal cannula but is requiring supplemental oxygen.  He will need admission.  I did do a quick bedside ultrasound that did not show any obvious pericardial effusion or evidence of cardiac tamponade.  Final Clinical Impression(s) / ED Diagnoses Final diagnoses:  Hypoxia  SOB (shortness of  breath)    Rx / DC Orders ED Discharge Orders    None       Cael Worth, Barbette Hair, MD 01/04/21 [phone]

## 2021-01-04 NOTE — H&P (Addendum)
Date: 01/04/2021               Patient Name:  Charles [name] MRN: [id number]  DOB: 1965/12/10 Age / Sex: 55 y.o., male   PCP: Patient, No Pcp Per         Medical Service: Internal Medicine Teaching Service         Attending Physician: Dr. Rebeca Alert, Raynaldo Opitz, MD    First Contact: Dr. Alfonse Spruce Pager: [phone]  Second Contact: Dr. Laural Golden Pager: [phone]       After Hours (After 5p/  First Contact Pager: [phone]  weekends / holidays): Second Contact Pager: [phone]   Chief Complaint: Dyspnea   History of Present Illness:   Charles [name] is a 55 year old gentleman with past medical history of HFrEF, type 2 diabetes, hypertension, hyperlipidemia, paranoid schizophrenia, newly diagnosed Hodgkin's lymphoma, who presented to the ED for worsening shortness of breath.  Per patient, he started feeling more short of breath with hyperventilation a few days after he got out of the hospital.  Endorses orthopnea that he is having trouble sleeping at night.  Also noticed 30 pounds weight gain since December.  Endorses swelling of his abdomen.  States that his LE edema is stable.  Endorses intermittent dry cough with no wheezing, fever, nausea, vomiting.  Also denies chest pain.  Does not use oxygen at home.  Does not use inhalers at home.  Denies missing any medications. Does notice he urinates a lot sometimes after taking lasix. He does not eat much. No changes in his diet recently. He lives in an ALF and they cook for him there. No recent illnesses.  Patient also endorses diarrhea that started after his recent hospitalization.  Meds:  Current Meds  Medication Sig   acetaminophen (TYLENOL) 325 MG tablet Take 650 mg by mouth every 6 (six) hours as needed for moderate pain.   atorvastatin (LIPITOR) 10 MG tablet Take 10 mg by mouth daily.   brimonidine (ALPHAGAN) 0.2 % ophthalmic solution Place 1 drop into both eyes 2 (two) times daily.    cyclobenzaprine (FLEXERIL) 10 MG tablet Take 10 mg by mouth 3  (three) times daily.   dapagliflozin propanediol (FARXIGA) 10 MG TABS tablet Take 1 tablet (10 mg total) by mouth daily.   digoxin (LANOXIN) 0.125 MG tablet Take 1 tablet (125 mcg total) by mouth daily.   furosemide (LASIX) 40 MG tablet Take 1 tablet (40 mg total) by mouth daily.   insulin detemir (LEVEMIR) 100 UNIT/ML FlexPen Inject 25 Units into the skin daily.   Insulin Pen Needle (PEN NEEDLES 29GX1/2") 29G X 12MM MISC 1 each by Does not apply route daily.   latanoprost (XALATAN) 0.005 % ophthalmic solution Place 1 drop into both eyes at bedtime.    levothyroxine (SYNTHROID) 100 MCG tablet Take 100 mcg by mouth daily before breakfast.   melatonin 3 MG TABS tablet Take 3 mg by mouth at bedtime.   metoprolol succinate (TOPROL-XL) 50 MG 24 hr tablet Take 75 mg by mouth at bedtime. Take with or immediately following a meal.   polyethylene glycol (MIRALAX / GLYCOLAX) 17 g packet Take 17 g by mouth 2 (two) times daily.   risperiDONE (RISPERDAL) 0.5 MG tablet Take 1 tablet (0.5 mg total) by mouth at bedtime.   sacubitril-valsartan (ENTRESTO) 24-26 MG Take 1 tablet by mouth 2 (two) times daily.   senna (SENOKOT) 8.6 MG TABS tablet Take 2 tablets (17.2 mg total) by mouth 2 (two) times daily.  spironolactone (ALDACTONE) 25 MG tablet Take 0.5 tablets (12.5 mg total) by mouth daily.   tamsulosin (FLOMAX) 0.4 MG CAPS capsule Take 0.4 mg by mouth daily.     Allergies: Allergies as of 01/04/2021 - Review Complete 01/04/2021  Allergen Reaction Noted   Ibuprofen  11/09/2011   Penicillins Rash 11/09/2011   Past Medical History:  Diagnosis Date   Arthritis    lower back   Bipolar 1 disorder (HCC)    Cardiomyopathy, nonischemic ([place])    no CAD 10/14/20 cath, EF 20-25% by 10/11/20 echo   CHF (congestive heart failure) ([place])    COVID 10/2020   Depression    DM2 (diabetes mellitus, type 2) (HCC)    HTN (hypertension)    Myocardial infarction [facility])     Family History:  Family History  Problem  Relation Age of Onset   Hypertension Mother    Hyperlipidemia Mother    Stroke Mother    Cancer Mother    Hypertension Father    Hyperlipidemia Father    Healthy Sister      Social History: Smokes 2 cigarettes a day, for a couple weeks. He smokes on and off.  Stopped alcohol 30 some years ago. Denies illicit drug use, has been drug-free for 9 years. He walks a little, mostly uses a walker.   Review of Systems: A complete ROS was negative except as per HPI.   Physical Exam: Blood pressure 103/84, pulse (!) 113, temperature 98.1 F (36.7 C), temperature source Oral, resp. rate 20, height 5\' 9"  (1.753 m), weight 116.3 kg, SpO2 98 %.  Physical Exam Constitutional:      General: He is in acute distress.     Appearance: He is obese. He is ill-appearing.  HENT:     Head: Normocephalic.  Eyes:     General:        Right eye: No discharge.        Left eye: No discharge.  Cardiovascular:     Rate and Rhythm: Regular rhythm. Tachycardia present.  Pulmonary:     Breath sounds: No wheezing.     Comments: Crackles at bilateral lung bases.  Hyperventilating Abdominal:     General: Bowel sounds are normal. There is distension.     Tenderness: There is no abdominal tenderness.  Musculoskeletal:     Right lower leg: Edema (+1) present.     Left lower leg: Edema (+1) present.  Neurological:     Mental Status: He is alert.  Psychiatric:        Mood and Affect: Mood normal.     EKG: personally reviewed my interpretation is sinus tach  CXR: personally reviewed my interpretation is bilateral opacity, consistent with CHF  CTA: 1. Despite the limitations of today's examination, there is no evidence to suggest clinically significant central, lobar or segmental sized pulmonary embolism. 2. Cardiomegaly with left ventricular dilatation, evidence of pulmonary edema and small to moderate bilateral pleural effusions; imaging findings concerning for congestive heart failure. 3. More nodular  focus of probable airspace consolidation in the periphery of the right upper lobe measuring 2.3 x 1.6 cm. This is favored to be of infectious or inflammatory etiology. However, attention on short-term follow-up noncontrast chest CT is recommended in 3 months to ensure complete resolution of this finding, as the possibility of neoplasm is not entirely excluded. At the time of the follow-up imaging, attention should also be directed to the mediastinal lymphadenopathy noted on today's examination. This lymphadenopathy is nonspecific, and can be  seen in the setting of congestive heart failure, however, the possibility of lymphoproliferative disorder or other neoplasm warrants of exclusion. 4. Aortic atherosclerosis.  Assessment & Plan by Problem: Active Problems:   Acute exacerbation of congestive heart failure (HCC)  Adeel Guiffre is a 54 year old gentleman with past medical history of HFrEF, type 2 diabetes, hypertension, hyperlipidemia, paranoid schizophrenia, newly diagnosed Hodgkin's lymphoma, who presented to the ED for dypsnea from an acute on chronic heart failure exacerbation.   Acute heart failure exacerbation Patient was diagnosed with HFrEF in Dec 2021 with EF 20-25%.  Right/left heart cath showed nonischemic cardiomyopathy.  Unclear etiology at this time.  He denies history of alcohol or drug use. ?lymphoma related.  [name] following heart failure clinic and last appointment was 5 days ago. Patient presented to the ED with symptoms consistent with an acute heart failure exacerbation and elevated BNP. No concern for pneumonia.  His trigger is likely secondary to robotic assisted mediastinum biopsy procedure in 2/10, which he was intubated.  Patient reports compliant with his medication and no change in diet recently. Troponin not consistent with ACS. Repeat echo shows stable EF 95-63%, grade 3 diastolic with no evidence of cardiac tamponade.  Cardiology was consulted and recommended IV  Lasix 120 mg, which he produced 2L of UO.  -Appreciate cardiology recommendations -Continue Entresto, spironolactone, digoxin, and Farxiga -Currently holding beta-blocker due to slightly elevated lactic acid. -Consider adding ivabradine if remains tachycardic -Continue Lasix 120 mg twice daily -Discontinue antibiotics for low suspicion of pneumonia given no clinical symptoms -Strict I/O -Daily weight -Patient will need assessment for an AICD because his EF did not improved after 3 months.    Diarrhea Abdominal distention Patient reports diarrhea after meals which started after his recent hospitalization.  Covid test came back negative.  Given his abdominal distention, obtain abdominal ultrasound to evaluate for his liver and possible ascites.   Hodgkin lymphoma His mediastinal biopsy came back consistent with Hodgkin lymphoma.  Has not started treatment yet.  He has a follow-up appointment with Rad Onc on 01/07/2021. -Follow-up with oncology     Hyperlipidemia Continue Lipitor 10 mg daily   Type 2 diabetes Last A1c 8.1 in December.  Home regimen include Farxiga and Levemir 25 units daily. -Continue Farxiga -Sliding scale insulin -Monitor CBG   Hypothyroidism TSH within goal.  Pending T4 -Continue levothyroxine   Schizophrenia -Continue risperidone   Code: Full Diet: Heart healthy/carb modified IVF: None DVT: Lovenox  Dispo: Admit patient to Inpatient with expected length of stay greater than 2 midnights.  Signed: Gaylan Gerold, DO 01/04/2021, 3:15 PM  Pager: [phone] After 5pm on weekdays and 1pm on weekends: On Call pager: [phone]

## 2021-01-04 NOTE — ED Triage Notes (Signed)
[facility] home he is c/o abd pain and some sob  He appears anxious

## 2021-01-04 NOTE — ED Notes (Signed)
Patient transported to CT 

## 2021-01-04 NOTE — Progress Notes (Signed)
  Echocardiogram 2D Echocardiogram has been performed.  Geoffery Lyons Swaim 01/04/2021, 11:45 AM

## 2021-01-04 NOTE — Progress Notes (Signed)
Pharmacy Antibiotic Note  Charles [name] is a 55 y.o. male admitted on 01/04/2021 with pneumonia.  Pharmacy has been consulted for Vancomycin dosing.   Height: 5\' 9"  (175.3 cm) Weight: 116.3 kg (256 lb 6.3 oz) IBW/kg (Calculated) : 70.7  Temp (24hrs), Avg:97.7 F (36.5 C), Min:97.7 F (36.5 C), Max:97.7 F (36.5 C)  Recent Labs  Lab 12/29/20 1524 01/01/21 1308 01/04/21 0507  WBC  --  10.8* 12.1*  CREATININE 1.02 1.22 1.22    Estimated Creatinine Clearance: 85 mL/min (by C-G formula based on SCr of 1.22 mg/dL).    Allergies  Allergen Reactions  . Ibuprofen   . Penicillins Rash    Antimicrobials this admission: 2/27 Vancomycin >>   Dose adjustments this admission: N/a  Microbiology results: Pending   Plan:  - Vancomycin 2000mg  IV x 1 dose  - Followed by Vancomycin 1750mg  IV q24h - Est Calc AUC 497 - Monitor patients renal function and urine output  - De-escalate ABX when appropriate   Thank you for allowing pharmacy to be a part of this patient's care.  Duanne Limerick PharmD. BCPS 01/04/2021 9:39 AM

## 2021-01-04 NOTE — Progress Notes (Signed)
Pt arrived to unit from ED. Pt ambulated from stretcher to bed. Orders to be signed and released by RN.

## 2021-01-04 NOTE — Progress Notes (Signed)
Lab called regarding critical PO2. Will inform provider.

## 2021-01-04 NOTE — ED Provider Notes (Addendum)
As shift change, plan is for CT chest to help differentiate etiology of dyspnea and chest x-ray findings.  Anticipate admission.  CT informed me the patient was too dyspneic lying flat to proceed with CT scan.  08: 20 recheck of patient in the room.  [name] sitting in a chair at the bedside.  At this time, he does not have on any supplemental oxygen.  He is tachypneic, alert, speaking in full short sentences.  Heart rate regular narrow complex sinus appearance 120s on the monitor.  Oxygen saturation 88 to 93% on room air.  Lungs have fine crackles at the bases to midlung fields.  Patient's abdomen is moderately protuberant but soft without guarding.  Low her extremities without significant edema.  Calves soft and pliable.  [name] very anxious and complaining of being severely dyspneic.  He reports he needs something done.  He is maintaining his own airway without difficulty.  He is hyperventilating.  He does have hypoxia from high 80s to low 90s.  Patient placed on a nonrebreather mask for comfort and hypoxia.  Patient subjectively reports being much more comfortable with a nonrebreather mask.  Crackles present and chest x-ray with streaking, will administer Lasix for suspected volume overload.  We will also give 0.5 Ativan for anxiety.  If patient exhibits significant improvement with these interventions, will reattempt CT scan as planned.  Plan for admission for hypoxia with dyspnea and complex medical history.  At this time, I have high suspicion for CHF as etiology for hypoxia.  Will add lactic, blood cultures for possible infectious etiology with chest x-ray showing asymmetric streaking opacities.  Will add empiric HCAP antibiotics.  Check 08: 50 patient reports that he feels significantly better at this time and thinks he will be fine to do the CT.  Will reattempt CT as planned Physical Exam  BP (!) 112/48   Pulse (!) 132   Temp 97.7 F (36.5 C)   Resp (!) 36   Ht 5\' 9"  (1.753 m)   Wt  116.3 kg   SpO2 91%   BMI 37.86 kg/m   Physical Exam Constitutional:      Comments: Sitting upright in chair beside stretcher.  Tachypneic.  Alert.  Speaking in short full sentences.  HENT:     Mouth/Throat:     Pharynx: Oropharynx is clear.  Cardiovascular:     Comments: Tachycardia.  Distant heart sounds.  Regular. Pulmonary:     Comments: Moderate increased work of breathing with tachypnea.  Crackles bilateral bases Abdominal:     Comments: Abdomen protuberant but soft without guarding.  Musculoskeletal:        General: No swelling or tenderness. Normal range of motion.     Cervical back: Neck supple.     Right lower leg: No edema.     Left lower leg: No edema.  Skin:    General: Skin is warm and dry.  Neurological:     General: No focal deficit present.     Mental Status: He is oriented to person, place, and time.     Coordination: Coordination normal.  Psychiatric:     Comments: Moderately anxious.     ED Course/Procedures     Procedures CRITICAL CARE Performed by: Charlesetta Shanks   Total critical care time: 30 minutes  Critical care time was exclusive of separately billable procedures and treating other patients.  Critical care was necessary to treat or prevent imminent or life-threatening deterioration.  Critical care was time spent personally  by me on the following activities: development of treatment plan with patient and/or surrogate as well as nursing, discussions with consultants, evaluation of patient's response to treatment, examination of patient, obtaining history from patient or surrogate, ordering and performing treatments and interventions, ordering and review of laboratory studies, ordering and review of radiographic studies, pulse oximetry and re-evaluation of patient's condition. MDM  Plan for admission.  Will start empiric HCAP antibiotics and diuresis      Charlesetta Shanks, MD 01/04/21 0321    Charlesetta Shanks, MD 01/04/21 [phone]

## 2021-01-05 ENCOUNTER — Inpatient Hospital Stay (HOSPITAL_COMMUNITY): Payer: Medicaid Other

## 2021-01-05 DIAGNOSIS — I5023 Acute on chronic systolic (congestive) heart failure: Secondary | ICD-10-CM | POA: Diagnosis not present

## 2021-01-05 DIAGNOSIS — I10 Essential (primary) hypertension: Secondary | ICD-10-CM

## 2021-01-05 LAB — HEMOGLOBIN A1C
Hgb A1c MFr Bld: 7.4 % — ABNORMAL HIGH (ref 4.8–5.6)
Mean Plasma Glucose: 165.68 mg/dL

## 2021-01-05 LAB — BASIC METABOLIC PANEL
Anion gap: 12 (ref 5–15)
BUN: 16 mg/dL (ref 6–20)
CO2: 26 mmol/L (ref 22–32)
Calcium: 9.3 mg/dL (ref 8.9–10.3)
Chloride: 103 mmol/L (ref 98–111)
Creatinine, Ser: 1.15 mg/dL (ref 0.61–1.24)
GFR, Estimated: >60 mL/min (ref >60)
Glucose, Bld: 112 mg/dL — ABNORMAL HIGH (ref 70–99)
Potassium: 4 mmol/L (ref 3.5–5.1)
Sodium: 141 mmol/L (ref 135–145)

## 2021-01-05 LAB — CBC
HCT: 45.9 % (ref 39.0–52.0)
Hemoglobin: 14.7 g/dL (ref 13.0–17.0)
MCH: 25.1 pg — ABNORMAL LOW (ref 26.0–34.0)
MCHC: 32 g/dL (ref 30.0–36.0)
MCV: 78.5 fL — ABNORMAL LOW (ref 80.0–100.0)
Platelets: 247 10*3/uL (ref 150–400)
RBC: 5.85 MIL/uL — ABNORMAL HIGH (ref 4.22–5.81)
RDW: 18.2 % — ABNORMAL HIGH (ref 11.5–15.5)
WBC: 10.7 10*3/uL — ABNORMAL HIGH (ref 4.0–10.5)
nRBC: 0 % (ref 0.0–0.2)

## 2021-01-05 LAB — GLUCOSE, CAPILLARY
Glucose-Capillary: 112 mg/dL — ABNORMAL HIGH (ref 70–99)
Glucose-Capillary: 117 mg/dL — ABNORMAL HIGH (ref 70–99)
Glucose-Capillary: 118 mg/dL — ABNORMAL HIGH (ref 70–99)
Glucose-Capillary: 136 mg/dL — ABNORMAL HIGH (ref 70–99)

## 2021-01-05 LAB — T4: T4, Total: 7.2 ug/dL (ref 4.5–12.0)

## 2021-01-05 MED ORDER — METOPROLOL SUCCINATE ER 50 MG PO TB24
75.0000 mg | ORAL_TABLET | Freq: Every day | ORAL | Status: DC
  Administered 2021-01-05 – 2021-01-06 (×2): 75 mg via ORAL
  Filled 2021-01-05 (×3): qty 1

## 2021-01-05 MED ORDER — METOPROLOL SUCCINATE ER 50 MG PO TB24: 75.0000 mg | ORAL_TABLET | Freq: Every day | ORAL | Status: DC

## 2021-01-05 NOTE — Progress Notes (Signed)
   01/05/21 1922  Assess: MEWS Score  Temp 98.5 F (36.9 C)  BP 100/64  Pulse Rate (!) 120  Resp 18  SpO2 94 %  O2 Device Room Air  Assess: MEWS Score  MEWS Temp 0  MEWS Systolic 1  MEWS Pulse 2  MEWS RR 0  MEWS LOC 0  MEWS Score 3  MEWS Score Color Yellow  Assess: if the MEWS score is Yellow or Red  Were vital signs taken at a resting state? Yes  Focused Assessment No change from prior assessment  Early Detection of Sepsis Score *See Row Information* Low  MEWS guidelines implemented *See Row Information* No, previously yellow, continue vital signs every 4 hours  Treat  Pain Scale 0-10  Pain Score 0  Escalate  MEWS: Escalate Yellow: discuss with charge nurse/RN and consider discussing with provider and RRT  Notify: Charge Nurse/RN  Name of Charge Nurse/RN Notified Dalas RN  Date Charge Nurse/RN Notified 01/05/21  Time Charge Nurse/RN Notified 1935  Document  Patient Outcome Not stable and remains on department  Progress note created (see row info) Yes

## 2021-01-05 NOTE — Evaluation (Signed)
Occupational Therapy Evaluation Patient Details Name: Charles [name] MRN: [id number] DOB: May 15, 1966 Today's Date: 01/05/2021    History of Present Illness Reyli Schroth is a 55 year old gentleman with past medical history of HFrEF, type 2 diabetes, hypertension, hyperlipidemia, paranoid schizophrenia, newly diagnosed Hodgkin's lymphoma, who presented to the ED for worsening shortness of breath. Admitted with acute herat failure exacerbation.   Clinical Impression   Pt PTA: Pt from ALF. Pt reports independence for ADL with reacher/sock aid and seated for most LB ADL tasks. Pt currently, performing ADL routine at commode and sink. Pt requires assist for LB ADL dressing and bathing at baseline. Pt ambulating to bathroom with RW with supervisionA today. Pt with no LOB episodes. Pt performing grooming tasks at sink with no physical assist required. Pt no c/o SOB. O2 sats >90%: HR 117 BPM with exertion and BP remains stable.  Pt does not require continued OT skilled services. OT signing off. Thank you.    Follow Up Recommendations  No OT follow up;Supervision - Intermittent    Equipment Recommendations  None recommended by OT    Recommendations for Other Services       Precautions / Restrictions Precautions Precautions: Fall Restrictions Weight Bearing Restrictions: No      Mobility Bed Mobility Overal bed mobility: Independent                  Transfers Overall transfer level: Needs assistance Equipment used: Rolling walker (2 wheeled) Transfers: Sit to/from Stand Sit to Stand: Supervision              Balance Overall balance assessment: Mild deficits observed, not formally tested                                         ADL either performed or assessed with clinical judgement   ADL Overall ADL's : At baseline;Modified independent                                       General ADL Comments: No physical assist required. Pt reports using  sock aid for LB ADL and reacher. Pt reports sitting down for bathing tasks.     Vision Baseline Vision/History: No visual deficits Patient Visual Report: No change from baseline Vision Assessment?: No apparent visual deficits     Perception     Praxis      Pertinent Vitals/Pain Pain Assessment: No/denies pain     Hand Dominance Right   Extremity/Trunk Assessment Upper Extremity Assessment Upper Extremity Assessment: Overall WFL for tasks assessed   Lower Extremity Assessment Lower Extremity Assessment: Overall WFL for tasks assessed   Cervical / Trunk Assessment Cervical / Trunk Assessment: Normal   Communication Communication Communication: No difficulties   Cognition Arousal/Alertness: Awake/alert Behavior During Therapy: WFL for tasks assessed/performed Overall Cognitive Status: Within Functional Limits for tasks assessed                                 General Comments: Alert and oriented; takes time for processing. pt appears close to baseline   General Comments  O2 sats >90%: HR 117 BPM with exertion and BP remains stable.    Exercises     Shoulder Instructions      Home Living  Family/patient expects to be discharged to:: Assisted living Living Arrangements: Alone                           Home Equipment: Walker - 4 wheels          Prior Functioning/Environment Level of Independence: Needs assistance  Gait / Transfers Assistance Needed: using Rollator ADL's / Homemaking Assistance Needed: walks to dining hall for meals, staff assist for medications, independent ADL's            OT Problem List:        OT Treatment/Interventions:      OT Goals(Current goals can be found in the care plan section) Acute Rehab OT Goals Patient Stated Goal: to go back to ALF OT Goal Formulation: All assessment and education complete, DC therapy Potential to Achieve Goals: Good  OT Frequency:     Barriers to D/C:             Co-evaluation              AM-PAC OT "6 Clicks" Daily Activity     Outcome Measure Help from another person eating meals?: None Help from another person taking care of personal grooming?: None Help from another person toileting, which includes using toliet, bedpan, or urinal?: A Little Help from another person bathing (including washing, rinsing, drying)?: A Little Help from another person to put on and taking off regular upper body clothing?: None Help from another person to put on and taking off regular lower body clothing?: A Little 6 Click Score: 21   End of Session Equipment Utilized During Treatment: Rolling walker Nurse Communication: Mobility status  Activity Tolerance: Patient tolerated treatment well Patient left: Other (comment) (up in w/c for transport to take to CT)  OT Visit Diagnosis: Unsteadiness on feet (R26.81)                Time: 0092-3300 OT Time Calculation (min): 17 min Charges:  OT General Charges $OT Visit: 1 Visit OT Evaluation $OT Eval Moderate Complexity: 1 Mod  Jefferey Pica, OTR/L Acute Rehabilitation Services Pager: [phone] Office: [phone]   Yair Dusza C 01/05/2021, 11:59 AM

## 2021-01-05 NOTE — Progress Notes (Signed)
HD#1 Subjective:  Overnight Events: No acute events    Patient sitting in chair on approach. He reports walking to nursing station without difficulty. We discussed plan to continue removing fluid today and possible discharge soon. Denies any diarrhea since admission.   Objective:  Vital signs in last 24 hours: Vitals:   01/05/21 0500 01/05/21 0759 01/05/21 0826 01/05/21 1222  BP:  119/81 104/64 (!) 109/92  Pulse:  97 (!) 103 (!) 113  Resp:  20 20 20   Temp:   97.9 F (36.6 C) 98.1 F (36.7 C)  TempSrc:   Oral Oral  SpO2:  99% 100% 97%  Weight: 114.5 kg     Height:       Supplemental O2: Nasal Cannula SpO2: 97 % O2 Flow Rate (L/min): 3 L/min FiO2 (%): 40 %   Physical Exam:  Physical Exam Constitutional:      General: He is not in acute distress. HENT:     Head: Normocephalic.  Eyes:     General:        Right eye: No discharge.        Left eye: No discharge.  Cardiovascular:     Rate and Rhythm: Regular rhythm. Tachycardia present.  Pulmonary:     Effort: No respiratory distress.     Comments: O2 sat in 90s on room air  Abdominal:     General: Bowel sounds are normal.     Tenderness: There is no abdominal tenderness.  Neurological:     Mental Status: He is alert.  Psychiatric:        Mood and Affect: Mood normal.     Filed Weights   01/04/21 0405 01/05/21 0007 01/05/21 0500  Weight: 116.3 kg 114.5 kg 114.5 kg     Intake/Output Summary (Last 24 hours) at 01/05/2021 1253 Last data filed at 01/05/2021 1230 Gross per 24 hour  Intake 1632.34 ml  Output 3774 ml  Net -2141.66 ml   Net IO Since Admission: -3,691.66 mL [01/05/21 1253]  Pertinent Labs: CBC Latest Ref Rng & Units 01/05/2021 01/04/2021 01/04/2021  WBC 4.0 - 10.5 K/uL 10.7(H) - 12.1(H)  Hemoglobin 13.0 - 17.0 g/dL 14.7 16.0 14.6  Hematocrit 39.0 - 52.0 % 45.9 47.0 46.3  Platelets 150 - 400 K/uL 247 - 292    CMP Latest Ref Rng & Units 01/05/2021 01/04/2021 01/04/2021  Glucose 70 - 99 mg/dL  112(H) - 143(H)  BUN 6 - 20 mg/dL 16 - 12  Creatinine 0.61 - 1.24 mg/dL 1.15 - 1.22  Sodium 135 - 145 mmol/L 141 142 138  Potassium 3.5 - 5.1 mmol/L 4.0 4.5 4.5  Chloride 98 - 111 mmol/L 103 - 107  CO2 22 - 32 mmol/L 26 - 20(L)  Calcium 8.9 - 10.3 mg/dL 9.3 - 9.1  Total Protein 6.5 - 8.1 g/dL - - 6.8  Total Bilirubin 0.3 - 1.2 mg/dL - - 0.5  Alkaline Phos 38 - 126 U/L - - 101  AST 15 - 41 U/L - - 25  ALT 0 - 44 U/L - - 28    Imaging: DG Chest 2 View  Result Date: 01/05/2021 CLINICAL DATA:  Dyspnea. EXAM: CHEST - 2 VIEW COMPARISON:  01/04/2021 FINDINGS: 0952 hours. The diffuse bilateral airspace disease seen previously has decreased in the interval with persistent disease most prominent at the left base today. Small bilateral effusions. The cardio pericardial silhouette is enlarged. The visualized bony structures of the thorax show no acute abnormality. Telemetry leads overlie the chest.  IMPRESSION: Interval improvement in bilateral airspace disease with persistent small effusions. Electronically Signed   By: Misty Stanley M.D.   On: 01/05/2021 10:23   US Abdomen Limited  Result Date: 01/04/2021 CLINICAL DATA:  Abdominal distension. EXAM: ULTRASOUND ABDOMEN LIMITED RIGHT UPPER QUADRANT COMPARISON:  None. FINDINGS: Gallbladder: Surgically absent. Common bile duct: Diameter: 3 mm, within normal limits. Liver: No focal lesion identified. Within normal limits in parenchymal echogenicity. Portal vein is patent on color Doppler imaging with normal direction of blood flow towards the liver. Other: Bilateral pleural effusions noted. IMPRESSION: Prior cholecystectomy.  No evidence of biliary ductal dilatation. Bilateral pleural effusions noted. Electronically Signed   By: Marlaine Hind M.D.   On: 01/04/2021 19:16    Assessment/Plan:   Principal Problem:   Acute on chronic systolic heart failure (HCC) Active Problems:   Hypertension   BPH (benign prostatic hyperplasia)   DM2 (diabetes mellitus,  type 2) ([place])   Lymphoma ([place])   Schizophrenia (HCC)   Diarrhea  Charles [name] is a 55 year old gentleman with past medical history of HFrEF, type 2 diabetes, hypertension, hyperlipidemia, paranoid schizophrenia, newly diagnosed Hodgkin's lymphoma, who presented to the ED for dypsnea from an acute on chronic heart failure exacerbation.   Acute heart failure exacerbation His tachycardia and tachypnea improved today. Patient was placed on Bipap overnight and his ABG improved.  He also had 4800 cc of urine output yesterday with IV Lasix. [name] very closed to his dry wt based on normal IVC on Echo and physical exam. Continue IV lasix per Cardiology.  -Appreciate cardiology recommendations -Continue Entresto, spironolactone, digoxin, and Farxiga -Resume Metoprolol  -Consider adding ivabradine if remains tachycardic -Continue Lasix 120 mg twice daily -Strict I/O -Daily weight -Patient will need assessment for an AICD because his EF did not improved after 3 months.    Diarrhea-resolved Abdominal distention Abdominal US was negative for ascites. Liver appears normal with hx of cholecystectomy. Abdomen appears softer today.  -No further work up    Hodgkin lymphoma His mediastinal biopsy came back consistent with Hodgkin lymphoma.  Has not started treatment yet.  He has a follow-up appointment with Rad Onc on 01/07/2021. -Follow-up with oncology     Hyperlipidemia Continue Lipitor 10 mg daily   Type 2 diabetes Last A1c 8.1 in December.  Home regimen include Farxiga and Levemir 25 units daily. -Continue Farxiga -Sliding scale insulin -Monitor CBG   Hypothyroidism TSH and T4 within goal.   -Continue levothyroxine   Schizophrenia -Continue risperidone   Code: Full Diet: Heart healthy/carb modified IVF: None DVT: Lovenox  Dispo: continue medical management   Gaylan Gerold, DO 01/05/2021, 12:53 PM Pager: [phone]  Please contact the on call pager after 5 pm  and on weekends at [phone].

## 2021-01-05 NOTE — Progress Notes (Signed)
Progress Note  Patient Name: Charles [name] Date of Encounter: 01/05/2021  [facility] Cardiologist: [name], MD   Subjective   He is breathing better.  He wonders why he developed shortness of breath.  He is not having chest pain.  He advocates that he is 100% compliant with medical therapy.    After discharge in mid February, diuretics were held because of low blood pressure.  Seen back in heart failure clinic 6 days prior to admission and spironolactone and furosemide were resumed.  Despite resuming therapy, dyspnea progressed.  Inpatient Medications    Scheduled Meds:  atorvastatin  10 mg Oral q1800   brimonidine  1 drop Both Eyes BID   cyclobenzaprine  10 mg Oral TID   dapagliflozin propanediol  10 mg Oral Daily   digoxin  125 mcg Oral Daily   enoxaparin (LOVENOX) injection  55 mg Subcutaneous Q24H   insulin aspart  0-15 Units Subcutaneous TID WC   latanoprost  1 drop Both Eyes QHS   levothyroxine  100 mcg Oral QAC breakfast   melatonin  3 mg Oral QHS   pneumococcal 23 valent vaccine  0.5 mL Intramuscular Tomorrow-1000   risperiDONE  0.5 mg Oral QHS   sacubitril-valsartan  1 tablet Oral BID   spironolactone  12.5 mg Oral Daily   tamsulosin  0.4 mg Oral Daily   Continuous Infusions:  furosemide 120 mg (01/04/21 1626)   PRN Meds: acetaminophen **OR** acetaminophen, ondansetron **OR** ondansetron (ZOFRAN) IV   Vital Signs    Vitals:   01/05/21 0413 01/05/21 0500 01/05/21 0759 01/05/21 0826  BP:   119/81 104/64  Pulse:   97 (!) 103  Resp: (!) 22  20 20   Temp:    97.9 F (36.6 C)  TempSrc:    Oral  SpO2: 100%  99% 100%  Weight:  114.5 kg    Height:        Intake/Output Summary (Last 24 hours) at 01/05/2021 1023 Last data filed at 01/05/2021 0340 Gross per 24 hour  Intake 1270.34 ml  Output 4800 ml  Net -3529.66 ml   Last 3 Weights 01/05/2021 01/05/2021 01/04/2021  Weight (lbs) 252 lb 6.8 oz 252 lb 6.8 oz 256 lb 6.3 oz  Weight  (kg) 114.5 kg 114.5 kg 116.3 kg      Telemetry    Normal sinus rhythm- Personally Reviewed  ECG    Sinus tachycardia with rightward axis.  Baseline artifact.- Personally Reviewed  Physical Exam  Sitting in chair. GEN: No acute distress.   Neck: No JVD Cardiac: RRR, no murmurs, rubs, or gallops.  Respiratory: Clear to auscultation bilaterally. GI: Soft, nontender, non-distended  MS: No edema; No deformity. Neuro:  Nonfocal  Psych: Normal affect   Labs    High Sensitivity Troponin:   Recent Labs  Lab 12/20/20 0912 12/20/20 1304 01/04/21 0848 01/04/21 1025  TROPONINIHS 29* 34* 48* 52*      Chemistry Recent Labs  Lab 01/01/21 1308 01/04/21 0507 01/04/21 0904 01/05/21 0218  NA 140 138 142 141  K 4.5 4.5 4.5 4.0  CL 107 107  --  103  CO2 24 20*  --  26  GLUCOSE 106* 143*  --  112*  BUN 12 12  --  16  CREATININE 1.22 1.22  --  1.15  CALCIUM 9.3 9.1  --  9.3  PROT 7.3 6.8  --   --   ALBUMIN 3.4* 3.4*  --   --   AST 12* 25  --   --  ALT 23 28  --   --   ALKPHOS 132* 101  --   --   BILITOT 0.3 0.5  --   --   GFRNONAA >60 >60  --  >60  ANIONGAP 9 11  --  12     Hematology Recent Labs  Lab 01/01/21 1308 01/04/21 0507 01/04/21 0904 01/05/21 0218  WBC 10.8* 12.1*  --  10.7*  RBC 5.86* 5.85*  --  5.85*  HGB 14.6 14.6 16.0 14.7  HCT 45.2 46.3 47.0 45.9  MCV 77.1* 79.1*  --  78.5*  MCH 24.9* 25.0*  --  25.1*  MCHC 32.3 31.5  --  32.0  RDW 17.3* 17.9*  --  18.2*  PLT 247 292  --  247    BNP Recent Labs  Lab 12/29/20 1524 01/04/21 0507  BNP 559.7* 592.9*     DDimer No results for input(s): DDIMER in the last 168 hours.   Radiology    CT Angio Chest PE W and/or Wo Contrast  Result Date: 01/04/2021 CLINICAL DATA:  55 year old male with history of shortness of breath when lying down. EXAM: CT ANGIOGRAPHY CHEST WITH CONTRAST TECHNIQUE: Multidetector CT imaging of the chest was performed using the standard protocol during bolus administration of  intravenous contrast. Multiplanar CT image reconstructions and MIPs were obtained to evaluate the vascular anatomy. CONTRAST:  16mL OMNIPAQUE IOHEXOL 300 MG/ML  SOLN COMPARISON:  Chest CTA 11/29/2020. FINDINGS: Cardiovascular: Study is limited by considerable patient respiratory motion. With these limitations in mind, there is no central, lobar or segmental sized filling defects to suggest clinically significant pulmonary embolism. Smaller subsegmental sized pulmonary emboli cannot be entirely excluded on the basis of today's examination. Heart size is enlarged with left ventricular dilatation. There is no significant pericardial fluid, thickening or pericardial calcification. Aortic atherosclerosis. No definite coronary artery calcifications. Mediastinum/Nodes: Enlarged anterior mediastinal lymph nodes measuring up to 2.4 cm in short axis (axial image 48 of series 5). Multiple other prominent borderline enlarged mediastinal and hilar lymph nodes are also noted. Esophagus is unremarkable in appearance. No axillary lymphadenopathy. Lungs/Pleura: Widespread areas of ground-glass attenuation and interlobular septal thickening noted throughout the lungs bilaterally, the appearance of which is concerning for a background of pulmonary edema. In addition, in the periphery of the right upper lobe (axial image 43 of series 7) there is a more dense nodular area of apparent airspace consolidation measuring 2.3 x 1.6 cm. Small to moderate bilateral pleural effusions lying dependently. Upper Abdomen: Unremarkable. Musculoskeletal: Status post laminectomy at T5-T6. There are no aggressive appearing lytic or blastic lesions noted in the visualized portions of the skeleton. Review of the MIP images confirms the above findings. IMPRESSION: 1. Despite the limitations of today's examination, there is no evidence to suggest clinically significant central, lobar or segmental sized pulmonary embolism. 2. Cardiomegaly with left  ventricular dilatation, evidence of pulmonary edema and small to moderate bilateral pleural effusions; imaging findings concerning for congestive heart failure. 3. More nodular focus of probable airspace consolidation in the periphery of the right upper lobe measuring 2.3 x 1.6 cm. This is favored to be of infectious or inflammatory etiology. However, attention on short-term follow-up noncontrast chest CT is recommended in 3 months to ensure complete resolution of this finding, as the possibility of neoplasm is not entirely excluded. At the time of the follow-up imaging, attention should also be directed to the mediastinal lymphadenopathy noted on today's examination. This lymphadenopathy is nonspecific, and can be seen in the setting of  congestive heart failure, however, the possibility of lymphoproliferative disorder or other neoplasm warrants of exclusion. 4. Aortic atherosclerosis. Aortic Atherosclerosis (ICD10-I70.0). Electronically Signed   By: Vinnie Langton M.D.   On: 01/04/2021 09:52   US Abdomen Limited  Result Date: 01/04/2021 CLINICAL DATA:  Abdominal distension. EXAM: ULTRASOUND ABDOMEN LIMITED RIGHT UPPER QUADRANT COMPARISON:  None. FINDINGS: Gallbladder: Surgically absent. Common bile duct: Diameter: 3 mm, within normal limits. Liver: No focal lesion identified. Within normal limits in parenchymal echogenicity. Portal vein is patent on color Doppler imaging with normal direction of blood flow towards the liver. Other: Bilateral pleural effusions noted. IMPRESSION: Prior cholecystectomy.  No evidence of biliary ductal dilatation. Bilateral pleural effusions noted. Electronically Signed   By: Marlaine Hind M.D.   On: 01/04/2021 19:16   DG Chest Portable 1 View  Result Date: 01/04/2021 CLINICAL DATA:  55 year old male with shortness of breath. Abdominal pain. Status post left video-assisted thoracoscopy for biopsy of mediastinal lymph nodes earlier this month. EXAM: PORTABLE CHEST 1 VIEW  COMPARISON:  Chest radiographs 12/21/2020 and earlier. FINDINGS: Portable AP upright view at 0431 hours. Right IJ central line earlier this month has been removed. Stable mediastinal contours. Mild cardiomegaly. Streaky asymmetric pulmonary opacity has increased in both lungs but is greater on the right. No pneumothorax. No pleural effusion is evident. No air bronchograms identified. No acute osseous abnormality identified. Paucity of bowel gas in the upper abdomen. IMPRESSION: 1. New streaky and asymmetric widespread pulmonary opacity, greater in the right lung. This is nonspecific with differential considerations including bilateral pneumonia, aspiration, asymmetric pulmonary edema. 2. Stable cardiomegaly. Electronically Signed   By: Genevie Ann M.D.   On: 01/04/2021 04:50   ECHOCARDIOGRAM COMPLETE  Result Date: 01/04/2021    ECHOCARDIOGRAM REPORT   Patient Name:   Charles [name] Date of Exam: 01/04/2021 Medical Rec #:  [id number]   Height:       69.0 in Accession #:    [id number]  Weight:       256.4 lb Date of Birth:  08/21/1966    BSA:          2.296 m Patient Age:    55 years    BP:           104/51 mmHg Patient Gender: M           HR:           117 bpm. Exam Location:  Inpatient Procedure: 2D Echo, Cardiac Doppler, Color Doppler and Intracardiac            Opacification Agent STAT ECHO Indications:    Dyspnea R06.00  History:        Patient has prior history of Echocardiogram examinations, most                 recent 10/11/2020. Cardiomyopathy and CHF; Risk                 Factors:Hypertension, Diabetes and Current Smoker.  Sonographer:    Vickie Epley RDCS Referring Phys: [id number] Oda Kilts  Sonographer Comments: Technically difficult study due to poor echo windows. IMPRESSIONS  1. Technically difficult; definity used; right heart not well visualized.  2. Left ventricular ejection fraction, by estimation, is 20 to 25%. The left ventricle has severely decreased function. The left ventricle demonstrates global  hypokinesis. The left ventricular internal cavity size was severely dilated. Left ventricular diastolic parameters are consistent with Grade III diastolic dysfunction (restrictive).  3. Right ventricular systolic function is  normal. The right ventricular size is normal.  4. The mitral valve is normal in structure. Mild mitral valve regurgitation. No evidence of mitral stenosis.  5. The aortic valve is tricuspid. Aortic valve regurgitation is trivial. No aortic stenosis is present.  6. The inferior vena cava is normal in size with greater than 50% respiratory variability, suggesting right atrial pressure of 3 mmHg. FINDINGS  Left Ventricle: Left ventricular ejection fraction, by estimation, is 20 to 25%. The left ventricle has severely decreased function. The left ventricle demonstrates global hypokinesis. Definity contrast agent was given IV to delineate the left ventricular endocardial borders. The left ventricular internal cavity size was severely dilated. There is no left ventricular hypertrophy. Left ventricular diastolic parameters are consistent with Grade III diastolic dysfunction (restrictive). Right Ventricle: The right ventricular size is normal. Right ventricular systolic function is normal. Left Atrium: Left atrial size was normal in size. Right Atrium: Right atrial size was normal in size. Pericardium: There is no evidence of pericardial effusion. Mitral Valve: The mitral valve is normal in structure. Mild mitral valve regurgitation. No evidence of mitral valve stenosis. Tricuspid Valve: The tricuspid valve is normal in structure. Tricuspid valve regurgitation is trivial. No evidence of tricuspid stenosis. Aortic Valve: The aortic valve is tricuspid. Aortic valve regurgitation is trivial. No aortic stenosis is present. Pulmonic Valve: The pulmonic valve was not well visualized. Pulmonic valve regurgitation is not visualized. No evidence of pulmonic stenosis. Aorta: The aortic root is normal in size and  structure. Venous: The inferior vena cava is normal in size with greater than 50% respiratory variability, suggesting right atrial pressure of 3 mmHg. IAS/Shunts: The interatrial septum was not well visualized. Additional Comments: Technically difficult; definity used; right heart not well visualized.  LEFT VENTRICLE PLAX 2D LVIDd:         7.40 cm LVIDs:         6.70 cm LV PW:         0.70 cm LV IVS:        0.70 cm LVOT diam:     2.20 cm LV SV:         21 LV SV Index:   9 LVOT Area:     3.80 cm  RIGHT VENTRICLE TAPSE (M-mode): 1.2 cm LEFT ATRIUM           Index       RIGHT ATRIUM           Index LA diam:      2.80 cm 1.22 cm/m  RA Area:     15.40 cm LA Vol (A2C): 42.4 ml 18.47 ml/m RA Volume:   36.30 ml  15.81 ml/m LA Vol (A4C): 49.3 ml 21.47 ml/m  AORTIC VALVE LVOT Vmax:   46.00 cm/s LVOT Vmean:  34.300 cm/s LVOT VTI:    0.056 m  AORTA Ao Root diam: 3.50 cm MR Peak grad: 49.8 mmHg MR Mean grad: 31.0 mmHg   SHUNTS MR Vmax:      353.00 cm/s Systemic VTI:  0.06 m MR Vmean:     263.0 cm/s  Systemic Diam: 2.20 cm Kirk Ruths MD Electronically signed by Kirk Ruths MD Signature Date/Time: 01/04/2021/11:55:33 AM    Final     Cardiac Studies   No new cardiac studies.  Echo result is noted above.  Patient Profile     55 y.o. 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  Assessment & Plan    1. Acute on chronic systolic heart failure: Plan is to continue IV diuresis until euvolemic then reestablish strong four pillar heart failure regimen plus or minus ivabradine.  Continue IV diuresis today and consider switching to oral therapy within the  next 24 hours depending upon clinical volume status and kidney function.  Daily basic metabolic panel is needed.  Ultimately, beta-blocker/SGLT2/Entresto/MRA should be backbone therapy.  Consider ivabradine and titrate diuretic therapy to maintain euvolemia. 2. Pneumonia, with sepsis: Currently on broad-spectrum antibiotic therapy 3. Lymphoma, Hodgkin's: Not yet treated       For questions or updates, please contact [place] Please consult [email or web address] for contact info under        Signed, Sinclair Grooms, MD  01/05/2021, 10:23 AM

## 2021-01-05 NOTE — Evaluation (Signed)
Physical Therapy Evaluation Patient Details Name: Charles [name] MRN: [id number] DOB: 1966-07-13 Today's Date: 01/05/2021   History of Present Illness  Charles [name] is a 55 year old gentleman with past medical history of HFrEF, type 2 diabetes, hypertension, hyperlipidemia, paranoid schizophrenia, newly diagnosed Hodgkin's lymphoma, who presented to the ED for worsening shortness of breath. Admitted with acute herat failure exacerbation.  Clinical Impression  Prior to admission, pt lives in an assisted living facility, uses a Rollator for mobility and is independent with ADL's. Pt presents with mildly decreased cardiopulmonary endurance. Ambulating x 200 feet with a walker at a supervision level, SpO2 96-97% on 2L O2. Will continue to follow acutely to promote mobility.     Follow Up Recommendations No PT follow up;Supervision for mobility/OOB    Equipment Recommendations  None recommended by PT    Recommendations for Other Services       Precautions / Restrictions Precautions Precautions: Fall Restrictions Weight Bearing Restrictions: No      Mobility  Bed Mobility Overal bed mobility: Independent                  Transfers Overall transfer level: Needs assistance Equipment used: Rolling walker (2 wheeled) Transfers: Sit to/from Stand Sit to Stand: Supervision            Ambulation/Gait Ambulation/Gait assistance: Supervision Gait Distance (Feet): 200 Feet Assistive device: Rolling walker (2 wheeled) Gait Pattern/deviations: Step-through pattern;Decreased stride length Gait velocity: decreased   General Gait Details: Slow and steady pace, no gross imbalance noted.  Stairs            Wheelchair Mobility    Modified Rankin (Stroke Patients Only)       Balance Overall balance assessment: Mild deficits observed, not formally tested                                           Pertinent Vitals/Pain Pain Assessment: No/denies pain     Home Living Family/patient expects to be discharged to:: Assisted living Living Arrangements: Alone             Home Equipment: Walker - 4 wheels      Prior Function Level of Independence: Needs assistance   Gait / Transfers Assistance Needed: using Rollator  ADL's / Homemaking Assistance Needed: walks to dining hall for meals, staff assist for medications, independent ADL's        Hand Dominance        Extremity/Trunk Assessment   Upper Extremity Assessment Upper Extremity Assessment: Defer to OT evaluation    Lower Extremity Assessment Lower Extremity Assessment: Overall WFL for tasks assessed    Cervical / Trunk Assessment Cervical / Trunk Assessment: Normal  Communication   Communication: No difficulties  Cognition Arousal/Alertness: Awake/alert Behavior During Therapy: WFL for tasks assessed/performed Overall Cognitive Status: Within Functional Limits for tasks assessed                                        General Comments      Exercises     Assessment/Plan    PT Assessment Patient needs continued PT services  PT Problem List Decreased activity tolerance;Decreased balance;Decreased mobility       PT Treatment Interventions DME instruction;Gait training;Therapeutic activities;Functional mobility training;Therapeutic exercise;Balance training;Patient/family education    PT Goals (  Current goals can be found in the Care Plan section)  Acute Rehab PT Goals Patient Stated Goal: "tell the doctor I walked well." PT Goal Formulation: With patient Time For Goal Achievement: 01/19/21 Potential to Achieve Goals: Good    Frequency Min 3X/week   Barriers to discharge        Co-evaluation               AM-PAC PT "6 Clicks" Mobility  Outcome Measure Help needed turning from your back to your side while in a flat bed without using bedrails?: None Help needed moving from lying on your back to sitting on the side of a flat  bed without using bedrails?: None Help needed moving to and from a bed to a chair (including a wheelchair)?: None Help needed standing up from a chair using your arms (e.g., wheelchair or bedside chair)?: None Help needed to walk in hospital room?: None Help needed climbing 3-5 steps with a railing? : A Little 6 Click Score: 23    End of Session Equipment Utilized During Treatment: Oxygen Activity Tolerance: Patient tolerated treatment well Patient left: in chair;with call bell/phone within reach Nurse Communication: Mobility status PT Visit Diagnosis: Difficulty in walking, not elsewhere classified (R26.2)    Time: 1657-9038 PT Time Calculation (min) (ACUTE ONLY): 20 min   Charges:   PT Evaluation $PT Eval Moderate Complexity: 1 Mod          Wyona Almas, PT, DPT Acute Rehabilitation Services Pager [phone] Office [phone]   Deno Etienne 01/05/2021, 9:10 AM

## 2021-01-05 NOTE — Progress Notes (Signed)
Assessed patient his breathing is not labored.  Currently wearing 3 lpm and states he is feeling fine.  We discussed BIPAP for rest tonight.  Patient said he don't think he will need it.

## 2021-01-06 ENCOUNTER — Ambulatory Visit: Payer: Self-pay | Admitting: Thoracic Surgery (Cardiothoracic Vascular Surgery)

## 2021-01-06 DIAGNOSIS — I5023 Acute on chronic systolic (congestive) heart failure: Secondary | ICD-10-CM | POA: Diagnosis not present

## 2021-01-06 DIAGNOSIS — N401 Enlarged prostate with lower urinary tract symptoms: Secondary | ICD-10-CM

## 2021-01-06 DIAGNOSIS — E119 Type 2 diabetes mellitus without complications: Secondary | ICD-10-CM

## 2021-01-06 DIAGNOSIS — C8590 Non-Hodgkin lymphoma, unspecified, unspecified site: Secondary | ICD-10-CM

## 2021-01-06 DIAGNOSIS — I10 Essential (primary) hypertension: Secondary | ICD-10-CM | POA: Diagnosis not present

## 2021-01-06 DIAGNOSIS — R197 Diarrhea, unspecified: Secondary | ICD-10-CM

## 2021-01-06 DIAGNOSIS — F209 Schizophrenia, unspecified: Secondary | ICD-10-CM

## 2021-01-06 LAB — CBC
HCT: 46 % (ref 39.0–52.0)
Hemoglobin: 15.6 g/dL (ref 13.0–17.0)
MCH: 25.8 pg — ABNORMAL LOW (ref 26.0–34.0)
MCHC: 33.9 g/dL (ref 30.0–36.0)
MCV: 76 fL — ABNORMAL LOW (ref 80.0–100.0)
Platelets: 238 10*3/uL (ref 150–400)
RBC: 6.05 MIL/uL — ABNORMAL HIGH (ref 4.22–5.81)
RDW: 18 % — ABNORMAL HIGH (ref 11.5–15.5)
WBC: 9.7 10*3/uL (ref 4.0–10.5)
nRBC: 0 % (ref 0.0–0.2)

## 2021-01-06 LAB — BASIC METABOLIC PANEL
Anion gap: 16 — ABNORMAL HIGH (ref 5–15)
BUN: 20 mg/dL (ref 6–20)
CO2: 25 mmol/L (ref 22–32)
Calcium: 9.1 mg/dL (ref 8.9–10.3)
Chloride: 96 mmol/L — ABNORMAL LOW (ref 98–111)
Creatinine, Ser: 1.3 mg/dL — ABNORMAL HIGH (ref 0.61–1.24)
GFR, Estimated: >60 mL/min (ref >60)
Glucose, Bld: 112 mg/dL — ABNORMAL HIGH (ref 70–99)
Potassium: 3.7 mmol/L (ref 3.5–5.1)
Sodium: 137 mmol/L (ref 135–145)

## 2021-01-06 LAB — GLUCOSE, CAPILLARY
Glucose-Capillary: 119 mg/dL — ABNORMAL HIGH (ref 70–99)
Glucose-Capillary: 114 mg/dL — ABNORMAL HIGH (ref 70–99)
Glucose-Capillary: 127 mg/dL — ABNORMAL HIGH (ref 70–99)
Glucose-Capillary: 137 mg/dL — ABNORMAL HIGH (ref 70–99)

## 2021-01-06 MED ORDER — FUROSEMIDE 80 MG PO TABS: 80.0000 mg | ORAL_TABLET | Freq: Every day | ORAL | 0 refills | Status: DC

## 2021-01-06 MED ORDER — FUROSEMIDE 40 MG PO TABS: 40.0000 mg | ORAL_TABLET | Freq: Every day | ORAL | Status: DC

## 2021-01-06 MED ORDER — METOPROLOL SUCCINATE ER 25 MG PO TB24: 75.0000 mg | ORAL_TABLET | Freq: Every day | ORAL | 0 refills | Status: DC

## 2021-01-06 MED ORDER — SPIRONOLACTONE 25 MG PO TABS: 12.5000 mg | ORAL_TABLET | Freq: Every day | ORAL | 0 refills | Status: AC

## 2021-01-06 MED ORDER — FUROSEMIDE 80 MG PO TABS
80.0000 mg | ORAL_TABLET | Freq: Every day | ORAL | Status: DC
  Administered 2021-01-06: 80 mg via ORAL
  Filled 2021-01-06: qty 1

## 2021-01-06 NOTE — Progress Notes (Signed)
Subjective:  Overnight, patient declined BiPAP.  This morning, patient reports that he is not currently experiencing shortness of breath but endorses some shortness of breath last night which was relieved with oxygen via nasal cannula. Patient reports that prior to admission he has lived in assisted living facility for about 3 months.  Objective:  Vital signs in last 24 hours: Vitals:   01/06/21 0344 01/06/21 0750 01/06/21 0905 01/06/21 1223  BP: 96/67 97/78 (!) 124/95 93/70  Pulse: 86 (!) 110 (!) 107 (!) 104  Resp: 18 20 16 20   Temp: 97.6 F (36.4 C) 98.3 F (36.8 C) 98.3 F (36.8 C) (!) 97.5 F (36.4 C)  TempSrc: Oral Oral  Oral  SpO2: 98% 93% 97% 98%  Weight: 113.3 kg     Height:      On room air during our evaluation, however intermittently wearing 2L oxygen via nasal cannula  Intake/Output Summary (Last 24 hours) at 01/06/2021 1328 Last data filed at 01/06/2021 1310 Gross per 24 hour  Intake 1669 ml  Output 3625 ml  Net -1956 ml  Cumulative net: -6,347.75mL  Filed Weights   01/05/21 0007 01/05/21 0500 01/06/21 0344  Weight: 114.5 kg 114.5 kg 113.3 kg  Weight on admission: 116.3kg  Physical Exam Vitals reviewed.  Constitutional:      General: He is not in acute distress. Cardiovascular:     Rate and Rhythm: Normal rate and regular rhythm.     Heart sounds: Normal heart sounds.  Pulmonary:     Effort: Pulmonary effort is normal. No respiratory distress.     Breath sounds: Normal breath sounds. No wheezing, rhonchi or rales.  Abdominal:     General: Abdomen is flat. Bowel sounds are normal.     Palpations: Abdomen is soft.     Tenderness: There is no abdominal tenderness.  Musculoskeletal:     Right lower leg: No edema.     Left lower leg: No edema.    Labs in last 24 hours: CBC Latest Ref Rng & Units 01/06/2021 01/05/2021 01/04/2021  WBC 4.0 - 10.5 K/uL 9.7 10.7(H) -  Hemoglobin 13.0 - 17.0 g/dL 15.6 14.7 16.0  Hematocrit 39.0 - 52.0 % 46.0 45.9 47.0   Platelets 150 - 400 K/uL 238 247 -   BMP Latest Ref Rng & Units 01/06/2021 01/05/2021 01/04/2021  Glucose 70 - 99 mg/dL 112(H) 112(H) -  BUN 6 - 20 mg/dL 20 16 -  Creatinine 0.61 - 1.24 mg/dL 1.30(H) 1.15 -  Sodium 135 - 145 mmol/L 137 141 142  Potassium 3.5 - 5.1 mmol/L 3.7 4.0 4.5  Chloride 98 - 111 mmol/L 96(L) 103 -  CO2 22 - 32 mmol/L 25 26 -  Calcium 8.9 - 10.3 mg/dL 9.1 9.3 -   Glucose: 114, 119, 136, 118  Imaging in last 24 hours: No results found.   Assessment/Plan:  Principal Problem:   Acute on chronic systolic heart failure (HCC) Active Problems:   Hypertension   BPH (benign prostatic hyperplasia)   DM2 (diabetes mellitus, type 2) ([place])   Lymphoma (HCC)   Schizophrenia (HCC)   Diarrhea  Charles [name] is a 55 year old male with past medical history significant for nonischemic cardiomyopathy producing HFrEF (20-25%), T2DM, HTN, HLD, paranoid schizophrenia, and recent diagnosis of Hodgkin's lymphoma who presented to [facility] on 01/04/2021 for evaluation of dyspnea found to have acute on chronic HFrEF.  #Acute on chronic systolic heart failure Patient has been receiving IV furosemide 120mg  twice daily over past two days  with appropriate urine output of 6.3L net negative since admission and weight down 3kg. Patient appears euvolemic on examination and would benefit from de-escalation of his current diuretic regimen. -Cardiology following, appreciate recommendations -Discontinue IV lasix -Start oral furosemide 80mg  daily (home dose was 40mg  daily) -Continue Entresto 24-26mg  twice daily -Continue spironolactone 12.5mg  daily -Continue metoprolol 75mg  daily -Continue digoxin 146mcg daily -Continue dapagliflozin 10mg  daily -Strict I/O -Daily weights  #Hodgkin's lymphoma, chronic Patient missed CT surgery appointment scheduled for today due to his hospitalization. He is scheduled for a new patient appointment with Radiation Oncology tomorrow. -Follow-up with Rad Onc on  01/07/2021 -Reschedule CT surgery appointment (missed on 01/06/2021)  #T2DM, chronic Last A1c 8.1 in December. -Continuedapagliflozin 10mg  daily -Continue moderate SSI (0-15 units) TID -Monitor CBG  #Hypothyroidism, chronic -Continue levothyroxine 16mcg daily  #Hyperlipidemia, chronic -Continue Lipitor 10 mg daily  #Paranoid schizophrenia, chronic -Continue home risperidone  #Code status: Full code #Diet:Heart healthy/carb modified #IVF: None #VTE ppx: Enoxaparin 55mg  daily  Cato Mulligan, MD 01/06/2021, 1:28 PM Pager: [phone] After 5pm on weekdays and 1pm on weekends: On Call pager [phone]

## 2021-01-06 NOTE — NC FL2 (Addendum)
[place] LEVEL OF CARE SCREENING TOOL     IDENTIFICATION  Patient Name: Charles [name] Birthdate: 1966-06-22 Sex: male Admission Date (Current Location): 01/04/2021  [place] and [place] Number:  Kathleen Argue [id number] [place] and Address:  [facility] [place], [place] [address] [place], [place] [address]      Provider Number: [id number]  Attending Physician Name and Address:  Axel Filler, *  Relative Name and Phone Number:  Marzetta Merino (friend) [phone] ( mobile)    Current Level of Care: Hospital Recommended Level of Care: [place] Prior Approval Number:    Date Approved/Denied:   PASRR Number:    Discharge Plan:  (ALF)    Current Diagnoses: Patient Active Problem List   Diagnosis Date Noted   Schizophrenia ([place]) 01/04/2021   Diarrhea 01/04/2021   Pulmonary edema    Mediastinal adenopathy 12/18/2020   Adjustment disorder with mixed anxiety and depressed mood 10/30/2020   Acute on chronic systolic heart failure ([place])    Mediastinal lymphadenopathy 10/11/2020   Shortness of breath 10/11/2020   Sinus tachycardia 10/11/2020   Night sweats 10/11/2020   DM2 (diabetes mellitus, type 2) ([place]) 10/11/2020   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   Hypertension 09/25/2020   BPH (benign prostatic hyperplasia) 09/25/2020   Insomnia 09/25/2020    Orientation RESPIRATION BLADDER Height & Weight     Self,Time  Normal Continent Weight: 113.3 kg Height:  _0  (175.3 cm)  BEHAVIORAL SYMPTOMS/MOOD NEUROLOGICAL BOWEL NUTRITION STATUS      Continent Diet (normal , no salt added)  AMBULATORY STATUS COMMUNICATION OF NEEDS Skin   Supervision Verbally Surgical wounds (chest incision)                       Personal Care Assistance Level of Assistance  Bathing,Feeding,Dressing Bathing Assistance: Limited assistance Feeding assistance: Independent Dressing Assistance: Independent     Functional Limitations Info   Sight,Hearing,Speech Sight Info: Adequate Hearing Info: Adequate Speech Info: Adequate    SPECIAL CARE FACTORS FREQUENCY   Respiratory    2 liters per [place]                Contractures Contractures Info: Not present    Additional Factors Info  Code Status,Allergies Code Status Info: FULL Allergies Info: ibuprofen,penecillins   Insulin Sliding Scale Info: insulin aspart (novolog) injection 0-15 units          Discharge Medications:  Discharge Medications:      Allergies as of 01/06/2021      Reactions   Ibuprofen    Unknown per MAR   Penicillins Rash               Medication List        STOP taking these medications       cyclobenzaprine 10 MG tablet Commonly known as: FLEXERIL   divalproex 500 MG DR tablet Commonly known as: DEPAKOTE   insulin detemir 100 UNIT/ML FlexPen Commonly known as: LEVEMIR   INVEGA SUSTENNA IM   traMADol 50 MG tablet Commonly known as: ULTRAM             TAKE these medications       acetaminophen 325 MG tablet Commonly known as: TYLENOL Take 650 mg by mouth every 6 (six) hours as needed for moderate pain.   atorvastatin 10 MG tablet Commonly known as: LIPITOR Take 10 mg by mouth daily.   blood glucose meter kit and supplies Kit Dispense based on patient and  insurance preference. Use up to four times daily as directed. (FOR ICD-9 250.00, 250.01).   brimonidine 0.2 % ophthalmic solution Commonly known as: ALPHAGAN Place 1 drop into both eyes 2 (two) times daily.   dapagliflozin propanediol 10 MG Tabs tablet Commonly known as: FARXIGA Take 1 tablet (10 mg total) by mouth daily.   digoxin 0.125 MG tablet Commonly known as: Lanoxin Take 1 tablet (125 mcg total) by mouth daily.   furosemide 80 MG tablet Commonly known as: LASIX Take 1 tablet (80 mg total) by mouth daily. Start taking on: January 07, 2021 What changed:   medication strength  how much to take   latanoprost 0.005 %  ophthalmic solution Commonly known as: XALATAN Place 1 drop into both eyes at bedtime.   levothyroxine 100 MCG tablet Commonly known as: SYNTHROID Take 100 mcg by mouth daily before breakfast.   melatonin 3 MG Tabs tablet Take 3 mg by mouth at bedtime.   metoprolol succinate 25 MG 24 hr tablet Commonly known as: Toprol XL Take 3 tablets (75 mg total) by mouth daily. What changed:   medication strength  how much to take  additional instructions  Another medication with the same name was removed. Continue taking this medication, and follow the directions you see here.   PEN NEEDLES 29GX1/2" 29G X 12MM Misc 1 each by Does not apply route daily.   polyethylene glycol 17 g packet Commonly known as: MIRALAX / GLYCOLAX Take 17 g by mouth 2 (two) times daily.   risperiDONE 0.5 MG tablet Commonly known as: RISPERDAL Take 1 tablet (0.5 mg total) by mouth at bedtime.   sacubitril-valsartan 24-26 MG Commonly known as: ENTRESTO Take 1 tablet by mouth 2 (two) times daily.   senna 8.6 MG Tabs tablet Commonly known as: SENOKOT Take 2 tablets (17.2 mg total) by mouth 2 (two) times daily.   spironolactone 25 MG tablet Commonly known as: Aldactone Take 0.5 tablets (12.5 mg total) by mouth daily. Start taking on: January 07, 2021   tamsulosin 0.4 MG Caps capsule Commonly known as: FLOMAX Take 0.4 mg by mouth daily.      Relevant Imaging Results:  Relevant Lab Results:   Additional Information [phone]  [name], RN

## 2021-01-06 NOTE — Discharge Summary (Addendum)
Name: Charles [name] MRN: [id number] DOB: 1966/06/29 55 y.o. PCP: Patient, No Pcp Per  Date of Admission: 01/04/2021  3:54 AM Date of Discharge: 01/06/2021 Attending Physician: Axel Filler, *  Discharge Diagnosis: 1.  Acute on chronic systolic heart failure 2. Hypoxic respiratory failure   Discharge Medications: Allergies as of 01/06/2021       Reactions   Ibuprofen    Unknown per MAR   Penicillins Rash        Medication List     STOP taking these medications    cyclobenzaprine 10 MG tablet Commonly known as: FLEXERIL   divalproex 500 MG DR tablet Commonly known as: DEPAKOTE   insulin detemir 100 UNIT/ML FlexPen Commonly known as: LEVEMIR   INVEGA SUSTENNA IM   traMADol 50 MG tablet Commonly known as: ULTRAM       TAKE these medications    acetaminophen 325 MG tablet Commonly known as: TYLENOL Take 650 mg by mouth every 6 (six) hours as needed for moderate pain.   atorvastatin 10 MG tablet Commonly known as: LIPITOR Take 10 mg by mouth daily.   blood glucose meter kit and supplies Kit Dispense based on patient and insurance preference. Use up to four times daily as directed. (FOR ICD-9 250.00, 250.01).   brimonidine 0.2 % ophthalmic solution Commonly known as: ALPHAGAN Place 1 drop into both eyes 2 (two) times daily.   dapagliflozin propanediol 10 MG Tabs tablet Commonly known as: FARXIGA Take 1 tablet (10 mg total) by mouth daily.   digoxin 0.125 MG tablet Commonly known as: Lanoxin Take 1 tablet (125 mcg total) by mouth daily.   furosemide 80 MG tablet Commonly known as: LASIX Take 1 tablet (80 mg total) by mouth daily. Start taking on: January 07, 2021 What changed:  medication strength how much to take   latanoprost 0.005 % ophthalmic solution Commonly known as: XALATAN Place 1 drop into both eyes at bedtime.   levothyroxine 100 MCG tablet Commonly known as: SYNTHROID Take 100 mcg by mouth daily before breakfast.   melatonin  3 MG Tabs tablet Take 3 mg by mouth at bedtime.   metoprolol succinate 25 MG 24 hr tablet Commonly known as: Toprol XL Take 3 tablets (75 mg total) by mouth daily. What changed:  medication strength how much to take additional instructions Another medication with the same name was removed. Continue taking this medication, and follow the directions you see here.   PEN NEEDLES 29GX1/2" 29G X 12MM Misc 1 each by Does not apply route daily.   polyethylene glycol 17 g packet Commonly known as: MIRALAX / GLYCOLAX Take 17 g by mouth 2 (two) times daily.   risperiDONE 0.5 MG tablet Commonly known as: RISPERDAL Take 1 tablet (0.5 mg total) by mouth at bedtime.   sacubitril-valsartan 24-26 MG Commonly known as: ENTRESTO Take 1 tablet by mouth 2 (two) times daily.   senna 8.6 MG Tabs tablet Commonly known as: SENOKOT Take 2 tablets (17.2 mg total) by mouth 2 (two) times daily.   spironolactone 25 MG tablet Commonly known as: Aldactone Take 0.5 tablets (12.5 mg total) by mouth daily. Start taking on: January 07, 2021   tamsulosin 0.4 MG Caps capsule Commonly known as: FLOMAX Take 0.4 mg by mouth daily.               Durable Medical Equipment  (From admission, onward)           Start     Ordered  01/06/21 1436  For home use only DME oxygen  Once       Question Answer Comment  Length of Need 6 Months   Mode or (Route) Nasal cannula   Liters per Minute 2   Frequency Continuous (stationary and portable oxygen unit needed)   Oxygen conserving device Yes   Oxygen delivery system Gas      01/06/21 1435            Disposition and follow-up:   Charles [name] was discharged from [facility] in Stable condition.  At the hospital follow up visit please address:  1.   Acute on chronic systolic heart failure Patient's discharge weight 113.3 kg.   Goal directed therapy as follows :Currently on Entresto 50 mg twice daily, spironolactone 12.5 mg daily,  metoprolol 75 mg daily, dapagliflozin 10 mg daily and digoxin 125 MCG daily, Lasix 80 mg daily.   Type 2 diabetes mellitus - Patient was on Lantus, stopped at discharged. He was started on Dapaglifozin and did not require any sliding scale insulin during hospital stay. See if patient is checking CBG's.  Hodgkin's lymphoma, chronic Please follow up with patient and check on status of these appointments. Patient missed CT surgery appointment scheduled for today due to his hospitalization. He is scheduled for a new patient appointment with Radiation Oncology tomorrow. -Follow-up with Rad Onc on 01/07/2021 -Reschedule CT surgery appointment (missed on 01/06/2021)  Hypoxic respiratory failure 2/2 to CHF - Sent out on 2 L supplemental oxygen , assess need for continued home supplemental oxygen.   2.  Labs / imaging needed at time of follow-up:BMP  3.  Pending labs/ test needing follow-up:   Follow-up Appointments:  Follow-up Information     [name], MD Follow up in 1 week(s).   Specialty: Cardiology Why: Call and schedule appointment to be seen in 1-2 weeks by your cardiologist.  Contact information: [address] [place] [address] [phone]                 Message sent to [facility] to contact patient to schedule a hospital follow-up and to establish with PCP.  Hospital Course by problem list:  #Acute on chronic systolic heart failure Patient has been receiving IV furosemide 132m twice daily over past two days with appropriate urine output of 6.3L net negative since admission and weight down 3kg. Patient appears euvolemic on examination and transitioned to PO lasix 80 mg daily.  Regimen at discharge:  -Continue PO lasix 895mdaily (home dose was 4017maily) -Continue Entresto 24-45m81mice daily -Continue spironolactone 12.5mg 9mly -Continue metoprolol 75mg 51my -Continue digoxin 125mcg 56my -Continue dapagliflozin 10mg da20m Sepsis and  pneumonia were ruled out. The empiric antibiotics on admission were discontinued.   #Hodgkin's lymphoma, chronic Patient missed CT surgery appointment scheduled for today due to his hospitalization. He is scheduled for a new patient appointment with Radiation Oncology tomorrow. -Follow-up with Rad Onc on 01/07/2021 -Reschedule CT surgery appointment (missed on 01/06/2021)   #T2DM, chronic Last A1c 8.1 in December. Patient came in on Lantus 25 units daily.  Started dapagliflozin 10mg dai30mpatient did not require any additional sliding scale insulin.    #Hypothyroidism, chronic -Continue levothyroxine 100mcg dai77m #Hyperlipidemia, chronic -Continue Lipitor 10 mg daily   #Paranoid schizophrenia, chronic -Continue home risperidone - Patient reports he is no longer receiving Invega SusKirt [place]harge Exam:   BP 93/70 (BP Location: Right Arm)   Pulse (!)Marland Kitchen  104   Temp (!) 97.5 F (36.4 C) (Oral)   Resp 20   Ht 5' 9"  (1.753 m)   Wt 113.3 kg   SpO2 98%   BMI 36.89 kg/m  Discharge exam:   Physical Exam Vitals reviewed.  Constitutional:      General: He is not in acute distress. Cardiovascular:     Rate and Rhythm: Normal rate and regular rhythm.     Heart sounds: Normal heart sounds.  Pulmonary:     Effort: Pulmonary effort is normal. No respiratory distress.     Breath sounds: Normal breath sounds. No wheezing, rhonchi or rales.  Abdominal:     General: Abdomen is flat. Bowel sounds are normal.     Palpations: Abdomen is soft.     Tenderness: There is no abdominal tenderness.  Musculoskeletal:     Right lower leg: No edema.     Left lower leg: No edema.  Pertinent Labs, Studies, and Procedures:  CMP Latest Ref Rng & Units 01/06/2021 01/05/2021 01/04/2021  Glucose 70 - 99 mg/dL 112(H) 112(H) -  BUN 6 - 20 mg/dL 20 16 -  Creatinine 0.61 - 1.24 mg/dL 1.30(H) 1.15 -  Sodium 135 - 145 mmol/L 137 141 142  Potassium 3.5 - 5.1 mmol/L 3.7 4.0 4.5  Chloride 98 - 111 mmol/L  96(L) 103 -  CO2 22 - 32 mmol/L 25 26 -  Calcium 8.9 - 10.3 mg/dL 9.1 9.3 -  Total Protein 6.5 - 8.1 g/dL - - -  Total Bilirubin 0.3 - 1.2 mg/dL - - -  Alkaline Phos 38 - 126 U/L - - -  AST 15 - 41 U/L - - -  ALT 0 - 44 U/L - - -   CBC Latest Ref Rng & Units 01/06/2021 01/05/2021 01/04/2021  WBC 4.0 - 10.5 K/uL 9.7 10.7(H) -  Hemoglobin 13.0 - 17.0 g/dL 15.6 14.7 16.0  Hematocrit 39.0 - 52.0 % 46.0 45.9 47.0  Platelets 150 - 400 K/uL 238 247 -   DG Chest 1 View  Result Date: 12/13/2020 CLINICAL DATA:  Chronic cough sharp RIGHT-sided chest pain in a 55 year old male EXAM: CHEST  1 VIEW COMPARISON:  11/29/2020 FINDINGS: Trachea midline. Mild cardiac enlargement is similar to previous imaging. Patchy bilateral opacities that were seen on the prior study not as well displayed on the current exam. Lung volumes slightly diminished. No lobar consolidative process. Study limited by technique and body habitus. On limited assessment no acute skeletal process. IMPRESSION: On balance stable exam accounting for technical factors in this patient with reported history of COVID-19 infection on prior exams. No lobar consolidative changes or sign of pleural effusion. Electronically Signed   By: Zetta Bills M.D.   On: 12/13/2020 09:35   DG Chest 2 View  Result Date: 01/05/2021 CLINICAL DATA:  Dyspnea. EXAM: CHEST - 2 VIEW COMPARISON:  01/04/2021 FINDINGS: 0952 hours. The diffuse bilateral airspace disease seen previously has decreased in the interval with persistent disease most prominent at the left base today. Small bilateral effusions. The cardio pericardial silhouette is enlarged. The visualized bony structures of the thorax show no acute abnormality. Telemetry leads overlie the chest. IMPRESSION: Interval improvement in bilateral airspace disease with persistent small effusions. Electronically Signed   By: Misty Stanley M.D.   On: 01/05/2021 10:23   DG Chest 2 View  Result Date: 12/21/2020 CLINICAL DATA:   55 year old male postoperative day 3 status post left video-assisted thoracoscopy for biopsy of mediastinal lymph nodes. Pulmonary edema. EXAM:  CHEST - 2 VIEW COMPARISON:  Portable chest 12/20/2020 and earlier. FINDINGS: AP and lateral views of the chest today. Stable right IJ central line. Continued low lung volumes. Stable cardiomegaly and mediastinal contours. No pneumothorax. No pleural effusion is evident. Since yesterday perihilar indistinct opacity has regressed and pulmonary vasculature appears improved. Bibasilar streaky opacity favored to be atelectasis. No acute osseous abnormality identified. Negative visible bowel gas pattern. IMPRESSION: 1. Stable right IJ central line. 2. Regressed edema and perihilar opacity since yesterday. On going low lung volumes with atelectasis. Electronically Signed   By: Genevie Ann M.D.   On: 12/21/2020 07:23   DG Chest 2 View  Result Date: 12/17/2020 CLINICAL DATA:  Lymphoma. Ischemic cardiomyopathy. Pre-op respiratory exam EXAM: CHEST - 2 VIEW COMPARISON:  01/02/2021 FINDINGS: Cardiomegaly and pulmonary vascular congestion are again seen. No evidence of pulmonary edema or focal infiltrate. No evidence of pleural effusion. IMPRESSION: Cardiomegaly and pulmonary vascular congestion. No active lung disease. Electronically Signed   By: Marlaine Hind M.D.   On: 12/17/2020 07:49   CT Angio Chest PE W and/or Wo Contrast  Result Date: 01/04/2021 CLINICAL DATA:  55 year old male with history of shortness of breath when lying down. EXAM: CT ANGIOGRAPHY CHEST WITH CONTRAST TECHNIQUE: Multidetector CT imaging of the chest was performed using the standard protocol during bolus administration of intravenous contrast. Multiplanar CT image reconstructions and MIPs were obtained to evaluate the vascular anatomy. CONTRAST:  126m OMNIPAQUE IOHEXOL 300 MG/ML  SOLN COMPARISON:  Chest CTA 11/29/2020. FINDINGS: Cardiovascular: Study is limited by considerable patient respiratory motion. With  these limitations in mind, there is no central, lobar or segmental sized filling defects to suggest clinically significant pulmonary embolism. Smaller subsegmental sized pulmonary emboli cannot be entirely excluded on the basis of today's examination. Heart size is enlarged with left ventricular dilatation. There is no significant pericardial fluid, thickening or pericardial calcification. Aortic atherosclerosis. No definite coronary artery calcifications. Mediastinum/Nodes: Enlarged anterior mediastinal lymph nodes measuring up to 2.4 cm in short axis (axial image 48 of series 5). Multiple other prominent borderline enlarged mediastinal and hilar lymph nodes are also noted. Esophagus is unremarkable in appearance. No axillary lymphadenopathy. Lungs/Pleura: Widespread areas of ground-glass attenuation and interlobular septal thickening noted throughout the lungs bilaterally, the appearance of which is concerning for a background of pulmonary edema. In addition, in the periphery of the right upper lobe (axial image 43 of series 7) there is a more dense nodular area of apparent airspace consolidation measuring 2.3 x 1.6 cm. Small to moderate bilateral pleural effusions lying dependently. Upper Abdomen: Unremarkable. Musculoskeletal: Status post laminectomy at T5-T6. There are no aggressive appearing lytic or blastic lesions noted in the visualized portions of the skeleton. Review of the MIP images confirms the above findings. IMPRESSION: 1. Despite the limitations of today's examination, there is no evidence to suggest clinically significant central, lobar or segmental sized pulmonary embolism. 2. Cardiomegaly with left ventricular dilatation, evidence of pulmonary edema and small to moderate bilateral pleural effusions; imaging findings concerning for congestive heart failure. 3. More nodular focus of probable airspace consolidation in the periphery of the right upper lobe measuring 2.3 x 1.6 cm. This is favored to  be of infectious or inflammatory etiology. However, attention on short-term follow-up noncontrast chest CT is recommended in 3 months to ensure complete resolution of this finding, as the possibility of neoplasm is not entirely excluded. At the time of the follow-up imaging, attention should also be directed to the mediastinal lymphadenopathy noted on  today's examination. This lymphadenopathy is nonspecific, and can be seen in the setting of congestive heart failure, however, the possibility of lymphoproliferative disorder or other neoplasm warrants of exclusion. 4. Aortic atherosclerosis. Aortic Atherosclerosis (ICD10-I70.0). Electronically Signed   By: Vinnie Langton M.D.   On: 01/04/2021 09:52   US Abdomen Limited  Result Date: 01/04/2021 CLINICAL DATA:  Abdominal distension. EXAM: ULTRASOUND ABDOMEN LIMITED RIGHT UPPER QUADRANT COMPARISON:  None. FINDINGS: Gallbladder: Surgically absent. Common bile duct: Diameter: 3 mm, within normal limits. Liver: No focal lesion identified. Within normal limits in parenchymal echogenicity. Portal vein is patent on color Doppler imaging with normal direction of blood flow towards the liver. Other: Bilateral pleural effusions noted. IMPRESSION: Prior cholecystectomy.  No evidence of biliary ductal dilatation. Bilateral pleural effusions noted. Electronically Signed   By: Marlaine Hind M.D.   On: 01/04/2021 19:16   DG Chest Portable 1 View  Result Date: 01/04/2021 CLINICAL DATA:  55 year old male with shortness of breath. Abdominal pain. Status post left video-assisted thoracoscopy for biopsy of mediastinal lymph nodes earlier this month. EXAM: PORTABLE CHEST 1 VIEW COMPARISON:  Chest radiographs 12/21/2020 and earlier. FINDINGS: Portable AP upright view at 0431 hours. Right IJ central line earlier this month has been removed. Stable mediastinal contours. Mild cardiomegaly. Streaky asymmetric pulmonary opacity has increased in both lungs but is greater on the right. No  pneumothorax. No pleural effusion is evident. No air bronchograms identified. No acute osseous abnormality identified. Paucity of bowel gas in the upper abdomen. IMPRESSION: 1. New streaky and asymmetric widespread pulmonary opacity, greater in the right lung. This is nonspecific with differential considerations including bilateral pneumonia, aspiration, asymmetric pulmonary edema. 2. Stable cardiomegaly. Electronically Signed   By: Genevie Ann M.D.   On: 01/04/2021 04:50   DG Chest Port 1 View  Result Date: 12/20/2020 CLINICAL DATA:  Status post chest tube removal EXAM: PORTABLE CHEST 1 VIEW COMPARISON:  December 19, 2020 FINDINGS: Left chest tube is been removed. Central catheter tip is in the superior vena cava. No pneumothorax evident. There is cardiomegaly with pulmonary venous hypertension. There is ill-defined opacity in the right perihilar region as well as areas of interstitial pulmonary edema. No appreciable new opacity. No bone lesions. No adenopathy. IMPRESSION: No evident pneumothorax. Cardiomegaly with pulmonary vascular congestion. There is a degree of interstitial edema. Ill-defined opacity right perihilar region likely represents alveolar edema. The overall appearance suggests a degree of underlying congestive heart failure. Electronically Signed   By: Lowella Grip III M.D.   On: 12/20/2020 08:40   DG Chest Port 1 View  Result Date: 12/19/2020 CLINICAL DATA:  Status post chest tube placement EXAM: PORTABLE CHEST 1 VIEW COMPARISON:  12/18/2020 FINDINGS: Cardiac shadow is mildly enlarged but stable. Right jugular central line is again seen. Left-sided chest tube is again noted and stable. No pneumothorax is seen. Stable changes of vascular congestion and edema. No new focal abnormality is noted. IMPRESSION: Stable appearance of the chest when compared with the prior exam. No pneumothorax is noted. Electronically Signed   By: Inez Catalina M.D.   On: 12/19/2020 08:06   DG Chest Port 1  View  Result Date: 12/18/2020 CLINICAL DATA:  Status post thoracoscopy EXAM: PORTABLE CHEST 1 VIEW COMPARISON:  12/16/2020 FINDINGS: Cardiac shadow is enlarged but stable. Right jugular central line is in the superior vena cava. Left-sided chest tube is noted in place. No pneumothorax is seen. Generalized increased density is noted throughout both lungs likely related to mild edema and vascular  congestion. No other focal abnormality is noted. IMPRESSION: No evidence of pneumothorax. Tubes and lines as described. Changes consistent with mild pulmonary edema. Electronically Signed   By: Inez Catalina M.D.   On: 12/18/2020 14:58   ECHOCARDIOGRAM COMPLETE  Result Date: 01/04/2021    ECHOCARDIOGRAM REPORT   Patient Name:   Charles [name] Date of Exam: 01/04/2021 Medical Rec #:  [id number]   Height:       69.0 in Accession #:    [id number]  Weight:       256.4 lb Date of Birth:  10-09-1966    BSA:          2.296 m Patient Age:    68 years    BP:           104/51 mmHg Patient Gender: M           HR:           117 bpm. Exam Location:  Inpatient Procedure: 2D Echo, Cardiac Doppler, Color Doppler and Intracardiac            Opacification Agent STAT ECHO Indications:    Dyspnea R06.00  History:        Patient has prior history of Echocardiogram examinations, most                 recent 10/11/2020. Cardiomyopathy and CHF; Risk                 Factors:Hypertension, Diabetes and Current Smoker.  Sonographer:    Vickie Epley RDCS Referring Phys: [id number] Oda Kilts  Sonographer Comments: Technically difficult study due to poor echo windows. IMPRESSIONS  1. Technically difficult; definity used; right heart not well visualized.  2. Left ventricular ejection fraction, by estimation, is 20 to 25%. The left ventricle has severely decreased function. The left ventricle demonstrates global hypokinesis. The left ventricular internal cavity size was severely dilated. Left ventricular diastolic parameters are consistent with Grade III  diastolic dysfunction (restrictive).  3. Right ventricular systolic function is normal. The right ventricular size is normal.  4. The mitral valve is normal in structure. Mild mitral valve regurgitation. No evidence of mitral stenosis.  5. The aortic valve is tricuspid. Aortic valve regurgitation is trivial. No aortic stenosis is present.  6. The inferior vena cava is normal in size with greater than 50% respiratory variability, suggesting right atrial pressure of 3 mmHg. FINDINGS  Left Ventricle: Left ventricular ejection fraction, by estimation, is 20 to 25%. The left ventricle has severely decreased function. The left ventricle demonstrates global hypokinesis. Definity contrast agent was given IV to delineate the left ventricular endocardial borders. The left ventricular internal cavity size was severely dilated. There is no left ventricular hypertrophy. Left ventricular diastolic parameters are consistent with Grade III diastolic dysfunction (restrictive). Right Ventricle: The right ventricular size is normal. Right ventricular systolic function is normal. Left Atrium: Left atrial size was normal in size. Right Atrium: Right atrial size was normal in size. Pericardium: There is no evidence of pericardial effusion. Mitral Valve: The mitral valve is normal in structure. Mild mitral valve regurgitation. No evidence of mitral valve stenosis. Tricuspid Valve: The tricuspid valve is normal in structure. Tricuspid valve regurgitation is trivial. No evidence of tricuspid stenosis. Aortic Valve: The aortic valve is tricuspid. Aortic valve regurgitation is trivial. No aortic stenosis is present. Pulmonic Valve: The pulmonic valve was not well visualized. Pulmonic valve regurgitation is not visualized. No evidence of pulmonic stenosis. Aorta: The aortic root  is normal in size and structure. Venous: The inferior vena cava is normal in size with greater than 50% respiratory variability, suggesting right atrial pressure of 3  mmHg. IAS/Shunts: The interatrial septum was not well visualized. Additional Comments: Technically difficult; definity used; right heart not well visualized.  LEFT VENTRICLE PLAX 2D LVIDd:         7.40 cm LVIDs:         6.70 cm LV PW:         0.70 cm LV IVS:        0.70 cm LVOT diam:     2.20 cm LV SV:         21 LV SV Index:   9 LVOT Area:     3.80 cm  RIGHT VENTRICLE TAPSE (M-mode): 1.2 cm LEFT ATRIUM           Index       RIGHT ATRIUM           Index LA diam:      2.80 cm 1.22 cm/m  RA Area:     15.40 cm LA Vol (A2C): 42.4 ml 18.47 ml/m RA Volume:   36.30 ml  15.81 ml/m LA Vol (A4C): 49.3 ml 21.47 ml/m  AORTIC VALVE LVOT Vmax:   46.00 cm/s LVOT Vmean:  34.300 cm/s LVOT VTI:    0.056 m  AORTA Ao Root diam: 3.50 cm MR Peak grad: 49.8 mmHg MR Mean grad: 31.0 mmHg   SHUNTS MR Vmax:      353.00 cm/s Systemic VTI:  0.06 m MR Vmean:     263.0 cm/s  Systemic Diam: 2.20 cm Kirk Ruths MD Electronically signed by Kirk Ruths MD Signature Date/Time: 01/04/2021/11:55:33 AM    Final    Discharge Instructions: Discharge Instructions     (HEART FAILURE PATIENTS) Call MD:  Anytime you have any of the following symptoms: 1) 3 pound weight gain in 24 hours or 5 pounds in 1 week 2) shortness of breath, with or without a dry hacking cough 3) swelling in the hands, feet or stomach 4) if you have to sleep on extra pillows at night in order to breathe.   Complete by: As directed    Diet - low sodium heart healthy   Complete by: As directed    Increase activity slowly   Complete by: As directed    No wound care   Complete by: As directed        Signed: Madalyn Rob, MD 01/06/2021, 3:30 PM

## 2021-01-06 NOTE — Progress Notes (Signed)
Physical Therapy Treatment Patient Details Name: Charles [name] MRN: [id number] DOB: Mar 31, 1966 Today's Date: 01/06/2021    History of Present Illness Charles [name] is a 55 year old gentleman with past medical history of HFrEF, type 2 diabetes, hypertension, hyperlipidemia, paranoid schizophrenia, newly diagnosed Hodgkin's lymphoma, who presented to the ED for worsening shortness of breath. Admitted with acute herat failure exacerbation.    PT Comments    Pt received in chair, agreeable to therapy session and with good participation and tolerance for functional mobility tasks. Pt performed transfers and gait training in hallway with Supervision, HR elevated to 130 bpm during mobility tasks but improves with 1 minute standing break, SpO2 WNL on RA. Reviewed seated LE therapeutic exercises for BLE strengthening, pt performed with good technique/no difficulty as detailed below. Pt continues to benefit from PT services to progress toward functional mobility goals. Anticipate pt safe to DC to ALF once medically cleared.    Follow Up Recommendations  No PT follow up;Supervision for mobility/OOB     Equipment Recommendations  None recommended by PT    Recommendations for Other Services       Precautions / Restrictions Precautions Precautions: Fall Restrictions Weight Bearing Restrictions: No    Mobility  Bed Mobility Overal bed mobility: Independent                  Transfers Overall transfer level: Needs assistance Equipment used: Rolling walker (2 wheeled) Transfers: Sit to/from Stand Sit to Stand: Supervision         General transfer comment: from chair<>RW  Ambulation/Gait Ambulation/Gait assistance: Supervision Gait Distance (Feet): 130 Feet Assistive device: Rolling walker (2 wheeled) Gait Pattern/deviations: Step-through pattern;Decreased stride length Gait velocity: decreased   General Gait Details: Slow and steady pace, no gross imbalance noted, intermittent cues  for upright posture and less reliance on RW   Stairs             Wheelchair Mobility    Modified Rankin (Stroke Patients Only)       Balance Overall balance assessment: Mild deficits observed, not formally tested                                          Cognition Arousal/Alertness: Awake/alert Behavior During Therapy: WFL for tasks assessed/performed Overall Cognitive Status: Within Functional Limits for tasks assessed                                 General Comments: Alert and oriented; takes time for processing. pt appears close to baseline      Exercises General Exercises - Lower Extremity Long Arc Quad: AROM;Both;5 reps;Seated Hip Flexion/Marching: AROM;Both;10 reps;Seated Other Exercises Other Exercises:  (STS x2 for BLE strengthening and gait billed as TE for strengthening)    General Comments General comments (skin integrity, edema, etc.): O2 sats >92% on RA during amb; HR 110-130 bpm with exertion, cues for standing rest break when HR >120 bpm, BP stable no dizziness      Pertinent Vitals/Pain Pain Assessment: No/denies pain    Home Living                      Prior Function            PT Goals (current goals can now be found in the care plan section) Acute Rehab  PT Goals Patient Stated Goal: to go back to ALF PT Goal Formulation: With patient Time For Goal Achievement: 01/19/21 Potential to Achieve Goals: Good Progress towards PT goals: Progressing toward goals    Frequency    Min 3X/week      PT Plan Current plan remains appropriate    Co-evaluation              AM-PAC PT "6 Clicks" Mobility   Outcome Measure  Help needed turning from your back to your side while in a flat bed without using bedrails?: None Help needed moving from lying on your back to sitting on the side of a flat bed without using bedrails?: None Help needed moving to and from a bed to a chair (including a  wheelchair)?: None Help needed standing up from a chair using your arms (e.g., wheelchair or bedside chair)?: A Little Help needed to walk in hospital room?: A Little Help needed climbing 3-5 steps with a railing? : A Little 6 Click Score: 21    End of Session Equipment Utilized During Treatment: Gait belt Activity Tolerance: Patient tolerated treatment well Patient left: in chair;with call bell/phone within reach Nurse Communication: Mobility status PT Visit Diagnosis: Difficulty in walking, not elsewhere classified (R26.2)     Time: 8466-5993 PT Time Calculation (min) (ACUTE ONLY): 11 min  Charges:  $Therapeutic Exercise: 8-22 mins                     Malania Gawthrop P., PTA Acute Rehabilitation Services Pager: [phone] Office: [place] 01/06/2021, 4:09 PM

## 2021-01-06 NOTE — Progress Notes (Signed)
RT at bedside to find pt on .

## 2021-01-06 NOTE — Progress Notes (Signed)
SATURATION QUALIFICATIONS: (This note is used to comply with regulatory documentation for home oxygen)  Patient Saturations on Room Air at Rest = 95%  Patient Saturations on Room Air while Ambulating = 87%  Patient Saturations on 2 Liters of oxygen while Ambulating = 95%  Please briefly explain why patient needs home oxygen: Pt's oxygen level was 95% on room air before ambulation.  Pt walked for 5 minutes. Pt's o2 decreased to 87%.  2 L of oxygen applied via nasal cannula.  Pt's oxygen level increased to 95% on the 2L.  Pt's oyxgen level post ambulation was 95%.

## 2021-01-06 NOTE — TOC Initial Note (Addendum)
Transition of Care [facility]) - Initial/Assessment Note    Patient Details  Name: Charles [name] MRN: [id number] Date of Birth: December 13, 1965  [facility] CM/SW Contact:    [name], RN Phone Number: 01/06/2021, 3:13 PM  Clinical Narrative:                 [name] for dc today back to the [facility]F via [place].  He will need oxygen, [employer] is delivering the oxygen to the ALF by 4 pm, Ptar is scheduled ,they have several in front of patient. Staff RN to call report to [phone].  Expected Discharge Plan: Assisted Living Barriers to Discharge: No Barriers Identified   Patient Goals and CMS Choice Patient states their goals for this hospitalization and ongoing recovery are:: ALF   Choice offered to / list presented to : NA  Expected Discharge Plan and Services Expected Discharge Plan: Assisted Living   Discharge Planning Services: CM Consult Post Acute Care Choice: NA Living arrangements for the past 2 months: [place]                   DME Agency: NA       HH Arranged: NA          Prior Living Arrangements/Services Living arrangements for the past 2 months: [place] Lives with:: Facility Resident Patient language and need for interpreter reviewed:: Yes Do you feel safe going back to the place where you live?: Yes      Need for Family Participation in Patient Care: Yes (Comment) Care giver support system in place?: Yes (comment)   Criminal Activity/Legal Involvement Pertinent to Current Situation/Hospitalization: No - Comment as needed  Activities of Daily Living      Permission Sought/Granted                  Emotional Assessment       Orientation: : Oriented to Self,Oriented to Place,Oriented to  Time,Oriented to Situation Alcohol / Substance Use: Not Applicable Psych Involvement: No (comment)  Admission diagnosis:  SOB (shortness of breath) [R06.02] Hypoxia [R09.02] Acute  exacerbation of congestive heart failure ([place]) [I50.9] Patient Active Problem List   Diagnosis Date Noted  . Schizophrenia ([place]) 01/04/2021  . Diarrhea 01/04/2021  . Pulmonary edema   . Mediastinal adenopathy 12/18/2020  . Adjustment disorder with mixed anxiety and depressed mood 10/30/2020  . Acute on chronic systolic heart failure ([place])   . Mediastinal lymphadenopathy 10/11/2020  . Shortness of breath 10/11/2020  . Sinus tachycardia 10/11/2020  . Night sweats 10/11/2020  . DM2 (diabetes mellitus, type 2) ([place]) 10/11/2020  . Lymphoma ([place]) 10/11/2020  . Hypertension 09/25/2020  . BPH (benign prostatic hyperplasia) 09/25/2020  . Insomnia 09/25/2020   PCP:  Patient, No Pcp Per Pharmacy:   [facility] [place], [place] [address] [place] [address] Phone: [phone] Fax: [phone]     Social Determinants of Health ([place]) Interventions    Readmission Risk Interventions Readmission Risk Prevention Plan 01/06/2021  Medication Review ([place]) Complete  HRI or [place] Complete  SW Recovery Care/Counseling Consult Complete  [place] Not Applicable  Some recent data might be hidden

## 2021-01-06 NOTE — Progress Notes (Signed)
Progress Note  Patient Name: Charles [name] Date of Encounter: 01/06/2021  [facility] Cardiologist: [name], MD   Subjective   Improving but still gets short of breath at night.  He wants to have O2 therapy at home.  Still having significant diuresis.  Creatinine is starting up this morning.  Inpatient Medications    Scheduled Meds: . atorvastatin  10 mg Oral q1800  . brimonidine  1 drop Both Eyes BID  . cyclobenzaprine  10 mg Oral TID  . dapagliflozin propanediol  10 mg Oral Daily  . digoxin  125 mcg Oral Daily  . enoxaparin (LOVENOX) injection  55 mg Subcutaneous Q24H  . furosemide  80 mg Oral Daily  . insulin aspart  0-15 Units Subcutaneous TID WC  . latanoprost  1 drop Both Eyes QHS  . levothyroxine  100 mcg Oral QAC breakfast  . melatonin  3 mg Oral QHS  . metoprolol succinate  75 mg Oral QHS  . pneumococcal 23 valent vaccine  0.5 mL Intramuscular Tomorrow-1000  . risperiDONE  0.5 mg Oral QHS  . sacubitril-valsartan  1 tablet Oral BID  . spironolactone  12.5 mg Oral Daily  . tamsulosin  0.4 mg Oral Daily   Continuous Infusions:  PRN Meds: acetaminophen **OR** acetaminophen, ondansetron **OR** ondansetron (ZOFRAN) IV   Vital Signs    Vitals:   01/05/21 2357 01/06/21 0344 01/06/21 0750 01/06/21 0905  BP: 99/66 96/67 97/78  (!) 124/95  Pulse: 79 86 (!) 110 (!) 107  Resp: 18 18 20 16   Temp: 98 F (36.7 C) 97.6 F (36.4 C) 98.3 F (36.8 C)   TempSrc: Oral Oral Oral   SpO2: 92% 98% 93% 97%  Weight:  113.3 kg    Height:        Intake/Output Summary (Last 24 hours) at 01/06/2021 1025 Last data filed at 01/06/2021 0750 Gross per 24 hour  Intake 1657 ml  Output 4424 ml  Net -2767 ml   Last 3 Weights 01/06/2021 01/05/2021 01/05/2021  Weight (lbs) 249 lb 12.5 oz 252 lb 6.8 oz 252 lb 6.8 oz  Weight (kg) 113.3 kg 114.5 kg 114.5 kg      Telemetry    Normal sinus rhythm- Personally Reviewed  ECG    Sinus tachycardia with rightward axis.  Baseline  artifact.- Personally Reviewed  Physical Exam  Sitting in chair. GEN: No acute distress.   Neck: No JVD while sitting upright. Cardiac: RRR, no murmurs, rubs, or gallops.  Respiratory:  Faint basilar rales while sitting up. GI: Soft, nontender, non-distended  MS:  Left greater than right lower extremity ankle and shin edema.. Neuro:  Nonfocal  Psych: Normal affect   Labs    High Sensitivity Troponin:   Recent Labs  Lab 12/20/20 0912 12/20/20 1304 01/04/21 0848 01/04/21 1025  TROPONINIHS 29* 34* 48* 52*      Chemistry Recent Labs  Lab 01/01/21 1308 01/04/21 0507 01/04/21 0904 01/05/21 0218 01/06/21 0311  NA 140 138 142 141 137  K 4.5 4.5 4.5 4.0 3.7  CL 107 107  --  103 96*  CO2 24 20*  --  26 25  GLUCOSE 106* 143*  --  112* 112*  BUN 12 12  --  16 20  CREATININE 1.22 1.22  --  1.15 1.30*  CALCIUM 9.3 9.1  --  9.3 9.1  PROT 7.3 6.8  --   --   --   ALBUMIN 3.4* 3.4*  --   --   --  AST 12* 25  --   --   --   ALT 23 28  --   --   --   ALKPHOS 132* 101  --   --   --   BILITOT 0.3 0.5  --   --   --   GFRNONAA >60 >60  --  >60 >60  ANIONGAP 9 11  --  12 16*     Hematology Recent Labs  Lab 01/04/21 0507 01/04/21 0904 01/05/21 0218 01/06/21 0311  WBC 12.1*  --  10.7* 9.7  RBC 5.85*  --  5.85* 6.05*  HGB 14.6 16.0 14.7 15.6  HCT 46.3 47.0 45.9 46.0  MCV 79.1*  --  78.5* 76.0*  MCH 25.0*  --  25.1* 25.8*  MCHC 31.5  --  32.0 33.9  RDW 17.9*  --  18.2* 18.0*  PLT 292  --  247 238    BNP Recent Labs  Lab 01/04/21 0507  BNP 592.9*     DDimer No results for input(s): DDIMER in the last 168 hours.   Radiology    DG Chest 2 View  Result Date: 01/05/2021 CLINICAL DATA:  Dyspnea. EXAM: CHEST - 2 VIEW COMPARISON:  01/04/2021 FINDINGS: 0952 hours. The diffuse bilateral airspace disease seen previously has decreased in the interval with persistent disease most prominent at the left base today. Small bilateral effusions. The cardio pericardial silhouette is  enlarged. The visualized bony structures of the thorax show no acute abnormality. Telemetry leads overlie the chest. IMPRESSION: Interval improvement in bilateral airspace disease with persistent small effusions. Electronically Signed   By: Misty Stanley M.D.   On: 01/05/2021 10:23   US Abdomen Limited  Result Date: 01/04/2021 CLINICAL DATA:  Abdominal distension. EXAM: ULTRASOUND ABDOMEN LIMITED RIGHT UPPER QUADRANT COMPARISON:  None. FINDINGS: Gallbladder: Surgically absent. Common bile duct: Diameter: 3 mm, within normal limits. Liver: No focal lesion identified. Within normal limits in parenchymal echogenicity. Portal vein is patent on color Doppler imaging with normal direction of blood flow towards the liver. Other: Bilateral pleural effusions noted. IMPRESSION: Prior cholecystectomy.  No evidence of biliary ductal dilatation. Bilateral pleural effusions noted. Electronically Signed   By: Marlaine Hind M.D.   On: 01/04/2021 19:16   ECHOCARDIOGRAM COMPLETE  Result Date: 01/04/2021    ECHOCARDIOGRAM REPORT   Patient Name:   Charles [name] Date of Exam: 01/04/2021 Medical Rec #:  [id number]   Height:       69.0 in Accession #:    [id number]  Weight:       256.4 lb Date of Birth:  06-05-1966    BSA:          2.296 m Patient Age:    55 years    BP:           104/51 mmHg Patient Gender: M           HR:           117 bpm. Exam Location:  Inpatient Procedure: 2D Echo, Cardiac Doppler, Color Doppler and Intracardiac            Opacification Agent STAT ECHO Indications:    Dyspnea R06.00  History:        Patient has prior history of Echocardiogram examinations, most                 recent 10/11/2020. Cardiomyopathy and CHF; Risk                 Factors:Hypertension, Diabetes  and Current Smoker.  Sonographer:    Vickie Epley RDCS Referring Phys: [id number] Oda Kilts  Sonographer Comments: Technically difficult study due to poor echo windows. IMPRESSIONS  1. Technically difficult; definity used; right heart not well  visualized.  2. Left ventricular ejection fraction, by estimation, is 20 to 25%. The left ventricle has severely decreased function. The left ventricle demonstrates global hypokinesis. The left ventricular internal cavity size was severely dilated. Left ventricular diastolic parameters are consistent with Grade III diastolic dysfunction (restrictive).  3. Right ventricular systolic function is normal. The right ventricular size is normal.  4. The mitral valve is normal in structure. Mild mitral valve regurgitation. No evidence of mitral stenosis.  5. The aortic valve is tricuspid. Aortic valve regurgitation is trivial. No aortic stenosis is present.  6. The inferior vena cava is normal in size with greater than 50% respiratory variability, suggesting right atrial pressure of 3 mmHg. FINDINGS  Left Ventricle: Left ventricular ejection fraction, by estimation, is 20 to 25%. The left ventricle has severely decreased function. The left ventricle demonstrates global hypokinesis. Definity contrast agent was given IV to delineate the left ventricular endocardial borders. The left ventricular internal cavity size was severely dilated. There is no left ventricular hypertrophy. Left ventricular diastolic parameters are consistent with Grade III diastolic dysfunction (restrictive). Right Ventricle: The right ventricular size is normal. Right ventricular systolic function is normal. Left Atrium: Left atrial size was normal in size. Right Atrium: Right atrial size was normal in size. Pericardium: There is no evidence of pericardial effusion. Mitral Valve: The mitral valve is normal in structure. Mild mitral valve regurgitation. No evidence of mitral valve stenosis. Tricuspid Valve: The tricuspid valve is normal in structure. Tricuspid valve regurgitation is trivial. No evidence of tricuspid stenosis. Aortic Valve: The aortic valve is tricuspid. Aortic valve regurgitation is trivial. No aortic stenosis is present. Pulmonic Valve:  The pulmonic valve was not well visualized. Pulmonic valve regurgitation is not visualized. No evidence of pulmonic stenosis. Aorta: The aortic root is normal in size and structure. Venous: The inferior vena cava is normal in size with greater than 50% respiratory variability, suggesting right atrial pressure of 3 mmHg. IAS/Shunts: The interatrial septum was not well visualized. Additional Comments: Technically difficult; definity used; right heart not well visualized.  LEFT VENTRICLE PLAX 2D LVIDd:         7.40 cm LVIDs:         6.70 cm LV PW:         0.70 cm LV IVS:        0.70 cm LVOT diam:     2.20 cm LV SV:         21 LV SV Index:   9 LVOT Area:     3.80 cm  RIGHT VENTRICLE TAPSE (M-mode): 1.2 cm LEFT ATRIUM           Index       RIGHT ATRIUM           Index LA diam:      2.80 cm 1.22 cm/m  RA Area:     15.40 cm LA Vol (A2C): 42.4 ml 18.47 ml/m RA Volume:   36.30 ml  15.81 ml/m LA Vol (A4C): 49.3 ml 21.47 ml/m  AORTIC VALVE LVOT Vmax:   46.00 cm/s LVOT Vmean:  34.300 cm/s LVOT VTI:    0.056 m  AORTA Ao Root diam: 3.50 cm MR Peak grad: 49.8 mmHg MR Mean grad: 31.0 mmHg   SHUNTS MR  Vmax:      353.00 cm/s Systemic VTI:  0.06 m MR Vmean:     263.0 cm/s  Systemic Diam: 2.20 cm Kirk Ruths MD Electronically signed by Kirk Ruths MD Signature Date/Time: 01/04/2021/11:55:33 AM    Final     Cardiac Studies   No new cardiac studies.  Echo result is noted above.  Patient Profile     55 y.o. male with history of nonischemic cardiomyopathy producing chronic systolic heart failure, previously documented normal coronary arteries, diabetes mellitus type 2, primary hypertension, recent diagnosis of Hodgkin's Lymphoma, bipolar disorder with suicidal ideation, recent heart failure clinic visit and found not on diuretic therapy which was resumed 12/30/2020, now admitted with decompensated heart failure.  Assessment & Plan    1. Acute on chronic systolic heart failure: We will likely convert to oral diuretic  therapy in a.m., furosemide 40 mg each a.m.  Continue MRA.Marland Kitchen  Ulltimately, beta-blocker/SGLT2/Entresto/MRA should be backbone therapy.  Consider ivabradine and titrate diuretic therapy to maintain euvolemia. 2. Pneumonia, with sepsis: Currently on broad-spectrum antibiotic therapy 3. Lymphoma, Hodgkin's: Not yet treated       For questions or updates, please contact [place] Please consult [email or web address] for contact info under        Signed, Sinclair Grooms, MD  01/06/2021, 10:25 AM

## 2021-01-06 NOTE — Plan of Care (Signed)

## 2021-01-07 ENCOUNTER — Ambulatory Visit: Payer: Medicaid Other

## 2021-01-07 ENCOUNTER — Ambulatory Visit: Payer: Medicaid Other | Admitting: Radiation Oncology

## 2021-01-11 ENCOUNTER — Emergency Department (HOSPITAL_COMMUNITY)
Admission: EM | Admit: 2021-01-11 | Discharge: 2021-01-11 | Disposition: A | Discharge status: Home / Self Care | Payer: Medicaid Other | Attending: Emergency Medicine | Admitting: Emergency Medicine

## 2021-01-11 ENCOUNTER — Other Ambulatory Visit: Payer: Self-pay

## 2021-01-11 ENCOUNTER — Emergency Department (HOSPITAL_COMMUNITY): Payer: Medicaid Other

## 2021-01-11 DIAGNOSIS — F17210 Nicotine dependence, cigarettes, uncomplicated: Secondary | ICD-10-CM | POA: Diagnosis not present

## 2021-01-11 DIAGNOSIS — Z8616 Personal history of COVID-19: Secondary | ICD-10-CM | POA: Diagnosis not present

## 2021-01-11 DIAGNOSIS — I110 Hypertensive heart disease with heart failure: Secondary | ICD-10-CM | POA: Insufficient documentation

## 2021-01-11 DIAGNOSIS — Z79899 Other long term (current) drug therapy: Secondary | ICD-10-CM | POA: Diagnosis not present

## 2021-01-11 DIAGNOSIS — R0602 Shortness of breath: Secondary | ICD-10-CM | POA: Diagnosis present

## 2021-01-11 DIAGNOSIS — Z794 Long term (current) use of insulin: Secondary | ICD-10-CM | POA: Insufficient documentation

## 2021-01-11 DIAGNOSIS — I5023 Acute on chronic systolic (congestive) heart failure: Secondary | ICD-10-CM | POA: Diagnosis not present

## 2021-01-11 DIAGNOSIS — E119 Type 2 diabetes mellitus without complications: Secondary | ICD-10-CM | POA: Diagnosis not present

## 2021-01-11 DIAGNOSIS — I509 Heart failure, unspecified: Secondary | ICD-10-CM

## 2021-01-11 LAB — URINALYSIS, ROUTINE W REFLEX MICROSCOPIC
Bilirubin Urine: NEGATIVE
Glucose, UA: NEGATIVE mg/dL
Hgb urine dipstick: NEGATIVE
Ketones, ur: NEGATIVE mg/dL
Leukocytes,Ua: NEGATIVE
Nitrite: NEGATIVE
Protein, ur: NEGATIVE mg/dL
Specific Gravity, Urine: 1.006 (ref 1.005–1.030)
pH: 6 (ref 5.0–8.0)

## 2021-01-11 LAB — COMPREHENSIVE METABOLIC PANEL
ALT: 23 U/L (ref 0–44)
AST: 21 U/L (ref 15–41)
Albumin: 3.5 g/dL (ref 3.5–5.0)
Alkaline Phosphatase: 105 U/L (ref 38–126)
Anion gap: 9 (ref 5–15)
BUN: 10 mg/dL (ref 6–20)
CO2: 20 mmol/L — ABNORMAL LOW (ref 22–32)
Calcium: 9.1 mg/dL (ref 8.9–10.3)
Chloride: 105 mmol/L (ref 98–111)
Creatinine, Ser: 1.14 mg/dL (ref 0.61–1.24)
GFR, Estimated: >60 mL/min (ref >60)
Glucose, Bld: 114 mg/dL — ABNORMAL HIGH (ref 70–99)
Potassium: 4.2 mmol/L (ref 3.5–5.1)
Sodium: 134 mmol/L — ABNORMAL LOW (ref 135–145)
Total Bilirubin: 0.4 mg/dL (ref 0.3–1.2)
Total Protein: 7.2 g/dL (ref 6.5–8.1)

## 2021-01-11 LAB — CBC
HCT: 46.4 % (ref 39.0–52.0)
Hemoglobin: 15.3 g/dL (ref 13.0–17.0)
MCH: 25.8 pg — ABNORMAL LOW (ref 26.0–34.0)
MCHC: 33 g/dL (ref 30.0–36.0)
MCV: 78.1 fL — ABNORMAL LOW (ref 80.0–100.0)
Platelets: 228 10*3/uL (ref 150–400)
RBC: 5.94 MIL/uL — ABNORMAL HIGH (ref 4.22–5.81)
RDW: 18 % — ABNORMAL HIGH (ref 11.5–15.5)
WBC: 10.8 10*3/uL — ABNORMAL HIGH (ref 4.0–10.5)
nRBC: 0 % (ref 0.0–0.2)

## 2021-01-11 LAB — TROPONIN I (HIGH SENSITIVITY)
Troponin I (High Sensitivity): 18 ng/L — ABNORMAL HIGH (ref <18)
Troponin I (High Sensitivity): 20 ng/L — ABNORMAL HIGH (ref <18)

## 2021-01-11 LAB — LIPASE, BLOOD: Lipase: 32 U/L (ref 11–51)

## 2021-01-11 LAB — BRAIN NATRIURETIC PEPTIDE: B Natriuretic Peptide: 438.6 pg/mL — ABNORMAL HIGH (ref 0.0–100.0)

## 2021-01-11 MED ORDER — FUROSEMIDE 10 MG/ML IJ SOLN
80.0000 mg | Freq: Once | INTRAMUSCULAR | Status: AC
  Administered 2021-01-11: 80 mg via INTRAVENOUS
  Filled 2021-01-11: qty 8

## 2021-01-11 NOTE — ED Notes (Addendum)
Social work stated that this has happened before with this specific group home and advised to send [employer] out for a safety check due to staff not being onsite in the past.

## 2021-01-11 NOTE — ED Provider Notes (Signed)
[place] [facility] EMERGENCY DEPARTMENT Provider Note   CSN: [id number] Arrival date & time: 01/11/21  1121     History Chief Complaint  Patient presents with  . Abdominal Pain  . Shortness of Breath    Charles [name] is a 55 y.o. male w/ hx of nonischemic cardiomyopathy (EF 20-25% in Feb 2022 echo), bipolar disorder, diabetes type 2, MI, HTN, presenting from group home with concern for SOB x 2 days.  He was just discharged in the hospital a week ago on 2 L nasal cannula after suspected CHF exacerbation.  He says he been doing well at home for the past few days (he lives in an assisted living or group home), but he feels that there was a new assistant coming out yesterday and today it is not giving his medications correctly.  He states that she was going based on "what was in the computer, and not or on the discharge papers."  He does not feel has been urinating normally for the past 2 days, feels like he has been urinating less.  He describes worsening orthopnea.  He says he feels just like he did when he was admitted to the hospital a week ago  Patient discharged from hospital on 01/06/21 after hypoxia/SOB admission.   D/c on 80 mg lasix daily, spironolactone 12.5 mg, metoprolol 75 mg, entresto 50 mg, digoxin 125 mcg, dapaglifoxain 10 mg (switched off lantus).  He was discharged on 2L [place] supplemental oxygen.  He has hodgkin's lymphoma that is chronic.   HPI     Past Medical History:  Diagnosis Date  . Arthritis    lower back  . Bipolar 1 disorder ([place])   . Cardiomyopathy, nonischemic ([place])    no CAD 10/14/20 cath, EF 20-25% by 10/11/20 echo  . CHF (congestive heart failure) ([place])   . COVID 10/2020  . Depression   . DM2 (diabetes mellitus, type 2) ([place])   . HTN (hypertension)   . Myocardial infarction [facility])     Patient Active Problem List   Diagnosis Date Noted  . Schizophrenia ([place]) 01/04/2021  . Diarrhea 01/04/2021  . Pulmonary edema   . Mediastinal adenopathy  12/18/2020  . Adjustment disorder with mixed anxiety and depressed mood 10/30/2020  . Acute on chronic systolic heart failure ([place])   . Mediastinal lymphadenopathy 10/11/2020  . Shortness of breath 10/11/2020  . Sinus tachycardia 10/11/2020  . Night sweats 10/11/2020  . DM2 (diabetes mellitus, type 2) ([place]) 10/11/2020  . Lymphoma ([place]) 10/11/2020  . Hypertension 09/25/2020  . BPH (benign prostatic hyperplasia) 09/25/2020  . Insomnia 09/25/2020    Past Surgical History:  Procedure Laterality Date  . BACK SURGERY  01/2020  . CARDIAC CATHETERIZATION  10/2020  . CHOLECYSTECTOMY    . HERNIA REPAIR    . INTERCOSTAL NERVE BLOCK Left 12/18/2020   Procedure: INTERCOSTAL NERVE BLOCK;  Surgeon: Melrose Nakayama, MD;  Location: [facility];  Service: Thoracic;  Laterality: Left;  . LYMPH NODE BIOPSY Left 12/18/2020   Procedure: LYMPH NODE BIOPSY;  Surgeon: Melrose Nakayama, MD;  Location: [facility];  Service: Thoracic;  Laterality: Left;  . RIGHT/LEFT HEART CATH AND CORONARY ANGIOGRAPHY N/A 10/14/2020   Procedure: RIGHT/LEFT HEART CATH AND CORONARY ANGIOGRAPHY;  Surgeon: Jettie Booze, MD;  Location: [facility];  Service: Cardiovascular;  Laterality: N/A;  . SPINAL FIXATION SURGERY  02/26/2020  . XI ROBOTIC ASSISTED THORASCOPY FOR BIOPSY AP WINDOW LYMPH NODES (Left)  12/18/2020  Family History  Problem Relation Age of Onset  . Hypertension Mother   . Hyperlipidemia Mother   . Stroke Mother   . Cancer Mother   . Hypertension Father   . Hyperlipidemia Father   . Healthy Sister     Social History   Tobacco Use  . Smoking status: Current Some Day Smoker    Packs/day: 0.25    Types: Cigarettes  . Smokeless tobacco: Never Used  . Tobacco comment: occasionally  Vaping Use  . Vaping Use: Never used  Substance Use Topics  . Alcohol use: Never  . Drug use: Not Currently    Types: Cocaine    Home Medications Prior to Admission medications   Medication Sig Start  Date End Date Taking? Authorizing Provider  acetaminophen (TYLENOL) 325 MG tablet Take 650 mg by mouth every 6 (six) hours as needed for moderate pain.    [provider]  atorvastatin (LIPITOR) 10 MG tablet Take 10 mg by mouth daily. 09/30/20   [provider]  blood glucose meter kit and supplies KIT Dispense based on patient and insurance preference. Use up to four times daily as directed. (FOR ICD-9 250.00, 250.01). Patient not taking: Reported on 01/04/2021 10/23/20   Barb Merino, MD  brimonidine (ALPHAGAN) 0.2 % ophthalmic solution Place 1 drop into both eyes 2 (two) times daily.     [provider]  dapagliflozin propanediol (FARXIGA) 10 MG TABS tablet Take 1 tablet (10 mg total) by mouth daily. 10/23/20   Barb Merino, MD  digoxin (LANOXIN) 0.125 MG tablet Take 1 tablet (125 mcg total) by mouth daily. 12/29/20 12/29/21  Darrick Grinder D, NP  furosemide (LASIX) 80 MG tablet Take 1 tablet (80 mg total) by mouth daily. 01/07/21   Madalyn Rob, MD  Insulin Pen Needle (PEN NEEDLES 29GX1/2") 29G X 12MM MISC 1 each by Does not apply route daily. 10/23/20   Barb Merino, MD  latanoprost (XALATAN) 0.005 % ophthalmic solution Place 1 drop into both eyes at bedtime.     [provider]  levothyroxine (SYNTHROID) 100 MCG tablet Take 100 mcg by mouth daily before breakfast.    [provider]  melatonin 3 MG TABS tablet Take 3 mg by mouth at bedtime. 09/08/20   [provider]  metoprolol succinate (TOPROL XL) 25 MG 24 hr tablet Take 3 tablets (75 mg total) by mouth daily. 01/06/21 02/05/21  Madalyn Rob, MD  polyethylene glycol (MIRALAX / GLYCOLAX) 17 g packet Take 17 g by mouth 2 (two) times daily. 10/23/20   Barb Merino, MD  risperiDONE (RISPERDAL) 0.5 MG tablet Take 1 tablet (0.5 mg total) by mouth at bedtime. 12/14/20   Lucrezia Starch, MD  sacubitril-valsartan (ENTRESTO) 24-26 MG Take 1 tablet by mouth 2 (two) times daily.    [provider]  senna (SENOKOT) 8.6 MG TABS tablet Take 2 tablets (17.2 mg total) by mouth 2 (two) times daily. 10/23/20   Barb Merino, MD  spironolactone (ALDACTONE) 25 MG tablet Take 0.5 tablets (12.5 mg total) by mouth daily. 01/07/21   Madalyn Rob, MD  tamsulosin (FLOMAX) 0.4 MG CAPS capsule Take 0.4 mg by mouth daily. 09/08/20   [provider]    Allergies    Ibuprofen and Penicillins  Review of Systems   Review of Systems  Constitutional: Negative for chills and fever.  HENT: Negative for ear pain and sore throat.   Eyes: Negative for pain and visual disturbance.  Respiratory: Positive for shortness of breath. Negative  for cough.   Cardiovascular: Positive for leg swelling. Negative for palpitations.  Gastrointestinal: Negative for abdominal pain and vomiting.  Genitourinary: Negative for dysuria and hematuria.  Musculoskeletal: Negative for arthralgias and back pain.  Skin: Negative for color change and rash.  Neurological: Negative for syncope and light-headedness.  All other systems reviewed and are negative.   Physical Exam Updated Vital Signs BP (!) 141/118   Pulse (!) 116   Temp 99.1 F (37.3 C) (Oral)   Resp (!) 38   SpO2 93%   Physical Exam Constitutional:      General: He is not in acute distress. HENT:     Head: Normocephalic and atraumatic.  Eyes:     Conjunctiva/sclera: Conjunctivae normal.     Pupils: Pupils are equal, round, and reactive to light.  Cardiovascular:     Rate and Rhythm: Normal rate and regular rhythm.  Pulmonary:     Effort: Pulmonary effort is normal. No respiratory distress.     Comments: 96% on 2L [place] (baseline) Fine crackles in mid and lower lung fields Speaking comfortably in full sentences Abdominal:     General: There is no distension.     Tenderness: There is no abdominal tenderness.  Genitourinary:    Testes:        Right: Swelling present.        Left: Swelling present.  Skin:    General: Skin is warm and dry.   Neurological:     General: No focal deficit present.     Mental Status: He is alert and oriented to person, place, and time. Mental status is at baseline.     ED Results / Procedures / Treatments   Labs (all labs ordered are listed, but only abnormal results are displayed) Labs Reviewed  CBC - Abnormal; Notable for the following components:      Result Value   WBC 10.8 (*)    RBC 5.94 (*)    MCV 78.1 (*)    MCH 25.8 (*)    RDW 18.0 (*)    All other components within normal limits  COMPREHENSIVE METABOLIC PANEL - Abnormal; Notable for the following components:   Sodium 134 (*)    CO2 20 (*)    Glucose, Bld 114 (*)    All other components within normal limits  BRAIN NATRIURETIC PEPTIDE - Abnormal; Notable for the following components:   B Natriuretic Peptide 438.6 (*)    All other components within normal limits  TROPONIN I (HIGH SENSITIVITY) - Abnormal; Notable for the following components:   Troponin I (High Sensitivity) 18 (*)    All other components within normal limits  TROPONIN I (HIGH SENSITIVITY) - Abnormal; Notable for the following components:   Troponin I (High Sensitivity) 20 (*)    All other components within normal limits  URINALYSIS, ROUTINE W REFLEX MICROSCOPIC  LIPASE, BLOOD    EKG EKG Interpretation  Date/Time:  Sunday January 11 2021 11:30:49 EST Ventricular Rate:  121 PR Interval:  174 QRS Duration: 86 QT Interval:  280 QTC Calculation: 397 R Axis:   -42 Text Interpretation: Sinus tachycardia with frequent Premature ventricular complexes Possible Left atrial enlargement Left axis deviation Septal infarct , age undetermined Abnormal ECG NO sig change from Jan 04 2021 ecg No STEMI Confirmed by Octaviano Glow [phone]) on 01/11/2021 1:47:20 PM   Radiology DG Chest 2 View  Result Date: 01/11/2021 CLINICAL DATA:  Shortness of breath EXAM: CHEST - 2 VIEW COMPARISON:  December 28, 2020 FINDINGS: The  cardiomediastinal silhouette is unchanged and enlarged in  contour.Perihilar vascular congestion. Diffuse interstitial prominence. Small bilateral pleural effusions. No pneumothorax. Patchy bibasilar airspace opacities, similar comparison to prior. Visualized abdomen is unremarkable. Mild degenerative changes of the thoracic spine. IMPRESSION: Constellation of findings are favored to reflect pulmonary edema with scattered areas of atelectasis. Atypical infection could present similarly. Electronically Signed   By: Valentino Saxon MD   On: 01/11/2021 12:51    Procedures Procedures   Medications Ordered in ED Medications  furosemide (LASIX) injection 80 mg (80 mg Intravenous Given 01/11/21 1234)    ED Course  I have reviewed the triage vital signs and the nursing notes.  Pertinent labs & imaging results that were available during my care of the patient were reviewed by me and considered in my medical decision making (see chart for details).  This patient complains of orthopnea, shortness of breath.  This involves an extensive number of treatment options, and is a complaint that carries with it a high risk of complications and morbidity.  The differential diagnosis includes CHF exacerbation vs ACS vs PNA vs anemia vs other  Most likely CHF related with his significant existing heart failure.  He may not have received his usual medications the past day or day.  We'll give 80 mg IV lasix here.   He has no new oxygen requirements on arrival.  I ordered, reviewed, and interpreted labs, which included BNP 438 (improved from last hospitalization, which was 592), trop 18 -> 20 (chronically elevated, flat today), CBC near baseline, no anemia, hgb 15.3. I ordered imaging studies which included dg chest I independently visualized and interpreted imaging which showed pulm edema and suspected atelectasis and the monitor tracing which showed sinus tachycardia.  Note sinus tachycardia was present throughout last hospitalization course. Previous records obtained and  reviewed showing recent hospitalization, echocardiogram   After the interventions stated above, I reevaluated the patient and found symptomatic improvement after diuresis.  With cardiology evaluation and echo in the hospital within the past week, I do not think there is anything further to offer the patient.  He can be discharged with original follow up plan.  I made clear on his paperwork the importance of adhering to his prescribed medication regimen at his facility.  Clinical Course as of 01/11/21 1727  Nancy Fetter Jan 11, 2021  1545 Produced 1.5 L urine, breathing more comfortably, will discharge with instructions to maintain home lasix dose [MT]    Clinical Course User Index [MT] Kashish Yglesias, Carola Rhine, MD   Final Clinical Impression(s) / ED Diagnoses Final diagnoses:  Acute on chronic congestive heart failure, unspecified heart failure type [facility])    Rx / DC Orders ED Discharge Orders    None       Wyvonnia Dusky, MD 01/11/21 1727

## 2021-01-11 NOTE — ED Triage Notes (Addendum)
Pt to triage via [facility] from group home.  C/o SOB x 2 days.  States the same aide has been working the last 2 days and he doesn't believe he is receiving his medications correctly.  Pt is suppose to take diuretic and has faint rales in bases.  92% on Room Air.  98% on 4 liters.  170/80. HR 120s.  C/o pain to L side of abd.

## 2021-01-11 NOTE — Discharge Instructions (Addendum)
Important - please read:  It is very important that Charles [name] receives the medications below as listed.  These were adjusted during his last hospitalization last week.   In the ER today we gave him IV lasix to help him pee some of the fluid out of his lungs.  He was breathing more comfortably afterwards.  STOP taking these medications       cyclobenzaprine 10 MG tablet Commonly known as: FLEXERIL    divalproex 500 MG DR tablet Commonly known as: DEPAKOTE    insulin detemir 100 UNIT/ML FlexPen Commonly known as: LEVEMIR    INVEGA SUSTENNA IM    traMADol 50 MG tablet Commonly known as: ULTRAM               TAKE these medications       acetaminophen 325 MG tablet Commonly known as: TYLENOL Take 650 mg by mouth every 6 (six) hours as needed for moderate pain.    atorvastatin 10 MG tablet Commonly known as: LIPITOR Take 10 mg by mouth daily.    blood glucose meter kit and supplies Kit Dispense based on patient and insurance preference. Use up to four times daily as directed. (FOR ICD-9 250.00, 250.01).    brimonidine 0.2 % ophthalmic solution Commonly known as: ALPHAGAN Place 1 drop into both eyes 2 (two) times daily.    dapagliflozin propanediol 10 MG Tabs tablet Commonly known as: FARXIGA Take 1 tablet (10 mg total) by mouth daily.    digoxin 0.125 MG tablet Commonly known as: Lanoxin Take 1 tablet (125 mcg total) by mouth daily.    furosemide 80 MG tablet Commonly known as: LASIX Take 1 tablet (80 mg total) by mouth daily. Start taking on: January 07, 2021 What changed:  medication strength how much to take    latanoprost 0.005 % ophthalmic solution Commonly known as: XALATAN Place 1 drop into both eyes at bedtime.    levothyroxine 100 MCG tablet Commonly known as: SYNTHROID Take 100 mcg by mouth daily before breakfast.    melatonin 3 MG Tabs tablet Take 3 mg by mouth at bedtime.    metoprolol succinate 25 MG 24 hr tablet Commonly known as: Toprol XL Take  3 tablets (75 mg total) by mouth daily. What changed:  medication strength how much to take additional instructions Another medication with the same name was removed. Continue taking this medication, and follow the directions you see here.    PEN NEEDLES 29GX1/2" 29G X 12MM Misc 1 each by Does not apply route daily.    polyethylene glycol 17 g packet Commonly known as: MIRALAX / GLYCOLAX Take 17 g by mouth 2 (two) times daily.    risperiDONE 0.5 MG tablet Commonly known as: RISPERDAL Take 1 tablet (0.5 mg total) by mouth at bedtime.    sacubitril-valsartan 24-26 MG Commonly known as: ENTRESTO Take 1 tablet by mouth 2 (two) times daily.    senna 8.6 MG Tabs tablet Commonly known as: SENOKOT Take 2 tablets (17.2 mg total) by mouth 2 (two) times daily.    spironolactone 25 MG tablet Commonly known as: Aldactone Take 0.5 tablets (12.5 mg total) by mouth daily. Start taking on: January 07, 2021    tamsulosin 0.4 MG Caps capsule Commonly known as: FLOMAX Take 0.4 mg by mouth daily.

## 2021-01-11 NOTE — ED Notes (Signed)
RN, Ophelia Charter, aware that pt is tachypneic.

## 2021-01-11 NOTE — ED Notes (Signed)
Dc instructions reviewed with pt. PT verbalized understanding. PT DC 

## 2021-01-11 NOTE — ED Notes (Signed)
Pt stays at the [facility].

## 2021-01-11 NOTE — ED Notes (Signed)
attemtped X2 to contact group home without success. Social Work notified.

## 2021-01-12 ENCOUNTER — Other Ambulatory Visit: Payer: Self-pay | Admitting: Thoracic Surgery (Cardiothoracic Vascular Surgery)

## 2021-01-12 ENCOUNTER — Other Ambulatory Visit: Payer: Self-pay

## 2021-01-12 ENCOUNTER — Ambulatory Visit (INDEPENDENT_AMBULATORY_CARE_PROVIDER_SITE_OTHER): Payer: Medicaid Other | Admitting: Student

## 2021-01-12 ENCOUNTER — Ambulatory Visit (HOSPITAL_COMMUNITY)
Admission: RE | Admit: 2021-01-12 | Discharge: 2021-01-12 | Disposition: A | Discharge status: Home / Self Care | Payer: Medicaid Other | Source: Ambulatory Visit | Attending: Internal Medicine | Admitting: Internal Medicine

## 2021-01-12 VITALS — BP 119/85 | HR 129 | Temp 98.4°F | Wt 246.8 lb

## 2021-01-12 DIAGNOSIS — E119 Type 2 diabetes mellitus without complications: Secondary | ICD-10-CM | POA: Diagnosis not present

## 2021-01-12 DIAGNOSIS — I5020 Unspecified systolic (congestive) heart failure: Secondary | ICD-10-CM | POA: Insufficient documentation

## 2021-01-12 DIAGNOSIS — I5043 Acute on chronic combined systolic (congestive) and diastolic (congestive) heart failure: Secondary | ICD-10-CM | POA: Insufficient documentation

## 2021-01-12 DIAGNOSIS — C8100 Nodular lymphocyte predominant Hodgkin lymphoma, unspecified site: Secondary | ICD-10-CM

## 2021-01-12 DIAGNOSIS — R590 Localized enlarged lymph nodes: Secondary | ICD-10-CM

## 2021-01-12 DIAGNOSIS — I10 Essential (primary) hypertension: Secondary | ICD-10-CM | POA: Diagnosis not present

## 2021-01-12 DIAGNOSIS — R000 Tachycardia, unspecified: Secondary | ICD-10-CM | POA: Diagnosis not present

## 2021-01-12 LAB — GLUCOSE, CAPILLARY: Glucose-Capillary: 133 mg/dL — ABNORMAL HIGH (ref 70–99)

## 2021-01-12 NOTE — Progress Notes (Signed)
SATURATION QUALIFICATIONS: (This note is used to comply with regulatory documentation for home oxygen)  Patient Saturations on Room Air at Rest = 96%  Pulse 124  Patient Saturations on Room Air while Ambulating = 94-95% Pulse 138 Pt was only able to ambulate a short distance before stopping to sit/rest

## 2021-01-12 NOTE — Assessment & Plan Note (Addendum)
Patient presents for hospital follow-up after recent hospitalization 01/04/21-01/06/21 for acute on chronic heart failure with reduced ejection fraction.   Presented to the ED with symptoms consistent with an acute heart failure exacerbation and elevated BNP. Echo on 01/04/21 demonstrated stable EF 97-58%, grade 3 diastolic dysfunction with no evidence of cardiac tamponade. Cardiology was consulted. He was treated with IV diuresis and continued on goal-directed medical therapy with improvement in his symptoms and exam. Discharged back to his facility on 2 L [place], 80 mg lasix daily, spironolactone 12.5 mg, metoprolol 75 mg, entresto 50 mg, digoxin 125 mcg, dapaglifoxain 10 mg (switched off of insulin).  Patient reports he was doing relatively well for the first few days after discharge however later developed progressive dyspnea and orthopnea which led him to present to the ED yesterday (01/11/21) where he was found to have BNP 438 (improved from last hospitalization, which was 592), trop 18 -> 20, CBC unremarkable, sCr 1.14 (his baseline) with K 4.2. CXR suggestive of pulmonary edema. No new oxygen requirement. He was given  80 mg IV lasix and discharged back to his facility.  Today, patient reports he is feeling tired. States he has mostly been using oxygen at night however over the last couple of days has felt he needed it during the day, especially with exertion. States he felt he should have been admitted to the hospital yesterday. He does not know the medications he is taking. Reports he is given his medications at the facility and has been taking them as directed.   In clinic today, weight is 246 lb (111.9 kg), down from discharge weight of 113.3 kg. SpO2 of 96% with P 124 on room air at rest -> SpO2 of 94-95% with P 138 on room air while ambulating. Of note, patient was only able to ambulate a short distance before stopping to sit/rest. On exam, he does not appear volume overloaded. Given his tachycardia,  EKG was obtained which showed sinus tachycardia.   A/P: Charles [name] reports progressive dyspnea for the last couple of days since being discharged almost a week ago for hospitalization for acute on chronic HFrEF. His exam is reassuringly normal, with no JVD, clear lungs, no lower extremity edema. Ambulation with pulse ox suggests he does not require supplemental oxygen, though due to his limited tolerance of ambulation, will have him continue as needed and reevaluate when he follows up at the end of the week. He denies chest pain or palpitations. Wonder if his tachycardia is exacerbated by overdiuresis.  - Will not repeat BMP as he had CMP in ED yesterday evening - Continue goal-directed medical therapy and diuretic dosing: 80 mg lasix daily, spironolactone 12.5 mg, metoprolol 75 mg, entresto 50 mg, digoxin 125 mcg, dapaglifoxain 10 mg - Instructed patient to continue his medications and write/obtain a list for his follow-up appointment in 3 days - Follow-up in 3 days on 01/15/21 for reevaluation of need for home oxygen. Bring recorded weights to appointment. - Instructed him to call cardiology to schedule follow-up (Dr. [name])

## 2021-01-12 NOTE — Assessment & Plan Note (Signed)
During his recent hospitalization, Charles [name] missed an appointment with CT surgery. This has been rescheduled to 01/13/21. He also has an appointment to establish with radiation oncology on 01/21/21.  Plan: - continue follow-up with heme onc (Dr. Lorenso Courier) - patient reminded of 01/13/21 appointment with CT surgery - reminded of 01/21/21 appointment with radiation oncology

## 2021-01-12 NOTE — Patient Instructions (Addendum)
Charles [name],   Thank you for your visit to the [facility] today. It was a pleasure meeting you. Today we discussed the following:  1) Hospital follow-up / heart failure - Your weight today was 246 lb, which is less than your hospital discharge weight of 113.3 kg (250 lb) - Continue taking your medications as prescribed during your recent hospital stay. I recommend you write or print a list of your medications so that you know what you are being given every day at your facility. Bring this list with you to your next appointment. - We assessed your need for home oxygen, and I recommend you continue to use as needed. We will reevaluate at your next appointment. - It is important that you limit your salt intake. Salt causes the body to retain fluid. - Weight yourself every day if possible and bring this record to your next appointment. - Please call cardiology (the heart doctor) to schedule follow-up. The number to call is [phone] (Dr. [name]).  2) Hodgkin's lymphoma - It is very important that you keep your appointments with CT surgery (01/13/21 at 4:30 pm with Dr. Roxan Hockey) and radiation oncology (01/21/21 at 9:30 am with Dr. Isidore Moos).    We would like you to schedule to be seen in our clinic this Thursday. Please bring all of your medications and/or a list of your medications with you.   If you have any questions or concerns, please call our clinic at [phone] between 9am-5pm. Outside of these hours, call [phone] and ask for the internal medicine resident on call. If you feel you are having a medical emergency please call 911.

## 2021-01-12 NOTE — Assessment & Plan Note (Signed)
BP 119/85 today. Patient denies headache, dizziness, blurry vision.   Plan: - Continue medications as listed in HFrEF A&P

## 2021-01-12 NOTE — Progress Notes (Signed)
   CC: hospital follow-up  HPI:  Charles [name] is a 55 y.o. man with history as below who presents to clinic for follow-up from hospitalization 01/04/21-01/06/21 for acute on chronic heart failure. He was also seen yesterday evening in the ED for progressive dyspnea suspected to be due to his heart failure.    To see the details of this patient's management of their acute and chronic problems, please refer to the Assessment & Plan under the Encounters tab.    Past Medical History:  Diagnosis Date  . Arthritis    lower back  . Bipolar 1 disorder ([place])   . Cardiomyopathy, nonischemic ([place])    no CAD 10/14/20 cath, EF 20-25% by 10/11/20 echo  . CHF (congestive heart failure) ([place])   . COVID 10/2020  . Depression   . DM2 (diabetes mellitus, type 2) ([place])   . HTN (hypertension)   . Myocardial infarction [facility])    Review of Systems:    Review of Systems  Constitutional: Negative for chills and fever.  Eyes: Negative for blurred vision.  Respiratory: Positive for shortness of breath. Negative for cough.   Cardiovascular: Positive for orthopnea. Negative for chest pain, palpitations, leg swelling and PND.  Gastrointestinal: Negative for abdominal pain.  Musculoskeletal: Negative for myalgias.  Neurological: Negative for dizziness, weakness and headaches.  Psychiatric/Behavioral: Negative for depression.    Physical Exam:  Vitals:   01/12/21 1041  BP: 119/85  Pulse: (!) 129  SpO2: 99%  Weight: 246 lb 12.8 oz (111.9 kg)   Constitutional: well-developed well-nourished man sitting in chair, in no acute distress HENT: normocephalic atraumatic, mucous membranes slightly dry Eyes: conjunctiva non-erythematous Neck: supple, no JVD appreciated Cardiovascular: tachycardic, regular rhythm, no m/r/g Pulmonary/Chest: normal work of breathing on 2L  with intermittent episodes of tachypnea, speaks in full sentences, lungs clear to auscultation bilaterally; trace lower extremity edema  bilaterally Abdominal: soft, non-tender, non-distended MSK: normal bulk and tone Neurological: alert & oriented x 3 Skin: warm and dry, normal skin turgor Psych: normal mood and affect    Assessment & Plan:   See Encounters Tab for problem based charting.  Patient discussed with Dr. Dareen Piano

## 2021-01-12 NOTE — Assessment & Plan Note (Signed)
Prior to his recent hospitalization, patient was taking Lantus 25 units daily. Hemoglobin A1c 7.4% on 01/05/21, down from 8.1% in 10/2020. During the hospitalization he was starteddapagliflozin 10mg  daily and did not require any additional sliding scale insulin. He was discharged home on dapagliflozin only.   Today, POC glucose 133. He reports his blood sugars are in the 100s when he checks at home. Denies polyuria, polydipsia, blurry vision, abdominal pain. Denies symptomatic or asymptomatic lows.  Plan: - Continue dapagliflozin 10 mg daily - Repeat hemoglobin A1c ~04/04/21

## 2021-01-13 ENCOUNTER — Ambulatory Visit (INDEPENDENT_AMBULATORY_CARE_PROVIDER_SITE_OTHER): Payer: Self-pay | Admitting: Thoracic Surgery (Cardiothoracic Vascular Surgery)

## 2021-01-13 ENCOUNTER — Other Ambulatory Visit: Payer: Self-pay

## 2021-01-13 ENCOUNTER — Inpatient Hospital Stay (HOSPITAL_COMMUNITY)
Admission: EM | Admit: 2021-01-13 | Discharge: 2021-01-16 | DRG: 314 | Disposition: A | Discharge status: Nursing Home (Includes ICF) | Payer: Medicaid Other | Source: Ambulatory Visit | Attending: Student in an Organized Health Care Education/Training Program | Admitting: Student in an Organized Health Care Education/Training Program

## 2021-01-13 ENCOUNTER — Encounter: Payer: Self-pay | Admitting: Thoracic Surgery (Cardiothoracic Vascular Surgery)

## 2021-01-13 ENCOUNTER — Ambulatory Visit
Admission: RE | Admit: 2021-01-13 | Discharge: 2021-01-13 | Disposition: A | Discharge status: Home / Self Care | Payer: Medicaid Other | Source: Ambulatory Visit | Attending: Thoracic Surgery (Cardiothoracic Vascular Surgery) | Admitting: Thoracic Surgery (Cardiothoracic Vascular Surgery)

## 2021-01-13 ENCOUNTER — Emergency Department (HOSPITAL_COMMUNITY): Payer: Medicaid Other

## 2021-01-13 VITALS — BP 100/70 | HR 116 | Temp 97.9°F | Resp 20 | Ht 69.0 in | Wt 246.0 lb

## 2021-01-13 DIAGNOSIS — J9601 Acute respiratory failure with hypoxia: Secondary | ICD-10-CM | POA: Diagnosis present

## 2021-01-13 DIAGNOSIS — Z8616 Personal history of COVID-19: Secondary | ICD-10-CM

## 2021-01-13 DIAGNOSIS — R000 Tachycardia, unspecified: Secondary | ICD-10-CM | POA: Diagnosis present

## 2021-01-13 DIAGNOSIS — Z79899 Other long term (current) drug therapy: Secondary | ICD-10-CM

## 2021-01-13 DIAGNOSIS — I5043 Acute on chronic combined systolic (congestive) and diastolic (congestive) heart failure: Secondary | ICD-10-CM | POA: Diagnosis not present

## 2021-01-13 DIAGNOSIS — Z79890 Hormone replacement therapy (postmenopausal): Secondary | ICD-10-CM | POA: Diagnosis not present

## 2021-01-13 DIAGNOSIS — I110 Hypertensive heart disease with heart failure: Secondary | ICD-10-CM | POA: Diagnosis present

## 2021-01-13 DIAGNOSIS — E039 Hypothyroidism, unspecified: Secondary | ICD-10-CM | POA: Diagnosis present

## 2021-01-13 DIAGNOSIS — E669 Obesity, unspecified: Secondary | ICD-10-CM | POA: Diagnosis present

## 2021-01-13 DIAGNOSIS — C8190 Hodgkin lymphoma, unspecified, unspecified site: Secondary | ICD-10-CM | POA: Diagnosis present

## 2021-01-13 DIAGNOSIS — Z880 Allergy status to penicillin: Secondary | ICD-10-CM | POA: Diagnosis not present

## 2021-01-13 DIAGNOSIS — F200 Paranoid schizophrenia: Secondary | ICD-10-CM | POA: Diagnosis present

## 2021-01-13 DIAGNOSIS — F4323 Adjustment disorder with mixed anxiety and depressed mood: Secondary | ICD-10-CM | POA: Diagnosis present

## 2021-01-13 DIAGNOSIS — F319 Bipolar disorder, unspecified: Secondary | ICD-10-CM | POA: Diagnosis present

## 2021-01-13 DIAGNOSIS — I2729 Other secondary pulmonary hypertension: Secondary | ICD-10-CM | POA: Diagnosis present

## 2021-01-13 DIAGNOSIS — R590 Localized enlarged lymph nodes: Secondary | ICD-10-CM

## 2021-01-13 DIAGNOSIS — Z794 Long term (current) use of insulin: Secondary | ICD-10-CM

## 2021-01-13 DIAGNOSIS — Z83438 Family history of other disorder of lipoprotein metabolism and other lipidemia: Secondary | ICD-10-CM | POA: Diagnosis not present

## 2021-01-13 DIAGNOSIS — Z8249 Family history of ischemic heart disease and other diseases of the circulatory system: Secondary | ICD-10-CM | POA: Diagnosis not present

## 2021-01-13 DIAGNOSIS — K5900 Constipation, unspecified: Secondary | ICD-10-CM | POA: Diagnosis not present

## 2021-01-13 DIAGNOSIS — E785 Hyperlipidemia, unspecified: Secondary | ICD-10-CM | POA: Diagnosis present

## 2021-01-13 DIAGNOSIS — Z886 Allergy status to analgesic agent status: Secondary | ICD-10-CM | POA: Diagnosis not present

## 2021-01-13 DIAGNOSIS — R140 Abdominal distension (gaseous): Secondary | ICD-10-CM | POA: Diagnosis present

## 2021-01-13 DIAGNOSIS — I252 Old myocardial infarction: Secondary | ICD-10-CM | POA: Diagnosis not present

## 2021-01-13 DIAGNOSIS — Z823 Family history of stroke: Secondary | ICD-10-CM | POA: Diagnosis not present

## 2021-01-13 DIAGNOSIS — I509 Heart failure, unspecified: Secondary | ICD-10-CM

## 2021-01-13 DIAGNOSIS — M1990 Unspecified osteoarthritis, unspecified site: Secondary | ICD-10-CM | POA: Diagnosis present

## 2021-01-13 DIAGNOSIS — Z6836 Body mass index (BMI) 36.0-36.9, adult: Secondary | ICD-10-CM

## 2021-01-13 DIAGNOSIS — I5023 Acute on chronic systolic (congestive) heart failure: Secondary | ICD-10-CM | POA: Diagnosis present

## 2021-01-13 DIAGNOSIS — Z8571 Personal history of Hodgkin lymphoma: Secondary | ICD-10-CM

## 2021-01-13 DIAGNOSIS — R110 Nausea: Secondary | ICD-10-CM | POA: Diagnosis present

## 2021-01-13 DIAGNOSIS — F17210 Nicotine dependence, cigarettes, uncomplicated: Secondary | ICD-10-CM | POA: Diagnosis present

## 2021-01-13 DIAGNOSIS — I428 Other cardiomyopathies: Principal | ICD-10-CM | POA: Diagnosis present

## 2021-01-13 DIAGNOSIS — E119 Type 2 diabetes mellitus without complications: Secondary | ICD-10-CM

## 2021-01-13 DIAGNOSIS — C8102 Nodular lymphocyte predominant Hodgkin lymphoma, intrathoracic lymph nodes: Secondary | ICD-10-CM | POA: Diagnosis present

## 2021-01-13 LAB — URINALYSIS, ROUTINE W REFLEX MICROSCOPIC
Bilirubin Urine: NEGATIVE
Glucose, UA: NEGATIVE mg/dL
Hgb urine dipstick: NEGATIVE
Ketones, ur: NEGATIVE mg/dL
Leukocytes,Ua: NEGATIVE
Nitrite: NEGATIVE
Protein, ur: NEGATIVE mg/dL
Specific Gravity, Urine: 1.013 (ref 1.005–1.030)
pH: 5 (ref 5.0–8.0)

## 2021-01-13 LAB — BASIC METABOLIC PANEL
Anion gap: 9 (ref 5–15)
BUN: 13 mg/dL (ref 6–20)
CO2: 20 mmol/L — ABNORMAL LOW (ref 22–32)
Calcium: 9.5 mg/dL (ref 8.9–10.3)
Chloride: 105 mmol/L (ref 98–111)
Creatinine, Ser: 1.14 mg/dL (ref 0.61–1.24)
GFR, Estimated: >60 mL/min (ref >60)
Glucose, Bld: 120 mg/dL — ABNORMAL HIGH (ref 70–99)
Potassium: 4.3 mmol/L (ref 3.5–5.1)
Sodium: 134 mmol/L — ABNORMAL LOW (ref 135–145)

## 2021-01-13 LAB — CBC
HCT: 46.8 % (ref 39.0–52.0)
Hemoglobin: 15 g/dL (ref 13.0–17.0)
MCH: 25.2 pg — ABNORMAL LOW (ref 26.0–34.0)
MCHC: 32.1 g/dL (ref 30.0–36.0)
MCV: 78.7 fL — ABNORMAL LOW (ref 80.0–100.0)
Platelets: 223 10*3/uL (ref 150–400)
RBC: 5.95 MIL/uL — ABNORMAL HIGH (ref 4.22–5.81)
RDW: 18.2 % — ABNORMAL HIGH (ref 11.5–15.5)
WBC: 12.4 10*3/uL — ABNORMAL HIGH (ref 4.0–10.5)
nRBC: 0 % (ref 0.0–0.2)

## 2021-01-13 LAB — CBG MONITORING, ED: Glucose-Capillary: 119 mg/dL — ABNORMAL HIGH (ref 70–99)

## 2021-01-13 LAB — DIGOXIN LEVEL: Digoxin Level: 0.2 ng/mL — ABNORMAL LOW (ref 0.8–2.0)

## 2021-01-13 LAB — BRAIN NATRIURETIC PEPTIDE: B Natriuretic Peptide: 433.1 pg/mL — ABNORMAL HIGH (ref 0.0–100.0)

## 2021-01-13 LAB — TROPONIN I (HIGH SENSITIVITY)
Troponin I (High Sensitivity): 16 ng/L (ref <18)
Troponin I (High Sensitivity): 16 ng/L (ref <18)

## 2021-01-13 MED ORDER — LATANOPROST 0.005 % OP SOLN
1.0000 [drp] | Freq: Every day | OPHTHALMIC | Status: DC
  Administered 2021-01-15 (×2): 1 [drp] via OPHTHALMIC
  Filled 2021-01-13: qty 2.5

## 2021-01-13 MED ORDER — ONDANSETRON HCL 4 MG/2ML IJ SOLN: 4.0000 mg | Freq: Four times a day (QID) | INTRAMUSCULAR | Status: DC | PRN

## 2021-01-13 MED ORDER — SACUBITRIL-VALSARTAN 24-26 MG PO TABS
1.0000 | ORAL_TABLET | Freq: Two times a day (BID) | ORAL | Status: DC
  Administered 2021-01-14 – 2021-01-16 (×6): 1 via ORAL
  Filled 2021-01-13 (×8): qty 1

## 2021-01-13 MED ORDER — METOPROLOL SUCCINATE ER 50 MG PO TB24
75.0000 mg | ORAL_TABLET | Freq: Every day | ORAL | Status: DC
  Administered 2021-01-14 – 2021-01-16 (×3): 75 mg via ORAL
  Filled 2021-01-13 (×3): qty 1

## 2021-01-13 MED ORDER — BRIMONIDINE TARTRATE 0.2 % OP SOLN
1.0000 [drp] | Freq: Two times a day (BID) | OPHTHALMIC | Status: DC
  Administered 2021-01-14 – 2021-01-16 (×5): 1 [drp] via OPHTHALMIC
  Filled 2021-01-13: qty 5

## 2021-01-13 MED ORDER — ATORVASTATIN CALCIUM 10 MG PO TABS
10.0000 mg | ORAL_TABLET | Freq: Every day | ORAL | Status: DC
  Administered 2021-01-14 – 2021-01-16 (×3): 10 mg via ORAL
  Filled 2021-01-13 (×3): qty 1

## 2021-01-13 MED ORDER — FUROSEMIDE 10 MG/ML IJ SOLN
80.0000 mg | Freq: Once | INTRAMUSCULAR | Status: AC
  Administered 2021-01-13: 80 mg via INTRAVENOUS
  Filled 2021-01-13: qty 8

## 2021-01-13 MED ORDER — IOHEXOL 350 MG/ML SOLN
100.0000 mL | Freq: Once | INTRAVENOUS | Status: AC | PRN
  Administered 2021-01-13: 76 mL via INTRAVENOUS

## 2021-01-13 MED ORDER — RISPERIDONE 0.5 MG PO TABS
0.5000 mg | ORAL_TABLET | Freq: Every day | ORAL | Status: DC
  Administered 2021-01-14 – 2021-01-15 (×3): 0.5 mg via ORAL
  Filled 2021-01-13 (×4): qty 1

## 2021-01-13 MED ORDER — DIGOXIN 125 MCG PO TABS
125.0000 ug | ORAL_TABLET | Freq: Every day | ORAL | Status: DC
  Administered 2021-01-14 – 2021-01-16 (×3): 125 ug via ORAL
  Filled 2021-01-13 (×3): qty 1

## 2021-01-13 MED ORDER — DAPAGLIFLOZIN PROPANEDIOL 10 MG PO TABS
10.0000 mg | ORAL_TABLET | Freq: Every day | ORAL | Status: DC
  Administered 2021-01-14 – 2021-01-16 (×3): 10 mg via ORAL
  Filled 2021-01-13 (×3): qty 1

## 2021-01-13 MED ORDER — ACETAMINOPHEN 325 MG PO TABS
650.0000 mg | ORAL_TABLET | Freq: Four times a day (QID) | ORAL | Status: DC | PRN
  Administered 2021-01-14: 650 mg via ORAL
  Filled 2021-01-13: qty 2

## 2021-01-13 MED ORDER — ENOXAPARIN SODIUM 40 MG/0.4ML ~~LOC~~ SOLN
40.0000 mg | SUBCUTANEOUS | Status: DC
  Administered 2021-01-14 – 2021-01-15 (×3): 40 mg via SUBCUTANEOUS
  Filled 2021-01-13 (×3): qty 0.4

## 2021-01-13 MED ORDER — LEVOTHYROXINE SODIUM 100 MCG PO TABS
100.0000 ug | ORAL_TABLET | Freq: Every day | ORAL | Status: DC
  Administered 2021-01-14 – 2021-01-16 (×3): 100 ug via ORAL
  Filled 2021-01-13 (×3): qty 1

## 2021-01-13 MED ORDER — TAMSULOSIN HCL 0.4 MG PO CAPS
0.4000 mg | ORAL_CAPSULE | Freq: Every day | ORAL | Status: DC
  Administered 2021-01-14 – 2021-01-16 (×3): 0.4 mg via ORAL
  Filled 2021-01-13 (×3): qty 1

## 2021-01-13 MED ORDER — SPIRONOLACTONE 12.5 MG HALF TABLET
12.5000 mg | ORAL_TABLET | Freq: Every day | ORAL | Status: DC
  Administered 2021-01-14 – 2021-01-16 (×3): 12.5 mg via ORAL
  Filled 2021-01-13 (×3): qty 1

## 2021-01-13 MED ORDER — INSULIN ASPART 100 UNIT/ML ~~LOC~~ SOLN
0.0000 [IU] | Freq: Three times a day (TID) | SUBCUTANEOUS | Status: DC
  Administered 2021-01-14: 3 [IU] via SUBCUTANEOUS
  Administered 2021-01-14: 2 [IU] via SUBCUTANEOUS
  Administered 2021-01-15: 3 [IU] via SUBCUTANEOUS
  Administered 2021-01-15 – 2021-01-16 (×2): 2 [IU] via SUBCUTANEOUS
  Administered 2021-01-16: 3 [IU] via SUBCUTANEOUS

## 2021-01-13 MED ORDER — ACETAMINOPHEN 650 MG RE SUPP: 650.0000 mg | Freq: Four times a day (QID) | RECTAL | Status: DC | PRN

## 2021-01-13 MED ORDER — ONDANSETRON HCL 4 MG PO TABS: 4.0000 mg | ORAL_TABLET | Freq: Four times a day (QID) | ORAL | Status: DC | PRN

## 2021-01-13 MED ORDER — MELATONIN 3 MG PO TABS
3.0000 mg | ORAL_TABLET | Freq: Every day | ORAL | Status: DC
  Administered 2021-01-14 – 2021-01-15 (×3): 3 mg via ORAL
  Filled 2021-01-13 (×3): qty 1

## 2021-01-13 NOTE — H&P (Addendum)
Date: 01/14/2021               Patient Name:  Charles [name] MRN: [id number]  DOB: Jan 12, 1966 Age / Sex: 55 y.o., male   PCP: Patient, No Pcp Per         Medical Service: Internal Medicine Teaching Service         Attending Physician: Dr. Evette Doffing, Mallie Mussel, *    First Contact: Dr. Wynetta Emery Pager: [phone]  Second Contact: Dr. Court Joy Pager: [phone]       After Hours (After 5p/  First Contact Pager: [phone]  weekends / holidays): Second Contact Pager: [phone]   Chief Complaint: Shortness of breath  History of Present Illness:  Charles [name] 55 year old man with past medical history of HFrEF, type 2 diabetes, hypertension, hyperlipidemia, paranoid schizophrenia, Hodgkin's lymphoma, who presents for worsening dyspnea for the past 2 days. States he feels his heart is beating real fast and any time he moves he has shortness of breath. States before he can walk short distances and then needs to sit down. Dyspnea also worsens when talking. States he normally uses 2 L [place] at night. Wore it yesterday during clinic visit due to his symptoms. But was unable to use it last night or this morning due to his batteries running out. He endorses abdominal distension with some leg swelling but not worsening. States since December his heart rate has been elevated. He feels like it has been faster over the past few days. Cough started back today. Feels similar to the cough that started in December.  Also endorses pain in the left side of his stomach with bloating and nausea. Last BM this morning x2. Reports loose stools says this has been going on for a few weeks. He did not have during last admission to and started again in the 2 days.   He has been taking all of his medications however last night he could not find his lasix and did not take it last night, also missed his morning medications. He is not urinating too much. When he first started taking lasix he had an increase in his urination but not much now.  Denies fevers, chills, headaches, chest pain, orthopnea, vomiting, syncope, weakness, numbness, or dizziness.   Meds:   No current facility-administered medications on file prior to encounter.   Current Outpatient Medications on File Prior to Encounter  Medication Sig Dispense Refill  . acetaminophen (TYLENOL) 325 MG tablet Take 650 mg by mouth every 6 (six) hours as needed for moderate pain.    Marland Kitchen atorvastatin (LIPITOR) 10 MG tablet Take 10 mg by mouth daily.    . blood glucose meter kit and supplies KIT Dispense based on patient and insurance preference. Use up to four times daily as directed. (FOR ICD-9 250.00, 250.01). 1 each 0  . brimonidine (ALPHAGAN) 0.2 % ophthalmic solution Place 1 drop into both eyes 2 (two) times daily.     . dapagliflozin propanediol (FARXIGA) 10 MG TABS tablet Take 1 tablet (10 mg total) by mouth daily. 30 tablet 3  . digoxin (LANOXIN) 0.125 MG tablet Take 1 tablet (125 mcg total) by mouth daily. 30 tablet 11  . furosemide (LASIX) 80 MG tablet Take 1 tablet (80 mg total) by mouth daily. 30 tablet 0  . Insulin Pen Needle (PEN NEEDLES 29GX1/2") 29G X 12MM MISC 1 each by Does not apply route daily. 100 each 0  . latanoprost (XALATAN) 0.005 % ophthalmic solution Place 1  drop into both eyes at bedtime.     Marland Kitchen levothyroxine (SYNTHROID) 100 MCG tablet Take 100 mcg by mouth daily before breakfast.    . melatonin 3 MG TABS tablet Take 3 mg by mouth at bedtime.    . metoprolol succinate (TOPROL XL) 25 MG 24 hr tablet Take 3 tablets (75 mg total) by mouth daily. 90 tablet 0  . polyethylene glycol (MIRALAX / GLYCOLAX) 17 g packet Take 17 g by mouth 2 (two) times daily. 14 each 0  . risperiDONE (RISPERDAL) 0.5 MG tablet Take 1 tablet (0.5 mg total) by mouth at bedtime. 30 tablet 0  . sacubitril-valsartan (ENTRESTO) 24-26 MG Take 1 tablet by mouth 2 (two) times daily.    Marland Kitchen senna (SENOKOT) 8.6 MG TABS tablet Take 2 tablets (17.2 mg total) by mouth 2 (two) times daily. 120 tablet 0   . spironolactone (ALDACTONE) 25 MG tablet Take 0.5 tablets (12.5 mg total) by mouth daily. 30 tablet 0  . tamsulosin (FLOMAX) 0.4 MG CAPS capsule Take 0.4 mg by mouth daily.       Allergies: Allergies as of 01/13/2021 - Review Complete 01/13/2021  Allergen Reaction Noted  . Ibuprofen  11/09/2011  . Penicillins Rash 11/09/2011   Past Medical History:  Diagnosis Date  . Arthritis    lower back  . Bipolar 1 disorder ([place])   . Cardiomyopathy, nonischemic ([place])    no CAD 10/14/20 cath, EF 20-25% by 10/11/20 echo  . CHF (congestive heart failure) ([place])   . COVID 10/2020  . Depression   . DM2 (diabetes mellitus, type 2) ([place])   . HTN (hypertension)   . Myocardial infarction [facility])     Family History:  Family History  Problem Relation Age of Onset  . Hypertension Mother   . Hyperlipidemia Mother   . Stroke Mother   . Cancer Mother   . Hypertension Father   . Hyperlipidemia Father   . Healthy Sister     Social History: Lives at an assisted living. Smokes 3-4 cigarettes a week.   Review of Systems: A complete ROS was negative except as per HPI.   Physical Exam: Blood pressure 104/82, pulse 98, temperature 97.8 F (36.6 C), temperature source Axillary, resp. rate 20, height 5' 9"  (1.753 m), weight 111.6 kg, SpO2 97 %. Physical Exam Constitutional:      General: He is not in acute distress.    Appearance: He is well-developed. He is obese.  Cardiovascular:     Rate and Rhythm: Regular rhythm. Tachycardia present.     Heart sounds: No murmur heard. No friction rub. No gallop.   Pulmonary:     Effort: Tachypnea present.     Breath sounds: No wheezing, rhonchi or rales.  Abdominal:     General: Bowel sounds are normal.     Palpations: There is no mass.     Tenderness: There is no guarding or rebound.     Comments: Distended, mild left periumbilical tenderness  Musculoskeletal:     Cervical back: Normal range of motion and neck supple.     Right lower leg: Edema (1+)  present.     Left lower leg: Edema (1+) present.  Skin:    General: Skin is warm and dry.  Neurological:     General: No focal deficit present.     Mental Status: He is alert and oriented to person, place, and time.  Psychiatric:        Mood and Affect: Mood normal.  Behavior: Behavior normal.    EKG: personally reviewed my interpretation is sinus tachycardia with HR of 122, right axis deviation  DG Chest 2 View  Result Date: 01/13/2021 IMPRESSION: Cardiomegaly with mild vascular congestion and probable mild interstitial edema, improved from prior. Electronically Signed   By: Davina Poke D.O.   On: 01/13/2021 16:23   CT Angio Chest PE W and/or Wo Contrast  Result Date: 01/13/2021 IMPRESSION: No evidence of pulmonary emboli. Stable changes of pulmonary edema similar to that noted on the prior exam. Small left pleural effusion is seen. Resolution of previously seen right upper lobe airspace opacity consistent with a postinflammatory nature on the prior exam. Stable hilar and mediastinal adenopathy unchanged from the prior study. These changes are stable dating back to September of 2021 and likely of a reactive nature. Continued follow-up in 3-6 months to assess for stability is recommended. Electronically Signed   By: Inez Catalina M.D.   On: 01/13/2021 19:39    Assessment & Plan by Problem: Principal Problem:   Acute hypoxemic respiratory failure (HCC) Active Problems:   Sinus tachycardia   Acute on chronic heart failure with reduced ejection fraction and diastolic dysfunction (HCC)  Acute on chronic heart failure exacerbation Acute hypoxic respiratory failure Patient presents with increased dyspnea and palpitations for the past 2 days. Diagnosed with nonischemic cardiomyopathy in December 2022. Echo on 01/04/2021 with EF of 69-48%, grade 3 diastolic dysfunction. Recently admitted between 01/05/2021 and 01/06/2021 for heart failure exacerbation. Treated with IV lasix 120 mg with  improvement discharge home with 2L [place].  Presented to ED 2 days ago for similar symptoms and received IV lasix prior to discharge back to facility. He followed up in clinic yesterday with continued symptoms, appeared euvolemic on exam and continued on medications with infusion to follow up on 3/10 for increasing O2 need.   On exam today patient tachycardic to 120s. On 3L O2.  Increased respirations in high 20s worsened to 40 with movement but O2 saturations remained stable in mid 90s. Weight is down to 111.6 kg compared to 113.3 prior discharge. No JVD, abdomen is distended, 1+ pitting edema bilaterally. EKG with sinus tachycardia, troponin 16x2. BNP 433 improved from 3 days ago. CT angio negative for PE, stable pulmonary edema, small left plural effusion. No clear etiology for his current exacerbation, lasix yesterday. Received 80 mg IV lasix in ED. - Cardiology consulted appreciate recommendations  - Continue Lasix 80 mg IV - Continue Entresto 24-34m twice daily, spironolactone 12.556mdaily,  metoprolol 7552maily, digoxin 125m78maily, and dapagliflozin 10mg56mly - Strict ins and outs - Daily weights - May benefit from ICD - monitor BMP and mg  Sinus tachycardia Patient with persistently tachycardia since last admission. HR in 120s on exam today. EKG with sinus tachycardia and right axis deviation without change from prior. Troponin flat.  During last admission ivabradine considered once HF stabalized. During clinic appointment yesterday noted concern for tachycardia due to over diuresis.  - continue tele  Hodgkin'slymphoma Mediastinal biopsy with stage Ia Hodgkin's disease, not currently on treatment. Followed up with CT surgery today. Plan establish with radiation oncology on 01/21/2021 for possible radiation therapy.  - Follow up with radiation oncology   Type 2 DM Hemoglobin A1c of 7.4 on 01/05/2021. On  dapagliflozin 10mg 87my. Taken of levemir during last admission as he did not  require any sliding scale. - Continue dapagliflozin  - SSI - CBG monitoring  Hypothyroidism -Continue levothyroxine100 mcg daily  Hyperlipidemia -Continue  Lipitor 10 mg daily  Paranoid schizophrenia, -Continuehomerisperidone  Dispo: Admit patient to Inpatient with expected length of stay greater than 2 midnights.  Signed: Iona Beard, MD 01/14/2021, 7:27 AM  Pager: [phone] After 5pm on weekdays and 1pm on weekends: On Call pager: [phone]

## 2021-01-13 NOTE — H&P (Incomplete)
Date: 01/13/2021               Patient Name:  Charles [name] MRN: [id number]  DOB: 02/27/1966 Age / Sex: 55 y.o., male   PCP: Patient, No Pcp Per         Medical Service: Internal Medicine Teaching Service         Attending Physician: Dr. Evette Doffing, Mallie Mussel, *    First Contact: Dr. Wynetta Emery Pager: [phone]  Second Contact: Dr. Court Joy Pager: [phone]       After Hours (After 5p/  First Contact Pager: [phone]  weekends / holidays): Second Contact Pager: [phone]   Chief Complaint: Shortness of breath  History of Present Illness:  Charles [name] 55 year old man with past medical history of HFrEF, type 2 diabetes, hypertension, hyperlipidemia, paranoid schizophrenia, Hodgkin's lymphoma, who presents for worsening dyspnea for the past 2 days. States he feels his heart is beating real fast and any time he moves he has shortness of breath. States before he can walk short distances and then needs to sit down. Dyspnea also worsens when talking. States he normally uses 2 L [place] at night. Wore it yesterday during clinic visit due to his symptoms. But was unable to use it last night or this morning due to his batteries running out. He endorses abdominal distension with some leg swelling but not worsening. States since December his heart rate has been elevated. He feels like it has been faster over the past few days. Cough started back today. Feels similar to the cough that started in December.  Also endorses pain in the left side of his stomach with bloating and nausea. Last BM this morning x2. Reports loose stools says this has been going on for a few weeks. He did not have during last admission to and started again in the 2 days.   He has been taking all of his medications however last night he could not find his lasix and did not take it last night, also missed his morning medications. He is not urinating too much. When he first started taking lasix he had an increase in his urination but not much now.  Denies fevers, chills, headaches, chest pain, orthopnea, vomiting, syncope, weakness, numbness, or dizziness.   Meds:  No outpatient medications have been marked as taking for the 01/13/21 encounter [facility] Encounter).    Allergies: Allergies as of 01/13/2021 - Review Complete 01/13/2021  Allergen Reaction Noted  . Ibuprofen  11/09/2011  . Penicillins Rash 11/09/2011   Past Medical History:  Diagnosis Date  . Arthritis    lower back  . Bipolar 1 disorder ([place])   . Cardiomyopathy, nonischemic ([place])    no CAD 10/14/20 cath, EF 20-25% by 10/11/20 echo  . CHF (congestive heart failure) ([place])   . COVID 10/2020  . Depression   . DM2 (diabetes mellitus, type 2) ([place] [place])   . HTN (hypertension)   . Myocardial infarction [facility])     Family History:  Family History  Problem Relation Age of Onset  . Hypertension Mother   . Hyperlipidemia Mother   . Stroke Mother   . Cancer Mother   . Hypertension Father   . Hyperlipidemia Father   . Healthy Sister     Social History: Lives at an assisted living. Smokes 3-4 cigarettes a week.   Review of Systems: A complete ROS was negative except as per HPI.   Physical Exam: Blood pressure (!) 132/102, pulse (!) 119, temperature 98.5  F (36.9 C), temperature source (S) Rectal, resp. rate (!) 27, height 5\' 9"  (1.753 m), weight 111.6 kg, SpO2 96 %. Physical Exam Constitutional:      General: He is not in acute distress.    Appearance: He is well-developed. He is obese.  Cardiovascular:     Rate and Rhythm: Regular rhythm. Tachycardia present.     Heart sounds: No murmur heard. No friction rub. No gallop.   Pulmonary:     Effort: Tachypnea present.     Breath sounds: No wheezing, rhonchi or rales.  Abdominal:     General: Bowel sounds are normal.     Palpations: There is no mass.     Tenderness: There is no guarding or rebound.     Comments: Distended, mild left periumbilical tenderness  Musculoskeletal:     Cervical back: Normal range of  motion and neck supple.     Right lower leg: Edema (1+) present.     Left lower leg: Edema (1+) present.  Skin:    General: Skin is warm and dry.  Neurological:     General: No focal deficit present.     Mental Status: He is alert and oriented to person, place, and time.  Psychiatric:        Mood and Affect: Mood normal.        Behavior: Behavior normal.    EKG: personally reviewed my interpretation is sinus tachycardia with HR of 122, right axis deviation  DG Chest 2 View  Result Date: 01/13/2021 IMPRESSION: Cardiomegaly with mild vascular congestion and probable mild interstitial edema, improved from prior. Electronically Signed   By: Davina Poke D.O.   On: 01/13/2021 16:23   CT Angio Chest PE W and/or Wo Contrast  Result Date: 01/13/2021 IMPRESSION: No evidence of pulmonary emboli. Stable changes of pulmonary edema similar to that noted on the prior exam. Small left pleural effusion is seen. Resolution of previously seen right upper lobe airspace opacity consistent with a postinflammatory nature on the prior exam. Stable hilar and mediastinal adenopathy unchanged from the prior study. These changes are stable dating back to September of 2021 and likely of a reactive nature. Continued follow-up in 3-6 months to assess for stability is recommended. Electronically Signed   By: Inez Catalina M.D.   On: 01/13/2021 19:39    Assessment & Plan by Problem: Principal Problem:   Acute hypoxemic respiratory failure (HCC) Active Problems:   Sinus tachycardia   Acute on chronic heart failure with reduced ejection fraction and diastolic dysfunction (HCC)  Acute on chronic systolic heart failure Acute hypoxic respiratory failure Patient presents with increased dyspnea and palpitations for the past 2 days. Echo on 01/04/2021 with EF of 76-73%, grade 3 diastolic dysfunction. Recently admitted between 57/28/2022 and 57/11/2020 for heart failure exacerbation. Treated with IV lasix 120 mg with  improvement discharge home with 2L [place].  Presented to ED 2 days ago for similar symptoms and received IV lasix prior to discharge back to facility. He followed up in clinic yesterday with continued symptoms, appeared euvolemic on exam and continued on medications with infusion to follow up on 3/10 for increasing O2 need.   On exam today patient tachycardic to 120s. On 3L O2.  Increased respirations in high 20s worsened to 40 with movement but O2 saturations remained stable in mid 90s. Weight is down to 111.6 kg compared to 113.3 prior discharge. No JVD, abdomen is distended, 1+ pitting edema bilaterally. EKG with sinus tachycardia, troponin 18 and 20.  BNP 433 improved from 3 days ago.   - Cardiology consulted appreciate recommendations   - Continue Entresto 24-26mg  twice daily, spironolactone 12.5mg  daily,  metoprolol 75mg  daily, digoxin 123mcg daily, and dapagliflozin 10mg  daily - Strict ins and outs - Daily weights  Sinus tachycardia Patient with persistently tachycardia since last admission. HR in 120s on exam today. EKG with sinus tachycardia and right axis deviation without change from prior. Troponin flat.  During last admission ivabradine considered once HF stabalized. During clinic appointment yesterday noted concern for tachycardia due to over diuresis.  - continue tele  Hodgkin'slymphoma Mediastinal biopsy with stage Ia Hodgkin's disease, not currently on treatment. Followed up with CT surgery today. Plan establish with radiation oncology on 01/21/2021 for possible radiation therapy.  - Follow up with radiation oncology   Type 2 DM Hemoglobin A1c of 7.4 on 01/05/2021. On  dapagliflozin 10mg  daily. Taken of levemir during last admission as he did not require any sliding scale. - Continue dapagliflozin  - SSI - CBG monitoring  Hypothyroidism -Continue levothyroxine100 mcg daily  Hyperlipidemia -Continue Lipitor 10 mg daily  Paranoid  schizophrenia, -Continuehomerisperidone  Dispo: Admit patient to Inpatient with expected length of stay greater than 2 midnights.  Signed: Iona Beard, MD 01/13/2021, 11:24 PM  Pager: [phone] After 5pm on weekdays and 1pm on weekends: On Call pager: [phone]

## 2021-01-13 NOTE — Consult Note (Signed)
Cardiology Consult    Patient ID: Charles [name] MRN: [id number], DOB/AGE: April 11, 1966   Admit date: 01/13/2021 Date of Consult: 01/13/2021  Primary Physician: Alexandria Lodge, MD Primary Cardiologist: [name], MD Requesting Provider: Coral Ceo, PA-C  Patient Profile    Charles [name] is a 55 y.o. 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 He is being seen today for the evaluation of dyspnea and tachycardia.   He was diagnosed with heart failure during a hospitalization in early December after presenting with progressive dyspnea. Echo at the time showed severe LV dilation with a severely reduced EF of 20-25%. RV has not been well visualized, but no significant vavular disease. Underwent left and right heart cath. No significant coronary disease, but LVEDP was 28 at a weight of 124 kg, correlating with a BNP of 97. He was started on excellent GDMT and discharged at the same weight (124 kg), off oxygen with saturations in the low 90s. His weight continued to trend down on Lasix 40mg  (also on Entresto and Aldactone) and lower extremity swelling resolved. He was last hospitalized March 1 with heart failure and diuresed 6L to a weight of 113 kg. Lasix increased to 80mg . No other changes.  Of note, he had also been found to have worsening mediastinal lymphadenopathy during late 2021 and underwent biopsy in January that confirmed Hodgkin's lymphoma. He has not started any treatment thus far. He was also found to be COVID positive on preoperative screening and was actually seen subsequently in the ED with cough and associated chest pain, but did not require hospitalization. He does have frequent ED visits for suicidal ideations but has not been felt to require psych admission. He has been living in an assisted living  facility.  History of Present Illness   Sent to the ED from [place] surgery clinic today due to tachycardia. He says for the last couple of days he has been more short of breath, only able to ambulate a few steps before having to stop. Feels like his urine output has been decreased. Endorses orthopnea, slightly more than baseline. No leg swelling, but does have some abdominal fullness. No chest pain but feels like his heart is beating fast.   He was actually seen in the ED 2 days ago for the same issue. BNP was 438. He was given Lasix 80mg  IV and felt better, so he was discharged home. Weight at PCP follow-up visit yesterday was 111 kg.   ED course: Normotensive but tachypneic and tachycardic, sinus rhythm on ECG. Currently on 3L nasal canula.  BNP 433. Cr 1.14 (baseline). CTA negative for PE but c/w mild pulmonary edema.  Past Medical History   Past Medical History:  Diagnosis Date  . Arthritis    lower back  . Bipolar 1 disorder ([place])   . Cardiomyopathy, nonischemic ([place])    no CAD 10/14/20 cath, EF 20-25% by 10/11/20 echo  . CHF (congestive heart failure) ([place])   . COVID 10/2020  . Depression   . DM2 (diabetes mellitus, type 2) ([place])   . HTN (hypertension)   . Myocardial infarction [facility])     Past Surgical History:  Procedure Laterality Date  . BACK SURGERY  01/2020  . CARDIAC CATHETERIZATION  10/2020  . CHOLECYSTECTOMY    . HERNIA REPAIR    . INTERCOSTAL NERVE BLOCK Left 12/18/2020  Procedure: INTERCOSTAL NERVE BLOCK;  Surgeon: Melrose Nakayama, MD;  Location: [facility];  Service: Thoracic;  Laterality: Left;  . LYMPH NODE BIOPSY Left 12/18/2020   Procedure: LYMPH NODE BIOPSY;  Surgeon: Melrose Nakayama, MD;  Location: [facility];  Service: Thoracic;  Laterality: Left;  . RIGHT/LEFT HEART CATH AND CORONARY ANGIOGRAPHY N/A 10/14/2020   Procedure: RIGHT/LEFT HEART CATH AND CORONARY ANGIOGRAPHY;  Surgeon: Jettie Booze, MD;  Location: [facility];  Service:  Cardiovascular;  Laterality: N/A;  . SPINAL FIXATION SURGERY  02/26/2020  . XI ROBOTIC ASSISTED THORASCOPY FOR BIOPSY AP WINDOW LYMPH NODES (Left)  12/18/2020     Allergies  Allergen Reactions  . Ibuprofen     Unknown per MAR  . Penicillins Rash   Inpatient Medications    . furosemide  80 mg Intravenous Once    Family History    Family History  Problem Relation Age of Onset  . Hypertension Mother   . Hyperlipidemia Mother   . Stroke Mother   . Cancer Mother   . Hypertension Father   . Hyperlipidemia Father   . Healthy Sister    He indicated that the status of his mother is unknown. He indicated that the status of his father is unknown. He indicated that the status of his sister is unknown. He indicated that his brother is deceased.   Social History    Social History   Socioeconomic History  . Marital status: Divorced    Spouse name: Not on file  . Number of children: 1  . Years of education: Not on file  . Highest education level: Not on file  Occupational History  . Not on file  Tobacco Use  . Smoking status: Current Some Day Smoker    Packs/day: 0.25    Types: Cigarettes  . Smokeless tobacco: Never Used  . Tobacco comment: occasionally  Vaping Use  . Vaping Use: Never used  Substance and Sexual Activity  . Alcohol use: Never  . Drug use: Not Currently    Types: Cocaine  . Sexual activity: Not Currently  Other Topics Concern  . Not on file  Social History Narrative  . Not on file   Social Determinants of Health   Financial Resource Strain: Medium Risk  . Difficulty of Paying Living Expenses: Somewhat hard  Food Insecurity: No Food Insecurity  . Worried About Charity fundraiser in the Last Year: Never true  . Ran Out of Food in the Last Year: Never true  Transportation Needs: No Transportation Needs  . Lack of Transportation (Medical): No  . Lack of Transportation (Non-Medical): No  Physical Activity: Unknown  . Days of Exercise per Week: 0  days  . Minutes of Exercise per Session: Not on file  Stress: Not on file  Social Connections: Not on file  Intimate Partner Violence: Not on file     Review of Systems    A comprehensive review of systems was obtained with pertinent positives and negatives noted in the HPI.  Physical Exam    Blood pressure (!) 126/103, pulse (!) 125, temperature 98.5 F (36.9 C), temperature source (S) Rectal, resp. rate (!) 26, height 5\' 9"  (1.753 m), weight 111.6 kg, SpO2 100 %.    No intake or output data in the 24 hours ending 01/13/21 2200 Wt Readings from Last 3 Encounters:  01/13/21 111.6 kg  01/13/21 111.6 kg  01/12/21 111.9 kg   CONSTITUTIONAL: alert and conversant, appears older than his age,  tachypneic not in acute distress. HEENT: conjunctiva normal, EOM intact, pupils equal, no lid lag. NECK: JVP more easily appreciated on the left, CVP ~ 10 cm.  CARDIOVASCULAR: Regular rhythm. +S3. Faint systolic murmur at the LLSB. Radial pulses 2+. Adequately perfused. No lower extremity edema. PULMONARY/CHEST WALL: tachypnea without accessory muscle use. No deformities. Diminished breath sounds at the lung bases, faint course crackles to mid lung. Bronchial breath sounds anteriorly with occasional faint wheeze.  ABDOMINAL: soft, non-tender, non-distended SKIN: Dry and intact without apparent rashes or wounds. NEUROLOGIC: alert, no abnormal movements, cranial nerves grossly intact.   Labs    Cardiac Panel (last 3 results) Recent Labs    01/11/21 1434 01/13/21 1720 01/13/21 1941  TROPONINIHS 20* 16 16   BNP (last 3 results) Recent Labs    01/04/21 0507 01/11/21 1234 01/13/21 1741  BNP 592.9* 438.6* 433.1*    Lab Results  Component Value Date   WBC 12.4 (H) 01/13/2021   HGB 15.0 01/13/2021   HCT 46.8 01/13/2021   MCV 78.7 (L) 01/13/2021   PLT 223 01/13/2021    Recent Labs  Lab 01/11/21 1142 01/13/21 1720  NA 134* 134*  K 4.2 4.3  CL 105 105  CO2 20* 20*  BUN 10 13   CREATININE 1.14 1.14  CALCIUM 9.1 9.5  PROT 7.2  --   BILITOT 0.4  --   ALKPHOS 105  --   ALT 23  --   AST 21  --   GLUCOSE 114* 120*   Lab Results  Component Value Date   CHOL 217 (H) 10/13/2020   HDL 41 10/13/2020   LDLCALC 116 (H) 10/13/2020   TRIG 302 (H) 10/13/2020      Radiology Studies    DG Chest 2 View Result Date: 01/13/2021 Stable cardiomegaly. Mild vascular congestion. Mild interstitial prominence with improved aeration of the lung bases. Possible trace bilateral pleural effusions, decreased from prior. No new focal airspace consolidation. No pneumothorax.  IMPRESSION: Cardiomegaly with mild vascular congestion and probable mild interstitial edema, improved from prior.   CT Angio Chest PE W and/or Wo Contrast Result Date: 01/13/2021 FINDINGS: Cardiovascular: Thoracic aorta is incompletely opacified. No aneurysmal dilatation is noted. No cardiac enlargement is seen. Pulmonary artery shows a normal branching pattern no filling defects are identified to suggest pulmonary embolism. Mediastinum/Nodes: Thoracic inlet is within normal limits. Scattered stable adenopathy is noted similar to that seen on prior CT examination from 10 days previous. No significant increase is identified. The esophagus is within normal limits. Lungs/Pleura: Lungs are well aerated bilaterally. Small left-sided pleural effusion is again identified. Some patchy ground-glass changes are again identified most consistent with pulmonary edema. The rounded area of airspace disease in the right upper lobe laterally has resolved consistent with postinflammatory change. Upper Abdomen: Cholecystectomy is noted. No other focal abnormality in the upper abdomen is seen. Musculoskeletal: No acute bony abnormality is noted. Changes of prior laminectomy at T5-6 are again seen and stable. Review of the MIP images confirms the above findings.  IMPRESSION: No evidence of pulmonary emboli. Stable changes of pulmonary edema  similar to that noted on the prior exam. Small left pleural effusion is seen. Resolution of previously seen right upper lobe airspace opacity consistent with a postinflammatory nature on the prior exam. Stable hilar and mediastinal adenopathy unchanged from the prior study. These changes are stable dating back to September of 2021 and likely of a reactive nature.      ECG & Cardiac Imaging  ECG (current): sinus tachycardia, right axis deviation, IVCD, biatrial enlargement, borderline QT prolongation - personally reviewed.  ECHOCARDIOGRAM Result Date: 01/04/2021 1. Technically difficult; definity used; right heart not well visualized.   2. Left ventricular ejection fraction, by estimation, is 20 to 25%. The left ventricle has severely decreased function. The left ventricle demonstrates global hypokinesis. The left ventricular internal cavity size was severely dilated. Left ventricular diastolic parameters are consistent with Grade III diastolic dysfunction (restrictive).   3. Right ventricle was poorly visualized. 4. The mitral valve is normal in structure. Mild mitral valve regurgitation.    5. The aortic valve is tricuspid. Aortic valve regurgitation is trivial. No aortic stenosis is present.   6. The inferior vena cava is borderline dilated with approximately 50% respiratory variability. TR doppler inadequate for RVSP.  R/LHC, cors 10/14/2020:  No angiographically apparent CAD.  Personally reviewed tracings: RA 12/10/7, PA 40/28/32, RV 40/7/10, LVEDP 28, Ao 113/89/101. CO 4.96 L/min; CI 2.09. Ao sat 98%, PA sat 69%. No significant aortic valve gradient.   Assessment & Plan    1. Acute on chronic HFrEF due to NICM:  2. Post-capillary pulmonary hypertension 3. Sinus tachycardia  Heart failure history detailed above. Current presentation not entirely straight forward. Despite his weight being down his JVP is currently elevated and has at least some degree of pulmonary edema with NYHA  IIIb-IV symptoms. His repeat echo 2/27 also showed no improvement in LV size or function. This is despite excellent GDMT, administered at his ALF. This will also be his third hospitalization in 4 months. The sinus tachycardia is also not reassuring, though he seems adequately perfused. Suspect he may by stage D heart failure. He is clearly not a candidate for any advanced surgical heart failure therapy given his untreated lymphoma, history of inconsistent follow-up, lack of social support, and psychiatric issues (weight is also borderline). I think it is worth repeating invasive hemodynamics and challenging him with further diuresis. I think it remains unclear that he has ever truly reached a dry weight.  - Diuresis for goal negative 2L daily. Gave Lasix 80mg  IV in ED, assess response. - Monitor daily standing weights, strict I&Os, BMP/Mg. Monitor on telemetry. - Continue all outpatient HF medications at current doses: Entresto, Aldactone, metoprolol, digoxin, dapagliflozin - Consider palliative care consultation given advanced heart failure and lymphoma diagnoses. We should also involve the advanced heart failure service.  - Keep NPO for possible right heart cath tomorrow - His candidacy for primary prevention ICD is borderline. While he meets criteria by EF, his overall prognosis seems questionable.   Signed, Marykay Lex, MD 01/13/2021, 10:00 PM  For questions or updates, please contact   Please consult [email or web address] for contact info under Cardiology/STEMI.

## 2021-01-13 NOTE — ED Triage Notes (Signed)
Patient stated he came from the doctor's office and was sent here for shortness of breathe and fast heart rate

## 2021-01-13 NOTE — Consult Note (Incomplete)
Cardiology Consult    Patient ID: Charles [name] MRN: [id number], DOB/AGE: 12/12/1965   Admit date: 01/13/2021 Date of Consult: 01/13/2021  Primary Physician: Alexandria Lodge, MD Primary Cardiologist: [name], MD Requesting Provider: Coral Ceo, PA-C  Patient Profile    Charles [name] is a 55 y.o. 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 He is being seen today for the evaluation of dyspnea and tachycardia.   History of Present Illness   Sent to the ED from CT surgery clinic due to tachycardia. He says for the last couple of days he has been more short of breath, only able to ambulate a few steps before having to stop. Feels like his urine output has been decreased. Endorses orthopnea, slightly more than baseline. No leg swelling, but does have some abdominal fullness. No chest pain but feels like his heart is beating fast.  He was diagnosed with heart failure during a hospitalization in early December after presenting with progressive dyspnea. Echo at the time showed severe LV dilation with a severely reduced EF of 20-25%. RV has not been well visualized, but no significant vavular disease. Underwent left and right heart cath. No significant coronary disease, but LVEDP was 38 at a weight of 124 kg, correlating with a BNP of 97. He was started on GDMT and discharged at the same weight (124 kg), off oxygen with saturations in the low 90s. His weight continued to trend down on Lasix 40mg  (also on Entresto and Aldactone) and lower extremity swelling resolved. He had also been found to have worsening mediastinal lymphadenopathy and underwent biopsy in January that confirmed Hodgkin's lymphoma. Of note, he was found to be COVID positive on preoperative screening and was actually seen subsequently in the ED with  cough and associated chest pain, but did not require hospitalization.       Past Medical History   Past Medical History:  Diagnosis Date  . Arthritis    lower back  . Bipolar 1 disorder ([place])   . Cardiomyopathy, nonischemic ([place])    no CAD 10/14/20 cath, EF 20-25% by 10/11/20 echo  . CHF (congestive heart failure) ([place])   . COVID 10/2020  . Depression   . DM2 (diabetes mellitus, type 2) ([place])   . HTN (hypertension)   . Myocardial infarction [facility])     Past Surgical History:  Procedure Laterality Date  . BACK SURGERY  01/2020  . CARDIAC CATHETERIZATION  10/2020  . CHOLECYSTECTOMY    . HERNIA REPAIR    . INTERCOSTAL NERVE BLOCK Left 12/18/2020   Procedure: INTERCOSTAL NERVE BLOCK;  Surgeon: Melrose Nakayama, MD;  Location: [facility];  Service: Thoracic;  Laterality: Left;  . LYMPH NODE BIOPSY Left 12/18/2020   Procedure: LYMPH NODE BIOPSY;  Surgeon: Melrose Nakayama, MD;  Location: [facility];  Service: Thoracic;  Laterality: Left;  . RIGHT/LEFT HEART CATH AND CORONARY ANGIOGRAPHY N/A 10/14/2020   Procedure: RIGHT/LEFT HEART CATH AND CORONARY ANGIOGRAPHY;  Surgeon: Jettie Booze, MD;  Location: [facility];  Service: Cardiovascular;  Laterality: N/A;  . SPINAL FIXATION SURGERY  02/26/2020  . XI ROBOTIC ASSISTED THORASCOPY FOR BIOPSY AP WINDOW LYMPH NODES (Left)  12/18/2020     Allergies  Allergen Reactions  . Ibuprofen     Unknown per MAR  . Penicillins Rash   Inpatient Medications    .  furosemide  80 mg Intravenous Once    Family History    Family History  Problem Relation Age of Onset  . Hypertension Mother   . Hyperlipidemia Mother   . Stroke Mother   . Cancer Mother   . Hypertension Father   . Hyperlipidemia Father   . Healthy Sister    He indicated that the status of his mother is unknown. He indicated that the status of his father is unknown. He indicated that the status of his sister is unknown. He indicated that his brother is  deceased.   Social History    Social History   Socioeconomic History  . Marital status: Divorced    Spouse name: Not on file  . Number of children: 1  . Years of education: Not on file  . Highest education level: Not on file  Occupational History  . Not on file  Tobacco Use  . Smoking status: Current Some Day Smoker    Packs/day: 0.25    Types: Cigarettes  . Smokeless tobacco: Never Used  . Tobacco comment: occasionally  Vaping Use  . Vaping Use: Never used  Substance and Sexual Activity  . Alcohol use: Never  . Drug use: Not Currently    Types: Cocaine  . Sexual activity: Not Currently  Other Topics Concern  . Not on file  Social History Narrative  . Not on file   Social Determinants of Health   Financial Resource Strain: Medium Risk  . Difficulty of Paying Living Expenses: Somewhat hard  Food Insecurity: No Food Insecurity  . Worried About Charity fundraiser in the Last Year: Never true  . Ran Out of Food in the Last Year: Never true  Transportation Needs: No Transportation Needs  . Lack of Transportation (Medical): No  . Lack of Transportation (Non-Medical): No  Physical Activity: Unknown  . Days of Exercise per Week: 0 days  . Minutes of Exercise per Session: Not on file  Stress: Not on file  Social Connections: Not on file  Intimate Partner Violence: Not on file     Review of Systems    General:  No chills, fever, night sweats or weight changes.  Cardiovascular:  No chest pain, dyspnea on exertion, edema, orthopnea, palpitations, paroxysmal nocturnal dyspnea. Dermatological: No rash, lesions/masses Respiratory: No cough, dyspnea Urologic: No hematuria, dysuria Abdominal:   No nausea, vomiting, diarrhea, bright red blood per rectum, melena, or hematemesis Neurologic:  No visual changes, wkns, changes in mental status. All other systems reviewed and are otherwise negative except as noted above.  Physical Exam    Blood pressure (!) 126/103, pulse (!)  125, temperature 98.5 F (36.9 C), temperature source (S) Rectal, resp. rate (!) 26, height 5\' 9"  (1.753 m), weight 111.6 kg, SpO2 100 %.    No intake or output data in the 24 hours ending 01/13/21 2200 Wt Readings from Last 3 Encounters:  01/13/21 111.6 kg  01/13/21 111.6 kg  01/12/21 111.9 kg    CONSTITUTIONAL: alert and conversant, well-appearing, nourished, no distress HEENT: oropharynx clear and moist, no mucosal lesions, normal dentition, conjunctiva normal, EOM intact, pupils equal, no lid lag. NECK: supple, no cervical adenopathy, no thyromegaly CARDIOVASCULAR: Regular rhythm. No gallop, murmur, or rub. Normal S1/S2. Radial pulses intact. JVP ***. No carotid bruits. PULMONARY/CHEST WALL: no deformities, normal breath sounds bilaterally, normal work of breathing ABDOMINAL: soft, non-tender, non-distended EXTREMITIES: no edema or muscle atrophy, warm and well-perfused SKIN: Dry and intact without apparent rashes or wounds. Wound: {Exam;  wound:21617} NEUROLOGIC: alert, normal gait, no abnormal movements, cranial nerves grossly intact.   Labs    Cardiac Panel (last 3 results) Recent Labs    01/11/21 1434 01/13/21 1720 01/13/21 1941  TROPONINIHS 20* 16 16   BNP (last 3 results) Recent Labs    01/04/21 0507 01/11/21 1234 01/13/21 1741  BNP 592.9* 438.6* 433.1*    Lab Results  Component Value Date   WBC 12.4 (H) 01/13/2021   HGB 15.0 01/13/2021   HCT 46.8 01/13/2021   MCV 78.7 (L) 01/13/2021   PLT 223 01/13/2021    Recent Labs  Lab 01/11/21 1142 01/13/21 1720  NA 134* 134*  K 4.2 4.3  CL 105 105  CO2 20* 20*  BUN 10 13  CREATININE 1.14 1.14  CALCIUM 9.1 9.5  PROT 7.2  --   BILITOT 0.4  --   ALKPHOS 105  --   ALT 23  --   AST 21  --   GLUCOSE 114* 120*   Lab Results  Component Value Date   CHOL 217 (H) 10/13/2020   HDL 41 10/13/2020   LDLCALC 116 (H) 10/13/2020   TRIG 302 (H) 10/13/2020      Radiology Studies    DG Chest 2 View Result  Date: 01/13/2021 Stable cardiomegaly. Mild vascular congestion. Mild interstitial prominence with improved aeration of the lung bases. Possible trace bilateral pleural effusions, decreased from prior. No new focal airspace consolidation. No pneumothorax.  IMPRESSION: Cardiomegaly with mild vascular congestion and probable mild interstitial edema, improved from prior.   CT Angio Chest PE W and/or Wo Contrast Result Date: 01/13/2021 FINDINGS: Cardiovascular: Thoracic aorta is incompletely opacified. No aneurysmal dilatation is noted. No cardiac enlargement is seen. Pulmonary artery shows a normal branching pattern no filling defects are identified to suggest pulmonary embolism. Mediastinum/Nodes: Thoracic inlet is within normal limits. Scattered stable adenopathy is noted similar to that seen on prior CT examination from 10 days previous. No significant increase is identified. The esophagus is within normal limits. Lungs/Pleura: Lungs are well aerated bilaterally. Small left-sided pleural effusion is again identified. Some patchy ground-glass changes are again identified most consistent with pulmonary edema. The rounded area of airspace disease in the right upper lobe laterally has resolved consistent with postinflammatory change. Upper Abdomen: Cholecystectomy is noted. No other focal abnormality in the upper abdomen is seen. Musculoskeletal: No acute bony abnormality is noted. Changes of prior laminectomy at T5-6 are again seen and stable. Review of the MIP images confirms the above findings.  IMPRESSION: No evidence of pulmonary emboli. Stable changes of pulmonary edema similar to that noted on the prior exam. Small left pleural effusion is seen. Resolution of previously seen right upper lobe airspace opacity consistent with a postinflammatory nature on the prior exam. Stable hilar and mediastinal adenopathy unchanged from the prior study. These changes are stable dating back to September of 2021 and likely of a  reactive nature.      ECG & Cardiac Imaging    ECG (current): sinus tachycardia, right axis deviation, IVCD, biatrial enlargement, borderline QT prolongation - personally reviewed.  ECHOCARDIOGRAM Result Date: 01/04/2021 1. Technically difficult; definity used; right heart not well visualized.   2. Left ventricular ejection fraction, by estimation, is 20 to 25%. The left ventricle has severely decreased function. The left ventricle demonstrates global hypokinesis. The left ventricular internal cavity size was severely dilated. Left ventricular diastolic parameters are consistent with Grade III diastolic dysfunction (restrictive).   3. Right ventricle was poorly visualized. 4.  The mitral valve is normal in structure. Mild mitral valve regurgitation.    5. The aortic valve is tricuspid. Aortic valve regurgitation is trivial. No aortic stenosis is present.   6. The inferior vena cava is borderline dilated with approximately 50% respiratory variability. TR doppler inadequate for RVSP.  R/LHC, cors 10/14/2020:  No angiographically apparent CAD.  Personally reviewed tracings: RA 12/10/7, PA 40/28/32, RV 40/7/10, (no PCW tracing), LVEDP 36, Ao 113/89/101. CO 4.96 L/min; CI 2.09. Ao sat 98%, PA sat 69%. No significant aortic valve gradient.   Assessment & Plan     Acute on chronic HFrEF due to NICM:  JVP 10 cm Barriers to advanced surgical heart failure therapies include untreated lymphoma, inconsistent follow-up, and his weight (though not   Sinus tachycardia     Signed, Marykay Lex, MD 01/13/2021, 10:00 PM  For questions or updates, please contact   Please consult [email or web address] for contact info under Cardiology/STEMI.

## 2021-01-13 NOTE — Progress Notes (Signed)
[address]       [place] [address]             [phone]     HPI: Charles [name] returns for a scheduled follow-up visit  Charles [name] is a 55 year old man with a past history of congestive heart failure, bipolar disorder, schizophrenia, tobacco abuse, hypertension, dyslipidemia, obesity, type 2 diabetes, BPH, and mediastinal adenopathy.  In September 2020 he had a CTA to rule out a pulmonary embolus.  There was no PE but there was marked AP window adenopathy.  In early December he was admitted to the hospital with acute heart failure.  Catheterization revealed no coronary disease.  He had an ejection fraction of about 20% and was managed medically.  I did a robotic left VATS for biopsy of the mediastinal adenopathy on 12/18/2020.  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  He was in the hospital about 6 days as his heart failure was being managed with medications were being adjusted by cardiology.  From a surgical standpoint he did well.  He missed his initial follow-up appointment with me when he was readmitted to the hospital again with heart failure about a week ago.  He did see Dr. Lorenso Courier who referred him for radiation oncology evaluation.  He has an appointment with Dr. Isidore Moos next week.  He denies any pain related to his surgery.  He has felt like his heart is beating very strongly today.  He denies shortness of breath.  Past Medical History:  Diagnosis Date  . Arthritis    lower back  . Bipolar 1 disorder ([place])   . Cardiomyopathy, nonischemic ([place])    no CAD 10/14/20 cath, EF 20-25% by 10/11/20 echo  . CHF (congestive heart failure) ([place])   . COVID 10/2020  . Depression   . DM2 (diabetes mellitus, type 2) ([place])   . HTN (hypertension)   . Myocardial infarction [facility])     Current Outpatient Medications  Medication Sig Dispense Refill  . acetaminophen (TYLENOL) 325 MG tablet Take 650 mg by mouth every 6 (six) hours as needed for  moderate pain.    Marland Kitchen atorvastatin (LIPITOR) 10 MG tablet Take 10 mg by mouth daily.    . blood glucose meter kit and supplies KIT Dispense based on patient and insurance preference. Use up to four times daily as directed. (FOR ICD-9 250.00, 250.01). 1 each 0  . brimonidine (ALPHAGAN) 0.2 % ophthalmic solution Place 1 drop into both eyes 2 (two) times daily.     . dapagliflozin propanediol (FARXIGA) 10 MG TABS tablet Take 1 tablet (10 mg total) by mouth daily. 30 tablet 3  . digoxin (LANOXIN) 0.125 MG tablet Take 1 tablet (125 mcg total) by mouth daily. 30 tablet 11  . furosemide (LASIX) 80 MG tablet Take 1 tablet (80 mg total) by mouth daily. 30 tablet 0  . Insulin Pen Needle (PEN NEEDLES 29GX1/2") 29G X 12MM MISC 1 each by Does not apply route daily. 100 each 0  . latanoprost (XALATAN) 0.005 % ophthalmic solution Place 1 drop into both eyes at bedtime.     Marland Kitchen levothyroxine (SYNTHROID) 100 MCG tablet Take 100 mcg by mouth daily before breakfast.    . melatonin 3 MG TABS tablet Take 3 mg by mouth at bedtime.    . metoprolol succinate (TOPROL XL) 25 MG 24 hr tablet Take 3 tablets (75 mg total) by mouth daily. 90 tablet 0  . polyethylene glycol (MIRALAX /  GLYCOLAX) 17 g packet Take 17 g by mouth 2 (two) times daily. 14 each 0  . risperiDONE (RISPERDAL) 0.5 MG tablet Take 1 tablet (0.5 mg total) by mouth at bedtime. 30 tablet 0  . sacubitril-valsartan (ENTRESTO) 24-26 MG Take 1 tablet by mouth 2 (two) times daily.    Marland Kitchen senna (SENOKOT) 8.6 MG TABS tablet Take 2 tablets (17.2 mg total) by mouth 2 (two) times daily. 120 tablet 0  . spironolactone (ALDACTONE) 25 MG tablet Take 0.5 tablets (12.5 mg total) by mouth daily. 30 tablet 0  . tamsulosin (FLOMAX) 0.4 MG CAPS capsule Take 0.4 mg by mouth daily.     No current facility-administered medications for this visit.    Physical Exam BP 100/70 (BP Location: Left Arm, Patient Position: Sitting, Cuff Size: Normal) Comment: manual  Pulse (!) 116   Temp  97.9 F (36.6 C) (Skin)   Resp 20   Ht 5' 9"  (1.753 m)   Wt 246 lb (111.6 kg)   SpO2 95% Comment: RA  BMI 36.35 kg/m  55 year old man in no acute distress but chronically ill-appearing Tachycardic Lungs faint crackles at bases Incisions well-healed  Diagnostic Tests: I personally reviewed his chest x-ray.  It shows some vascular congestion actually improved from his film on 01/11/2021.  Impression: Charles [name] is a 55 year old man with a past history of congestive heart failure, bipolar disorder, schizophrenia, tobacco abuse, hypertension, dyslipidemia, obesity, type 2 diabetes, BPH, and mediastinal adenopathy.   He was found to have mediastinal adenopathy.  Biopsy showed Hodgkin's disease.  He has stage Ia disease.  He has seen Dr. Lorenso Courier.  The plan is to treat him with radiation therapy if he is a candidate.  He is seeing Dr. Isidore Moos next week.  He continues to have problems with decompensated heart failure.  He was admitted to the hospital recently with that after spending 6 days in the hospital having his medications adjusted after surgery.  He is tachycardic today and I recommended that he consider going to the emergency room for further evaluation.  He does not want to do so.  Plan: Follow-up with Dr. Lorenso Courier and Dr. Isidore Moos Follow-up with cardiology I will be happy to see Charles [name] back in the future if I can be of any further assistance with his care  Melrose Nakayama, MD [facility] [phone]

## 2021-01-13 NOTE — ED Provider Notes (Signed)
[facility] EMERGENCY DEPARTMENT Provider Note   CSN: [id number] Arrival date & time: 01/13/21  1707     History Chief Complaint  Patient presents with   Tachycardia   Shortness of Breath   Nausea    Charles [name] is a 55 y.o. male.  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 Reports associated cough that started today as well. Cough is nonproductive. Reports associated nausea, but denies vomiting. Denies chest pain, pleuritic pain, BLE swelling. Pt denies any drug or etoh use.  Past Medical History:  Diagnosis Date   Arthritis    lower back   Bipolar 1 disorder ([place])    Cardiomyopathy, nonischemic ([place])    no CAD 10/14/20 cath, EF 20-25% by 10/11/20 echo   CHF (congestive heart failure) ([place])    COVID 10/2020   Depression    DM2 (diabetes mellitus, type 2) ([place])    HTN (hypertension)    Myocardial infarction [facility])     Patient Active Problem List   Diagnosis Date Noted   HFrEF (heart failure with reduced ejection fraction) ([place]) 01/12/2021   Schizophrenia ([place]) 01/04/2021   Diarrhea 01/04/2021   Pulmonary edema    Mediastinal adenopathy 12/18/2020   Adjustment disorder with mixed anxiety and depressed mood 10/30/2020   Mediastinal lymphadenopathy 10/11/2020   Shortness of breath 10/11/2020   Sinus tachycardia 10/11/2020   Night sweats 10/11/2020   DM2 (diabetes mellitus, type 2) ([place]) 10/11/2020   Hodgkin's lymphoma ([place]) 10/11/2020   Hypertension 09/25/2020   BPH (benign prostatic hyperplasia) 09/25/2020   Insomnia 09/25/2020    Past Surgical History:  Procedure Laterality Date   BACK SURGERY  01/2020   CARDIAC CATHETERIZATION  10/2020    CHOLECYSTECTOMY     HERNIA REPAIR     INTERCOSTAL NERVE BLOCK Left 12/18/2020   Procedure: INTERCOSTAL NERVE BLOCK;  Surgeon: Melrose Nakayama, MD;  Location: [place];  Service: Thoracic;  Laterality: Left;   LYMPH NODE BIOPSY Left 12/18/2020   Procedure: LYMPH NODE BIOPSY;  Surgeon: Melrose Nakayama, MD;  Location: [place];  Service: Thoracic;  Laterality: Left;   RIGHT/LEFT HEART CATH AND CORONARY ANGIOGRAPHY N/A 10/14/2020   Procedure: RIGHT/LEFT HEART CATH AND CORONARY ANGIOGRAPHY;  Surgeon: Jettie Booze, MD;  Location: [place] [facility];  Service: Cardiovascular;  Laterality: N/A;   SPINAL FIXATION SURGERY  02/26/2020   XI ROBOTIC ASSISTED THORASCOPY FOR BIOPSY AP WINDOW LYMPH NODES (Left)  12/18/2020       Family History  Problem Relation Age of Onset   Hypertension Mother    Hyperlipidemia Mother    Stroke Mother    Cancer Mother    Hypertension Father    Hyperlipidemia Father    Healthy Sister     Social History   Tobacco Use   Smoking status: Current Some Day Smoker    Packs/day: 0.25    Types: Cigarettes   Smokeless tobacco: Never Used   Tobacco comment: occasionally  Vaping Use   Vaping Use: Never used  Substance Use Topics   Alcohol use: Never   Drug use: Not Currently    Types: Cocaine    Home Medications Prior to Admission medications   Medication Sig Start Date End  Date Taking? Authorizing Provider  acetaminophen (TYLENOL) 325 MG tablet Take 650 mg by mouth every 6 (six) hours as needed for moderate pain.    [provider]  atorvastatin (LIPITOR) 10 MG tablet Take 10 mg by mouth daily. 09/30/20   [provider]  blood glucose meter kit and supplies KIT Dispense based on patient and insurance preference. Use up to four times daily as directed. (FOR ICD-9 250.00, 250.01). 10/23/20   Barb Merino, MD  brimonidine (ALPHAGAN) 0.2 % ophthalmic solution Place 1 drop into both eyes 2 (two) times daily.      [provider]  dapagliflozin propanediol (FARXIGA) 10 MG TABS tablet Take 1 tablet (10 mg total) by mouth daily. 10/23/20   Barb Merino, MD  digoxin (LANOXIN) 0.125 MG tablet Take 1 tablet (125 mcg total) by mouth daily. 12/29/20 12/29/21  Darrick Grinder D, NP  furosemide (LASIX) 80 MG tablet Take 1 tablet (80 mg total) by mouth daily. 01/07/21   Madalyn Rob, MD  Insulin Pen Needle (PEN NEEDLES 29GX1/2") 29G X 12MM MISC 1 each by Does not apply route daily. 10/23/20   Barb Merino, MD  latanoprost (XALATAN) 0.005 % ophthalmic solution Place 1 drop into both eyes at bedtime.     [provider]  levothyroxine (SYNTHROID) 100 MCG tablet Take 100 mcg by mouth daily before breakfast.    [provider]  melatonin 3 MG TABS tablet Take 3 mg by mouth at bedtime. 09/08/20   [provider]  metoprolol succinate (TOPROL XL) 25 MG 24 hr tablet Take 3 tablets (75 mg total) by mouth daily. 01/06/21 02/05/21  Madalyn Rob, MD  polyethylene glycol (MIRALAX / GLYCOLAX) 17 g packet Take 17 g by mouth 2 (two) times daily. 10/23/20   Barb Merino, MD  risperiDONE (RISPERDAL) 0.5 MG tablet Take 1 tablet (0.5 mg total) by mouth at bedtime. 12/14/20   Lucrezia Starch, MD  sacubitril-valsartan (ENTRESTO) 24-26 MG Take 1 tablet by mouth 2 (two) times daily.    [provider]  senna (SENOKOT) 8.6 MG TABS tablet Take 2 tablets (17.2 mg total) by mouth 2 (two) times daily. 10/23/20   Barb Merino, MD  spironolactone (ALDACTONE) 25 MG tablet Take 0.5 tablets (12.5 mg total) by mouth daily. 01/07/21   Madalyn Rob, MD  tamsulosin (FLOMAX) 0.4 MG CAPS capsule Take 0.4 mg by mouth daily. 09/08/20   [provider]    Allergies    Ibuprofen and Penicillins  Review of Systems   Review of Systems  Constitutional: Positive for diaphoresis. Negative for fever.  HENT: Negative for ear pain and sore throat.   Eyes: Negative for visual disturbance.  Respiratory: Positive for  cough and shortness of breath.   Cardiovascular: Negative for chest pain and leg swelling.  Gastrointestinal: Positive for nausea. Negative for abdominal pain, constipation, diarrhea and vomiting.  Genitourinary: Negative for dysuria and hematuria.  Musculoskeletal: Negative for back pain.  Skin: Negative for rash.  Neurological: Negative for headaches.  All other systems reviewed and are negative.   Physical Exam Updated Vital Signs BP (!) 126/103    Pulse (!) 125    Temp (S) 98.5 F (36.9 C) (Rectal)    Resp (!) 26    Ht _0  (1.753 m)    Wt 111.6 kg    SpO2 100%    BMI 36.33 kg/m   Physical Exam Vitals and nursing note reviewed.  Constitutional:      Appearance: He is well-developed and  well-nourished.  HENT:     Head: Normocephalic and atraumatic.  Eyes:     Conjunctiva/sclera: Conjunctivae normal.  Cardiovascular:     Rate and Rhythm: Regular rhythm. Tachycardia present.     Heart sounds: No murmur heard.   Pulmonary:     Effort: Tachypnea present. No respiratory distress.     Breath sounds: Decreased breath sounds present.  Abdominal:     General: Bowel sounds are normal.     Palpations: Abdomen is soft.     Tenderness: There is no abdominal tenderness. There is no guarding or rebound.  Musculoskeletal:        General: No edema.     Cervical back: Neck supple.     Right lower leg: No tenderness. No edema.     Left lower leg: No tenderness. No edema.  Skin:    General: Skin is warm and dry.  Neurological:     Mental Status: He is alert.  Psychiatric:        Mood and Affect: Mood and affect normal.     ED Results / Procedures / Treatments   Labs (all labs ordered are listed, but only abnormal results are displayed) Labs Reviewed  BASIC METABOLIC PANEL - Abnormal; Notable for the following components:      Result Value   Sodium 134 (*)    CO2 20 (*)    Glucose, Bld 120 (*)    All other components within normal limits  CBC - Abnormal; Notable for the  following components:   WBC 12.4 (*)    RBC 5.95 (*)    MCV 78.7 (*)    MCH 25.2 (*)    RDW 18.2 (*)    All other components within normal limits  BRAIN NATRIURETIC PEPTIDE - Abnormal; Notable for the following components:   B Natriuretic Peptide 433.1 (*)    All other components within normal limits  DIGOXIN LEVEL - Abnormal; Notable for the following components:   Digoxin Level 0.2 (*)    All other components within normal limits  URINALYSIS, ROUTINE W REFLEX MICROSCOPIC  TROPONIN I (HIGH SENSITIVITY)  TROPONIN I (HIGH SENSITIVITY)    EKG None  Radiology DG Chest 2 View  Result Date: 01/13/2021 CLINICAL DATA:  Pulmonary edema EXAM: CHEST - 2 VIEW COMPARISON:  01/11/2021 FINDINGS: Stable cardiomegaly. Mild vascular congestion. Mild interstitial prominence with improved aeration of the lung bases. Possible trace bilateral pleural effusions, decreased from prior. No new focal airspace consolidation. No pneumothorax. IMPRESSION: Cardiomegaly with mild vascular congestion and probable mild interstitial edema, improved from prior. Electronically Signed   By: Davina Poke D.O.   On: 01/13/2021 16:23   CT Angio Chest PE W and/or Wo Contrast  Result Date: 01/13/2021 CLINICAL DATA:  Shortness of breath and tachycardia EXAM: CT ANGIOGRAPHY CHEST WITH CONTRAST TECHNIQUE: Multidetector CT imaging of the chest was performed using the standard protocol during bolus administration of intravenous contrast. Multiplanar CT image reconstructions and MIPs were obtained to evaluate the vascular anatomy. CONTRAST:  90m OMNIPAQUE IOHEXOL 350 MG/ML SOLN COMPARISON:  Chest x-ray from earlier in the same day, CT from 01/04/2021. FINDINGS: Cardiovascular: Thoracic aorta is incompletely opacified. No aneurysmal dilatation is noted. No cardiac enlargement is seen. Pulmonary artery shows a normal branching pattern no filling defects are identified to suggest pulmonary embolism. Mediastinum/Nodes: Thoracic inlet is  within normal limits. Scattered stable adenopathy is noted similar to that seen on prior CT examination from 10 days previous. No significant increase is identified. The esophagus  is within normal limits. Lungs/Pleura: Lungs are well aerated bilaterally. Small left-sided pleural effusion is again identified. Some patchy ground-glass changes are again identified most consistent with pulmonary edema. The rounded area of airspace disease in the right upper lobe laterally has resolved consistent with postinflammatory change. Upper Abdomen: Cholecystectomy is noted. No other focal abnormality in the upper abdomen is seen. Musculoskeletal: No acute bony abnormality is noted. Changes of prior laminectomy at T5-6 are again seen and stable. Review of the MIP images confirms the above findings. IMPRESSION: No evidence of pulmonary emboli. Stable changes of pulmonary edema similar to that noted on the prior exam. Small left pleural effusion is seen. Resolution of previously seen right upper lobe airspace opacity consistent with a postinflammatory nature on the prior exam. Stable hilar and mediastinal adenopathy unchanged from the prior study. These changes are stable dating back to September of 2021 and likely of a reactive nature. Continued follow-up in 3-6 months to assess for stability is recommended. Electronically Signed   By: Inez Catalina M.D.   On: 01/13/2021 19:39    Procedures Procedures   Medications Ordered in ED Medications  furosemide (LASIX) injection 80 mg (has no administration in time range)  iohexol (OMNIPAQUE) 350 MG/ML injection 100 mL (76 mLs Intravenous Contrast Given 01/13/21 1925)    ED Course  I have reviewed the triage vital signs and the nursing notes.  Pertinent labs & imaging results that were available during my care of the patient were reviewed by me and considered in my medical decision making (see chart for details).    MDM Rules/Calculators/A&P                          55  y/o M presenting for eval of SOB and palpitations  Reviewed/interpreted labs CBC with mild leukocytosis at 12  BMP with mild hyponatremia, mildly low co2, appears at baseline for him BNP is stable at 433 from several days ago Trop is negative x2 Dig level is subtherapeutic  EKG - with sinus tach, right axis deviation, pulmonary disease pattern  CXR from earlier today reviewed/interpreted - Cardiomegaly with mild vascular congestion and probable mild interstitial edema, improved from prior. CTA chest - No evidence of pulmonary emboli. Stable changes of pulmonary edema similar to that noted on the prior exam. Small left pleural effusion is seen. Resolution of previously seen right upper lobe airspace opacity consistent with a postinflammatory nature on the prior exam. Stable hilar and mediastinal adenopathy unchanged from the prior study. These changes are stable dating back to September of 2021 and likely of a reactive nature. Continued follow-up in 3-6 months to assess for stability is recommended.  Pt w/u shows that his heart failure is likely stable from his recent admission and w/u's however he continues to c/o sob and is tachypneic on eval. He does not have any significant peripheral edema and his lungs are generally clear but diminished throughout. Will consult cardiology for their recommendations on this patient.  9:13 PM CONSULT with cardiology, Dr. Kalman Shan who will come see the patient.   9:48 PM Discussed with Dr. Kalman Shan who recommends diuresis and admission to internal medicine service  At shift change, pt pending admission. Care transitioned to Dr. Melina Copa to f/u on pending admission    Final Clinical Impression(s) / ED Diagnoses Final diagnoses:  Acute on chronic congestive heart failure, unspecified heart failure type [facility])    Rx / DC Orders ED Discharge Orders  None       Bishop Dublin 01/13/21 2157    Hayden Rasmussen, MD 01/14/21 [phone]

## 2021-01-13 NOTE — ED Notes (Signed)
Called lab regarding digoxin level test, this RN sent dark green top with pts label together with other labs.

## 2021-01-14 DIAGNOSIS — J9601 Acute respiratory failure with hypoxia: Secondary | ICD-10-CM | POA: Diagnosis not present

## 2021-01-14 DIAGNOSIS — I509 Heart failure, unspecified: Secondary | ICD-10-CM

## 2021-01-14 DIAGNOSIS — I5043 Acute on chronic combined systolic (congestive) and diastolic (congestive) heart failure: Secondary | ICD-10-CM | POA: Diagnosis not present

## 2021-01-14 DIAGNOSIS — C8190 Hodgkin lymphoma, unspecified, unspecified site: Secondary | ICD-10-CM

## 2021-01-14 DIAGNOSIS — E119 Type 2 diabetes mellitus without complications: Secondary | ICD-10-CM | POA: Diagnosis not present

## 2021-01-14 DIAGNOSIS — R000 Tachycardia, unspecified: Secondary | ICD-10-CM | POA: Diagnosis not present

## 2021-01-14 LAB — GLUCOSE, CAPILLARY
Glucose-Capillary: 124 mg/dL — ABNORMAL HIGH (ref 70–99)
Glucose-Capillary: 189 mg/dL — ABNORMAL HIGH (ref 70–99)
Glucose-Capillary: 148 mg/dL — ABNORMAL HIGH (ref 70–99)
Glucose-Capillary: 127 mg/dL — ABNORMAL HIGH (ref 70–99)

## 2021-01-14 LAB — BASIC METABOLIC PANEL
Anion gap: 12 (ref 5–15)
BUN: 11 mg/dL (ref 6–20)
CO2: 22 mmol/L (ref 22–32)
Calcium: 9.5 mg/dL (ref 8.9–10.3)
Chloride: 100 mmol/L (ref 98–111)
Creatinine, Ser: 1.07 mg/dL (ref 0.61–1.24)
GFR, Estimated: >60 mL/min (ref >60)
Glucose, Bld: 149 mg/dL — ABNORMAL HIGH (ref 70–99)
Potassium: 3.6 mmol/L (ref 3.5–5.1)
Sodium: 134 mmol/L — ABNORMAL LOW (ref 135–145)

## 2021-01-14 LAB — CBC
HCT: 47.8 % (ref 39.0–52.0)
Hemoglobin: 15.6 g/dL (ref 13.0–17.0)
MCH: 25.3 pg — ABNORMAL LOW (ref 26.0–34.0)
MCHC: 32.6 g/dL (ref 30.0–36.0)
MCV: 77.5 fL — ABNORMAL LOW (ref 80.0–100.0)
Platelets: 216 10*3/uL (ref 150–400)
RBC: 6.17 MIL/uL — ABNORMAL HIGH (ref 4.22–5.81)
RDW: 18.3 % — ABNORMAL HIGH (ref 11.5–15.5)
WBC: 12.8 10*3/uL — ABNORMAL HIGH (ref 4.0–10.5)
nRBC: 0 % (ref 0.0–0.2)

## 2021-01-14 MED ORDER — POTASSIUM CHLORIDE 20 MEQ PO PACK
40.0000 meq | PACK | Freq: Once | ORAL | Status: AC
  Administered 2021-01-14: 40 meq via ORAL
  Filled 2021-01-14: qty 2

## 2021-01-14 MED ORDER — FUROSEMIDE 10 MG/ML IJ SOLN
80.0000 mg | Freq: Every day | INTRAMUSCULAR | Status: DC
  Administered 2021-01-14: 80 mg via INTRAVENOUS
  Filled 2021-01-14: qty 8

## 2021-01-14 NOTE — Progress Notes (Addendum)
Progress Note  Patient Name: Charles [name] Date of Encounter: 01/14/2021  Primary Cardiologist: Dr. [name], MD  Subjective   [name] is a 55 year old male with PMHx of HFrEF (EF 20-25%) due to non-ishcemic cardiomyopathy, hypertension, diabetes mellitus type II, Hodgkin's lymphoma involving the mediastinum admitted for acute hypoxic respiratory failure secondary to acute on chronic heart failure exacerbation. He received one dose of IV Lasix 80mg  with ~2.3L UOP.  This morning, [name] reports continued palpitations and feels some shortness of breath but improved since admission. He denies any chest pain/pressure at this time but does endorse some epigastric and LLQ abdominal pain.   Inpatient Medications    Scheduled Meds: . atorvastatin  10 mg Oral Daily  . brimonidine  1 drop Both Eyes BID  . dapagliflozin propanediol  10 mg Oral Daily  . digoxin  125 mcg Oral Daily  . enoxaparin (LOVENOX) injection  40 mg Subcutaneous Q24H  . furosemide  80 mg Intravenous Daily  . insulin aspart  0-15 Units Subcutaneous TID WC  . latanoprost  1 drop Both Eyes QHS  . levothyroxine  100 mcg Oral Q0600  . melatonin  3 mg Oral QHS  . metoprolol succinate  75 mg Oral Daily  . risperiDONE  0.5 mg Oral QHS  . sacubitril-valsartan  1 tablet Oral BID  . spironolactone  12.5 mg Oral Daily  . tamsulosin  0.4 mg Oral Daily   Continuous Infusions:  PRN Meds: acetaminophen **OR** acetaminophen, ondansetron **OR** ondansetron (ZOFRAN) IV   Vital Signs    Vitals:   01/13/21 2330 01/13/21 2350 01/14/21 0517 01/14/21 0754  BP: 107/90 116/83 104/82 (!) 115/91  Pulse: (!) 112 (!) 119 98   Resp: (!) 25 (!) 22 20 20   Temp:  98 F (36.7 C) 97.8 F (36.6 C) 97.8 F (36.6 C)  TempSrc:  Oral Axillary Oral  SpO2: 98% 98% 97% 100%  Weight:      Height:        Intake/Output Summary (Last 24 hours) at 01/14/2021 1023 Last data filed at 01/14/2021 0310 Gross per 24 hour  Intake 240 ml   Output 2350 ml  Net -2110 ml   Filed Weights   01/13/21 1944  Weight: 111.6 kg    Telemetry    Sinus tachycardia, HR to 110's - Personally Reviewed  ECG    Sinus tachycardia with right axis deviation, HR 118 - Personally Reviewed  Physical Exam  Physical Exam  Constitutional: Appears well-developed and well-nourished. No distress.  HENT: Normocephalic and atraumatic, EOMI, conjunctiva normal, moist mucous membranes; no significant JVD noted  Cardiovascular: Normal rate, regular rhythm, S1 and S2 present, faint systolic murmur, rubs, gallops.  Distal pulses intact Respiratory: No respiratory distress, no accessory muscle use.  Effort is normal. Slightly diminished bibasilar breath sounds  GI: Distended, soft, tenderness to palpation in epigastric and LLQ region; no rebound or guarding, active bowel sounds Musculoskeletal: Normal bulk and tone.  No peripheral edema noted. Neurological: Is alert and oriented x4, no apparent focal deficits noted. Skin: Warm and dry.  No rash, erythema, lesions noted. Psychiatric: Normal mood and affect. Behavior is normal. Judgment and thought content normal.    Labs    Chemistry Recent Labs  Lab 01/11/21 1142 01/13/21 1720 01/14/21 0241  NA 134* 134* 134*  K 4.2 4.3 3.6  CL 105 105 100  CO2 20* 20* 22  GLUCOSE 114* 120* 149*  BUN 10 13 11   CREATININE 1.14  1.14 1.07  CALCIUM 9.1 9.5 9.5  PROT 7.2  --   --   ALBUMIN 3.5  --   --   AST 21  --   --   ALT 23  --   --   ALKPHOS 105  --   --   BILITOT 0.4  --   --   GFRNONAA >60 >60 >60  ANIONGAP 9 9 12      Hematology Recent Labs  Lab 01/11/21 1142 01/13/21 1720 01/14/21 0241  WBC 10.8* 12.4* 12.8*  RBC 5.94* 5.95* 6.17*  HGB 15.3 15.0 15.6  HCT 46.4 46.8 47.8  MCV 78.1* 78.7* 77.5*  MCH 25.8* 25.2* 25.3*  MCHC 33.0 32.1 32.6  RDW 18.0* 18.2* 18.3*  PLT 228 223 216    Cardiac EnzymesNo results for input(s): TROPONINI in the last 168 hours. No results for input(s):  TROPIPOC in the last 168 hours.   BNP Recent Labs  Lab 01/11/21 1234 01/13/21 1741  BNP 438.6* 433.1*     DDimer No results for input(s): DDIMER in the last 168 hours.   Radiology    DG Chest 2 View  Result Date: 01/13/2021 CLINICAL DATA:  Pulmonary edema EXAM: CHEST - 2 VIEW COMPARISON:  01/11/2021 FINDINGS: Stable cardiomegaly. Mild vascular congestion. Mild interstitial prominence with improved aeration of the lung bases. Possible trace bilateral pleural effusions, decreased from prior. No new focal airspace consolidation. No pneumothorax. IMPRESSION: Cardiomegaly with mild vascular congestion and probable mild interstitial edema, improved from prior. Electronically Signed   By: Davina Poke D.O.   On: 01/13/2021 16:23   CT Angio Chest PE W and/or Wo Contrast  Result Date: 01/13/2021 CLINICAL DATA:  Shortness of breath and tachycardia EXAM: CT ANGIOGRAPHY CHEST WITH CONTRAST TECHNIQUE: Multidetector CT imaging of the chest was performed using the standard protocol during bolus administration of intravenous contrast. Multiplanar CT image reconstructions and MIPs were obtained to evaluate the vascular anatomy. CONTRAST:  47mL OMNIPAQUE IOHEXOL 350 MG/ML SOLN COMPARISON:  Chest x-ray from earlier in the same day, CT from 01/04/2021. FINDINGS: Cardiovascular: Thoracic aorta is incompletely opacified. No aneurysmal dilatation is noted. No cardiac enlargement is seen. Pulmonary artery shows a normal branching pattern no filling defects are identified to suggest pulmonary embolism. Mediastinum/Nodes: Thoracic inlet is within normal limits. Scattered stable adenopathy is noted similar to that seen on prior CT examination from 10 days previous. No significant increase is identified. The esophagus is within normal limits. Lungs/Pleura: Lungs are well aerated bilaterally. Small left-sided pleural effusion is again identified. Some patchy ground-glass changes are again identified most consistent with  pulmonary edema. The rounded area of airspace disease in the right upper lobe laterally has resolved consistent with postinflammatory change. Upper Abdomen: Cholecystectomy is noted. No other focal abnormality in the upper abdomen is seen. Musculoskeletal: No acute bony abnormality is noted. Changes of prior laminectomy at T5-6 are again seen and stable. Review of the MIP images confirms the above findings. IMPRESSION: No evidence of pulmonary emboli. Stable changes of pulmonary edema similar to that noted on the prior exam. Small left pleural effusion is seen. Resolution of previously seen right upper lobe airspace opacity consistent with a postinflammatory nature on the prior exam. Stable hilar and mediastinal adenopathy unchanged from the prior study. These changes are stable dating back to September of 2021 and likely of a reactive nature. Continued follow-up in 3-6 months to assess for stability is recommended. Electronically Signed   By: Inez Catalina M.D.   On: 01/13/2021  19:39    Cardiac Studies   Echo 01/04/2021>  IMPRESSIONS  1. Technically difficult; definity used; right heart not well visualized.  2. Left ventricular ejection fraction, by estimation, is 20 to 25%. The  left ventricle has severely decreased function. The left ventricle  demonstrates global hypokinesis. The left ventricular internal cavity size  was severely dilated. Left ventricular  diastolic parameters are consistent with Grade III diastolic dysfunction  (restrictive).  3. Right ventricular systolic function is normal. The right ventricular  size is normal.  4. The mitral valve is normal in structure. Mild mitral valve  regurgitation. No evidence of mitral stenosis.  5. The aortic valve is tricuspid. Aortic valve regurgitation is trivial.  No aortic stenosis is present.  6. The inferior vena cava is normal in size with greater than 50%  respiratory variability, suggesting right atrial pressure of 3 mmHg.    RHC/LHC, 10/14/2020>   No angiographically apparent CAD.  Personally reviewed tracings: RA 12/10/7, PA 40/28/32, RV 40/7/10, LVEDP 28, Ao 113/89/101. CO 4.96 L/min; CI 2.09. Ao sat 98%, PA sat 69%. No significant aortic valve gradient.  Patient Profile     55 y.o. male with PMHx of HFrEF (EF 20-25%) due to NICM, hypertension, diabetes mellitus, recent diagnosis of Hodgkin's lymphoma admitted for acute HFrEF exacerbation.   Assessment & Plan    Acute on chronic HFrEF due to NICM:  Patient admitted with acute hypoxic respiratory failure in setting of acute on chronic HFrEF exacerbation. He received IV Lasix 80mg  with ~2.3L UOP overnight. He appears near euvolemic on exam this morning. He is down 2kg from discharge weight (113kg>111kg). No history of medication noncompliance and he is on GDMT with Entresto, aldactone, metoprolol, digoxin and [place].  Will continue with diuresis today and re-evaluate for possible heart cath tomorrow.  - Continue IV Lasix 80mg  daily - Strict I/O, daily weights - Continue Entresto, Aldactone, Metoprolol, Digoxin and Farxiga - Monitor renal function and electrolytes   Sinus tachycardia:  Persistent tachycardia since last admission with HR in 110's. Consideration for ivabradine given persistent tachycardia which may be contributing to his heart failure symptoms. His HR did slightly improve this morning to low 100's after receiving the metoprolol.  - Continue metoprolol 75mg  daily, can consider increasing this  - Continue cardiac monitoring  Signed, Harvie Heck, MD Internal Medicine, PGY-2 01/14/21 11:40 AM Pager # [phone]  Personally seen and examined. Agree with above.   Currently resting comfortably.  Doing well.  Respiratory effort seems normal at this point. Continuing with Lasix 80 IV twice a day -On multidrug regimen excellent for chronic systolic heart failure.  Nonischemic cardiomyopathy.  We will continue to follow.  Candee Furbish,  MD

## 2021-01-14 NOTE — Progress Notes (Signed)
   Subjective:   No acute overnight events.  Reports feeling better, still some shortness of breath when lying flat on his back. Wants to eat.  Objective:  Vital signs in last 24 hours: Vitals:   01/13/21 2330 01/13/21 2350 01/14/21 0517 01/14/21 0754  BP: 107/90 116/83 104/82 (!) 115/91  Pulse: (!) 112 (!) 119 98   Resp: (!) 25 (!) 22 20 20   Temp:  98 F (36.7 C) 97.8 F (36.6 C) 97.8 F (36.6 C)  TempSrc:  Oral Axillary Oral  SpO2: 98% 98% 97% 100%  Weight:      Height:       Physical Exam: General: obese middle-aged male, lying in bed, NAD. CV: slightly tachycardic rate and regular rhythm, no m/r/g. JVP 6cm. Pulm: CTABL, no respiratory distress. MSK: no peripheral edema noted bilaterally. Neuro: AAOx4, no focal deficits noted. Skin: warm and dry.  Assessment/Plan:  Principal Problem:   Acute on chronic heart failure with reduced ejection fraction and diastolic dysfunction (HCC) Active Problems:   Sinus tachycardia   DM2 (diabetes mellitus, type 2) (HCC)   Hodgkin's lymphoma ([place])   Acute hypoxemic respiratory failure ([place])  Charles [name] is a 55 year old male with history of HFrEF (20-25%) due to nonischemic cardiomyopathy, HTN, T2DM, and Hodgkin's lymphoma (not yet started radiation therapy) admitted for acute hypoxic respiratory failure 2/2 HFrEF exacerbation.  Acute on chronic HFrEF 2/2 NICM Acute hypoxic respiratory failure Possible exacerbation from having issues with home oxygen tank and from missing home lasix doses for past 2 days prior to admission. 2.3L of UOP over past 24 hours, on IV lasix 80mg  daily. He is euvolemic on exam today, but may be somewhat obscured due to obesity. Unsure what his actual dry weight is, but currently at 111.6kg (recent discharge weight 113kg). On goal-directed medical therapy with entresto, spironolactone, metoprolol, digoxin, and farxiga. Cardiology following, possible plan for right heart catheterization tomorrow.  -greatly  appreciate cardiology recs -restarted diet today, but NPO at midnight for possible RHC tomorrow -continue IV lasix 80mg  daily -continue oxygen supplementation -strict I/O's -daily standing weights -trending BMP to monitor renal function and electrolytes  Sinus Tachycardia Possibly linked to HFrEF exacerbation. HR slightly improved this morning to low 100s. On metoprolol 75mg  daily. -continue cardiac monitoring -continue metoprolol 75mg  daily, can increase if needed  Prior to Admission Living Arrangement: Home Anticipated Discharge Location: TBD Barriers to Discharge: continued medical management Dispo: Anticipated discharge in approximately 1-2 day(s).   Charles Axe, MD 01/14/2021, 1:05 PM Pager: [phone] After 5pm on weekdays and 1pm on weekends: On Call pager [phone]

## 2021-01-14 NOTE — Plan of Care (Signed)

## 2021-01-14 NOTE — Progress Notes (Signed)
Internal Medicine Clinic Attending  Case discussed with Dr. Watson  At the time of the visit.  We reviewed the resident's history and exam and pertinent patient test results.  I agree with the assessment, diagnosis, and plan of care documented in the resident's note.  

## 2021-01-15 ENCOUNTER — Encounter: Payer: Medicaid Other | Admitting: Student

## 2021-01-15 DIAGNOSIS — J9601 Acute respiratory failure with hypoxia: Secondary | ICD-10-CM | POA: Diagnosis not present

## 2021-01-15 DIAGNOSIS — E119 Type 2 diabetes mellitus without complications: Secondary | ICD-10-CM | POA: Diagnosis not present

## 2021-01-15 DIAGNOSIS — C8190 Hodgkin lymphoma, unspecified, unspecified site: Secondary | ICD-10-CM | POA: Diagnosis not present

## 2021-01-15 DIAGNOSIS — I5043 Acute on chronic combined systolic (congestive) and diastolic (congestive) heart failure: Secondary | ICD-10-CM | POA: Diagnosis not present

## 2021-01-15 DIAGNOSIS — K5900 Constipation, unspecified: Secondary | ICD-10-CM

## 2021-01-15 LAB — BASIC METABOLIC PANEL
Anion gap: 11 (ref 5–15)
BUN: 14 mg/dL (ref 6–20)
CO2: 24 mmol/L (ref 22–32)
Calcium: 9.5 mg/dL (ref 8.9–10.3)
Chloride: 101 mmol/L (ref 98–111)
Creatinine, Ser: 1.15 mg/dL (ref 0.61–1.24)
GFR, Estimated: >60 mL/min (ref >60)
Glucose, Bld: 121 mg/dL — ABNORMAL HIGH (ref 70–99)
Potassium: 4.1 mmol/L (ref 3.5–5.1)
Sodium: 136 mmol/L (ref 135–145)

## 2021-01-15 LAB — MAGNESIUM: Magnesium: 2.1 mg/dL (ref 1.7–2.4)

## 2021-01-15 LAB — GLUCOSE, CAPILLARY
Glucose-Capillary: 141 mg/dL — ABNORMAL HIGH (ref 70–99)
Glucose-Capillary: 166 mg/dL — ABNORMAL HIGH (ref 70–99)
Glucose-Capillary: 143 mg/dL — ABNORMAL HIGH (ref 70–99)
Glucose-Capillary: 149 mg/dL — ABNORMAL HIGH (ref 70–99)

## 2021-01-15 MED ORDER — POLYETHYLENE GLYCOL 3350 17 G PO PACK
17.0000 g | PACK | Freq: Every day | ORAL | Status: DC
  Administered 2021-01-15 – 2021-01-16 (×2): 17 g via ORAL
  Filled 2021-01-15 (×2): qty 1

## 2021-01-15 MED ORDER — POTASSIUM CHLORIDE CRYS ER 20 MEQ PO TBCR
40.0000 meq | EXTENDED_RELEASE_TABLET | Freq: Two times a day (BID) | ORAL | Status: DC
  Administered 2021-01-15 – 2021-01-16 (×3): 40 meq via ORAL
  Filled 2021-01-15 (×3): qty 2

## 2021-01-15 MED ORDER — FUROSEMIDE 10 MG/ML IJ SOLN: 80.0000 mg | Freq: Two times a day (BID) | INTRAMUSCULAR | Status: DC

## 2021-01-15 MED ORDER — FUROSEMIDE 10 MG/ML IJ SOLN
80.0000 mg | Freq: Every day | INTRAMUSCULAR | Status: DC
  Administered 2021-01-15 – 2021-01-16 (×2): 80 mg via INTRAVENOUS
  Filled 2021-01-15 (×2): qty 8

## 2021-01-15 MED ORDER — SENNA 8.6 MG PO TABS
1.0000 | ORAL_TABLET | Freq: Every day | ORAL | Status: DC
  Administered 2021-01-15 – 2021-01-16 (×2): 8.6 mg via ORAL
  Filled 2021-01-15 (×2): qty 1

## 2021-01-15 NOTE — Progress Notes (Addendum)
Progress Note  Patient Name: Charles [name] Date of Encounter: 01/15/2021  Primary Cardiologist: Dr [name] MD  Subjective   No acute overnight events reported. This morning, [name] is sitting up in bedside chair. He is on 4L O2. He reports improvement in his palpitations and overall breathing. He denies any abdominal pain but does express frustration with not being able to eat.   Inpatient Medications    Scheduled Meds: . atorvastatin  10 mg Oral Daily  . brimonidine  1 drop Both Eyes BID  . dapagliflozin propanediol  10 mg Oral Daily  . digoxin  125 mcg Oral Daily  . enoxaparin (LOVENOX) injection  40 mg Subcutaneous Q24H  . furosemide  80 mg Intravenous Daily  . insulin aspart  0-15 Units Subcutaneous TID WC  . latanoprost  1 drop Both Eyes QHS  . levothyroxine  100 mcg Oral Q0600  . melatonin  3 mg Oral QHS  . metoprolol succinate  75 mg Oral Daily  . risperiDONE  0.5 mg Oral QHS  . sacubitril-valsartan  1 tablet Oral BID  . spironolactone  12.5 mg Oral Daily  . tamsulosin  0.4 mg Oral Daily   Continuous Infusions:  PRN Meds: acetaminophen **OR** acetaminophen, ondansetron **OR** ondansetron (ZOFRAN) IV   Vital Signs    Vitals:   01/14/21 0754 01/14/21 1920 01/14/21 2152 01/15/21 0504  BP: (!) 115/91 90/78  128/72  Pulse:  (!) 104  97  Resp: 20 20  20   Temp: 97.8 F (36.6 C) 98.1 F (36.7 C)  97.8 F (36.6 C)  TempSrc: Oral Oral  Axillary  SpO2: 100% 98%  99%  Weight:   111 kg   Height:        Intake/Output Summary (Last 24 hours) at 01/15/2021 0722 Last data filed at 01/15/2021 0200 Gross per 24 hour  Intake 320 ml  Output 1050 ml  Net -730 ml   Filed Weights   01/13/21 1944 01/14/21 2152  Weight: 111.6 kg 111 kg    Telemetry    NSR, HR 90's - Personally Reviewed  Physical Exam  Physical Exam  Constitutional: Appears well-developed and well-nourished. No distress.  HENT: Normocephalic and atraumatic, EOMI, conjunctiva  normal, moist mucous membranes Cardiovascular: Normal rate, regular rhythm, S1 and S2 present, no murmurs, rubs, gallops.  Distal pulses intact. No significant JVD noted.  Respiratory: No respiratory distress, no accessory muscle use.  Effort is normal. Slightly diminished bibasilar breath sounds.  GI: Nondistended, soft, nontender to palpation, active bowel sounds Musculoskeletal: Normal bulk and tone.  No peripheral edema noted. Neurological: Is alert and oriented x4, no apparent focal deficits noted. Skin: No rash, erythema, lesions noted. Bilateral lower extremities cool to touch.  Psychiatric: Normal mood and affect. Behavior is normal. Judgment and thought content normal.   Labs    Chemistry Recent Labs  Lab 01/11/21 1142 01/13/21 1720 01/14/21 0241  NA 134* 134* 134*  K 4.2 4.3 3.6  CL 105 105 100  CO2 20* 20* 22  GLUCOSE 114* 120* 149*  BUN 10 13 11   CREATININE 1.14 1.14 1.07  CALCIUM 9.1 9.5 9.5  PROT 7.2  --   --   ALBUMIN 3.5  --   --   AST 21  --   --   ALT 23  --   --   ALKPHOS 105  --   --   BILITOT 0.4  --   --   GFRNONAA >60 >60 >60  ANIONGAP  9 9 12      Hematology Recent Labs  Lab 01/11/21 1142 01/13/21 1720 01/14/21 0241  WBC 10.8* 12.4* 12.8*  RBC 5.94* 5.95* 6.17*  HGB 15.3 15.0 15.6  HCT 46.4 46.8 47.8  MCV 78.1* 78.7* 77.5*  MCH 25.8* 25.2* 25.3*  MCHC 33.0 32.1 32.6  RDW 18.0* 18.2* 18.3*  PLT 228 223 216    Cardiac EnzymesNo results for input(s): TROPONINI in the last 168 hours. No results for input(s): TROPIPOC in the last 168 hours.   BNP Recent Labs  Lab 01/11/21 1234 01/13/21 1741  BNP 438.6* 433.1*     DDimer No results for input(s): DDIMER in the last 168 hours.   Radiology    DG Chest 2 View  Result Date: 01/13/2021 CLINICAL DATA:  Pulmonary edema EXAM: CHEST - 2 VIEW COMPARISON:  01/11/2021 FINDINGS: Stable cardiomegaly. Mild vascular congestion. Mild interstitial prominence with improved aeration of the lung bases.  Possible trace bilateral pleural effusions, decreased from prior. No new focal airspace consolidation. No pneumothorax. IMPRESSION: Cardiomegaly with mild vascular congestion and probable mild interstitial edema, improved from prior. Electronically Signed   By: Davina Poke D.O.   On: 01/13/2021 16:23   CT Angio Chest PE W and/or Wo Contrast  Result Date: 01/13/2021 CLINICAL DATA:  Shortness of breath and tachycardia EXAM: CT ANGIOGRAPHY CHEST WITH CONTRAST TECHNIQUE: Multidetector CT imaging of the chest was performed using the standard protocol during bolus administration of intravenous contrast. Multiplanar CT image reconstructions and MIPs were obtained to evaluate the vascular anatomy. CONTRAST:  77mL OMNIPAQUE IOHEXOL 350 MG/ML SOLN COMPARISON:  Chest x-ray from earlier in the same day, CT from 01/04/2021. FINDINGS: Cardiovascular: Thoracic aorta is incompletely opacified. No aneurysmal dilatation is noted. No cardiac enlargement is seen. Pulmonary artery shows a normal branching pattern no filling defects are identified to suggest pulmonary embolism. Mediastinum/Nodes: Thoracic inlet is within normal limits. Scattered stable adenopathy is noted similar to that seen on prior CT examination from 10 days previous. No significant increase is identified. The esophagus is within normal limits. Lungs/Pleura: Lungs are well aerated bilaterally. Small left-sided pleural effusion is again identified. Some patchy ground-glass changes are again identified most consistent with pulmonary edema. The rounded area of airspace disease in the right upper lobe laterally has resolved consistent with postinflammatory change. Upper Abdomen: Cholecystectomy is noted. No other focal abnormality in the upper abdomen is seen. Musculoskeletal: No acute bony abnormality is noted. Changes of prior laminectomy at T5-6 are again seen and stable. Review of the MIP images confirms the above findings. IMPRESSION: No evidence of  pulmonary emboli. Stable changes of pulmonary edema similar to that noted on the prior exam. Small left pleural effusion is seen. Resolution of previously seen right upper lobe airspace opacity consistent with a postinflammatory nature on the prior exam. Stable hilar and mediastinal adenopathy unchanged from the prior study. These changes are stable dating back to September of 2021 and likely of a reactive nature. Continued follow-up in 3-6 months to assess for stability is recommended. Electronically Signed   By: Inez Catalina M.D.   On: 01/13/2021 19:39    Cardiac Studies   Echo 01/04/2021>  IMPRESSIONS  1. Technically difficult; definity used; right heart not well visualized.  2. Left ventricular ejection fraction, by estimation, is 20 to 25%. The  left ventricle has severely decreased function. The left ventricle  demonstrates global hypokinesis. The left ventricular internal cavity size  was severely dilated. Left ventricular  diastolic parameters are consistent with  Grade III diastolic dysfunction  (restrictive).  3. Right ventricular systolic function is normal. The right ventricular  size is normal.  4. The mitral valve is normal in structure. Mild mitral valve  regurgitation. No evidence of mitral stenosis.  5. The aortic valve is tricuspid. Aortic valve regurgitation is trivial.  No aortic stenosis is present.  6. The inferior vena cava is normal in size with greater than 50%  respiratory variability, suggesting right atrial pressure of 3 mmHg.   RHC/LHC, 10/14/2020>   No angiographically apparent CAD.  Personally reviewed tracings: RA 12/10/7,PA40/28/32, RV 40/7/10, LVEDP28, Ao 113/89/101.CO 4.96 L/min; CI 2.09.Ao sat 98%, PA sat 69%. No significant aortic valve gradient.  Patient Profile     55 y.o. male with PMHx of HFrEF (EF 20-25%) due to NICM, hypertension, diabetes mellitus, recent diagnosis of Hodgkin's lymphoma admitted for acute HFrEF exacerbation.    Assessment & Plan    Acute on chronic HFrEF due to NICM: Admitted with acute hypoxic respiratory failure in setting of acute on chronic HFrEF exacerbation. 1L UOP over past 24 hours with stable renal function; net -2.8L since admission, currently 111kg. Continues to require 4L O2 via [place] with minimal bibasilar crackles.  - No RHC at this time  - Continue IV Lasix 80mg  mg daily - Strict I/O, daily weights - Continue Entresto, Aldactone, Metoprolol, Digoxin and Farxiga - Monitor renal function and electrolytes - Continue cardiac monitoring - Wean oxygen as able.   Sinus tachycardia - improved  This improved with metoprolol. HR improved to 90's.  - Continue metoprolol succinate 75mg  daily  Signed, Harvie Heck, MD Internal Medicine, PGY-2 01/15/21 9:01 AM Pager # [phone]  Personally seen and examined. Agree with above.   Seems better with IV Lasix.  Continue today.  80 mg.  No need for right heart catheterization.  Continue other goal-directed therapy as above.  Minimal crackles at bases.  Candee Furbish, MD

## 2021-01-15 NOTE — Plan of Care (Signed)

## 2021-01-15 NOTE — Progress Notes (Signed)
   Subjective:   No acute overnight events.  Reports feeling good today. Has not been having any shortness of breath. Has not had a BM and wants something for it.  Objective:  Vital signs in last 24 hours: Vitals:   01/14/21 2152 01/15/21 0504 01/15/21 1148 01/15/21 1209  BP:  128/72 (!) 74/61 90/62  Pulse:  97 96   Resp:  20 20   Temp:  97.8 F (36.6 C) 98.1 F (36.7 C)   TempSrc:  Axillary    SpO2:  99% 98%   Weight: 111 kg     Height:       Physical Exam: General: obese middle-aged male, lying in bed, NAD. CV: normal rate and regular rhythm, no m/r/g. Pulm: CTABL, minimal bibasilar crackles. MSK: no peripheral edema bilaterally. Neuro: AAOx3, no focal deficits.  Assessment/Plan:  Principal Problem:   Acute on chronic heart failure with reduced ejection fraction and diastolic dysfunction (HCC) Active Problems:   Sinus tachycardia   DM2 (diabetes mellitus, type 2) (HCC)   Hodgkin's lymphoma ([place])   Acute hypoxemic respiratory failure ([place])  Scout L. Penrod is a 55 year old male with history of HFrEF (20-25%) due to nonischemic cardiomyopathy, HTN, T2DM, and Hodgkin's lymphoma (not yet started radiation therapy) admitted for acute hypoxic respiratory failure 2/2 HFrEF exacerbation.  Acute on chronic HFrEF 2/2 NICM Acute hypoxic respiratory failure Possible exacerbation from having issues with home oxygen tank and from missing home lasix doses for past 2 days prior to admission. 1L UOP overnight, will continue IV lasix 80mg  daily. Still euvolemic on exam. Cardiology following, do not recommend right heart catheterization and will instead medically manage with goal-directed medical therapy with home entresto, spironolactone, metoprolol, digoxin, and farxiga. -greatly appreciate cardiology recs -continue IV lasix 80mg  daily -continue oxygen supplementation, wean as tolerated -strict I/O's, daily standing weights -trending BMP to monitor renal function and  electrolytes  Sinus Tachycardia - resolved Possibly linked to HFrEF exacerbation. HR in 90s this AM. Doing well on metoprolol 75mg  daily, but can increase if needed.  Constipation States that he required a rectal medication during last admission to help pass a BM. Will start on miralax daily and senna daily first.  Prior to Admission Living Arrangement: Home Anticipated Discharge Location: TBD Barriers to Discharge: continued medical management Dispo: Anticipated discharge in approximately 1-2 day(s).   [name], MD 01/15/2021, 1:29 PM Pager: [phone] After 5pm on weekdays and 1pm on weekends: On Call pager [phone]

## 2021-01-15 NOTE — TOC Initial Note (Signed)
Transition of Care [facility]) - Initial/Assessment Note    Patient Details  Name: Charles [name] MRN: [id number] Date of Birth: 02/29/1966  [facility] CM/SW Contact:    Charles Chars, LCSW Phone Number: 01/15/2021, 1:57 PM  Clinical Narrative:   CSW spoke with pt to complete initial assessment.  Pt reports he lives at [facility] and that his support person is his ex girlfriend Charles [name], [phone].  Permission given to speak with her and with group home staff.  Pt reports he does have home O2 but does not recall the name of the company.  Current equipment in home: rollator.    CSW spoke with Charles [name] at [facility] who confirms pt is resident there and that they can pick him up at discharge for his return to the program.  Pt does have home O2 through [employer].                  Expected Discharge Plan: Assisted Living Barriers to Discharge: Continued Medical Work up   Patient Goals and CMS Choice Patient states their goals for this hospitalization and ongoing recovery are:: "deal with this cancer" CMS Medicare.gov Compare Post Acute Care list provided to::  (na) Choice offered to / list presented to : NA  Expected Discharge Plan and Services Expected Discharge Plan: Assisted Living     Post Acute Care Choice: NA Living arrangements for the past 2 months: [place]                                      Prior Living Arrangements/Services Living arrangements for the past 2 months: [place] Lives with:: Facility Resident Patient language and need for interpreter reviewed:: Yes Do you feel safe going back to the place where you live?: Yes      Need for Family Participation in Patient Care: Yes (Comment) Care giver support system in place?: Yes (comment) Current home services: Other (comment) (na) Criminal Activity/Legal Involvement Pertinent to Current Situation/Hospitalization: No - Comment as needed  Activities of Daily  Living      Permission Sought/Granted Permission sought to share information with : Family Supports Permission granted to share information with : Yes, Verbal Permission Granted  Share Information with NAME: ex girlfriend Charles [name], [phone]  Permission granted to share info w AGENCY: [facility]        Emotional Assessment Appearance:: Appears older than stated age Attitude/Demeanor/Rapport: Engaged Affect (typically observed): Appropriate,Pleasant Orientation: : Oriented to Self,Oriented to Place,Oriented to  Time,Oriented to Situation Alcohol / Substance Use: Not Applicable Psych Involvement: Outpatient Provider  Admission diagnosis:  Acute hypoxemic respiratory failure ([place]) [J96.01] Acute on chronic congestive heart failure, unspecified heart failure type [facility]) [I50.9] Patient Active Problem List   Diagnosis Date Noted  . Acute hypoxemic respiratory failure ([place]) 01/13/2021  . Acute on chronic heart failure with reduced ejection fraction and diastolic dysfunction ([place]) 01/12/2021  . Schizophrenia ([place]) 01/04/2021  . Diarrhea 01/04/2021  . Pulmonary edema   . Mediastinal adenopathy 12/18/2020  . Adjustment disorder with mixed anxiety and depressed mood 10/30/2020  . Mediastinal lymphadenopathy 10/11/2020  . Shortness of breath 10/11/2020  . Sinus tachycardia 10/11/2020  . Night sweats 10/11/2020  . DM2 (diabetes mellitus, type 2) ([place]) 10/11/2020  . Hodgkin's lymphoma ([place]) 10/11/2020  . Hypertension 09/25/2020  . BPH (benign prostatic hyperplasia) 09/25/2020  . Insomnia 09/25/2020   PCP:  Patient, No Pcp Per Pharmacy:   [facility] [place] [place] [address] [place] [address] Phone: [phone] Fax: [phone]     Social Determinants of Health (SDOH) Interventions    Readmission Risk Interventions Readmission Risk Prevention Plan 01/06/2021  Medication Review (RN Care Manager)  Complete  HRI or [place] Complete  SW Recovery Care/Counseling Consult Complete  [place] Not Applicable  Some recent data might be hidden

## 2021-01-16 ENCOUNTER — Other Ambulatory Visit (HOSPITAL_COMMUNITY): Payer: Self-pay | Admitting: Student

## 2021-01-16 DIAGNOSIS — R000 Tachycardia, unspecified: Secondary | ICD-10-CM | POA: Diagnosis not present

## 2021-01-16 DIAGNOSIS — I5043 Acute on chronic combined systolic (congestive) and diastolic (congestive) heart failure: Secondary | ICD-10-CM | POA: Diagnosis not present

## 2021-01-16 DIAGNOSIS — I509 Heart failure, unspecified: Secondary | ICD-10-CM | POA: Diagnosis not present

## 2021-01-16 LAB — BASIC METABOLIC PANEL
Anion gap: 10 (ref 5–15)
BUN: 19 mg/dL (ref 6–20)
CO2: 23 mmol/L (ref 22–32)
Calcium: 9.5 mg/dL (ref 8.9–10.3)
Chloride: 101 mmol/L (ref 98–111)
Creatinine, Ser: 1.01 mg/dL (ref 0.61–1.24)
GFR, Estimated: >60 mL/min (ref >60)
Glucose, Bld: 129 mg/dL — ABNORMAL HIGH (ref 70–99)
Potassium: 4.8 mmol/L (ref 3.5–5.1)
Sodium: 134 mmol/L — ABNORMAL LOW (ref 135–145)

## 2021-01-16 LAB — GLUCOSE, CAPILLARY
Glucose-Capillary: 164 mg/dL — ABNORMAL HIGH (ref 70–99)
Glucose-Capillary: 128 mg/dL — ABNORMAL HIGH (ref 70–99)

## 2021-01-16 MED ORDER — FUROSEMIDE 40 MG PO TABS: 40.0000 mg | ORAL_TABLET | Freq: Every day | ORAL | 1 refills | Status: DC

## 2021-01-16 MED ORDER — FUROSEMIDE 40 MG PO TABS
40.0000 mg | ORAL_TABLET | Freq: Every day | ORAL | Status: DC
  Administered 2021-01-16: 40 mg via ORAL
  Filled 2021-01-16: qty 1

## 2021-01-16 MED ORDER — METOPROLOL SUCCINATE ER 25 MG PO TB24: 75.0000 mg | ORAL_TABLET | Freq: Every day | ORAL | 1 refills | Status: DC

## 2021-01-16 MED ORDER — METOPROLOL SUCCINATE ER 50 MG PO TB24: 75.0000 mg | ORAL_TABLET | Freq: Every day | ORAL | 2 refills | Status: DC

## 2021-01-16 MED FILL — FUROSEMIDE 40 MG TABLET: 40 | 30 days supply | Qty: 30 | Fill #0

## 2021-01-16 MED FILL — METOPROLOL SUCCINATE ER 25: 25 | 30 days supply | Qty: 90 | Fill #0

## 2021-01-16 NOTE — Evaluation (Signed)
Occupational Therapy Evaluation Patient Details Name: Charles [name] MRN: [id number] DOB: 1966-01-07 Today's Date: 01/16/2021    History of Present Illness 55 y.o. male readmitted with acute on chronic CHF and worsening dyspnea with minimal exertion. Patient with several recent hospital admissions with most recent last week. PMHx significant for  recent diagnosis of Hodgkin's lymphoma not yet started on radiation, HTN, DM, HLD, hypothyroidism, bipolar disorder, paranoid schizophrenia, and tobacco abuse.   Clinical Impression   PTA patient was living at [facility] and was Mod I with use of rollator for functional mobility in home and community dwellings. Patient reports assist from staff with medication management and meal prep but states that he completes ADLs without external assist and use of AE. Patient currently functioning at baseline and does not require continued acute OT services at this time. Recommendation for return home with continued support from facility staff as needed.     Follow Up Recommendations  No OT follow up;Supervision - Intermittent    Equipment Recommendations  None recommended by OT    Recommendations for Other Services       Precautions / Restrictions Precautions Precautions: Fall Precaution Comments: Home O2, monitor vitals Restrictions Weight Bearing Restrictions: No      Mobility Bed Mobility               General bed mobility comments: Seated in recliner upon entry.    Transfers Overall transfer level: Needs assistance Equipment used: None Transfers: Sit to/from Stand Sit to Stand: Supervision         General transfer comment: Supervision A for safety.    Balance Overall balance assessment: Mild deficits observed, not formally tested                                         ADL either performed or assessed with clinical judgement   ADL Overall ADL's : At baseline;Modified independent                                        General ADL Comments: No physical assist required. Pt reports using sock aid for LB ADL and reacher. Pt reports sitting on shower seat for bathing tasks.     Vision Patient Visual Report: Other (comment) (States that he need glasses but does not have any.) Vision Assessment?: No apparent visual deficits     Perception     Praxis      Pertinent Vitals/Pain Pain Assessment: No/denies pain     Hand Dominance Right   Extremity/Trunk Assessment Upper Extremity Assessment Upper Extremity Assessment: Overall WFL for tasks assessed   Lower Extremity Assessment Lower Extremity Assessment: Defer to PT evaluation   Cervical / Trunk Assessment Cervical / Trunk Assessment: Normal   Communication Communication Communication: No difficulties   Cognition Arousal/Alertness: Awake/alert Behavior During Therapy: WFL for tasks assessed/performed Overall Cognitive Status: Within Functional Limits for tasks assessed                                 General Comments: Alert and oriented; takes time for processing. pt appears close to baseline   General Comments  Patient completes observed ADLs including 3/3 parts of toileting and 2/3 grooming tasks standing at sink with supervision A  for safety.    Exercises     Shoulder Instructions      Home Living Family/patient expects to be discharged to:: Assisted living ([facility]) Living Arrangements: Group Home                           Home Equipment: Walker - 4 wheels;Other (comment);Shower seat (O2)          Prior Functioning/Environment Level of Independence: Needs assistance  Gait / Transfers Assistance Needed: using Rollator PRN but wanting to transition to [facility] (ordered). ADL's / Homemaking Assistance Needed: walks to dining hall for meals, staff assist for medications, independent ADL's            OT Problem List:        OT Treatment/Interventions:       OT Goals(Current goals can be found in the care plan section) Acute Rehab OT Goals Patient Stated Goal: to go back to ALF OT Goal Formulation: All assessment and education complete, DC therapy  OT Frequency:     Barriers to D/C:            Co-evaluation              AM-PAC OT "6 Clicks" Daily Activity     Outcome Measure Help from another person eating meals?: None Help from another person taking care of personal grooming?: A Little Help from another person toileting, which includes using toliet, bedpan, or urinal?: A Little Help from another person bathing (including washing, rinsing, drying)?: A Little Help from another person to put on and taking off regular upper body clothing?: None Help from another person to put on and taking off regular lower body clothing?: A Little 6 Click Score: 20   End of Session Nurse Communication: Mobility status  Activity Tolerance: Patient tolerated treatment well Patient left: in chair;with call bell/phone within reach  OT Visit Diagnosis: Unsteadiness on feet (R26.81)                Time: 9643-8381 OT Time Calculation (min): 15 min Charges:  OT General Charges $OT Visit: 1 Visit OT Evaluation $OT Eval Low Complexity: 1 Low  Markee Matera H. OTR/L Supplemental OT, Department of rehab services [phone]  Teauna Dubach R H. 01/16/2021, 9:14 AM

## 2021-01-16 NOTE — Plan of Care (Signed)

## 2021-01-16 NOTE — Discharge Summary (Addendum)
Name: Charles [name] MRN: [id number] DOB: July 26, 1966 55 y.o. PCP: Patient, No Pcp Per  Date of Admission: 01/13/2021  5:09 PM Date of Discharge:  01/16/2021 Attending Physician: Axel Filler, *  Discharge Diagnosis: 1. Acute hypoxic respiratory failure 2/2 acute on chronic heart failure with reduced ejection fraction 2/2 nonischemic cardiomyopathy  Discharge Medications: Allergies as of 01/16/2021      Reactions   Ibuprofen    Unknown per MAR   Penicillins Rash      Medication List    TAKE these medications   acetaminophen 325 MG tablet Commonly known as: TYLENOL Take 650 mg by mouth every 6 (six) hours as needed for moderate pain.   atorvastatin 10 MG tablet Commonly known as: LIPITOR Take 10 mg by mouth daily.   blood glucose meter kit and supplies Kit Dispense based on patient and insurance preference. Use up to four times daily as directed. (FOR ICD-9 250.00, 250.01).   brimonidine 0.2 % ophthalmic solution Commonly known as: ALPHAGAN Place 1 drop into both eyes 2 (two) times daily.   cyclobenzaprine 10 MG tablet Commonly known as: FLEXERIL Take 10 mg by mouth 3 (three) times daily as needed for muscle spasms.   dapagliflozin propanediol 10 MG Tabs tablet Commonly known as: FARXIGA Take 1 tablet (10 mg total) by mouth daily.   digoxin 0.125 MG tablet Commonly known as: Lanoxin Take 1 tablet (125 mcg total) by mouth daily.   furosemide 40 MG tablet Commonly known as: LASIX Take 1 tablet (40 mg total) by mouth daily.   insulin detemir 100 UNIT/ML injection Commonly known as: LEVEMIR Inject 25 Units into the skin daily.   latanoprost 0.005 % ophthalmic solution Commonly known as: XALATAN Place 1 drop into both eyes at bedtime.   levothyroxine 100 MCG tablet Commonly known as: SYNTHROID Take 100 mcg by mouth daily before breakfast.   melatonin 3 MG Tabs tablet Take 3 mg by mouth at bedtime.   metoprolol succinate 25 MG 24 hr tablet Commonly  known as: Toprol XL Take 3 tablets (75 mg total) by mouth daily. What changed:   medication strength  additional instructions   PEN NEEDLES 29GX1/2" 29G X 12MM Misc 1 each by Does not apply route daily.   polyethylene glycol 17 g packet Commonly known as: MIRALAX / GLYCOLAX Take 17 g by mouth 2 (two) times daily.   risperiDONE 0.5 MG tablet Commonly known as: RISPERDAL Take 1 tablet (0.5 mg total) by mouth at bedtime.   sacubitril-valsartan 24-26 MG Commonly known as: ENTRESTO Take 1 tablet by mouth 2 (two) times daily.   senna 8.6 MG Tabs tablet Commonly known as: SENOKOT Take 2 tablets (17.2 mg total) by mouth 2 (two) times daily.   spironolactone 25 MG tablet Commonly known as: Aldactone Take 0.5 tablets (12.5 mg total) by mouth daily. What changed: how much to take   tamsulosin 0.4 MG Caps capsule Commonly known as: FLOMAX Take 0.4 mg by mouth daily.       Disposition and follow-up:   Charles [name] was discharged from [facility] in Stable condition.  At the hospital follow up visit please address:  1.  Acute hypoxic respiratory failure 2/2 acute on chronic heart failure with reduced ejection fraction 2/2 nonischemic cardiomyopathy. Currently euvolemic so dry weight likely close to 110.8kg. Will discharge today to [facility] (assisted living facility) with home lasix 12m daily, entresto 24-257m aldactone 2537mdigoxin 125m61mfarxiga 10mg23md metoprolol-XL 75mg 52my. Will  have him follow up at [facility] in 1 week to establish PCP.   Sinus tachycardia. Confirmed on EKG. Heart rate doing well (90s) after increasing home metoprolol-XL from 24m to 770mdaily.  2.  Labs / imaging needed at time of follow-up: CBC, BMP  3.  Pending labs/ test needing follow-up: None  Follow-up Appointments:  [facility]).   Specialty: Internal  Medicine Contact information: [id number] by problem list: 1. Acute hypoxic respiratory failure 2/2 acute on chronic heart failure with reduced ejection fraction 2/2 nonischemic cardiomyopathy. Recent admission for acute on chronic HFrEF, now readmitted to our service for persistent and worsening dyspnea with minimal exertion with increased leg swelling for past 2 days. Of note, patient reports that his oxygen tank battery ran out of charge (chronically on 2L via nasal cannula) for the past couple of days and he missed his home lasix doses for the past couple of days as well, both of which likely precipitated current presentation. Weight on admission at 111.6kg. Initially, concern for sepsis, but this was ruled out. EKG showing sinus tachycardia, which may have contributed to current presentation as well. Troponins 16 > 16. BNP 433. CT angiography chest showing stable pulmonary edema, small left pleural effusion, negative for pulmonary embolism. Cardiology consulted for further evaluation, recommended to start on IV lasix 8021maily and continued patient's home entresto, aldactone, digoxin, and farxiga. Increased patient's home metoprol-XL from 43m22m 75mg56m to sinus tachycardia, with subsequent resolution of tachycardia (see below). Clinical condition improved with diuresis and increased metoprolol dose. Worked with physical therapy, who recommended no PT follow up and intermittent supervision. [facility] Discussed with social work, they do have oxygen available for patient there. Will make sure patient has PO lasix 40mg 33mischarge to ensure continuation of maintenance diuresis.   2. Sinus tachycardia. Heart rate 110s on admission. He was on home metoprolol-XL at 43mg d37m. EKG showing sinus tachycardia. Increased  metoprolol-XL to 75mg da25mwith resolution of tachycardia.  Discharge Exam:   BP 104/62   Pulse 98   Temp 97.9 F (36.6 C) (Oral)   Resp 20   Ht _0  (1.753 m)   Wt 110.8 kg   SpO2 98%   BMI 36.08 kg/m  Discharge exam:  General: obese gentleman sitting up in chair, NAD. CV: normal rate and regular rhythm, no m/r/g. Pulm: CTABL, no respiratory distress noted, saturating >92% on room air. MSK: no peripheral edema noted bilaterally. Neuro: AAOx4, no focal deficits noted.  Pertinent Labs, Studies, and Procedures:  CBC Latest Ref Rng & Units 01/14/2021 01/13/2021 01/11/2021  WBC 4.0 - 10.5 K/uL 12.8(H) 12.4(H) 10.8(H)  Hemoglobin 13.0 - 17.0 g/dL 15.6 15.0 15.3  Hematocrit 39.0 - 52.0 % 47.8 46.8 46.4  Platelets 150 - 400 K/uL 216 223 228   CMP Latest Ref Rng & Units 01/16/2021 01/15/2021 01/14/2021  Glucose 70 - 99 mg/dL 129(H) 121(H) 149(H)  BUN 6 - 20 mg/dL _1 Creatinine 0.61 - 1.24 mg/dL 1.01 1.15 1.07  Sodium 135 - 145 mmol/L 134(L) 136 134(L)  Potassium 3.5 - 5.1 mmol/L 4.8 4.1 3.6  Chloride 98 - 111 mmol/L 101 101 100  CO2 22 - 32 mmol/L 23  24 22  Calcium 8.9 - 10.3 mg/dL 9.5 9.5 9.5  Total Protein 6.5 - 8.1 g/dL - - -  Total Bilirubin 0.3 - 1.2 mg/dL - - -  Alkaline Phos 38 - 126 U/L - - -  AST 15 - 41 U/L - - -  ALT 0 - 44 U/L - - -   BNP    Component Value Date/Time   BNP 433.1 (H) 01/13/2021 1741   Urinalysis    Component Value Date/Time   COLORURINE YELLOW 01/13/2021 1818   APPEARANCEUR CLEAR 01/13/2021 1818   LABSPEC 1.013 01/13/2021 1818   PHURINE 5.0 01/13/2021 1818   GLUCOSEU NEGATIVE 01/13/2021 1818   HGBUR NEGATIVE 01/13/2021 1818   BILIRUBINUR NEGATIVE 01/13/2021 1818   KETONESUR NEGATIVE 01/13/2021 1818   PROTEINUR NEGATIVE 01/13/2021 1818   UROBILINOGEN 1.0 11/09/2011 1522   NITRITE NEGATIVE 01/13/2021 1818   LEUKOCYTESUR NEGATIVE 01/13/2021 1818   Troponin I (High Sensitivity) - 16 > 16  Digoxin level - 0.2   Discharge  Instructions: Discharge Instructions    (HEART FAILURE PATIENTS) Call MD:  Anytime you have any of the following symptoms: 1) 3 pound weight gain in 24 hours or 5 pounds in 1 week 2) shortness of breath, with or without a dry hacking cough 3) swelling in the hands, feet or stomach 4) if you have to sleep on extra pillows at night in order to breathe.   Complete by: As directed    Call MD for:  difficulty breathing, headache or visual disturbances   Complete by: As directed    Call MD for:  extreme fatigue   Complete by: As directed    Call MD for:  hives   Complete by: As directed    Call MD for:  persistant dizziness or light-headedness   Complete by: As directed    Call MD for:  persistant nausea and vomiting   Complete by: As directed    Call MD for:  redness, tenderness, or signs of infection (pain, swelling, redness, odor or green/yellow discharge around incision site)   Complete by: As directed    Call MD for:  severe uncontrolled pain   Complete by: As directed    Call MD for:  temperature >100.4   Complete by: As directed    Diet - low sodium heart healthy   Complete by: As directed    Discharge instructions   Complete by: As directed    Hi Charles [name], it was a pleasure taking care of you during your time here.  Please take note of the following:  1. Please make sure to take your lasix (water pill) every day as prescribed.  2. Please make sure to use your home oxygen as needed, especially at night and with walking around.  3. Please make sure to go to your clinic visit on 01/21/2021 to see your cancer doctor. This is very important.  4. I will schedule you to come to our clinic ([facility]) in 1 week. Someone will call you to schedule this appointment.   Increase activity slowly   Complete by: As directed    No wound care   Complete by: As directed       Signed: [name], MD 01/16/2021, 11:51 AM   Pager: [phone]

## 2021-01-16 NOTE — Evaluation (Signed)
Physical Therapy Evaluation Patient Details Name: Charles [name] MRN: [id number] DOB: 03-26-1966 Today's Date: 01/16/2021   History of Present Illness  55 y.o. male readmitted with acute on chronic CHF and worsening dyspnea with minimal exertion. Patient with several recent hospital admissions with most recent last week. PMHx significant for  recent diagnosis of Hodgkin's lymphoma not yet started on radiation, HTN, DM, HLD, hypothyroidism, bipolar disorder, paranoid schizophrenia, and tobacco abuse.  Clinical Impression  Patient presents with generalized weakness, decreased activity tolerance and impaired mobility s/p above. Pt lives at an ALF and reports being Mod I for ADLs PTA. Pt has been using rollator but reports he is ready to transition to a cane and it has already been ordered per report. Today, pt tolerated transfers and gait training with supervision for safety; reaching for rail for support at times in hallway. Declined use of RW today. 2 standing rest breaks due to LE tightness/fatigue. SP02 remained >95% on RA throughout activity. Left on RA. Education re: energy conservation techniques, when to donn 02 etc. Encouraged walking with nursing a few times daily to improve overall mobility/strength and endurance prior to d/c. Will follow acutely.    Follow Up Recommendations No PT follow up;Supervision - Intermittent    Equipment Recommendations  None recommended by PT    Recommendations for Other Services       Precautions / Restrictions Precautions Precautions: Fall;Other (comment) Precaution Comments: watch 02/HR Restrictions Weight Bearing Restrictions: No      Mobility  Bed Mobility               General bed mobility comments: Seated in recliner upon entry.    Transfers Overall transfer level: Needs assistance Equipment used: None Transfers: Sit to/from Stand Sit to Stand: Supervision         General transfer comment: Supervision for safety. Stood Chiropractor. Reaching for counter for support.  Ambulation/Gait Ambulation/Gait assistance: Supervision Gait Distance (Feet): 150 Feet Assistive device:  (rail in hallway) Gait Pattern/deviations: Step-through pattern;Decreased stride length;Trunk flexed Gait velocity: decreased Gait velocity interpretation: <1.31 ft/sec, indicative of household ambulator General Gait Details: Slow, mildly unsteady gait reaching for rail for support at times; cues for upright posture. Pt declined using RW. 2 standing rest breaks. Reports tightness in LEs with increased distance. Sp02 stayed >95% on RA.  Stairs            Wheelchair Mobility    Modified Rankin (Stroke Patients Only)       Balance Overall balance assessment: Mild deficits observed, not formally tested                                           Pertinent Vitals/Pain Pain Assessment: No/denies pain    Home Living Family/patient expects to be discharged to:: Assisted living ([facility]) Living Arrangements: Group Home             Home Equipment: Environmental consultant - 4 wheels;Other (comment);Shower seat Additional Comments: 02    Prior Function Level of Independence: Needs assistance   Gait / Transfers Assistance Needed: using Rollator PRN but wanting to transition to [facility] (ordered).  ADL's / Homemaking Assistance Needed: walks to dining hall for meals, staff assist for medications, independent ADL's  Comments: No falls reported but a few almost falls per report.     Hand Dominance   Dominant Hand: Right    Extremity/Trunk  Assessment   Upper Extremity Assessment Upper Extremity Assessment: Defer to OT evaluation    Lower Extremity Assessment Lower Extremity Assessment: Generalized weakness (but functional -reports hx of back pain resulting in LE weakness)    Cervical / Trunk Assessment Cervical / Trunk Assessment: Normal  Communication   Communication: No difficulties  Cognition Arousal/Alertness:  Awake/alert Behavior During Therapy: WFL for tasks assessed/performed Overall Cognitive Status: Within Functional Limits for tasks assessed                                 General Comments: Alert and oriented; takes time for processing. pt appears close to baseline      General Comments General comments (skin integrity, edema, etc.): Sp02 remained >95% on RA throughout activity. Left on RA post session. RN aware.    Exercises     Assessment/Plan    PT Assessment Patient needs continued PT services  PT Problem List Decreased activity tolerance;Decreased balance;Decreased mobility       PT Treatment Interventions DME instruction;Gait training;Therapeutic activities;Functional mobility training;Therapeutic exercise;Balance training;Patient/family education    PT Goals (Current goals can be found in the Care Plan section)  Acute Rehab PT Goals Patient Stated Goal: to go home and feel better PT Goal Formulation: With patient Time For Goal Achievement: 01/30/21 Potential to Achieve Goals: Good    Frequency Min 3X/week   Barriers to discharge        Co-evaluation               AM-PAC PT "6 Clicks" Mobility  Outcome Measure Help needed turning from your back to your side while in a flat bed without using bedrails?: None Help needed moving from lying on your back to sitting on the side of a flat bed without using bedrails?: None Help needed moving to and from a bed to a chair (including a wheelchair)?: None Help needed standing up from a chair using your arms (e.g., wheelchair or bedside chair)?: A Little Help needed to walk in hospital room?: A Little Help needed climbing 3-5 steps with a railing? : A Little 6 Click Score: 21    End of Session Equipment Utilized During Treatment: Gait belt Activity Tolerance: Patient tolerated treatment well Patient left: in chair;with call bell/phone within reach Nurse Communication: Mobility status PT Visit  Diagnosis: Difficulty in walking, not elsewhere classified (R26.2)    Time: 2951-8841 PT Time Calculation (min) (ACUTE ONLY): 18 min   Charges:   PT Evaluation $PT Eval Moderate Complexity: 1 Mod          Marisa Severin, PT, DPT Acute Rehabilitation Services Pager [phone] Office [phone]      Marguarite Arbour A [place] 01/16/2021, 10:27 AM

## 2021-01-16 NOTE — Progress Notes (Addendum)
Progress Note  Patient Name: Charles [name] Date of Encounter: 01/16/2021  Primary Cardiologist: Dr [name], MD  Subjective   No acute overnight events reported. Mr Gerrard Crystal is sitting up in bedside chair on exam. He reports feeling well. He was able to walk around the unit without any significant dyspnea. He is on room air.   Inpatient Medications    Scheduled Meds: . atorvastatin  10 mg Oral Daily  . brimonidine  1 drop Both Eyes BID  . dapagliflozin propanediol  10 mg Oral Daily  . digoxin  125 mcg Oral Daily  . enoxaparin (LOVENOX) injection  40 mg Subcutaneous Q24H  . furosemide  80 mg Intravenous Daily  . insulin aspart  0-15 Units Subcutaneous TID WC  . latanoprost  1 drop Both Eyes QHS  . levothyroxine  100 mcg Oral Q0600  . melatonin  3 mg Oral QHS  . metoprolol succinate  75 mg Oral Daily  . polyethylene glycol  17 g Oral Daily  . potassium chloride  40 mEq Oral BID  . risperiDONE  0.5 mg Oral QHS  . sacubitril-valsartan  1 tablet Oral BID  . senna  1 tablet Oral Daily  . spironolactone  12.5 mg Oral Daily  . tamsulosin  0.4 mg Oral Daily   Continuous Infusions:  PRN Meds: acetaminophen **OR** acetaminophen, ondansetron **OR** ondansetron (ZOFRAN) IV   Vital Signs    Vitals:   01/15/21 1209 01/15/21 2100 01/16/21 0551 01/16/21 0553  BP: 90/62 94/77 98/64    Pulse:  98 92   Resp:  18 20   Temp:  98.5 F (36.9 C) 97.8 F (36.6 C)   TempSrc:   Oral   SpO2:  100% 97%   Weight:    110.8 kg  Height:        Intake/Output Summary (Last 24 hours) at 01/16/2021 0757 Last data filed at 01/16/2021 0554 Gross per 24 hour  Intake 218 ml  Output 2050 ml  Net -1832 ml   Filed Weights   01/13/21 1944 01/14/21 2152 01/16/21 0553  Weight: 111.6 kg 111 kg 110.8 kg    Telemetry    NSR, HR in 90's - Personally Reviewed  Physical Exam  Physical Exam  Constitutional: Appears well-developed and well-nourished. No distress.  HENT: Normocephalic  and atraumatic, EOMI, conjunctiva normal, moist mucous membranes Cardiovascular: Normal rate, regular rhythm, S1 and S2 present, no murmurs, rubs, gallops.  Distal pulses intact Respiratory: No respiratory distress, no accessory muscle use.  Effort is normal.  Lungs are clear to auscultation bilaterally. GI: Nondistended, soft, nontender to palpation, active bowel sounds Musculoskeletal: Normal bulk and tone.  Trace bilateral lower extremity edema noted Neurological: Is alert and oriented x4, no apparent focal deficits noted. Skin: Warm and dry.  No rash, erythema, lesions noted.  Labs    Chemistry Recent Labs  Lab 01/11/21 1142 01/13/21 1720 01/14/21 0241 01/15/21 0704  NA 134* 134* 134* 136  K 4.2 4.3 3.6 4.1  CL 105 105 100 101  CO2 20* 20* 22 24  GLUCOSE 114* 120* 149* 121*  BUN 10 13 11 14   CREATININE 1.14 1.14 1.07 1.15  CALCIUM 9.1 9.5 9.5 9.5  PROT 7.2  --   --   --   ALBUMIN 3.5  --   --   --   AST 21  --   --   --   ALT 23  --   --   --   ALKPHOS 105  --   --   --  BILITOT 0.4  --   --   --   GFRNONAA >60 >60 >60 >60  ANIONGAP 9 9 12 11      Hematology Recent Labs  Lab 01/11/21 1142 01/13/21 1720 01/14/21 0241  WBC 10.8* 12.4* 12.8*  RBC 5.94* 5.95* 6.17*  HGB 15.3 15.0 15.6  HCT 46.4 46.8 47.8  MCV 78.1* 78.7* 77.5*  MCH 25.8* 25.2* 25.3*  MCHC 33.0 32.1 32.6  RDW 18.0* 18.2* 18.3*  PLT 228 223 216    Cardiac EnzymesNo results for input(s): TROPONINI in the last 168 hours. No results for input(s): TROPIPOC in the last 168 hours.   BNP Recent Labs  Lab 01/11/21 1234 01/13/21 1741  BNP 438.6* 433.1*     DDimer No results for input(s): DDIMER in the last 168 hours.   Radiology    No results found.  Cardiac Studies   Echo 01/04/2021> IMPRESSIONS  1. Technically difficult; definity used; right heart not well visualized.  2. Left ventricular ejection fraction, by estimation, is 20 to 25%. The  left ventricle has severely decreased  function. The left ventricle  demonstrates global hypokinesis. The left ventricular internal cavity size  was severely dilated. Left ventricular  diastolic parameters are consistent with Grade III diastolic dysfunction  (restrictive).  3. Right ventricular systolic function is normal. The right ventricular  size is normal.  4. The mitral valve is normal in structure. Mild mitral valve  regurgitation. No evidence of mitral stenosis.  5. The aortic valve is tricuspid. Aortic valve regurgitation is trivial.  No aortic stenosis is present.  6. The inferior vena cava is normal in size with greater than 50%  respiratory variability, suggesting right atrial pressure of 3 mmHg.   RHC/LHC, 10/14/2020>  No angiographically apparent CAD.  Personally reviewed tracings: RA 12/10/7,PA40/28/32, RV 40/7/10, LVEDP28, Ao 113/89/101.CO 4.96 L/min; CI 2.09.Ao sat 98%, PA sat 69%. No significant aortic valve gradient.   Patient Profile     55 y.o. male  with PMHx of HFrEF (EF 20-25%) due to NICM, hypertension, diabetes mellitus, recent diagnosis of Hodgkin's lymphoma admitted for acute HFrEF exacerbation.   Assessment & Plan    Acute on chronic HFrEF due to NICM: Admitted with acute hypoxic respiratory failure in setting of acute on chronic HFrEF exacerbation. 2L UOP over past 24 hours with stable renal function; net -4.6L since admission, currently 110.8kg. Oxygen requirement improved and he is currently on room air. - Strict I/O, daily weights - Switch to Lasix 40mg  daily - Continue Entresto, Aldactone, Metoprolol, Digoxin and Farxiga - Monitor renal function and electrolytes  Sinus tachycardia - improved  This improved with metoprolol. HR improved to 90's.  - Continue metoprolol succinate 75mg  daily  Signed, Harvie Heck, MD Internal Medicine, PGY-2 01/16/21 8:48 AM Pager # [phone]  Personally seen and examined. Agree with above.   Feels much better.  Transition him to  Lasix p.o. 40 mg once a day.  He is also on Entresto as well as [place] and spironolactone.  Medications have been resumed.  Blood pressure is somewhat soft but he is asymptomatic with this.  Obviously of dizziness occurs, may need to decrease standing Lasix.  Okay for discharge.  Candee Furbish, MD

## 2021-01-16 NOTE — TOC Transition Note (Signed)
Transition of Care [facility]) - CM/SW Discharge Note   Patient Details  Name: Charles [name] MRN: [id number] Date of Birth: 1966-01-15  Transition of Care [facility]) CM/SW Contact:  Joanne Chars, LCSW Phone Number: 01/16/2021, 12:17 PM   Clinical Narrative:  Pt discharging back to [facility] group home/ALF.  Kellie Simmering to pick pt up.  No HH/DME needs.  O2 in place with [employer] and Kellie Simmering will bring O2 for transport home.       Final next level of care: Assisted Living Barriers to Discharge: Barriers Resolved   Patient Goals and CMS Choice Patient states their goals for this hospitalization and ongoing recovery are:: "deal with this cancer" CMS Medicare.gov Compare Post Acute Care list provided to::  (na) Choice offered to / list presented to : NA  Discharge Placement                       Discharge Plan and Services     Post Acute Care Choice: NA          DME Arranged: N/A         HH Arranged: NA HH Agency: NA        Social Determinants of Health (SDOH) Interventions     Readmission Risk Interventions Readmission Risk Prevention Plan 01/06/2021  Medication Review ([place]) Complete  HRI or [place] Complete  SW Recovery Care/Counseling Consult Complete  Palliative Care Screening Not [place] Not Applicable  Some recent data might be hidden

## 2021-01-16 NOTE — TOC Progression Note (Signed)
Transition of Care [facility]) - Progression Note    Patient Details  Name: Charles [name] MRN: [id number] Date of Birth: 01/15/1966  Transition of Care [facility]) CM/SW Contact  Joanne Chars, LCSW Phone Number: 01/16/2021, 11:18 AM  Clinical Narrative:   CSW informed by MD that pt ready for DC today.  CSW spoke with Jamari at [facility] and they can provide transportation home and can bring pt O2.  No FL2 needed, just discharge summary.      Expected Discharge Plan: Assisted Living Barriers to Discharge: Continued Medical Work up  Expected Discharge Plan and Services Expected Discharge Plan: Assisted Living     Post Acute Care Choice: NA Living arrangements for the past 2 months: [place]                                       Social Determinants of Health (SDOH) Interventions    Readmission Risk Interventions Readmission Risk Prevention Plan 01/06/2021  Medication Review (RN Care Manager) Complete  HRI or [place] Complete  SW Recovery Care/Counseling Consult Complete  [place] Not Applicable  Some recent data might be hidden

## 2021-01-20 NOTE — Progress Notes (Signed)
Lymphoma Location(s) / Histology:  Nodular Lymphocyte Predominate Hodgkin Lymphoma, Stage 1A  Charles [name] presented with symptoms of: On 09/13/2020 the patient underwent a PET CT scan which showed prevascular adenopathy with individual nodules measuring up to 2.4 cm in diameter with a maximum SUV of 6.4.  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 Due to concern for this mediastinal lymph node and a possible coagulopathy the patient was referred to hematology (Dr. Narda Rutherford) for further evaluation and management.  CT Angio Chest x &w/o Contrast 01/13/2021 IMPRESSION: --No evidence of pulmonary emboli.Stable changes of pulmonary edema similar to that noted on the prior exam. Small left pleural effusion is seen. --Resolution of previously seen right upper lobe airspace opacity consistent with a postinflammatory nature on the prior exam. --Stable hilar and mediastinal adenopathy unchanged from the priorstudy. These changes are stable dating back to September of 2021 and likely of a reactive nature. Continued follow-up in 3-6 months to assess for stability is recommended.  CT Angio Chest w &w/o Contrast 01/04/2021 IMPRESSION: 1. Despite the limitations of today's examination, there is no evidence to suggest clinically significant central, lobar or segmental sized pulmonary embolism. 2. Cardiomegaly with left ventricular dilatation, evidence of pulmonary edema and small to moderate bilateral pleural effusions; imaging findings concerning for congestive heart failure. 3. More nodular focus of probable airspace consolidation in the periphery of the right upper lobe measuring 2.3 x 1.6 cm. This is favored to be of infectious or inflammatory etiology. However, attention on short-term follow-up noncontrast chest CT isrecommended in 3 months to ensure complete resolution of this finding, as the possibility of neoplasm is not entirely excluded. At the time of  the follow-up imaging, attention should also be directed to the mediastinal lymphadenopathy noted on today's examination. This lymphadenopathy is nonspecific, and can be seen in the setting of congestive heart failure, however, the possibility of lymphoproliferative disorder or other neoplasm warrants of exclusion. 4. Aortic atherosclerosis.  Biopsies revealed:  12/18/2020 FINAL MICROSCOPIC DIAGNOSIS:  A. 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  - Thymic tissue present.  COMMENT:  The majority of the fragments consist of lymph node tissue with partial effacement of the architecture by a vaguely nodular lymphoid proliferation. The nodular areas consist of B-cell rich aggregates (CD20, CD79a, PAX5, bcl-2, positive; CD10 negative) with associated expanded VQ25 follicular dendritic networks. Within the B-cell areas there are large atypical lymphoid cells with folded nuclei (LP or popcorn cells) which are positive for CD45, CD20, PAX5, CD79a, bcl-6, and MUM1. Scattered cells are also seen surrounding the B-cell areas. These atypical lymphocytes exhibit T-cell rosettes (CD3, PD1, CD57). The larger atypical lymphocytes are negative for CD15, EMA, and essentially negative for CD30 (rare weak staining in atypical lymphocytes, positive in scattered activated cells). There are abundant surrounding T-cells (CD3, CD5, CD43, CD4 > CD8). Ki-67 is increased in some areas. In some areas there are preserved discrete reactive appearing follicles (ZD63, PAX5, OV56E, bcl-6, CD21 positive; bcl-2 negative). CD138 highlights peripheral plasma cells which are polytypic by light chain in situ hybridization. EBV is positive in a few small B-cells. There are a few fragments of thymic tissue with focal cholesterol nodules and focal epithelium suggestive of a benign cyst. The surrounding thymic tissue has reactive appearing lymphoid follicles suggestive of  follicular hyperplasia. Flow cytometry ([id number]) is negative for a monoclonal B-cell population. The overall findings are consistent with partial  involvement by nodular lymphocyte predominant Hodgkin lymphoma.  Past/Anticipated interventions by medical oncology, if any:  Under care of Dr. Narda Rutherford 01/01/2021 --Findings at this time are most consistent with a stage Ia nodular lymphocyte predominant Hodgkin's lymphoma. --Last PET CT scan performed on this patient was in November 2021.  Discussed with radiation oncology to see if they would prefer a repeat PET CT scan prior to initiation of therapy. --Recommend referral to radiation oncology for definitive treatment of this lymphoma. --In the event that the patient is found not to be a candidate for radiation therapy we would recommend consideration of chemotherapy/immunotherapy. --Return to clinic pending discussion with radiation oncology.  Weight changes, if any, over the past 6 months: Patient denies  Wt Readings from Last 3 Encounters:  01/21/21 249 lb (112.9 kg)  01/16/21 244 lb 4.8 oz (110.8 kg)  01/13/21 246 lb (111.6 kg)    Recurrent fevers, or drenching night sweats, if any: Patient denies  SAFETY ISSUES:  Prior radiation? No  Pacemaker/ICD? No  Possible current pregnancy? N/A  Is the patient on methotrexate? No  Current Complaints / other details:   Patient reports he has received the first two COVID-19 vaccines at his facility this past January

## 2021-01-21 ENCOUNTER — Ambulatory Visit
Admission: RE | Admit: 2021-01-21 | Discharge: 2021-01-21 | Disposition: A | Discharge status: Home / Self Care | Payer: Medicaid Other | Source: Ambulatory Visit | Attending: Radiation Oncology | Admitting: Radiation Oncology

## 2021-01-21 ENCOUNTER — Other Ambulatory Visit: Payer: Self-pay

## 2021-01-21 ENCOUNTER — Encounter: Payer: Self-pay | Admitting: Radiation Oncology

## 2021-01-21 VITALS — BP 108/76 | HR 111 | Temp 96.6°F | Resp 20 | Ht 69.0 in | Wt 249.0 lb

## 2021-01-21 DIAGNOSIS — Z79899 Other long term (current) drug therapy: Secondary | ICD-10-CM | POA: Insufficient documentation

## 2021-01-21 DIAGNOSIS — J90 Pleural effusion, not elsewhere classified: Secondary | ICD-10-CM | POA: Insufficient documentation

## 2021-01-21 DIAGNOSIS — F17210 Nicotine dependence, cigarettes, uncomplicated: Secondary | ICD-10-CM | POA: Diagnosis not present

## 2021-01-21 DIAGNOSIS — I083 Combined rheumatic disorders of mitral, aortic and tricuspid valves: Secondary | ICD-10-CM | POA: Insufficient documentation

## 2021-01-21 DIAGNOSIS — I252 Old myocardial infarction: Secondary | ICD-10-CM | POA: Insufficient documentation

## 2021-01-21 DIAGNOSIS — M47814 Spondylosis without myelopathy or radiculopathy, thoracic region: Secondary | ICD-10-CM | POA: Insufficient documentation

## 2021-01-21 DIAGNOSIS — F209 Schizophrenia, unspecified: Secondary | ICD-10-CM | POA: Insufficient documentation

## 2021-01-21 DIAGNOSIS — Z794 Long term (current) use of insulin: Secondary | ICD-10-CM | POA: Diagnosis not present

## 2021-01-21 DIAGNOSIS — I429 Cardiomyopathy, unspecified: Secondary | ICD-10-CM | POA: Insufficient documentation

## 2021-01-21 DIAGNOSIS — Z8616 Personal history of COVID-19: Secondary | ICD-10-CM | POA: Insufficient documentation

## 2021-01-21 DIAGNOSIS — I700 Atherosclerosis of aorta: Secondary | ICD-10-CM | POA: Insufficient documentation

## 2021-01-21 DIAGNOSIS — C8102 Nodular lymphocyte predominant Hodgkin lymphoma, intrathoracic lymph nodes: Secondary | ICD-10-CM

## 2021-01-21 DIAGNOSIS — C8112 Nodular sclerosis classical Hodgkin lymphoma, intrathoracic lymph nodes: Secondary | ICD-10-CM

## 2021-01-21 DIAGNOSIS — C8101 Nodular lymphocyte predominant Hodgkin lymphoma, lymph nodes of head, face, and neck: Secondary | ICD-10-CM

## 2021-01-21 DIAGNOSIS — I509 Heart failure, unspecified: Secondary | ICD-10-CM | POA: Diagnosis not present

## 2021-01-21 DIAGNOSIS — I110 Hypertensive heart disease with heart failure: Secondary | ICD-10-CM | POA: Insufficient documentation

## 2021-01-21 DIAGNOSIS — E119 Type 2 diabetes mellitus without complications: Secondary | ICD-10-CM | POA: Insufficient documentation

## 2021-01-21 LAB — FUNGUS CULTURE WITH STAIN

## 2021-01-21 LAB — FUNGUS CULTURE RESULT

## 2021-01-21 LAB — FUNGAL ORGANISM REFLEX

## 2021-01-21 NOTE — Progress Notes (Signed)
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 male who presented with thoracic adenopathy in November of 2021.  He denies any weight loss, fevers, or drenching night sweats.  He was referred to Dr. Lorenso Courier of hematology oncology for work-up.  Pertinent imaging studies thus far include: 1. PET scan on 09/23/2020 that showed pre-vascular adenopathy with individual nodes measuring up to 2.4 cm in diameter. The relatively high activity raised concern for mediastinal lymphoma. There was no adenopathy or suspicious hypermetabolic activity in the neck, abdomen, pelvis, or skeleton. 2. CTA of chest on 10/10/2020 that showed worsening mediastinal adenopathy. 3. CTA of chest on 11/29/2020 that showed basilar predominant interstitial and ground-glass opacities most consistent with COVID-19 pneumonia given clinical history. There was also noted to be mediastinal and hilar lymphadenopathy, which may have been a combination of known  underlying lymphoma with superimpose reactive change given pneumonia. 4. CTA of chest on 01/04/2021 that showed a more nodular focus of probable airspace consolidation in the periphery of the right upper lobe that measured 2.3 x 1.6 cm and was favored to be of infectious or inflammatory etiology. However, neoplasm could not be entirely excluded. Mediastinal lymphadenopathy was once again noted, but was considered non-specific and could also be seen in the setting of CHF, however, the possibility of lymphoproliferative disorder or other neoplasm could not be excluded. 5. CTA of chest on 01/13/2021 that showed stable changes of pulmonary edema, small left pleural effusion, resolution of previously seen right upper lobe airspace opacity consistent with post-inflammatory nature, and stable hilar and mediastinal adenopathy.  The patient underwent flow cytometry on 09/25/2020 that showed no monclonal B-cell population or significant T-cell abnormalities.  The patient underwent a robotic-assisted left video-assisted thoracoscopy for biopsy of aorto-pulmonary window lymph nodes on the date of 12/18/2020. Pathology from the procedure revealed partial involvement by nodular lymphocyte predominant Hodgkin lymphoma.  He lives in assisted living.  He does not have any family contacts.  He reports that when needed his ex-girlfriend.  He likes to watch television, and prefers shows about the police force.  Past medical history is notable for Covid in December, diabetes, hypertension, schizophrenia, bipolar disorder, congestive heart failure, cardiomyopathy, myocardial infarction  PREVIOUS RADIATION THERAPY: No  PAST MEDICAL HISTORY:  has a past medical history of Arthritis, Bipolar 1 disorder ([place]), Cardiomyopathy, nonischemic ([place]), CHF (congestive heart failure) ([place]), COVID (10/2020), Depression, DM2 (diabetes mellitus, type 2) ([place]), HTN (hypertension), and Myocardial infarction ([place]).    PAST SURGICAL  HISTORY: Past Surgical History:  Procedure Laterality Date  . BACK SURGERY  01/2020  .  CARDIAC CATHETERIZATION  10/2020  . CHOLECYSTECTOMY    . HERNIA REPAIR    . INTERCOSTAL NERVE BLOCK Left 12/18/2020   Procedure: INTERCOSTAL NERVE BLOCK;  Surgeon: Melrose Nakayama, MD;  Location: [place];  Service: Thoracic;  Laterality: Left;  . LYMPH NODE BIOPSY Left 12/18/2020   Procedure: LYMPH NODE BIOPSY;  Surgeon: Melrose Nakayama, MD;  Location: [place];  Service: Thoracic;  Laterality: Left;  . RIGHT/LEFT HEART CATH AND CORONARY ANGIOGRAPHY N/A 10/14/2020   Procedure: RIGHT/LEFT HEART CATH AND CORONARY ANGIOGRAPHY;  Surgeon: Jettie Booze, MD;  Location: [facility];  Service: Cardiovascular;  Laterality: N/A;  . SPINAL FIXATION SURGERY  02/26/2020  . XI ROBOTIC ASSISTED THORASCOPY FOR BIOPSY AP WINDOW LYMPH NODES (Left)  12/18/2020    FAMILY HISTORY: family history includes Cancer in his mother; Healthy in his sister; Hyperlipidemia in his father and mother; Hypertension in his father and mother; Stroke in his mother.  SOCIAL HISTORY:  reports that he has been smoking cigarettes. He has been smoking about 0.25 packs per day. He has never used smokeless tobacco. He reports previous drug use. Drug: Cocaine. He reports that he does not drink alcohol.  ALLERGIES: Ibuprofen and Penicillins  MEDICATIONS:  Current Outpatient Medications  Medication Sig Dispense Refill  . acetaminophen (TYLENOL) 325 MG tablet Take 650 mg by mouth every 6 (six) hours as needed for moderate pain.    Marland Kitchen atorvastatin (LIPITOR) 10 MG tablet Take 10 mg by mouth daily.    . blood glucose meter kit and supplies KIT Dispense based on patient and insurance preference. Use up to four times daily as directed. (FOR ICD-9 250.00, 250.01). 1 each 0  . brimonidine (ALPHAGAN) 0.2 % ophthalmic solution Place 1 drop into both eyes 2 (two) times daily.     . cyclobenzaprine (FLEXERIL) 10 MG tablet Take 10 mg by mouth 3  (three) times daily as needed for muscle spasms.    . dapagliflozin propanediol (FARXIGA) 10 MG TABS tablet Take 1 tablet (10 mg total) by mouth daily. 30 tablet 3  . digoxin (LANOXIN) 0.125 MG tablet Take 1 tablet (125 mcg total) by mouth daily. 30 tablet 11  . furosemide (LASIX) 40 MG tablet Take 1 tablet (40 mg total) by mouth daily. 30 tablet 1  . insulin detemir (LEVEMIR) 100 UNIT/ML injection Inject 25 Units into the skin daily.    . Insulin Pen Needle (PEN NEEDLES 29GX1/2") 29G X 12MM MISC 1 each by Does not apply route daily. 100 each 0  . latanoprost (XALATAN) 0.005 % ophthalmic solution Place 1 drop into both eyes at bedtime.     Marland Kitchen levothyroxine (SYNTHROID) 100 MCG tablet Take 100 mcg by mouth daily before breakfast.    . melatonin 3 MG TABS tablet Take 3 mg by mouth at bedtime.    . metoprolol succinate (TOPROL XL) 25 MG 24 hr tablet Take 3 tablets (75 mg total) by mouth daily. 90 tablet 1  . polyethylene glycol (MIRALAX / GLYCOLAX) 17 g packet Take 17 g by mouth 2 (two) times daily. 14 each 0  . risperiDONE (RISPERDAL) 0.5 MG tablet Take 1 tablet (0.5 mg total) by mouth at bedtime. 30 tablet 0  . sacubitril-valsartan (ENTRESTO) 24-26 MG Take 1 tablet by mouth 2 (two) times daily.    Marland Kitchen senna (SENOKOT) 8.6 MG TABS tablet Take 2 tablets (17.2 mg total) by mouth 2 (two) times daily. 120 tablet 0  . spironolactone (ALDACTONE) 25 MG tablet Take 0.5  tablets (12.5 mg total) by mouth daily. (Patient taking differently: Take 25 mg by mouth daily.) 30 tablet 0  . tamsulosin (FLOMAX) 0.4 MG CAPS capsule Take 0.4 mg by mouth daily.     No current facility-administered medications for this encounter.    REVIEW OF SYSTEMS:  Notable for that above.   PHYSICAL EXAM:  height is 5' 9" (1.753 m) and weight is 249 lb (112.9 kg). His temporal temperature is 96.6 F (35.9 C) (abnormal). His blood pressure is 108/76 and his pulse is 111 (abnormal). His respiration is 20 and oxygen saturation is 98%.    General: Alert and oriented, in no acute distress - oriented to date, place, self, current president HEENT: Head is normocephalic. Heart: Slightly tachycardic, regular in rhythm with no murmurs, rubs, or gallops. (Noted that previous pulse rates in record ranged similar to today ) Chest: Clear to auscultation bilaterally, with no rhonchi, wheezes, or rales. Abdomen: Soft, nontender, nondistended, with no rigidity or guarding. Musculoskeletal: Ambulatory with a walker    ECOG = 3  0 - Asymptomatic (Fully active, able to carry on all predisease activities without restriction)  1 - Symptomatic but completely ambulatory (Restricted in physically strenuous activity but ambulatory and able to carry out work of a light or sedentary nature. For example, light housework, office work)  2 - Symptomatic, <50% in bed during the day (Ambulatory and capable of all self care but unable to carry out any work activities. Up and about more than 50% of waking hours)  3 - Symptomatic, >50% in bed, but not bedbound (Capable of only limited self-care, confined to bed or chair 50% or more of waking hours)  4 - Bedbound (Completely disabled. Cannot carry on any self-care. Totally confined to bed or chair)  5 - Death   Eustace Pen MM, Creech RH, Tormey DC, et al. [phone]). "Toxicity and response criteria of the [facility]". [place] Oncol. 5 (6): 649-55   LABORATORY DATA:  Lab Results  Component Value Date   WBC 12.8 (H) 01/14/2021   HGB 15.6 01/14/2021   HCT 47.8 01/14/2021   MCV 77.5 (L) 01/14/2021   PLT 216 01/14/2021   CMP     Component Value Date/Time   NA 134 (L) 01/16/2021 0654   K 4.8 01/16/2021 0654   CL 101 01/16/2021 0654   CO2 23 01/16/2021 0654   GLUCOSE 129 (H) 01/16/2021 0654   BUN 19 01/16/2021 0654   CREATININE 1.01 01/16/2021 0654   CREATININE 1.22 01/01/2021 1308   CALCIUM 9.5 01/16/2021 0654   PROT 7.2 01/11/2021 1142   ALBUMIN 3.5 01/11/2021 1142   AST  21 01/11/2021 1142   AST 12 (L) 01/01/2021 1308   ALT 23 01/11/2021 1142   ALT 23 01/01/2021 1308   ALKPHOS 105 01/11/2021 1142   BILITOT 0.4 01/11/2021 1142   BILITOT 0.3 01/01/2021 1308   GFRNONAA >60 01/16/2021 0654   GFRNONAA >60 01/01/2021 1308   GFRAA 70 (L) 11/09/2011 1445         RADIOGRAPHY: DG Chest 2 View  Result Date: 01/13/2021 CLINICAL DATA:  Pulmonary edema EXAM: CHEST - 2 VIEW COMPARISON:  01/11/2021 FINDINGS: Stable cardiomegaly. Mild vascular congestion. Mild interstitial prominence with improved aeration of the lung bases. Possible trace bilateral pleural effusions, decreased from prior. No new focal airspace consolidation. No pneumothorax. IMPRESSION: Cardiomegaly with mild vascular congestion and probable mild interstitial edema, improved from prior. Electronically Signed   By: Davina Poke D.O.  On: 01/13/2021 16:23   DG Chest 2 View  Result Date: 01/11/2021 CLINICAL DATA:  Shortness of breath EXAM: CHEST - 2 VIEW COMPARISON:  December 28, 2020 FINDINGS: The cardiomediastinal silhouette is unchanged and enlarged in contour.Perihilar vascular congestion. Diffuse interstitial prominence. Small bilateral pleural effusions. No pneumothorax. Patchy bibasilar airspace opacities, similar comparison to prior. Visualized abdomen is unremarkable. Mild degenerative changes of the thoracic spine. IMPRESSION: Constellation of findings are favored to reflect pulmonary edema with scattered areas of atelectasis. Atypical infection could present similarly. Electronically Signed   By: Valentino Saxon MD   On: 01/11/2021 12:51   DG Chest 2 View  Result Date: 01/05/2021 CLINICAL DATA:  Dyspnea. EXAM: CHEST - 2 VIEW COMPARISON:  01/04/2021 FINDINGS: 0952 hours. The diffuse bilateral airspace disease seen previously has decreased in the interval with persistent disease most prominent at the left base today. Small bilateral effusions. The cardio pericardial silhouette is enlarged. The  visualized bony structures of the thorax show no acute abnormality. Telemetry leads overlie the chest. IMPRESSION: Interval improvement in bilateral airspace disease with persistent small effusions. Electronically Signed   By: Misty Stanley M.D.   On: 01/05/2021 10:23   CT Angio Chest PE W and/or Wo Contrast  Result Date: 01/13/2021 CLINICAL DATA:  Shortness of breath and tachycardia EXAM: CT ANGIOGRAPHY CHEST WITH CONTRAST TECHNIQUE: Multidetector CT imaging of the chest was performed using the standard protocol during bolus administration of intravenous contrast. Multiplanar CT image reconstructions and MIPs were obtained to evaluate the vascular anatomy. CONTRAST:  28m OMNIPAQUE IOHEXOL 350 MG/ML SOLN COMPARISON:  Chest x-ray from earlier in the same day, CT from 01/04/2021. FINDINGS: Cardiovascular: Thoracic aorta is incompletely opacified. No aneurysmal dilatation is noted. No cardiac enlargement is seen. Pulmonary artery shows a normal branching pattern no filling defects are identified to suggest pulmonary embolism. Mediastinum/Nodes: Thoracic inlet is within normal limits. Scattered stable adenopathy is noted similar to that seen on prior CT examination from 10 days previous. No significant increase is identified. The esophagus is within normal limits. Lungs/Pleura: Lungs are well aerated bilaterally. Small left-sided pleural effusion is again identified. Some patchy ground-glass changes are again identified most consistent with pulmonary edema. The rounded area of airspace disease in the right upper lobe laterally has resolved consistent with postinflammatory change. Upper Abdomen: Cholecystectomy is noted. No other focal abnormality in the upper abdomen is seen. Musculoskeletal: No acute bony abnormality is noted. Changes of prior laminectomy at T5-6 are again seen and stable. Review of the MIP images confirms the above findings. IMPRESSION: No evidence of pulmonary emboli. Stable changes of pulmonary  edema similar to that noted on the prior exam. Small left pleural effusion is seen. Resolution of previously seen right upper lobe airspace opacity consistent with a postinflammatory nature on the prior exam. Stable hilar and mediastinal adenopathy unchanged from the prior study. These changes are stable dating back to September of 2021 and likely of a reactive nature. Continued follow-up in 3-6 months to assess for stability is recommended. Electronically Signed   By: MInez CatalinaM.D.   On: 01/13/2021 19:39   CT Angio Chest PE W and/or Wo Contrast  Result Date: 01/04/2021 CLINICAL DATA:  55year old male with history of shortness of breath when lying down. EXAM: CT ANGIOGRAPHY CHEST WITH CONTRAST TECHNIQUE: Multidetector CT imaging of the chest was performed using the standard protocol during bolus administration of intravenous contrast. Multiplanar CT image reconstructions and MIPs were obtained to evaluate the vascular anatomy. CONTRAST:  1069mOMNIPAQUE  IOHEXOL 300 MG/ML  SOLN COMPARISON:  Chest CTA 11/29/2020. FINDINGS: Cardiovascular: Study is limited by considerable patient respiratory motion. With these limitations in mind, there is no central, lobar or segmental sized filling defects to suggest clinically significant pulmonary embolism. Smaller subsegmental sized pulmonary emboli cannot be entirely excluded on the basis of today's examination. Heart size is enlarged with left ventricular dilatation. There is no significant pericardial fluid, thickening or pericardial calcification. Aortic atherosclerosis. No definite coronary artery calcifications. Mediastinum/Nodes: Enlarged anterior mediastinal lymph nodes measuring up to 2.4 cm in short axis (axial image 48 of series 5). Multiple other prominent borderline enlarged mediastinal and hilar lymph nodes are also noted. Esophagus is unremarkable in appearance. No axillary lymphadenopathy. Lungs/Pleura: Widespread areas of ground-glass attenuation and  interlobular septal thickening noted throughout the lungs bilaterally, the appearance of which is concerning for a background of pulmonary edema. In addition, in the periphery of the right upper lobe (axial image 43 of series 7) there is a more dense nodular area of apparent airspace consolidation measuring 2.3 x 1.6 cm. Small to moderate bilateral pleural effusions lying dependently. Upper Abdomen: Unremarkable. Musculoskeletal: Status post laminectomy at T5-T6. There are no aggressive appearing lytic or blastic lesions noted in the visualized portions of the skeleton. Review of the MIP images confirms the above findings. IMPRESSION: 1. Despite the limitations of today's examination, there is no evidence to suggest clinically significant central, lobar or segmental sized pulmonary embolism. 2. Cardiomegaly with left ventricular dilatation, evidence of pulmonary edema and small to moderate bilateral pleural effusions; imaging findings concerning for congestive heart failure. 3. More nodular focus of probable airspace consolidation in the periphery of the right upper lobe measuring 2.3 x 1.6 cm. This is favored to be of infectious or inflammatory etiology. However, attention on short-term follow-up noncontrast chest CT is recommended in 3 months to ensure complete resolution of this finding, as the possibility of neoplasm is not entirely excluded. At the time of the follow-up imaging, attention should also be directed to the mediastinal lymphadenopathy noted on today's examination. This lymphadenopathy is nonspecific, and can be seen in the setting of congestive heart failure, however, the possibility of lymphoproliferative disorder or other neoplasm warrants of exclusion. 4. Aortic atherosclerosis. Aortic Atherosclerosis (ICD10-I70.0). Electronically Signed   By: Vinnie Langton M.D.   On: 01/04/2021 09:52   US Abdomen Limited  Result Date: 01/04/2021 CLINICAL DATA:  Abdominal distension. EXAM: ULTRASOUND  ABDOMEN LIMITED RIGHT UPPER QUADRANT COMPARISON:  None. FINDINGS: Gallbladder: Surgically absent. Common bile duct: Diameter: 3 mm, within normal limits. Liver: No focal lesion identified. Within normal limits in parenchymal echogenicity. Portal vein is patent on color Doppler imaging with normal direction of blood flow towards the liver. Other: Bilateral pleural effusions noted. IMPRESSION: Prior cholecystectomy.  No evidence of biliary ductal dilatation. Bilateral pleural effusions noted. Electronically Signed   By: Marlaine Hind M.D.   On: 01/04/2021 19:16   DG Chest Portable 1 View  Result Date: 01/04/2021 CLINICAL DATA:  55 year old male with shortness of breath. Abdominal pain. Status post left video-assisted thoracoscopy for biopsy of mediastinal lymph nodes earlier this month. EXAM: PORTABLE CHEST 1 VIEW COMPARISON:  Chest radiographs 12/21/2020 and earlier. FINDINGS: Portable AP upright view at 0431 hours. Right IJ central line earlier this month has been removed. Stable mediastinal contours. Mild cardiomegaly. Streaky asymmetric pulmonary opacity has increased in both lungs but is greater on the right. No pneumothorax. No pleural effusion is evident. No air bronchograms identified. No acute osseous abnormality identified.  Paucity of bowel gas in the upper abdomen. IMPRESSION: 1. New streaky and asymmetric widespread pulmonary opacity, greater in the right lung. This is nonspecific with differential considerations including bilateral pneumonia, aspiration, asymmetric pulmonary edema. 2. Stable cardiomegaly. Electronically Signed   By: Genevie Ann M.D.   On: 01/04/2021 04:50   ECHOCARDIOGRAM COMPLETE  Result Date: 01/04/2021    ECHOCARDIOGRAM REPORT   Patient Name:   Charles [name] Date of Exam: 01/04/2021 Medical Rec #:  [id number]   Height:       69.0 in Accession #:    [id number]  Weight:       256.4 lb Date of Birth:  22-Jul-1966    BSA:          2.296 m Patient Age:    30 years    BP:           104/51 mmHg  Patient Gender: M           HR:           117 bpm. Exam Location:  Inpatient Procedure: 2D Echo, Cardiac Doppler, Color Doppler and Intracardiac            Opacification Agent STAT ECHO Indications:    Dyspnea R06.00  History:        Patient has prior history of Echocardiogram examinations, most                 recent 10/11/2020. Cardiomyopathy and CHF; Risk                 Factors:Hypertension, Diabetes and Current Smoker.  Sonographer:    Vickie Epley RDCS Referring Phys: [id number] Oda Kilts  Sonographer Comments: Technically difficult study due to poor echo windows. IMPRESSIONS  1. Technically difficult; definity used; right heart not well visualized.  2. Left ventricular ejection fraction, by estimation, is 20 to 25%. The left ventricle has severely decreased function. The left ventricle demonstrates global hypokinesis. The left ventricular internal cavity size was severely dilated. Left ventricular diastolic parameters are consistent with Grade III diastolic dysfunction (restrictive).  3. Right ventricular systolic function is normal. The right ventricular size is normal.  4. The mitral valve is normal in structure. Mild mitral valve regurgitation. No evidence of mitral stenosis.  5. The aortic valve is tricuspid. Aortic valve regurgitation is trivial. No aortic stenosis is present.  6. The inferior vena cava is normal in size with greater than 50% respiratory variability, suggesting right atrial pressure of 3 mmHg. FINDINGS  Left Ventricle: Left ventricular ejection fraction, by estimation, is 20 to 25%. The left ventricle has severely decreased function. The left ventricle demonstrates global hypokinesis. Definity contrast agent was given IV to delineate the left ventricular endocardial borders. The left ventricular internal cavity size was severely dilated. There is no left ventricular hypertrophy. Left ventricular diastolic parameters are consistent with Grade III diastolic dysfunction (restrictive).  Right Ventricle: The right ventricular size is normal. Right ventricular systolic function is normal. Left Atrium: Left atrial size was normal in size. Right Atrium: Right atrial size was normal in size. Pericardium: There is no evidence of pericardial effusion. Mitral Valve: The mitral valve is normal in structure. Mild mitral valve regurgitation. No evidence of mitral valve stenosis. Tricuspid Valve: The tricuspid valve is normal in structure. Tricuspid valve regurgitation is trivial. No evidence of tricuspid stenosis. Aortic Valve: The aortic valve is tricuspid. Aortic valve regurgitation is trivial. No aortic stenosis is present. Pulmonic Valve: The pulmonic valve was not well  visualized. Pulmonic valve regurgitation is not visualized. No evidence of pulmonic stenosis. Aorta: The aortic root is normal in size and structure. Venous: The inferior vena cava is normal in size with greater than 50% respiratory variability, suggesting right atrial pressure of 3 mmHg. IAS/Shunts: The interatrial septum was not well visualized. Additional Comments: Technically difficult; definity used; right heart not well visualized.  LEFT VENTRICLE PLAX 2D LVIDd:         7.40 cm LVIDs:         6.70 cm LV PW:         0.70 cm LV IVS:        0.70 cm LVOT diam:     2.20 cm LV SV:         21 LV SV Index:   9 LVOT Area:     3.80 cm  RIGHT VENTRICLE TAPSE (M-mode): 1.2 cm LEFT ATRIUM           Index       RIGHT ATRIUM           Index LA diam:      2.80 cm 1.22 cm/m  RA Area:     15.40 cm LA Vol (A2C): 42.4 ml 18.47 ml/m RA Volume:   36.30 ml  15.81 ml/m LA Vol (A4C): 49.3 ml 21.47 ml/m  AORTIC VALVE LVOT Vmax:   46.00 cm/s LVOT Vmean:  34.300 cm/s LVOT VTI:    0.056 m  AORTA Ao Root diam: 3.50 cm MR Peak grad: 49.8 mmHg MR Mean grad: 31.0 mmHg   SHUNTS MR Vmax:      353.00 cm/s Systemic VTI:  0.06 m MR Vmean:     263.0 cm/s  Systemic Diam: 2.20 cm Kirk Ruths MD Electronically signed by Kirk Ruths MD Signature Date/Time:  01/04/2021/11:55:33 AM    Final       IMPRESSION/PLAN: Hodgkin's Lymphoma, nodular lymphocyte predominant, stage IA  Today, I talked to the patient about the findings and work-up thus far. We discussed the patient's diagnosis of nodular lymphocyte predominant Hodgkin's lymphoma of the mediastinum and general treatment for this, highlighting the role of radiotherapy in the management. We discussed the available radiation techniques, and focused on the details of logistics and delivery.    We discussed the risks, benefits, and side effects of radiotherapy. Side effects may include but not necessarily be limited to: Esophagitis, fatigue, skin irritation, rare internal injury to organs of the chest including lung, heart, thyroid, spinal cord. No guarantees of treatment were given. A consent form was signed and placed in the patient's medical record. The patient was encouraged to ask questions that I answered to the best of my ability.   Anticipate 4 weeks of radiation therapy.  I have ordered a restaging PET scan since it has been about 4 months since his previous PET scan.  We will simulate him for treatment planning thereafter.  I asked the patient today about tobacco use. The patient uses tobacco.  I advised the patient to quit. Services were offered by me today including outpatient counseling and pharmacotherapy. I assessed for the willingness to attempt to quit and provided encouragement and demonstrated willingness to make referrals and/or prescriptions to help the patient attempt to quit. The patient has follow-up with the oncologic team to touch base on their tobacco use and /or cessation efforts.  Over 3 minutes were spent on this issue.  He smokes about 5 cigarettes a week and states that he will quit today on his own  without pharmacotherapy or counseling.  Referral to social work as patient has transportation needs.  On date of service, in total, I spent 50 minutes on this encounter.  Patient was seen in person.   __________________________________________   Eppie Gibson, MD  This document serves as a record of services personally performed by Eppie Gibson, MD. It was created on his behalf by Clerance Lav, a trained medical scribe. The creation of this record is based on the scribe's personal observations and the provider's statements to them. This document has been checked and approved by the attending provider.

## 2021-01-22 ENCOUNTER — Encounter: Payer: Self-pay | Admitting: Internal Medicine

## 2021-01-22 ENCOUNTER — Encounter: Payer: Self-pay | Admitting: General Practice

## 2021-01-22 ENCOUNTER — Other Ambulatory Visit (HOSPITAL_COMMUNITY): Payer: Self-pay | Admitting: Internal Medicine

## 2021-01-22 ENCOUNTER — Encounter: Payer: Self-pay | Admitting: Student

## 2021-01-22 ENCOUNTER — Ambulatory Visit (INDEPENDENT_AMBULATORY_CARE_PROVIDER_SITE_OTHER): Payer: Medicaid Other | Admitting: Internal Medicine

## 2021-01-22 VITALS — BP 98/82 | HR 114 | Temp 98.3°F | Ht 69.0 in | Wt 247.9 lb

## 2021-01-22 DIAGNOSIS — E119 Type 2 diabetes mellitus without complications: Secondary | ICD-10-CM | POA: Diagnosis not present

## 2021-01-22 DIAGNOSIS — I5043 Acute on chronic combined systolic (congestive) and diastolic (congestive) heart failure: Secondary | ICD-10-CM | POA: Diagnosis not present

## 2021-01-22 DIAGNOSIS — I5020 Unspecified systolic (congestive) heart failure: Secondary | ICD-10-CM | POA: Diagnosis present

## 2021-01-22 DIAGNOSIS — R000 Tachycardia, unspecified: Secondary | ICD-10-CM

## 2021-01-22 MED ORDER — METOPROLOL SUCCINATE ER 100 MG PO TB24: 100.0000 mg | ORAL_TABLET | Freq: Every day | ORAL | 11 refills | Status: DC

## 2021-01-22 NOTE — Assessment & Plan Note (Addendum)
Patient presents to the clinic with continued tachycardia. He was noted to be in sinus tach on his admission which was well controlled after increasing his 50 mg metoprolol to 75 mg daily. Patient endorses feeling like his heart rate is increased. Noted to have HR of 114 during his visit today. Will increase his metoprolol to 100 mg today. - Increased Metoprolol to 100 mg  - FU with cardiology appointment

## 2021-01-22 NOTE — Progress Notes (Signed)
   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 M/F, with a PMH noted below, who presents to the clinic for a hospital follow up. To see the management of their acute and chronic conditions, please see the A&P note under the Encounters tab.   Past Medical History:  Diagnosis Date  . Arthritis    lower back  . Bipolar 1 disorder ([place])   . Cardiomyopathy, nonischemic ([place])    no CAD 10/14/20 cath, EF 20-25% by 10/11/20 echo  . CHF (congestive heart failure) ([place])   . COVID 10/2020  . Depression   . DM2 (diabetes mellitus, type 2) ([place])   . HTN (hypertension)   . Myocardial infarction [facility])    Review of Systems:   Review of Systems  Constitutional: Negative for chills, diaphoresis, malaise/fatigue and weight loss.  Respiratory: Negative for cough and sputum production.   Cardiovascular: Positive for palpitations. Negative for chest pain, orthopnea, claudication and leg swelling.  Gastrointestinal: Negative for abdominal pain, constipation, melena, nausea and vomiting.       Loose stools     Physical Exam:  Vitals:   01/22/21 1101  BP: 98/82  Pulse: (!) 114  Temp: 98.3 F (36.8 C)  TempSrc: Oral  SpO2: 97%  Weight: 247 lb 14.4 oz (112.4 kg)  Height: 5\' 9"  (1.753 m)   Physical Exam Constitutional:      General: He is not in acute distress.    Appearance: Normal appearance. He is not ill-appearing or toxic-appearing.  HENT:     Head: Normocephalic and atraumatic.  Cardiovascular:     Rate and Rhythm: Regular rhythm. Tachycardia present.     Pulses: Normal pulses.     Heart sounds: Normal heart sounds. No murmur heard. No friction rub. No gallop.   Pulmonary:     Effort: Pulmonary effort is normal. No respiratory distress.     Breath sounds: Normal breath sounds. No wheezing, rhonchi or rales.  Chest:     Chest wall: No tenderness.  Abdominal:     General: Abdomen is flat. Bowel sounds are normal. There is no  distension.     Palpations: Abdomen is soft. There is no mass.     Tenderness: There is no abdominal tenderness. There is no guarding.     Hernia: No hernia is present.  Neurological:     Mental Status: He is alert.      Assessment & Plan:   See Encounters Tab for problem based charting.  Patient discussed with Dr. Rebeca Alert

## 2021-01-22 NOTE — Patient Instructions (Addendum)
Mr. Charles [name],   It was a pleasure meeting you today. Today we talked about your heart failure, fast heart beat, and loose stools. For your heart failure, please continue your current medications. Your weight today was 247 pounds. Please weigh yourself daily, and if you notice a 3 pound weight change in 2 days, please call the clinic. For your heart beat, we will increase your metoprolol from 75mg  to 100 mg. Please continue to follow up with your cardiology appointments.

## 2021-01-22 NOTE — Assessment & Plan Note (Addendum)
Patient presents for a hospital follow up for acute on chronic heart failure exacerbation. Last echo showing reduced EF of 20-25%. He is currently on Digoxin 125 mg daily, Lasix 40 mg daily, metoprolol succinate 75 mg daily, entresto 24-26 mg twice daily, aldactone 12.5 mg daily.   Patient with a recent hospital admission this past month presented to the ED with 2 days of dyspnea and increased leg swelling. This occurred in the setting of missing lasix doses and his O2 device breaking. His admitting weight was 111.6 kg, and he was found to be tachycardic, with elevated BNP and flat troponins. Cardiology consulted during his hospital stay and he was diuresed with 80 mg IV lasix and continued on his home medications. His metoprolol dose was increased 2/2 to tachycardia, which was well controlled on metop 75mg  daily. He was discharged to [facility] ([facility]F).    A/P Since discharge the patient has been doing well. He states that he has been able to sleep at night and not feel short of breath. He has been compliant with his medications, and does not need refills at this time. He does weigh himself every day. He still feels like his heart rate is increased despite his increased BB dose. He was able to ambulate to the clinic, unassisted and without issue.   He is tachycardic on examination. Will increase metoprolol to 100 mg daily. Otherwise he will follow up with his cardiologist next month on his current regimen.  - Continue Digoxin 125 mg daily, Lasix 40 mg daily, entresto 24-26 mg twice daily, aldactone 12.5 mg daily.  - Increased metoprolol to 100 mg daily - Continue to weigh self daily, if >3 lb change in 2 days, call clinic or cardiology office. - cbc, bmp today

## 2021-01-22 NOTE — Progress Notes (Signed)
[place] CSW Progress Notes  Referral received from Purvis Kilts, RN - patient states he will be moving to a new facility in [facility] and may need help w transportation.  Per record, patient was recently inpatient and was discharged on 3/11 to [facility] [phone]), an assisted living facility in [place].  There is no record of a change in placement.  Assisted living facilities provide transportation to/from medical appointments as part of their care to patients, thus patient should have no transportation issues.    Spoke w staff member Jefm Miles -- transportation is provided by facility and must be scheduled by calling Carloyn Manner [phone] - his personal cell #).  Facility requests that [facility] fax appointments to facility AND call Carloyn Manner who does transport for facility as patient has not been consistent w communicating his transportation needs.  Appointment schedule can also be sent to [phone] in advance.  Edwyna Shell, LCSW Clinical Social Worker Phone:  [phone]

## 2021-01-22 NOTE — Assessment & Plan Note (Signed)
Currently on Farxiga 10 mg, previously on Levemir 25 units daily. He states that his sugars are doing well, and he does not need refills at this time. Last A1c 7.4% on 01/05/21. - FU A1c in 2 months - Continue Farxiga 10 mg daily

## 2021-01-23 ENCOUNTER — Telehealth: Payer: Self-pay | Admitting: *Deleted

## 2021-01-23 LAB — CBC
Hematocrit: 47.6 % (ref 37.5–51.0)
Hemoglobin: 15.3 g/dL (ref 13.0–17.7)
MCH: 25.5 pg — ABNORMAL LOW (ref 26.6–33.0)
MCHC: 32.1 g/dL (ref 31.5–35.7)
MCV: 80 fL (ref 79–97)
Platelets: 185 10*3/uL (ref 150–450)
RBC: 5.99 x10E6/uL — ABNORMAL HIGH (ref 4.14–5.80)
RDW: 17.6 % — ABNORMAL HIGH (ref 11.6–15.4)
WBC: 9.9 10*3/uL (ref 3.4–10.8)

## 2021-01-23 LAB — BMP8+ANION GAP
Anion Gap: 18 mmol/L (ref 10.0–18.0)
BUN/Creatinine Ratio: 11 (ref 9–20)
BUN: 13 mg/dL (ref 6–24)
CO2: 21 mmol/L (ref 20–29)
Calcium: 9.8 mg/dL (ref 8.7–10.2)
Chloride: 101 mmol/L (ref 96–106)
Creatinine, Ser: 1.22 mg/dL (ref 0.76–1.27)
Glucose: 110 mg/dL — ABNORMAL HIGH (ref 65–99)
Potassium: 4.4 mmol/L (ref 3.5–5.2)
Sodium: 140 mmol/L (ref 134–144)
eGFR: 70 mL/min/{1.73_m2} (ref >59)

## 2021-01-23 NOTE — Progress Notes (Signed)
Internal Medicine Clinic Attending  Case discussed with Dr. Winters at the time of the visit.  We reviewed the resident's history and exam and pertinent patient test results.  I agree with the assessment, diagnosis, and plan of care documented in the resident's note.  Alexander Raines, M.D., Ph.D.  

## 2021-01-23 NOTE — Telephone Encounter (Signed)
CALLED PATIENT TO INFORM OF PET SCAN FOR 02-06-21 - ARRIVAL TIME- 7:30 AM @ [facility], PATIENT TO BE NPO- 6 HRS. PRIOR TO TEST, AND PATIENT TO NOT HAVE CARBS OR SUGARS - LAST MEAL OF DAY BEFORE TEST, SPOKE WITH MANAGER MED TECH WHERE PATIENT LIVES ([place]) AND HE IS AWARE OF THIS TEST

## 2021-02-02 LAB — ACID FAST CULTURE WITH REFLEXED SENSITIVITIES (MYCOBACTERIA): Acid Fast Culture: NEGATIVE

## 2021-02-02 IMAGING — CT CT ANGIO CHEST
2 of 7 series · 17 of 46 positions shown · IV contrast (APPLIED)
Comparison: 11/29/2020, 10/10/2020

CLINICAL DATA: Chest pain, cough, OR1DR-7D positive 2 weeks ago

EXAM:
CT ANGIOGRAPHY CHEST WITH CONTRAST
TECHNIQUE: Multidetector CT imaging of the chest was performed using the
standard protocol during bolus administration of intravenous
contrast. Multiplanar CT image reconstructions and MIPs were
obtained to evaluate the vascular anatomy.
CONTRAST:  75mL OMNIPAQUE IOHEXOL 350 MG/ML SOLN

[Series 7: thins · axial · 0.80mm/px · z∈[+1282,+1524]mm · 14 of 389 slices shown]
[im 22/389  lung]
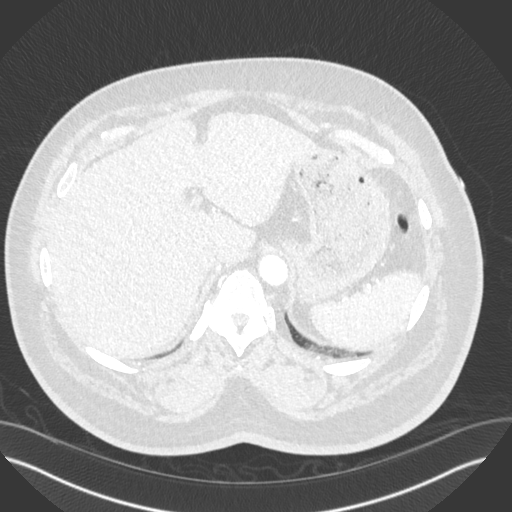
[im 44/389  soft-tissue]
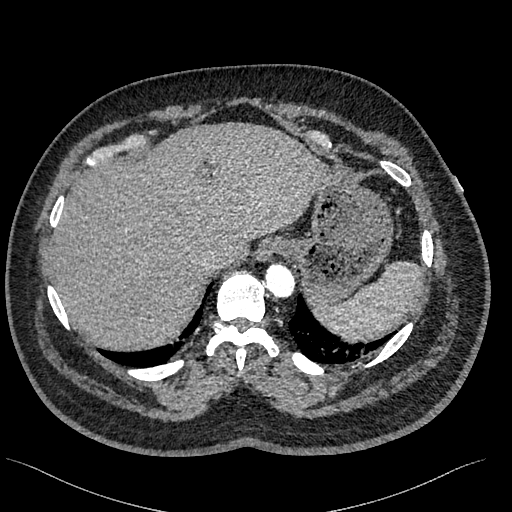
[im 87/389  lung]
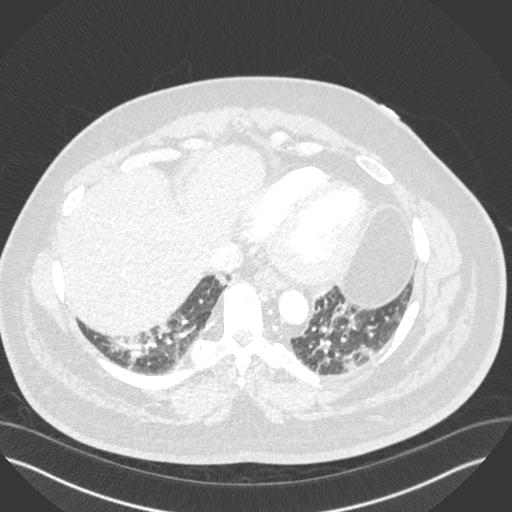
[im 108/389  soft-tissue]
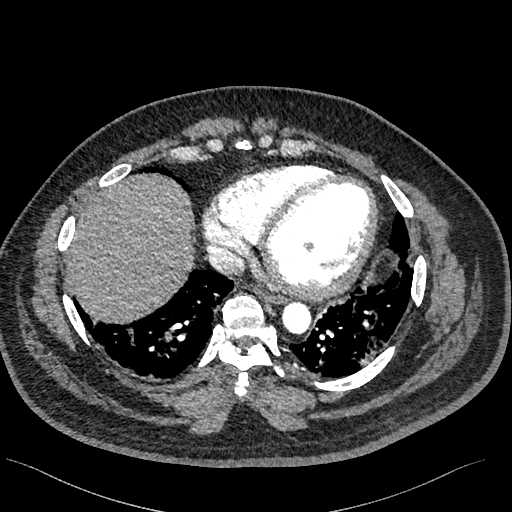
[im 130/389  lung]
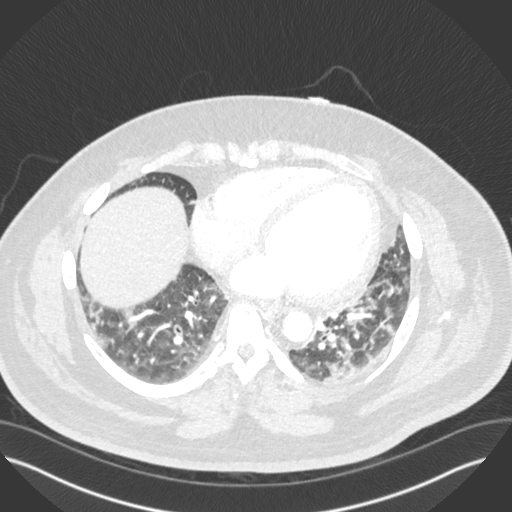
[im 151/389  soft-tissue]
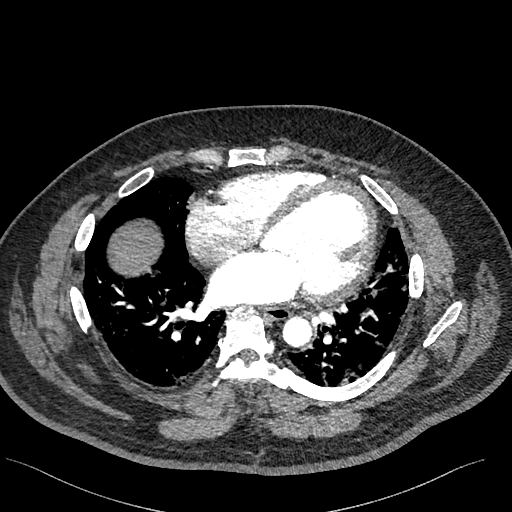
[im 173/389  lung]
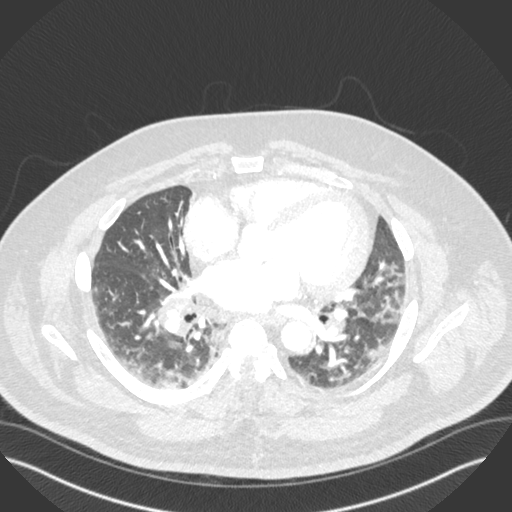
[im 216/389  soft-tissue]
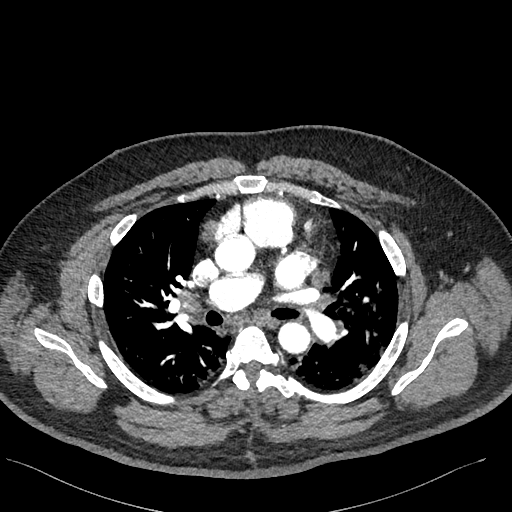
[im 238/389  lung]
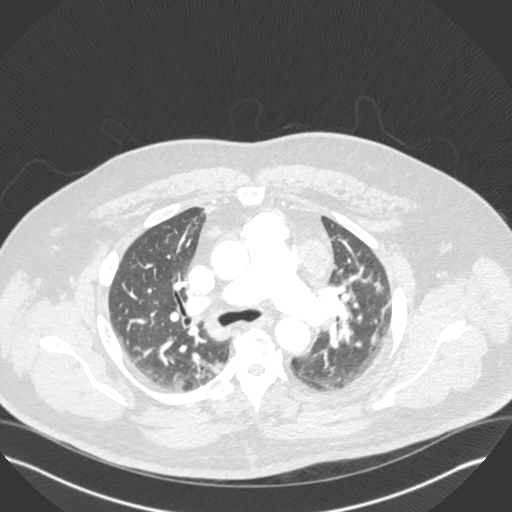
[im 259/389  soft-tissue]
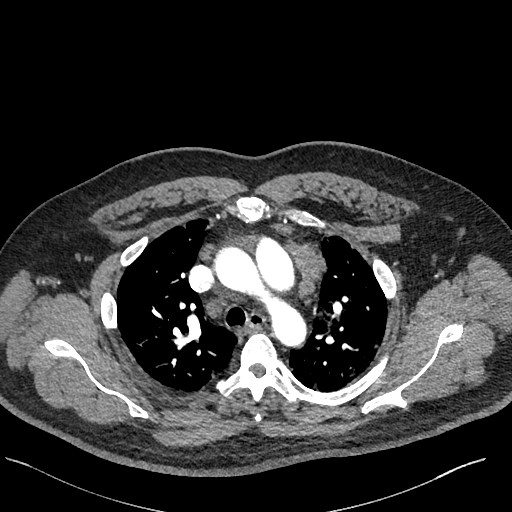
[im 281/389  lung]
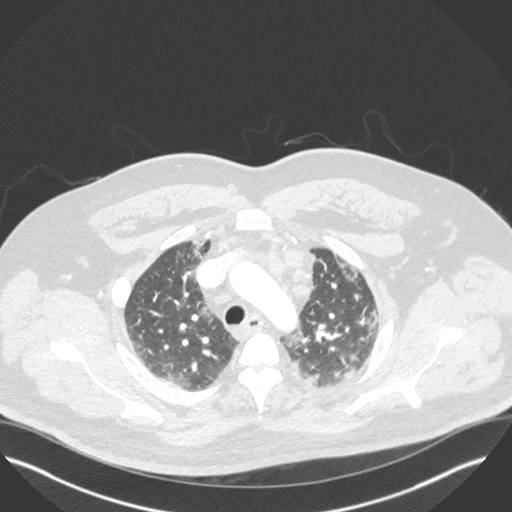
[im 302/389  soft-tissue]
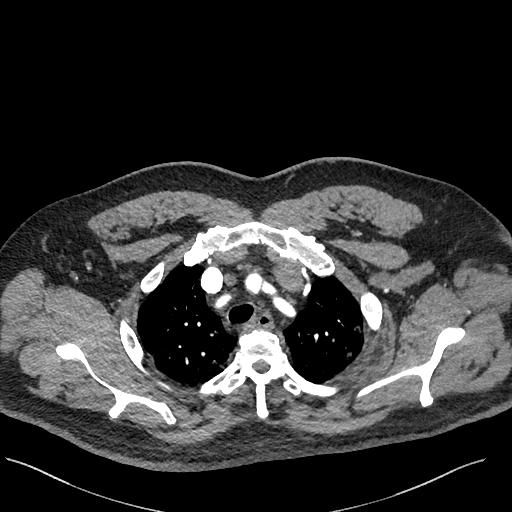
[im 345/389  lung]
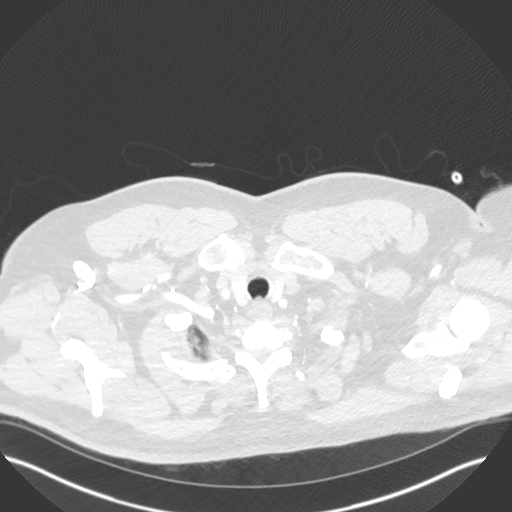
[im 367/389  soft-tissue]
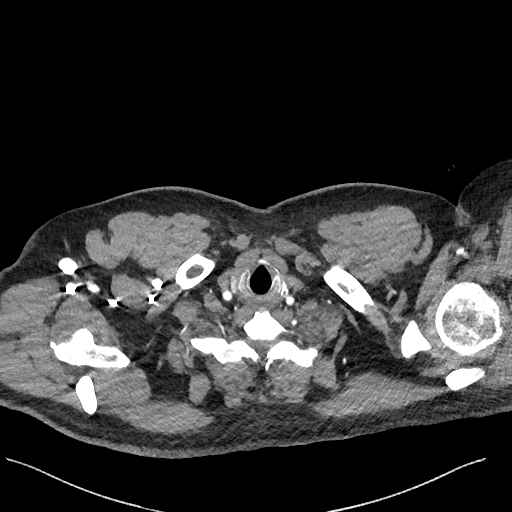

[Series 8: cor · coronal · 0.57mm/px · 3 of 171 slices shown]
[im 43/171  soft-tissue]
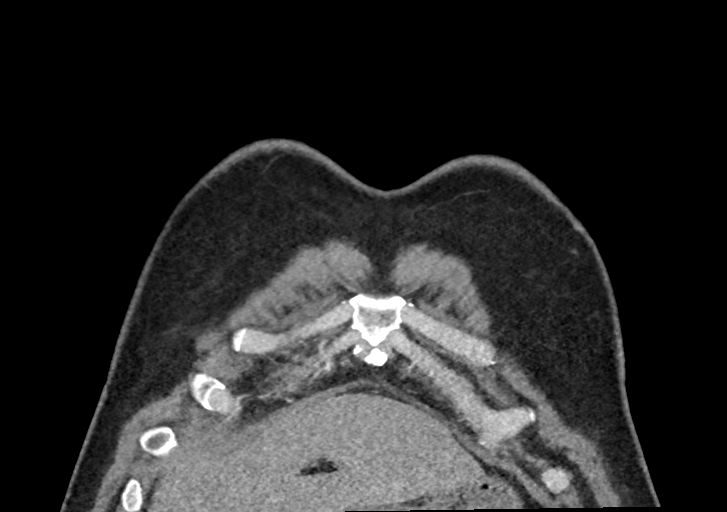
[im 86/171  soft-tissue]
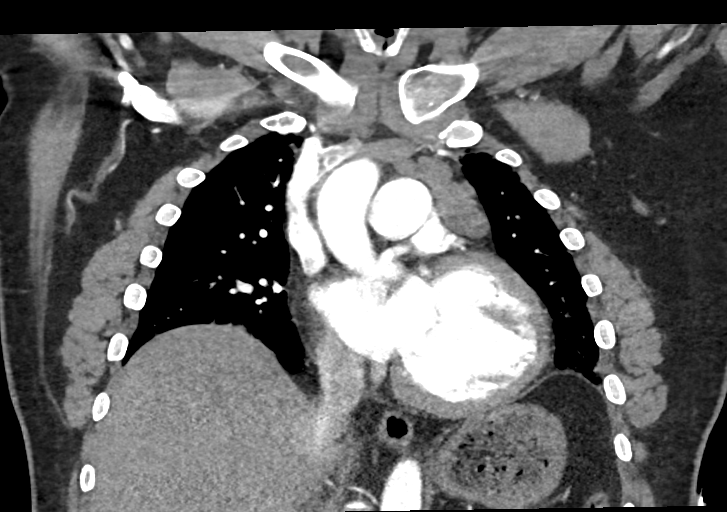
[im 128/171  soft-tissue]
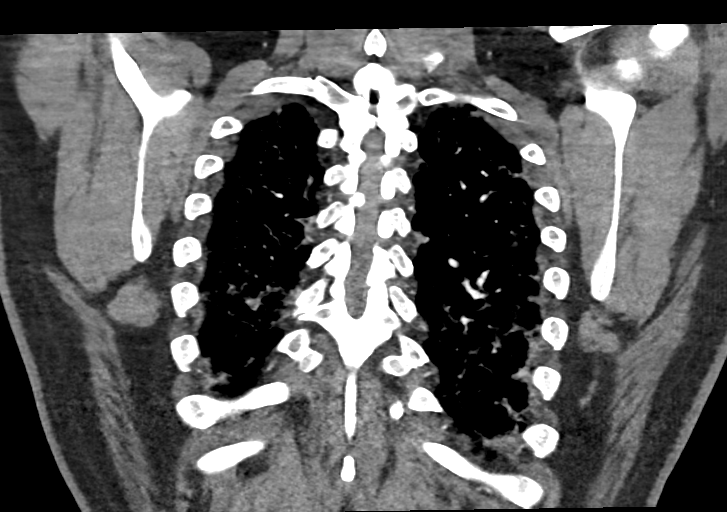

[17 of 46 positions shown; findings below may reference images not displayed]

FINDINGS: Cardiovascular: This is a technically adequate evaluation of the
pulmonary vasculature. No filling defects or pulmonary emboli.

Normal caliber of the thoracic aorta without evidence of dissection.

Heart is enlarged, with persistent left ventricular dilatation. No
pericardial effusion.

Mediastinum/Nodes: There is persistent mediastinal lymphadenopathy,
with index lymph node in the AP window image 49/5 measuring 2.2 cm
slightly decreased since prior study where this measured 2.5 cm.
Borderline enlarged right paratracheal and bilateral hilar lymph
nodes are noted, slightly more pronounced than previous. Index right
paratracheal lymph node measuring 1.4 cm image 47/5 previously
measured approximately 1.0 cm.

Trachea, esophagus, and thyroid are unremarkable.

Lungs/Pleura: There is been interval development of lower lobe
predominant interstitial and ground-glass opacities, compatible with
given history of recent OR1DR-7D diagnosis. No effusion or
pneumothorax. The central airways are patent.

Upper Abdomen: No acute abnormality.

Musculoskeletal: No acute or destructive bony lesions. Stable
postsurgical changes at T6 and T7. Reconstructed images demonstrate
no additional findings.

Review of the MIP images confirms the above findings.
IMPRESSION: 1. No evidence of pulmonary embolus.
2. Basilar predominant interstitial and ground-glass opacities most
consistent with OR1DR-7D pneumonia given clinical history.
3. Mediastinal and hilar lymphadenopathy. This may be a combination
of known underlying lymphoma with superimposed reactive change given
pneumonia.
4. Stable left ventricular dilatation.

## 2021-02-03 ENCOUNTER — Ambulatory Visit (INDEPENDENT_AMBULATORY_CARE_PROVIDER_SITE_OTHER): Payer: Medicaid Other | Admitting: Licensed Clinical Social Worker

## 2021-02-03 ENCOUNTER — Other Ambulatory Visit: Payer: Self-pay

## 2021-02-03 DIAGNOSIS — F3162 Bipolar disorder, current episode mixed, moderate: Secondary | ICD-10-CM

## 2021-02-03 NOTE — Progress Notes (Signed)
Comprehensive Clinical Assessment (CCA) Note  02/03/2021 Charles [name] [id number]  Chief Complaint:  Chief Complaint  Patient presents with  . medication     Getting back on medication    Visit Diagnosis: bipolar 1 disorder     Client is a 55 year old male. Client is referred by [facility] for a depression and anxiety.   Client states mental health symptoms as evidenced by:   Depression Sleep (too much or little); Change in energy/activity; Increase/decrease in appetite Sleep (too much or little); Change in energy/activity; Increase/decrease in appetiteDepression. Sleep (too much or little); Change in energy/activity; Increase/decrease in appetite. Last Filed Value  Duration of Depressive Symptoms -- Greater than two weeksDuration of Depressive Symptoms. Greater than two weeks. Data is from another encounter. Last Filed Value  Mania Increased Energy; Euphoria; Overconfidence; Racing thoughts     Client denies suicidal and homicidal ideations at this time.   Client denies hallucinations and delusions at this time.   Client was screened for the following SDOH: Smoking and exercise   Assessment Information that integrates subjective and objective details with a therapist's professional interpretation:    Pt was alert and oriented x 5. He was dressed casually and engaged well in therapy session. Pt presented with depressed mood/affect.   Pt reports that he went to [facility] 8 weeks ago for increase in suicidal ideations. He denies suicidal or homicidal ideations at time of the assessment. He reports that his moods go up and down. Currently he states he is very happy and enjoying his life. Charles [name] states he has Hx of bipolar disorder and schizophrenia. At one time he was [place]. Pt has not taken injection in many years. He currently lives in an [facility] facility but is not happy with the care he is getting there.  Pt states his illness is his primary stressor as he was Dx with  cancer and will be start chemo Tx in the next few weeks. Charles [name] is not sure what medications he was on prior to stopping them. Primary goal is to complete CCA and get back on his medications     Client meets criteria for bipolar 1 depression   Client states use of the following substances: Hx of cocaine abuse   Therapist addressed (substance use) concern, although client meets criteria, he/ she reports they do not wish to pursue tx at this time although therapist feels they would benefit from [place] counseling. (IF CLIENT HAS A S/A PROBLEM)   Treatment recommendations are include plan: Referral to medication mgmt       Client was in agreement with treatment recommendations.    CCA Screening, Triage and Referral (STR)  Patient Reported Information How did you hear about [place]? Self  Referral name: emergency room  Whom do you see for routine medical problems? Primary Care  Practice/Facility Name: cannot recall provider   What Is the Reason for Your Visit/Call Today? Patient states that he is depressed and he is suicidal  How Long Has This Been Causing You Problems? 1 wk - 1 month  What Do You Feel Would Help You the Most Today? Other (Comment) (Patient requesting  inpatient treatment)   Have You Recently Been in Any Inpatient Treatment (Hospital/Detox/Crisis Center/28-Day Program)? No  Have You Ever Received Services From [employer] Before? Yes  Who Do You See at [facility]? Patient has been seen in the ED and at the [facility]   Have You Recently Had Any Thoughts  About Hurting Yourself? No (Last time was in Feb.)  Are You Planning to [place] At This time? No   Have you Recently Had Thoughts About [place]? No  Have You Used Any Alcohol or Drugs in the Past 24 Hours? No  Do You Currently Have a Therapist/Psychiatrist? No  Have You Been Recently Discharged From Any Office Practice or Programs? No    CCA Screening Triage Referral  Assessment Type of Contact: Tele-Assessment  Is this Initial or Reassessment? Initial Assessment  Date Telepsych consult ordered in [facility]:  02/03/2021  Time Telepsych consult ordered in [facility]:  0706   Patient Reported Information Reviewed? Yes  Collateral Involvement: No collateral information available at time of assessment  Is CPS involved or ever been involved? Never  Is APS involved or ever been involved? Never   Patient Determined To Be At Risk for Harm To Self or Others Based on Review of Patient Reported Information or Presenting Complaint? No  Contacted To Inform of Risk of Harm To Self or Others: Other: Comment (assisted living center where patient lives is aware of situation)   Location of Assessment: [facility] Assessment Services   Does Patient Present under Involuntary Commitment? No  [place] of Residence: [place]   Patient Currently Receiving the Following Services: Not Receiving Services  Options For Referral: Inpatient Hospitalization; Medication Management; Outpatient Therapy  CCA Biopsychosocial Intake/Chief Complaint:  Pt states that he presented 8 weeks ago to [facility] with Suicdal Ideations, currently pt denies suicdal ideations but wants to get back on his medication. He reports that for the first time in a while he is happy  Current Symptoms/Problems: depression   Patient Reported Schizophrenia/Schizoaffective Diagnosis in Past: Yes   Strengths: "I do not know"  Preferences: [name] requesting to be discharged to a new living facility.  Patient requires a walker for ambulation. Requires a heart diet.  Abilities: Patient states that he really does not have any abilities   Type of Services Patient Feels are Needed: Medication mgmt   Initial Clinical Notes/Concerns: No data recorded  Mental Health Symptoms Depression:  Sleep (too much or little); Change in energy/activity; Increase/decrease in appetite   Duration of Depressive symptoms: Greater  than two weeks   Mania:  Increased Energy; Euphoria; Overconfidence; Racing thoughts   Anxiety:   Fatigue; Restlessness; Sleep; Tension; Worrying   Psychosis:  None   Duration of Psychotic symptoms: No data recorded  Trauma:  None   Obsessions:  None   Compulsions:  None   Inattention:  None   Hyperactivity/Impulsivity:  N/A   Oppositional/Defiant Behaviors:  None   Emotional Irregularity:  None   Other Mood/Personality Symptoms:  No data recorded   Mental Status Exam Appearance and self-care  Stature:  Average   Weight:  Average weight   Clothing:  Age-appropriate   Grooming:  Normal   Cosmetic use:  None   Posture/gait:  Normal; Other (Comment) (patient requires a walker)   Motor activity:  Not Remarkable   Sensorium  Attention:  Normal   Concentration:  Normal   Orientation:  Person; Object; Place; Situation; Time   Recall/memory:  Normal   Affect and Mood  Affect:  Anxious; Appropriate; Depressed   Mood:  Anxious; Depressed; Worthless; Hopeless   Relating  Eye contact:  Normal   Facial expression:  Depressed   Attitude toward examiner:  Cooperative   Thought and Language  Speech flow: Normal   Thought content:  Appropriate to Mood and Circumstances  Preoccupation:  None   Hallucinations:  None   Organization:  No data recorded  [employer] of Knowledge:  Average   Intelligence:  Average   Abstraction:  Functional   Judgement:  Poor   Reality Testing:  Realistic   Insight:  Fair   Decision Making:  Impulsive   Social Functioning  Social Maturity:  Isolates   Social Judgement:  Normal   Stress  Stressors:  Illness; Financial   Coping Ability:  Exhausted; Overwhelmed   Skill Deficits:  Self-control; Responsibility   Supports:  Support needed     Religion: Religion/Spirituality Are You A Religious Person?: Yes (not assessed) What is Your Religious Affiliation?:  Catholic  Leisure/Recreation: Leisure / Recreation Do You Have Hobbies?: Yes Leisure and Hobbies: T.V  Exercise/Diet: Exercise/Diet Do You Exercise?: No Have You Gained or Lost A Significant Amount of Weight in the Past Six Months?: No Do You Follow a Special Diet?: Yes Type of Diet: special diet due to heart condition Do You Have Any Trouble Sleeping?: Yes Explanation of Sleeping Difficulties: states that he sleeps excessively   CCA Employment/Education Employment/Work Situation: Employment / Work Situation Employment situation: On disability Why is patient on disability: mental health and physical disability Patient's job has been impacted by current illness: No What is the longest time patient has a held a job?: unknown Where was the patient employed at that time?: unknown Has patient ever been in the [employer]?: No  Education: Education Last Grade Completed: 9 Name of [employer]: unknown high school in [place] Did Teacher, adult education From [employer]?: No (patient did get his GED) Did Physicist, medical?: No Did [place]?: No Did You Have Any Special Interests In School?: none reported Did You Have An Individualized Education Program (IIEP): No Did You Have Any Difficulty At School?: No   CCA Family/Childhood History Family and Relationship History: Family history Marital status: Divorced Divorced, when?: 2 years What types of issues is patient dealing with in the relationship?: patient is not currently in a relationship Are you sexually active?: No What is your sexual orientation?: heterosexual Has your sexual activity been affected by drugs, alcohol, medication, or emotional stress?: N/A Does patient have children?: Yes How is patient's relationship with their children?: Patient has very little contact with his child  Childhood History:  Childhood History By whom was/is the patient raised?: Grandparents Additional childhood history  information: Patient states that his mother had mental health issues Description of patient's relationship with caregiver when they were a child: Patient states that he was raised by his grandparents in what he describes as a good home How were you disciplined when you got in trouble as a child/adolescent?: not assessed Did patient suffer any verbal/emotional/physical/sexual abuse as a child?: No Has patient ever been sexually abused/assaulted/raped as an adolescent or adult?: No Witnessed domestic violence?: No Has patient been affected by domestic violence as an adult?: No  DSM5 Diagnoses: Patient Active Problem List   Diagnosis Date Noted  . Acute hypoxemic respiratory failure ([place]) 01/13/2021  . Acute on chronic heart failure with reduced ejection fraction and diastolic dysfunction ([place]) 01/12/2021  . Schizophrenia ([place]) 01/04/2021  . Diarrhea 01/04/2021  . Pulmonary edema   . Mediastinal adenopathy 12/18/2020  . Adjustment disorder with mixed anxiety and depressed mood 10/30/2020  . Mediastinal lymphadenopathy 10/11/2020  . Shortness of breath 10/11/2020  . Sinus tachycardia 10/11/2020  . Night sweats 10/11/2020  . DM2 (diabetes mellitus, type 2) () 10/11/2020  .  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 Hypertension 09/25/2020  . BPH (benign prostatic hyperplasia) 09/25/2020  . Insomnia 09/25/2020     Dory Horn, LCSW

## 2021-02-04 ENCOUNTER — Telehealth: Payer: Self-pay

## 2021-02-04 ENCOUNTER — Ambulatory Visit (HOSPITAL_COMMUNITY): Payer: Medicaid Other | Admitting: Physician Assistant

## 2021-02-04 NOTE — Telephone Encounter (Signed)
Spoke with staff member at patient's assisted living facility to confirm transportation would be set up for patient's appointments on 02/06/21. Staff member confirmed that patient would be dropped off at 07:30 at [facility] for PET scan, and then picked up from [facility] later that day after CT simulation planning session. Provided my direct contact number should patient or staff have any other questions/concerns before patient's appointments on Friday

## 2021-02-06 ENCOUNTER — Ambulatory Visit
Admission: RE | Admit: 2021-02-06 | Discharge: 2021-02-06 | Disposition: A | Discharge status: Home / Self Care | Payer: Medicaid Other | Source: Ambulatory Visit | Attending: Radiation Oncology | Admitting: Radiation Oncology

## 2021-02-06 ENCOUNTER — Other Ambulatory Visit: Payer: Self-pay

## 2021-02-06 ENCOUNTER — Ambulatory Visit (HOSPITAL_COMMUNITY)
Admission: RE | Admit: 2021-02-06 | Discharge: 2021-02-06 | Disposition: A | Discharge status: Home / Self Care | Payer: Medicaid Other | Source: Ambulatory Visit | Attending: Radiation Oncology | Admitting: Radiation Oncology

## 2021-02-06 ENCOUNTER — Ambulatory Visit: Payer: Medicaid Other

## 2021-02-06 ENCOUNTER — Ambulatory Visit: Payer: Medicaid Other | Admitting: Radiation Oncology

## 2021-02-06 VITALS — BP 116/75 | HR 105 | Temp 96.4°F | Resp 19 | Wt 247.1 lb

## 2021-02-06 DIAGNOSIS — C8102 Nodular lymphocyte predominant Hodgkin lymphoma, intrathoracic lymph nodes: Secondary | ICD-10-CM | POA: Insufficient documentation

## 2021-02-06 DIAGNOSIS — Z510 Encounter for antineoplastic radiation therapy: Secondary | ICD-10-CM | POA: Diagnosis present

## 2021-02-06 LAB — GLUCOSE, CAPILLARY: Glucose-Capillary: 102 mg/dL — ABNORMAL HIGH (ref 70–99)

## 2021-02-06 MED ORDER — SODIUM CHLORIDE 0.9% FLUSH
10.0000 mL | Freq: Once | INTRAVENOUS | Status: AC
  Administered 2021-02-06: 10 mL via INTRAVENOUS

## 2021-02-06 MED ORDER — FLUDEOXYGLUCOSE F - 18 (FDG) INJECTION
12.2000 | Freq: Once | INTRAVENOUS | Status: AC | PRN
  Administered 2021-02-06: 12.2 via INTRAVENOUS

## 2021-02-06 NOTE — Progress Notes (Signed)
Has armband been applied?  Yes.    Does patient have an allergy to IV contrast dye?: No.   Has patient ever received premedication for IV contrast dye?: No.   Does patient take metformin?: No.  Date of lab work: January 22, 2021 BUN: 13 CR: 1.22  IV site: forearm left, condition patent and no redness  Has IV site been added to flowsheet?  Yes.    BP 116/75 (BP Location: Left Arm, Patient Position: Sitting)   Pulse (!) 105   Temp (!) 96.4 F (35.8 C) (Temporal)   Resp 19   Wt 247 lb 2 oz (112.1 kg)   SpO2 100%   BMI 36.49 kg/m

## 2021-02-10 ENCOUNTER — Ambulatory Visit: Payer: Medicaid Other | Admitting: Radiation Oncology

## 2021-02-10 ENCOUNTER — Ambulatory Visit: Payer: Medicaid Other

## 2021-02-10 DIAGNOSIS — Z510 Encounter for antineoplastic radiation therapy: Secondary | ICD-10-CM | POA: Diagnosis not present

## 2021-02-12 ENCOUNTER — Other Ambulatory Visit: Payer: Self-pay

## 2021-02-12 ENCOUNTER — Ambulatory Visit
Admission: RE | Admit: 2021-02-12 | Discharge: 2021-02-12 | Disposition: A | Discharge status: Home / Self Care | Payer: Medicaid Other | Source: Ambulatory Visit | Attending: Radiation Oncology | Admitting: Radiation Oncology

## 2021-02-12 DIAGNOSIS — Z510 Encounter for antineoplastic radiation therapy: Secondary | ICD-10-CM | POA: Diagnosis not present

## 2021-02-13 ENCOUNTER — Ambulatory Visit
Admission: RE | Admit: 2021-02-13 | Discharge: 2021-02-13 | Disposition: A | Discharge status: Home / Self Care | Payer: Medicaid Other | Source: Ambulatory Visit | Attending: Radiation Oncology | Admitting: Radiation Oncology

## 2021-02-13 DIAGNOSIS — Z510 Encounter for antineoplastic radiation therapy: Secondary | ICD-10-CM | POA: Diagnosis not present

## 2021-02-13 DIAGNOSIS — C8102 Nodular lymphocyte predominant Hodgkin lymphoma, intrathoracic lymph nodes: Secondary | ICD-10-CM

## 2021-02-13 MED ORDER — SONAFINE EX EMUL
1.0000 "application " | Freq: Two times a day (BID) | CUTANEOUS | Status: DC
  Administered 2021-02-13: 1 via TOPICAL

## 2021-02-13 NOTE — Progress Notes (Signed)
Pt here for patient teaching.  Pt given Radiation and You booklet, skin care instructions and Sonafine.  Reviewed areas of pertinence such as fatigue, hair loss, skin changes, throat changes, cough and shortness of breath . Pt able to give teach back of to pat skin and use unscented/gentle soap,apply Sonafine bid and avoid applying anything to skin within 4 hours of treatment. Pt demonstrated understanding, of information given and will contact nursing with any questions or concerns.     [email or web address]

## 2021-02-16 ENCOUNTER — Ambulatory Visit
Admission: RE | Admit: 2021-02-16 | Discharge: 2021-02-16 | Disposition: A | Discharge status: Home / Self Care | Payer: Medicaid Other | Source: Ambulatory Visit | Attending: Radiation Oncology | Admitting: Radiation Oncology

## 2021-02-16 ENCOUNTER — Other Ambulatory Visit: Payer: Self-pay

## 2021-02-16 DIAGNOSIS — Z510 Encounter for antineoplastic radiation therapy: Secondary | ICD-10-CM | POA: Diagnosis not present

## 2021-02-17 ENCOUNTER — Ambulatory Visit
Admission: RE | Admit: 2021-02-17 | Discharge: 2021-02-17 | Disposition: A | Discharge status: Home / Self Care | Payer: Medicaid Other | Source: Ambulatory Visit | Attending: Radiation Oncology | Admitting: Radiation Oncology

## 2021-02-17 DIAGNOSIS — Z510 Encounter for antineoplastic radiation therapy: Secondary | ICD-10-CM | POA: Diagnosis not present

## 2021-02-18 ENCOUNTER — Ambulatory Visit: Payer: Medicaid Other | Admitting: Radiation Oncology

## 2021-02-19 ENCOUNTER — Ambulatory Visit
Admission: RE | Admit: 2021-02-19 | Discharge: 2021-02-19 | Disposition: A | Discharge status: Home / Self Care | Payer: Medicaid Other | Source: Ambulatory Visit | Attending: Radiation Oncology | Admitting: Radiation Oncology

## 2021-02-19 ENCOUNTER — Other Ambulatory Visit: Payer: Self-pay

## 2021-02-19 DIAGNOSIS — Z510 Encounter for antineoplastic radiation therapy: Secondary | ICD-10-CM | POA: Diagnosis not present

## 2021-02-20 ENCOUNTER — Ambulatory Visit
Admission: RE | Admit: 2021-02-20 | Discharge: 2021-02-20 | Disposition: A | Discharge status: Home / Self Care | Payer: Medicaid Other | Source: Ambulatory Visit | Attending: Radiation Oncology | Admitting: Radiation Oncology

## 2021-02-20 DIAGNOSIS — Z510 Encounter for antineoplastic radiation therapy: Secondary | ICD-10-CM | POA: Diagnosis not present

## 2021-02-21 IMAGING — DX DG CHEST 1V PORT
1 series · 1 of 1 positions shown · non-contrast
Comparison: 12/16/2020

CLINICAL DATA: Status post thoracoscopy

EXAM:
PORTABLE CHEST 1 VIEW

[chest]
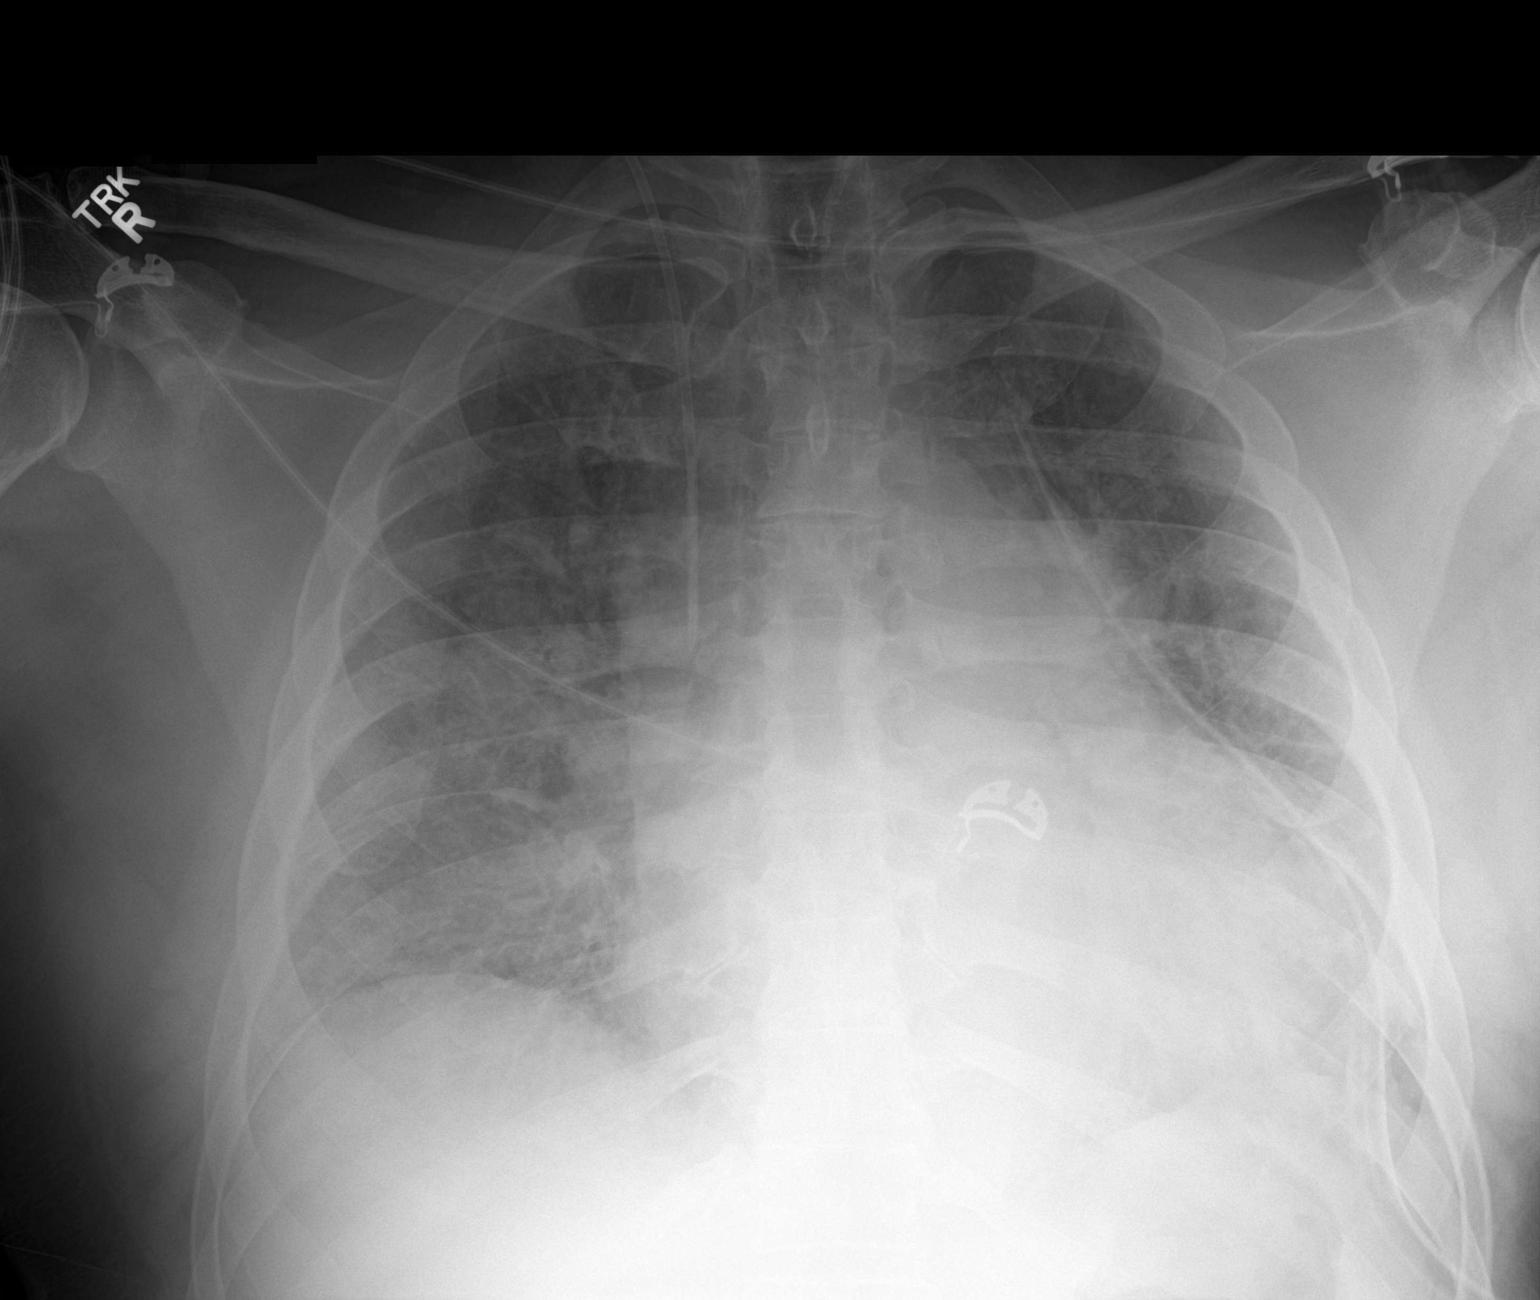

[1 of 1 positions shown; findings below may reference images not displayed]

FINDINGS: Cardiac shadow is enlarged but stable. Right jugular central line is
in the superior vena cava. Left-sided chest tube is noted in place.
No pneumothorax is seen. Generalized increased density is noted
throughout both lungs likely related to mild edema and vascular
congestion. No other focal abnormality is noted.
IMPRESSION: No evidence of pneumothorax.

Tubes and lines as described.

Changes consistent with mild pulmonary edema.

## 2021-02-23 ENCOUNTER — Other Ambulatory Visit: Payer: Self-pay

## 2021-02-23 ENCOUNTER — Ambulatory Visit
Admission: RE | Admit: 2021-02-23 | Discharge: 2021-02-23 | Disposition: A | Discharge status: Home / Self Care | Payer: Medicaid Other | Source: Ambulatory Visit | Attending: Radiation Oncology | Admitting: Radiation Oncology

## 2021-02-23 DIAGNOSIS — Z510 Encounter for antineoplastic radiation therapy: Secondary | ICD-10-CM | POA: Diagnosis not present

## 2021-02-24 ENCOUNTER — Ambulatory Visit
Admission: RE | Admit: 2021-02-24 | Discharge: 2021-02-24 | Disposition: A | Discharge status: Home / Self Care | Payer: Medicaid Other | Source: Ambulatory Visit | Attending: Radiation Oncology | Admitting: Radiation Oncology

## 2021-02-24 DIAGNOSIS — Z510 Encounter for antineoplastic radiation therapy: Secondary | ICD-10-CM | POA: Diagnosis not present

## 2021-02-25 ENCOUNTER — Other Ambulatory Visit: Payer: Self-pay

## 2021-02-25 ENCOUNTER — Ambulatory Visit
Admission: RE | Admit: 2021-02-25 | Discharge: 2021-02-25 | Disposition: A | Discharge status: Home / Self Care | Payer: Medicaid Other | Source: Ambulatory Visit | Attending: Radiation Oncology | Admitting: Radiation Oncology

## 2021-02-25 DIAGNOSIS — Z510 Encounter for antineoplastic radiation therapy: Secondary | ICD-10-CM | POA: Diagnosis not present

## 2021-02-26 ENCOUNTER — Ambulatory Visit
Admission: RE | Admit: 2021-02-26 | Discharge: 2021-02-26 | Disposition: A | Discharge status: Home / Self Care | Payer: Medicaid Other | Source: Ambulatory Visit | Attending: Radiation Oncology | Admitting: Radiation Oncology

## 2021-02-26 DIAGNOSIS — Z510 Encounter for antineoplastic radiation therapy: Secondary | ICD-10-CM | POA: Diagnosis not present

## 2021-02-27 ENCOUNTER — Ambulatory Visit (HOSPITAL_COMMUNITY)
Admission: EM | Admit: 2021-02-27 | Discharge: 2021-02-27 | Disposition: A | Discharge status: Other Acute Inpatient Hospital | Payer: Medicaid Other | Attending: Marriage and Family Therapist | Admitting: Marriage and Family Therapist

## 2021-02-27 ENCOUNTER — Other Ambulatory Visit: Payer: Self-pay

## 2021-02-27 ENCOUNTER — Encounter (HOSPITAL_COMMUNITY): Payer: Self-pay

## 2021-02-27 ENCOUNTER — Emergency Department (HOSPITAL_COMMUNITY)
Admission: EM | Admit: 2021-02-27 | Discharge: 2021-03-03 | Disposition: A | Discharge status: Home / Self Care | Payer: Medicaid Other | Attending: Emergency Medicine | Admitting: Emergency Medicine

## 2021-02-27 ENCOUNTER — Ambulatory Visit
Admission: RE | Admit: 2021-02-27 | Discharge: 2021-02-27 | Disposition: A | Discharge status: Home / Self Care | Payer: Medicaid Other | Source: Ambulatory Visit | Attending: Radiation Oncology | Admitting: Radiation Oncology

## 2021-02-27 DIAGNOSIS — Z8616 Personal history of COVID-19: Secondary | ICD-10-CM | POA: Diagnosis not present

## 2021-02-27 DIAGNOSIS — E119 Type 2 diabetes mellitus without complications: Secondary | ICD-10-CM | POA: Insufficient documentation

## 2021-02-27 DIAGNOSIS — F3162 Bipolar disorder, current episode mixed, moderate: Secondary | ICD-10-CM | POA: Diagnosis not present

## 2021-02-27 DIAGNOSIS — R45851 Suicidal ideations: Secondary | ICD-10-CM | POA: Insufficient documentation

## 2021-02-27 DIAGNOSIS — I5033 Acute on chronic diastolic (congestive) heart failure: Secondary | ICD-10-CM | POA: Diagnosis not present

## 2021-02-27 DIAGNOSIS — F17210 Nicotine dependence, cigarettes, uncomplicated: Secondary | ICD-10-CM | POA: Insufficient documentation

## 2021-02-27 DIAGNOSIS — Z20822 Contact with and (suspected) exposure to covid-19: Secondary | ICD-10-CM | POA: Insufficient documentation

## 2021-02-27 DIAGNOSIS — Z794 Long term (current) use of insulin: Secondary | ICD-10-CM | POA: Diagnosis not present

## 2021-02-27 DIAGNOSIS — I110 Hypertensive heart disease with heart failure: Secondary | ICD-10-CM | POA: Diagnosis not present

## 2021-02-27 DIAGNOSIS — Z79899 Other long term (current) drug therapy: Secondary | ICD-10-CM | POA: Diagnosis not present

## 2021-02-27 DIAGNOSIS — Z5989 Other problems related to housing and economic circumstances: Secondary | ICD-10-CM

## 2021-02-27 DIAGNOSIS — R4589 Other symptoms and signs involving emotional state: Secondary | ICD-10-CM

## 2021-02-27 DIAGNOSIS — F333 Major depressive disorder, recurrent, severe with psychotic symptoms: Secondary | ICD-10-CM | POA: Diagnosis not present

## 2021-02-27 NOTE — ED Provider Notes (Signed)
[facility] EMERGENCY DEPARTMENT Provider Note   CSN: [id number] Arrival date & time: 02/27/21  2226     History Chief Complaint  Patient presents with  . Suicidal    Charles [name] is a 55 y.o. male.  Here from [facility], with SI and depression, auditory hallucinations. He has mobility issues that prevent safe observation at [facility] and was transferred here to await reassessment by psychiatry in the morning.         Past Medical History:  Diagnosis Date  . Arthritis    lower back  . Bipolar 1 disorder ([place])   . Cardiomyopathy, nonischemic ([place])    no CAD 10/14/20 cath, EF 20-25% by 10/11/20 echo  . CHF (congestive heart failure) ([place])   . COVID 10/2020  . Depression   . DM2 (diabetes mellitus, type 2) ([place])   . HTN (hypertension)   . Myocardial infarction [facility])     Patient Active Problem List   Diagnosis Date Noted  . Acute hypoxemic respiratory failure ([place]) 01/13/2021  . Acute on chronic heart failure with reduced ejection fraction and diastolic dysfunction ([place]) 01/12/2021  . Schizophrenia ([place]) 01/04/2021  . Diarrhea 01/04/2021  . Pulmonary edema   . Mediastinal adenopathy 12/18/2020  . Adjustment disorder with mixed anxiety and depressed mood 10/30/2020  . Mediastinal lymphadenopathy 10/11/2020  . Shortness of breath 10/11/2020  . Sinus tachycardia 10/11/2020  . Night sweats 10/11/2020  . DM2 (diabetes mellitus, type 2) ([place]) 10/11/2020  . 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 Hypertension 09/25/2020  . BPH (benign prostatic hyperplasia) 09/25/2020  . Insomnia 09/25/2020    Past Surgical History:  Procedure Laterality Date  . BACK SURGERY  01/2020  . CARDIAC CATHETERIZATION  10/2020  . CHOLECYSTECTOMY    . HERNIA REPAIR    . INTERCOSTAL NERVE BLOCK Left 12/18/2020   Procedure: INTERCOSTAL NERVE BLOCK;  Surgeon: Melrose Nakayama, MD;  Location: [place];  Service: Thoracic;   Laterality: Left;  . LYMPH NODE BIOPSY Left 12/18/2020   Procedure: LYMPH NODE BIOPSY;  Surgeon: Melrose Nakayama, MD;  Location: [place];  Service: Thoracic;  Laterality: Left;  . RIGHT/LEFT HEART CATH AND CORONARY ANGIOGRAPHY N/A 10/14/2020   Procedure: RIGHT/LEFT HEART CATH AND CORONARY ANGIOGRAPHY;  Surgeon: Jettie Booze, MD;  Location: [place] [facility];  Service: Cardiovascular;  Laterality: N/A;  . SPINAL FIXATION SURGERY  02/26/2020  . XI ROBOTIC ASSISTED THORASCOPY FOR BIOPSY AP WINDOW LYMPH NODES (Left)  12/18/2020       Family History  Problem Relation Age of Onset  . Hypertension Mother   . Hyperlipidemia Mother   . Stroke Mother   . Cancer Mother   . Hypertension Father   . Hyperlipidemia Father   . Healthy Sister     Social History   Tobacco Use  . Smoking status: Current Some Day Smoker    Packs/day: 0.25    Types: Cigarettes  . Smokeless tobacco: Never Used  . Tobacco comment: occasionally  Vaping Use  . Vaping Use: Never used  Substance Use Topics  . Alcohol use: Never  . Drug use: Not Currently    Types: Cocaine    Home Medications Prior to Admission medications   Medication Sig Start Date End Date Taking? Authorizing Provider  acetaminophen (TYLENOL) 325 MG tablet Take 650 mg by mouth every 6 (six) hours as needed for moderate pain.    [provider]  atorvastatin (LIPITOR) 10 MG  tablet Take 10 mg by mouth daily. 09/30/20   [provider]  blood glucose meter kit and supplies KIT Dispense based on patient and insurance preference. Use up to four times daily as directed. (FOR ICD-9 250.00, 250.01). 10/23/20   Barb Merino, MD  brimonidine (ALPHAGAN) 0.2 % ophthalmic solution Place 1 drop into both eyes 2 (two) times daily.     [provider]  cyclobenzaprine (FLEXERIL) 10 MG tablet Take 10 mg by mouth 3 (three) times daily as needed for muscle spasms.    [provider]  dapagliflozin propanediol  (FARXIGA) 10 MG TABS tablet Take 1 tablet (10 mg total) by mouth daily. 10/23/20   Barb Merino, MD  digoxin (LANOXIN) 0.125 MG tablet Take 1 tablet (125 mcg total) by mouth daily. 12/29/20 12/29/21  Darrick Grinder D, NP  furosemide (LASIX) 40 MG tablet Take 1 tablet (40 mg total) by mouth daily. 01/16/21   [name], MD  insulin detemir (LEVEMIR) 100 UNIT/ML injection Inject 25 Units into the skin daily.    [provider]  Insulin Pen Needle (PEN NEEDLES 29GX1/2") 29G X 12MM MISC 1 each by Does not apply route daily. 10/23/20   Barb Merino, MD  latanoprost (XALATAN) 0.005 % ophthalmic solution Place 1 drop into both eyes at bedtime.     [provider]  levothyroxine (SYNTHROID) 100 MCG tablet Take 100 mcg by mouth daily before breakfast.    [provider]  melatonin 3 MG TABS tablet Take 3 mg by mouth at bedtime. 09/08/20   [provider]  metoprolol succinate (TOPROL XL) 100 MG 24 hr tablet Take 1 tablet (100 mg total) by mouth daily. Take with or immediately following a meal. 01/22/21 01/22/22  Maudie Mercury, MD  polyethylene glycol (MIRALAX / GLYCOLAX) 17 g packet Take 17 g by mouth 2 (two) times daily. 10/23/20   Barb Merino, MD  risperiDONE (RISPERDAL) 0.5 MG tablet Take 1 tablet (0.5 mg total) by mouth at bedtime. 12/14/20   Lucrezia Starch, MD  sacubitril-valsartan (ENTRESTO) 24-26 MG Take 1 tablet by mouth 2 (two) times daily.    [provider]  senna (SENOKOT) 8.6 MG TABS tablet Take 2 tablets (17.2 mg total) by mouth 2 (two) times daily. 10/23/20   Barb Merino, MD  spironolactone (ALDACTONE) 25 MG tablet Take 0.5 tablets (12.5 mg total) by mouth daily. Patient taking differently: Take 25 mg by mouth daily. 01/07/21   Madalyn Rob, MD  tamsulosin (FLOMAX) 0.4 MG CAPS capsule Take 0.4 mg by mouth daily. 09/08/20   [provider]    Allergies    Ibuprofen and Penicillins  Review of Systems   Review of Systems  Physical  Exam Updated Vital Signs Ht 5' 9"  (1.753 m)   Wt 112.1 kg   BMI 36.50 kg/m   Physical Exam  ED Results / Procedures / Treatments   Labs (all labs ordered are listed, but only abnormal results are displayed) Labs Reviewed - No data to display  EKG None  Radiology No results found.  Procedures Procedures   Medications Ordered in ED Medications - No data to display  ED Course  I have reviewed the triage vital signs and the nursing notes.  Pertinent labs & imaging results that were available during my care of the patient were reviewed by me and considered in my medical decision making (see chart for details).    MDM Rules/Calculators/A&P  Patient sent here from [facility], unable to stay there secondary to mobility issues. Has been evaluated by psych and sent here for observation until am for psych re-eval.   [name] cooperative, calm, voluntary.   Disposition to be determine with the assistance of psychiatry team.   Final Clinical Impression(s) / ED Diagnoses Final diagnoses:  None   1. Depression 2. SI  Rx / DC Orders ED Discharge Orders    None       Dennie Bible 02/28/21 [place], April, MD 02/28/21 [id number]

## 2021-02-27 NOTE — ED Notes (Signed)
Report called to Charge Nurse Louretta Parma at [facility].  Accepting MD Dr Lorre Munroe.  Comptroller.

## 2021-02-27 NOTE — ED Triage Notes (Signed)
Patient was seen at [facility] and recommended for overnight psych observation, due to walker needs he was sent here with safe transport

## 2021-02-27 NOTE — ED Provider Notes (Addendum)
Behavioral Health Urgent Care Medical Screening Exam  Patient Name: Charles [name] MRN: [id number] Date of Evaluation: 02/28/21 Chief Complaint:   Diagnosis:  Final diagnoses:  Suicidal ideation  Bipolar 1 disorder, mixed, moderate ([place])    History of Present illness: RIDER ERMIS is a 55 y.o. male. Patient presented to [facility] voluntarily via Event organiser. Patient presented with chief complaint of suicidal ideation and worsening auditory hallucination due to "increased stress and pressure." patient report "I'm under a lot of pressure and stress because of my health, my finances, and the doctors are not taking time to explain my cancer diagnosis and treatment to me." He endorse SI with plans to "jump out of the window and auditory hallucination of "voices talking about hurting someone so I can relief pressure."   Patient was assessed face to face in sally port. [name] alert and orient X4, calm and cooperative; patient is insightful, his speech is clear and coherent. Thought process is coherent, mood is depressed, affect is congruent with mood.  He denies SOB, chest pain/discomfort, fever, chills, recent illness, dizziness,  and acute distress.   He Patient continue to endorse SI and HI. [name] unable to contract for safety. Patient will need overnight observation for continuous assessment for safety.   Based on patient's current physical condition and mobility needs (BLE weakness and ambulates with a walker), patient will be transferred to [facility]  because [facility] is unable to admit patient with walker. Pt will be  Transported via safe transport to [facility] for overnight observation with reassessment by psychiatry on 02/28/2021   Psychiatric Specialty Exam  Presentation  General Appearance:Disheveled  Eye Contact:Poor  Speech:Clear and Coherent; Pressured; Normal Rate  Speech Volume:Increased  Handedness:Right   Mood and Affect  Mood:Depressed; Anxious; Hopeless  Affect:Congruent;  Depressed   Thought Process  Thought Processes:Coherent  Descriptions of Associations:Intact  Orientation:Full (Time, Place and Person)  Thought Content:Illogical  Diagnosis of Schizophrenia or Schizoaffective disorder in past: Yes   Hallucinations:Auditory states, "these constant voices in my head wont stop! They are tellime me to cut, burn, take pills do what ever it takes to end it!"  Ideas of Reference:None  Suicidal Thoughts:Yes, Active With Intent; With Plan; With Means to [place]  Homicidal Thoughts:No Without Plan   Sensorium  Memory:Immediate Good; Recent Good; Remote Good  Judgment:Poor  Insight:Fair   Executive Functions  Concentration:Fair  Attention Span:Fair  [place]  Language:Good   Psychomotor Activity  Psychomotor Activity:Normal   Assets  Assets:Communication Skills; Desire for Improvement; Resilience; Housing; Financial Resources/Insurance   Sleep  Sleep:Poor  Number of hours: 5   No data recorded  Physical Exam: Physical Exam Constitutional:      General: He is not in acute distress.    Appearance: He is obese. He is not toxic-appearing.  HENT:     Head: Normocephalic.     Nose: Nose normal.  Eyes:     General:        Right eye: No discharge.        Left eye: No discharge.  Cardiovascular:     Rate and Rhythm: Tachycardia present.  Pulmonary:     Effort: Respiratory distress present.  Abdominal:     Tenderness: There is no right CVA tenderness or left CVA tenderness.  Musculoskeletal:        General: Swelling present.     Cervical back: No rigidity.     Right lower leg: Edema present.  Left lower leg: Edema present.  Neurological:     Mental Status: He is alert and oriented to person, place, and time.  Psychiatric:        Attention and Perception: Attention normal. He perceives auditory hallucinations. He does not perceive visual hallucinations.        Mood and Affect: Mood is  depressed.        Speech: Speech normal.        Behavior: Behavior normal. Behavior is cooperative.        Thought Content: Thought content is not paranoid. Thought content includes homicidal and suicidal ideation. Thought content includes suicidal plan. Thought content does not include homicidal plan.        Cognition and Memory: Cognition normal.        Judgment: Judgment normal.    Review of Systems  Constitutional: Negative for chills, fever and weight loss.  HENT: Negative.   Eyes: Negative.   Respiratory: Positive for cough. Negative for hemoptysis, sputum production and shortness of breath.   Cardiovascular: Positive for leg swelling. Negative for chest pain, palpitations, orthopnea and claudication.  Gastrointestinal: Negative for abdominal pain, constipation, heartburn, nausea and vomiting.  Genitourinary: Positive for frequency (patient is on lasix).  Musculoskeletal: Negative.   Skin: Negative for rash.  Neurological: Negative for dizziness, tingling, tremors, sensory change, speech change and headaches.  Psychiatric/Behavioral: Positive for depression, hallucinations and suicidal ideas. Negative for substance abuse. The patient is not nervous/anxious and does not have insomnia.    Blood pressure (!) 114/51, pulse (!) 108, temperature 98.6 F (37 C), resp. rate 18, SpO2 96 %. There is no height or weight on file to calculate BMI.  Musculoskeletal: Strength & Muscle Tone: within normal limits Gait & Station: normal Patient leans: Right   [place] [facility] Discharge Disposition for Follow up and Recommendations:  Patient will need overnight observation for continuous assessment for safety. Based on patient's current physical condition and mobility needs (BLE weakness and ambulates with a walker), patient will be transferred to [facility] via safe transport for overnight observation with reassessment by psychiatry on 02/28/2021   Ophelia Shoulder, NP 02/28/2021, 9:10 PM

## 2021-02-27 NOTE — ED Notes (Signed)
Safe Transport called 

## 2021-02-28 DIAGNOSIS — F333 Major depressive disorder, recurrent, severe with psychotic symptoms: Secondary | ICD-10-CM

## 2021-02-28 LAB — RAPID URINE DRUG SCREEN, HOSP PERFORMED
Amphetamines: NOT DETECTED
Barbiturates: NOT DETECTED
Benzodiazepines: NOT DETECTED
Cocaine: NOT DETECTED
Opiates: NOT DETECTED
Tetrahydrocannabinol: NOT DETECTED

## 2021-02-28 LAB — COMPREHENSIVE METABOLIC PANEL
ALT: 17 U/L (ref 0–44)
AST: 16 U/L (ref 15–41)
Albumin: 3.7 g/dL (ref 3.5–5.0)
Alkaline Phosphatase: 80 U/L (ref 38–126)
Anion gap: 10 (ref 5–15)
BUN: 9 mg/dL (ref 6–20)
CO2: 25 mmol/L (ref 22–32)
Calcium: 9 mg/dL (ref 8.9–10.3)
Chloride: 102 mmol/L (ref 98–111)
Creatinine, Ser: 1.52 mg/dL — ABNORMAL HIGH (ref 0.61–1.24)
GFR, Estimated: 53 mL/min — ABNORMAL LOW (ref >60)
Glucose, Bld: 108 mg/dL — ABNORMAL HIGH (ref 70–99)
Potassium: 4 mmol/L (ref 3.5–5.1)
Sodium: 137 mmol/L (ref 135–145)
Total Bilirubin: 0.8 mg/dL (ref 0.3–1.2)
Total Protein: 6.8 g/dL (ref 6.5–8.1)

## 2021-02-28 LAB — CBC WITH DIFFERENTIAL/PLATELET
Abs Immature Granulocytes: 0.03 10*3/uL (ref 0.00–0.07)
Basophils Absolute: 0 10*3/uL (ref 0.0–0.1)
Basophils Relative: 1 %
Eosinophils Absolute: 0.2 10*3/uL (ref 0.0–0.5)
Eosinophils Relative: 3 %
HCT: 46.6 % (ref 39.0–52.0)
Hemoglobin: 14.9 g/dL (ref 13.0–17.0)
Immature Granulocytes: 0 %
Lymphocytes Relative: 17 %
Lymphs Abs: 1.2 10*3/uL (ref 0.7–4.0)
MCH: 25.5 pg — ABNORMAL LOW (ref 26.0–34.0)
MCHC: 32 g/dL (ref 30.0–36.0)
MCV: 79.7 fL — ABNORMAL LOW (ref 80.0–100.0)
Monocytes Absolute: 0.6 10*3/uL (ref 0.1–1.0)
Monocytes Relative: 8 %
Neutro Abs: 5.3 10*3/uL (ref 1.7–7.7)
Neutrophils Relative %: 71 %
Platelets: 175 10*3/uL (ref 150–400)
RBC: 5.85 MIL/uL — ABNORMAL HIGH (ref 4.22–5.81)
RDW: 18.3 % — ABNORMAL HIGH (ref 11.5–15.5)
WBC: 7.4 10*3/uL (ref 4.0–10.5)
nRBC: 0 % (ref 0.0–0.2)

## 2021-02-28 LAB — ETHANOL: Alcohol, Ethyl (B): <10 mg/dL (ref <10)

## 2021-02-28 LAB — ACETAMINOPHEN LEVEL: Acetaminophen (Tylenol), Serum: <10 ug/mL — ABNORMAL LOW (ref 10–30)

## 2021-02-28 LAB — RESP PANEL BY RT-PCR (FLU A&B, COVID) ARPGX2
Influenza A by PCR: NEGATIVE
Influenza B by PCR: NEGATIVE
SARS Coronavirus 2 by RT PCR: NEGATIVE

## 2021-02-28 LAB — SALICYLATE LEVEL: Salicylate Lvl: <7 mg/dL — ABNORMAL LOW (ref 7.0–30.0)

## 2021-02-28 MED ORDER — SPIRONOLACTONE 12.5 MG HALF TABLET
12.5000 mg | ORAL_TABLET | Freq: Every day | ORAL | Status: DC
  Administered 2021-03-01 – 2021-03-03 (×3): 12.5 mg via ORAL
  Filled 2021-02-28 (×4): qty 1

## 2021-02-28 MED ORDER — POLYETHYLENE GLYCOL 3350 17 G PO PACK
17.0000 g | PACK | Freq: Two times a day (BID) | ORAL | Status: DC
  Filled 2021-02-28 (×4): qty 1

## 2021-02-28 MED ORDER — RISPERIDONE 0.5 MG PO TABS
0.5000 mg | ORAL_TABLET | Freq: Every day | ORAL | Status: DC
  Administered 2021-03-01 – 2021-03-03 (×3): 0.5 mg via ORAL
  Filled 2021-02-28 (×3): qty 1

## 2021-02-28 MED ORDER — RISPERIDONE 0.5 MG PO TABS
0.5000 mg | ORAL_TABLET | Freq: Every day | ORAL | Status: DC
  Filled 2021-02-28: qty 1

## 2021-02-28 MED ORDER — DAPAGLIFLOZIN PROPANEDIOL 10 MG PO TABS
10.0000 mg | ORAL_TABLET | Freq: Every day | ORAL | Status: DC
  Administered 2021-03-01 – 2021-03-03 (×3): 10 mg via ORAL
  Filled 2021-02-28 (×4): qty 1

## 2021-02-28 MED ORDER — ATORVASTATIN CALCIUM 10 MG PO TABS
10.0000 mg | ORAL_TABLET | Freq: Every day | ORAL | Status: DC
  Administered 2021-03-01 – 2021-03-03 (×3): 10 mg via ORAL
  Filled 2021-02-28 (×3): qty 1

## 2021-02-28 MED ORDER — FUROSEMIDE 20 MG PO TABS
40.0000 mg | ORAL_TABLET | Freq: Every day | ORAL | Status: DC
  Administered 2021-03-01 – 2021-03-03 (×3): 40 mg via ORAL
  Filled 2021-02-28 (×3): qty 2

## 2021-02-28 MED ORDER — MELATONIN 3 MG PO TABS
3.0000 mg | ORAL_TABLET | Freq: Every day | ORAL | Status: DC
  Administered 2021-03-01 – 2021-03-02 (×2): 3 mg via ORAL
  Filled 2021-02-28 (×3): qty 1

## 2021-02-28 MED ORDER — BRIMONIDINE TARTRATE 0.2 % OP SOLN
1.0000 [drp] | Freq: Two times a day (BID) | OPHTHALMIC | Status: DC
  Administered 2021-03-01 – 2021-03-03 (×4): 1 [drp] via OPHTHALMIC
  Filled 2021-02-28: qty 5

## 2021-02-28 MED ORDER — DIGOXIN 125 MCG PO TABS
125.0000 ug | ORAL_TABLET | Freq: Every day | ORAL | Status: DC
  Administered 2021-03-01 – 2021-03-03 (×3): 125 ug via ORAL
  Filled 2021-02-28 (×3): qty 1

## 2021-02-28 MED ORDER — SACUBITRIL-VALSARTAN 24-26 MG PO TABS
1.0000 | ORAL_TABLET | Freq: Two times a day (BID) | ORAL | Status: DC
  Administered 2021-03-01 – 2021-03-03 (×5): 1 via ORAL
  Filled 2021-02-28 (×7): qty 1

## 2021-02-28 MED ORDER — TAMSULOSIN HCL 0.4 MG PO CAPS
0.4000 mg | ORAL_CAPSULE | Freq: Every day | ORAL | Status: DC
  Administered 2021-03-01 – 2021-03-03 (×3): 0.4 mg via ORAL
  Filled 2021-02-28 (×3): qty 1

## 2021-02-28 MED ORDER — LEVOTHYROXINE SODIUM 100 MCG PO TABS
100.0000 ug | ORAL_TABLET | Freq: Every day | ORAL | Status: DC
  Administered 2021-03-02 – 2021-03-03 (×2): 100 ug via ORAL
  Filled 2021-02-28 (×2): qty 1

## 2021-02-28 MED ORDER — INSULIN DETEMIR 100 UNIT/ML ~~LOC~~ SOLN: 25.0000 [IU] | Freq: Every day | SUBCUTANEOUS | Status: DC

## 2021-02-28 MED ORDER — LATANOPROST 0.005 % OP SOLN
1.0000 [drp] | Freq: Every day | OPHTHALMIC | Status: DC
  Administered 2021-03-01 – 2021-03-02 (×2): 1 [drp] via OPHTHALMIC
  Filled 2021-02-28: qty 2.5

## 2021-02-28 MED ORDER — RISPERIDONE 1 MG PO TABS
1.0000 mg | ORAL_TABLET | Freq: Every day | ORAL | Status: DC
  Administered 2021-02-28 – 2021-03-02 (×3): 1 mg via ORAL
  Filled 2021-02-28 (×3): qty 1

## 2021-02-28 MED ORDER — METOPROLOL SUCCINATE ER 25 MG PO TB24
100.0000 mg | ORAL_TABLET | Freq: Every day | ORAL | Status: DC
  Administered 2021-03-01 – 2021-03-03 (×3): 100 mg via ORAL
  Filled 2021-02-28: qty 1
  Filled 2021-02-28 (×2): qty 4

## 2021-02-28 MED ORDER — RISPERIDONE 0.5 MG PO TABS
0.5000 mg | ORAL_TABLET | Freq: Three times a day (TID) | ORAL | Status: DC | PRN
  Filled 2021-02-28: qty 1

## 2021-02-28 NOTE — ED Notes (Signed)
Provided snacks and refreshment.

## 2021-02-28 NOTE — Consult Note (Signed)
Telepsych Consultation   Reason for Consult:  Psych evaluation r/t suicidal ideas Referring Physician:  Dr. Regenia Skeeter Location of Patient: [facility] Location of Provider: Other: [place]  Patient Identification: Charles [name] MRN:  [id number] Principal Diagnosis: MDD (major depressive disorder), recurrent, severe, with psychosis ([place]) Diagnosis:  Principal Problem:   MDD (major depressive disorder), recurrent, severe, with psychosis ([place])   Total Time spent with patient: 30 minutes  Subjective:   Charles [name] is a 55 y.o. male patient that was a walk in at the [facility] and was transported to [facility]  with suicidal ideas and auditory hallucinations.  HPI:   Charles [name], 55 y.o., male patient seen via tele health by this provider, consulted with Dr. Mallie Darting; and chart reviewed on 02/28/21.  On evaluation Charles [name] reports, "these constant voices in my head wont stop! They are tellime me to cut, burn, take pills do what ever it takes to end it!"  Reports symptoms started roughly two weeks ago and they have progressively gotten worse.   During evaluation Charles [name] is laying in bed with his hands and arms over his head. He states the voices in is head are constant. Reports "they tell me to kill myself over and over and over, I cant take it". He appears to be in no acute distress. He is alert, oriented x 2, anxious but cooperative. States his mood is "fucked up" and endorsed depression with congruent affect. He does not appear to be responding to internal/external stimuli or delusional thoughts. Denies visual hallucinations, but states he constantly has flashes in his head of "the things I could use to kill myself, different ways I could do it". Patient denies homicidal ideation and paranoia. Patient endorses suicidal thoughts with a plan/intent and means to carry out. States he has been going through a lot the past few weeks with health issues. Reports he has no hope and "I am just tired I just don't want to live any  more"  History of three suicide attempts. States his only support is his girlfriend. Reports he lives at [facility] center.   Patient could not contract for safety. States if he were to go home today he would hurt himself, "by any means I can get my hands on"   Past Psychiatric History: Schizophrenia, MDD,   Risk to Self:  yes Risk to Others:  no Prior Inpatient Therapy:  yes Prior Outpatient Therapy:  [facility]  Past Medical History:  Past Medical History:  Diagnosis Date  . Arthritis    lower back  . Bipolar 1 disorder ([place])   . Cardiomyopathy, nonischemic ([place])    no CAD 10/14/20 cath, EF 20-25% by 10/11/20 echo  . CHF (congestive heart failure) ([place])   . COVID 10/2020  . Depression   . DM2 (diabetes mellitus, type 2) ([place])   . HTN (hypertension)   . Myocardial infarction [facility])     Past Surgical History:  Procedure Laterality Date  . BACK SURGERY  01/2020  . CARDIAC CATHETERIZATION  10/2020  . CHOLECYSTECTOMY    . HERNIA REPAIR    . INTERCOSTAL NERVE BLOCK Left 12/18/2020   Procedure: INTERCOSTAL NERVE BLOCK;  Surgeon: Melrose Nakayama, MD;  Location: [place];  Service: Thoracic;  Laterality: Left;  . LYMPH NODE BIOPSY Left 12/18/2020   Procedure: LYMPH NODE BIOPSY;  Surgeon: Melrose Nakayama, MD;  Location: [place];  Service: Thoracic;  Laterality: Left;  . RIGHT/LEFT HEART CATH AND CORONARY ANGIOGRAPHY N/A 10/14/2020  Procedure: RIGHT/LEFT HEART CATH AND CORONARY ANGIOGRAPHY;  Surgeon: Jettie Booze, MD;  Location: [facility];  Service: Cardiovascular;  Laterality: N/A;  . SPINAL FIXATION SURGERY  02/26/2020  . XI ROBOTIC ASSISTED THORASCOPY FOR BIOPSY AP WINDOW LYMPH NODES (Left)  12/18/2020   Family History:  Family History  Problem Relation Age of Onset  . Hypertension Mother   . Hyperlipidemia Mother   . Stroke Mother   . Cancer Mother   . Hypertension Father   . Hyperlipidemia Father   . Healthy Sister    Family Psychiatric   History: unkown Social History:  Social History   Substance and Sexual Activity  Alcohol Use Never     Social History   Substance and Sexual Activity  Drug Use Not Currently  . Types: Cocaine    Social History   Socioeconomic History  . Marital status: Divorced    Spouse name: Not on file  . Number of children: 1  . Years of education: Not on file  . Highest education level: Not on file  Occupational History  . Not on file  Tobacco Use  . Smoking status: Current Some Day Smoker    Packs/day: 0.25    Types: Cigarettes  . Smokeless tobacco: Never Used  . Tobacco comment: occasionally  Vaping Use  . Vaping Use: Never used  Substance and Sexual Activity  . Alcohol use: Never  . Drug use: Not Currently    Types: Cocaine  . Sexual activity: Not Currently  Other Topics Concern  . Not on file  Social History Narrative  . Not on file   Social Determinants of Health   Financial Resource Strain: Low Risk   . Difficulty of Paying Living Expenses: Not hard at all  Food Insecurity: No Food Insecurity  . Worried About Charity fundraiser in the Last Year: Never true  . Ran Out of Food in the Last Year: Never true  Transportation Needs: No Transportation Needs  . Lack of Transportation (Medical): No  . Lack of Transportation (Non-Medical): No  Physical Activity: Insufficiently Active  . Days of Exercise per Week: 7 days  . Minutes of Exercise per Session: 10 min  Stress: No Stress Concern Present  . Feeling of Stress : Only a little  Social Connections: Not on file   Additional Social History:    Allergies:   Allergies  Allergen Reactions  . Ibuprofen     Unknown per MAR  . Penicillins Rash    Labs:  Results for orders placed or performed during the hospital encounter of 02/27/21 (from the past 48 hour(s))  CBC with Differential     Status: Abnormal   Collection Time: 02/28/21 12:01 AM  Result Value Ref Range   WBC 7.4 4.0 - 10.5 K/uL   RBC 5.85 (H) 4.22 -  5.81 MIL/uL   Hemoglobin 14.9 13.0 - 17.0 g/dL   HCT 46.6 39.0 - 52.0 %   MCV 79.7 (L) 80.0 - 100.0 fL   MCH 25.5 (L) 26.0 - 34.0 pg   MCHC 32.0 30.0 - 36.0 g/dL   RDW 18.3 (H) 11.5 - 15.5 %   Platelets 175 150 - 400 K/uL    Comment: REPEATED TO VERIFY   nRBC 0.0 0.0 - 0.2 %   Neutrophils Relative % 71 %   Neutro Abs 5.3 1.7 - 7.7 K/uL   Lymphocytes Relative 17 %   Lymphs Abs 1.2 0.7 - 4.0 K/uL   Monocytes Relative 8 %  Monocytes Absolute 0.6 0.1 - 1.0 K/uL   Eosinophils Relative 3 %   Eosinophils Absolute 0.2 0.0 - 0.5 K/uL   Basophils Relative 1 %   Basophils Absolute 0.0 0.0 - 0.1 K/uL   Immature Granulocytes 0 %   Abs Immature Granulocytes 0.03 0.00 - 0.07 K/uL    Comment: Performed at [facility] [address] [place], [place] [address]  Comprehensive metabolic panel     Status: Abnormal   Collection Time: 02/28/21 12:01 AM  Result Value Ref Range   Sodium 137 135 - 145 mmol/L   Potassium 4.0 3.5 - 5.1 mmol/L   Chloride 102 98 - 111 mmol/L   CO2 25 22 - 32 mmol/L   Glucose, Bld 108 (H) 70 - 99 mg/dL    Comment: Glucose reference range applies only to samples taken after fasting for at least 8 hours.   BUN 9 6 - 20 mg/dL   Creatinine, Ser 1.52 (H) 0.61 - 1.24 mg/dL   Calcium 9.0 8.9 - 10.3 mg/dL   Total Protein 6.8 6.5 - 8.1 g/dL   Albumin 3.7 3.5 - 5.0 g/dL   AST 16 15 - 41 U/L   ALT 17 0 - 44 U/L   Alkaline Phosphatase 80 38 - 126 U/L   Total Bilirubin 0.8 0.3 - 1.2 mg/dL   GFR, Estimated 53 (L) >60 mL/min    Comment: (NOTE) Calculated using the CKD-EPI Creatinine Equation (2021)    Anion gap 10 5 - 15    Comment: Performed at [facility] [address], [place], [place] [address]  Ethanol     Status: None   Collection Time: 02/28/21 12:01 AM  Result Value Ref Range   Alcohol, Ethyl (B) <10 <10 mg/dL    Comment: (NOTE) Lowest detectable limit for serum alcohol is 10 mg/dL.  For medical purposes only. Performed at [facility],  [place] [address] [place], [place] [address]   Salicylate level     Status: Abnormal   Collection Time: 02/28/21 12:01 AM  Result Value Ref Range   Salicylate Lvl <5.9 (L) 7.0 - 30.0 mg/dL    Comment: Performed at [facility] [address] [place], [place] [address]  Acetaminophen level     Status: Abnormal   Collection Time: 02/28/21 12:01 AM  Result Value Ref Range   Acetaminophen (Tylenol), Serum <10 (L) 10 - 30 ug/mL    Comment: (NOTE) Therapeutic concentrations vary significantly. A range of 10-30 ug/mL  may be an effective concentration for many patients. However, some  are best treated at concentrations outside of this range. Acetaminophen concentrations >150 ug/mL at 4 hours after ingestion  and >50 ug/mL at 12 hours after ingestion are often associated with  toxic reactions.  Performed at [facility], [place] [address], [place], [place] [address]   Rapid urine drug screen (hospital performed)     Status: None   Collection Time: 02/28/21 12:15 AM  Result Value Ref Range   Opiates NONE DETECTED NONE DETECTED   Cocaine NONE DETECTED NONE DETECTED   Benzodiazepines NONE DETECTED NONE DETECTED   Amphetamines NONE DETECTED NONE DETECTED   Tetrahydrocannabinol NONE DETECTED NONE DETECTED   Barbiturates NONE DETECTED NONE DETECTED    Comment: (NOTE) DRUG SCREEN FOR MEDICAL PURPOSES ONLY.  IF CONFIRMATION IS NEEDED FOR ANY PURPOSE, NOTIFY LAB WITHIN 5 DAYS.  LOWEST DETECTABLE LIMITS FOR URINE DRUG SCREEN Drug Class  Cutoff (ng/mL) Amphetamine and metabolites    1000 Barbiturate and metabolites    200 Benzodiazepine                 938 Tricyclics and metabolites     300 Opiates and metabolites        300 Cocaine and metabolites        300 THC                            50 Performed at [facility], [place] [address], [place], [place] [address]   Resp Panel by RT-PCR (Flu A&B, Covid) Nasopharyngeal Swab     Status: None   Collection Time:  02/28/21  8:17 AM   Specimen: Nasopharyngeal Swab; Nasopharyngeal(NP) swabs in vial transport medium  Result Value Ref Range   SARS Coronavirus 2 by RT PCR NEGATIVE NEGATIVE    Comment: (NOTE) SARS-CoV-2 target nucleic acids are NOT DETECTED.  The SARS-CoV-2 RNA is generally detectable in upper respiratory specimens during the acute phase of infection. The lowest concentration of SARS-CoV-2 viral copies this assay can detect is 138 copies/mL. A negative result does not preclude SARS-Cov-2 infection and should not be used as the sole basis for treatment or other patient management decisions. A negative result may occur with  improper specimen collection/handling, submission of specimen other than nasopharyngeal swab, presence of viral mutation(s) within the areas targeted by this assay, and inadequate number of viral copies(<138 copies/mL). A negative result must be combined with clinical observations, patient history, and epidemiological information. The expected result is Negative.  Fact Sheet for Patients:  EntrepreneurPulse.com.au  Fact Sheet for Healthcare Providers:  IncredibleEmployment.be  This test is no t yet approved or cleared by the [place] FDA and  has been authorized for detection and/or diagnosis of SARS-CoV-2 by FDA under an Emergency Use Authorization (EUA). This EUA will remain  in effect (meaning this test can be used) for the duration of the COVID-19 declaration under Section 564(b)(1) of the Act, 21 U.S.C.section 360bbb-3(b)(1), unless the authorization is terminated  or revoked sooner.       Influenza A by PCR NEGATIVE NEGATIVE   Influenza B by PCR NEGATIVE NEGATIVE    Comment: (NOTE) The Xpert Xpress SARS-CoV-2/FLU/RSV plus assay is intended as an aid in the diagnosis of influenza from Nasopharyngeal swab specimens and should not be used as a sole basis for treatment. Nasal washings and aspirates are  unacceptable for Xpert Xpress SARS-CoV-2/FLU/RSV testing.  Fact Sheet for Patients: EntrepreneurPulse.com.au  Fact Sheet for Healthcare Providers: IncredibleEmployment.be  This test is not yet approved or cleared by the [place] FDA and has been authorized for detection and/or diagnosis of SARS-CoV-2 by FDA under an Emergency Use Authorization (EUA). This EUA will remain in effect (meaning this test can be used) for the duration of the COVID-19 declaration under Section 564(b)(1) of the Act, 21 U.S.C. section 360bbb-3(b)(1), unless the authorization is terminated or revoked.  Performed at [facility], [place] [address] [place], [place] [address]     Medications:  No current facility-administered medications for this encounter.   Current Outpatient Medications  Medication Sig Dispense Refill  . acetaminophen (TYLENOL) 325 MG tablet Take 650 mg by mouth every 6 (six) hours as needed for moderate pain.    Marland Kitchen atorvastatin (LIPITOR) 10 MG tablet Take 10 mg by mouth daily.    . blood glucose meter kit and supplies KIT Dispense  based on patient and insurance preference. Use up to four times daily as directed. (FOR ICD-9 250.00, 250.01). 1 each 0  . brimonidine (ALPHAGAN) 0.2 % ophthalmic solution Place 1 drop into both eyes 2 (two) times daily.     . cyclobenzaprine (FLEXERIL) 10 MG tablet Take 10 mg by mouth 3 (three) times daily as needed for muscle spasms.    . dapagliflozin propanediol (FARXIGA) 10 MG TABS tablet Take 1 tablet (10 mg total) by mouth daily. 30 tablet 3  . digoxin (LANOXIN) 0.125 MG tablet Take 1 tablet (125 mcg total) by mouth daily. 30 tablet 11  . furosemide (LASIX) 40 MG tablet Take 1 tablet (40 mg total) by mouth daily. 30 tablet 1  . insulin detemir (LEVEMIR) 100 UNIT/ML injection Inject 25 Units into the skin daily.    . Insulin Pen Needle (PEN NEEDLES 29GX1/2") 29G X 12MM MISC 1 each by Does not apply route daily. 100  each 0  . latanoprost (XALATAN) 0.005 % ophthalmic solution Place 1 drop into both eyes at bedtime.     Marland Kitchen levothyroxine (SYNTHROID) 100 MCG tablet Take 100 mcg by mouth daily before breakfast.    . melatonin 3 MG TABS tablet Take 3 mg by mouth at bedtime.    . metoprolol succinate (TOPROL XL) 100 MG 24 hr tablet Take 1 tablet (100 mg total) by mouth daily. Take with or immediately following a meal. 30 tablet 11  . polyethylene glycol (MIRALAX / GLYCOLAX) 17 g packet Take 17 g by mouth 2 (two) times daily. 14 each 0  . risperiDONE (RISPERDAL) 0.5 MG tablet Take 1 tablet (0.5 mg total) by mouth at bedtime. 30 tablet 0  . sacubitril-valsartan (ENTRESTO) 24-26 MG Take 1 tablet by mouth 2 (two) times daily.    Marland Kitchen senna (SENOKOT) 8.6 MG TABS tablet Take 2 tablets (17.2 mg total) by mouth 2 (two) times daily. 120 tablet 0  . spironolactone (ALDACTONE) 25 MG tablet Take 0.5 tablets (12.5 mg total) by mouth daily. (Patient taking differently: Take 25 mg by mouth daily.) 30 tablet 0  . tamsulosin (FLOMAX) 0.4 MG CAPS capsule Take 0.4 mg by mouth daily.      Musculoskeletal: Strength & Muscle Tone: decreased Gait & Station: unsteady Patient leans: N/A  Psychiatric Specialty Exam: Physical Exam Vitals reviewed.  HENT:     Head: Normocephalic.     Right Ear: Tympanic membrane normal.     Left Ear: Tympanic membrane normal.     Nose: Nose normal.     Mouth/Throat:     Pharynx: Oropharynx is clear.  Eyes:     Conjunctiva/sclera: Conjunctivae normal.  Cardiovascular:     Rate and Rhythm: Normal rate.  Pulmonary:     Effort: Pulmonary effort is normal. No respiratory distress.  Abdominal:     Tenderness: There is no guarding.  Musculoskeletal:        General: Normal range of motion.     Cervical back: Normal range of motion.  Skin:    General: Skin is dry.  Neurological:     Mental Status: He is alert and oriented to person, place, and time.  Psychiatric:        Attention and Perception:  Attention normal.        Mood and Affect: Mood is anxious and depressed.        Speech: Speech is rapid and pressured.        Behavior: Behavior is withdrawn. Behavior is cooperative.  Thought Content: Thought content includes suicidal ideation. Thought content includes suicidal plan.        Cognition and Memory: Cognition normal.        Judgment: Judgment is impulsive.     Review of Systems  Blood pressure 93/62, pulse 72, temperature 98.7 F (37.1 C), temperature source Oral, resp. rate 17, height 5' 9"  (1.753 m), weight 112.1 kg, SpO2 95 %.Body mass index is 36.5 kg/m.  General Appearance: Disheveled  Eye Contact:  Poor  Speech:  Clear and Coherent and Pressured  Volume:  Increased  Mood:  Anxious, Depressed and Hopeless  Affect:  Congruent  Thought Process:  Coherent  Orientation:  Alert to person and pla  Thought Content:  Hallucinations: Auditory  Suicidal Thoughts:  Yes.  with intent/plan  Homicidal Thoughts:  No  Memory:  Immediate;   Good Recent;   Good Remote;   Good  Judgement:  Poor  Insight:  Fair  Psychomotor Activity:  Normal  Concentration:  Concentration: Fair and Attention Span: Fair  Recall:  Good  Fund of Knowledge:  Good  Language:  Good  Akathisia:  No  Handed:  Right  AIMS (if indicated):     Assets:  Communication Skills Desire for Improvement Financial Resources/Insurance Housing Resilience Social Support  ADL's:  Intact  Cognition:  WNL  Sleep:   poor     Treatment Plan Summary:  Daily contact with patient to assess and evaluate symptoms and progress in treatment and Medication management    Will increased Risperdal to 0.5 mg PO QAM, 1 mg PO QHS, 0.21m PO Q8H PRN severe agitation.     Disposition: Recommend psychiatric Inpatient admission.    This service was provided via telemedicine using a 2-way, interactive audio and video technology.  Names of all persons participating in this telemedicine service and their role in  this encounter. Name: Charles MullingRole: patient  Name: CRandall HissRole: NP  Name: Dr. CMallie DartingVia Telephone  Role: MD  Name: Role:     CRevonda Humphrey NP 02/28/2021 1:49 PM

## 2021-02-28 NOTE — ED Notes (Signed)
Patient belongs in visitor locker# 4 in purple locker room

## 2021-02-28 NOTE — Progress Notes (Signed)
Per Hazeline Junker, patient meets criteria for inpatient treatment. There are no available or appropriate beds at [facility] today. CSW faxed referrals to the following facilities for review:  [facility]  TTS will continue to seek bed placement.  Glennie Isle, MSW, [facility], LCAS-A Phone: [phone] Disposition/TOC

## 2021-02-28 NOTE — ED Notes (Signed)
This RN provided phone call for pt to call family

## 2021-02-28 NOTE — ED Notes (Signed)
Patient calm. Sleeping peacefully.

## 2021-02-28 NOTE — ED Notes (Signed)
Patient eating lunch tray, calm and cooperative at this time

## 2021-03-01 NOTE — ED Provider Notes (Signed)
Emergency Medicine Observation Re-evaluation Note  Charles [name] is a 55 y.o. male, seen on rounds today.  Pt initially presented to the ED for complaints of Suicidal Currently, the patient is resting. Is still suicidal and complains of voices. Asks about his home meds.  Physical Exam  BP 97/86 (BP Location: Left Arm)   Pulse 87   Temp 97.7 F (36.5 C) (Oral)   Resp 16   Ht 5\' 9"  (1.753 m)   Wt 112.1 kg   SpO2 95%   BMI 36.50 kg/m  Physical Exam General: lying on stretcher, no acute distress Cardiac: normal HR Lungs: normal effort Psych: suicidal  ED Course / MDM  EKG:   I have reviewed the labs performed to date as well as medications administered while in observation.  Recent changes in the last 24 hours include his home meds being ordered last night, will start this morning. .  Plan  Current plan is for inpatient.    Sherwood Gambler, MD 03/01/21 [id number]

## 2021-03-01 NOTE — ED Notes (Signed)
Pt was updated about what he is waiting for, he remains cooperative.

## 2021-03-01 NOTE — ED Notes (Signed)
Pt awake and eating breakfast and drinking coffee  Calm and cooperative at this time

## 2021-03-01 NOTE — ED Notes (Signed)
Regular lunch tray ordered 

## 2021-03-01 NOTE — ED Provider Notes (Signed)
Patient expressed concern that he is supposed to go for chemotherapy in the morning.  He states he is getting daily chemotherapy for the next 2 weeks.  I have reviewed his records, and he is actually getting radiation therapy for lymphoma.  Radiation oncology is consulted to try to arrange for appropriate transfers for him to continue his radiation treatments.   Delora Fuel, MD 66/06/30 [id number]

## 2021-03-01 NOTE — ED Notes (Signed)
Pt taking shower at this time  

## 2021-03-01 NOTE — BH Assessment (Addendum)
Comprehensive Clinical Assessment (CCA) Note  03/01/2021 Charles [name] [id number]  Recommendations for Services/Supports/Treatments: Leandro Reasoner, NP, reviewed pt's chart and information and met with pt and determined pt should be observed overnight for safety and stability and re-assessed in the morning by psychiatry. Pt will be transported to the [name] due to pt using a walker.  The patient demonstrates the following risk factors for suicide: Chronic risk factors for suicide include: psychiatric disorder of MDD, medical illness of cancer and demographic factors (male, >23 y/o). Acute risk factors for suicide include: unemployment and social withdrawal/isolation. Protective factors for this patient include: a desire to improve his physical and mental health. Considering these factors, the overall suicide risk at this point appears to be none. [name] not appropriate for outpatient follow up.  Therefore, no supervision is necessary for suicide precautions.  Charles [name] from 02/27/2021 in Charles [name] from 02/03/2021 in [name] from 01/11/2021 in [place] No Risk No Risk Error: Question 6 not populated      Chief Complaint:  Chief Complaint  Patient presents with  . Urgent Emergent Eval   Visit Diagnosis: F33.1, Major depressive disorder, Recurrent episode, Moderate   CCA Screening, Triage and Referral (STR) Charles [name] is a 55 year old patient who was brought to the [facility]) via the PD after pt called requesting to be taken to the hospital due to [facility] and [place]. Pt states, "I'm dealing with too much stuff that even my mental health medication isn't working. I'm hearing voices and my emotions are getting [to the point] so I cry."  Pt shares he's currently living at [place] [facility]; he states he's been residing  in this home for 3 months and that he's unhappy with his care there. Pt states he hadn't previously been experiencing SI but states that, due to what's occurring, he's "starting to think about it." Pt endorses HI, denying a plan or a particular person he wants to harm, instead stating, "anybody, it don't matter." Pt denies VH, NSSIB, SH, engagement with the legal system, or SA.  Pt shares he was dx with cancer in January; he is currently undergoing radiation 5 days/week.  Pt is oriented x5. His recent/remote memory is intact. Pt was cooperative throughout the assessment process. Pt's insight, judgement, and impulse control is fair at this time.    Patient Reported Information How did you hear about [place]? Self  Referral name: Self  Referral phone number: 0 (N/A)   Whom do you see for routine medical problems? Primary Care  Practice/Facility Name: Not assessed  Practice/Facility Phone Number: 0 (Not assessed)  Name of Contact: Not assessed  Contact Number: Not assessed  Contact Fax Number: Not assessed  Prescriber Name: Not assessed  Prescriber Address (if known): Not assessed   What Is the Reason for Your Visit/Call Today? Pt states he's experiencing depression, is "starting to" experience SI, and has AH.  How Long Has This Been Causing You Problems? 1 wk - 1 month  What Do You Feel Would Help You the Most Today? Treatment for Depression or other mood problem   Have You Recently Been in Any Inpatient Treatment (Hospital/Detox/Crisis Center/28-Day Program)? No  Name/Location of Program/Hospital:No data recorded How Long Were You There? No data recorded When Were You Discharged? No data recorded  Have You Ever Received Services From [facility] Before? No  Who Do  You [facility]? Patient has been seen in the [name] and at the [facility]   Have You Recently Had Any Thoughts About [facility]? Yes  Are You Planning to Commit Suicide/Harm Yourself At This time?  No   Have you Recently Had Thoughts About [place]? Yes  Explanation: No data recorded  Have You Used Any Alcohol or Drugs in the Past 24 Hours? No  How Long Ago Did You Use Drugs or Alcohol? No data recorded What Did You Use and How Much? No data recorded  Do You Currently Have a Therapist/Psychiatrist? No  Name of Therapist/Psychiatrist: No data recorded  Have You Been Recently Discharged From Any Office Practice or Programs? No  Explanation of Discharge From Practice/Program: No data recorded    CCA Screening Triage Referral Assessment Type of Contact: Face-to-Face  Is this Initial or Reassessment? Initial Assessment  Date Telepsych consult ordered in [facility]:  02/03/2021  Time Telepsych consult ordered in [facility]:  0706   Patient Reported Information Reviewed? Yes  Patient Left Without Being Seen? No data recorded Reason for Not Completing Assessment: No data recorded  Collateral Involvement: Pt declined to provide verbal consent for clinician to make contact with friends/family for collateral information.   Does Patient Have a Stage manager Guardian? No data recorded Name and Contact of Legal Guardian: No data recorded If Minor and Not Living with Parent(s), Who has Custody? N/A  Is CPS involved or ever been involved? Never  Is APS involved or ever been involved? Never   Patient Determined To Be At Risk for Harm To Self or Others Based on Review of Patient Reported Information or Presenting Complaint? Yes, for Harm to Others  Method: No Plan  Availability of Means: No access or NA  Intent: Vague intent or NA  Notification Required: No need or identified person  Additional Information for Danger to Others Potential: No data recorded Additional Comments for Danger to Others Potential: Pt states he's been having thoughts of hurting "anybody, it don't matter."  Are There Guns or Other Weapons in Your Home? No  Types of Guns/Weapons: No data  recorded Are These Weapons Safely Secured?                            No data recorded Who Could Verify You Are Able To Have These Secured: No data recorded Do You Have any Outstanding Charges, Pending Court Dates, Parole/Probation? Pt denies  Contacted To Inform of Risk of Harm To Self or Others: Other: Comment (Pt's assisted living home)   Location of Assessment: [facility]   Does Patient Present under Involuntary Commitment? No  IVC Papers Initial File Date: No data recorded  [place] of Residence: [place]   Patient Currently Receiving the Following Services: Not Receiving Services   Determination of Need: Urgent (48 hours)   Options For Referral: Other: Comment (Overnight observation in the [name])     CCA Biopsychosocial Intake/Chief Complaint:  Pt states he's experiencing depression, is "starting to" experience SI, and has AH.  Current Symptoms/Problems: Depression, HI, AH, and some SI.   Patient Reported Schizophrenia/Schizoaffective Diagnosis in Past: Yes   Strengths: Not assessed  Preferences: Not assessed  Abilities: Not assessed   Type of Services Patient Feels are Needed: Not assessed   Initial Clinical Notes/Concerns: N/A   Mental Health Symptoms Depression:  Sleep (too much or little); Change in energy/activity; Increase/decrease in appetite   Duration  of Depressive symptoms: Greater than two weeks   Mania:  None   Anxiety:   Fatigue; Restlessness; Sleep; Tension; Worrying   Psychosis:  Hallucinations   Duration of Psychotic symptoms: Greater than six months   Trauma:  None   Obsessions:  None   Compulsions:  None   Inattention:  None   Hyperactivity/Impulsivity:  N/A   Oppositional/Defiant Behaviors:  None   Emotional Irregularity:  None   Other Mood/Personality Symptoms:  None noted    Mental Status Exam Appearance and self-care  Stature:  Average   Weight:  Average weight   Clothing:  Age-appropriate    Grooming:  Normal   Cosmetic use:  None   Posture/gait:  Normal; Other (Comment) (Patient requires a walker)   Motor activity:  Not Remarkable   Sensorium  Attention:  Normal   Concentration:  Normal   Orientation:  X5   Recall/memory:  Normal   Affect and Mood  Affect:  Anxious; Depressed   Mood:  Anxious; Depressed; Worthless; Hopeless   Relating  Eye contact:  Normal   Facial expression:  Depressed   Attitude toward examiner:  Cooperative   Thought and Language  Speech flow: Normal   Thought content:  Appropriate to Mood and Circumstances   Preoccupation:  None   Hallucinations:  Auditory   Organization:  No data recorded  [employer] of Knowledge:  Average   Intelligence:  Average   Abstraction:  Functional   Judgement:  Impaired   Reality Testing:  Realistic   Insight:  Fair   Decision Making:  Impulsive   Social Functioning  Social Maturity:  Isolates   Social Judgement:  Normal   Stress  Stressors:  Illness; Financial; Housing   Coping Ability:  Exhausted; Overwhelmed   Skill Deficits:  Self-control   Supports:  Support needed     Religion: Religion/Spirituality Are You A Religious Person?: Yes (not assessed) What is Your Religious Affiliation?: Catholic How Might This Affect Treatment?: Not assessed  Leisure/Recreation: Leisure / Recreation Do You Have Hobbies?: Yes Leisure and Hobbies: T.V  Exercise/Diet: Exercise/Diet Do You Exercise?: No Have You Gained or Lost A Significant Amount of Weight in the Past Six Months?: No Do You Follow a Special Diet?: Yes Type of Diet: Special diet due to heart condition Do You Have Any Trouble Sleeping?: Yes Explanation of Sleeping Difficulties: States that he has only been sleepign 4-5 hours/night   CCA Employment/Education Employment/Work Situation: Employment / Work Situation Employment situation: On disability Why is patient on disability: Mental health and  physical disability How long has patient been on disability: Since the 1990's Patient's job has been impacted by current illness:  (N/A) What is the longest time patient has a held a job?: Not assessed Where was the patient employed at that time?: Not assessed Has patient ever been in the [employer]?: No  Education: Education Is Patient Currently Attending School?: No Last Grade Completed: 9 Name of High School: Unknown high school in [place] Did You Graduate From [employer]?: No (patient did get his GED) Did [place]?: No Did You Attend Graduate School?: No Did You Have Any Special Interests In School?: None reported Did You Have An Individualized Education Program (IIEP): No Did You Have Any Difficulty At School?: No Patient's Education Has Been Impacted by Current Illness:  (N/A)   CCA Family/Childhood History Family and Relationship History: Family history Marital status: Divorced Divorced, when?: Not assessed What types of issues is patient dealing  with in the relationship?: Not assessed Additional relationship information: Not assessed Are you sexually active?:  (Not assessed) What is your sexual orientation?: Not assessed Has your sexual activity been affected by drugs, alcohol, medication, or emotional stress?: Not assessed Does patient have children?: Yes How many children?:  (Pt states he has 1-2 children; pt did not elaborate on this) How is patient's relationship with their children?: Not assessed  Childhood History:  Childhood History By whom was/is the patient raised?: Grandparents Additional childhood history information: Patient states that his mother had mental health issues Description of patient's relationship with caregiver when they were a child: Patient states that he was raised by his grandparents in what he describes as a good home Patient's description of current relationship with people who raised him/her: Not assessed How were you  disciplined when you got in trouble as a child/adolescent?: Not assessed Does patient have siblings?:  (Not assessed) Did patient suffer any verbal/emotional/physical/sexual abuse as a child?: No Did patient suffer from severe childhood neglect?: No Has patient ever been sexually abused/assaulted/raped as an adolescent or adult?: No Was the patient ever a victim of a crime or a disaster?: No Witnessed domestic violence?: No Has patient been affected by domestic violence as an adult?: No  Child/Adolescent Assessment:     CCA Substance Use Alcohol/Drug Use: Alcohol / Drug Use Pain Medications: see MAR Prescriptions: see MAR Over the Counter: see MAR History of alcohol / drug use?: No history of alcohol / drug abuse Longest period of sobriety (when/how long): N/A Negative Consequences of Use:  (N/A) Withdrawal Symptoms:  (N/A)                         ASAM's:  Six Dimensions of Multidimensional Assessment  Dimension 1:  Acute Intoxication and/or Withdrawal Potential:      Dimension 2:  Biomedical Conditions and Complications:      Dimension 3:  Emotional, Behavioral, or Cognitive Conditions and Complications:     Dimension 4:  Readiness to Change:     Dimension 5:  Relapse, Continued use, or Continued Problem Potential:     Dimension 6:  Recovery/Living Environment:     ASAM Severity Score:    ASAM Recommended Level of Treatment: ASAM Recommended Level of Treatment:  (N/A)   Substance use Disorder (SUD) Substance Use Disorder (SUD)  Checklist Symptoms of Substance Use:  (N/A)  Recommendations for Services/Supports/Treatments: Recommendations for Services/Supports/Treatments Recommendations For Services/Supports/Treatments: Medication Management,Individual Therapy,Other (Comment) (Overnight observation in the [name])  Leandro Reasoner, NP, reviewed pt's chart and information and met with pt and determined pt should be observed overnight for safety and stability and  re-assessed in the morning by psychiatry. Pt will be transported to the [name] due to pt using a walker.  DSM5 Diagnoses: Patient Active Problem List   Diagnosis Date Noted  . MDD (major depressive disorder), recurrent, severe, with psychosis ([place]) 02/28/2021  . Acute hypoxemic respiratory failure ([place]) 01/13/2021  . Acute on chronic heart failure with reduced ejection fraction and diastolic dysfunction ([place]) 01/12/2021  . Schizophrenia ([place]) 01/04/2021  . Diarrhea 01/04/2021  . Pulmonary edema   . Mediastinal adenopathy 12/18/2020  . Adjustment disorder with mixed anxiety and depressed mood 10/30/2020  . Mediastinal lymphadenopathy 10/11/2020  . Shortness of breath 10/11/2020  . Sinus tachycardia 10/11/2020  . Night sweats 10/11/2020  . DM2 (diabetes mellitus, type 2) ([place]) 10/11/2020  . 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  Hypertension 09/25/2020  . BPH (benign prostatic hyperplasia) 09/25/2020  . Insomnia 09/25/2020    Patient Centered Plan: [name] on the following Treatment Plan(s):  Depression   Referrals to Alternative Service(s): Referred to Alternative Service(s):   Place:   Date:   Time:    Referred to Alternative Service(s):   Place:   Date:   Time:    Referred to Alternative Service(s):   Place:   Date:   Time:    Referred to Alternative Service(s):   Place:   Date:   Time:     Dannielle Burn, LMFT

## 2021-03-02 ENCOUNTER — Other Ambulatory Visit: Payer: Self-pay

## 2021-03-02 ENCOUNTER — Ambulatory Visit
Admission: RE | Admit: 2021-03-02 | Discharge: 2021-03-02 | Disposition: A | Discharge status: Home / Self Care | Payer: Medicaid Other | Source: Ambulatory Visit | Attending: Radiation Oncology | Admitting: Radiation Oncology

## 2021-03-02 DIAGNOSIS — Z510 Encounter for antineoplastic radiation therapy: Secondary | ICD-10-CM | POA: Diagnosis not present

## 2021-03-02 NOTE — Progress Notes (Signed)
CSW contacted [facility]. CSW called twice and no one answered. CSW left an VM.

## 2021-03-02 NOTE — ED Notes (Signed)
Pt transferred to [facility] via [facility].  He is aware that he will be returning here.

## 2021-03-02 NOTE — Progress Notes (Signed)
Patient under review at [facility].  Assunta Curtis, MSW, LCSW 03/02/2021 11:24 AM

## 2021-03-02 NOTE — ED Notes (Signed)
Pt up to bathroom.

## 2021-03-02 NOTE — ED Notes (Signed)
Returned to room 50 from chemo at [facility] via [place]

## 2021-03-02 NOTE — Progress Notes (Signed)
Patient meets inpatient criteria per Thomes Lolling, NP.    Per [facility] no appropriate beds available at [facility].  Patient was referred to the following facilities:  Service Provider Address Phone [facility]  [address] [place] [address] [phone]  [place]  [address] [place] [address] [place]  [facility]  [address] [place] [address] [phone]  [facility]  [place], [place] [address] [phone]  [facility]  [place], [place] [address] [phone]  [facility]  [address] [place], [place] [address] [place]  [facility]  [address] [place] [address] [phone]  [facility]  [address] [place] [address] [phone]  [place] [address] [place] [address] [phone]  [facility]  [address], [place] [address] [phone]  [facility]  [address] [place] [address] [phone] [facility]  [address] [place] [address] [phone] [place]  [address], [place] [address] [phone]  [facility]  [place], [place] [address] [phone]  [facility]  [address] [place] [address] [phone] [address] [place] [address] [phone]      CSW will continue to monitor for disposition.  Assunta Curtis, MSW, LCSW 03/02/2021  11:31 AM

## 2021-03-02 NOTE — ED Provider Notes (Signed)
Emergency Medicine Observation Re-evaluation Note  Charles [name] is a 55 y.o. male, seen on rounds today.  Pt initially presented to the ED for complaints of Suicidal Currently, the patient is rest in bed. He had his radiation therapy today. No concerns at this time.  Physical Exam  BP 90/61 (BP Location: Right Wrist)   Pulse 88   Temp 99.1 F (37.3 C)   Resp 20   Ht 5\' 9"  (1.753 m)   Wt 112.1 kg   SpO2 91%   BMI 36.50 kg/m  Physical Exam General: well appearing Cardiac: normal rate Lungs: non-labored Psych: pleasant, calm  ED Course / MDM  EKG:   I have reviewed the labs performed to date as well as medications administered while in observation.  Recent changes in the last 24 hours include underwent radiation therapy earlier this AM  Plan  Current plan is for inpatient treatment. Will get radiation therapy daily with transport via [facility] [name] not under full IVC at this time.   Karie Kirks 03/02/21 1433    Valarie Merino, MD 03/06/21 [phone]

## 2021-03-02 NOTE — ED Notes (Signed)
Called [facility] to transport patient to [facility] for Chemotherapy. They state that they are currently working on other calls and can not guarantee they can get him to the appointment at 8:25am but they will try.

## 2021-03-02 NOTE — BH Assessment (Signed)
Patient will be seen this date by TTS staff or provider to assess current mental health status for ongoing needs.

## 2021-03-02 NOTE — ED Notes (Signed)
Patient given Snack and Drink.

## 2021-03-02 NOTE — ED Notes (Signed)
Pt given a phone call

## 2021-03-02 NOTE — ED Notes (Signed)
[facility] here to transport pt to chemotherapy at [facility]

## 2021-03-03 ENCOUNTER — Other Ambulatory Visit: Payer: Self-pay

## 2021-03-03 ENCOUNTER — Emergency Department (HOSPITAL_COMMUNITY)
Admission: EM | Admit: 2021-03-03 | Discharge: 2021-03-14 | Disposition: A | Discharge status: Home / Self Care | Payer: Medicaid Other | Attending: Emergency Medicine | Admitting: Emergency Medicine

## 2021-03-03 ENCOUNTER — Encounter (HOSPITAL_COMMUNITY): Payer: Self-pay

## 2021-03-03 ENCOUNTER — Ambulatory Visit
Admission: RE | Admit: 2021-03-03 | Discharge: 2021-03-03 | Disposition: A | Discharge status: Home / Self Care | Payer: Medicaid Other | Source: Ambulatory Visit | Attending: Radiation Oncology | Admitting: Radiation Oncology

## 2021-03-03 DIAGNOSIS — Z8616 Personal history of COVID-19: Secondary | ICD-10-CM | POA: Diagnosis not present

## 2021-03-03 DIAGNOSIS — R000 Tachycardia, unspecified: Secondary | ICD-10-CM | POA: Diagnosis not present

## 2021-03-03 DIAGNOSIS — F333 Major depressive disorder, recurrent, severe with psychotic symptoms: Secondary | ICD-10-CM

## 2021-03-03 DIAGNOSIS — F17210 Nicotine dependence, cigarettes, uncomplicated: Secondary | ICD-10-CM | POA: Diagnosis not present

## 2021-03-03 DIAGNOSIS — Z20822 Contact with and (suspected) exposure to covid-19: Secondary | ICD-10-CM | POA: Diagnosis not present

## 2021-03-03 DIAGNOSIS — E119 Type 2 diabetes mellitus without complications: Secondary | ICD-10-CM | POA: Insufficient documentation

## 2021-03-03 DIAGNOSIS — Z79899 Other long term (current) drug therapy: Secondary | ICD-10-CM | POA: Insufficient documentation

## 2021-03-03 DIAGNOSIS — R45851 Suicidal ideations: Secondary | ICD-10-CM | POA: Insufficient documentation

## 2021-03-03 DIAGNOSIS — I110 Hypertensive heart disease with heart failure: Secondary | ICD-10-CM | POA: Insufficient documentation

## 2021-03-03 DIAGNOSIS — I509 Heart failure, unspecified: Secondary | ICD-10-CM | POA: Diagnosis not present

## 2021-03-03 DIAGNOSIS — Z510 Encounter for antineoplastic radiation therapy: Secondary | ICD-10-CM | POA: Diagnosis not present

## 2021-03-03 DIAGNOSIS — Z794 Long term (current) use of insulin: Secondary | ICD-10-CM | POA: Insufficient documentation

## 2021-03-03 LAB — CBC WITH DIFFERENTIAL/PLATELET
Abs Immature Granulocytes: 0.02 10*3/uL (ref 0.00–0.07)
Basophils Absolute: 0 10*3/uL (ref 0.0–0.1)
Basophils Relative: 1 %
Eosinophils Absolute: 0.1 10*3/uL (ref 0.0–0.5)
Eosinophils Relative: 2 %
HCT: 48.2 % (ref 39.0–52.0)
Hemoglobin: 15.3 g/dL (ref 13.0–17.0)
Immature Granulocytes: 0 %
Lymphocytes Relative: 12 %
Lymphs Abs: 0.8 10*3/uL (ref 0.7–4.0)
MCH: 25.1 pg — ABNORMAL LOW (ref 26.0–34.0)
MCHC: 31.7 g/dL (ref 30.0–36.0)
MCV: 79 fL — ABNORMAL LOW (ref 80.0–100.0)
Monocytes Absolute: 0.5 10*3/uL (ref 0.1–1.0)
Monocytes Relative: 8 %
Neutro Abs: 5.5 10*3/uL (ref 1.7–7.7)
Neutrophils Relative %: 77 %
Platelets: 182 10*3/uL (ref 150–400)
RBC: 6.1 MIL/uL — ABNORMAL HIGH (ref 4.22–5.81)
RDW: 18.8 % — ABNORMAL HIGH (ref 11.5–15.5)
WBC: 7.1 10*3/uL (ref 4.0–10.5)
nRBC: 0 % (ref 0.0–0.2)

## 2021-03-03 LAB — RAPID URINE DRUG SCREEN, HOSP PERFORMED
Amphetamines: NOT DETECTED
Barbiturates: NOT DETECTED
Benzodiazepines: NOT DETECTED
Cocaine: NOT DETECTED
Opiates: NOT DETECTED
Tetrahydrocannabinol: NOT DETECTED

## 2021-03-03 LAB — COMPREHENSIVE METABOLIC PANEL
ALT: 20 U/L (ref 0–44)
AST: 18 U/L (ref 15–41)
Albumin: 4 g/dL (ref 3.5–5.0)
Alkaline Phosphatase: 82 U/L (ref 38–126)
Anion gap: 7 (ref 5–15)
BUN: 15 mg/dL (ref 6–20)
CO2: 27 mmol/L (ref 22–32)
Calcium: 9.3 mg/dL (ref 8.9–10.3)
Chloride: 104 mmol/L (ref 98–111)
Creatinine, Ser: 1.25 mg/dL — ABNORMAL HIGH (ref 0.61–1.24)
GFR, Estimated: >60 mL/min (ref >60)
Glucose, Bld: 121 mg/dL — ABNORMAL HIGH (ref 70–99)
Potassium: 4.5 mmol/L (ref 3.5–5.1)
Sodium: 138 mmol/L (ref 135–145)
Total Bilirubin: 0.7 mg/dL (ref 0.3–1.2)
Total Protein: 7.4 g/dL (ref 6.5–8.1)

## 2021-03-03 LAB — ETHANOL: Alcohol, Ethyl (B): <10 mg/dL (ref <10)

## 2021-03-03 NOTE — ED Triage Notes (Signed)
Pt states he feels like he is going to relapse on alcohol and drugs. Pt states he has auditory hallucinations. Pt states he is suicidal with a plan to cut himself and have someone beat him up.

## 2021-03-03 NOTE — ED Notes (Signed)
[facility] called  

## 2021-03-03 NOTE — ED Notes (Signed)
Pt back in ED via [facility] at this time.

## 2021-03-03 NOTE — ED Notes (Signed)
Pt transported with [facility] at this time to [place].

## 2021-03-03 NOTE — ED Notes (Signed)
Pt returns to department via [place].

## 2021-03-03 NOTE — Progress Notes (Signed)
[name] being considered at [facility] for an inpatient geropsych bed.  Admittance coordinator requested for the number to the ED he is located at.  SW provided patient nurse number for further follow up.    Carlisle Enke, LCSW, [place] Social Worker  [facility]

## 2021-03-03 NOTE — ED Triage Notes (Signed)
Emergency Medicine Provider Triage Evaluation Note  Charles [name] , a 55 y.o. male  was evaluated in triage.  Pt complains of SI.  Review of Systems  Positive: Drug addiction, SI, hallucination Negative: HI, active pain  Physical Exam  There were no vitals taken for this visit. Gen:   Awake, no distress   HEENT:  Atraumatic  Resp:  Normal effort  Cardiac:  Normal rate  Abd:   Nondistended, nontender  MSK:   Moves extremities without difficulty  PSYCH:  Flat affect Neuro:  Speech clear   Medical Decision Making  Medically screening exam initiated at 8:52 PM.  Appropriate orders placed.  Charles [name] was informed that the remainder of the evaluation will be completed by another provider, this initial triage assessment does not replace that evaluation, and the importance of remaining in the ED until their evaluation is complete.  Clinical Impression  Just released from [facility] today, here with desire to relapse on drugs and having thought about killing himself with plan to cut himself.     Domenic Moras, PA-C 03/03/21 2054

## 2021-03-03 NOTE — ED Notes (Signed)
Social worker in with pt.

## 2021-03-03 NOTE — Consult Note (Signed)
Telepsych Consultation   Reason for Consult:  Psych evaluation r/t suicidal ideas Referring Physician:  Dr. Regenia Skeeter Location of Patient: [facility] Location of Provider: Other: [facility]  Patient Identification: Charles [name] MRN:  [id number] Principal Diagnosis: MDD (major depressive disorder), recurrent, severe, with psychosis ([place]) Diagnosis:  Principal Problem:   MDD (major depressive disorder), recurrent, severe, with psychosis ([place])   Total Time spent with patient: 30 minutes  Subjective:   Charles [name] is a 55 y.o. male patient that was a walk in at the [facility] and was transported to [facility]  with suicidal ideas and auditory hallucinations.  HPI:  Charles [name], 55 y.o., male patient seen via tele health by this provider for psychiatric follow up assessment, consulted with Dr. Hampton Abbot; and chart reviewed on 03/03/21.  On evaluation Charles [name] reports he is feeling a little better.  States he no longer wants to kill himself but could "holler and cry because of so much that I'm dealing with."  Patient reporting his main stressor is the facility he is currently living in and wants to find his own place.  "I been there about 3 months but it ain't nothing but a money pit.  At least 2 that I know live there have AIDS, some have mental health issues like me, and others from what I've seen are just there to have a bed space and what little staff is there don't work.  They stay on their phone till they get there till they leave.  I have brought this to the higher up and they don't do nothing."  Patient states he wants his own place "I can take care of myself."  Patient states been looking for a place of his own or another facility.  Patient state he has outpatient psychiatric services "on that place on [address].  I don't have counseling yet but I want it."   Discussed having social work to come speak to him related resources needed for housing and to assist with getting set up for therapy.   During  evaluation Charles [name] is sitting on side of bed in no acute distress.  He is alert, oriented x 4, calm and cooperative.  His mood is dysphoric but stable with congruent affect.  He does not appear to be responding to internal/external stimuli or delusional thoughts, but states he is hearing noises that have improved but not gone away.  Patient denies suicidal/self-harm/homicidal ideation, and paranoia.  Patient insightful talking of future plans and trying to get in touch with family members that he has lost contact with and to find his own place. States he has a girlfriend that lives in [place] who is his primary support right now.  Patient answered question appropriately.  [name] able to contract for safety.    Safety Plan: Patient will call 911, mobile crisis, or reach out to staff if feels he will try to harm or kill himself Patient will follow up with current outpatient psychiatric provider or [facility] The suicide prevention education provided includes the following:  Suicide risk factors  Suicide prevention and interventions  National Suicide Hotline telephone number  [facility] assessment telephone number  [facility] Emergency Assistance [place] and/or Residential Mobile Crisis Unit telephone number   Request to Patient:  Remove weapons (e.g., guns, rifles, knives), all items previously/currently identified as safety concern.   Remove drugs/medications (over the counter, prescriptions, illicit drugs), all items previously/currently identified  as a safety concern.  Past Psychiatric History: Schizophrenia, MDD,  History of 3 suicide attempt   Risk to Self:  Denies Risk to Others:  Denies Prior Inpatient Therapy:  Yes Prior Outpatient Therapy:  No  Past Medical History:  Past Medical History:  Diagnosis Date  . Arthritis    lower back  . Bipolar 1 disorder ([place])   . Cardiomyopathy, nonischemic ([place])    no CAD 10/14/20 cath, EF  20-25% by 10/11/20 echo  . CHF (congestive heart failure) ([place])   . COVID 10/2020  . Depression   . DM2 (diabetes mellitus, type 2) ([place])   . HTN (hypertension)   . Myocardial infarction [facility])     Past Surgical History:  Procedure Laterality Date  . BACK SURGERY  01/2020  . CARDIAC CATHETERIZATION  10/2020  . CHOLECYSTECTOMY    . HERNIA REPAIR    . INTERCOSTAL NERVE BLOCK Left 12/18/2020   Procedure: INTERCOSTAL NERVE BLOCK;  Surgeon: Melrose Nakayama, MD;  Location: [facility];  Service: Thoracic;  Laterality: Left;  . LYMPH NODE BIOPSY Left 12/18/2020   Procedure: LYMPH NODE BIOPSY;  Surgeon: Melrose Nakayama, MD;  Location: [facility];  Service: Thoracic;  Laterality: Left;  . RIGHT/LEFT HEART CATH AND CORONARY ANGIOGRAPHY N/A 10/14/2020   Procedure: RIGHT/LEFT HEART CATH AND CORONARY ANGIOGRAPHY;  Surgeon: Jettie Booze, MD;  Location: [facility];  Service: Cardiovascular;  Laterality: N/A;  . SPINAL FIXATION SURGERY  02/26/2020  . XI ROBOTIC ASSISTED THORASCOPY FOR BIOPSY AP WINDOW LYMPH NODES (Left)  12/18/2020   Family History:  Family History  Problem Relation Age of Onset  . Hypertension Mother   . Hyperlipidemia Mother   . Stroke Mother   . Cancer Mother   . Hypertension Father   . Hyperlipidemia Father   . Healthy Sister    Family Psychiatric  History: unknown Social History:  Social History   Substance and Sexual Activity  Alcohol Use Never     Social History   Substance and Sexual Activity  Drug Use Not Currently  . Types: Cocaine    Social History   Socioeconomic History  . Marital status: Divorced    Spouse name: Not on file  . Number of children: 1  . Years of education: Not on file  . Highest education level: Not on file  Occupational History  . Not on file  Tobacco Use  . Smoking status: Current Some Day Smoker    Packs/day: 0.25    Types: Cigarettes  . Smokeless tobacco: Never Used  . Tobacco comment: occasionally  Vaping  Use  . Vaping Use: Never used  Substance and Sexual Activity  . Alcohol use: Never  . Drug use: Not Currently    Types: Cocaine  . Sexual activity: Not Currently  Other Topics Concern  . Not on file  Social History Narrative  . Not on file   Social Determinants of Health   Financial Resource Strain: Low Risk   . Difficulty of Paying Living Expenses: Not hard at all  Food Insecurity: No Food Insecurity  . Worried About Charity fundraiser in the Last Year: Never true  . Ran Out of Food in the Last Year: Never true  Transportation Needs: No Transportation Needs  . Lack of Transportation (Medical): No  . Lack of Transportation (Non-Medical): No  Physical Activity: Insufficiently Active  . Days of Exercise per Week: 7 days  . Minutes of Exercise per Session: 10 min  Stress:  No Stress Concern Present  . Feeling of Stress : Only a little  Social Connections: Not on file   Additional Social History:    Allergies:   Allergies  Allergen Reactions  . Ibuprofen     Unknown per MAR  . Penicillins Rash    Labs:  No results found for this or any previous visit (from the past 48 hour(s)).  Medications:  Current Facility-Administered Medications  Medication Dose Route Frequency Provider Last Rate Last Admin  . atorvastatin (LIPITOR) tablet 10 mg  10 mg Oral Daily Little, Wenda Overland, MD   10 mg at 03/03/21 0945  . brimonidine (ALPHAGAN) 0.2 % ophthalmic solution 1 drop  1 drop Both Eyes BID Little, Wenda Overland, MD   1 drop at 03/03/21 0948  . dapagliflozin propanediol (FARXIGA) tablet 10 mg  10 mg Oral Daily Little, Wenda Overland, MD   10 mg at 03/03/21 0947  . digoxin (LANOXIN) tablet 125 mcg  125 mcg Oral Daily Little, Wenda Overland, MD   125 mcg at 03/03/21 0945  . furosemide (LASIX) tablet 40 mg  40 mg Oral Daily Little, Wenda Overland, MD   40 mg at 03/03/21 0945  . latanoprost (XALATAN) 0.005 % ophthalmic solution 1 drop  1 drop Both Eyes QHS Little, Wenda Overland, MD   1  drop at 03/02/21 2135  . levothyroxine (SYNTHROID) tablet 100 mcg  100 mcg Oral QAC breakfast Little, Wenda Overland, MD   100 mcg at 03/03/21 0755  . melatonin tablet 3 mg  3 mg Oral QHS Little, Wenda Overland, MD   3 mg at 03/02/21 2134  . metoprolol succinate (TOPROL-XL) 24 hr tablet 100 mg  100 mg Oral Daily Little, Wenda Overland, MD   100 mg at 03/03/21 0944  . polyethylene glycol (MIRALAX / GLYCOLAX) packet 17 g  17 g Oral BID Little, Wenda Overland, MD      . risperiDONE (RISPERDAL) tablet 0.5 mg  0.5 mg Oral Q breakfast Thomes Lolling H, NP   0.5 mg at 03/03/21 0755  . risperiDONE (RISPERDAL) tablet 0.5 mg  0.5 mg Oral Q8H PRN Revonda Humphrey, NP      . risperiDONE (RISPERDAL) tablet 1 mg  1 mg Oral QHS Revonda Humphrey, NP   1 mg at 03/02/21 2133  . sacubitril-valsartan (ENTRESTO) 24-26 mg per tablet  1 tablet Oral BID Little, Wenda Overland, MD   1 tablet at 03/03/21 0947  . spironolactone (ALDACTONE) tablet 12.5 mg  12.5 mg Oral Daily Little, Wenda Overland, MD   12.5 mg at 03/03/21 0947  . tamsulosin (FLOMAX) capsule 0.4 mg  0.4 mg Oral Daily Little, Wenda Overland, MD   0.4 mg at 03/03/21 0945   Current Outpatient Medications  Medication Sig Dispense Refill  . acetaminophen (TYLENOL) 325 MG tablet Take 650 mg by mouth every 6 (six) hours as needed for moderate pain.    Marland Kitchen atorvastatin (LIPITOR) 10 MG tablet Take 10 mg by mouth daily.    . brimonidine (ALPHAGAN) 0.2 % ophthalmic solution Place 1 drop into both eyes 2 (two) times daily.     . dapagliflozin propanediol (FARXIGA) 10 MG TABS tablet Take 1 tablet (10 mg total) by mouth daily. 30 tablet 3  . digoxin (LANOXIN) 0.125 MG tablet Take 1 tablet (125 mcg total) by mouth daily. 30 tablet 11  . Ensure Plus (ENSURE PLUS) LIQD Take 237 mLs by mouth 2 (two) times daily between meals. chocolate    . furosemide (LASIX) 40 MG  tablet Take 1 tablet (40 mg total) by mouth daily. 30 tablet 1  . latanoprost (XALATAN) 0.005 % ophthalmic  solution Place 1 drop into both eyes at bedtime.     Marland Kitchen levothyroxine (SYNTHROID) 100 MCG tablet Take 100 mcg by mouth daily before breakfast.    . melatonin 3 MG TABS tablet Take 3 mg by mouth at bedtime.    . metoprolol tartrate (LOPRESSOR) 25 MG tablet Take 75 mg by mouth daily.    . pantoprazole (PROTONIX) 40 MG tablet Take 40 mg by mouth daily.    . risperiDONE (RISPERDAL) 0.5 MG tablet Take 1 tablet (0.5 mg total) by mouth at bedtime. 30 tablet 0  . sacubitril-valsartan (ENTRESTO) 24-26 MG Take 1 tablet by mouth 2 (two) times daily.    Marland Kitchen senna (SENOKOT) 8.6 MG TABS tablet Take 2 tablets (17.2 mg total) by mouth 2 (two) times daily. 120 tablet 0  . spironolactone (ALDACTONE) 25 MG tablet Take 0.5 tablets (12.5 mg total) by mouth daily. 30 tablet 0  . tamsulosin (FLOMAX) 0.4 MG CAPS capsule Take 0.4 mg by mouth daily.    . blood glucose meter kit and supplies KIT Dispense based on patient and insurance preference. Use up to four times daily as directed. (FOR ICD-9 250.00, 250.01). 1 each 0  . Insulin Pen Needle (PEN NEEDLES 29GX1/2") 29G X 12MM MISC 1 each by Does not apply route daily. 100 each 0  . metoprolol succinate (TOPROL XL) 100 MG 24 hr tablet Take 1 tablet (100 mg total) by mouth daily. Take with or immediately following a meal. (Patient not taking: Reported on 03/01/2021) 30 tablet 11  . polyethylene glycol (MIRALAX / GLYCOLAX) 17 g packet Take 17 g by mouth 2 (two) times daily. (Patient not taking: Reported on 03/01/2021) 14 each 0    Musculoskeletal: Strength & Muscle Tone: decreased Gait & Station: unsteady Patient leans: N/A  Psychiatric Specialty Exam: Physical Exam Vitals and nursing note reviewed. Chaperone present: Actuary.  Constitutional:      Appearance: Normal appearance. He is obese.  Cardiovascular:     Rate and Rhythm: Normal rate.  Pulmonary:     Effort: Pulmonary effort is normal.  Musculoskeletal:     Cervical back: Normal range of motion.  Neurological:      Mental Status: He is alert and oriented to person, place, and time.  Psychiatric:        Attention and Perception: Attention normal. He perceives auditory (Unable to tell what voices were saying "Just a lot of noise") hallucinations. He does not perceive visual hallucinations.        Mood and Affect: Affect normal. Mood is depressed (Stable).        Speech: Speech normal.        Behavior: Behavior normal. Behavior is cooperative.        Thought Content: Thought content is not paranoid or delusional. Thought content does not include homicidal or suicidal (Denies at this time) ideation.        Cognition and Memory: Cognition and memory normal.        Judgment: Judgment normal.     Review of Systems  Constitutional: Negative.   HENT: Negative.   Eyes: Negative.   Respiratory: Negative.   Cardiovascular: Negative.   Gastrointestinal: Negative.   Genitourinary: Negative.   Musculoskeletal: Positive for arthralgias.  Skin: Negative.   Neurological: Negative.   Hematological: Negative.   Psychiatric/Behavioral: Positive for dysphoric mood (Stable). Negative for agitation, behavioral problems, confusion and  decreased concentration. Hallucinations: States continues to hear noises; better but not gone away. Self-injury: Denies. Suicidal ideas: Denies at this time.   The patient is not hyperactive. Nervous/anxious: Stable.        Patient states he doesn't want to kill himself.  States feeling a little better but could "holler and cry"     Blood pressure 92/66, pulse 99, temperature 99 F (37.2 C), temperature source Oral, resp. rate (!) 22, height 5' 9"  (1.753 m), weight 112.1 kg, SpO2 95 %.Body mass index is 36.5 kg/m.  General Appearance: Disheveled  Eye Contact:  Poor  Speech:  Clear and Coherent and Pressured  Volume:  Increased  Mood:  Anxious, Depressed and Hopeless  Affect:  Congruent  Thought Process:  Coherent  Orientation:  Oriented x 4  Thought Content:  Hallucinations:  Auditory  Suicidal Thoughts:  Yes.  with intent/plan  Homicidal Thoughts:  No  Memory:  Immediate;   Good Recent;   Good Remote;   Good  Judgement:  Poor  Insight:  Fair  Psychomotor Activity:  Normal  Concentration:  Concentration: Fair and Attention Span: Fair  Recall:  Good  Fund of Knowledge:  Good  Language:  Good  Akathisia:  No  Handed:  Right  AIMS (if indicated):     Assets:  Communication Skills Desire for Improvement Financial Resources/Insurance Housing Resilience Social Support  ADL's:  Intact  Cognition:  WNL  Sleep:   poor    Treatment Plan Summary:  Daily contact with patient to assess and evaluate symptoms and progress in treatment and Medication management    Continue current medication:   Risperdal to 0.5 mg PO QAM,   1 mg PO QHS,   0.90m PO Q8H PRN severe agitation.    Disposition:  Psychiatrically cleared No evidence of imminent risk to self or others at present.   Patient does not meet criteria for psychiatric inpatient admission. Supportive therapy provided about ongoing stressors. Discussed crisis plan, support from social network, calling 911, coming to the Emergency Department, and calling Suicide Hotline.   This service was provided via telemedicine using a 2-way, interactive audio and video technology.  Names of all persons participating in this telemedicine service and their role in this encounter. Name: Charles RADNEYRole: patient  Name: SEarleen NewportRole: NP  Name: Dr. AHampton AbbotRole: Psychiatrist  Name:Dr. GRegenia SkeeterRole: MJanesvillesent a secure message informing: Patient seen and psychiatrically cleared for discharge.  Patient will need resources for outpatient psychiatric services (medication management and therapy) will order social work consult.  Patient will need prescription for Risperdal at least 30 days until can be seen by outpatient psychiatric provider.      Maxi Rodas, NP 03/03/2021 11:56 AM

## 2021-03-03 NOTE — ED Notes (Signed)
Pt given decaf. Coffee.

## 2021-03-03 NOTE — Discharge Instructions (Addendum)
It was our pleasure to provide your ER care today - we hope that you feel better.  Continue your medication. Drink plenty of fluids/stay well hydrated.   Follow up with primary care doctor, and with behavioral health provider, in the next 1-2 weeks.   For mental health issues and/or crisis you may go directly to the [facility] - it is open 24/7, and walk-ins are welcome.   Return to ER if worse, new symptoms, fevers, trouble breathing, or other emergency concern.

## 2021-03-03 NOTE — ED Notes (Signed)
Pt having TTS consult 

## 2021-03-03 NOTE — ED Notes (Signed)
Pt remains out of the department at this time.

## 2021-03-03 NOTE — ED Notes (Signed)
ED Provider at bedside. 

## 2021-03-03 NOTE — ED Notes (Signed)
Pt awake up in bed eating breakfast. 

## 2021-03-03 NOTE — ED Provider Notes (Addendum)
Emergency Medicine Observation Re-evaluation Note  Charles [name] is a 55 y.o. male, seen on rounds today.  Pt initially presented to the ED for complaints of stress, depressed mood.  Currently feeling improved.   Physical Exam  BP 92/66   Pulse 99   Temp 99 F (37.2 C) (Oral)   Resp (!) 22   Ht 1.753 m (5\' 9" )   Wt 112.1 kg   SpO2 95%   BMI 36.50 kg/m  Physical Exam General: alert, content, nad.  Cardiac: regular rate.  Lungs: breathing comfortably. Psych: alert, content. Pt with normal mood/affect. Pt voices no thoughts and/or intent to harm self or others. No SI.  ED Course / MDM   I have reviewed the labs performed to date as well as medications administered while in observation.  Recent changes in the last 24 hours include stabilization and [place] team reassessment.   Plan  Current plan is that [facility] team has reassessed patient and indicates patient is psych clear for d/c:  Psychiatrically cleared No evidence of imminent risk to self or others at present.   Patient does not meet criteria for psychiatric inpatient admission. Supportive therapy provided about ongoing stressors. Discussed crisis plan, support from social network, calling 911, coming to the Emergency Department, and calling Suicide Hotline.   This service was provided via telemedicine using a 2-way, interactive audio and video technology.  Names of all persons participating in this telemedicine service and their role in this encounter. Name: Charles [name] Role: patient  Name: Earleen Newport Role: NP  Name: Dr. Hampton Abbot Role: Psychiatrist  Name:Dr. Regenia Skeeter Role: [place] sent a secure    On discussion with patient from bh standpoint, he indicates is feeling improved. He has normal mood and affect, no SI, no thoughts of harm to others. He indicates his main worry/concern in coming to ED relates housing, whereby he indicates he doesn't like the current placed he has lived for the past couple months [facility]),  states was at another place during the few months prior to that, but did not like that facility either.  TOC consulted - best option for pt is to return to his current home - will provide additional resource guides.   Pt has eaten lunch/normal appetite. No new c/o.   Will provide patient [facility]. Encourage to f/u at [facility] if any future behavioral health crisis, as well as to f/u with outpatient [facility] counselling.   Return precautions provided.        Lajean Saver, MD 03/03/21 1340

## 2021-03-03 NOTE — Social Work (Signed)
CSW met with Pt at bedside. Per Pt, he signed himself out of [place] of his on accord.  Pt states he has some money and will go to a [employer].  He also reports that he will purchase bus tickets to get to his radiation therapy appointments for the remainder of appointments.  Pt is familiar with [facility] and [facility] and says he will utilize their services for further needs.  Pt also states he aware of [facility] and will call to make a follow up appointment for therapy.

## 2021-03-04 ENCOUNTER — Encounter (HOSPITAL_COMMUNITY): Payer: Self-pay | Admitting: Student

## 2021-03-04 ENCOUNTER — Ambulatory Visit
Admission: RE | Admit: 2021-03-04 | Discharge: 2021-03-04 | Disposition: A | Discharge status: Home / Self Care | Payer: Medicaid Other | Source: Ambulatory Visit | Attending: Radiation Oncology | Admitting: Radiation Oncology

## 2021-03-04 DIAGNOSIS — I110 Hypertensive heart disease with heart failure: Secondary | ICD-10-CM | POA: Diagnosis not present

## 2021-03-04 DIAGNOSIS — Z794 Long term (current) use of insulin: Secondary | ICD-10-CM | POA: Diagnosis not present

## 2021-03-04 DIAGNOSIS — R000 Tachycardia, unspecified: Secondary | ICD-10-CM | POA: Diagnosis not present

## 2021-03-04 DIAGNOSIS — F17210 Nicotine dependence, cigarettes, uncomplicated: Secondary | ICD-10-CM | POA: Diagnosis not present

## 2021-03-04 DIAGNOSIS — R45851 Suicidal ideations: Secondary | ICD-10-CM | POA: Diagnosis not present

## 2021-03-04 DIAGNOSIS — E119 Type 2 diabetes mellitus without complications: Secondary | ICD-10-CM | POA: Diagnosis not present

## 2021-03-04 DIAGNOSIS — Z8616 Personal history of COVID-19: Secondary | ICD-10-CM | POA: Diagnosis not present

## 2021-03-04 DIAGNOSIS — Z510 Encounter for antineoplastic radiation therapy: Secondary | ICD-10-CM | POA: Diagnosis not present

## 2021-03-04 DIAGNOSIS — Z20822 Contact with and (suspected) exposure to covid-19: Secondary | ICD-10-CM | POA: Diagnosis not present

## 2021-03-04 DIAGNOSIS — I509 Heart failure, unspecified: Secondary | ICD-10-CM | POA: Diagnosis not present

## 2021-03-04 DIAGNOSIS — F333 Major depressive disorder, recurrent, severe with psychotic symptoms: Secondary | ICD-10-CM | POA: Diagnosis present

## 2021-03-04 DIAGNOSIS — Z79899 Other long term (current) drug therapy: Secondary | ICD-10-CM | POA: Diagnosis not present

## 2021-03-04 LAB — RESP PANEL BY RT-PCR (FLU A&B, COVID) ARPGX2
Influenza A by PCR: NEGATIVE
Influenza B by PCR: NEGATIVE
SARS Coronavirus 2 by RT PCR: NEGATIVE

## 2021-03-04 MED ORDER — DAPAGLIFLOZIN PROPANEDIOL 10 MG PO TABS
10.0000 mg | ORAL_TABLET | Freq: Every day | ORAL | Status: DC
  Administered 2021-03-05 – 2021-03-13 (×9): 10 mg via ORAL
  Filled 2021-03-04 (×14): qty 1

## 2021-03-04 MED ORDER — TAMSULOSIN HCL 0.4 MG PO CAPS
0.4000 mg | ORAL_CAPSULE | Freq: Every day | ORAL | Status: DC
  Administered 2021-03-04 – 2021-03-13 (×10): 0.4 mg via ORAL
  Filled 2021-03-04 (×10): qty 1

## 2021-03-04 MED ORDER — LEVOTHYROXINE SODIUM 100 MCG PO TABS
100.0000 ug | ORAL_TABLET | Freq: Every day | ORAL | Status: DC
  Administered 2021-03-05 – 2021-03-14 (×10): 100 ug via ORAL
  Filled 2021-03-04 (×11): qty 1

## 2021-03-04 MED ORDER — NICOTINE 21 MG/24HR TD PT24
21.0000 mg | MEDICATED_PATCH | Freq: Every day | TRANSDERMAL | Status: DC
  Filled 2021-03-04 (×6): qty 1

## 2021-03-04 MED ORDER — MELATONIN 3 MG PO TABS: 3.0000 mg | ORAL_TABLET | Freq: Every day | ORAL | Status: DC

## 2021-03-04 MED ORDER — ATORVASTATIN CALCIUM 10 MG PO TABS
10.0000 mg | ORAL_TABLET | Freq: Every day | ORAL | Status: DC
  Administered 2021-03-04 – 2021-03-13 (×10): 10 mg via ORAL
  Filled 2021-03-04 (×10): qty 1

## 2021-03-04 MED ORDER — ONDANSETRON HCL 4 MG PO TABS
4.0000 mg | ORAL_TABLET | Freq: Three times a day (TID) | ORAL | Status: DC | PRN
  Filled 2021-03-04: qty 1

## 2021-03-04 MED ORDER — PANTOPRAZOLE SODIUM 40 MG PO TBEC
40.0000 mg | DELAYED_RELEASE_TABLET | Freq: Every day | ORAL | Status: DC
  Administered 2021-03-04 – 2021-03-13 (×10): 40 mg via ORAL
  Filled 2021-03-04 (×10): qty 1

## 2021-03-04 MED ORDER — SACUBITRIL-VALSARTAN 24-26 MG PO TABS
1.0000 | ORAL_TABLET | Freq: Two times a day (BID) | ORAL | Status: DC
  Administered 2021-03-04 – 2021-03-07 (×6): 1 via ORAL
  Filled 2021-03-04 (×7): qty 1

## 2021-03-04 MED ORDER — METOPROLOL TARTRATE 25 MG PO TABS
75.0000 mg | ORAL_TABLET | Freq: Every day | ORAL | Status: DC
  Administered 2021-03-04: 75 mg via ORAL
  Filled 2021-03-04: qty 3

## 2021-03-04 MED ORDER — FUROSEMIDE 20 MG PO TABS
40.0000 mg | ORAL_TABLET | Freq: Every day | ORAL | Status: DC
  Administered 2021-03-04 – 2021-03-13 (×10): 40 mg via ORAL
  Filled 2021-03-04 (×10): qty 2

## 2021-03-04 MED ORDER — RISPERIDONE 0.5 MG PO TABS: 0.5000 mg | ORAL_TABLET | Freq: Every day | ORAL | Status: DC

## 2021-03-04 MED ORDER — BRIMONIDINE TARTRATE 0.2 % OP SOLN
1.0000 [drp] | Freq: Two times a day (BID) | OPHTHALMIC | Status: DC
  Administered 2021-03-04 – 2021-03-13 (×11): 1 [drp] via OPHTHALMIC
  Filled 2021-03-04 (×2): qty 5

## 2021-03-04 MED ORDER — ALUM & MAG HYDROXIDE-SIMETH 200-200-20 MG/5ML PO SUSP: 30.0000 mL | Freq: Four times a day (QID) | ORAL | Status: DC | PRN

## 2021-03-04 MED ORDER — METOPROLOL SUCCINATE ER 25 MG PO TB24
75.0000 mg | ORAL_TABLET | Freq: Every day | ORAL | Status: DC
  Administered 2021-03-05 – 2021-03-07 (×3): 75 mg via ORAL
  Filled 2021-03-04 (×4): qty 3

## 2021-03-04 MED ORDER — LEVOTHYROXINE SODIUM 100 MCG PO TABS: 100.0000 ug | ORAL_TABLET | Freq: Every day | ORAL | Status: DC

## 2021-03-04 MED ORDER — ACETAMINOPHEN 325 MG PO TABS: 650.0000 mg | ORAL_TABLET | ORAL | Status: DC | PRN

## 2021-03-04 MED ORDER — SENNA 8.6 MG PO TABS
2.0000 | ORAL_TABLET | Freq: Two times a day (BID) | ORAL | Status: DC
  Administered 2021-03-04 – 2021-03-11 (×11): 17.2 mg via ORAL
  Filled 2021-03-04 (×15): qty 2

## 2021-03-04 MED ORDER — DIGOXIN 125 MCG PO TABS
125.0000 ug | ORAL_TABLET | Freq: Every day | ORAL | Status: DC
  Administered 2021-03-04 – 2021-03-13 (×9): 125 ug via ORAL
  Filled 2021-03-04 (×10): qty 1

## 2021-03-04 MED ORDER — SPIRONOLACTONE 12.5 MG HALF TABLET
12.5000 mg | ORAL_TABLET | Freq: Every day | ORAL | Status: DC
  Administered 2021-03-04 – 2021-03-12 (×9): 12.5 mg via ORAL
  Filled 2021-03-04 (×14): qty 1

## 2021-03-04 MED ORDER — ENSURE PLUS PO LIQD: 237.0000 mL | Freq: Two times a day (BID) | ORAL | Status: DC

## 2021-03-04 MED ORDER — LATANOPROST 0.005 % OP SOLN: 1.0000 [drp] | Freq: Every day | OPHTHALMIC | Status: DC

## 2021-03-04 NOTE — ED Notes (Signed)
Patient to go to [facility] for 7:45 am radiation treatment via [facility].

## 2021-03-04 NOTE — ED Provider Notes (Signed)
55 year old man history of bipolar disorder, type 2 diabetes, CHF, lymphoma, currently undergoing radiation therapy who has been seen in the ED and at [facility] for depression and suicidal ideation.  Patient presented on 422 to the behavioral health urgent care.  He was subsequently transferred to the [facility] ED due to having a walker.  While in the ED he was seen and evaluated by behavioral health and eventually cleared for discharge on April 26.  He was discharged yesterday to a shelter.  He reports that the shelter told him they did not have a bed for him for 24 hours.  He attempted to go to a hotel that he knew of which has since been closed.  At which point, he reports that he began feeling suicidal again and return to the emergency department.  He returned to the emergency department at [facility] on April 26.  At that time he stated he was suicidal and having auditory hallucinations.  At that time he was seen evaluated by an ED provider and continue to have thought content inclusive of suicidal ideation.  Patient medically cleared and awaiting TTS since that evaluation. In the interim he has gone for his radiation oncology treatment today.  He tells me he is going to radiation oncology daily until next Thursday. Plan consult to psychiatry and to medicine. Discussed with Dr. Rockne Menghini and appreciate her review and any assistance   Pattricia Boss, MD 03/05/21 [id number]

## 2021-03-04 NOTE — ED Notes (Signed)
[facility] called  

## 2021-03-04 NOTE — ED Notes (Signed)
[facility] has arrived to bring pt to [facility] center for his chemotherapy treatment, his personal walker has been left here since pt is expected to return afterwards since he is not psych cleared yet.

## 2021-03-04 NOTE — ED Notes (Signed)
Pts belongings placed in locker #11 in purple zone

## 2021-03-04 NOTE — BH Assessment (Signed)
Comprehensive Clinical Assessment (CCA) Note  03/04/2021 Charles [name] [id number]  Disposition: Per Earleen Newport, NP, patient is psychiatrically cleared, however he needs CSW to assist with housing needs. Kari Baars, RN notified through secure chat.    [place] [facility] from 03/03/2021 in [place] [facility] from 02/27/2021 in [place] [facility] from 02/03/2021 in [place] CATEGORY Moderate Risk High Risk No Risk       The patient demonstrates the following risk factors for suicide: Chronic risk factors for suicide include: psychiatric disorder of MDD, substance use disorder and previous suicide attempts multiple . Acute risk factors for suicide include: unemployment and loss (financial, interpersonal, professional). Protective factors for this patient include: positive therapeutic relationship. Considering these factors, the overall suicide risk at this point appears to be moderate. [name] appropriate for outpatient follow up.   Charles [name] is a 55 year old male presenting to [facility] with SI after being psychiatrically cleared on 03/03/21 by NP Shuvon Rankin. Per report the plan was for CSW to assist patient with locating an assisted living facility. Patient reports he was discharged yesterday to a shelter and once he got to the shelter, he was told that it would be 24 hours before he could possibly get a bed. Patient states that he rode the bus to a hotel that he was familiar with a while ago but when he arrived at the hotel it was closed. Patient reports increased anxiety, stress and frustration and started to have SI. Patient states that he does not want to die or kill himself but due to current stressors it has been a recent thought. Patient denies wanting to kill himself during the assessment. Patient also reports that the [facility] has decreased since yesterday. Patient states that he can  not return to [facility]. Patient denies disruptive aggressive behaviors at [facility], however reports signing a document and now he cannot return there. Patient denies HI and VH.      Chief Complaint:  Chief Complaint  Patient presents with  . Psychiatric Evaluation   Visit Diagnosis: MDD (major depressive disorder), recurrent, severe, with psychosis ([place])    CCA Screening, Triage and Referral (STR)  Patient Reported Information How did you hear about [place]? Self  Referral name: Self  Referral phone number: 0 (N/A)   Whom do you see for routine medical problems? Primary Care  Practice/Facility Name: Not assessed  Practice/Facility Phone Number: [phone] (Not assessed)  Name of Contact: Not assessed  Contact Number: Not assessed  Contact Fax Number: Not assessed  Prescriber Name: Not assessed  Prescriber Address (if known): Not assessed   What Is the Reason for Your Visit/Call Today? Pt states he's experiencing depression, is "starting to" experience SI, and has AH.  How Long Has This Been Causing You Problems? 1 wk - 1 month  What Do You Feel Would Help You the Most Today? Treatment for Depression or other mood problem   Have You Recently Been in Any Inpatient Treatment (Hospital/Detox/Crisis Center/28-Day Program)? No  Name/Location of Program/Hospital:No data recorded How Long Were You There? No data recorded When Were You Discharged? No data recorded  Have You Ever Received Services From [facility] Before? No  Who Do You See at [facility]? Patient has been seen in the ED and at the [facility]   Have You Recently Had Any Thoughts About [place]? Yes  Are You Planning to Commit Suicide/Harm Yourself  At This time? No   Have you Recently Had Thoughts About [place]? Yes  Explanation: No data recorded  Have You Used Any Alcohol or Drugs in the Past 24 Hours? No  How Long Ago Did You Use Drugs or Alcohol? No data recorded What Did  You Use and How Much? No data recorded  Do You Currently Have a Therapist/Psychiatrist? No  Name of Therapist/Psychiatrist: No data recorded  Have You Been Recently Discharged From Any Office Practice or Programs? No  Explanation of Discharge From Practice/Program: No data recorded    CCA Screening Triage Referral Assessment Type of Contact: Face-to-Face  Is this Initial or Reassessment? Initial Assessment  Date Telepsych consult ordered in [facility]:  02/03/2021  Time Telepsych consult ordered in [facility]:  0706   Patient Reported Information Reviewed? Yes  Patient Left Without Being Seen? No data recorded Reason for Not Completing Assessment: No data recorded  Collateral Involvement: Pt declined to provide verbal consent for clinician to make contact with friends/family for collateral information.   Does Patient Have a Stage manager Guardian? No data recorded Name and Contact of Legal Guardian: No data recorded If Minor and Not Living with Parent(s), Who has Custody? N/A  Is CPS involved or ever been involved? Never  Is APS involved or ever been involved? Never   Patient Determined To Be At Risk for Harm To Self or Others Based on Review of Patient Reported Information or Presenting Complaint? Yes, for Harm to Others  Method: No Plan  Availability of Means: No access or NA  Intent: Vague intent or NA  Notification Required: No need or identified person  Additional Information for Danger to Others Potential: No data recorded Additional Comments for Danger to Others Potential: Pt states he's been having thoughts of hurting "anybody, it don't matter."  Are There Guns or Other Weapons in Your Home? No  Types of Guns/Weapons: No data recorded Are These Weapons Safely Secured?                            No data recorded Who Could Verify You Are Able To Have These Secured: No data recorded Do You Have any Outstanding Charges, Pending Court Dates, Parole/Probation? Pt  denies  Contacted To Inform of Risk of Harm To Self or Others: Other: Comment (Pt's assisted living home)   Location of Assessment: [facility]   Does Patient Present under Involuntary Commitment? No  IVC Papers Initial File Date: No data recorded  [place] of Residence: [place]   Patient Currently Receiving the Following Services: Not Receiving Services   Determination of Need: Urgent (48 hours)   Options For Referral: Other: Comment (Overnight observation in the ED)     CCA Biopsychosocial Intake/Chief Complaint:  Pt states he's experiencing depression, is "starting to" experience SI, and has AH.  Current Symptoms/Problems: Depression, AH, and some SI.   Patient Reported Schizophrenia/Schizoaffective Diagnosis in Past: Yes   Strengths: Not assessed  Preferences: Not assessed  Abilities: Not assessed   Type of Services Patient Feels are Needed: Not assessed   Initial Clinical Notes/Concerns: N/A   Mental Health Symptoms Depression:  Sleep (too much or little); Change in energy/activity; Increase/decrease in appetite   Duration of Depressive symptoms: Greater than two weeks   Mania:  None   Anxiety:   Fatigue; Restlessness; Sleep; Tension; Worrying   Psychosis:  Hallucinations   Duration of Psychotic symptoms: Greater than six months  Trauma:  None   Obsessions:  None   Compulsions:  None   Inattention:  None   Hyperactivity/Impulsivity:  N/A   Oppositional/Defiant Behaviors:  None   Emotional Irregularity:  None   Other Mood/Personality Symptoms:  None noted    Mental Status Exam Appearance and self-care  Stature:  Average   Weight:  Average weight   Clothing:  Age-appropriate   Grooming:  Normal   Cosmetic use:  None   Posture/gait:  Normal; Other (Comment) (Patient requires a walker)   Motor activity:  Not Remarkable   Sensorium  Attention:  Normal   Concentration:  Normal   Orientation:  X5    Recall/memory:  Normal   Affect and Mood  Affect:  Anxious; Depressed   Mood:  Anxious; Depressed; Worthless; Hopeless   Relating  Eye contact:  Normal   Facial expression:  Depressed   Attitude toward examiner:  Cooperative   Thought and Language  Speech flow: Normal   Thought content:  Appropriate to Mood and Circumstances   Preoccupation:  None   Hallucinations:  Auditory   Organization:  No data recorded  [employer] of Knowledge:  Average   Intelligence:  Average   Abstraction:  Functional   Judgement:  Impaired   Reality Testing:  Realistic   Insight:  Fair   Decision Making:  Impulsive   Social Functioning  Social Maturity:  Isolates   Social Judgement:  Normal   Stress  Stressors:  Illness; Financial; Housing   Coping Ability:  Exhausted; Overwhelmed   Skill Deficits:  Self-control   Supports:  Support needed     Religion: Religion/Spirituality Are You A Religious Person?: Yes (not assessed) What is Your Religious Affiliation?: Catholic How Might This Affect Treatment?: Not assessed  Leisure/Recreation: Leisure / Recreation Do You Have Hobbies?: Yes Leisure and Hobbies: T.V  Exercise/Diet: Exercise/Diet Do You Exercise?: No Have You Gained or Lost A Significant Amount of Weight in the Past Six Months?: No Do You Follow a Special Diet?: Yes Type of Diet: Special diet due to heart condition Do You Have Any Trouble Sleeping?: Yes Explanation of Sleeping Difficulties: States that he has only been sleepign 4-5 hours/night   CCA Employment/Education Employment/Work Situation:    Education:     CCA Family/Childhood History Family and Relationship History: Family history Marital status: Divorced Divorced, when?: Not assessed What types of issues is patient dealing with in the relationship?: Not assessed Additional relationship information: Not assessed Are you sexually active?:  (Not assessed) What is your  sexual orientation?: Not assessed Has your sexual activity been affected by drugs, alcohol, medication, or emotional stress?: Not assessed Does patient have children?: Yes How many children?:  (Pt states he has 1-2 children; pt did not elaborate on this) How is patient's relationship with their children?: Not assessed  Childhood History:  Childhood History By whom was/is the patient raised?: Grandparents Additional childhood history information: Patient states that his mother had mental health issues Description of patient's relationship with caregiver when they were a child: Patient states that he was raised by his grandparents in what he describes as a good home Patient's description of current relationship with people who raised him/her: Not assessed How were you disciplined when you got in trouble as a child/adolescent?: Not assessed Does patient have siblings?:  (Not assessed) Did patient suffer any verbal/emotional/physical/sexual abuse as a child?: No Did patient suffer from severe childhood neglect?: No Has patient ever been sexually abused/assaulted/raped as an adolescent  or adult?: No Was the patient ever a victim of a crime or a disaster?: No Witnessed domestic violence?: No Has patient been affected by domestic violence as an adult?: No  Child/Adolescent Assessment:     CCA Substance Use Alcohol/Drug Use: Alcohol / Drug Use Pain Medications: see MAR Prescriptions: see MAR Over the Counter: see MAR History of alcohol / drug use?: No history of alcohol / drug abuse Longest period of sobriety (when/how long): N/A Negative Consequences of Use:  (N/A) Withdrawal Symptoms:  (N/A)                         ASAM's:  Six Dimensions of Multidimensional Assessment  Dimension 1:  Acute Intoxication and/or Withdrawal Potential:      Dimension 2:  Biomedical Conditions and Complications:      Dimension 3:  Emotional, Behavioral, or Cognitive Conditions and  Complications:     Dimension 4:  Readiness to Change:     Dimension 5:  Relapse, Continued use, or Continued Problem Potential:     Dimension 6:  Recovery/Living Environment:     ASAM Severity Score:    ASAM Recommended Level of Treatment: ASAM Recommended Level of Treatment:  (N/A)   Substance use Disorder (SUD) Substance Use Disorder (SUD)  Checklist Symptoms of Substance Use:  (N/A)  Recommendations for Services/Supports/Treatments: Recommendations for Services/Supports/Treatments Recommendations For Services/Supports/Treatments: Medication Management,Individual Therapy,Other (Comment) (Overnight observation in the ED)  DSM5 Diagnoses: Patient Active Problem List   Diagnosis Date Noted  . MDD (major depressive disorder), recurrent, severe, with psychosis ([place]) 02/28/2021  . Acute hypoxemic respiratory failure ([place]) 01/13/2021  . Acute on chronic heart failure with reduced ejection fraction and diastolic dysfunction ([place]) 01/12/2021  . Schizophrenia ([place]) 01/04/2021  . Diarrhea 01/04/2021  . Pulmonary edema   . Mediastinal adenopathy 12/18/2020  . Adjustment disorder with mixed anxiety and depressed mood 10/30/2020  . Mediastinal lymphadenopathy 10/11/2020  . Shortness of breath 10/11/2020  . Sinus tachycardia 10/11/2020  . Night sweats 10/11/2020  . DM2 (diabetes mellitus, type 2) ([place]) 10/11/2020  . 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 Hypertension 09/25/2020  . BPH (benign prostatic hyperplasia) 09/25/2020  . Insomnia 09/25/2020    Disposition: Per Shuvon Rankin, NP, patient is psychiatrically cleared, however he needs CSW to assist with housing needs. Kari Baars, RN notified through secure chat.    [place], [facility]

## 2021-03-04 NOTE — ED Notes (Signed)
Patient clears psychiatrically, SW to help find housing for patient.

## 2021-03-04 NOTE — Progress Notes (Signed)
CSW met with Pt at bedside. Per Pt he receives $830/monthly in SSDI.  Pt states that he is willing to give up payment to a nursing facility to cover long-term care.  TOC team will initiate process.  [facility] director Nathaniel Man aware of case.

## 2021-03-04 NOTE — ED Notes (Signed)
First encounter with patient, patient resting on side of cart on telephone. Patient requesting diet cola at this time. Updated and agreeable to plan. No other needs.

## 2021-03-04 NOTE — ED Notes (Signed)
Called [facility] for transport to [facility] for radiation treatment but [facility] already has pt on the list for transport.

## 2021-03-04 NOTE — ED Provider Notes (Signed)
[facility] DEPARTMENT Provider Note   CSN: [id number] Arrival date & time: 03/03/21  1924     History Chief Complaint  Patient presents with  . Psychiatric Evaluation    Charles [name] is a 55 y.o. male with a hx of HTN, T2DM, bipolar 1 disorder, CHF, & nodular lymphocyte predominant hodgkin lymphoma, schizophrenia, & bipolar disorder who presents to the ED with complaints of SI. Patient states he was discharged from behavioral health earlier today, had trouble with housing, went to multiple locations with continued issue which led to frustration, stress, and anxiety. 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 No other alleviating/aggravating factors. Did not attempt to harm himself. Has had a cough which he reports is chronic without acute change. Denies fever, chest pain, dyspnea, hemoptysis, syncope, leg pain/swelling, vomiting, or abdominal pain. Denies HI.   HPI     Past Medical History:  Diagnosis Date  . Arthritis    lower back  . Bipolar 1 disorder ([place])   . Cardiomyopathy, nonischemic ([place])    no CAD 10/14/20 cath, EF 20-25% by 10/11/20 echo  . CHF (congestive heart failure) ([place])   . COVID 10/2020  . Depression   . DM2 (diabetes mellitus, type 2) ([place])   . HTN (hypertension)   . Myocardial infarction [facility])     Patient Active Problem List   Diagnosis Date Noted  . MDD (major depressive disorder), recurrent, severe, with psychosis ([place]) 02/28/2021  . Acute hypoxemic respiratory failure ([place]) 01/13/2021  . Acute on chronic heart failure with reduced ejection fraction and diastolic dysfunction ([place]) 01/12/2021  . Schizophrenia ([place]) 01/04/2021  . Diarrhea 01/04/2021  . Pulmonary edema   . Mediastinal adenopathy 12/18/2020  . Adjustment disorder with mixed anxiety and depressed mood 10/30/2020  .  Mediastinal lymphadenopathy 10/11/2020  . Shortness of breath 10/11/2020  . Sinus tachycardia 10/11/2020  . Night sweats 10/11/2020  . DM2 (diabetes mellitus, type 2) ([place]) 10/11/2020  . 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 Hypertension 09/25/2020  . BPH (benign prostatic hyperplasia) 09/25/2020  . Insomnia 09/25/2020    Past Surgical History:  Procedure Laterality Date  . BACK SURGERY  01/2020  . CARDIAC CATHETERIZATION  10/2020  . CHOLECYSTECTOMY    . HERNIA REPAIR    . INTERCOSTAL NERVE BLOCK Left 12/18/2020   Procedure: INTERCOSTAL NERVE BLOCK;  Surgeon: Melrose Nakayama, MD;  Location: [facility];  Service: Thoracic;  Laterality: Left;  . LYMPH NODE BIOPSY Left 12/18/2020   Procedure: LYMPH NODE BIOPSY;  Surgeon: Melrose Nakayama, MD;  Location: [facility];  Service: Thoracic;  Laterality: Left;  . RIGHT/LEFT HEART CATH AND CORONARY ANGIOGRAPHY N/A 10/14/2020   Procedure: RIGHT/LEFT HEART CATH AND CORONARY ANGIOGRAPHY;  Surgeon: Jettie Booze, MD;  Location: [facility];  Service: Cardiovascular;  Laterality: N/A;  . SPINAL FIXATION SURGERY  02/26/2020  . XI ROBOTIC ASSISTED THORASCOPY FOR BIOPSY AP WINDOW LYMPH NODES (Left)  12/18/2020       Family History  Problem Relation Age of Onset  . Hypertension Mother   . Hyperlipidemia Mother   . Stroke Mother   . Cancer Mother   . Hypertension Father   . Hyperlipidemia Father   . Healthy Sister     Social History   Tobacco Use  . Smoking  status: Current Some Day Smoker    Packs/day: 0.25    Types: Cigarettes  . Smokeless tobacco: Never Used  . Tobacco comment: occasionally  Vaping Use  . Vaping Use: Never used  Substance Use Topics  . Alcohol use: Never  . Drug use: Not Currently    Types: Cocaine    Home Medications Prior to Admission medications   Medication Sig Start Date End Date Taking? Authorizing Provider  acetaminophen (TYLENOL)  325 MG tablet Take 650 mg by mouth every 6 (six) hours as needed for moderate pain.    [provider]  atorvastatin (LIPITOR) 10 MG tablet Take 10 mg by mouth daily. 09/30/20   [provider]  blood glucose meter kit and supplies KIT Dispense based on patient and insurance preference. Use up to four times daily as directed. (FOR ICD-9 250.00, 250.01). 10/23/20   Barb Merino, MD  brimonidine (ALPHAGAN) 0.2 % ophthalmic solution Place 1 drop into both eyes 2 (two) times daily.     [provider]  dapagliflozin propanediol (FARXIGA) 10 MG TABS tablet Take 1 tablet (10 mg total) by mouth daily. 10/23/20   Barb Merino, MD  digoxin (LANOXIN) 0.125 MG tablet Take 1 tablet (125 mcg total) by mouth daily. 12/29/20 12/29/21  Darrick Grinder D, NP  Ensure Plus (ENSURE PLUS) LIQD Take 237 mLs by mouth 2 (two) times daily between meals. chocolate    [provider]  furosemide (LASIX) 40 MG tablet Take 1 tablet (40 mg total) by mouth daily. 01/16/21   [name], MD  Insulin Pen Needle (PEN NEEDLES 29GX1/2") 29G X 12MM MISC 1 each by Does not apply route daily. 10/23/20   Barb Merino, MD  latanoprost (XALATAN) 0.005 % ophthalmic solution Place 1 drop into both eyes at bedtime.     [provider]  levothyroxine (SYNTHROID) 100 MCG tablet Take 100 mcg by mouth daily before breakfast.    [provider]  melatonin 3 MG TABS tablet Take 3 mg by mouth at bedtime. 09/08/20   [provider]  metoprolol succinate (TOPROL XL) 100 MG 24 hr tablet Take 1 tablet (100 mg total) by mouth daily. Take with or immediately following a meal. Patient not taking: Reported on 03/01/2021 01/22/21 01/22/22  Maudie Mercury, MD  metoprolol tartrate (LOPRESSOR) 25 MG tablet Take 75 mg by mouth daily.    [provider]  pantoprazole (PROTONIX) 40 MG tablet Take 40 mg by mouth daily.    [provider]  polyethylene glycol (MIRALAX / GLYCOLAX) 17 g  packet Take 17 g by mouth 2 (two) times daily. Patient not taking: Reported on 03/01/2021 10/23/20   Barb Merino, MD  risperiDONE (RISPERDAL) 0.5 MG tablet Take 1 tablet (0.5 mg total) by mouth at bedtime. 12/14/20   Lucrezia Starch, MD  sacubitril-valsartan (ENTRESTO) 24-26 MG Take 1 tablet by mouth 2 (two) times daily.    [provider]  senna (SENOKOT) 8.6 MG TABS tablet Take 2 tablets (17.2 mg total) by mouth 2 (two) times daily. 10/23/20   Barb Merino, MD  spironolactone (ALDACTONE) 25 MG tablet Take 0.5 tablets (12.5 mg total) by mouth daily. 01/07/21   Madalyn Rob, MD  tamsulosin (FLOMAX) 0.4 MG CAPS capsule Take 0.4 mg by mouth daily. 09/08/20   [provider]    Allergies    Ibuprofen and Penicillins  Review of Systems   Review of Systems  Constitutional: Negative for chills and fever.  Respiratory: Positive for cough (chronic unchanged).  Negative for shortness of breath.   Cardiovascular: Negative for chest pain and leg swelling.  Gastrointestinal: Negative for abdominal pain and vomiting.  Neurological: Negative for syncope.  Psychiatric/Behavioral: Positive for hallucinations and suicidal ideas. The patient is nervous/anxious.   All other systems reviewed and are negative.   Physical Exam Updated Vital Signs BP 117/90   Pulse (!) 108   Temp 98.5 F (36.9 C)   Resp 16   SpO2 99%   Physical Exam Vitals and nursing note reviewed.  Constitutional:      General: He is not in acute distress.    Appearance: He is well-developed. He is not toxic-appearing.  HENT:     Head: Normocephalic and atraumatic.  Eyes:     General:        Right eye: No discharge.        Left eye: No discharge.     Conjunctiva/sclera: Conjunctivae normal.  Cardiovascular:     Rate and Rhythm: Regular rhythm. Tachycardia present.  Pulmonary:     Effort: Pulmonary effort is normal. No respiratory distress.     Breath sounds: Normal breath sounds. No wheezing, rhonchi or  rales.  Abdominal:     General: There is no distension.     Palpations: Abdomen is soft.     Tenderness: There is no abdominal tenderness.  Musculoskeletal:     Cervical back: Neck supple.  Skin:    General: Skin is warm and dry.     Findings: No rash.  Neurological:     Mental Status: He is alert.     Comments: Clear speech.   Psychiatric:        Behavior: Behavior normal. Behavior is cooperative.        Thought Content: Thought content includes suicidal ideation.     ED Results / Procedures / Treatments   Labs (all labs ordered are listed, but only abnormal results are displayed) Labs Reviewed  COMPREHENSIVE METABOLIC PANEL - Abnormal; Notable for the following components:      Result Value   Glucose, Bld 121 (*)    Creatinine, Ser 1.25 (*)    All other components within normal limits  CBC WITH DIFFERENTIAL/PLATELET - Abnormal; Notable for the following components:   RBC 6.10 (*)    MCV 79.0 (*)    MCH 25.1 (*)    RDW 18.8 (*)    All other components within normal limits  ETHANOL  RAPID URINE DRUG SCREEN, HOSP PERFORMED    EKG None  Radiology No results found.  Procedures Procedures   Medications Ordered in ED Medications - No data to display  ED Course  I have reviewed the triage vital signs and the nursing notes.  Pertinent labs & imaging results that were available during my care of the patient were reviewed by me and considered in my medical decision making (see chart for details).    MDM Rules/Calculators/A&P                         Patient presents to the ED with complaints of SI.  Vitals w/ mild tachycardia- hx of same on chart review.  Additional history obtained:  Additional history obtained from chart review & nursing note review.   Lab Tests:  I reviewed and interpreted labs, which included:  CBC, CMP, ethanol level, & UDS: creatinine mildly elevated- similar to prior ranges.   ED Course:  05:00: Discussed with APP EN [place]- no bed @  [facility] for  possible transfer.   Patient medically cleared. Disposition per [facility].  The patient has been placed in psychiatric observation due to the need to provide a safe environment for the patient while obtaining psychiatric consultation and evaluation, as well as ongoing medical and medication management to treat the patient's condition.  The patient has not been placed under full IVC at this time.  Portions of this note were generated with [employer]. Dictation errors may occur despite best attempts at proofreading.  Final Clinical Impression(s) / ED Diagnoses Final diagnoses:  Suicidal ideation    Rx / DC Orders ED Discharge Orders    None       Amaryllis Dyke, PA-C 03/04/21 [id number]    Merryl Hacker, MD 03/04/21 [phone]

## 2021-03-05 ENCOUNTER — Ambulatory Visit
Admission: RE | Admit: 2021-03-05 | Discharge: 2021-03-05 | Disposition: A | Discharge status: Home / Self Care | Payer: Medicaid Other | Source: Ambulatory Visit | Attending: Radiation Oncology | Admitting: Radiation Oncology

## 2021-03-05 DIAGNOSIS — F333 Major depressive disorder, recurrent, severe with psychotic symptoms: Secondary | ICD-10-CM | POA: Diagnosis not present

## 2021-03-05 DIAGNOSIS — Z510 Encounter for antineoplastic radiation therapy: Secondary | ICD-10-CM | POA: Diagnosis not present

## 2021-03-05 NOTE — ED Notes (Signed)
Pt returned from [facility] by [facility]

## 2021-03-05 NOTE — Progress Notes (Signed)
CSW team worked on [employer] awaiting review.  Sent out SNF referrals with PASRR pending.

## 2021-03-05 NOTE — Evaluation (Signed)
Physical Therapy Evaluation Patient Details Name: Charles [name] MRN: [id number] DOB: 03-Dec-1965 Today's Date: 03/05/2021   History of Present Illness  Pt is a 55 y/o male presenting to the ED secondary to suicidal ideation and hallucinations. PMH includes bipoloar disorder, HTN, DM, COVID, nonischemic cardiomyopathy, and lymphoma (currently receiving radiation).  Clinical Impression  Pt presenting with problem above with deficits below. Pt overall at a supervision to min guard level for safety with use of rollator. No physical assist required. Reviewed standing HEP as well. Feel pt will not require follow up PT, however, per notes, may be looking for long term placement. Will continue to follow acutely to ensure mobility is maintained and work on higher level balance.     Follow Up Recommendations Other (comment) (no PT follow up (possible long term care placement))    Equipment Recommendations  None recommended by PT    Recommendations for Other Services       Precautions / Restrictions Precautions Precautions: Fall Restrictions Weight Bearing Restrictions: No      Mobility  Bed Mobility               General bed mobility comments: sitting EOB upon entry    Transfers Overall transfer level: Needs assistance Equipment used: 4-wheeled walker Transfers: Sit to/from Stand Sit to Stand: Min guard         General transfer comment: Min guard for safety; no physical assist required. Cues for locking rollator brakes prior to transfers.  Ambulation/Gait Ambulation/Gait assistance: Min guard;Supervision Gait Distance (Feet): 200 Feet Assistive device: 4-wheeled walker Gait Pattern/deviations: Step-through pattern;Decreased stride length;Trunk flexed     General Gait Details: Slower gait, but overall steady using rollator for ambulation. No overt LOB noted. Pt reporting some fatigue with ambulation.  Stairs            Wheelchair Mobility    Modified Rankin  (Stroke Patients Only)       Balance Overall balance assessment: Needs assistance Sitting-balance support: No upper extremity supported;Feet supported Sitting balance-Leahy Scale: Fair     Standing balance support: Bilateral upper extremity supported Standing balance-Leahy Scale: Poor Standing balance comment: reliant on BUE support                             Pertinent Vitals/Pain Pain Assessment: Faces Faces Pain Scale: Hurts little more Pain Location: low back pain Pain Descriptors / Indicators: Aching Pain Intervention(s): Limited activity within patient's tolerance;Monitored during session;Repositioned    Home Living Family/patient expects to be discharged to:: Unsure                 Additional Comments: Reports he was at ALF, but has apparently left ALF.    Prior Function Level of Independence: Independent with assistive device(s)         Comments: Reports using rollator for mobility but would like to transition to cane.     Hand Dominance        Extremity/Trunk Assessment   Upper Extremity Assessment Upper Extremity Assessment: Overall WFL for tasks assessed    Lower Extremity Assessment Lower Extremity Assessment: Generalized weakness    Cervical / Trunk Assessment Cervical / Trunk Assessment: Other exceptions Cervical / Trunk Exceptions: reports low back pain at baseline  Communication   Communication: No difficulties  Cognition Arousal/Alertness: Awake/alert Behavior During Therapy: Flat affect Overall Cognitive Status: No family/caregiver present to determine baseline cognitive functioning  General Comments: likely close to baseline.      General Comments      Exercises General Exercises - Lower Extremity Hip ABduction/ADduction: AROM;Both;10 reps;Standing (using rollator in locked position) Hip Flexion/Marching: AROM;Both;10 reps;Standing (using rollator in locked position)    Assessment/Plan    PT Assessment Patient needs continued PT services  PT Problem List Decreased mobility;Decreased balance;Decreased activity tolerance;Decreased safety awareness;Decreased knowledge of precautions;Decreased cognition       PT Treatment Interventions DME instruction;Gait training;Functional mobility training;Therapeutic activities;Stair training;Balance training;Patient/family education;Therapeutic exercise    PT Goals (Current goals can be found in the Care Plan section)  Acute Rehab PT Goals Patient Stated Goal: to be able to walk with a cane PT Goal Formulation: With patient Time For Goal Achievement: 03/19/21 Potential to Achieve Goals: Fair    Frequency Min 1X/week (for maintenance during hospital stay)   Barriers to discharge        Co-evaluation               AM-PAC PT "6 Clicks" Mobility  Outcome Measure Help needed turning from your back to your side while in a flat bed without using bedrails?: None Help needed moving from lying on your back to sitting on the side of a flat bed without using bedrails?: None Help needed moving to and from a bed to a chair (including a wheelchair)?: A Little Help needed standing up from a chair using your arms (e.g., wheelchair or bedside chair)?: A Little Help needed to walk in hospital room?: A Little Help needed climbing 3-5 steps with a railing? : A Little 6 Click Score: 20    End of Session Equipment Utilized During Treatment: Gait belt Activity Tolerance: Patient tolerated treatment well Patient left: in bed;with call bell/phone within reach (on stretcher in ED) Nurse Communication: Mobility status PT Visit Diagnosis: Unsteadiness on feet (R26.81)    Time: 9485-4627 PT Time Calculation (min) (ACUTE ONLY): 13 min   Charges:   PT Evaluation $PT Eval Low Complexity: 1 Low          [name], DPT  Acute Rehabilitation Services  Pager: [phone] Office: [phone]   [name] 03/05/2021, 2:14 PM

## 2021-03-05 NOTE — ED Notes (Signed)
Pt transported by [facility] to [facility] for radiation treatment.

## 2021-03-05 NOTE — NC FL2 (Signed)
[place] LEVEL OF CARE SCREENING TOOL     IDENTIFICATION  Patient Name: Charles [name] Birthdate: 1966/04/24 Sex: male Admission Date (Current Location): 03/03/2021  [facility] and [place] Number:  Herbalist and Address:  [facility], [place] [address], [place], [place] [address]      Provider Number: [id number]  Attending Physician Name and Address:  Default, Provider, MD  Relative Name and Phone Number:  Wonda Cheng   [phone]    Current Level of Care: Hospital Recommended Level of Care: [place] Prior Approval Number:    Date Approved/Denied:   PASRR Number: Pending  Discharge Plan: SNF    Current Diagnoses: Patient Active Problem List   Diagnosis Date Noted  . MDD (major depressive disorder), recurrent, severe, with psychosis ([place]) 02/28/2021  . Acute hypoxemic respiratory failure ([place]) 01/13/2021  . Acute on chronic heart failure with reduced ejection fraction and diastolic dysfunction ([place]) 01/12/2021  . Schizophrenia ([place]) 01/04/2021  . Diarrhea 01/04/2021  . Pulmonary edema   . Mediastinal adenopathy 12/18/2020  . Adjustment disorder with mixed anxiety and depressed mood 10/30/2020  . Mediastinal lymphadenopathy 10/11/2020  . Shortness of breath 10/11/2020  . Sinus tachycardia 10/11/2020  . Night sweats 10/11/2020  . DM2 (diabetes mellitus, type 2) ([place]) 10/11/2020  . 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 Hypertension 09/25/2020  . BPH (benign prostatic hyperplasia) 09/25/2020  . Insomnia 09/25/2020    Orientation RESPIRATION BLADDER Height & Weight     Self,Time,Situation,Place  Normal Continent Weight:   Height:     BEHAVIORAL SYMPTOMS/MOOD NEUROLOGICAL BOWEL NUTRITION STATUS      Continent Diet (diabetic friendly)  AMBULATORY STATUS COMMUNICATION OF NEEDS Skin   Independent Verbally Normal                        Personal Care Assistance Level of Assistance  Bathing,Feeding,Dressing Bathing Assistance: Limited assistance Feeding assistance: Independent Dressing Assistance: Limited assistance     Functional Limitations Info  Sight,Hearing,Speech Sight Info: Adequate Hearing Info: Adequate Speech Info: Adequate    SPECIAL CARE FACTORS FREQUENCY                       Contractures Contractures Info: Present    Additional Factors Info  Code Status,Allergies Code Status Info: Full Allergies Info: (2) Ibuprofen, Penicillin           Current Medications (03/05/2021):  This is the current hospital active medication list Current Facility-Administered Medications  Medication Dose Route Frequency Provider Last Rate Last Admin  . acetaminophen (TYLENOL) tablet 650 mg  650 mg Oral Q4H PRN Petrucelli, Samantha R, PA-C      . alum & mag hydroxide-simeth (MAALOX/MYLANTA) 200-200-20 MG/5ML suspension 30 mL  30 mL Oral Q6H PRN Petrucelli, Samantha R, PA-C      . atorvastatin (LIPITOR) tablet 10 mg  10 mg Oral Daily Petrucelli, Samantha R, PA-C   10 mg at 03/05/21 1040  . brimonidine (ALPHAGAN) 0.2 % ophthalmic solution 1 drop  1 drop Both Eyes BID Petrucelli, Samantha R, PA-C   1 drop at 03/05/21 1041  . dapagliflozin propanediol (FARXIGA) tablet 10 mg  10 mg Oral Daily Petrucelli, Samantha R, PA-C   10 mg at 03/05/21 1040  . digoxin (LANOXIN) tablet 125 mcg  125 mcg Oral Daily Petrucelli, Samantha R, PA-C   125 mcg at 03/05/21 1040  .  furosemide (LASIX) tablet 40 mg  40 mg Oral Daily Petrucelli, Samantha R, PA-C   40 mg at 03/05/21 1040  . levothyroxine (SYNTHROID) tablet 100 mcg  100 mcg Oral [id number] Pattricia Boss, MD   100 mcg at 03/05/21 [phone]  . metoprolol succinate (TOPROL-XL) 24 hr tablet 75 mg  75 mg Oral Daily Pattricia Boss, MD   75 mg at 03/05/21 1040  . nicotine (NICODERM CQ - dosed in mg/24 hours) patch 21 mg  21 mg Transdermal Daily Petrucelli, Samantha R, PA-C      . ondansetron  (ZOFRAN) tablet 4 mg  4 mg Oral Q8H PRN Petrucelli, Samantha R, PA-C      . pantoprazole (PROTONIX) EC tablet 40 mg  40 mg Oral Daily Petrucelli, Samantha R, PA-C   40 mg at 03/05/21 1040  . sacubitril-valsartan (ENTRESTO) 24-26 mg per tablet  1 tablet Oral BID Petrucelli, Samantha R, PA-C   1 tablet at 03/05/21 1040  . senna (SENOKOT) tablet 17.2 mg  2 tablet Oral BID Petrucelli, Samantha R, PA-C   17.2 mg at 03/05/21 1039  . spironolactone (ALDACTONE) tablet 12.5 mg  12.5 mg Oral Daily Petrucelli, Samantha R, PA-C   12.5 mg at 03/05/21 1040  . tamsulosin (FLOMAX) capsule 0.4 mg  0.4 mg Oral Daily Petrucelli, Samantha R, PA-C   0.4 mg at 03/05/21 1039   Current Outpatient Medications  Medication Sig Dispense Refill  . atorvastatin (LIPITOR) 10 MG tablet Take 10 mg by mouth daily.    . brimonidine (ALPHAGAN) 0.2 % ophthalmic solution Place 1 drop into both eyes 2 (two) times daily.     . dapagliflozin propanediol (FARXIGA) 10 MG TABS tablet Take 1 tablet (10 mg total) by mouth daily. 30 tablet 3  . digoxin (LANOXIN) 0.125 MG tablet Take 1 tablet (125 mcg total) by mouth daily. 30 tablet 11  . furosemide (LASIX) 40 MG tablet Take 1 tablet (40 mg total) by mouth daily. (Patient taking differently: Take 40 mg by mouth in the morning.) 30 tablet 1  . levothyroxine (SYNTHROID) 100 MCG tablet Take 100 mcg by mouth daily before breakfast.    . metoprolol succinate (TOPROL-XL) 25 MG 24 hr tablet Take 75 mg by mouth in the morning.    . pantoprazole (PROTONIX) 40 MG tablet Take 40 mg by mouth daily before breakfast.    . polyethylene glycol (MIRALAX / GLYCOLAX) 17 g packet Take 17 g by mouth 2 (two) times daily. 14 each 0  . sacubitril-valsartan (ENTRESTO) 24-26 MG Take 1 tablet by mouth 2 (two) times daily.    [place] senna (SENOKOT) 8.6 MG TABS tablet Take 2 tablets (17.2 mg total) by mouth 2 (two) times daily. 120 tablet 0  . spironolactone (ALDACTONE) 25 MG tablet Take 0.5 tablets (12.5 mg total) by mouth  daily. 30 tablet 0  . tamsulosin (FLOMAX) 0.4 MG CAPS capsule Take 0.4 mg by mouth in the morning.    [place] acetaminophen (TYLENOL) 325 MG tablet Take 650 mg by mouth every 6 (six) hours as needed for moderate pain.    . blood glucose meter kit and supplies KIT Dispense based on patient and insurance preference. Use up to four times daily as directed. (FOR ICD-9 250.00, 250.01). 1 each 0  . Ensure Plus (ENSURE PLUS) LIQD Take 237 mLs by mouth 2 (two) times daily between meals. chocolate (Patient not taking: Reported on 03/04/2021)    . Insulin Pen Needle (PEN NEEDLES 29GX1/2") 29G X 12MM MISC 1 each by Does  not apply route daily. 100 each 0  . latanoprost (XALATAN) 0.005 % ophthalmic solution Place 1 drop into both eyes at bedtime.  (Patient not taking: Reported on 03/04/2021)    . melatonin 3 MG TABS tablet Take 3 mg by mouth at bedtime. (Patient not taking: Reported on 03/04/2021)    . metoprolol succinate (TOPROL XL) 100 MG 24 hr tablet Take 1 tablet (100 mg total) by mouth daily. Take with or immediately following a meal. (Patient not taking: Reported on 03/04/2021) 30 tablet 11  . metoprolol tartrate (LOPRESSOR) 25 MG tablet Take 75 mg by mouth daily. (Patient not taking: Reported on 03/04/2021)    . risperiDONE (RISPERDAL) 0.5 MG tablet Take 1 tablet (0.5 mg total) by mouth at bedtime. (Patient not taking: No sig reported) 30 tablet 0     Discharge Medications: Please see discharge summary for a list of discharge medications.  Relevant Imaging Results:  Relevant Lab Results:   Additional Information [phone]/ Pt has had Covid vaccine/ Pt has lymphoma  Sherley Mckenney, LCSW

## 2021-03-05 NOTE — Progress Notes (Signed)
Please call [facility] [employer] @ [phone] to see if [facility] can be set up for patient to travel to and from [place] for Pt's radiation appointment today.   If [facility] can be arranged, please cancel [facility].

## 2021-03-06 ENCOUNTER — Ambulatory Visit
Admission: RE | Admit: 2021-03-06 | Discharge: 2021-03-06 | Disposition: A | Discharge status: Home / Self Care | Payer: Medicaid Other | Source: Ambulatory Visit | Attending: Radiation Oncology | Admitting: Radiation Oncology

## 2021-03-06 DIAGNOSIS — F333 Major depressive disorder, recurrent, severe with psychotic symptoms: Secondary | ICD-10-CM | POA: Diagnosis not present

## 2021-03-06 DIAGNOSIS — Z510 Encounter for antineoplastic radiation therapy: Secondary | ICD-10-CM | POA: Diagnosis not present

## 2021-03-06 NOTE — Social Work (Signed)
CSW met with Pt at bedside. Pt states that he is feeling "worn out" after radiation treatment. Pt rates his depression as a "4 or 5 " out of 10 today.  List factors that contribute positively as; leaving [facility], speaking with his girlfriend on the phone, the are he's receiving while in the ED.  Pt requested a coffee and a water, CSW provided decaf and ice water.  Pt verbalized thanks.

## 2021-03-06 NOTE — NC FL2 (Signed)
[place] LEVEL OF CARE SCREENING TOOL     IDENTIFICATION  Patient Name: Charles [name] Birthdate: 06-27-1966 Sex: male Admission Date (Current Location): 03/03/2021  [facility] and [place] Number:  Herbalist and Address:  [facility], [place] [address], [place], [place] [address]      Provider Number: [id number]  Attending Physician Name and Address:  Default, Provider, MD  Relative Name and Phone Number:  Wonda Cheng   [phone]    Current Level of Care: Hospital Recommended Level of Care: [place] Prior Approval Number:    Date Approved/Denied:   PASRR Number: Pending  Discharge Plan: Other (Comment) (Assisted Living)    Current Diagnoses: Patient Active Problem List   Diagnosis Date Noted  . MDD (major depressive disorder), recurrent, severe, with psychosis ([place]) 02/28/2021  . Acute hypoxemic respiratory failure ([place]) 01/13/2021  . Acute on chronic heart failure with reduced ejection fraction and diastolic dysfunction ([place]) 01/12/2021  . Schizophrenia ([place]) 01/04/2021  . Diarrhea 01/04/2021  . Pulmonary edema   . Mediastinal adenopathy 12/18/2020  . Adjustment disorder with mixed anxiety and depressed mood 10/30/2020  . Mediastinal lymphadenopathy 10/11/2020  . Shortness of breath 10/11/2020  . Sinus tachycardia 10/11/2020  . Night sweats 10/11/2020  . DM2 (diabetes mellitus, type 2) ([place]) 10/11/2020  . 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 Hypertension 09/25/2020  . BPH (benign prostatic hyperplasia) 09/25/2020  . Insomnia 09/25/2020    Orientation RESPIRATION BLADDER Height & Weight     Self,Time,Situation,Place  Normal Continent Weight:   Height:     BEHAVIORAL SYMPTOMS/MOOD NEUROLOGICAL BOWEL NUTRITION STATUS      Continent Diet (diabetic friendly)  AMBULATORY STATUS COMMUNICATION OF NEEDS Skin   Independent Verbally Normal                        Personal Care Assistance Level of Assistance  Bathing,Feeding,Dressing Bathing Assistance: Limited assistance Feeding assistance: Independent Dressing Assistance: Limited assistance     Functional Limitations Info  Sight,Hearing,Speech Sight Info: Adequate Hearing Info: Adequate Speech Info: Adequate    SPECIAL CARE FACTORS FREQUENCY                       Contractures Contractures Info: Not present    Additional Factors Info  Code Status Code Status Info: Full Allergies Info: (2) Ibuprofen, Penicillin           Current Medications (03/06/2021):  This is the current hospital active medication list Current Facility-Administered Medications  Medication Dose Route Frequency Provider Last Rate Last Admin  . acetaminophen (TYLENOL) tablet 650 mg  650 mg Oral Q4H PRN Petrucelli, Samantha R, PA-C      . alum & mag hydroxide-simeth (MAALOX/MYLANTA) 200-200-20 MG/5ML suspension 30 mL  30 mL Oral Q6H PRN Petrucelli, Samantha R, PA-C      . atorvastatin (LIPITOR) tablet 10 mg  10 mg Oral Daily Petrucelli, Samantha R, PA-C   10 mg at 03/06/21 1026  . brimonidine (ALPHAGAN) 0.2 % ophthalmic solution 1 drop  1 drop Both Eyes BID Petrucelli, Samantha R, PA-C   1 drop at 03/06/21 1026  . dapagliflozin propanediol (FARXIGA) tablet 10 mg  10 mg Oral Daily Petrucelli, Samantha R, PA-C   10 mg at 03/06/21 1026  . digoxin (LANOXIN) tablet 125 mcg  125 mcg Oral Daily Petrucelli, Samantha R, PA-C   125 mcg at  03/06/21 1026  . furosemide (LASIX) tablet 40 mg  40 mg Oral Daily Petrucelli, Samantha R, PA-C   40 mg at 03/06/21 1026  . levothyroxine (SYNTHROID) tablet 100 mcg  100 mcg Oral [id number] Pattricia Boss, MD   100 mcg at 03/06/21 [phone]  . metoprolol succinate (TOPROL-XL) 24 hr tablet 75 mg  75 mg Oral Daily Pattricia Boss, MD   75 mg at 03/06/21 1026  . nicotine (NICODERM CQ - dosed in mg/24 hours) patch 21 mg  21 mg Transdermal Daily Petrucelli, Samantha R, PA-C       . ondansetron (ZOFRAN) tablet 4 mg  4 mg Oral Q8H PRN Petrucelli, Samantha R, PA-C      . pantoprazole (PROTONIX) EC tablet 40 mg  40 mg Oral Daily Petrucelli, Samantha R, PA-C   40 mg at 03/06/21 1026  . sacubitril-valsartan (ENTRESTO) 24-26 mg per tablet  1 tablet Oral BID Petrucelli, Samantha R, PA-C   1 tablet at 03/06/21 1026  . senna (SENOKOT) tablet 17.2 mg  2 tablet Oral BID Petrucelli, Samantha R, PA-C   17.2 mg at 03/06/21 1026  . spironolactone (ALDACTONE) tablet 12.5 mg  12.5 mg Oral Daily Petrucelli, Samantha R, PA-C   12.5 mg at 03/06/21 1026  . tamsulosin (FLOMAX) capsule 0.4 mg  0.4 mg Oral Daily Petrucelli, Samantha R, PA-C   0.4 mg at 03/06/21 1026   No current outpatient medications on file.     Discharge Medications: Please see discharge summary for a list of discharge medications.  Relevant Imaging Results:  Relevant Lab Results:   Additional Information [phone]/ Pt has had Covid vaccine/ Pt has lymphoma  Ryelan [place], LCSW

## 2021-03-06 NOTE — ED Notes (Signed)
Pt resting in bed with eyes closed Respirations are even and non-labored, pt does not appear in distress Call light within reach

## 2021-03-06 NOTE — ED Provider Notes (Signed)
Emergency Medicine Observation Re-evaluation Note  LORAN FLEET is a 55 y.o. male, seen on rounds today.  Pt initially presented to the ED for complaints of anxiety/stress related to recently signing self out of adult living facility and being homeless.   Physical Exam  BP (!) 102/50   Pulse (!) 108   Temp 98.1 F (36.7 C) (Oral)   Resp 18   SpO2 100%  Physical Exam General: resting  Cardiac: regular rate Lungs: breathing comfortably Psych: calm, resting.   ED Course / MDM  EKG:EKG Interpretation  Date/Time:  Tuesday March 03 2021 20:53:47 EDT Ventricular Rate:  103 PR Interval:  166 QRS Duration: 104 QT Interval:  366 QTC Calculation: 479 R Axis:   112 Text Interpretation: Sinus tachycardia with Premature atrial complexes with Abberant conduction Biatrial enlargement Right axis deviation Abnormal ECG Confirmed by Elnora Morrison [phone]) on 03/04/2021 7:10:54 PM   I have reviewed the labs performed to date as well as medications administered while in observation.  Recent changes in the last 24 hours include TOC team re-evaluation and placement efforts.   Plan  Current plan is for assisting patient with finding SNF.     Lajean Saver, MD 03/06/21 [phone]

## 2021-03-06 NOTE — ED Notes (Signed)
Pt transported to [facility] by [employer] 

## 2021-03-06 NOTE — ED Notes (Signed)
The pt sleeps awhile then talks on the phone no distress  Excited about dinner

## 2021-03-06 NOTE — Progress Notes (Signed)
CSW faxed referral for assisted living to [facility]

## 2021-03-06 NOTE — ED Notes (Signed)
Pt talking on the phone. 

## 2021-03-07 LAB — COMPREHENSIVE METABOLIC PANEL
ALT: 18 U/L (ref 0–44)
AST: 20 U/L (ref 15–41)
Albumin: 3.6 g/dL (ref 3.5–5.0)
Alkaline Phosphatase: 80 U/L (ref 38–126)
Anion gap: 9 (ref 5–15)
BUN: 14 mg/dL (ref 6–20)
CO2: 23 mmol/L (ref 22–32)
Calcium: 9.1 mg/dL (ref 8.9–10.3)
Chloride: 104 mmol/L (ref 98–111)
Creatinine, Ser: 1.06 mg/dL (ref 0.61–1.24)
GFR, Estimated: >60 mL/min (ref >60)
Glucose, Bld: 113 mg/dL — ABNORMAL HIGH (ref 70–99)
Potassium: 4.2 mmol/L (ref 3.5–5.1)
Sodium: 136 mmol/L (ref 135–145)
Total Bilirubin: 0.7 mg/dL (ref 0.3–1.2)
Total Protein: 6.8 g/dL (ref 6.5–8.1)

## 2021-03-07 LAB — CBC
HCT: 49.3 % (ref 39.0–52.0)
Hemoglobin: 16.2 g/dL (ref 13.0–17.0)
MCH: 25.6 pg — ABNORMAL LOW (ref 26.0–34.0)
MCHC: 32.9 g/dL (ref 30.0–36.0)
MCV: 77.9 fL — ABNORMAL LOW (ref 80.0–100.0)
Platelets: 156 10*3/uL (ref 150–400)
RBC: 6.33 MIL/uL — ABNORMAL HIGH (ref 4.22–5.81)
RDW: 18.3 % — ABNORMAL HIGH (ref 11.5–15.5)
WBC: 6.3 10*3/uL (ref 4.0–10.5)
nRBC: 0 % (ref 0.0–0.2)

## 2021-03-07 LAB — DIGOXIN LEVEL: Digoxin Level: 0.3 ng/mL — ABNORMAL LOW (ref 0.8–2.0)

## 2021-03-07 NOTE — ED Provider Notes (Addendum)
Emergency Medicine Observation Re-evaluation Note  Charles [name] is a 55 y.o. male, seen on rounds today.  Pt initially presented to the ED for complaints of anxiety and depressed mood related to stress over housing situation/homelessness.   Physical Exam  BP (!) 86/57 (BP Location: Left Arm)   Pulse 86   Temp 98.4 F (36.9 C) (Oral)   Resp 18   SpO2 94%  Physical Exam General: alert, content Cardiac: regular rate Lungs: breathing comfortably Psych: alert, content, normal mood/affect  ED Course / MDM  EKG:EKG Interpretation  Date/Time:  Tuesday March 03 2021 20:53:47 EDT Ventricular Rate:  103 PR Interval:  166 QRS Duration: 104 QT Interval:  366 QTC Calculation: 479 R Axis:   112 Text Interpretation: Sinus tachycardia with Premature atrial complexes with Abberant conduction Biatrial enlargement Right axis deviation Abnormal ECG Confirmed by Elnora Morrison [phone]) on 03/04/2021 7:10:54 PM   I have reviewed the labs performed to date as well as medications administered while in observation.  Recent changes in the last 24 hours include TOC team reassessment and continued work on finding place for patient to live.   Plan  Current plan is for TOC to find placement.  Last bp noted to be low. No fever/chills. Will recheck vitals. Will hold bp meds for now.  Encourage po. Repeat labs.   Results for orders placed or performed during the hospital encounter of 03/03/21  Resp Panel by RT-PCR (Flu A&B, Covid) Nasopharyngeal Swab   Specimen: Nasopharyngeal Swab; Nasopharyngeal(NP) swabs in vial transport medium  Result Value Ref Range   SARS Coronavirus 2 by RT PCR NEGATIVE NEGATIVE   Influenza A by PCR NEGATIVE NEGATIVE   Influenza B by PCR NEGATIVE NEGATIVE  Comprehensive metabolic panel  Result Value Ref Range   Sodium 138 135 - 145 mmol/L   Potassium 4.5 3.5 - 5.1 mmol/L   Chloride 104 98 - 111 mmol/L   CO2 27 22 - 32 mmol/L   Glucose, Bld 121 (H) 70 - 99 mg/dL   BUN 15 6 - 20  mg/dL   Creatinine, Ser 1.25 (H) 0.61 - 1.24 mg/dL   Calcium 9.3 8.9 - 10.3 mg/dL   Total Protein 7.4 6.5 - 8.1 g/dL   Albumin 4.0 3.5 - 5.0 g/dL   AST 18 15 - 41 U/L   ALT 20 0 - 44 U/L   Alkaline Phosphatase 82 38 - 126 U/L   Total Bilirubin 0.7 0.3 - 1.2 mg/dL   GFR, Estimated >60 >60 mL/min   Anion gap 7 5 - 15  CBC with Differential  Result Value Ref Range   WBC 7.1 4.0 - 10.5 K/uL   RBC 6.10 (H) 4.22 - 5.81 MIL/uL   Hemoglobin 15.3 13.0 - 17.0 g/dL   HCT 48.2 39.0 - 52.0 %   MCV 79.0 (L) 80.0 - 100.0 fL   MCH 25.1 (L) 26.0 - 34.0 pg   MCHC 31.7 30.0 - 36.0 g/dL   RDW 18.8 (H) 11.5 - 15.5 %   Platelets 182 150 - 400 K/uL   nRBC 0.0 0.0 - 0.2 %   Neutrophils Relative % 77 %   Neutro Abs 5.5 1.7 - 7.7 K/uL   Lymphocytes Relative 12 %   Lymphs Abs 0.8 0.7 - 4.0 K/uL   Monocytes Relative 8 %   Monocytes Absolute 0.5 0.1 - 1.0 K/uL   Eosinophils Relative 2 %   Eosinophils Absolute 0.1 0.0 - 0.5 K/uL   Basophils Relative 1 %  Basophils Absolute 0.0 0.0 - 0.1 K/uL   Immature Granulocytes 0 %   Abs Immature Granulocytes 0.02 0.00 - 0.07 K/uL  Ethanol  Result Value Ref Range   Alcohol, Ethyl (B) <10 <10 mg/dL  Urine rapid drug screen (hosp performed)  Result Value Ref Range   Opiates NONE DETECTED NONE DETECTED   Cocaine NONE DETECTED NONE DETECTED   Benzodiazepines NONE DETECTED NONE DETECTED   Amphetamines NONE DETECTED NONE DETECTED   Tetrahydrocannabinol NONE DETECTED NONE DETECTED   Barbiturates NONE DETECTED NONE DETECTED  Comprehensive metabolic panel  Result Value Ref Range   Sodium 136 135 - 145 mmol/L   Potassium 4.2 3.5 - 5.1 mmol/L   Chloride 104 98 - 111 mmol/L   CO2 23 22 - 32 mmol/L   Glucose, Bld 113 (H) 70 - 99 mg/dL   BUN 14 6 - 20 mg/dL   Creatinine, Ser 1.06 0.61 - 1.24 mg/dL   Calcium 9.1 8.9 - 10.3 mg/dL   Total Protein 6.8 6.5 - 8.1 g/dL   Albumin 3.6 3.5 - 5.0 g/dL   AST 20 15 - 41 U/L   ALT 18 0 - 44 U/L   Alkaline Phosphatase 80 38 -  126 U/L   Total Bilirubin 0.7 0.3 - 1.2 mg/dL   GFR, Estimated >60 >60 mL/min   Anion gap 9 5 - 15  CBC  Result Value Ref Range   WBC 6.3 4.0 - 10.5 K/uL   RBC 6.33 (H) 4.22 - 5.81 MIL/uL   Hemoglobin 16.2 13.0 - 17.0 g/dL   HCT 49.3 39.0 - 52.0 %   MCV 77.9 (L) 80.0 - 100.0 fL   MCH 25.6 (L) 26.0 - 34.0 pg   MCHC 32.9 30.0 - 36.0 g/dL   RDW 18.3 (H) 11.5 - 15.5 %   Platelets 156 150 - 400 K/uL   nRBC 0.0 0.0 - 0.2 %  Digoxin level  Result Value Ref Range   Digoxin Level 0.3 (L) 0.8 - 2.0 ng/mL    Labs reviewed/interpreted by me - hgb normal, wbc normal, chem normal.   Pt alert, oriented. No faintness or dizziness. No pain. No sob. Currently bp is normal.         Lajean Saver, MD 03/07/21 [facility] [place]

## 2021-03-08 NOTE — ED Notes (Signed)
Pt eating meal tray, NADN, calm and cooperative

## 2021-03-08 NOTE — ED Notes (Signed)
Laying in Biomedical scientist. RR e/u on RA. Noted occasional movement. Bed low and locked. Side rails up x2.

## 2021-03-08 NOTE — ED Notes (Signed)
Breakfast Ordered 

## 2021-03-08 NOTE — ED Notes (Signed)
Pt offered blanket stated "no thank you "im warm blooded." Resting on side in bed, NAD." Scheduled medication given.

## 2021-03-08 NOTE — ED Notes (Signed)
Pt refused to take morning medication at this time

## 2021-03-09 ENCOUNTER — Other Ambulatory Visit: Payer: Self-pay | Admitting: Radiation Oncology

## 2021-03-09 ENCOUNTER — Ambulatory Visit
Admission: RE | Admit: 2021-03-09 | Discharge: 2021-03-09 | Disposition: A | Discharge status: Home / Self Care | Payer: Medicaid Other | Source: Ambulatory Visit | Attending: Radiation Oncology | Admitting: Radiation Oncology

## 2021-03-09 ENCOUNTER — Other Ambulatory Visit: Payer: Self-pay

## 2021-03-09 DIAGNOSIS — C8102 Nodular lymphocyte predominant Hodgkin lymphoma, intrathoracic lymph nodes: Secondary | ICD-10-CM | POA: Diagnosis present

## 2021-03-09 DIAGNOSIS — Z510 Encounter for antineoplastic radiation therapy: Secondary | ICD-10-CM | POA: Diagnosis not present

## 2021-03-09 MED ORDER — LIDOCAINE VISCOUS HCL 2 % MT SOLN: OROMUCOSAL | 1 refills | Status: DC

## 2021-03-09 NOTE — ED Notes (Addendum)
PTAR called for Pt radiation treatment at [facility] was called 1st and they stated they are unable to transport patient

## 2021-03-09 NOTE — Progress Notes (Signed)
CSW met with Pt at bedside. Pt states that currently his depression is a 7 on a scale of 1-10. Per Pt, contributing factors to rise are :staying in the hall, it's too noisy' CSW explained that in the ED Pt frequently do sleep in the hall due to lack of coverage. Per Pt, factors that help to alleviate depression include speaking with gf on phone which he has been doing today.  Pt denies SI/HI.  CSW and Pt discussed options for assisted living. Pt gave verbal permission for team to seek placement outside of [facility].  CSW faxed referrals to several ALFs in [facility] including: [facility]

## 2021-03-09 NOTE — ED Notes (Signed)
Pt received breakfast tray 

## 2021-03-09 NOTE — Progress Notes (Signed)
CSW contacted [facility] to see if they will accept patient. [place] told CSW she never received it. CSW will fax again

## 2021-03-09 NOTE — Progress Notes (Signed)
Physical Therapy Treatment Patient Details Name: Charles [name] MRN: [id number] DOB: [birth year] Today's Date: 03/09/2021    History of Present Illness Pt is a 55 y/o male presenting to the ED secondary to suicidal ideation and hallucinations. PMH includes bipoloar disorder, HTN, DM, COVID, nonischemic cardiomyopathy, and lymphoma (currently receiving radiation).    PT Comments    Pt progressing towards goals. Only wanting to practice gait with cane this session. Required continuous cues for sequencing using cane. Min guard A for safety and required seated rest X1. Recommend continued use of rollator at this time for increase safety. Current recommendations appropriate. Will continue to follow acutely.   Follow Up Recommendations  Other (comment) (no PT follow up (possible long term care placement))     Equipment Recommendations  None recommended by PT    Recommendations for Other Services       Precautions / Restrictions Precautions Precautions: Fall Restrictions Weight Bearing Restrictions: No    Mobility  Bed Mobility Overal bed mobility: Needs Assistance Bed Mobility: Supine to Sit     Supine to sit: Supervision     General bed mobility comments: supervision for safety.    Transfers Overall transfer level: Needs assistance Equipment used: Straight cane Transfers: Sit to/from Stand Sit to Stand: Min guard         General transfer comment: Min guard for safety; no physical assist required.  Ambulation/Gait Ambulation/Gait assistance: Min guard Gait Distance (Feet): 200 Feet Assistive device: Straight cane Gait Pattern/deviations: Step-through pattern;Decreased stride length;Trunk flexed Gait velocity: Decreased   General Gait Details: Brought cane to practice with during gait. Continuous cues required for sequencing. Demonstrated increased fatigue and required seated rest X1.   Stairs             Wheelchair Mobility    Modified Rankin (Stroke  Patients Only)       Balance Overall balance assessment: Needs assistance Sitting-balance support: No upper extremity supported;Feet supported Sitting balance-Leahy Scale: Fair     Standing balance support: Single extremity supported Standing balance-Leahy Scale: Poor Standing balance comment: reliant on at least 1 UE support                            Cognition Arousal/Alertness: Awake/alert Behavior During Therapy: Flat affect Overall Cognitive Status: No family/caregiver present to determine baseline cognitive functioning                                 General Comments: likely close to baseline.      Exercises      General Comments        Pertinent Vitals/Pain Pain Assessment: Faces Faces Pain Scale: Hurts little more Pain Location: low back pain Pain Descriptors / Indicators: Aching Pain Intervention(s): Limited activity within patient's tolerance;Monitored during session;Repositioned    Home Living                      Prior Function            PT Goals (current goals can now be found in the care plan section) Acute Rehab PT Goals Patient Stated Goal: to be able to walk with a cane PT Goal Formulation: With patient Time For Goal Achievement: 03/19/21 Potential to Achieve Goals: Fair Progress towards PT goals: Progressing toward goals    Frequency    Min 1X/week (for maintenance during hospital stay)  PT Plan Current plan remains appropriate    Co-evaluation              AM-PAC PT "6 Clicks" Mobility   Outcome Measure  Help needed turning from your back to your side while in a flat bed without using bedrails?: None Help needed moving from lying on your back to sitting on the side of a flat bed without using bedrails?: None Help needed moving to and from a bed to a chair (including a wheelchair)?: A Little Help needed standing up from a chair using your arms (e.g., wheelchair or bedside chair)?: A  Little Help needed to walk in hospital room?: A Little Help needed climbing 3-5 steps with a railing? : A Little 6 Click Score: 20    End of Session Equipment Utilized During Treatment: Gait belt Activity Tolerance: Patient tolerated treatment well Patient left: with call bell/phone within reach;in chair (in chair in ED) Nurse Communication: Mobility status PT Visit Diagnosis: Unsteadiness on feet (R26.81)     Time: 1003-1015 PT Time Calculation (min) (ACUTE ONLY): 12 min  Charges:  $Gait Training: 8-22 mins                     [name], DPT  Acute Rehabilitation Services  Pager: [phone] Office: [phone]    [name] 03/09/2021, 2:46 PM

## 2021-03-09 NOTE — Progress Notes (Signed)
PT Cancellation Note  Patient Details Name: Charles [name] MRN: [id number] DOB: 14-Oct-1966   Cancelled Treatment:    Reason Eval/Treat Not Completed: Patient at procedure or test/unavailable Pt transferred to [facility] for treatment. Will follow up as schedule allows.   [name], DPT  Acute Rehabilitation Services  Pager: [phone] Office: [phone]    [name] 03/09/2021, 8:59 AM

## 2021-03-09 NOTE — ED Notes (Signed)
[facility] here for patient transport

## 2021-03-10 ENCOUNTER — Ambulatory Visit
Admission: RE | Admit: 2021-03-10 | Discharge: 2021-03-10 | Disposition: A | Discharge status: Home / Self Care | Payer: Medicaid Other | Source: Ambulatory Visit | Attending: Radiation Oncology | Admitting: Radiation Oncology

## 2021-03-10 DIAGNOSIS — Z510 Encounter for antineoplastic radiation therapy: Secondary | ICD-10-CM | POA: Diagnosis not present

## 2021-03-10 NOTE — Progress Notes (Signed)
CSW followed-up on the below facilities   [facility]- Left message   [facility] coming this morning to do an assessment with patient.   [facility]- Left Message   [facility]-- Left Message

## 2021-03-10 NOTE — Progress Notes (Signed)
CSW got a message from patients nurse saying that he is ready to leave the hospital and that he found a bed at [facility]. CSW went to speak with patient who stated he has a bed and they are willing to hold it until Saturday so he can finish his radiation. Patient stated they only have a top bunk and he told Charles [name] that he will take it. Patient stated that Charles [name] stated they will give him a bottom bunk. Patient stated that Charles [name] will also pick him up from the hospital Saturday. CSW asked who Charles [name] was and he stated that he is the guy who provided him transportation from his previous ALF. Patient stated that he is tired of relying on people and that he can eat, bath, and take care of himself.    CSW contacted [facility] and confirmed they will hold his bed until Saturday and that they only have a top bunk right now.

## 2021-03-10 NOTE — ED Notes (Signed)
Received verbal report from [facility]

## 2021-03-10 NOTE — Progress Notes (Signed)
CSW met with Pt at bedside. Pt reports that radiation went well and that he is feeling"pretty good"  Pt rates his depression as 1or 2 on a scale of 10.  Pt ascribes low rate to positive feelings about going to [facility] on Saturday.  Pt states that he has a sister, daughter and granddaughter in the [place] area that can also serve as supports for him while he is living there.  Pt denies SI/HI

## 2021-03-10 NOTE — ED Notes (Addendum)
CSW at bedside.

## 2021-03-10 NOTE — ED Notes (Signed)
Pt back in ED at this time 

## 2021-03-10 NOTE — ED Notes (Signed)
Pt to [facility] at this time with PTAR.

## 2021-03-10 NOTE — Progress Notes (Signed)
CSW received a call back from [facility] who denied patient because patient is too young.

## 2021-03-10 NOTE — ED Notes (Signed)
Received verbal report from [employer]

## 2021-03-10 NOTE — Progress Notes (Signed)
[facility] denied patient because of his psychological issues.

## 2021-03-10 NOTE — ED Notes (Signed)
Moved pt from 50 to 16

## 2021-03-11 ENCOUNTER — Ambulatory Visit: Payer: Medicaid Other | Admitting: Radiation Oncology

## 2021-03-11 ENCOUNTER — Other Ambulatory Visit: Payer: Self-pay

## 2021-03-11 ENCOUNTER — Ambulatory Visit
Admission: RE | Admit: 2021-03-11 | Discharge: 2021-03-11 | Disposition: A | Discharge status: Home / Self Care | Payer: Medicaid Other | Source: Ambulatory Visit | Attending: Radiation Oncology | Admitting: Radiation Oncology

## 2021-03-11 DIAGNOSIS — Z510 Encounter for antineoplastic radiation therapy: Secondary | ICD-10-CM | POA: Diagnosis not present

## 2021-03-11 NOTE — ED Notes (Signed)
Pt alert and oriented and watching Tv at this time. Patient has no complaints at this time.

## 2021-03-11 NOTE — Progress Notes (Signed)
[facility] denied patient for ALF due to facility being private pay. Patient does not have enough money to pay out of pocket.

## 2021-03-11 NOTE — ED Notes (Signed)
Pt resting comfortably in bed with eyes closed Respirations are even and non-labored.  skin is warm, dry and intact   Call light within reach

## 2021-03-11 NOTE — ED Notes (Signed)
PTAR left with pt at this time

## 2021-03-11 NOTE — ED Notes (Signed)
PTAR arrived for transport to [facility] for tx

## 2021-03-11 NOTE — ED Notes (Signed)
Pt returned from [facility] by [facility]  Sitting on side of bed eating breakfast

## 2021-03-12 ENCOUNTER — Encounter: Payer: Self-pay | Admitting: Radiation Oncology

## 2021-03-12 ENCOUNTER — Ambulatory Visit
Admission: RE | Admit: 2021-03-12 | Discharge: 2021-03-12 | Disposition: A | Discharge status: Home / Self Care | Payer: Medicaid Other | Source: Ambulatory Visit | Attending: Radiation Oncology | Admitting: Radiation Oncology

## 2021-03-12 DIAGNOSIS — Z510 Encounter for antineoplastic radiation therapy: Secondary | ICD-10-CM | POA: Diagnosis not present

## 2021-03-12 NOTE — ED Notes (Signed)
Report given to Gaetana Michaelis, RN

## 2021-03-12 NOTE — ED Notes (Signed)
Breakfast Ordered 

## 2021-03-12 NOTE — ED Notes (Signed)
Pt transported to [facility] for [employer]. Upon transfer pt stable, A&O x4.

## 2021-03-12 NOTE — ED Notes (Signed)
Charles [name]  the girlfriend  stated to please call [phone] tomorrow

## 2021-03-13 ENCOUNTER — Telehealth: Payer: Self-pay | Admitting: Hematology and Oncology

## 2021-03-13 NOTE — Telephone Encounter (Signed)
Called pt to r/s appts per 5/6 sch msg. No answer. Left msg for pt to call back to r/s.

## 2021-03-13 NOTE — Progress Notes (Signed)
Patient stated he found himself a hotel to go to tomorrow in [place]. Patient stated he no longer wants to go to the [facility] because he has family in [place] on drugs. Patient stated he did not want to be around that. CSW offered to provide ride and patient stated he has one for tomorrow morning.

## 2021-03-13 NOTE — ED Notes (Signed)
Pt provided with coffee at this time. Pt very calm and cooperative, watching TV.

## 2021-03-13 NOTE — Progress Notes (Signed)
Physical Therapy Treatment Patient Details Name: Charles [name] MRN: [id number] DOB: [birth year] Today's Date: 03/13/2021    History of Present Illness Pt is a 55 y/o male presenting to the ED secondary to suicidal ideation and hallucinations. PMH includes bipoloar disorder, HTN, DM, COVID, nonischemic cardiomyopathy, and lymphoma (radiation completed 5/5).    PT Comments    Pt continues to progress towards goals. Requiring min guard A for mobility with use of cane as pt continues to be mildly unsteady. Reviewed HEP with pt as well. Current recommendations appropriate. Will continue to follow acutely.    Follow Up Recommendations  No PT follow up     Equipment Recommendations  None recommended by PT    Recommendations for Other Services       Precautions / Restrictions Precautions Precautions: Fall Restrictions Weight Bearing Restrictions: No    Mobility  Bed Mobility Overal bed mobility: Modified Independent                  Transfers Overall transfer level: Needs assistance Equipment used: Straight cane Transfers: Sit to/from Stand Sit to Stand: Min guard         General transfer comment: Min guard for safety; no physical assist required.  Ambulation/Gait Ambulation/Gait assistance: Min guard Gait Distance (Feet): 125 Feet Assistive device: Straight cane Gait Pattern/deviations: Step-through pattern;Decreased stride length;Trunk flexed Gait velocity: Decreased   General Gait Details: Improved tolerance for gait with cane and improved mechanics. Still slightly unsteady and requiring min guard A for safety.   Stairs             Wheelchair Mobility    Modified Rankin (Stroke Patients Only)       Balance Overall balance assessment: Needs assistance Sitting-balance support: No upper extremity supported;Feet supported Sitting balance-Leahy Scale: Good     Standing balance support: Single extremity supported Standing balance-Leahy Scale:  Poor Standing balance comment: reliant on at least 1 UE support                            Cognition Arousal/Alertness: Awake/alert Behavior During Therapy: Flat affect Overall Cognitive Status: No family/caregiver present to determine baseline cognitive functioning                                 General Comments: likely close to baseline.      Exercises General Exercises - Lower Extremity Long Arc Quad: AROM;Both;10 reps;Seated Hip ABduction/ADduction: AROM;Both;10 reps;Standing (with UE support) Hip Flexion/Marching: AROM;Both;10 reps;Standing (with UE support)    General Comments        Pertinent Vitals/Pain Pain Assessment: Faces Faces Pain Scale: Hurts little more Pain Location: low back pain Pain Descriptors / Indicators: Aching Pain Intervention(s): Limited activity within patient's tolerance;Monitored during session;Repositioned    Home Living                      Prior Function            PT Goals (current goals can now be found in the care plan section) Acute Rehab PT Goals Patient Stated Goal: to be able to walk with a cane PT Goal Formulation: With patient Time For Goal Achievement: 03/19/21 Potential to Achieve Goals: Fair Progress towards PT goals: Progressing toward goals    Frequency    Min 1X/week (for maintenance during hospital stay)      PT Plan Current  plan remains appropriate    Co-evaluation              AM-PAC PT "6 Clicks" Mobility   Outcome Measure  Help needed turning from your back to your side while in a flat bed without using bedrails?: None Help needed moving from lying on your back to sitting on the side of a flat bed without using bedrails?: None Help needed moving to and from a bed to a chair (including a wheelchair)?: A Little Help needed standing up from a chair using your arms (e.g., wheelchair or bedside chair)?: A Little Help needed to walk in hospital room?: A Little Help  needed climbing 3-5 steps with a railing? : A Little 6 Click Score: 20    End of Session Equipment Utilized During Treatment: Gait belt Activity Tolerance: Patient tolerated treatment well Patient left: with call bell/phone within reach;in chair (in chair in ED) Nurse Communication: Mobility status PT Visit Diagnosis: Unsteadiness on feet (R26.81)     Time: 8309-4076 PT Time Calculation (min) (ACUTE ONLY): 16 min  Charges:  $Gait Training: 8-22 mins                     [name], DPT  Acute Rehabilitation Services  Pager: [phone] Office: [phone]    [name] 03/13/2021, 4:49 PM

## 2021-03-13 NOTE — ED Notes (Signed)
Pt currently using the phone.

## 2021-03-14 NOTE — ED Notes (Signed)
Pt called [employer] to transport himself home. Pt given his belongings back from the locker and belongings returned from security. Vital signs updated and within normal limits for discharge. Pt given discharge paperwork and stated he understands what to do and where to follow up from here. Pt currently using the restroom and getting dressed. This RN will escort pt to waiting area for discharge once finished.

## 2021-03-15 NOTE — ED Notes (Signed)
Pt called back to get numbers for assisted living facility, [facility].

## 2021-03-16 ENCOUNTER — Telehealth: Payer: Self-pay

## 2021-03-16 ENCOUNTER — Telehealth: Payer: Self-pay | Admitting: Student

## 2021-03-16 NOTE — Telephone Encounter (Signed)
RTC, patient states he was in an assisted living facility before he went into the hospital and he got "fed up with the place".  He is now living in a hotel and would like to try to get back into an assisted living facility.  Informed patient he will need to come in for an appointment to discuss placement and an appt was offered to patient, but he declines appointment at this time.  States his family is kicking him out of the hotel and he is calling the homeless shelters in [place] and [place].  RN highly encouraged patient to schedule f/u to discuss, patient hung up on nurse. SChaplin, RN,BSN

## 2021-03-16 NOTE — Telephone Encounter (Signed)
CSW received call from Care Coordination stating that Pt was requesting call back in regards to assisted living.  CSW called and spoke with Pt.  Pt states that he wanted to go to assisted living.  CSW discussed with Pt the fact that [facility] team had been working to place him in assisted living when he declined placement and stated that he wished to go to [facility].  He has subsequently then decided that he did not wish to go to [facility] and had made alternate plans on his own.  CSW reminded Pt of these interactions. Pt then hung up on CSW.

## 2021-03-16 NOTE — Telephone Encounter (Signed)
Pt requesting a call back about Assisting Living.

## 2021-03-17 IMAGING — DX DG CHEST 2V
2 series · 2 of 2 positions shown · non-contrast
Comparison: December 28, 2020

CLINICAL DATA: Shortness of breath

EXAM:
CHEST - 2 VIEW

[chest pa]
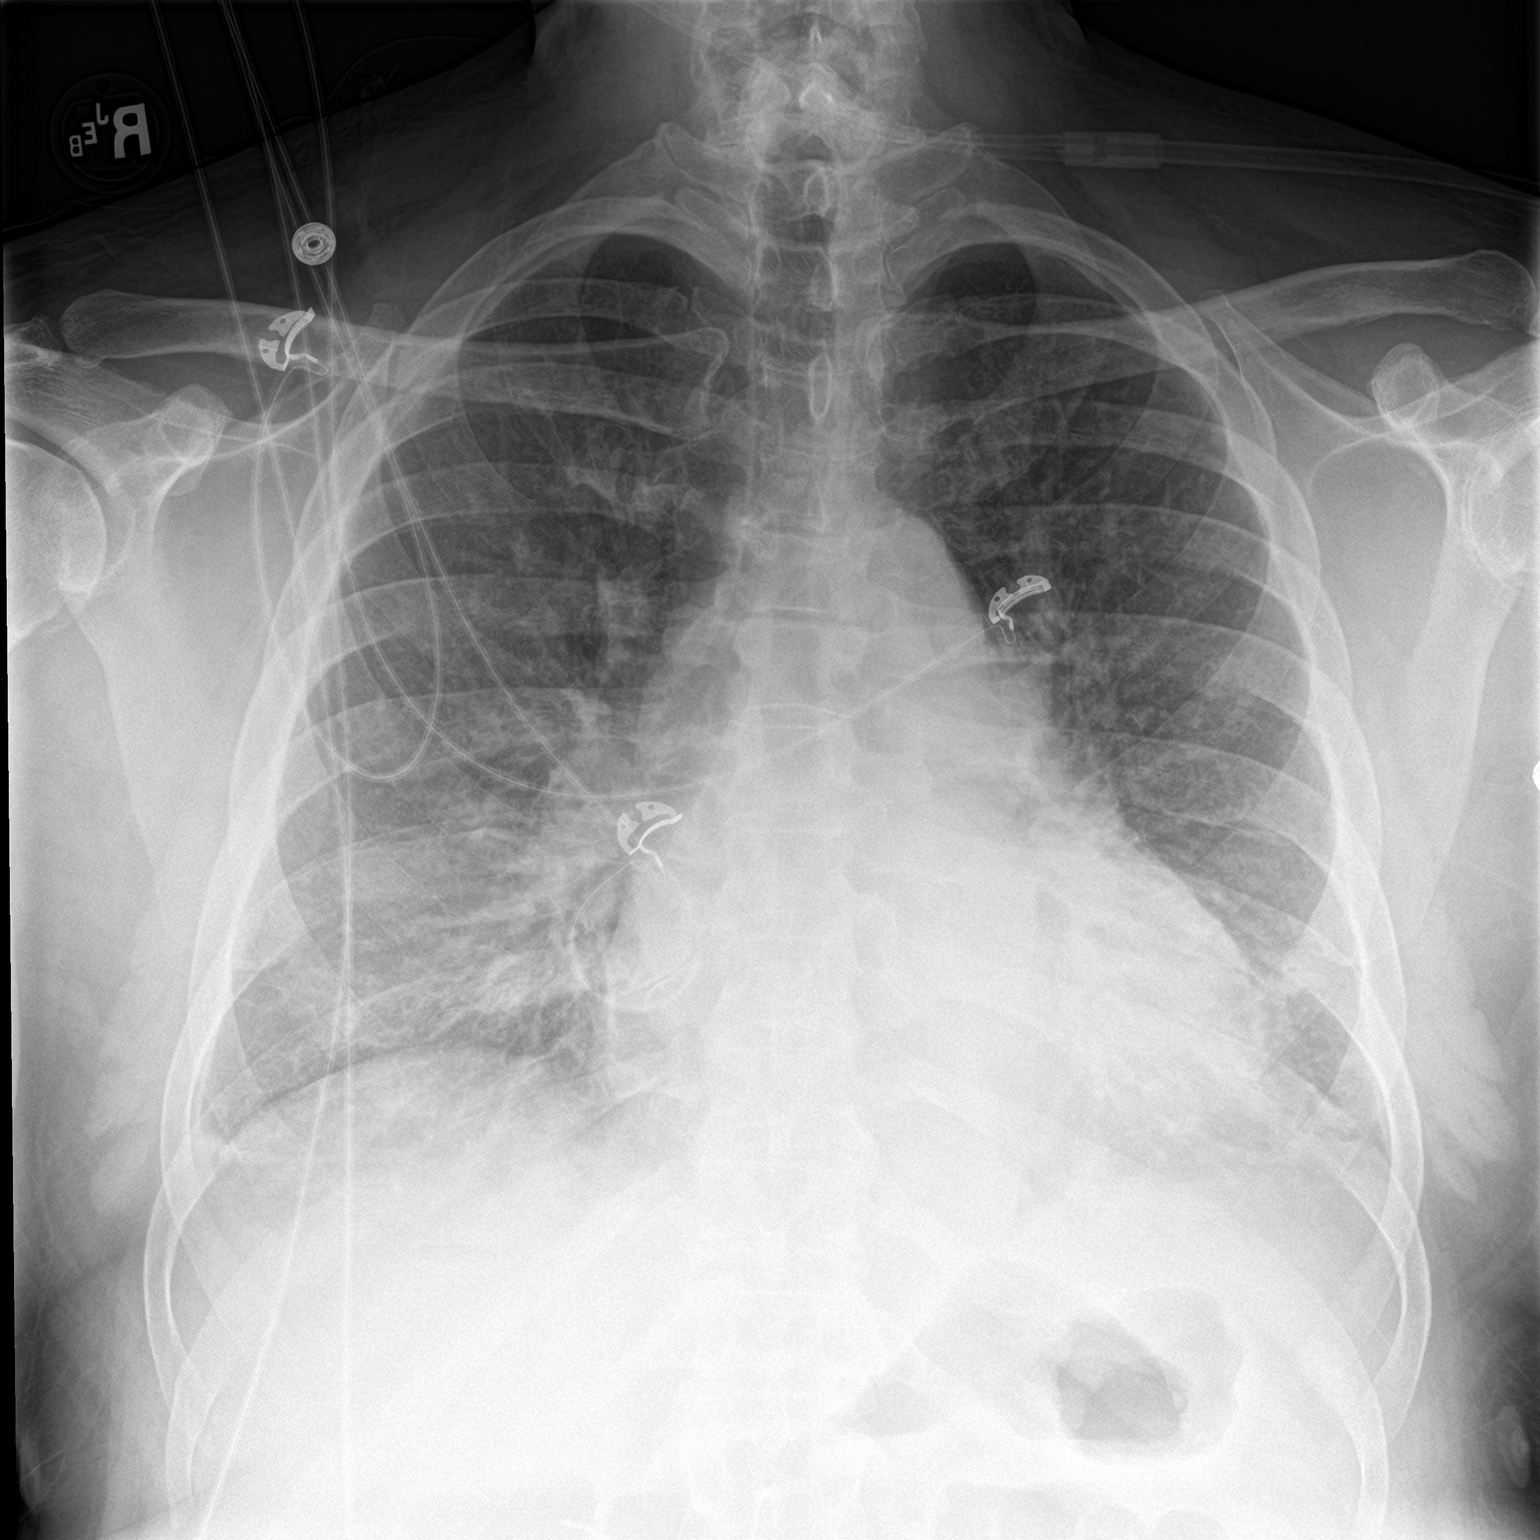

[chest lat]
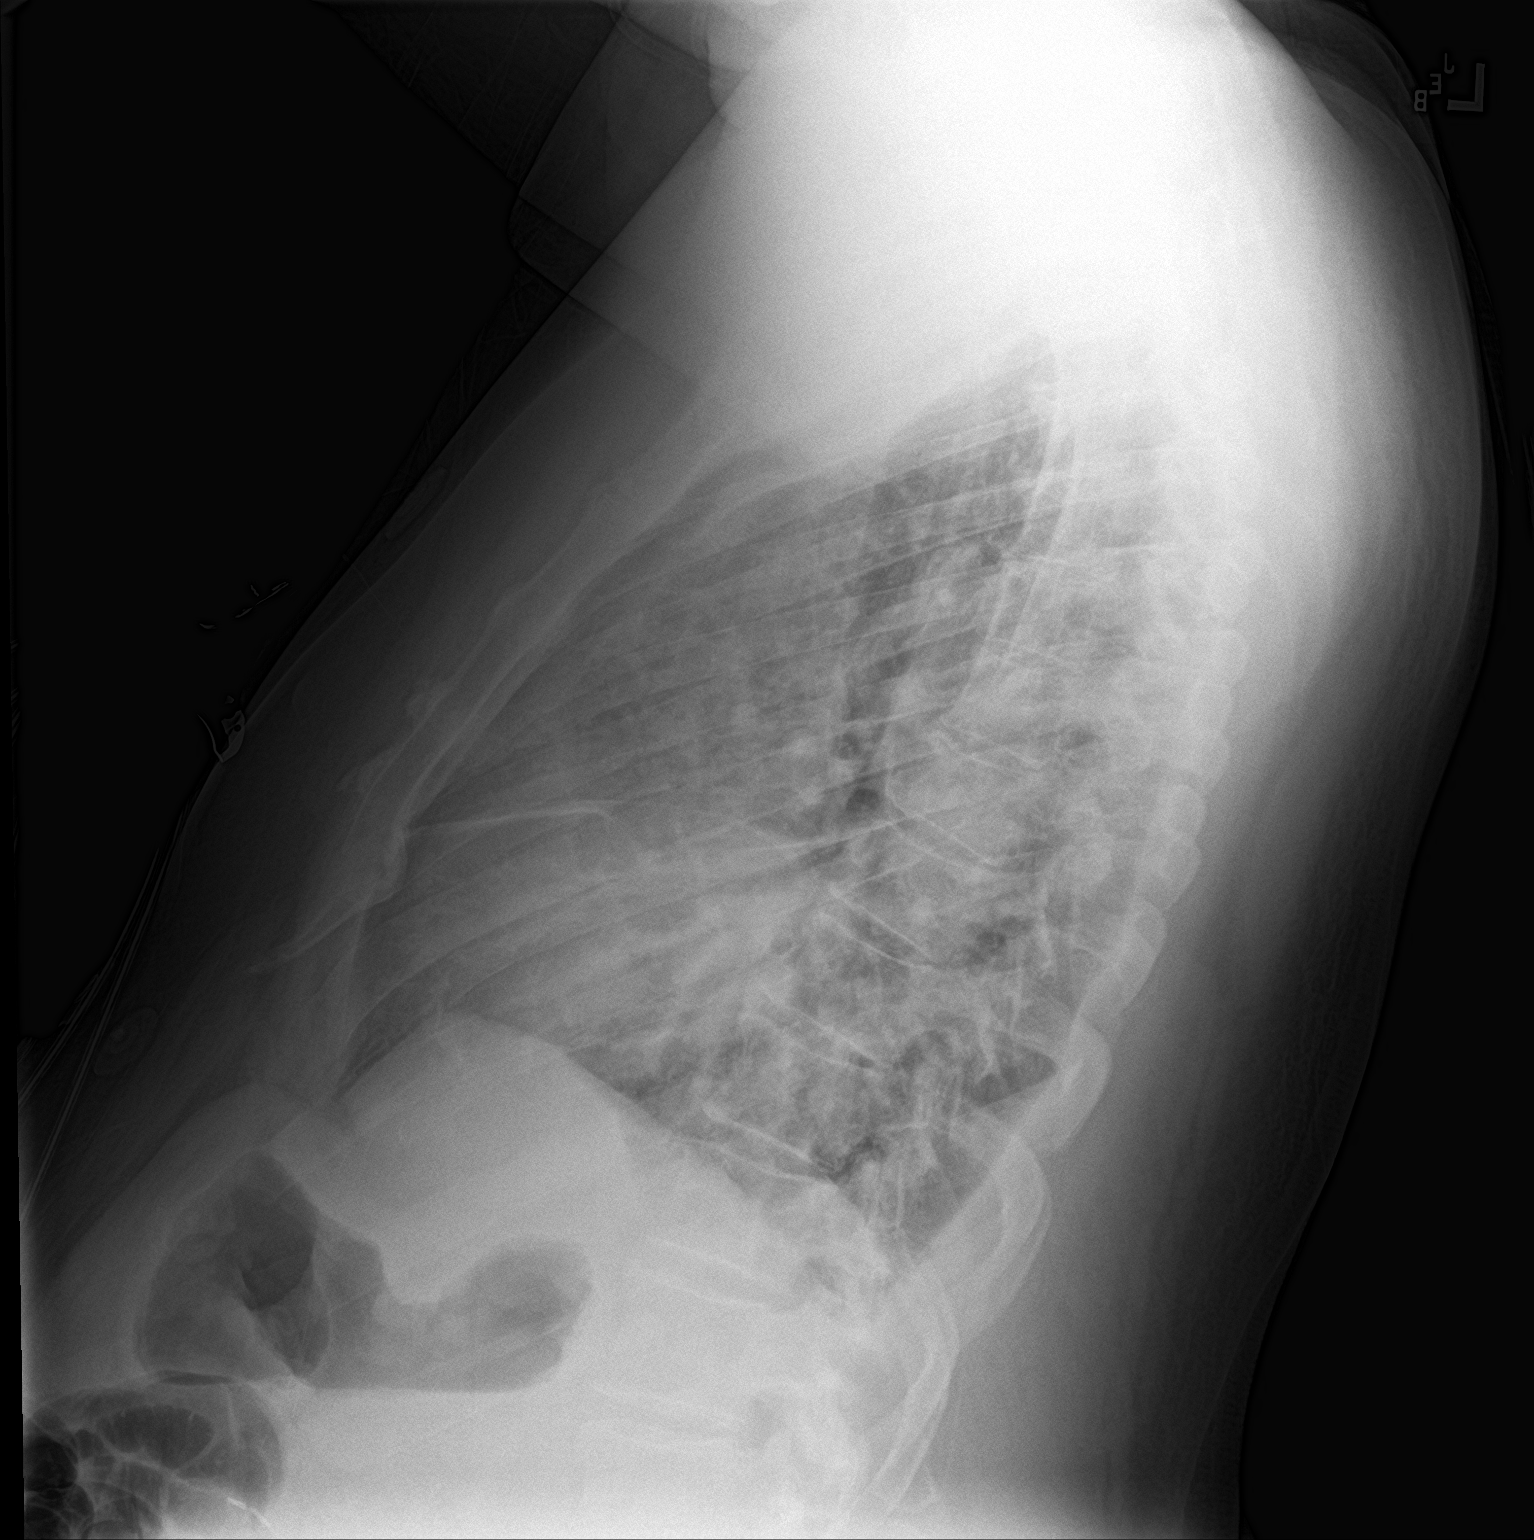

[2 of 2 positions shown; findings below may reference images not displayed]

FINDINGS: The cardiomediastinal silhouette is unchanged and enlarged in
contour.Perihilar vascular congestion. Diffuse interstitial
prominence. Small bilateral pleural effusions. No pneumothorax.
Patchy bibasilar airspace opacities, similar comparison to prior.
Visualized abdomen is unremarkable. Mild degenerative changes of the
thoracic spine.
IMPRESSION: Constellation of findings are favored to reflect pulmonary edema
with scattered areas of atelectasis. Atypical infection could
present similarly.

## 2021-03-18 ENCOUNTER — Emergency Department (HOSPITAL_COMMUNITY): Payer: Medicaid Other

## 2021-03-18 ENCOUNTER — Ambulatory Visit (HOSPITAL_COMMUNITY): Payer: Medicaid Other | Admitting: Physician Assistant

## 2021-03-18 ENCOUNTER — Emergency Department (HOSPITAL_COMMUNITY)
Admission: EM | Admit: 2021-03-18 | Discharge: 2021-03-19 | Disposition: A | Discharge status: Home / Self Care | Payer: Medicaid Other | Attending: Emergency Medicine | Admitting: Emergency Medicine

## 2021-03-18 ENCOUNTER — Encounter (HOSPITAL_COMMUNITY): Payer: Self-pay | Admitting: Student

## 2021-03-18 DIAGNOSIS — Z79899 Other long term (current) drug therapy: Secondary | ICD-10-CM | POA: Insufficient documentation

## 2021-03-18 DIAGNOSIS — Z5900 Homelessness unspecified: Secondary | ICD-10-CM | POA: Diagnosis not present

## 2021-03-18 DIAGNOSIS — Z794 Long term (current) use of insulin: Secondary | ICD-10-CM | POA: Insufficient documentation

## 2021-03-18 DIAGNOSIS — F17210 Nicotine dependence, cigarettes, uncomplicated: Secondary | ICD-10-CM | POA: Diagnosis not present

## 2021-03-18 DIAGNOSIS — Z20822 Contact with and (suspected) exposure to covid-19: Secondary | ICD-10-CM | POA: Diagnosis not present

## 2021-03-18 DIAGNOSIS — F259 Schizoaffective disorder, unspecified: Secondary | ICD-10-CM | POA: Diagnosis not present

## 2021-03-18 DIAGNOSIS — F329 Major depressive disorder, single episode, unspecified: Secondary | ICD-10-CM | POA: Insufficient documentation

## 2021-03-18 DIAGNOSIS — F333 Major depressive disorder, recurrent, severe with psychotic symptoms: Secondary | ICD-10-CM | POA: Diagnosis not present

## 2021-03-18 DIAGNOSIS — R000 Tachycardia, unspecified: Secondary | ICD-10-CM | POA: Insufficient documentation

## 2021-03-18 DIAGNOSIS — E119 Type 2 diabetes mellitus without complications: Secondary | ICD-10-CM | POA: Diagnosis not present

## 2021-03-18 DIAGNOSIS — I509 Heart failure, unspecified: Secondary | ICD-10-CM | POA: Insufficient documentation

## 2021-03-18 DIAGNOSIS — I5033 Acute on chronic diastolic (congestive) heart failure: Secondary | ICD-10-CM | POA: Insufficient documentation

## 2021-03-18 DIAGNOSIS — Z7984 Long term (current) use of oral hypoglycemic drugs: Secondary | ICD-10-CM | POA: Insufficient documentation

## 2021-03-18 DIAGNOSIS — R45851 Suicidal ideations: Secondary | ICD-10-CM | POA: Diagnosis not present

## 2021-03-18 DIAGNOSIS — I110 Hypertensive heart disease with heart failure: Secondary | ICD-10-CM | POA: Insufficient documentation

## 2021-03-18 DIAGNOSIS — Z8616 Personal history of COVID-19: Secondary | ICD-10-CM | POA: Insufficient documentation

## 2021-03-18 DIAGNOSIS — Z644 Discord with counselors: Secondary | ICD-10-CM | POA: Diagnosis not present

## 2021-03-18 DIAGNOSIS — R443 Hallucinations, unspecified: Secondary | ICD-10-CM | POA: Diagnosis present

## 2021-03-18 DIAGNOSIS — F2089 Other schizophrenia: Secondary | ICD-10-CM | POA: Diagnosis not present

## 2021-03-18 DIAGNOSIS — F4323 Adjustment disorder with mixed anxiety and depressed mood: Secondary | ICD-10-CM | POA: Diagnosis not present

## 2021-03-18 LAB — CBC
HCT: 45.4 % (ref 39.0–52.0)
Hemoglobin: 14.5 g/dL (ref 13.0–17.0)
MCH: 25.3 pg — ABNORMAL LOW (ref 26.0–34.0)
MCHC: 31.9 g/dL (ref 30.0–36.0)
MCV: 79.2 fL — ABNORMAL LOW (ref 80.0–100.0)
Platelets: 173 10*3/uL (ref 150–400)
RBC: 5.73 MIL/uL (ref 4.22–5.81)
RDW: 17.7 % — ABNORMAL HIGH (ref 11.5–15.5)
WBC: 7.6 10*3/uL (ref 4.0–10.5)
nRBC: 0 % (ref 0.0–0.2)

## 2021-03-18 NOTE — ED Notes (Signed)
Pt arrived via cab and called the hospital to be wheeled inside. Pt had all of his belongings with him, pt stated "I was rejected from my placement so I came here." Pt was wheeled in and stated "I want to take all my pills and overdose." Pt has multiple belongings. Front desk security stated that it can all go to the security office. When taken to the office the lead security guard stated "that can not come in here it may have bugs, if he is staying we will just throw it away." Security officer then pushed all belongings outside to EMS bay. Charge RN notified of the situation. This EMT is going to go through the belongings and inventory them and try to place in purple.

## 2021-03-18 NOTE — ED Provider Notes (Signed)
  Emergency Medicine Provider in Triage Note   MSE was initiated and I personally evaluated the patient  11:01 PM on Mar 18, 2021 as provider in triage.   Chief Complaint: SI  HPI  [name] a 55 y.o. who presets to the ED with complaints of SI, plan to overdose on pills- no attempt made. Reports auditory hallucinations telling him to harm himself. On further questioning mentions cough & palpitations, missed his medicines this AM.    Review of Systems  Positive: Palpitations, SI, cough, hallucinations Negative: Chest pain, dyspnea, syncope, vomiting  Physical Exam  BP 105/75 (BP Location: Left Arm)   Pulse (!) 122   Temp 98.2 F (36.8 C) (Oral)   Resp (!) 22   SpO2 95%    Gen:   Awake, no distress   HEENT:  Atraumatic  Resp:  Normal effort  Cardiac:  Tachy Abd:   Nondistended, nontender MSK:   Moves extremities without difficulty  Neuro:  Speech clear   Medical Decision Making   Initiation of care has begun. The patient has been counseled on the process, plan, and necessity for staying for the completion/evaluation, informed that the remainder of the evaluation will be completed by another provider, this initial triage assessment does not replace that evaluation, and the importance of remaining in the ED until their evaluation is complete.  Clinical Impression  SI        Amaryllis Dyke, PA-C 03/19/21 0157    Maudie Flakes, MD 03/19/21 [phone]

## 2021-03-18 NOTE — ED Triage Notes (Signed)
Pt reports that he is wants to overdose on all of his pills because there is to much going on, denies HI, reports hearing voices telling him to harm himself

## 2021-03-19 ENCOUNTER — Emergency Department (HOSPITAL_COMMUNITY)
Admission: EM | Admit: 2021-03-19 | Discharge: 2021-03-19 | Disposition: A | Discharge status: Home / Self Care | Payer: Medicaid Other | Source: Home / Self Care | Attending: Emergency Medicine | Admitting: Emergency Medicine

## 2021-03-19 ENCOUNTER — Encounter (HOSPITAL_COMMUNITY): Payer: Self-pay

## 2021-03-19 ENCOUNTER — Emergency Department (HOSPITAL_COMMUNITY)
Admission: EM | Admit: 2021-03-19 | Discharge: 2021-03-20 | Disposition: A | Discharge status: Home / Self Care | Payer: Medicaid Other | Source: Home / Self Care | Attending: Emergency Medicine | Admitting: Emergency Medicine

## 2021-03-19 ENCOUNTER — Encounter (HOSPITAL_COMMUNITY): Payer: Self-pay | Admitting: Emergency Medicine

## 2021-03-19 ENCOUNTER — Other Ambulatory Visit: Payer: Self-pay

## 2021-03-19 DIAGNOSIS — I5033 Acute on chronic diastolic (congestive) heart failure: Secondary | ICD-10-CM | POA: Insufficient documentation

## 2021-03-19 DIAGNOSIS — I10 Essential (primary) hypertension: Secondary | ICD-10-CM | POA: Diagnosis present

## 2021-03-19 DIAGNOSIS — Z79899 Other long term (current) drug therapy: Secondary | ICD-10-CM | POA: Insufficient documentation

## 2021-03-19 DIAGNOSIS — Z794 Long term (current) use of insulin: Secondary | ICD-10-CM | POA: Insufficient documentation

## 2021-03-19 DIAGNOSIS — Z8616 Personal history of COVID-19: Secondary | ICD-10-CM | POA: Insufficient documentation

## 2021-03-19 DIAGNOSIS — F209 Schizophrenia, unspecified: Secondary | ICD-10-CM | POA: Diagnosis present

## 2021-03-19 DIAGNOSIS — F32A Depression, unspecified: Secondary | ICD-10-CM

## 2021-03-19 DIAGNOSIS — F4323 Adjustment disorder with mixed anxiety and depressed mood: Secondary | ICD-10-CM

## 2021-03-19 DIAGNOSIS — E119 Type 2 diabetes mellitus without complications: Secondary | ICD-10-CM | POA: Insufficient documentation

## 2021-03-19 DIAGNOSIS — Z5900 Homelessness unspecified: Secondary | ICD-10-CM

## 2021-03-19 DIAGNOSIS — F17210 Nicotine dependence, cigarettes, uncomplicated: Secondary | ICD-10-CM | POA: Insufficient documentation

## 2021-03-19 DIAGNOSIS — F259 Schizoaffective disorder, unspecified: Secondary | ICD-10-CM | POA: Insufficient documentation

## 2021-03-19 DIAGNOSIS — F329 Major depressive disorder, single episode, unspecified: Secondary | ICD-10-CM

## 2021-03-19 DIAGNOSIS — I110 Hypertensive heart disease with heart failure: Secondary | ICD-10-CM | POA: Insufficient documentation

## 2021-03-19 DIAGNOSIS — R45851 Suicidal ideations: Secondary | ICD-10-CM

## 2021-03-19 DIAGNOSIS — Z644 Discord with counselors: Secondary | ICD-10-CM | POA: Insufficient documentation

## 2021-03-19 DIAGNOSIS — C8190 Hodgkin lymphoma, unspecified, unspecified site: Secondary | ICD-10-CM | POA: Diagnosis present

## 2021-03-19 LAB — RESP PANEL BY RT-PCR (FLU A&B, COVID) ARPGX2
Influenza A by PCR: NEGATIVE
Influenza B by PCR: NEGATIVE
SARS Coronavirus 2 by RT PCR: NEGATIVE

## 2021-03-19 LAB — COMPREHENSIVE METABOLIC PANEL
ALT: 39 U/L (ref 0–44)
AST: 28 U/L (ref 15–41)
Albumin: 3.7 g/dL (ref 3.5–5.0)
Alkaline Phosphatase: 92 U/L (ref 38–126)
Anion gap: 8 (ref 5–15)
BUN: 18 mg/dL (ref 6–20)
CO2: 22 mmol/L (ref 22–32)
Calcium: 9.4 mg/dL (ref 8.9–10.3)
Chloride: 106 mmol/L (ref 98–111)
Creatinine, Ser: 1.31 mg/dL — ABNORMAL HIGH (ref 0.61–1.24)
GFR, Estimated: >60 mL/min (ref >60)
Glucose, Bld: 119 mg/dL — ABNORMAL HIGH (ref 70–99)
Potassium: 4.3 mmol/L (ref 3.5–5.1)
Sodium: 136 mmol/L (ref 135–145)
Total Bilirubin: 1 mg/dL (ref 0.3–1.2)
Total Protein: 7.1 g/dL (ref 6.5–8.1)

## 2021-03-19 LAB — RAPID URINE DRUG SCREEN, HOSP PERFORMED
Amphetamines: NOT DETECTED
Barbiturates: NOT DETECTED
Benzodiazepines: NOT DETECTED
Cocaine: NOT DETECTED
Opiates: NOT DETECTED
Tetrahydrocannabinol: NOT DETECTED

## 2021-03-19 LAB — ACETAMINOPHEN LEVEL: Acetaminophen (Tylenol), Serum: <10 ug/mL — ABNORMAL LOW (ref 10–30)

## 2021-03-19 LAB — ETHANOL: Alcohol, Ethyl (B): <10 mg/dL (ref <10)

## 2021-03-19 LAB — SALICYLATE LEVEL: Salicylate Lvl: <7 mg/dL — ABNORMAL LOW (ref 7.0–30.0)

## 2021-03-19 MED ORDER — POLYETHYLENE GLYCOL 3350 17 G PO PACK: 17.0000 g | PACK | Freq: Two times a day (BID) | ORAL | Status: DC | PRN

## 2021-03-19 MED ORDER — BRIMONIDINE TARTRATE 0.2 % OP SOLN: 1.0000 [drp] | Freq: Two times a day (BID) | OPHTHALMIC | Status: DC

## 2021-03-19 MED ORDER — SACUBITRIL-VALSARTAN 24-26 MG PO TABS
1.0000 | ORAL_TABLET | Freq: Two times a day (BID) | ORAL | Status: DC
  Administered 2021-03-19: 1 via ORAL
  Filled 2021-03-19 (×2): qty 1

## 2021-03-19 MED ORDER — PANTOPRAZOLE SODIUM 40 MG PO TBEC
40.0000 mg | DELAYED_RELEASE_TABLET | Freq: Every day | ORAL | Status: DC
  Administered 2021-03-19: 40 mg via ORAL
  Filled 2021-03-19: qty 1

## 2021-03-19 MED ORDER — RISPERIDONE 0.5 MG PO TABS
0.5000 mg | ORAL_TABLET | Freq: Every day | ORAL | Status: DC
  Filled 2021-03-19: qty 1

## 2021-03-19 MED ORDER — DIGOXIN 125 MCG PO TABS
125.0000 ug | ORAL_TABLET | Freq: Every day | ORAL | Status: DC
  Administered 2021-03-19: 125 ug via ORAL
  Filled 2021-03-19: qty 1

## 2021-03-19 MED ORDER — METOPROLOL SUCCINATE ER 25 MG PO TB24
75.0000 mg | ORAL_TABLET | Freq: Every day | ORAL | Status: DC
  Administered 2021-03-19: 75 mg via ORAL
  Filled 2021-03-19: qty 3

## 2021-03-19 MED ORDER — ACETAMINOPHEN 325 MG PO TABS: 650.0000 mg | ORAL_TABLET | ORAL | Status: DC | PRN

## 2021-03-19 MED ORDER — MELATONIN 3 MG PO TABS
3.0000 mg | ORAL_TABLET | Freq: Every day | ORAL | Status: DC
  Filled 2021-03-19: qty 1

## 2021-03-19 MED ORDER — DAPAGLIFLOZIN PROPANEDIOL 10 MG PO TABS
10.0000 mg | ORAL_TABLET | Freq: Every day | ORAL | Status: DC
  Administered 2021-03-19: 10 mg via ORAL
  Filled 2021-03-19: qty 1

## 2021-03-19 MED ORDER — TAMSULOSIN HCL 0.4 MG PO CAPS
0.4000 mg | ORAL_CAPSULE | Freq: Every morning | ORAL | Status: DC
  Administered 2021-03-19: 0.4 mg via ORAL
  Filled 2021-03-19: qty 1

## 2021-03-19 MED ORDER — SENNA 8.6 MG PO TABS
2.0000 | ORAL_TABLET | Freq: Two times a day (BID) | ORAL | Status: DC
  Administered 2021-03-19: 17.2 mg via ORAL
  Filled 2021-03-19: qty 2

## 2021-03-19 MED ORDER — SPIRONOLACTONE 12.5 MG HALF TABLET
12.5000 mg | ORAL_TABLET | Freq: Every day | ORAL | Status: DC
  Administered 2021-03-19: 12.5 mg via ORAL
  Filled 2021-03-19: qty 1

## 2021-03-19 MED ORDER — LEVOTHYROXINE SODIUM 100 MCG PO TABS
100.0000 ug | ORAL_TABLET | Freq: Every day | ORAL | Status: DC
  Administered 2021-03-19: 100 ug via ORAL
  Filled 2021-03-19: qty 1

## 2021-03-19 MED ORDER — ATORVASTATIN CALCIUM 10 MG PO TABS
10.0000 mg | ORAL_TABLET | Freq: Every day | ORAL | Status: DC
  Administered 2021-03-19: 10 mg via ORAL
  Filled 2021-03-19: qty 1

## 2021-03-19 MED ORDER — FUROSEMIDE 20 MG PO TABS
80.0000 mg | ORAL_TABLET | Freq: Every day | ORAL | Status: DC
  Administered 2021-03-19: 80 mg via ORAL
  Filled 2021-03-19: qty 4

## 2021-03-19 MED ORDER — ONDANSETRON HCL 4 MG PO TABS: 4.0000 mg | ORAL_TABLET | Freq: Three times a day (TID) | ORAL | Status: DC | PRN

## 2021-03-19 NOTE — Progress Notes (Signed)
..   Transition of Care [facility]) - Emergency Department Mini Assessment   Patient Details  Name: Charles [name] MRN: [id number] Date of Birth: 05/21/1966  Transition of Care [facility]) CM/SW Contact:    Arash Karstens C Tarpley-Carter, [place] Phone Number: 03/19/2021, 4:40 PM   Clinical Narrative: [facility] CSW consulted with pt.  Pt is requesting to go to [facility].  CSW will contact [facility] for assistance with admissions.   Krystalynn Ridgeway Tarpley-Carter, MSW, LCSW-A Pronouns:  She, Her, Hers                  [facility] Transitions of CareClinical Social Worker [email or web address] [phone]  ED Mini Assessment: What brought you to the Emergency Department? : Suicidal Ideation  Barriers to Discharge: No Barriers Identified     Means of departure: Not know  Interventions which prevented an admission or readmission: SNF Placement    Patient Contact and Communications       Contact Date: 03/19/21,          Patient states their goals for this hospitalization and ongoing recovery are:: Pt wants to go to [facility].   Choice offered to / list presented to : NA  Admission diagnosis:  SI Patient Active Problem List   Diagnosis Date Noted  . MDD (major depressive disorder), recurrent, severe, with psychosis ([place]) 02/28/2021  . Acute hypoxemic respiratory failure ([place]) 01/13/2021  . Acute on chronic heart failure with reduced ejection fraction and diastolic dysfunction ([place]) 01/12/2021  . Schizophrenia ([place]) 01/04/2021  . Diarrhea 01/04/2021  . Pulmonary edema   . Mediastinal adenopathy 12/18/2020  . Adjustment disorder with mixed anxiety and depressed mood 10/30/2020  . Mediastinal lymphadenopathy 10/11/2020  . Shortness of breath 10/11/2020  . Sinus tachycardia 10/11/2020  . Night sweats 10/11/2020  . DM2 (diabetes mellitus, type 2) ([place]) 10/11/2020  . 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 Hypertension 09/25/2020  . BPH (benign prostatic hyperplasia) 09/25/2020  . Insomnia 09/25/2020   PCP:  [name], MD Pharmacy:   [facility] [place] [place] [address] [place] [address] Phone: [phone] Fax: [phone]

## 2021-03-19 NOTE — ED Triage Notes (Signed)
The patient was discharged earlier today and presents again with SI. He reports being told to come back by a behavioral response team member.

## 2021-03-19 NOTE — ED Notes (Signed)
Pt asked this NT if there was any mental hospital that he could be placed in. Pt stated that he doesn't want to leave the hospital and if he does leave the hospital he "will take all of his pills and will overdose". RN notified.

## 2021-03-19 NOTE — ED Notes (Signed)
Pt has his belongings. Remains on the phone trying to locate somewhere to allow him to reside.

## 2021-03-19 NOTE — BH Assessment (Signed)
Disposition Charles Romp, NP, recommends overnight observation for safety and stabilization with psych reassessment in the AM. Patient will remain at [facility] due to fall risk. Dr. Sedonia Small, Kennith Maes, [place] and [place], [place], informed of disposition via secure chat.

## 2021-03-19 NOTE — ED Notes (Signed)
Pt temporarily moved into Room 22 to speak with TTS.

## 2021-03-19 NOTE — ED Provider Notes (Addendum)
[name] Provider Note   CSN: [id number] Arrival date & time: 03/19/21  2102     History Chief Complaint  Patient presents with  . Suicidal    Charles [name] is a 55 y.o. male presents to the Emergency Department complaining of gradual, persistent, progressively worsening suicidal ideation which is chronic for patient.  Patient reports that his plan tonight was to sell his TV at a pond shop and buy pills for an overdose.  This has been patient's plan in the past.  Patient has been seen numerous times in the last 2 days for suicidal ideation.  Records reviewed.  Evaluated by TTS around 5 AM this morning and by [facility] FNP Beatriz Stallion at 8:41 AM.  At that time they felt patient's concerns were more related to his housing situation and not his suicidal ideation.  Patient was discharged around 2 AM.  Patient returned to the emergency department around 315 and was evaluated by attending physician who that time did not feel that patient needed another TTS assessment.  Numerous hours were spent with social work attempting to solve housing problems however patient unwilling to accept any of the offers noted.  Patient reports after leaving the emergency department he was evaluated by behavioral health counselor -see business card photo.  This individual recommended patient return to the emergency department for additional evaluation.  Patient denies any physical symptoms at this time.  Reports suicidal ideation and plan for overdose is unchanged from previous.  No specific aggravating or alleviating factors.      The history is provided by the patient and medical records. No language interpreter was used.       Past Medical History:  Diagnosis Date  . Arthritis    lower back  . Bipolar 1 disorder ([place])   . Cardiomyopathy, nonischemic ([place])    no CAD 10/14/20 cath, EF 20-25% by 10/11/20 echo  . CHF (congestive heart failure) ([place])   . COVID 10/2020  . Depression    . DM2 (diabetes mellitus, type 2) ([place])   . HTN (hypertension)   . Myocardial infarction [facility])     Patient Active Problem List   Diagnosis Date Noted  . MDD (major depressive disorder), recurrent, severe, with psychosis ([place]) 02/28/2021  . Acute hypoxemic respiratory failure ([place]) 01/13/2021  . Acute on chronic heart failure with reduced ejection fraction and diastolic dysfunction ([place]) 01/12/2021  . Schizophrenia ([place]) 01/04/2021  . Diarrhea 01/04/2021  . Pulmonary edema   . Mediastinal adenopathy 12/18/2020  . Adjustment disorder with mixed anxiety and depressed mood 10/30/2020  . Mediastinal lymphadenopathy 10/11/2020  . Shortness of breath 10/11/2020  . Sinus tachycardia 10/11/2020  . Night sweats 10/11/2020  . DM2 (diabetes mellitus, type 2) ([place]) 10/11/2020  . 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 Hypertension 09/25/2020  . BPH (benign prostatic hyperplasia) 09/25/2020  . Insomnia 09/25/2020    Past Surgical History:  Procedure Laterality Date  . BACK SURGERY  01/2020  . CARDIAC CATHETERIZATION  10/2020  . CHOLECYSTECTOMY    . HERNIA REPAIR    . INTERCOSTAL NERVE BLOCK Left 12/18/2020   Procedure: INTERCOSTAL NERVE BLOCK;  Surgeon: Melrose Nakayama, MD;  Location: [facility];  Service: Thoracic;  Laterality: Left;  . LYMPH NODE BIOPSY Left 12/18/2020   Procedure: LYMPH NODE BIOPSY;  Surgeon: Melrose Nakayama, MD;  Location: [facility];  Service: Thoracic;  Laterality: Left;  . RIGHT/LEFT HEART CATH AND CORONARY  ANGIOGRAPHY N/A 10/14/2020   Procedure: RIGHT/LEFT HEART CATH AND CORONARY ANGIOGRAPHY;  Surgeon: Jettie Booze, MD;  Location: [facility] CV LAB;  Service: Cardiovascular;  Laterality: N/A;  . SPINAL FIXATION SURGERY  02/26/2020  . XI ROBOTIC ASSISTED THORASCOPY FOR BIOPSY AP WINDOW LYMPH NODES (Left)  12/18/2020       Family History  Problem Relation Age of Onset  . Hypertension Mother   .  Hyperlipidemia Mother   . Stroke Mother   . Cancer Mother   . Hypertension Father   . Hyperlipidemia Father   . Healthy Sister     Social History   Tobacco Use  . Smoking status: Current Some Day Smoker    Packs/day: 0.25    Types: Cigarettes  . Smokeless tobacco: Never Used  . Tobacco comment: occasionally  Vaping Use  . Vaping Use: Never used  Substance Use Topics  . Alcohol use: Never  . Drug use: Not Currently    Types: Cocaine    Home Medications Prior to Admission medications   Medication Sig Start Date End Date Taking? Authorizing Provider  acetaminophen (TYLENOL) 325 MG tablet Take 650 mg by mouth every 6 (six) hours as needed for moderate pain.    [provider]  atorvastatin (LIPITOR) 10 MG tablet Take 10 mg by mouth daily. 09/30/20   [provider]  blood glucose meter kit and supplies KIT Dispense based on patient and insurance preference. Use up to four times daily as directed. (FOR ICD-9 250.00, 250.01). 10/23/20   Barb Merino, MD  brimonidine (ALPHAGAN) 0.2 % ophthalmic solution Place 1 drop into both eyes 2 (two) times daily.     [provider]  dapagliflozin propanediol (FARXIGA) 10 MG TABS tablet Take 1 tablet (10 mg total) by mouth daily. 10/23/20   Barb Merino, MD  digoxin (LANOXIN) 0.125 MG tablet Take 1 tablet (125 mcg total) by mouth daily. Patient taking differently: Take 0.125 mg by mouth daily. 12/29/20 12/29/21  Clegg, Amy D, NP  furosemide (LASIX) 80 MG tablet TAKE 1 TABLET (80 MG TOTAL) BY MOUTH TWO TIMES DAILY. Patient taking differently: Take 80 mg by mouth daily. 12/23/20 12/23/21  Gold, Wilder Glade, PA-C  Insulin Pen Needle (PEN NEEDLES 29GX1/2") 29G X 12MM MISC 1 each by Does not apply route daily. 10/23/20   Barb Merino, MD  Insulin Pen Needle 32G X 4 MM MISC USE AS DIRECTED. 10/23/20 10/23/21  Barb Merino, MD  levothyroxine (SYNTHROID) 100 MCG tablet Take 100 mcg by mouth daily before breakfast.    [provider]  melatonin 3 MG TABS tablet Take 3 mg by mouth at bedtime. 09/08/20   [provider]  metoprolol succinate (TOPROL-XL) 25 MG 24 hr tablet TAKE 3 TABLETS (75 MG TOTAL) BY MOUTH DAILY. Patient taking differently: Take 75 mg by mouth daily. 01/16/21 01/16/22  [name], MD  pantoprazole (PROTONIX) 40 MG tablet Take 40 mg by mouth daily before breakfast.    [provider]  polyethylene glycol (MIRALAX / GLYCOLAX) 17 g packet Take 17 g by mouth 2 (two) times daily. 10/23/20   Barb Merino, MD  polyethylene glycol powder (GLYCOLAX/MIRALAX) 17 GM/SCOOP powder TAKE 17 G BY MOUTH 2 (TWO) TIMES DAILY. Patient taking differently: Take 17 g by mouth in the morning and at bedtime. 10/23/20 10/23/21  Barb Merino, MD  potassium chloride SA (KLOR-CON) 20 MEQ tablet TAKE 2 TABLETS (40 MEQ TOTAL) BY MOUTH DAILY. Patient taking differently: Take 40 mEq by mouth daily.  12/23/20 12/23/21  Jadene Pierini E, PA-C  risperiDONE (RISPERDAL) 0.5 MG tablet Take 1 tablet (0.5 mg total) by mouth at bedtime. 12/14/20   Lucrezia Starch, MD  sacubitril-valsartan (ENTRESTO) 24-26 MG TAKE 1 TABLET BY MOUTH TWO TIMES DAILY. 10/23/20 10/23/21  Barb Merino, MD  senna (SENOKOT) 8.6 MG TABS tablet Take 2 tablets (17.2 mg total) by mouth 2 (two) times daily. 10/23/20   Barb Merino, MD  spironolactone (ALDACTONE) 25 MG tablet Take 0.5 tablets (12.5 mg total) by mouth daily. 01/07/21   Madalyn Rob, MD  tamsulosin (FLOMAX) 0.4 MG CAPS capsule Take 0.4 mg by mouth in the morning. 09/08/20   [provider]  traMADol (ULTRAM) 50 MG tablet TAKE 1-2 TABLETS (50-100 MG TOTAL) BY MOUTH EVERY TWELVE HOURS AS NEEDED (MILD PAIN). Patient taking differently: Take 50-100 mg by mouth every 12 (twelve) hours as needed for moderate pain. 12/23/20 07/05/2021  John Giovanni, PA-C    Allergies    Ibuprofen and Penicillins  Review of Systems   Review of Systems  Constitutional: Negative for appetite change,  diaphoresis, fatigue, fever and unexpected weight change.  HENT: Negative for mouth sores.   Eyes: Negative for visual disturbance.  Respiratory: Positive for cough. Negative for chest tightness, shortness of breath and wheezing.   Cardiovascular: Negative for chest pain.  Gastrointestinal: Negative for abdominal pain, constipation, diarrhea, nausea and vomiting.  Endocrine: Negative for polydipsia, polyphagia and polyuria.  Genitourinary: Negative for dysuria, frequency, hematuria and urgency.  Musculoskeletal: Negative for back pain and neck stiffness.  Skin: Negative for rash.  Allergic/Immunologic: Negative for immunocompromised state.  Neurological: Negative for syncope, light-headedness and headaches.  Hematological: Does not bruise/bleed easily.  Psychiatric/Behavioral: Positive for suicidal ideas. Negative for sleep disturbance. The patient is not nervous/anxious.     Physical Exam Updated Vital Signs BP 107/64 (BP Location: Left Arm)   Pulse (!) 117   Temp 97.9 F (36.6 C) (Oral)   Resp 19   SpO2 97%   Physical Exam Vitals and nursing note reviewed.  Constitutional:      General: He is not in acute distress.    Appearance: He is well-developed.  HENT:     Head: Normocephalic.     Mouth/Throat:     Mouth: Mucous membranes are moist.  Eyes:     General: No scleral icterus.    Conjunctiva/sclera: Conjunctivae normal.  Cardiovascular:     Rate and Rhythm: Tachycardia present.     Pulses:          Radial pulses are 2+ on the right side and 2+ on the left side.  Pulmonary:     Effort: Pulmonary effort is normal.     Breath sounds: Normal breath sounds.     Comments: Dry cough Abdominal:     General: There is no distension.     Palpations: Abdomen is soft.     Tenderness: There is no abdominal tenderness.  Musculoskeletal:        General: Normal range of motion.     Cervical back: Normal range of motion.  Skin:    General: Skin is warm and dry.     Capillary  Refill: Capillary refill takes less than 2 seconds.  Neurological:     General: No focal deficit present.     Mental Status: He is alert.  Psychiatric:        Thought Content: Thought content includes suicidal ideation. Thought content includes suicidal plan.     ED Results /  Procedures / Treatments    Procedures Procedures   Medications Ordered in ED Medications - No data to display  ED Course  I have reviewed the triage vital signs and the nursing notes.  Pertinent labs & imaging results that were available during my care of the patient were reviewed by me and considered in my medical decision making (see chart for details).    MDM Rules/Calculators/A&P                           Records reviewed.  He has had extensive psychiatric evaluation over the last 24 hours and was previously cleared by psychiatry.  Consider discharge however as patient was referred back here by behavioral health counselor will have TTS reevaluate.  Anticipate discharge.  No labs gathered at this time.  4:03 AM Patient continues to wait for TTS evaluation.  6:43 AM  NP, Darrol Angel recommended overnight observation and reassessment by Psyc this AM.    Final Clinical Impression(s) / ED Diagnoses Final diagnoses:  Suicidal ideation    Rx / DC Orders ED Discharge Orders    None       Efren Kross, Gwenlyn Perking 03/20/21 0403    Rolin Schult, Jarrett Soho, PA-C 03/20/21 8032    Ripley Fraise, MD 03/20/21 [phone]

## 2021-03-19 NOTE — ED Notes (Signed)
Pt moved back to [facility] after concluding TTS.

## 2021-03-19 NOTE — Progress Notes (Addendum)
CSW contacted [facility] and was told they were not sure if admissions was going to accept him from the previous assessment he had on 03/10/2021.

## 2021-03-19 NOTE — ED Provider Notes (Addendum)
[facility] DEPT Provider Note   CSN: [id number] Arrival date & time: 03/19/21  1414     History Chief Complaint  Patient presents with  . Suicidal    Charles [name] is a 55 y.o. male.  HPI Patient presents same day as being discharged from our affiliated facility, now with concern for his ongoing suicidal ideation, complicated by his lack of housing. Patient has recent hospitalization with ongoing chemotherapy, this was completed about 1 week ago, and he was in our facility for about 2 weeks awaiting placement.  History is obtained by the patient and chart review.  According the patient he had some discord when trying to move to a transitional care facility.  According to chart review the patient was referred to a group home, and had disagreement about using his supplemental income to pay for it.  Today patient notes that since leaving our other facility less than 5 years ago he has persistent suicidal ideation, plans to take excessive amounts of tablets.  He perseverates on this, seemingly denies physical pain, however.     Past Medical History:  Diagnosis Date  . Arthritis    lower back  . Bipolar 1 disorder ([place])   . Cardiomyopathy, nonischemic ([place])    no CAD 10/14/20 cath, EF 20-25% by 10/11/20 echo  . CHF (congestive heart failure) ([place])   . COVID 10/2020  . Depression   . DM2 (diabetes mellitus, type 2) ([place])   . HTN (hypertension)   . Myocardial infarction [facility])     Patient Active Problem List   Diagnosis Date Noted  . MDD (major depressive disorder), recurrent, severe, with psychosis ([place]) 02/28/2021  . Acute hypoxemic respiratory failure ([place]) 01/13/2021  . Acute on chronic heart failure with reduced ejection fraction and diastolic dysfunction ([place]) 01/12/2021  . Schizophrenia ([place]) 01/04/2021  . Diarrhea 01/04/2021  . Pulmonary edema   . Mediastinal adenopathy 12/18/2020  . Adjustment disorder with mixed anxiety and depressed mood  10/30/2020  . Mediastinal lymphadenopathy 10/11/2020  . Shortness of breath 10/11/2020  . Sinus tachycardia 10/11/2020  . Night sweats 10/11/2020  . DM2 (diabetes mellitus, type 2) ([place]) 10/11/2020  . 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 Hypertension 09/25/2020  . BPH (benign prostatic hyperplasia) 09/25/2020  . Insomnia 09/25/2020    Past Surgical History:  Procedure Laterality Date  . BACK SURGERY  01/2020  . CARDIAC CATHETERIZATION  10/2020  . CHOLECYSTECTOMY    . HERNIA REPAIR    . INTERCOSTAL NERVE BLOCK Left 12/18/2020   Procedure: INTERCOSTAL NERVE BLOCK;  Surgeon: Melrose Nakayama, MD;  Location: [facility];  Service: Thoracic;  Laterality: Left;  . LYMPH NODE BIOPSY Left 12/18/2020   Procedure: LYMPH NODE BIOPSY;  Surgeon: Melrose Nakayama, MD;  Location: [facility];  Service: Thoracic;  Laterality: Left;  . RIGHT/LEFT HEART CATH AND CORONARY ANGIOGRAPHY N/A 10/14/2020   Procedure: RIGHT/LEFT HEART CATH AND CORONARY ANGIOGRAPHY;  Surgeon: Jettie Booze, MD;  Location: [facility];  Service: Cardiovascular;  Laterality: N/A;  . SPINAL FIXATION SURGERY  02/26/2020  . XI ROBOTIC ASSISTED THORASCOPY FOR BIOPSY AP WINDOW LYMPH NODES (Left)  12/18/2020       Family History  Problem Relation Age of Onset  . Hypertension Mother   . Hyperlipidemia Mother   . Stroke Mother   . Cancer Mother   . Hypertension Father   . Hyperlipidemia Father   . Healthy Sister  Social History   Tobacco Use  . Smoking status: Current Some Day Smoker    Packs/day: 0.25    Types: Cigarettes  . Smokeless tobacco: Never Used  . Tobacco comment: occasionally  Vaping Use  . Vaping Use: Never used  Substance Use Topics  . Alcohol use: Never  . Drug use: Not Currently    Types: Cocaine    Home Medications Prior to Admission medications   Medication Sig Start Date End Date Taking? Authorizing Provider   acetaminophen (TYLENOL) 325 MG tablet Take 650 mg by mouth every 6 (six) hours as needed for moderate pain.    [provider]  atorvastatin (LIPITOR) 10 MG tablet Take 10 mg by mouth daily. 09/30/20   [provider]  blood glucose meter kit and supplies KIT Dispense based on patient and insurance preference. Use up to four times daily as directed. (FOR ICD-9 250.00, 250.01). 10/23/20   Barb Merino, MD  brimonidine (ALPHAGAN) 0.2 % ophthalmic solution Place 1 drop into both eyes 2 (two) times daily.     [provider]  dapagliflozin propanediol (FARXIGA) 10 MG TABS tablet Take 1 tablet (10 mg total) by mouth daily. 10/23/20   Barb Merino, MD  digoxin (LANOXIN) 0.125 MG tablet Take 1 tablet (125 mcg total) by mouth daily. Patient taking differently: Take 0.125 mg by mouth daily. 12/29/20 12/29/21  Clegg, Amy D, NP  furosemide (LASIX) 80 MG tablet TAKE 1 TABLET (80 MG TOTAL) BY MOUTH TWO TIMES DAILY. Patient taking differently: Take 80 mg by mouth daily. 12/23/20 12/23/21  Gold, Wilder Glade, PA-C  Insulin Pen Needle (PEN NEEDLES 29GX1/2") 29G X 12MM MISC 1 each by Does not apply route daily. 10/23/20   Barb Merino, MD  Insulin Pen Needle 32G X 4 MM MISC USE AS DIRECTED. 10/23/20 10/23/21  Barb Merino, MD  levothyroxine (SYNTHROID) 100 MCG tablet Take 100 mcg by mouth daily before breakfast.    [provider]  melatonin 3 MG TABS tablet Take 3 mg by mouth at bedtime. 09/08/20   [provider]  metoprolol succinate (TOPROL-XL) 25 MG 24 hr tablet TAKE 3 TABLETS (75 MG TOTAL) BY MOUTH DAILY. Patient taking differently: Take 75 mg by mouth daily. 01/16/21 01/16/22  [name], MD  pantoprazole (PROTONIX) 40 MG tablet Take 40 mg by mouth daily before breakfast.    [provider]  polyethylene glycol (MIRALAX / GLYCOLAX) 17 g packet Take 17 g by mouth 2 (two) times daily. 10/23/20   Barb Merino, MD  polyethylene glycol powder  (GLYCOLAX/MIRALAX) 17 GM/SCOOP powder TAKE 17 G BY MOUTH 2 (TWO) TIMES DAILY. Patient taking differently: Take 17 g by mouth in the morning and at bedtime. 10/23/20 10/23/21  Barb Merino, MD  potassium chloride SA (KLOR-CON) 20 MEQ tablet TAKE 2 TABLETS (40 MEQ TOTAL) BY MOUTH DAILY. Patient taking differently: Take 40 mEq by mouth daily. 12/23/20 12/23/21  Jadene Pierini E, PA-C  risperiDONE (RISPERDAL) 0.5 MG tablet Take 1 tablet (0.5 mg total) by mouth at bedtime. 12/14/20   Lucrezia Starch, MD  sacubitril-valsartan (ENTRESTO) 24-26 MG TAKE 1 TABLET BY MOUTH TWO TIMES DAILY. 10/23/20 10/23/21  Barb Merino, MD  senna (SENOKOT) 8.6 MG TABS tablet Take 2 tablets (17.2 mg total) by mouth 2 (two) times daily. 10/23/20   Barb Merino, MD  spironolactone (ALDACTONE) 25 MG tablet Take 0.5 tablets (12.5 mg total) by mouth daily. 01/07/21   Madalyn Rob, MD  tamsulosin (FLOMAX) 0.4 MG CAPS capsule Take 0.4  mg by mouth in the morning. 09/08/20   [provider]  traMADol (ULTRAM) 50 MG tablet TAKE 1-2 TABLETS (50-100 MG TOTAL) BY MOUTH EVERY TWELVE HOURS AS NEEDED (MILD PAIN). Patient taking differently: Take 50-100 mg by mouth every 12 (twelve) hours as needed for moderate pain. 12/23/20 06/27/2021  John Giovanni, PA-C    Allergies    Ibuprofen and Penicillins  Review of Systems   Review of Systems  Constitutional:       Per HPI, otherwise negative  HENT:       Per HPI, otherwise negative  Respiratory:       Per HPI, otherwise negative  Cardiovascular:       Per HPI, otherwise negative  Gastrointestinal: Negative for vomiting.  Endocrine:       Negative aside from HPI  Genitourinary:       Neg aside from HPI   Musculoskeletal:       Per HPI, otherwise negative  Skin: Negative.   Neurological: Negative for syncope.  Psychiatric/Behavioral: Positive for dysphoric mood, sleep disturbance and suicidal ideas.    Physical Exam Updated Vital Signs BP (!) 128/110   Pulse (!) 119    Temp 98.2 F (36.8 C) (Oral)   Resp 18   SpO2 99%   Physical Exam Vitals and nursing note reviewed.  Constitutional:      General: He is not in acute distress.    Appearance: He is well-developed. He is not ill-appearing, toxic-appearing or diaphoretic.  HENT:     Head: Normocephalic and atraumatic.  Eyes:     Conjunctiva/sclera: Conjunctivae normal.  Pulmonary:     Effort: Pulmonary effort is normal. No respiratory distress.     Breath sounds: No stridor.  Abdominal:     General: There is no distension.  Skin:    General: Skin is warm and dry.  Neurological:     Mental Status: He is alert and oriented to person, place, and time.  Psychiatric:        Mood and Affect: Mood is depressed.        Cognition and Memory: Cognition is not impaired. Memory is not impaired.     Comments: Suicidal thoughts seem consistent with multiple prior notes, consistent with suicidal rumination.     ED Results / Procedures / Treatments    Radiology DG Chest 2 View  Result Date: 03/18/2021 CLINICAL DATA:  Cough. EXAM: CHEST - 2 VIEW COMPARISON:  Radiographs and chest CT a 01/13/2021. PET CT 02/06/2021 FINDINGS: Similar cardiomegaly to prior exam. Small left pleural effusion again seen, grossly stable. No confluent airspace disease or pneumothorax. Mild chronic bronchitic change. Stable osseous structures. IMPRESSION: Stable cardiomegaly and small left pleural effusion. No acute findings. Electronically Signed   By: Keith Rake M.D.   On: 03/18/2021 23:49    Procedures Procedures   Medications Ordered in ED Medications - No data to display  ED Course  I have reviewed the triage vital signs and the nursing notes.  Pertinent labs & imaging results that were available during my care of the patient were reviewed by me and considered in my medical decision making (see chart for details).  6:27 PM Patient in no distress, sitting in his recliner chair, with headphones in place. 7:06  PM Conversation with nursing about disposition.  Prior social work notes that suggest that the patient was awaiting placement at his previous facility, but there is no space available there.  [name] designated as appropriate for discharge with  homelessness resources by her social work team, via Management consultant.   Review after initial evaluation notable for substantial documentation from social work and behavioral health over the past weeks.  Patient noted to have chronic suicidal ideation, seemingly disagreeable with plan for housing options, possibly secondary to monetary concerns.  After that evaluation with no evidence for new physical complaints, no evidence of hemodynamic instability, acknowledging the patient's ongoing chronic suicidal ideation, with notation of behavioral health clearance earlier in the day, patient medically cleared for discharge, social work has been consulted to ascertain any possible additional resources for housing.  Final Clinical Impression(s) / ED Diagnoses Final diagnoses:  Homelessness  Despondency     Carmin Muskrat, MD 03/19/21 1519    Carmin Muskrat, MD 03/19/21 Greer Ee    Carmin Muskrat, MD 03/19/21 1906

## 2021-03-19 NOTE — ED Notes (Signed)
This nurse made social worker aware that patient was stating "If you send me home I will take all of my medicine and overdose." Psychiatry is aware

## 2021-03-19 NOTE — BH Assessment (Signed)
Comprehensive Clinical Assessment (CCA) Note  03/19/2021 Charles [name] [id number]   Disposition Lindon Romp, NP, recommends overnight observation for safety and stabilization with psych reassessment in the AM. Patient will remain at [facility] due to fall risk. Dr. Sedonia Small, Kennith Maes, [place] and [place], [place], informed of disposition via secure chat.   The patient demonstrates the following risk factors for suicide: Chronic risk factors for suicide include: psychiatric disorder of schizophrenia, depression and bipolar, previous suicide attempts 1x and medical illness coughing. Acute risk factors for suicide include: family or marital conflict and social withdrawal/isolation. Protective factors for this patient include: positive therapeutic relationship, responsibility to others (children, family) and hope for the future. Considering these factors, the overall suicide risk at this point appears to be high. [name] not appropriate for outpatient follow up.  [facility] from 03/18/2021 in [facility] ED from 03/03/2021 in [facility] ED from 02/27/2021 in [facility] High Risk Error: Q3, 4, or 5 should not be populated when Q2 is No High Risk     1:1 risk  Charles [name] is a 55 year old male presenting voluntarily to [facility] due to  [facility] with plan to overdose on pill and auditory hallucinations to harm himself. Patient reported onset of SI was last night. Patient identified stressors and triggers as "housing, coughing all the time and just little stuff, I have too much to deal with". Patient reported going to assisted living program after he was told he would have housing, once patient arrived the assisted living program changed their minds and currently does not have a place to stay. Patient reported having a psychiatrist for medication management. When asked about medications  working, patient stated "I don't know". Patient was vague with answers. Patient reported history of suicide attempt and inpatient psych treatment, however unable to give timeframe. Patient denied self-harming behaviors. Patient reported worsening depressive symptoms. Patient cooperative during assessment. Patient unable to contract for safety.   Chief Complaint:  Chief Complaint  Patient presents with  . Suicidal   Visit Diagnosis:  Major depressive disorder Hx of Schizoprhenia Hx of Bipolar  CCA Biopsychosocial Intake/Chief Complaint:  SI with plan to overdose and hallucations.  Current Symptoms/Problems: Depression, AH, and some SI.  Patient Reported Schizophrenia/Schizoaffective Diagnosis in Past: Yes  Strengths: Not assessed  Preferences: Not assessed  Abilities: Not assessed  Type of Services Patient Feels are Needed: inpatient and housing  Initial Clinical Notes/Concerns: N/A  Mental Health Symptoms Depression:  Sleep (too much or little); Change in energy/activity; Increase/decrease in appetite; Hopelessness; Worthlessness; Fatigue   Duration of Depressive symptoms: Less than two weeks   Mania:  None   Anxiety:   Fatigue; Restlessness; Sleep; Tension; Worrying   Psychosis:  Hallucinations   Duration of Psychotic symptoms: Greater than six months   Trauma:  None   Obsessions:  None   Compulsions:  None   Inattention:  None   Hyperactivity/Impulsivity:  N/A   Oppositional/Defiant Behaviors:  None   Emotional Irregularity:  None   Other Mood/Personality Symptoms:  None noted    Mental Status Exam Appearance and self-care  Stature:  Average   Weight:  Average weight   Clothing:  Age-appropriate   Grooming:  Normal   Cosmetic use:  None   Posture/gait:  Normal; Other (Comment) (Patient requires a walker)   Motor activity:  Not Remarkable   Sensorium  Attention:  Normal  Concentration:  Normal   Orientation:  X5   Recall/memory:   Normal   Affect and Mood  Affect:  Anxious; Depressed   Mood:  Anxious; Depressed; Worthless; Hopeless   Relating  Eye contact:  Normal   Facial expression:  Depressed   Attitude toward examiner:  Cooperative   Thought and Language  Speech flow: Normal   Thought content:  Appropriate to Mood and Circumstances   Preoccupation:  None   Hallucinations:  Auditory   Organization:  No data recorded  [employer] of Knowledge:  Average   Intelligence:  Average   Abstraction:  Functional   Judgement:  Impaired   Reality Testing:  Realistic   Insight:  Fair   Decision Making:  Impulsive   Social Functioning  Social Maturity:  Isolates   Social Judgement:  Normal   Stress  Stressors:  Illness; Financial; Housing   Coping Ability:  Exhausted; Overwhelmed   Skill Deficits:  Self-control   Supports:  Support needed    Religion: Religion/Spirituality Are You A Religious Person?: Yes (not assessed) What is Your Religious Affiliation?: Catholic How Might This Affect Treatment?: Not assessed  Leisure/Recreation: Leisure / Recreation Do You Have Hobbies?: Yes Leisure and Hobbies: T.V  Exercise/Diet: Exercise/Diet Do You Exercise?: No Have You Gained or Lost A Significant Amount of Weight in the Past Six Months?: No Do You Follow a Special Diet?: Yes Type of Diet: Special diet due to heart condition Do You Have Any Trouble Sleeping?: Yes Explanation of Sleeping Difficulties: States no sleep for the past 4 days.  CCA Employment/Education Employment/Work Situation: Employment / Work Situation Employment situation: On disability Why is patient on disability: Mental health and physical disability How long has patient been on disability: Since the 1990's Patient's job has been impacted by current illness:  (N/A) What is the longest time patient has a held a job?: Not assessed Where was the patient employed at that time?: Not assessed Has patient  ever been in the [employer]?: No  Education: Education Last Grade Completed: 9 Name of [place]: Unknown high school in [place] Did Teacher, adult education From [employer]?: No (patient did get his GED) Did Physicist, medical?: No Did You Attend Graduate School?: No Did You Have Any Special Interests In School?: None reported Did You Have An Individualized Education Program (IIEP): No Did You Have Any Difficulty At School?: No  CCA Family/Childhood History Family and Relationship History: Family history Marital status: Divorced Divorced, when?: Not assessed What types of issues is patient dealing with in the relationship?: Not assessed Additional relationship information: Not assessed Are you sexually active?:  (Not assessed) What is your sexual orientation?: Not assessed Has your sexual activity been affected by drugs, alcohol, medication, or emotional stress?: Not assessed Does patient have children?: Yes How many children?: 1 (Pt states he has 1-2 children; pt did not elaborate on this) How is patient's relationship with their children?: not assessed  Childhood History:  Childhood History By whom was/is the patient raised?: Grandparents Additional childhood history information: Patient states that his mother had mental health issues Description of patient's relationship with caregiver when they were a child: Patient states that he was raised by his grandparents in what he describes as a good home Patient's description of current relationship with people who raised him/her: Not assessed How were you disciplined when you got in trouble as a child/adolescent?: Not assessed Does patient have siblings?:  (Not assessed) Did patient suffer any verbal/emotional/physical/sexual abuse as  a child?: No Did patient suffer from severe childhood neglect?: No Has patient ever been sexually abused/assaulted/raped as an adolescent or adult?: No Was the patient ever a victim of a crime or a  disaster?: No Witnessed domestic violence?: No Has patient been affected by domestic violence as an adult?: No  Child/Adolescent Assessment:   CCA Substance Use Alcohol/Drug Use: Alcohol / Drug Use Pain Medications: see MAR Prescriptions: see MAR Over the Counter: see MAR History of alcohol / drug use?: No history of alcohol / drug abuse Longest period of sobriety (when/how long): N/A Negative Consequences of Use:  (N/A) Withdrawal Symptoms:  (N/A)   ASAM's:  Six Dimensions of Multidimensional Assessment  Dimension 1:  Acute Intoxication and/or Withdrawal Potential:      Dimension 2:  Biomedical Conditions and Complications:      Dimension 3:  Emotional, Behavioral, or Cognitive Conditions and Complications:     Dimension 4:  Readiness to Change:     Dimension 5:  Relapse, Continued use, or Continued Problem Potential:     Dimension 6:  Recovery/Living Environment:     ASAM Severity Score:    ASAM Recommended Level of Treatment: ASAM Recommended Level of Treatment:  (N/A)   Substance use Disorder (SUD) Substance Use Disorder (SUD)  Checklist Symptoms of Substance Use:  (N/A)  Recommendations for Services/Supports/Treatments: Recommendations for Services/Supports/Treatments Recommendations For Services/Supports/Treatments: Medication Management,Individual Therapy,Other (Comment) (Overnight observation in the ED)  DSM5 Diagnoses: Patient Active Problem List   Diagnosis Date Noted  . MDD (major depressive disorder), recurrent, severe, with psychosis ([place]) 02/28/2021  . Acute hypoxemic respiratory failure ([place]) 01/13/2021  . Acute on chronic heart failure with reduced ejection fraction and diastolic dysfunction ([place]) 01/12/2021  . Schizophrenia ([place]) 01/04/2021  . Diarrhea 01/04/2021  . Pulmonary edema   . Mediastinal adenopathy 12/18/2020  . Adjustment disorder with mixed anxiety and depressed mood 10/30/2020  . Mediastinal lymphadenopathy 10/11/2020  . Shortness of  breath 10/11/2020  . Sinus tachycardia 10/11/2020  . Night sweats 10/11/2020  . DM2 (diabetes mellitus, type 2) ([place]) 10/11/2020  . 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 Hypertension 09/25/2020  . BPH (benign prostatic hyperplasia) 09/25/2020  . Insomnia 09/25/2020   Patient Centered Plan: [name] on the following Treatment Plan(s):   Referrals to Alternative Service(s): Referred to Alternative Service(s):   Place:   Date:   Time:    Referred to Alternative Service(s):   Place:   Date:   Time:    Referred to Alternative Service(s):   Place:   Date:   Time:    Referred to Alternative Service(s):   Place:   Date:   Time:     Venora Maples, [facility]

## 2021-03-19 NOTE — ED Provider Notes (Signed)
[facility] EMERGENCY DEPARTMENT Provider Note   CSN: [id number] Arrival date & time: 03/18/21  2247     History Chief Complaint  Patient presents with  . Suicidal    BOND GRIESHOP is a 55 y.o. male with a hx of HTN, T2DM, bipolar 1 disorder, CHF, & nodular lymphocyte predominant hodgkin lymphoma, schizophrenia, & bipolar disorder who presents to the ED with complaints of SI recently. States he has had thoughts of suicide with plan to overdose, has not attempted overdose recently, also having auditory hallucination telling him to harm himself. No HI. No specific alleviating/aggravating factors that he reports currently. He also mentions that his heart is beating quickly, he thinks this is because he did not take his metoprolol today. Has had a cough, but this is chronic, worse after being in the rain the other day. Denies fever, chills, chest pain, dyspnea, leg pain/swelling, or syncope.    HPI     Past Medical History:  Diagnosis Date  . Arthritis    lower back  . Bipolar 1 disorder ([place])   . Cardiomyopathy, nonischemic ([place])    no CAD 10/14/20 cath, EF 20-25% by 10/11/20 echo  . CHF (congestive heart failure) ([place])   . COVID 10/2020  . Depression   . DM2 (diabetes mellitus, type 2) ([place])   . HTN (hypertension)   . Myocardial infarction [facility])     Patient Active Problem List   Diagnosis Date Noted  . MDD (major depressive disorder), recurrent, severe, with psychosis ([place]) 02/28/2021  . Acute hypoxemic respiratory failure ([place]) 01/13/2021  . Acute on chronic heart failure with reduced ejection fraction and diastolic dysfunction ([place]) 01/12/2021  . Schizophrenia ([place]) 01/04/2021  . Diarrhea 01/04/2021  . Pulmonary edema   . Mediastinal adenopathy 12/18/2020  . Adjustment disorder with mixed anxiety and depressed mood 10/30/2020  . Mediastinal lymphadenopathy 10/11/2020  . Shortness of breath 10/11/2020  . Sinus tachycardia 10/11/2020  . Night sweats  10/11/2020  . DM2 (diabetes mellitus, type 2) ([place]) 10/11/2020  . 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 Hypertension 09/25/2020  . BPH (benign prostatic hyperplasia) 09/25/2020  . Insomnia 09/25/2020    Past Surgical History:  Procedure Laterality Date  . BACK SURGERY  01/2020  . CARDIAC CATHETERIZATION  10/2020  . CHOLECYSTECTOMY    . HERNIA REPAIR    . INTERCOSTAL NERVE BLOCK Left 12/18/2020   Procedure: INTERCOSTAL NERVE BLOCK;  Surgeon: Melrose Nakayama, MD;  Location: [facility];  Service: Thoracic;  Laterality: Left;  . LYMPH NODE BIOPSY Left 12/18/2020   Procedure: LYMPH NODE BIOPSY;  Surgeon: Melrose Nakayama, MD;  Location: [facility];  Service: Thoracic;  Laterality: Left;  . RIGHT/LEFT HEART CATH AND CORONARY ANGIOGRAPHY N/A 10/14/2020   Procedure: RIGHT/LEFT HEART CATH AND CORONARY ANGIOGRAPHY;  Surgeon: Jettie Booze, MD;  Location: [facility];  Service: Cardiovascular;  Laterality: N/A;  . SPINAL FIXATION SURGERY  02/26/2020  . XI ROBOTIC ASSISTED THORASCOPY FOR BIOPSY AP WINDOW LYMPH NODES (Left)  12/18/2020       Family History  Problem Relation Age of Onset  . Hypertension Mother   . Hyperlipidemia Mother   . Stroke Mother   . Cancer Mother   . Hypertension Father   . Hyperlipidemia Father   . Healthy Sister     Social History   Tobacco Use  . Smoking status: Current Some Day Smoker    Packs/day: 0.25  Types: Cigarettes  . Smokeless tobacco: Never Used  . Tobacco comment: occasionally  Vaping Use  . Vaping Use: Never used  Substance Use Topics  . Alcohol use: Never  . Drug use: Not Currently    Types: Cocaine    Home Medications Prior to Admission medications   Medication Sig Start Date End Date Taking? Authorizing Provider  acetaminophen (TYLENOL) 325 MG tablet Take 650 mg by mouth every 6 (six) hours as needed for moderate pain.    [provider]  atorvastatin  (LIPITOR) 10 MG tablet Take 10 mg by mouth daily. 09/30/20   [provider]  blood glucose meter kit and supplies KIT Dispense based on patient and insurance preference. Use up to four times daily as directed. (FOR ICD-9 250.00, 250.01). 10/23/20   Barb Merino, MD  brimonidine (ALPHAGAN) 0.2 % ophthalmic solution Place 1 drop into both eyes 2 (two) times daily.     [provider]  dapagliflozin propanediol (FARXIGA) 10 MG TABS tablet Take 1 tablet (10 mg total) by mouth daily. 10/23/20   Barb Merino, MD  dapagliflozin propanediol (FARXIGA) 10 MG TABS tablet TAKE 1 TABLET (10 MG TOTAL) BY MOUTH DAILY. 10/23/20 10/23/21  Barb Merino, MD  digoxin (LANOXIN) 0.125 MG tablet Take 1 tablet (125 mcg total) by mouth daily. 12/29/20 12/29/21  Clegg, Amy D, NP  digoxin (LANOXIN) 0.125 MG tablet TAKE 1 TABLET (0.125 MG TOTAL) BY MOUTH DAILY. 10/23/20 10/23/21  Barb Merino, MD  Ensure Plus (ENSURE PLUS) LIQD Take 237 mLs by mouth 2 (two) times daily between meals. chocolate Patient not taking: Reported on 03/04/2021    [provider]  furosemide (LASIX) 40 MG tablet Take 1 tablet (40 mg total) by mouth daily. Patient taking differently: Take 40 mg by mouth in the morning. 01/16/21   [name], MD  furosemide (LASIX) 40 MG tablet TAKE 1 TABLET (40 MG TOTAL) BY MOUTH DAILY. 01/16/21 01/16/22  [name], MD  furosemide (LASIX) 40 MG tablet TAKE 1 TABLET (40 MG TOTAL) BY MOUTH DAILY. 10/23/20 10/23/21  Barb Merino, MD  furosemide (LASIX) 80 MG tablet TAKE 1 TABLET (80 MG TOTAL) BY MOUTH TWO TIMES DAILY. 12/23/20 12/23/21  Gold, Patrick Jupiter E, PA-C  insulin detemir (LEVEMIR) 100 UNIT/ML FlexPen INJECT 25 UNITS INTO THE SKIN DAILY. 10/23/20 10/23/21  Barb Merino, MD  Insulin Pen Needle (PEN NEEDLES 29GX1/2") 29G X 12MM MISC 1 each by Does not apply route daily. 10/23/20   Barb Merino, MD  Insulin Pen Needle 32G X 4 MM MISC USE AS DIRECTED. 10/23/20 10/23/21  Barb Merino, MD  latanoprost (XALATAN) 0.005 % ophthalmic solution Place 1 drop into both eyes at bedtime.  Patient not taking: Reported on 03/04/2021    [provider]  levothyroxine (SYNTHROID) 100 MCG tablet Take 100 mcg by mouth daily before breakfast.    [provider]  lidocaine (XYLOCAINE) 2 % solution Caregiver: Mix 1part 2% viscous lidocaine, 1part H20. Patient: Swallow 87m of diluted mixture, 339m before meals and bedtime, up to QID PRN sore throat. 03/09/21   SqEppie GibsonMD  melatonin 3 MG TABS tablet Take 3 mg by mouth at bedtime. Patient not taking: Reported on 03/04/2021 09/08/20   [provider]  metoprolol succinate (TOPROL XL) 100 MG 24 hr tablet Take 1 tablet (100 mg total) by mouth daily. Take with or immediately following a meal. Patient not taking: Reported on 03/04/2021 01/22/21 01/22/22  WiMaudie MercuryMD  metoprolol succinate (TOPROL-XL) 100 MG 24  hr tablet TAKE 1 TABLET (100 MG TOTAL) BY MOUTH DAILY. TAKE WITH OR IMMEDIATELY FOLLOWING A MEAL. 01/22/21 01/22/22  Maudie Mercury, MD  metoprolol succinate (TOPROL-XL) 100 MG 24 hr tablet TAKE 1 TABLET (100 MG TOTAL) BY MOUTH DAILY. TAKE WITH OR IMMEDIATELY FOLLOWING A MEAL. 12/24/20 12/24/21  Antony Odea, PA-C  metoprolol succinate (TOPROL-XL) 25 MG 24 hr tablet TAKE 3 TABLETS (75 MG TOTAL) BY MOUTH DAILY. 01/16/21 01/16/22  [name], MD  metoprolol succinate (TOPROL-XL) 25 MG 24 hr tablet Take 75 mg by mouth in the morning.    [provider]  metoprolol succinate (TOPROL-XL) 50 MG 24 hr tablet TAKE 1 AND 1/2 TABLETS (75 MG TOTAL) BY MOUTH DAILY. TAKE WITH OR IMMEDIATELY FOLLOWING A MEAL. 01/16/21 01/16/22  [name], MD  metoprolol succinate (TOPROL-XL) 50 MG 24 hr tablet TAKE 3 TABLETS (150 MG TOTAL) BY MOUTH DAILY. TAKE WITH OR IMMEDIATELY FOLLOWING A MEAL. 12/23/20 12/23/21  Gold, Patrick Jupiter E, PA-C  metoprolol succinate (TOPROL-XL) 50 MG 24 hr tablet TAKE 1 TABLET (50 MG TOTAL) BY MOUTH  AT BEDTIME. TAKE WITH OR IMMEDIATELY FOLLOWING A MEAL. 10/23/20 10/23/21  Barb Merino, MD  metoprolol tartrate (LOPRESSOR) 25 MG tablet Take 75 mg by mouth daily. Patient not taking: Reported on 03/04/2021    [provider]  pantoprazole (PROTONIX) 40 MG tablet Take 40 mg by mouth daily before breakfast.    [provider]  polyethylene glycol (MIRALAX / GLYCOLAX) 17 g packet Take 17 g by mouth 2 (two) times daily. 10/23/20   Barb Merino, MD  polyethylene glycol powder (GLYCOLAX/MIRALAX) 17 GM/SCOOP powder TAKE 17 G BY MOUTH 2 (TWO) TIMES DAILY. 10/23/20 10/23/21  Barb Merino, MD  potassium chloride SA (KLOR-CON) 20 MEQ tablet TAKE 2 TABLETS (40 MEQ TOTAL) BY MOUTH DAILY. 12/23/20 12/23/21  Jadene Pierini E, PA-C  risperiDONE (RISPERDAL) 0.5 MG tablet Take 1 tablet (0.5 mg total) by mouth at bedtime. Patient not taking: No sig reported 12/14/20   Lucrezia Starch, MD  sacubitril-valsartan (ENTRESTO) 24-26 MG Take 1 tablet by mouth 2 (two) times daily.    [provider]  sacubitril-valsartan (ENTRESTO) 24-26 MG TAKE 1 TABLET BY MOUTH TWO TIMES DAILY. 10/23/20 10/23/21  Barb Merino, MD  senna (SENOKOT) 8.6 MG TABS tablet Take 2 tablets (17.2 mg total) by mouth 2 (two) times daily. 10/23/20   Barb Merino, MD  senna (SENOKOT) 8.6 MG TABS tablet TAKE 2 TABLETS (17.2 MG TOTAL) BY MOUTH TWO TIMES DAILY. 10/23/20 10/23/21  Barb Merino, MD  spironolactone (ALDACTONE) 25 MG tablet Take 0.5 tablets (12.5 mg total) by mouth daily. 01/07/21   Madalyn Rob, MD  spironolactone (ALDACTONE) 25 MG tablet TAKE 1 TABLET (25 MG TOTAL) BY MOUTH DAILY. 10/23/20 10/23/21  Barb Merino, MD  tamsulosin (FLOMAX) 0.4 MG CAPS capsule Take 0.4 mg by mouth in the morning. 09/08/20   [provider]  traMADol (ULTRAM) 50 MG tablet TAKE 1-2 TABLETS (50-100 MG TOTAL) BY MOUTH EVERY TWELVE HOURS AS NEEDED (MILD PAIN). 12/23/20 07/05/2021  John Giovanni, PA-C    Allergies    Ibuprofen  and Penicillins  Review of Systems   Review of Systems  Constitutional: Negative for chills and fever.  Respiratory: Positive for cough. Negative for shortness of breath.   Cardiovascular: Positive for palpitations. Negative for chest pain and leg swelling.  Gastrointestinal: Negative for abdominal pain, diarrhea, nausea and vomiting.  Neurological: Negative for syncope.  Psychiatric/Behavioral: Positive for hallucinations and suicidal ideas.  All  other systems reviewed and are negative.   Physical Exam Updated Vital Signs BP 105/75 (BP Location: Left Arm)   Pulse (!) 110   Temp 98.2 F (36.8 C) (Oral)   Resp (!) 22   SpO2 95%   Physical Exam Vitals and nursing note reviewed.  Constitutional:      General: He is not in acute distress.    Appearance: He is well-developed. He is not toxic-appearing.  HENT:     Head: Normocephalic and atraumatic.  Eyes:     General:        Right eye: No discharge.        Left eye: No discharge.     Conjunctiva/sclera: Conjunctivae normal.  Cardiovascular:     Rate and Rhythm: Regular rhythm. Tachycardia present.  Pulmonary:     Effort: Pulmonary effort is normal. No respiratory distress.     Breath sounds: Normal breath sounds. No wheezing, rhonchi or rales.  Abdominal:     General: There is no distension.     Palpations: Abdomen is soft.     Tenderness: There is no abdominal tenderness.  Musculoskeletal:     Cervical back: Neck supple.  Skin:    General: Skin is warm and dry.     Findings: No rash.  Neurological:     Mental Status: He is alert.     Comments: Clear speech.   Psychiatric:        Thought Content: Thought content includes suicidal ideation. Thought content does not include homicidal ideation. Thought content includes suicidal plan.     ED Results / Procedures / Treatments   Labs (all labs ordered are listed, but only abnormal results are displayed) Labs Reviewed  COMPREHENSIVE METABOLIC PANEL - Abnormal; Notable  for the following components:      Result Value   Glucose, Bld 119 (*)    Creatinine, Ser 1.31 (*)    All other components within normal limits  SALICYLATE LEVEL - Abnormal; Notable for the following components:   Salicylate Lvl <1.4 (*)    All other components within normal limits  ACETAMINOPHEN LEVEL - Abnormal; Notable for the following components:   Acetaminophen (Tylenol), Serum <10 (*)    All other components within normal limits  CBC - Abnormal; Notable for the following components:   MCV 79.2 (*)    MCH 25.3 (*)    RDW 17.7 (*)    All other components within normal limits  RESP PANEL BY RT-PCR (FLU A&B, COVID) [id number]  ETHANOL  RAPID URINE DRUG SCREEN, HOSP PERFORMED    EKG EKG Interpretation  Date/Time:  Wednesday Mar 18 2021 23:08:06 EDT Ventricular Rate:  116 PR Interval:  162 QRS Duration: 106 QT Interval:  348 QTC Calculation: 483 R Axis:   72 Text Interpretation: Sinus tachycardia with occasional Premature ventricular complexes Biatrial enlargement Abnormal ECG Confirmed by Gerlene Fee [phone]) on 03/19/2021 2:19:36 AM   Radiology DG Chest 2 View  Result Date: 03/18/2021 CLINICAL DATA:  Cough. EXAM: CHEST - 2 VIEW COMPARISON:  Radiographs and chest CT a 01/13/2021. PET CT 02/06/2021 FINDINGS: Similar cardiomegaly to prior exam. Small left pleural effusion again seen, grossly stable. No confluent airspace disease or pneumothorax. Mild chronic bronchitic change. Stable osseous structures. IMPRESSION: Stable cardiomegaly and small left pleural effusion. No acute findings. Electronically Signed   By: Keith Rake M.D.   On: 03/18/2021 23:49    Procedures Procedures   Medications Ordered in ED Medications - No data to display  ED Course  I have reviewed the triage vital signs and the nursing notes.  Pertinent labs & imaging results that were available during my care of the patient were reviewed by me and considered in my medical decision making (see chart  for details).    MDM Rules/Calculators/A&P                         Patient presents to the ED with complaints of SI.  Nontoxic, noted to be tachycardic in the 120s on arrival, currently 110- sinus rhythm on EKG, suspect not taking his metoprolol is playing a factor. Exam otherwise fairly benign.   Additional history obtained:  Additional history obtained from chart review & nursing note review.   EKG: Sinus tachycardia with occasional Premature ventricular complexes Biatrial enlargement Abnormal ECG Lab Tests:  I Ordered, reviewed, and interpreted labs, which included:  CBC: Fairly unremarkable.  CMP: mild creatinine elevation compared to most recent but similar to prior ranges on record over the past 1 month.  Ethanol/acetaminophen/salicylate levels: WNL UDS: Negative.  Imaging Studies ordered:  I ordered imaging studies which included CXR, I independently reviewed, formal radiology impression shows: Stable cardiomegaly and small left pleural effusion. No acute findings  ED Course:  Overall results similar to prior, repeat EKG with improved rate @ 98, no chest pain or dyspnea reported initially, during repeat EKG when the V1 sticker was placed patient reported some burning pain well localized to this area which he states was from the sticker, was having the discomfort during repeat EKG, worse when he pulled the sticker off as it "stung", now just feels sore, reproducible with palpation of the area, suspect localized irritation, no signs of infection or allergic reaction, do not feel this needs further evaluation currently. Patient medically cleared. Consult placed to TTS. Home meds re-ordered.   The patient has been placed in psychiatric observation due to the need to provide a safe environment for the patient while obtaining psychiatric consultation and evaluation, as well as ongoing medical and medication management to treat the patient's condition.  The patient has not been placed under  full IVC at this time.  Findings and plan of care discussed with supervising physician Dr. Sedonia Small who is in agreement.   Portions of this note were generated with [employer]obby[employer]. Dictation errors may occur despite best attempts at proofreading.  Final Clinical Impression(s) / ED Diagnoses Final diagnoses:  Suicidal ideation    Rx / DC Orders ED Discharge Orders    None       Amaryllis Dyke, PA-C 03/19/21 0554    Maudie Flakes, MD 03/19/21 [phone]

## 2021-03-19 NOTE — ED Notes (Signed)
Per MD/SW, pt unable to be placed in assisted living and ok to be discharged with resources for [facility] shelters. Reviewed discharge papers with patient at bedside. Pt frustrated with staff bc we are unable to find him placement at this time. Pt understanding and willing to follow-up with provided resources.

## 2021-03-19 NOTE — Progress Notes (Signed)
Psychiatry made aware of patient stating if he leaves the hospital he will take his pills and overdose. [name] still psych cleared and up for discharge.

## 2021-03-19 NOTE — Discharge Instructions (Addendum)
You have been seen and evaluated by physicians, psychiatrists and our social workers over the past day.  Please be sure to use the provided homeless shelter resources or return to your prior residence.  For ongoing therapy of your despondency, and suicidal thoughts that is more than that you follow-up with your mental health providers.  Please take all medication as directed.  I do not hesitate to return here for concerning changes in your condition.

## 2021-03-19 NOTE — Progress Notes (Signed)
CSW received a call back from Marval Regal at [facility]F. Patient had an assessment on 03/10/21 and patient left on his own from the hospital on 03/14/21. Patient was denied placement because he could not answer some of the questions and he also did not want to give up his SSI check to the facility. Facilities who accept medicaid usually ask for a secondary pay source to pay for their room. Patient told Ms. Stann Mainland that he felt his previous facility was stealing his money. The SSI check has to be signed over to the facility to pay for his room at assisted living.

## 2021-03-19 NOTE — ED Notes (Addendum)
Plan of care update:  Lindon Romp, NP, recommends overnight observation for safety and stabilization with psych reassessment in the AM. Patient will remain at [facility] due to fall risk.

## 2021-03-19 NOTE — ED Triage Notes (Signed)
Pt presents with c/o suicidal ideation. Pt reports he has an active plan to overdose on pills. Pt reports that he went to [facility] last night and they believed he was playing the system and did not take him seriously. Pt is calm and cooperative at this time.

## 2021-03-19 NOTE — ED Notes (Signed)
Pt given pt belongings back (12 pt belonging bags, 1 walker). Pt TV and cell phone retrieved from security and also given back to pt.

## 2021-03-19 NOTE — ED Provider Notes (Signed)
55 yo M I assumed care at 0 700.  Patient with multiple visits to the emergency department and was recently in the hospital awaiting 2 weeks for placement.  The patient then elected to leave voluntarily after being there for short period of time.  Now back with a similar complaint that he has no place to stay.  Also complaining of suicidality which appears to be chronic.  Was evaluated by psychiatry and felt to be cleared from their standpoint.  Social work is evaluated the patient is and is offer him what resources that they feel he is left.  At this point I do not feel that he needs to stay in the emergency department indefinitely.  We will have him follow-up with his family doctor.   Deno Etienne, DO 03/19/21 1047

## 2021-03-19 NOTE — Progress Notes (Signed)
CSW received a consult that patient was going to be cleared today. CSW spoke with patient and he stated he did not sign himself out of [facility] 4 days ago. Patient stated he spoke to a lady yesterday. Patient was unsure about the time frame. Patient stated that they were going to do an assessment on him but did not tell him what time or what day the assessment would be held on. Patient willingly signed himself out of [facility] a few weeks ago. A few weeks ago [facility] was not willing to take patient back. CSW reminded patient that he was in the hospital previously and CSW's tried to place him in an assisted living facility but he did not qualify for the ones in the [place] area. CSW's then attempted the [facility] area but learned that some facilities are only private pay or do not take Medicaid. Patient completed an assessment with [facility] in [place] during his previous admission on 03/10/2021. Patient stated on 03/10/2021 that he is tired of relying on people and that he can eat, bath, and take care of himself. Patient on that day got himself a bed at the [facility] [place]. CSW confirmed with admissions and they were willing to hold the bed until he was discharged. Patient decided to go to the shelter instead of the possibility of getting into a ALF. Patient left on 03/14/2021 and stated he was going to a hotel instead of the shelter. Patient's reason for not going to the shelter is because he has family on drugs in the [place] area and does not want to be around that. Patient now is saying all that has occurred in the last week is a big mistake. CSW told patient to follow-up with [facility] and CSW also gave patient the number to [facility] to see if they were willing to take him after his assessment and with him leaving the hospital on his own. CSW also gave another number to [facility] who is the sister facility to [facility] for patient  to follow-up with. Patient asked CSW where is he supposed to go. CSW stated she can give him shelter resources and patient stated he tried all of those.

## 2021-03-19 NOTE — Consult Note (Signed)
Telepsych Consultation   Reason for Consult:  Psychiatry Reassessment Referring Physician:   Location of Patient: [facility] Location of Provider: [place] [facility]  Patient Identification: Charles [name] MRN:  [id number] Principal Diagnosis: Adjustment disorder with mixed anxiety and depressed mood Diagnosis:  Principal Problem:   Adjustment disorder with mixed anxiety and depressed mood   Total Time spent with patient: 30 minutes  Subjective:   Charles [name] is a 55 y.o. male patient.  Patient states "I want to be in an assisted living facility and I thought I was excepted but then they said there was no bed available."  Patient endorses passive, chronic suicidal ideations today.  He contracts verbally for safety with this Probation officer.  Reports he is frustrated with housing situation at this time, he reports he does not want to harm himself.  HPI:    Charles [name] reports he left his assisted living without loss since October in [facility] approximately 4 days ago because "they are supposed to give me an allowance of $55 per month but I did not receive it."  He left the facility and went to a hotel but decided he would like to return to facility after 4 days.  Facility now has no available space for Charles [name] and he would like a different assisted living facility.  Charles [name] has been diagnosed with major depressive disorder as well as schizophrenia in the past.  He reports he is compliant with his medications, when administered by facility staff.  He reports he did not take medications for 4 days when at hotel however he did have his medications with him in his belongings.  He reports he is followed by outpatient at [facility] behavioral health.  Charles [name] assessed by nurse practitioner.  He is alert and oriented, answers appropriately.  He is pleasant and cooperative during assessment.  He is insightful regarding situation and now believes he would like to return to  assisted living because he "needs help with medications and appointments."  He endorses 2 prior suicide attempts, last attempt approximately 25 years ago.  Charles [name] denies homicidal ideations.  He denies auditory and visual hallucinations.  There is no evidence of delusional thought content and he denies symptoms of paranoia.  Charles [name] reports he is currently staying at a hotel.  He denies access to weapons.  He would like to return to assisted living and reports he receives disability income.  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  Patient offered support and encouragement.  He denies anyone to contact on his behalf currently.  He reports he is his own payee and has no guardian.  Past Psychiatric History: Schizophrenia, major depressive disorder  Risk to Self:  Denies Risk to Others:  Denies Prior Inpatient Therapy:   Prior Outpatient Therapy:  [facility] behavioral health  Past Medical History:  Past Medical History:  Diagnosis Date  . Arthritis    lower back  . Bipolar 1 disorder ([place])   . Cardiomyopathy, nonischemic ([place])    no CAD 10/14/20 cath, EF 20-25% by 10/11/20 echo  . CHF (congestive heart failure) ([place])   . COVID 10/2020  . Depression   . DM2 (diabetes mellitus, type 2) ([place])   . HTN (hypertension)   . Myocardial infarction [facility])     Past Surgical History:  Procedure Laterality Date  . BACK SURGERY  01/2020  . CARDIAC CATHETERIZATION  10/2020  . CHOLECYSTECTOMY    . HERNIA REPAIR    . INTERCOSTAL NERVE BLOCK Left 12/18/2020   Procedure: INTERCOSTAL NERVE BLOCK;  Surgeon: Melrose Nakayama, MD;  Location: [place];  Service: Thoracic;  Laterality: Left;  . LYMPH NODE BIOPSY Left 12/18/2020   Procedure: LYMPH NODE BIOPSY;  Surgeon: Melrose Nakayama, MD;  Location: [place];  Service: Thoracic;  Laterality: Left;  . RIGHT/LEFT HEART CATH AND CORONARY ANGIOGRAPHY N/A 10/14/2020    Procedure: RIGHT/LEFT HEART CATH AND CORONARY ANGIOGRAPHY;  Surgeon: Jettie Booze, MD;  Location: [facility];  Service: Cardiovascular;  Laterality: N/A;  . SPINAL FIXATION SURGERY  02/26/2020  . XI ROBOTIC ASSISTED THORASCOPY FOR BIOPSY AP WINDOW LYMPH NODES (Left)  12/18/2020   Family History:  Family History  Problem Relation Age of Onset  . Hypertension Mother   . Hyperlipidemia Mother   . Stroke Mother   . Cancer Mother   . Hypertension Father   . Hyperlipidemia Father   . Healthy Sister    Family Psychiatric  History: None reported Social History:  Social History   Substance and Sexual Activity  Alcohol Use Never     Social History   Substance and Sexual Activity  Drug Use Not Currently  . Types: Cocaine    Social History   Socioeconomic History  . Marital status: Divorced    Spouse name: Not on file  . Number of children: 1  . Years of education: Not on file  . Highest education level: Not on file  Occupational History  . Not on file  Tobacco Use  . Smoking status: Current Some Day Smoker    Packs/day: 0.25    Types: Cigarettes  . Smokeless tobacco: Never Used  . Tobacco comment: occasionally  Vaping Use  . Vaping Use: Never used  Substance and Sexual Activity  . Alcohol use: Never  . Drug use: Not Currently    Types: Cocaine  . Sexual activity: Not Currently  Other Topics Concern  . Not on file  Social History Narrative  . Not on file   Social Determinants of Health   Financial Resource Strain: Low Risk   . Difficulty of Paying Living Expenses: Not hard at all  Food Insecurity: No Food Insecurity  . Worried About Charity fundraiser in the Last Year: Never true  . Ran Out of Food in the Last Year: Never true  Transportation Needs: No Transportation Needs  . Lack of Transportation (Medical): No  . Lack of Transportation (Non-Medical): No  Physical Activity: Insufficiently Active  . Days of Exercise per Week: 7 days  .  Minutes of Exercise per Session: 10 min  Stress: No Stress Concern Present  . Feeling of Stress : Only a little  Social Connections: Not on file   Additional Social History:    Allergies:   Allergies  Allergen Reactions  . Ibuprofen Other (See Comments)    Unknown per MAR  . Penicillins Rash    Labs:  Results for orders placed or performed during the hospital encounter of 03/18/21 (from the past 48 hour(s))  Resp Panel by RT-PCR (Flu A&B, Covid) Nasopharyngeal Swab     Status: None   Collection Time: 03/18/21  6:44 AM   Specimen: Nasopharyngeal Swab; Nasopharyngeal(NP) swabs in vial transport medium  Result Value Ref Range   SARS Coronavirus 2 by RT PCR NEGATIVE NEGATIVE    Comment: (NOTE) SARS-CoV-2 target nucleic acids are NOT DETECTED.  The SARS-CoV-2  RNA is generally detectable in upper respiratory specimens during the acute phase of infection. The lowest concentration of SARS-CoV-2 viral copies this assay can detect is 138 copies/mL. A negative result does not preclude SARS-Cov-2 infection and should not be used as the sole basis for treatment or other patient management decisions. A negative result may occur with  improper specimen collection/handling, submission of specimen other than nasopharyngeal swab, presence of viral mutation(s) within the areas targeted by this assay, and inadequate number of viral copies(<138 copies/mL). A negative result must be combined with clinical observations, patient history, and epidemiological information. The expected result is Negative.  Fact Sheet for Patients:  EntrepreneurPulse.com.au  Fact Sheet for Healthcare Providers:  IncredibleEmployment.be  This test is no t yet approved or cleared by the [place] FDA and  has been authorized for detection and/or diagnosis of SARS-CoV-2 by FDA under an Emergency Use Authorization (EUA). This EUA will remain  in effect (meaning this test can be  used) for the duration of the COVID-19 declaration under Section 564(b)(1) of the Act, 21 U.S.C.section 360bbb-3(b)(1), unless the authorization is terminated  or revoked sooner.       Influenza A by PCR NEGATIVE NEGATIVE   Influenza B by PCR NEGATIVE NEGATIVE    Comment: (NOTE) The Xpert Xpress SARS-CoV-2/FLU/RSV plus assay is intended as an aid in the diagnosis of influenza from Nasopharyngeal swab specimens and should not be used as a sole basis for treatment. Nasal washings and aspirates are unacceptable for Xpert Xpress SARS-CoV-2/FLU/RSV testing.  Fact Sheet for Patients: EntrepreneurPulse.com.au  Fact Sheet for Healthcare Providers: IncredibleEmployment.be  This test is not yet approved or cleared by the [place] FDA and has been authorized for detection and/or diagnosis of SARS-CoV-2 by FDA under an Emergency Use Authorization (EUA). This EUA will remain in effect (meaning this test can be used) for the duration of the COVID-19 declaration under Section 564(b)(1) of the Act, 21 U.S.C. section 360bbb-3(b)(1), unless the authorization is terminated or revoked.  Performed at [facility], [place] [address] [place], [place] [address]   Rapid urine drug screen (hospital performed)     Status: None   Collection Time: 03/18/21 11:04 PM  Result Value Ref Range   Opiates NONE DETECTED NONE DETECTED   Cocaine NONE DETECTED NONE DETECTED   Benzodiazepines NONE DETECTED NONE DETECTED   Amphetamines NONE DETECTED NONE DETECTED   Tetrahydrocannabinol NONE DETECTED NONE DETECTED   Barbiturates NONE DETECTED NONE DETECTED    Comment: (NOTE) DRUG SCREEN FOR MEDICAL PURPOSES ONLY.  IF CONFIRMATION IS NEEDED FOR ANY PURPOSE, NOTIFY LAB WITHIN 5 DAYS.  LOWEST DETECTABLE LIMITS FOR URINE DRUG SCREEN Drug Class                     Cutoff (ng/mL) Amphetamine and metabolites    1000 Barbiturate and metabolites    200 Benzodiazepine                  283 Tricyclics and metabolites     300 Opiates and metabolites        300 Cocaine and metabolites        300 THC                            50 Performed at [facility], [place] [address], [place],  [address]   Comprehensive metabolic panel     Status: Abnormal   Collection Time:  03/18/21 11:09 PM  Result Value Ref Range   Sodium 136 135 - 145 mmol/L   Potassium 4.3 3.5 - 5.1 mmol/L   Chloride 106 98 - 111 mmol/L   CO2 22 22 - 32 mmol/L   Glucose, Bld 119 (H) 70 - 99 mg/dL    Comment: Glucose reference range applies only to samples taken after fasting for at least 8 hours.   BUN 18 6 - 20 mg/dL   Creatinine, Ser 1.31 (H) 0.61 - 1.24 mg/dL   Calcium 9.4 8.9 - 10.3 mg/dL   Total Protein 7.1 6.5 - 8.1 g/dL   Albumin 3.7 3.5 - 5.0 g/dL   AST 28 15 - 41 U/L   ALT 39 0 - 44 U/L   Alkaline Phosphatase 92 38 - 126 U/L   Total Bilirubin 1.0 0.3 - 1.2 mg/dL   GFR, Estimated >60 >60 mL/min    Comment: (NOTE) Calculated using the CKD-EPI Creatinine Equation (2021)    Anion gap 8 5 - 15    Comment: Performed at [facility] [address] [place], [place] [address]  Ethanol     Status: None   Collection Time: 03/18/21 11:09 PM  Result Value Ref Range   Alcohol, Ethyl (B) <10 <10 mg/dL    Comment: (NOTE) Lowest detectable limit for serum alcohol is 10 mg/dL.  For medical purposes only. Performed at [facility], [place] [address], [place], [place] [address]   Salicylate level     Status: Abnormal   Collection Time: 03/18/21 11:09 PM  Result Value Ref Range   Salicylate Lvl <8.1 (L) 7.0 - 30.0 mg/dL    Comment: Performed at [facility] [address] [place], [place] [address]  Acetaminophen level     Status: Abnormal   Collection Time: 03/18/21 11:09 PM  Result Value Ref Range   Acetaminophen (Tylenol), Serum <10 (L) 10 - 30 ug/mL    Comment: (NOTE) Therapeutic concentrations vary significantly. A range of 10-30 ug/mL  may be an  effective concentration for many patients. However, some  are best treated at concentrations outside of this range. Acetaminophen concentrations >150 ug/mL at 4 hours after ingestion  and >50 ug/mL at 12 hours after ingestion are often associated with  toxic reactions.  Performed at [facility], [place] [address] [place], [place] [address]   cbc     Status: Abnormal   Collection Time: 03/18/21 11:09 PM  Result Value Ref Range   WBC 7.6 4.0 - 10.5 K/uL   RBC 5.73 4.22 - 5.81 MIL/uL   Hemoglobin 14.5 13.0 - 17.0 g/dL   HCT 45.4 39.0 - 52.0 %   MCV 79.2 (L) 80.0 - 100.0 fL   MCH 25.3 (L) 26.0 - 34.0 pg   MCHC 31.9 30.0 - 36.0 g/dL   RDW 17.7 (H) 11.5 - 15.5 %   Platelets 173 150 - 400 K/uL    Comment: REPEATED TO VERIFY   nRBC 0.0 0.0 - 0.2 %    Comment: Performed at [facility], [place] [address], [place], [place] [address]    Medications:  Current Facility-Administered Medications  Medication Dose Route Frequency Provider Last Rate Last Admin  . acetaminophen (TYLENOL) tablet 650 mg  650 mg Oral Q4H PRN Petrucelli, Samantha R, PA-C      . atorvastatin (LIPITOR) tablet 10 mg  10 mg Oral Daily Petrucelli, Samantha R, PA-C   10 mg at 03/19/21 0916  . brimonidine (  ALPHAGAN) 0.2 % ophthalmic solution 1 drop  1 drop Both Eyes BID Petrucelli, Samantha R, PA-C      . dapagliflozin propanediol (FARXIGA) tablet 10 mg  10 mg Oral Daily Petrucelli, Samantha R, PA-C   10 mg at 03/19/21 0916  . digoxin (LANOXIN) tablet 125 mcg  125 mcg Oral Daily Petrucelli, Samantha R, PA-C   125 mcg at 03/19/21 0916  . furosemide (LASIX) tablet 80 mg  80 mg Oral Daily Petrucelli, Samantha R, PA-C   80 mg at 03/19/21 0915  . levothyroxine (SYNTHROID) tablet 100 mcg  100 mcg Oral QAC breakfast Petrucelli, Samantha R, PA-C   100 mcg at 03/19/21 0650  . melatonin tablet 3 mg  3 mg Oral QHS Petrucelli, Samantha R, PA-C      . metoprolol succinate (TOPROL-XL) 24 hr tablet 75 mg  75 mg Oral Daily  Petrucelli, Samantha R, PA-C   75 mg at 03/19/21 0916  . ondansetron (ZOFRAN) tablet 4 mg  4 mg Oral Q8H PRN Petrucelli, Samantha R, PA-C      . pantoprazole (PROTONIX) EC tablet 40 mg  40 mg Oral QAC breakfast Petrucelli, Samantha R, PA-C   40 mg at 03/19/21 0907  . polyethylene glycol (MIRALAX / GLYCOLAX) packet 17 g  17 g Oral BID PRN Petrucelli, Samantha R, PA-C      . risperiDONE (RISPERDAL) tablet 0.5 mg  0.5 mg Oral QHS Petrucelli, Samantha R, PA-C      . sacubitril-valsartan (ENTRESTO) 24-26 mg per tablet  1 tablet Oral BID Petrucelli, Samantha R, PA-C   1 tablet at 03/19/21 0916  . senna (SENOKOT) tablet 17.2 mg  2 tablet Oral BID Petrucelli, Samantha R, PA-C   17.2 mg at 03/19/21 0916  . spironolactone (ALDACTONE) tablet 12.5 mg  12.5 mg Oral Daily Petrucelli, Samantha R, PA-C   12.5 mg at 03/19/21 0916  . tamsulosin (FLOMAX) capsule 0.4 mg  0.4 mg Oral q AM Petrucelli, Samantha R, PA-C   0.4 mg at 03/19/21 1937   Current Outpatient Medications  Medication Sig Dispense Refill  . acetaminophen (TYLENOL) 325 MG tablet Take 650 mg by mouth every 6 (six) hours as needed for moderate pain.    Marland Kitchen atorvastatin (LIPITOR) 10 MG tablet Take 10 mg by mouth daily.    . blood glucose meter kit and supplies KIT Dispense based on patient and insurance preference. Use up to four times daily as directed. (FOR ICD-9 250.00, 250.01). 1 each 0  . brimonidine (ALPHAGAN) 0.2 % ophthalmic solution Place 1 drop into both eyes 2 (two) times daily.     . dapagliflozin propanediol (FARXIGA) 10 MG TABS tablet Take 1 tablet (10 mg total) by mouth daily. 30 tablet 3  . digoxin (LANOXIN) 0.125 MG tablet Take 1 tablet (125 mcg total) by mouth daily. (Patient taking differently: Take 0.125 mg by mouth daily.) 30 tablet 11  . furosemide (LASIX) 80 MG tablet TAKE 1 TABLET (80 MG TOTAL) BY MOUTH TWO TIMES DAILY. (Patient taking differently: Take 80 mg by mouth daily.) 60 tablet 1  . Insulin Pen Needle (PEN NEEDLES 29GX1/2")  29G X 12MM MISC 1 each by Does not apply route daily. 100 each 0  . Insulin Pen Needle 32G X 4 MM MISC USE AS DIRECTED. 100 each 0  . levothyroxine (SYNTHROID) 100 MCG tablet Take 100 mcg by mouth daily before breakfast.    . melatonin 3 MG TABS tablet Take 3 mg by mouth at bedtime.    . metoprolol  succinate (TOPROL-XL) 25 MG 24 hr tablet TAKE 3 TABLETS (75 MG TOTAL) BY MOUTH DAILY. (Patient taking differently: Take 75 mg by mouth daily.) 90 tablet 1  . pantoprazole (PROTONIX) 40 MG tablet Take 40 mg by mouth daily before breakfast.    . polyethylene glycol (MIRALAX / GLYCOLAX) 17 g packet Take 17 g by mouth 2 (two) times daily. 14 each 0  . potassium chloride SA (KLOR-CON) 20 MEQ tablet TAKE 2 TABLETS (40 MEQ TOTAL) BY MOUTH DAILY. (Patient taking differently: Take 40 mEq by mouth daily.) 60 tablet 1  . risperiDONE (RISPERDAL) 0.5 MG tablet Take 1 tablet (0.5 mg total) by mouth at bedtime. 30 tablet 0  . sacubitril-valsartan (ENTRESTO) 24-26 MG TAKE 1 TABLET BY MOUTH TWO TIMES DAILY. 60 tablet 0  . senna (SENOKOT) 8.6 MG TABS tablet Take 2 tablets (17.2 mg total) by mouth 2 (two) times daily. 120 tablet 0  . spironolactone (ALDACTONE) 25 MG tablet Take 0.5 tablets (12.5 mg total) by mouth daily. 30 tablet 0  . tamsulosin (FLOMAX) 0.4 MG CAPS capsule Take 0.4 mg by mouth in the morning.    . traMADol (ULTRAM) 50 MG tablet TAKE 1-2 TABLETS (50-100 MG TOTAL) BY MOUTH EVERY TWELVE HOURS AS NEEDED (MILD PAIN). (Patient taking differently: Take 50-100 mg by mouth every 12 (twelve) hours as needed for moderate pain.) 12 tablet 0  . polyethylene glycol powder (GLYCOLAX/MIRALAX) 17 GM/SCOOP powder TAKE 17 G BY MOUTH 2 (TWO) TIMES DAILY. (Patient taking differently: Take 17 g by mouth in the morning and at bedtime.) 510 g 0    Musculoskeletal: Strength & Muscle Tone: within normal limits Gait & Station: Unable to assess Patient leans: N/A  Psychiatric Specialty Exam: Physical Exam Vitals and nursing  note reviewed.  Constitutional:      Appearance: He is well-developed.  HENT:     Head: Normocephalic.  Cardiovascular:     Rate and Rhythm: Normal rate.  Pulmonary:     Effort: Pulmonary effort is normal.  Neurological:     Mental Status: He is alert and oriented to person, place, and time.  Psychiatric:        Attention and Perception: Attention and perception normal.        Mood and Affect: Mood and affect normal.        Speech: Speech normal.        Behavior: Behavior normal. Behavior is cooperative.        Thought Content: Thought content includes suicidal ideation.        Cognition and Memory: Cognition and memory normal.        Judgment: Judgment normal.     Review of Systems  Constitutional: Negative.   HENT: Negative.   Eyes: Negative.   Respiratory: Negative.   Cardiovascular: Negative.   Gastrointestinal: Negative.   Genitourinary: Negative.   Musculoskeletal: Negative.   Skin: Negative.   Neurological: Negative.   Psychiatric/Behavioral: Positive for suicidal ideas.    Blood pressure 101/66, pulse (!) 104, temperature 98.2 F (36.8 C), temperature source Oral, resp. rate (!) 21, SpO2 100 %.There is no height or weight on file to calculate BMI.  General Appearance: Casual  Eye Contact:  Good  Speech:  Clear and Coherent and Normal Rate  Volume:  Normal  Mood:  Euthymic  Affect:  Appropriate and Congruent  Thought Process:  Coherent, Goal Directed and Descriptions of Associations: Intact  Orientation:  Full (Time, Place, and Person)  Thought Content:  Logical  Suicidal Thoughts:  Yes.  without intent/plan  Homicidal Thoughts:  No  Memory:  Immediate;   Good Recent;   Good Remote;   Good  Judgement:  Good  Insight:  Fair  Psychomotor Activity:  Normal  Concentration:  Concentration: Good and Attention Span: Good  Recall:  Good  Fund of Knowledge:  Good  Language:  Good  Akathisia:  No  Handed:  Right  AIMS (if indicated):     Assets:  Communication  Skills Desire for Improvement Financial Resources/Insurance Housing Intimacy Leisure Time Physical Health Resilience Social Support  ADL's:  Intact  Cognition:  WNL  Sleep:        Treatment Plan Summary: Patient reviewed with Dr. Serafina Mitchell. Plan Patient cleared by psychiatry.  Follow-up with outpatient psychiatry at [facility] behavioral health. Continue current medications. Social work consult initiated at patient's request.  Disposition: No evidence of imminent risk to self or others at present.   Patient does not meet criteria for psychiatric inpatient admission. Supportive therapy provided about ongoing stressors. Discussed crisis plan, support from social network, calling 911, coming to the Emergency Department, and calling Suicide Hotline.  This service was provided via telemedicine using a 2-way, interactive audio and video technology.  Names of all persons participating in this telemedicine service and their role in this encounter. Name: Corky Crafts Role: Patient  Name: Charles [name] Role: FNP  Name: Dr. Serafina Mitchell Role: Psychiatry    Lucky Rathke, FNP 03/19/2021 9:45 AM

## 2021-03-20 ENCOUNTER — Emergency Department (HOSPITAL_COMMUNITY)
Admission: EM | Admit: 2021-03-20 | Discharge: 2021-03-21 | Disposition: A | Discharge status: Home / Self Care | Payer: Medicaid Other | Attending: Emergency Medicine | Admitting: Emergency Medicine

## 2021-03-20 ENCOUNTER — Encounter (HOSPITAL_COMMUNITY): Payer: Self-pay | Admitting: Emergency Medicine

## 2021-03-20 DIAGNOSIS — R45851 Suicidal ideations: Secondary | ICD-10-CM

## 2021-03-20 DIAGNOSIS — E119 Type 2 diabetes mellitus without complications: Secondary | ICD-10-CM | POA: Diagnosis not present

## 2021-03-20 DIAGNOSIS — Z5900 Homelessness unspecified: Secondary | ICD-10-CM

## 2021-03-20 DIAGNOSIS — Z79899 Other long term (current) drug therapy: Secondary | ICD-10-CM | POA: Diagnosis not present

## 2021-03-20 DIAGNOSIS — I110 Hypertensive heart disease with heart failure: Secondary | ICD-10-CM | POA: Diagnosis not present

## 2021-03-20 DIAGNOSIS — F333 Major depressive disorder, recurrent, severe with psychotic symptoms: Secondary | ICD-10-CM | POA: Diagnosis not present

## 2021-03-20 DIAGNOSIS — Z8616 Personal history of COVID-19: Secondary | ICD-10-CM | POA: Insufficient documentation

## 2021-03-20 DIAGNOSIS — F17210 Nicotine dependence, cigarettes, uncomplicated: Secondary | ICD-10-CM | POA: Insufficient documentation

## 2021-03-20 DIAGNOSIS — F209 Schizophrenia, unspecified: Secondary | ICD-10-CM | POA: Diagnosis present

## 2021-03-20 DIAGNOSIS — Z794 Long term (current) use of insulin: Secondary | ICD-10-CM | POA: Insufficient documentation

## 2021-03-20 DIAGNOSIS — Y900 Blood alcohol level of less than 20 mg/100 ml: Secondary | ICD-10-CM | POA: Diagnosis not present

## 2021-03-20 DIAGNOSIS — I5033 Acute on chronic diastolic (congestive) heart failure: Secondary | ICD-10-CM | POA: Diagnosis not present

## 2021-03-20 MED ORDER — DAPAGLIFLOZIN PROPANEDIOL 10 MG PO TABS
10.0000 mg | ORAL_TABLET | Freq: Every day | ORAL | Status: DC
  Administered 2021-03-20: 10 mg via ORAL
  Filled 2021-03-20: qty 1

## 2021-03-20 MED ORDER — SPIRONOLACTONE 12.5 MG HALF TABLET
12.5000 mg | ORAL_TABLET | Freq: Every day | ORAL | Status: DC
  Administered 2021-03-20: 12.5 mg via ORAL
  Filled 2021-03-20: qty 1

## 2021-03-20 MED ORDER — SACUBITRIL-VALSARTAN 24-26 MG PO TABS
1.0000 | ORAL_TABLET | Freq: Two times a day (BID) | ORAL | Status: DC
  Administered 2021-03-20: 1 via ORAL
  Filled 2021-03-20: qty 1

## 2021-03-20 MED ORDER — ATORVASTATIN CALCIUM 10 MG PO TABS
10.0000 mg | ORAL_TABLET | Freq: Every day | ORAL | Status: DC
  Administered 2021-03-20: 10 mg via ORAL
  Filled 2021-03-20: qty 1

## 2021-03-20 MED ORDER — DIGOXIN 125 MCG PO TABS
125.0000 ug | ORAL_TABLET | Freq: Every day | ORAL | Status: DC
  Administered 2021-03-20: 125 ug via ORAL
  Filled 2021-03-20: qty 1

## 2021-03-20 MED ORDER — LEVOTHYROXINE SODIUM 100 MCG PO TABS
100.0000 ug | ORAL_TABLET | Freq: Every day | ORAL | Status: DC
  Administered 2021-03-20: 100 ug via ORAL
  Filled 2021-03-20: qty 1

## 2021-03-20 MED ORDER — TRAMADOL HCL 50 MG PO TABS: 50.0000 mg | ORAL_TABLET | Freq: Two times a day (BID) | ORAL | Status: DC | PRN

## 2021-03-20 MED ORDER — RISPERIDONE 0.5 MG PO TABS: 0.5000 mg | ORAL_TABLET | Freq: Every day | ORAL | Status: DC

## 2021-03-20 MED ORDER — BRIMONIDINE TARTRATE 0.2 % OP SOLN
1.0000 [drp] | Freq: Two times a day (BID) | OPHTHALMIC | Status: DC
  Administered 2021-03-20: 1 [drp] via OPHTHALMIC
  Filled 2021-03-20: qty 5

## 2021-03-20 MED ORDER — HYDROCOD POLST-CPM POLST ER 10-8 MG/5ML PO SUER
5.0000 mL | Freq: Once | ORAL | Status: AC
  Administered 2021-03-20: 5 mL via ORAL
  Filled 2021-03-20: qty 5

## 2021-03-20 MED ORDER — POTASSIUM CHLORIDE CRYS ER 20 MEQ PO TBCR
40.0000 meq | EXTENDED_RELEASE_TABLET | Freq: Every day | ORAL | Status: DC
  Administered 2021-03-20: 40 meq via ORAL
  Filled 2021-03-20: qty 2

## 2021-03-20 MED ORDER — METOPROLOL SUCCINATE ER 50 MG PO TB24
75.0000 mg | ORAL_TABLET | Freq: Every day | ORAL | Status: DC
  Administered 2021-03-20: 75 mg via ORAL
  Filled 2021-03-20: qty 2

## 2021-03-20 MED ORDER — FUROSEMIDE 40 MG PO TABS
80.0000 mg | ORAL_TABLET | Freq: Every day | ORAL | Status: DC
  Administered 2021-03-20: 80 mg via ORAL
  Filled 2021-03-20: qty 2

## 2021-03-20 MED ORDER — PANTOPRAZOLE SODIUM 40 MG PO TBEC
40.0000 mg | DELAYED_RELEASE_TABLET | Freq: Every day | ORAL | Status: DC
  Administered 2021-03-20: 40 mg via ORAL
  Filled 2021-03-20: qty 1

## 2021-03-20 MED ORDER — TAMSULOSIN HCL 0.4 MG PO CAPS
0.4000 mg | ORAL_CAPSULE | Freq: Every morning | ORAL | Status: DC
  Administered 2021-03-20: 0.4 mg via ORAL
  Filled 2021-03-20: qty 1

## 2021-03-20 NOTE — Progress Notes (Addendum)
.   Transition of Care [facility]) - Emergency Department Mini Assessment   Patient Details  Name: Charles [name] MRN: [id number] Date of Birth: 14-Oct-1966  [facility] CM/SW Contact:    Erenest Rasher, RN Phone Number: 03/20/2021, 4:07 pm  Clinical Narrative: TOC CM spoke to pt and states he is homeless. States his check for May was kept by SS due to money owed to government. States he left his previous ALF due to they were taking his money. He was suppose to get a $55 discharge check from his check. Pt states he left the ALF because he felt they were stealing his money and staff was not helpful. TOC CM contacted [facility]. Pt states he on waiting list. They have a waiting list and not beds not available. [place] [facility], and [facility] and they are full. Contacted [facility] in [place] and they have bed. TOC CM had pt sign transportation waiver. [facility] transportation waiver scanned. [facility]ed [facility] for transportation.   6:00 pm Pt was sent back from [facility] due to not being able to climb up to top bunk. He was returned back to hospital. [facility] CM spoke to pt and no shelter available in the community.    ED Mini Assessment: What brought you to the Emergency Department? : homeless  Barriers to Discharge: No Barriers Identified  Barrier interventions: needs shelter  Means of departure: Hospital Transport  Interventions which prevented an admission or readmission: Transportation Screening,Homeless Screening    Patient Contact and Communications        ,                 Admission diagnosis:  SI Patient Active Problem List   Diagnosis Date Noted  . Passive suicidal ideations 03/20/2021  . MDD (major depressive disorder), recurrent, severe, with psychosis ([place]) 02/28/2021  . Acute hypoxemic respiratory failure ([place]) 01/13/2021  . Acute on chronic heart failure with reduced ejection fraction and diastolic dysfunction ([place]) 01/12/2021   . Schizophrenia ([place]) 01/04/2021  . Diarrhea 01/04/2021  . Pulmonary edema   . Mediastinal adenopathy 12/18/2020  . Adjustment disorder with mixed anxiety and depressed mood 10/30/2020  . Mediastinal lymphadenopathy 10/11/2020  . Shortness of breath 10/11/2020  . Sinus tachycardia 10/11/2020  . Night sweats 10/11/2020  . DM2 (diabetes mellitus, type 2) ([place]) 10/11/2020  . 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 Hypertension 09/25/2020  . BPH (benign prostatic hyperplasia) 09/25/2020  . Insomnia 09/25/2020   PCP:  [name], MD Pharmacy:   [facility] [place], [place] [address] [place] [address] Phone: [phone] Fax: [phone]

## 2021-03-20 NOTE — BH Assessment (Addendum)
[place] Assessment Progress Note  Per Oneida Alar, NP, this voluntary pt does not require psychiatric hospitalization at this time.  Pt is psychiatrically cleared.  No behavioral health referrals are indicated for pt at this time, but a TOC consult will be ordered to address pt's psychosocial needs.  EDP Dene Gentry, MD and pt's nurse, Lovena Le, have been notified.  Jalene Mullet, [place] Triage Specialist [phone]

## 2021-03-20 NOTE — ED Provider Notes (Signed)
Emergency Medicine Provider Triage Evaluation Note  Charles [name] , a 55 y.o. male  was evaluated in triage.  Pt complains of SI, dc from hospital for same earlier today. Called mobile crisis today and reports plan to hang self with means to do so.  Review of Systems  Positive: SI Negative:   Physical Exam  Ht 5\' 9"  (1.753 m)   Wt 110.2 kg   BMI 35.88 kg/m  Gen:   Awake, no distress   Resp:  Normal effort  MSK:   Moves extremities without difficulty  Other:  Ambulatory with walker without assistance   Medical Decision Making  Medically screening exam initiated at 7:36 PM.  Appropriate orders placed.  [name] was informed that the remainder of the evaluation will be completed by another provider, this initial triage assessment does not replace that evaluation, and the importance of remaining in the ED until their evaluation is complete.     Tacy Learn, PA-C 03/20/21 1948    Daleen Bo, MD 03/20/21 2307

## 2021-03-20 NOTE — ED Provider Notes (Signed)
[facility] DEPT Provider Note   CSN: [id number] Arrival date & time: 03/20/21  1928     History Chief Complaint  Patient presents with  . Suicidal    Charles [name] is a 55 y.o. male.  Patient presents to the emergency department with a chief complaint of suicidal thoughts.  He states that "either institutionalized me or let me die."  He states that if he leaves he is going to kill himself.  Despite questioning him, patient delivers no further history.  Level 5 caveat applies secondary to psychiatric condition.  The history is provided by the patient. No language interpreter was used.       Past Medical History:  Diagnosis Date  . Arthritis    lower back  . Bipolar 1 disorder ([place])   . Cardiomyopathy, nonischemic ([place])    no CAD 10/14/20 cath, EF 20-25% by 10/11/20 echo  . CHF (congestive heart failure) ([place])   . COVID 10/2020  . Depression   . DM2 (diabetes mellitus, type 2) ([place])   . HTN (hypertension)   . Myocardial infarction [facility])     Patient Active Problem List   Diagnosis Date Noted  . Passive suicidal ideations 03/20/2021  . MDD (major depressive disorder), recurrent, severe, with psychosis ([place]) 02/28/2021  . Acute hypoxemic respiratory failure ([place]) 01/13/2021  . Acute on chronic heart failure with reduced ejection fraction and diastolic dysfunction ([place]) 01/12/2021  . Schizophrenia ([place]) 01/04/2021  . Diarrhea 01/04/2021  . Pulmonary edema   . Mediastinal adenopathy 12/18/2020  . Adjustment disorder with mixed anxiety and depressed mood 10/30/2020  . Mediastinal lymphadenopathy 10/11/2020  . Shortness of breath 10/11/2020  . Sinus tachycardia 10/11/2020  . Night sweats 10/11/2020  . DM2 (diabetes mellitus, type 2) ([place]) 10/11/2020  . 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 Hypertension 09/25/2020  . BPH (benign prostatic hyperplasia) 09/25/2020  . Insomnia 09/25/2020     Past Surgical History:  Procedure Laterality Date  . BACK SURGERY  01/2020  . CARDIAC CATHETERIZATION  10/2020  . CHOLECYSTECTOMY    . HERNIA REPAIR    . INTERCOSTAL NERVE BLOCK Left 12/18/2020   Procedure: INTERCOSTAL NERVE BLOCK;  Surgeon: Melrose Nakayama, MD;  Location: [facility];  Service: Thoracic;  Laterality: Left;  . LYMPH NODE BIOPSY Left 12/18/2020   Procedure: LYMPH NODE BIOPSY;  Surgeon: Melrose Nakayama, MD;  Location: [facility];  Service: Thoracic;  Laterality: Left;  . RIGHT/LEFT HEART CATH AND CORONARY ANGIOGRAPHY N/A 10/14/2020   Procedure: RIGHT/LEFT HEART CATH AND CORONARY ANGIOGRAPHY;  Surgeon: Jettie Booze, MD;  Location: [facility];  Service: Cardiovascular;  Laterality: N/A;  . SPINAL FIXATION SURGERY  02/26/2020  . XI ROBOTIC ASSISTED THORASCOPY FOR BIOPSY AP WINDOW LYMPH NODES (Left)  12/18/2020       Family History  Problem Relation Age of Onset  . Hypertension Mother   . Hyperlipidemia Mother   . Stroke Mother   . Cancer Mother   . Hypertension Father   . Hyperlipidemia Father   . Healthy Sister     Social History   Tobacco Use  . Smoking status: Current Some Day Smoker    Packs/day: 0.25    Types: Cigarettes  . Smokeless tobacco: Never Used  . Tobacco comment: occasionally  Vaping Use  . Vaping Use: Never used  Substance Use Topics  . Alcohol use: Never  . Drug use: Not Currently    Types:  Cocaine    Home Medications Prior to Admission medications   Medication Sig Start Date End Date Taking? Authorizing Provider  acetaminophen (TYLENOL) 325 MG tablet Take 650 mg by mouth every 6 (six) hours as needed for moderate pain.    [provider]  atorvastatin (LIPITOR) 10 MG tablet Take 10 mg by mouth daily. 09/30/20   [provider]  blood glucose meter kit and supplies KIT Dispense based on patient and insurance preference. Use up to four times daily as directed. (FOR ICD-9 250.00, 250.01). 10/23/20    Barb Merino, MD  brimonidine (ALPHAGAN) 0.2 % ophthalmic solution Place 1 drop into both eyes 2 (two) times daily.     [provider]  dapagliflozin propanediol (FARXIGA) 10 MG TABS tablet Take 1 tablet (10 mg total) by mouth daily. 10/23/20   Barb Merino, MD  digoxin (LANOXIN) 0.125 MG tablet Take 1 tablet (125 mcg total) by mouth daily. Patient taking differently: Take 0.125 mg by mouth daily. 12/29/20 12/29/21  Clegg, Amy D, NP  furosemide (LASIX) 80 MG tablet TAKE 1 TABLET (80 MG TOTAL) BY MOUTH TWO TIMES DAILY. Patient taking differently: Take 80 mg by mouth daily. 12/23/20 12/23/21  Gold, Wilder Glade, PA-C  Insulin Pen Needle (PEN NEEDLES 29GX1/2") 29G X 12MM MISC 1 each by Does not apply route daily. 10/23/20   Barb Merino, MD  Insulin Pen Needle 32G X 4 MM MISC USE AS DIRECTED. 10/23/20 10/23/21  Barb Merino, MD  levothyroxine (SYNTHROID) 100 MCG tablet Take 100 mcg by mouth daily before breakfast.    [provider]  melatonin 3 MG TABS tablet Take 3 mg by mouth at bedtime. 09/08/20   [provider]  metoprolol succinate (TOPROL-XL) 25 MG 24 hr tablet TAKE 3 TABLETS (75 MG TOTAL) BY MOUTH DAILY. Patient taking differently: Take 75 mg by mouth daily. 01/16/21 01/16/22  [name], MD  pantoprazole (PROTONIX) 40 MG tablet Take 40 mg by mouth daily before breakfast.    [provider]  polyethylene glycol (MIRALAX / GLYCOLAX) 17 g packet Take 17 g by mouth 2 (two) times daily. 10/23/20   Barb Merino, MD  polyethylene glycol powder (GLYCOLAX/MIRALAX) 17 GM/SCOOP powder TAKE 17 G BY MOUTH 2 (TWO) TIMES DAILY. Patient taking differently: Take 17 g by mouth in the morning and at bedtime. 10/23/20 10/23/21  Barb Merino, MD  potassium chloride SA (KLOR-CON) 20 MEQ tablet TAKE 2 TABLETS (40 MEQ TOTAL) BY MOUTH DAILY. Patient taking differently: Take 40 mEq by mouth daily. 12/23/20 12/23/21  Jadene Pierini E, PA-C  risperiDONE (RISPERDAL) 0.5 MG tablet  Take 1 tablet (0.5 mg total) by mouth at bedtime. 12/14/20   Lucrezia Starch, MD  sacubitril-valsartan (ENTRESTO) 24-26 MG TAKE 1 TABLET BY MOUTH TWO TIMES DAILY. 10/23/20 10/23/21  Barb Merino, MD  senna (SENOKOT) 8.6 MG TABS tablet Take 2 tablets (17.2 mg total) by mouth 2 (two) times daily. 10/23/20   Barb Merino, MD  spironolactone (ALDACTONE) 25 MG tablet Take 0.5 tablets (12.5 mg total) by mouth daily. 01/07/21   Madalyn Rob, MD  tamsulosin (FLOMAX) 0.4 MG CAPS capsule Take 0.4 mg by mouth in the morning. 09/08/20   [provider]  traMADol (ULTRAM) 50 MG tablet TAKE 1-2 TABLETS (50-100 MG TOTAL) BY MOUTH EVERY TWELVE HOURS AS NEEDED (MILD PAIN). Patient taking differently: Take 50-100 mg by mouth every 12 (twelve) hours as needed for moderate pain. 12/23/20 06/12/2021  John Giovanni, PA-C    Allergies  Ibuprofen and Penicillins  Review of Systems   Review of Systems  Unable to perform ROS: Psychiatric disorder    Physical Exam Updated Vital Signs BP (!) 112/94 (BP Location: Left Arm)   Pulse (!) 116   Temp 98 F (36.7 C) (Oral)   Resp 20   Ht 5' 9"  (1.753 m)   Wt 108.9 kg   SpO2 99%   BMI 35.44 kg/m   Physical Exam Vitals and nursing note reviewed.  Constitutional:      General: He is not in acute distress.    Appearance: He is well-developed. He is not ill-appearing.  HENT:     Head: Normocephalic and atraumatic.  Eyes:     Conjunctiva/sclera: Conjunctivae normal.  Cardiovascular:     Rate and Rhythm: Normal rate.  Pulmonary:     Effort: Pulmonary effort is normal. No respiratory distress.  Abdominal:     General: There is no distension.  Musculoskeletal:     Cervical back: Neck supple.     Comments: Moves all extremities  Skin:    General: Skin is warm and dry.  Neurological:     Mental Status: He is alert and oriented to person, place, and time.  Psychiatric:        Mood and Affect: Mood normal.        Behavior: Behavior normal.     ED  Results / Procedures / Treatments   Labs (all labs ordered are listed, but only abnormal results are displayed) Labs Reviewed - No data to display  EKG None  Radiology DG Chest 2 View  Result Date: 03/18/2021 CLINICAL DATA:  Cough. EXAM: CHEST - 2 VIEW COMPARISON:  Radiographs and chest CT a 01/13/2021. PET CT 02/06/2021 FINDINGS: Similar cardiomegaly to prior exam. Small left pleural effusion again seen, grossly stable. No confluent airspace disease or pneumothorax. Mild chronic bronchitic change. Stable osseous structures. IMPRESSION: Stable cardiomegaly and small left pleural effusion. No acute findings. Electronically Signed   By: Keith Rake M.D.   On: 03/18/2021 23:49    Procedures Procedures   Medications Ordered in ED Medications - No data to display  ED Course  I have reviewed the triage vital signs and the nursing notes.  Pertinent labs & imaging results that were available during my care of the patient were reviewed by me and considered in my medical decision making (see chart for details).    MDM Rules/Calculators/A&P                          Patient here with suicidal thoughts.  He states either institutionalized me or let me die.  He does not provide further history.  Vitals and labs appear stable for TTS evaluation.  TTS recommends psychiatry reassessment in the morning. Final Clinical Impression(s) / ED Diagnoses Final diagnoses:  Suicidal thoughts    Rx / DC Orders ED Discharge Orders    None       Montine Circle, PA-C 03/21/21 [id number]    Ezequiel Essex, MD 03/21/21 [phone]

## 2021-03-20 NOTE — ED Notes (Signed)
Ambulated to lobby with walker and all belongings.

## 2021-03-20 NOTE — ED Notes (Signed)
TTS at bedside. 

## 2021-03-20 NOTE — ED Notes (Signed)
Called pharmacy, they are sending a dose of Flomax.

## 2021-03-20 NOTE — ED Notes (Signed)
Pt is resting comfortably.

## 2021-03-20 NOTE — ED Notes (Signed)
Pt given breakfast tray

## 2021-03-20 NOTE — Progress Notes (Signed)
TOC CSW has reached out to the following family care homes.  [facility]--Females only [facility]--No Beds Available [facility]--Bed Available/June 6  Pablo Stauffer Tarpley-Carter, MSW, LCSW-A Pronouns:  She, Her, [facility] ED Transitions of CareClinical Social Worker [email or web address] [phone]

## 2021-03-20 NOTE — ED Notes (Signed)
Pt resting quietly with eyes closed at this time. Pt in hall bed in direct line of sight of nurse station.

## 2021-03-20 NOTE — Consult Note (Signed)
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 male patient admitted with passive suicidal ideations. Per chart review, patient has several ED encounters within [facility] system over past 2.5 weeks; patient declined several placement options. Patient was psychiatrically cleared and discharged from [facility]; returned 2 hours later.  On assessment patient presents with blunt affect laying in bed. Patient states he went to placement hospital social worker referred him to upon discharge where he was told he did not have placement; says he began feeling hopeless and suicidal, no specific plan. Patient expressed frustration with not having secured housing; provider discussed prior attempts to place patient and his refusal to go secured placements. He denies any ACTT Team or other community supports. Patient now states he is willing to go to [facility]; denies any active homicidal or suicidal ideations, auditory or visual hallucinations at this time. [name] not actively psychotic and does not appear to be responding to any external/internal stimuli at this time. Patient does not meet inpatient psychiatric hospitalization criteria at this time; TOC/SW consult placed to address placement.   HPI:   Charles [name] is a 55 year old male who presented voluntarily to [facility] for passive suicidal ideations two hours after discharge and refusing placement. Patient has documented history of schizophrenia and reports prior hospitalization to [facility] and [facility]". Per chart  review, patient is currently receiving chemotherapy treatment for Hodgkin's Lymphoma; last treatment noted 03/12/21. Per chart review 02/27/21, patient was last housed at "[facility]"; 03/16/21 LCSW reached out to patient regarding [facility] in which he ultimately refused.   Past Psychiatric History:   -schizophrenia  Risk to Self:  pt denies Risk to Others:  pt denies Prior Inpatient Therapy:  yes Prior Outpatient Therapy:  yes  Past Medical History:  Past Medical History:  Diagnosis Date  . Arthritis    lower back  . Bipolar 1 disorder ([place])   . Cardiomyopathy, nonischemic ([place])    no CAD 10/14/20 cath, EF 20-25% by 10/11/20 echo  . CHF (congestive heart failure) ([place])   . COVID 10/2020  . Depression   . DM2 (diabetes mellitus, type 2) ([place])   . HTN (hypertension)   . Myocardial infarction [facility])     Past Surgical History:  Procedure Laterality Date  . BACK SURGERY  01/2020  . CARDIAC CATHETERIZATION  10/2020  . CHOLECYSTECTOMY    . HERNIA REPAIR    . INTERCOSTAL NERVE BLOCK Left 12/18/2020   Procedure: INTERCOSTAL NERVE BLOCK;  Surgeon: Melrose Nakayama, MD;  Location: [facility];  Service: Thoracic;  Laterality: Left;  . LYMPH NODE BIOPSY Left 12/18/2020   Procedure: LYMPH NODE BIOPSY;  Surgeon: Melrose Nakayama, MD;  Location: [facility];  Service: Thoracic;  Laterality: Left;  . RIGHT/LEFT HEART CATH AND CORONARY ANGIOGRAPHY N/A 10/14/2020   Procedure: RIGHT/LEFT HEART CATH AND CORONARY ANGIOGRAPHY;  Surgeon: Jettie Booze, MD;  Location: [facility];  Service: Cardiovascular;  Laterality: N/A;  . SPINAL FIXATION SURGERY  02/26/2020  . XI ROBOTIC ASSISTED THORASCOPY FOR BIOPSY AP WINDOW LYMPH NODES (Left)  12/18/2020   Family History:  Family History  Problem Relation Age of Onset  . Hypertension Mother   . Hyperlipidemia Mother   . Stroke Mother   . Cancer Mother   . Hypertension Father   . Hyperlipidemia Father   . Healthy Sister    Family  Psychiatric  History:   -not noted Social History:  Social History   Substance and Sexual Activity  Alcohol Use Never     Social History   Substance and Sexual Activity  Drug Use Not Currently  . Types: Cocaine    Social History   Socioeconomic History  . Marital status: Divorced    Spouse name: Not on file  . Number of children: 1  . Years of education: Not on file  . Highest education level: Not on file  Occupational History  . Not on file  Tobacco Use  . Smoking status: Current Some Day Smoker    Packs/day: 0.25    Types: Cigarettes  . Smokeless tobacco: Never Used  . Tobacco comment: occasionally  Vaping Use  . Vaping Use: Never used  Substance and Sexual Activity  . Alcohol use: Never  . Drug use: Not Currently    Types: Cocaine  . Sexual activity: Not Currently  Other Topics Concern  . Not on file  Social History Narrative  . Not on file   Social Determinants of Health   Financial Resource Strain: Low Risk   . Difficulty of Paying Living Expenses: Not hard at all  Food Insecurity: No Food Insecurity  . Worried About Charity fundraiser in the Last Year: Never true  . Ran Out of Food in the Last Year: Never true  Transportation Needs: No Transportation Needs  . Lack of Transportation (Medical): No  . Lack of Transportation (Non-Medical): No  Physical Activity: Insufficiently Active  . Days of Exercise per Week: 7 days  . Minutes of Exercise per Session: 10 min  Stress: No Stress Concern Present  . Feeling of Stress : Only a little  Social Connections: Not on file   Additional Social History:   Allergies:   Allergies  Allergen Reactions  . Ibuprofen Other (See Comments)    Unknown per MAR  . Penicillins Rash   Labs:  Results for orders placed or performed during the hospital encounter of 03/18/21 (from the past 48 hour(s))  Rapid urine drug screen (hospital performed)     Status: None   Collection Time: 03/18/21 11:04 PM  Result Value Ref  Range   Opiates NONE DETECTED NONE DETECTED   Cocaine NONE DETECTED NONE DETECTED   Benzodiazepines NONE DETECTED NONE DETECTED   Amphetamines NONE DETECTED NONE DETECTED   Tetrahydrocannabinol NONE DETECTED NONE DETECTED   Barbiturates NONE DETECTED NONE DETECTED    Comment: (NOTE) DRUG SCREEN FOR MEDICAL PURPOSES ONLY.  IF CONFIRMATION IS NEEDED FOR ANY PURPOSE, NOTIFY LAB WITHIN 5 DAYS.  LOWEST DETECTABLE LIMITS FOR URINE DRUG SCREEN Drug Class                     Cutoff (ng/mL) Amphetamine and metabolites    1000 Barbiturate and metabolites    200 Benzodiazepine                 409 Tricyclics and metabolites     300 Opiates and metabolites        300 Cocaine and metabolites        300 THC  50 Performed at [facility], [place] [address], [place], [place] [address]   Comprehensive metabolic panel     Status: Abnormal   Collection Time: 03/18/21 11:09 PM  Result Value Ref Range   Sodium 136 135 - 145 mmol/L   Potassium 4.3 3.5 - 5.1 mmol/L   Chloride 106 98 - 111 mmol/L   CO2 22 22 - 32 mmol/L   Glucose, Bld 119 (H) 70 - 99 mg/dL    Comment: Glucose reference range applies only to samples taken after fasting for at least 8 hours.   BUN 18 6 - 20 mg/dL   Creatinine, Ser 1.31 (H) 0.61 - 1.24 mg/dL   Calcium 9.4 8.9 - 10.3 mg/dL   Total Protein 7.1 6.5 - 8.1 g/dL   Albumin 3.7 3.5 - 5.0 g/dL   AST 28 15 - 41 U/L   ALT 39 0 - 44 U/L   Alkaline Phosphatase 92 38 - 126 U/L   Total Bilirubin 1.0 0.3 - 1.2 mg/dL   GFR, Estimated >60 >60 mL/min    Comment: (NOTE) Calculated using the CKD-EPI Creatinine Equation (2021)    Anion gap 8 5 - 15    Comment: Performed at [facility] [address], [place], [place] [address]  Ethanol     Status: None   Collection Time: 03/18/21 11:09 PM  Result Value Ref Range   Alcohol, Ethyl (B) <10 <10 mg/dL    Comment: (NOTE) Lowest detectable limit for serum alcohol is 10 mg/dL.  For medical  purposes only. Performed at [facility], [place] [address], [place], [place] [address]   Salicylate level     Status: Abnormal   Collection Time: 03/18/21 11:09 PM  Result Value Ref Range   Salicylate Lvl <5.1 (L) 7.0 - 30.0 mg/dL    Comment: Performed at [facility] [address] [place], [place] [address]  Acetaminophen level     Status: Abnormal   Collection Time: 03/18/21 11:09 PM  Result Value Ref Range   Acetaminophen (Tylenol), Serum <10 (L) 10 - 30 ug/mL    Comment: (NOTE) Therapeutic concentrations vary significantly. A range of 10-30 ug/mL  may be an effective concentration for many patients. However, some  are best treated at concentrations outside of this range. Acetaminophen concentrations >150 ug/mL at 4 hours after ingestion  and >50 ug/mL at 12 hours after ingestion are often associated with  toxic reactions.  Performed at [facility], [place] [address], [place], [place] [address]   cbc     Status: Abnormal   Collection Time: 03/18/21 11:09 PM  Result Value Ref Range   WBC 7.6 4.0 - 10.5 K/uL   RBC 5.73 4.22 - 5.81 MIL/uL   Hemoglobin 14.5 13.0 - 17.0 g/dL   HCT 45.4 39.0 - 52.0 %   MCV 79.2 (L) 80.0 - 100.0 fL   MCH 25.3 (L) 26.0 - 34.0 pg   MCHC 31.9 30.0 - 36.0 g/dL   RDW 17.7 (H) 11.5 - 15.5 %   Platelets 173 150 - 400 K/uL    Comment: REPEATED TO VERIFY   nRBC 0.0 0.0 - 0.2 %    Comment: Performed at [facility], [place] [address], [place],  [address]    Current Facility-Administered Medications  Medication Dose Route Frequency Provider Last Rate Last Admin  . atorvastatin (LIPITOR) tablet 10 mg  10 mg Oral Daily Muthersbaugh, Hannah, PA-C   10 mg at  03/20/21 1012  . brimonidine (ALPHAGAN) 0.2 % ophthalmic solution 1 drop  1 drop Both Eyes BID Muthersbaugh, Jarrett Soho, PA-C   1 drop at 03/20/21 1013  . dapagliflozin propanediol (FARXIGA) tablet 10 mg  10 mg Oral Daily Muthersbaugh, Hannah, PA-C   10 mg at 03/20/21 1013  .  digoxin (LANOXIN) tablet 125 mcg  125 mcg Oral Daily Muthersbaugh, Jarrett Soho, PA-C   125 mcg at 03/20/21 1013  . furosemide (LASIX) tablet 80 mg  80 mg Oral Daily Muthersbaugh, Hannah, PA-C   80 mg at 03/20/21 1012  . levothyroxine (SYNTHROID) tablet 100 mcg  100 mcg Oral QAC breakfast Muthersbaugh, Jarrett Soho, PA-C   100 mcg at 03/20/21 0517  . metoprolol succinate (TOPROL-XL) 24 hr tablet 75 mg  75 mg Oral Daily Muthersbaugh, Hannah, PA-C   75 mg at 03/20/21 1012  . pantoprazole (PROTONIX) EC tablet 40 mg  40 mg Oral QAC breakfast Muthersbaugh, Jarrett Soho, PA-C   40 mg at 03/20/21 1017  . potassium chloride SA (KLOR-CON) CR tablet 40 mEq  40 mEq Oral Daily Muthersbaugh, Jarrett Soho, PA-C   40 mEq at 03/20/21 1012  . risperiDONE (RISPERDAL) tablet 0.5 mg  0.5 mg Oral QHS Muthersbaugh, Hannah, PA-C      . sacubitril-valsartan (ENTRESTO) 24-26 mg per tablet  1 tablet Oral BID Muthersbaugh, Jarrett Soho, PA-C   1 tablet at 03/20/21 1013  . spironolactone (ALDACTONE) tablet 12.5 mg  12.5 mg Oral Daily Muthersbaugh, Hannah, PA-C   12.5 mg at 03/20/21 1013  . tamsulosin (FLOMAX) capsule 0.4 mg  0.4 mg Oral q AM Muthersbaugh, Hannah, PA-C   0.4 mg at 03/20/21 0655  . traMADol (ULTRAM) tablet 50-100 mg  50-100 mg Oral Q12H PRN Muthersbaugh, Jarrett Soho, PA-C       Current Outpatient Medications  Medication Sig Dispense Refill  . acetaminophen (TYLENOL) 325 MG tablet Take 650 mg by mouth every 6 (six) hours as needed for moderate pain.    Marland Kitchen atorvastatin (LIPITOR) 10 MG tablet Take 10 mg by mouth daily.    . blood glucose meter kit and supplies KIT Dispense based on patient and insurance preference. Use up to four times daily as directed. (FOR ICD-9 250.00, 250.01). 1 each 0  . brimonidine (ALPHAGAN) 0.2 % ophthalmic solution Place 1 drop into both eyes 2 (two) times daily.     . dapagliflozin propanediol (FARXIGA) 10 MG TABS tablet Take 1 tablet (10 mg total) by mouth daily. 30 tablet 3  . digoxin (LANOXIN) 0.125 MG tablet Take 1  tablet (125 mcg total) by mouth daily. (Patient taking differently: Take 0.125 mg by mouth daily.) 30 tablet 11  . furosemide (LASIX) 80 MG tablet TAKE 1 TABLET (80 MG TOTAL) BY MOUTH TWO TIMES DAILY. (Patient taking differently: Take 80 mg by mouth daily.) 60 tablet 1  . Insulin Pen Needle (PEN NEEDLES 29GX1/2") 29G X 12MM MISC 1 each by Does not apply route daily. 100 each 0  . Insulin Pen Needle 32G X 4 MM MISC USE AS DIRECTED. 100 each 0  . levothyroxine (SYNTHROID) 100 MCG tablet Take 100 mcg by mouth daily before breakfast.    . melatonin 3 MG TABS tablet Take 3 mg by mouth at bedtime.    . metoprolol succinate (TOPROL-XL) 25 MG 24 hr tablet TAKE 3 TABLETS (75 MG TOTAL) BY MOUTH DAILY. (Patient taking differently: Take 75 mg by mouth daily.) 90 tablet 1  . pantoprazole (PROTONIX) 40 MG tablet Take 40 mg by mouth daily before breakfast.    .  polyethylene glycol (MIRALAX / GLYCOLAX) 17 g packet Take 17 g by mouth 2 (two) times daily. 14 each 0  . polyethylene glycol powder (GLYCOLAX/MIRALAX) 17 GM/SCOOP powder TAKE 17 G BY MOUTH 2 (TWO) TIMES DAILY. (Patient taking differently: Take 17 g by mouth in the morning and at bedtime.) 510 g 0  . potassium chloride SA (KLOR-CON) 20 MEQ tablet TAKE 2 TABLETS (40 MEQ TOTAL) BY MOUTH DAILY. (Patient taking differently: Take 40 mEq by mouth daily.) 60 tablet 1  . risperiDONE (RISPERDAL) 0.5 MG tablet Take 1 tablet (0.5 mg total) by mouth at bedtime. 30 tablet 0  . sacubitril-valsartan (ENTRESTO) 24-26 MG TAKE 1 TABLET BY MOUTH TWO TIMES DAILY. 60 tablet 0  . senna (SENOKOT) 8.6 MG TABS tablet Take 2 tablets (17.2 mg total) by mouth 2 (two) times daily. 120 tablet 0  . spironolactone (ALDACTONE) 25 MG tablet Take 0.5 tablets (12.5 mg total) by mouth daily. 30 tablet 0  . tamsulosin (FLOMAX) 0.4 MG CAPS capsule Take 0.4 mg by mouth in the morning.    . traMADol (ULTRAM) 50 MG tablet TAKE 1-2 TABLETS (50-100 MG TOTAL) BY MOUTH EVERY TWELVE HOURS AS NEEDED (MILD  PAIN). (Patient taking differently: Take 50-100 mg by mouth every 12 (twelve) hours as needed for moderate pain.) 12 tablet 0   Musculoskeletal: Strength & Muscle Tone: unable to fully assess Gait & Station: unable to fully assess; pt laying in bed Patient leans: N/A  Psychiatric Specialty Exam:  Presentation  General Appearance: Disheveled  Eye Contact:Poor  Speech:Clear and Coherent; Pressured; Normal Rate  Speech Volume:Increased  Handedness:Right   Mood and Affect  Mood:Depressed; Anxious; Hopeless  Affect:Congruent; Depressed   Thought Process  Thought Processes:Coherent  Descriptions of Associations:Intact  Orientation:Full (Time, Place and Person)  Thought Content:Illogical  History of Schizophrenia/Schizoaffective disorder:Yes  Duration of Psychotic Symptoms:Greater than six months  Hallucinations:No data recorded Ideas of Reference:None  Suicidal Thoughts:No data recorded Homicidal Thoughts:No data recorded  Sensorium  Memory:Immediate Good; Recent Good; Remote Good  Judgment:Poor  Insight:Fair   Executive Functions  Concentration:Fair  Attention Span:Fair  [place]  Language:Good   Psychomotor Activity  Psychomotor Activity:No data recorded  Assets  Assets:Communication Skills; Desire for Improvement; Resilience; Housing; Financial Resources/Insurance   Sleep  Sleep:No data recorded  Physical Exam: Physical Exam Vitals and nursing note reviewed.  Psychiatric:        Mood and Affect: Affect is flat.        Behavior: Behavior is cooperative.        Thought Content: Thought content normal.    Review of Systems  Psychiatric/Behavioral: Positive for depression.  All other systems reviewed and are negative.  Blood pressure 100/67, pulse (!) 111, temperature 97.8 F (36.6 C), temperature source Oral, resp. rate 19, SpO2 96 %. There is no height or weight on file to calculate BMI.  Treatment Plan  Summary: Plan consult with SW/TOC regarding patient placement as patient states he is now in agreement with previous offers.  Disposition: No evidence of imminent risk to self or others at present.   Patient does not meet criteria for psychiatric inpatient admission. Supportive therapy provided about ongoing stressors. Discussed crisis plan, support from social network, calling 911, coming to the Emergency Department, and calling Suicide Hotline. TOC/SW consult placed to assist patient with placement.   [name], NP 03/20/2021 2:42 PM

## 2021-03-20 NOTE — ED Notes (Addendum)
Patient changed out into burgundy scrubs. Patient has one white patients belonging bag and one large red biohazard bag at the nurses station across from room 18.

## 2021-03-20 NOTE — BH Assessment (Addendum)
Comprehensive Clinical Assessment (CCA) Screening, Triage and Referral Note  03/20/2021 HOA DERISO [id number] Disposition: Patient was reviewed with Darrol Angel, NP.  She recommended patient be observed for the rest of the morning and reviewed by psychiatry.  Clinician informed PA Jarrett Soho Muthersbaugh via secure messaging.    Pt says that he will overdose if he is discharged. Patient had been discharged last night and came back about 2 hours later. Pt says that he talked to a counselor and was instructed to go back to the ED to be reassessed. That counselor not affiliated with [facility] or [facility]. [name] sayng he is not getting the kind of help he needs. When asked what kind of help he needs he says that it helped when he was at [facility]. To note, that was when patients were at state hospitals for long periods. Patient denies any HI or visual hallucinations. Patient says he hears voices telling him to kill himself.  Pt is slightly irritated to be awakened but was cooperative overall.  Patient was asked about housing options that were presented to him by Education officer, museum.  He said he was not here for that, he needed help for his mental health.  Pt does not appear to be responding to internal stimuli.  He is not engaged in delusional thought processes.  Patient appetete and sleep appear to be WNL.    Chief Complaint:  Chief Complaint  Patient presents with  . Suicidal   Visit Diagnosis: Schizoaffective d/o  Patient Reported Information How did you hear about [place]? Self   Referral name: Patient had been discharged last night and came back in 2 hours.   He was seen by [facility] approximately 24 hours ago.   Referral phone number: 0 (N/A)  Whom do you see for routine medical problems? Primary Care   Practice/Facility Name: Pt does not know.   Practice/Facility Phone Number: 0 (Not assessed)   Name of Contact:   Contact Number: Not assessed   Contact Fax  Number: Not assessed   Prescriber Name: Not assessed   Prescriber Address (if known): Not assessed  What Is the Reason for Your Visit/Call Today? Pt says that he will overdose if he is discharged.  Patient had been discharged last night and came back about 2 hours later.  Pt says that he talked to a counselor and was instructed to go back to the ED to be reassessed.  That counselor not affiliated with [facility] or [place].  [name] sayng he is not getting the kind of help he needs.  When asked what kind of help he needs he says that it helped when he was at [facility].  To note, that was when patients were at state hospitals for long periods.  Patient denies any HI or visual hallucinations.  Patient says he hears voices telling him to kill himself.  How Long Has This Been Causing You Problems? > than 6 months  Have You Recently Been in Any Inpatient Treatment (Hospital/Detox/Crisis Center/28-Day Program)? No   Name/Location of Program/Hospital:No data recorded  How Long Were You There? No data recorded  When Were You Discharged? No data recorded Have You Ever Received Services From [facility] Before? Yes   Who Do You See at [facility]? Patient has been seen in the ED and at the [facility]  Have You Recently Had Any Thoughts About [place]? Yes   Are You Planning to Commit  Suicide/Harm Yourself At This time?  Yes (Pt says he will overdose on sleeping pills.)  Have you Recently Had Thoughts About [place]? No   Explanation: No data recorded Have You Used Any Alcohol or Drugs in the Past 24 Hours? No   How Long Ago Did You Use Drugs or Alcohol?  No data recorded  What Did You Use and How Much? No data recorded What Do You Feel Would Help You the Most Today? Treatment for Depression or other mood problem; Housing Assistance  Do You Currently Have a Therapist/Psychiatrist? No   Name of Therapist/Psychiatrist: No data recorded  Have You Been  Recently Discharged From Any Office Practice or Programs? No   Explanation of Discharge From Practice/Program:  No data recorded    CCA Screening Triage Referral Assessment Type of Contact: Tele-Assessment   Is this Initial or Reassessment? Initial Assessment   Date Telepsych consult ordered in [facility]:  03/19/2021   Time Telepsych consult ordered in [facility]:  2314  Patient Reported Information Reviewed? Yes   Patient Left Without Being Seen? No data recorded  Reason for Not Completing Assessment: No data recorded Collateral Involvement: Pt declined to provide verbal consent for clinician to make contact with friends/family for collateral information.  Does Patient Have a Stage manager Guardian? No data recorded  Name and Contact of Legal Guardian:  No data recorded If Minor and Not Living with Parent(s), Who has Custody? N/A  Is CPS involved or ever been involved? Never  Is APS involved or ever been involved? Never  Patient Determined To Be At Risk for Harm To Self or Others Based on Review of Patient Reported Information or Presenting Complaint? Yes, for Self-Harm   Method: No Plan   Availability of Means: No access or NA   Intent: Vague intent or NA   Notification Required: No need or identified person   Additional Information for Danger to Others Potential:  No data recorded  Additional Comments for Danger to Others Potential:  Pt states he's been having thoughts of hurting "anybody, it don't matter."   Are There Guns or Other Weapons in Your Home?  No    Types of Guns/Weapons: No data recorded   Are These Weapons Safely Secured?                              No data recorded   Who Could Verify You Are Able To Have These Secured:    No data recorded Do You Have any Outstanding Charges, Pending Court Dates, Parole/Probation? Pt denies  Contacted To Inform of Risk of Harm To Self or Others: Other: Comment (Pt's assisted living home)  Location of Assessment: [facility]  ED  Does Patient Present under Involuntary Commitment? No   IVC Papers Initial File Date: No data recorded  [place] of Residence: [place] (Homeless in [place])  Patient Currently Receiving the Following Services: Not Receiving Services   Determination of Need: Urgent (48 hours)   Options For Referral: Other: Comment (Pt to be observed and reviewed by psychiatry per Darrol Angel, NP)   Raymondo Band, LCAS

## 2021-03-21 DIAGNOSIS — R45851 Suicidal ideations: Secondary | ICD-10-CM

## 2021-03-21 DIAGNOSIS — F2089 Other schizophrenia: Secondary | ICD-10-CM

## 2021-03-21 DIAGNOSIS — Z5900 Homelessness unspecified: Secondary | ICD-10-CM

## 2021-03-21 LAB — COMPREHENSIVE METABOLIC PANEL
ALT: 36 U/L (ref 0–44)
AST: 23 U/L (ref 15–41)
Albumin: 4 g/dL (ref 3.5–5.0)
Alkaline Phosphatase: 98 U/L (ref 38–126)
Anion gap: 11 (ref 5–15)
BUN: 21 mg/dL — ABNORMAL HIGH (ref 6–20)
CO2: 22 mmol/L (ref 22–32)
Calcium: 9.3 mg/dL (ref 8.9–10.3)
Chloride: 102 mmol/L (ref 98–111)
Creatinine, Ser: 1.25 mg/dL — ABNORMAL HIGH (ref 0.61–1.24)
GFR, Estimated: >60 mL/min (ref >60)
Glucose, Bld: 100 mg/dL — ABNORMAL HIGH (ref 70–99)
Potassium: 4 mmol/L (ref 3.5–5.1)
Sodium: 135 mmol/L (ref 135–145)
Total Bilirubin: 0.7 mg/dL (ref 0.3–1.2)
Total Protein: 7.9 g/dL (ref 6.5–8.1)

## 2021-03-21 LAB — CBC WITH DIFFERENTIAL/PLATELET
Abs Immature Granulocytes: 0.03 10*3/uL (ref 0.00–0.07)
Basophils Absolute: 0.1 10*3/uL (ref 0.0–0.1)
Basophils Relative: 1 %
Eosinophils Absolute: 0.2 10*3/uL (ref 0.0–0.5)
Eosinophils Relative: 2 %
HCT: 47.2 % (ref 39.0–52.0)
Hemoglobin: 15.4 g/dL (ref 13.0–17.0)
Immature Granulocytes: 0 %
Lymphocytes Relative: 13 %
Lymphs Abs: 0.9 10*3/uL (ref 0.7–4.0)
MCH: 25.5 pg — ABNORMAL LOW (ref 26.0–34.0)
MCHC: 32.6 g/dL (ref 30.0–36.0)
MCV: 78 fL — ABNORMAL LOW (ref 80.0–100.0)
Monocytes Absolute: 0.7 10*3/uL (ref 0.1–1.0)
Monocytes Relative: 10 %
Neutro Abs: 5.3 10*3/uL (ref 1.7–7.7)
Neutrophils Relative %: 74 %
Platelets: 173 10*3/uL (ref 150–400)
RBC: 6.05 MIL/uL — ABNORMAL HIGH (ref 4.22–5.81)
RDW: 18.5 % — ABNORMAL HIGH (ref 11.5–15.5)
WBC: 7.1 10*3/uL (ref 4.0–10.5)
nRBC: 0 % (ref 0.0–0.2)

## 2021-03-21 LAB — RAPID URINE DRUG SCREEN, HOSP PERFORMED
Amphetamines: NOT DETECTED
Barbiturates: NOT DETECTED
Benzodiazepines: NOT DETECTED
Cocaine: NOT DETECTED
Opiates: NOT DETECTED
Tetrahydrocannabinol: NOT DETECTED

## 2021-03-21 LAB — ACETAMINOPHEN LEVEL: Acetaminophen (Tylenol), Serum: <10 ug/mL — ABNORMAL LOW (ref 10–30)

## 2021-03-21 LAB — CBG MONITORING, ED: Glucose-Capillary: 111 mg/dL — ABNORMAL HIGH (ref 70–99)

## 2021-03-21 LAB — ETHANOL: Alcohol, Ethyl (B): <10 mg/dL (ref <10)

## 2021-03-21 LAB — SALICYLATE LEVEL: Salicylate Lvl: <7 mg/dL — ABNORMAL LOW (ref 7.0–30.0)

## 2021-03-21 MED ORDER — RISPERIDONE 1 MG PO TABS
1.0000 mg | ORAL_TABLET | Freq: Every day | ORAL | Status: DC
Start: 2021-03-21 — End: 2021-03-21

## 2021-03-21 NOTE — ED Notes (Signed)
Pt departed the dept via transport vehicle set-up by social work.

## 2021-03-21 NOTE — Progress Notes (Addendum)
CSW spoke with Charles [name], TTS: pt is psychiatrically cleared.    CSW met with pt regarding housing options.  Pt reports he has been staying in [place] with a girlfriend and this is no longer an option.  Pt non specific on how long her has been in [place].  Pt reports he does not have any housing options, friends, family that he can stay with.  Pt reports he has been to [facility], has been to local shelters, all have wait lists and are unable to help immediately.  CSW discussed [facility], explained to pt that there is a work requirement and a requirement to [employer], but that it is a large facility with available beds and a program to help be get back into a stable situation.  Pt indicated he is agreeable to this.  Pt walker sitting next to him, CSW inquired about his ability to walk, participate in chores, etc. Pt reports he can walk, "only use the walker because I'm tired due to not rest recently."  Pt reports he can make it up stairs as well.  CSW talked to the sitter nearby and pt has been up and ambulated to restroom independently.    CSW update Dr Rex Kras who is in agreement.  CSW spoke with [employer] [facility] who is able to provide transport to [facility], will be able to pick up pt at [facility] around 2pm.  RN notified and she will have pt ready.  CSW spoke with pt again, confirmed plan and pt willingness to participate in program.  Pt is ambulatory and able to participate.  Lurline Idol, MSW, LCSW 5/14/202212:28 PM

## 2021-03-21 NOTE — ED Notes (Signed)
Pt placed in conference room to complete TTS consult

## 2021-03-21 NOTE — ED Notes (Signed)
Social work speaking with patient at this time

## 2021-03-21 NOTE — BH Assessment (Signed)
Comprehensive Clinical Assessment (CCA) Note  03/21/2021 Charles [name] [id number]  Disposition: Charles Reasoner, NP recommends pt to be observed and reassessed by psychiatry in the [name] due to pt needing a walker, [facility] is at capacity, social work to follow up. Disposition discussed with Charles Bode, RN. RN to discuss disposition with EDP.   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 Acute risk factors for suicide include: Pt reported, he his suicidal with a plan, AV with command. Pt reports he does not like to tell people how's he's doing. Protective factors for this patient include: None. Considering these factors, the overall suicide risk at this point appears to be high. [name] appropriate for outpatient follow up.  Charles [name] is a 55 year old male who presents to [facility]. Per chart pt has had 10 [name] admissions in the last six months. Clinician asked the pt, "what brought you to the hospital?" Pt reported, either he gets committed or let him die, he has his last Will and Testament on a piece of paper. Pt reports, he tried to get help and was disappointed. Pt reported, he wants to hang himself or he will go to the [facility] and jump. Clinician asked the pt does he have something to hang himself with, pt replied, "can't tell." Pt  reported, hearing voices telling him to hurt himself. Pt reported, he restarted his medication a couple days ago but he doesn't feel they are working. Pt does not know the name of his medications. Pt reported, wanting housing and to have his mental health, medications addressed (wanting inpatient treatment). Pt reported, previous inpatient admissions at [facility] and [place]. Pt denies, HI, self-injurious behaviors and access to weapons. 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 Pt reported, if he ad the money will relapse. Pt reported, he can not contract for safety if discharged.   Diagnosis: Major Depressive Disorder, recurrent, severe with psychotic features.   *Pt expressed to clinician that she can call collaterals after he kills himself.*  ______________________________________________________  Pt was assessed by Charles Merlin, NP on 03/20/2021. Per note: " Charles [name] is a 55 y.o. male patient admitted with passive suicidal ideations. Per chart review, patient has several [name] encounters within [facility] system over past 2.5 weeks; patient declined several placement options. Patient was psychiatrically cleared and discharged from [facility]; returned 2 hours later.  On assessment patient presents with blunt affect laying in bed. Patient states he went to placement hospital social worker referred him to upon discharge where he was told he did not have placement; says he began feeling hopeless and suicidal, no specific plan. Patient expressed frustration with not having secured housing; provider discussed prior attempts to place patient and his refusal to go secured placements. He denies any ACTT Team or other community supports. Patient now states he is willing to go to  [facility]; denies any active homicidal or suicidal ideations, auditory or visual hallucinations at this time. [name] not actively psychotic and does not appear to be responding to any  external/internal stimuli at this time. Patient does not meet inpatient psychiatric hospitalization criteria at this time; TOC/SW consult placed to address placement.   HPI:   Charles [name] is a 55 year old male who presented voluntarily to [facility] for passive suicidal ideations two hours after discharge and refusing placement. Patient has documented history of schizophrenia and reports prior hospitalization to [facility] and [facility]". Per chart review, patient is currently receiving chemotherapy treatment for Hodgkin's Lymphoma; last treatment noted 03/12/21. Per chart review 02/27/21, patient was last housed at "[facility]"; 03/16/21 LCSW reached out to patient regarding [facility] in which he ultimately refused.  __________________________________________________  Per Charles [name], Case Manager [facility] CM/CW spoke to pt on 03/20/2021. Per note: "TOC CM spoke to pt and states he is homeless. States his check for May was kept by SS due to money owed to government. States he left his previous ALF due to they were taking his money. He was suppose to get a $55 discharge check from his check. Pt states he left the ALF because he felt they were stealing his money and staff was not helpful. TOC CM contacted [facility]. Pt states he on waiting list. They have a waiting list and not beds not available. [place], and [facility] and they are full. Contacted [facility] in [place] and they have bed. TOC CM had pt sign transportation waiver. [facility] transportation waiver scanned. Contacted [facility] for transportation.   6:00 pm Pt was sent back from [facility] due to not being able to climb up to top bunk. He was returned back to hospital. [facility] CM spoke to pt and no shelter available in the community.   Chief Complaint:  Chief Complaint  Patient presents with  . Suicidal   Visit Diagnosis:     CCA Screening, Triage and Referral (STR)  Patient Reported Information How  did you hear about [place]? Self  Referral name: Patient had been discharged last night and came back in 2 hours.   He was seen by TTS approximately 24 hours ago.  Referral phone number: 0 (N/A)   Whom do you see for routine medical problems? Primary Care  Practice/Facility Name: Pt does not know.  Practice/Facility Phone Number: 0 (Not assessed)  Name of Contact: "Some foreign guy."  Contact Number: Not assessed  Contact Fax Number: Not assessed  Prescriber Name: Not assessed  Prescriber Address (if known): Not assessed   What Is the Reason for Your Visit/Call Today? Pt says that he will overdose if he is discharged.  Patient had been discharged last night and came back about 2 hours later.  Pt says that he talked to a counselor and was instructed to go back to the [name] to be reassessed.  That counselor not affiliated with TTS or [facility].  [name] sayng he is not getting the kind of help he needs.  When asked what kind of help he needs he says that it helped when he was at [facility].  To note, that was when patients were at state hospitals for long periods.  Patient denies any HI or visual hallucinations.  Patient says he hears voices telling him to kill himself.  How Long Has This Been Causing You Problems? > than 6 months  What  Do You Feel Would Help You the Most Today? Treatment for Depression or other mood problem; Housing Assistance   Have You Recently Been in Any Inpatient Treatment (Hospital/Detox/Crisis Center/28-Day Program)? No  Name/Location of Program/Hospital:No data recorded How Long Were You There? No data recorded When Were You Discharged? No data recorded  Have You Ever Received Services From [facility] Before? Yes  Who Do You See at [facility]? Patient has been seen in the [name] and at the [facility]   Have You Recently Had Any Thoughts About [place]? Yes  Are You Planning to Commit Suicide/Harm Yourself At This time? Yes  (Pt says he will overdose on sleeping pills.)   Have you Recently Had Thoughts About [place]? No  Explanation: No data recorded  Have You Used Any Alcohol or Drugs in the Past 24 Hours? No  How Long Ago Did You Use Drugs or Alcohol? No data recorded What Did You Use and How Much? No data recorded  Do You Currently Have a Therapist/Psychiatrist? No  Name of Therapist/Psychiatrist: No data recorded  Have You Been Recently Discharged From Any Office Practice or Programs? No  Explanation of Discharge From Practice/Program: No data recorded    CCA Screening Triage Referral Assessment Type of Contact: Tele-Assessment  Is this Initial or Reassessment? Initial Assessment  Date Telepsych consult ordered in [facility]:  03/19/2021  Time Telepsych consult ordered in [facility]:  2314   Patient Reported Information Reviewed? Yes  Patient Left Without Being Seen? No data recorded Reason for Not Completing Assessment: No data recorded  Collateral Involvement: Pt declined to provide verbal consent for clinician to make contact with friends/family for collateral information.   Does Patient Have a Stage manager Guardian? No data recorded Name and Contact of Legal Guardian: No data recorded If Minor and Not Living with Parent(s), Who has Custody? N/A  Is CPS involved or ever been involved? Never  Is APS involved or ever been involved? Never   Patient Determined To Be At Risk for Harm To Self or Others Based on Review of Patient Reported Information or Presenting Complaint? Yes, for Self-Harm  Method: No Plan  Availability of Means: No access or NA  Intent: Vague intent or NA  Notification Required: No need or identified person  Additional Information for Danger to Others Potential: No data recorded Additional Comments for Danger to Others Potential: Pt states he's been having thoughts of hurting "anybody, it don't matter."  Are There Guns or Other Weapons in Your  Home? No  Types of Guns/Weapons: No data recorded Are These Weapons Safely Secured?                            No data recorded Who Could Verify You Are Able To Have These Secured: No data recorded Do You Have any Outstanding Charges, Pending Court Dates, Parole/Probation? Pt denies  Contacted To Inform of Risk of Harm To Self or Others: Other: Comment (Pt's assisted living home)   [facility] of [facility]: [name]   Does Patient Present under Involuntary Commitment? No  IVC Papers Initial File Date: No data recorded  [place] of Residence: [place] (Homeless in [place])   Patient Currently Receiving the Following Services: Not Receiving Services   Determination of Need: Urgent (48 hours)   Options For Referral: Other: Comment (Pt to be observed and reviewed by psychiatry per Darrol Angel, NP)     CCA Biopsychosocial Intake/Chief Complaint:  Per  EDP/PA note: "a 55 y.o. male  was evaluated in triage. Pt complains of SI, dc from hospital for same earlier today. Called mobile crisis today and reports plan to hang self with means to do so."  Current Symptoms/Problems: Pt is suicidal with a plan, hearing voices telling him to hurt himself, depressive symtpoms.   Patient Reported Schizophrenia/Schizoaffective Diagnosis in Past: Yes   Strengths: Not assessed  Preferences: Not assessed  Abilities: Not assessed   Type of Services Patient Feels are Needed: inpatient and housing   Initial Clinical Notes/Concerns: N/A   Mental Health Symptoms Depression:  Sleep (too much or little); Increase/decrease in appetite; Hopelessness; Worthlessness; Fatigue; Irritability; Tearfulness   Duration of Depressive symptoms: Less than two weeks   Mania:  None   Anxiety:   Fatigue   Psychosis:  Hallucinations   Duration of Psychotic symptoms: Greater than six months   Trauma:  None   Obsessions:  None   Compulsions:  None   Inattention:  None   Hyperactivity/Impulsivity:   N/A   Oppositional/Defiant Behaviors:  None   Emotional Irregularity:  Recurrent suicidal behaviors/gestures/threats   Other Mood/Personality Symptoms:  None noted    Mental Status Exam Appearance and self-care  Stature:  Average   Weight:  Average weight   Clothing:  Age-appropriate   Grooming:  Normal   Cosmetic use:  None   Posture/gait:  Other (Comment) (Patient requires a walker.)   Motor activity:  Not Remarkable   Sensorium  Attention:  Normal   Concentration:  Normal   Orientation:  X5   Recall/memory:  Normal   Affect and Mood  Affect:  Anxious; Depressed   Mood:  Anxious; Depressed; Worthless; Hopeless   Relating  Eye contact:  Normal   Facial expression:  Depressed   Attitude toward examiner:  Cooperative   Thought and Language  Speech flow: Normal   Thought content:  Appropriate to Mood and Circumstances   Preoccupation:  None   Hallucinations:  Auditory; Command (Comment)   Organization:  No data recorded  [employer] of Knowledge:  Average   Intelligence:  Average   Abstraction:  Functional   Judgement:  Impaired   Reality Testing:  Realistic   Insight:  Fair   Decision Making:  Impulsive   Social Functioning  Social Maturity:  Isolates   Social Judgement:  Normal   Stress  Stressors:  Housing; Other (Comment) (Wanting inpatient treatment.)   Coping Ability:  Exhausted; Overwhelmed   Skill Deficits:  Self-control   Supports:  Support needed     Religion: Religion/Spirituality Are You A Religious Person?: Yes (Pt reported, "sometimes.") How Might This Affect Treatment?: Not assessed  Leisure/Recreation:    Exercise/Diet: Exercise/Diet Do You Exercise?:  (Not assessed.) Have You Gained or Lost A Significant Amount of Weight in the Past Six Months?:  (Not assessed.) Do You Follow a Special Diet?:  (Not assessed.) Do You Have Any Trouble Sleeping?: Yes Explanation of Sleeping Difficulties: Pt  reported, not really sleeping.   CCA Employment/Education Employment/Work Situation: Employment / Work Situation Employment situation: On disability Why is patient on disability: Per chart, "Mental health and physical disability." How long has patient been on disability: Per chart, "Since the 1990's." What is the longest time patient has a held a job?: Not assessed. Where was the patient employed at that time?: Not assessed. Has patient ever been in the [employer]?: No  Education: Education Last Grade Completed: 12 Did You Graduate From [employer]?: Yes (patient  did get his GED) Did You Attend College?: No   CCA Family/Childhood History Family and Relationship History: Family history Marital status: Separated Separated, when?: Pt reported, "I don't know." What types of issues is patient dealing with in the relationship?: Not assessed. Additional relationship information: Not assessed. Are you sexually active?:  (Not assessed.) What is your sexual orientation?: Not assessed. Has your sexual activity been affected by drugs, alcohol, medication, or emotional stress?: Not assessed. Does patient have children?: Yes How many children?: 1 (Pt states he has 1-2 children; pt did not elaborate on this) How is patient's relationship with their children?: Not assessed.  Childhood History:  Childhood History By whom was/is the patient raised?: Grandparents (Per chart.) Additional childhood history information: Per chart, "Patient states that his mother had mental health issues." Description of patient's relationship with caregiver when they were a child: Patient states that he was raised by his grandparents in what he describes as a good home Patient's description of current relationship with people who raised him/her: Not assessed. How were you disciplined when you got in trouble as a child/adolescent?: Not assessed. Does patient have siblings?: Yes (Not assessed) Number of Siblings:  3 Description of patient's current relationship with siblings: Pt repots, he has one deceased brother, a brother and sister that are alive. Did patient suffer any verbal/emotional/physical/sexual abuse as a child?: No Has patient ever been sexually abused/assaulted/raped as an adolescent or adult?: No  Child/Adolescent Assessment:     CCA Substance Use Alcohol/Drug Use: Alcohol / Drug Use Pain Medications: See MAR Prescriptions: See MAR Over the Counter: See MAR History of alcohol / drug use?: No history of alcohol / drug abuse (Pt denies.) Longest period of sobriety (when/how long): Pt reported, he's been sober from alochol for 40 years and from crack for 9.5 years.     ASAM's:  Six Dimensions of Multidimensional Assessment  Dimension 1:  Acute Intoxication and/or Withdrawal Potential:      Dimension 2:  Biomedical Conditions and Complications:      Dimension 3:  Emotional, Behavioral, or Cognitive Conditions and Complications:     Dimension 4:  Readiness to Change:     Dimension 5:  Relapse, Continued use, or Continued Problem Potential:     Dimension 6:  Recovery/Living Environment:     ASAM Severity Score:    ASAM Recommended Level of Treatment:     Substance use Disorder (SUD)    Recommendations for Services/Supports/Treatments: Recommendations for Services/Supports/Treatments Recommendations For Services/Supports/Treatments: Other (Comment) (Pt to be observed and reassessed by psychiatry in the [name].)  DSM5 Diagnoses: Patient Active Problem List   Diagnosis Date Noted  . Passive suicidal ideations 03/20/2021  . MDD (major depressive disorder), recurrent, severe, with psychosis ([place]) 02/28/2021  . Acute hypoxemic respiratory failure ([place]) 01/13/2021  . Acute on chronic heart failure with reduced ejection fraction and diastolic dysfunction ([place]) 01/12/2021  . Schizophrenia ([place]) 01/04/2021  . Diarrhea 01/04/2021  . Pulmonary edema   . Mediastinal adenopathy  12/18/2020  . Adjustment disorder with mixed anxiety and depressed mood 10/30/2020  . Mediastinal lymphadenopathy 10/11/2020  . Shortness of breath 10/11/2020  . Sinus tachycardia 10/11/2020  . Night sweats 10/11/2020  . DM2 (diabetes mellitus, type 2) ([place]) 10/11/2020  . 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 Hypertension 09/25/2020  . BPH (benign prostatic hyperplasia) 09/25/2020  . Insomnia 09/25/2020    Referrals to Alternative Service(s): Referred to Alternative Service(s):   Place:   Date:  Time:    Referred to Alternative Service(s):   Place:   Date:   Time:    Referred to Alternative Service(s):   Place:   Date:   Time:    Referred to Alternative Service(s):   Place:   Date:   Time:     Vertell Novak, LCMHCComprehensive Clinical Assessment (CCA) Screening, Triage and Referral Note  03/21/2021 Charles [name] [id number]  Chief Complaint:  Chief Complaint  Patient presents with  . Suicidal   Visit Diagnosis:   Patient Reported Information How did you hear about [place]? Self   Referral name: Patient had been discharged last night and came back in 2 hours.   He was seen by TTS approximately 24 hours ago.   Referral phone number: 0 (N/A)  Whom do you see for routine medical problems? Primary Care   Practice/Facility Name: Pt does not know.   Practice/Facility Phone Number: 0 (Not assessed)   Name of Contact: "Some foreign guy."   Contact Number: Not assessed   Contact Fax Number: Not assessed   Prescriber Name: Not assessed   Prescriber Address (if known): Not assessed  What Is the Reason for Your Visit/Call Today? Pt says that he will overdose if he is discharged.  Patient had been discharged last night and came back about 2 hours later.  Pt says that he talked to a counselor and was instructed to go back to the [name] to be reassessed.  That counselor not affiliated with TTS or [place].  [name] sayng  he is not getting the kind of help he needs.  When asked what kind of help he needs he says that it helped when he was at [facility].  To note, that was when patients were at state hospitals for long periods.  Patient denies any HI or visual hallucinations.  Patient says he hears voices telling him to kill himself.  How Long Has This Been Causing You Problems? > than 6 months  Have You Recently Been in Any Inpatient Treatment (Hospital/Detox/Crisis Center/28-Day Program)? No   Name/Location of Program/Hospital:No data recorded  How Long Were You There? No data recorded  When Were You Discharged? No data recorded Have You Ever Received Services From [facility] Before? Yes   Who Do You See at [facility]? Patient has been seen in the [name] and at the [facility]  Have You Recently Had Any Thoughts About [place]? Yes   Are You Planning to Commit Suicide/Harm Yourself At This time?  Yes (Pt says he will overdose on sleeping pills.)  Have you Recently Had Thoughts About [place]? No   Explanation: No data recorded Have You Used Any Alcohol or Drugs in the Past 24 Hours? No   How Long Ago Did You Use Drugs or Alcohol?  No data recorded  What Did You Use and How Much? No data recorded What Do You Feel Would Help You the Most Today? Treatment for Depression or other mood problem; Housing Assistance  Do You Currently Have a Therapist/Psychiatrist? No   Name of Therapist/Psychiatrist: No data recorded  Have You Been Recently Discharged From Any Office Practice or Programs? No   Explanation of Discharge From Practice/Program:  No data recorded    CCA Screening Triage Referral Assessment Type of Contact: Tele-Assessment   Is this Initial or Reassessment? Initial Assessment   Date Telepsych consult ordered in [facility]:  03/19/2021   Time Telepsych consult ordered in [facility]:  2314  Patient Reported Information Reviewed? Yes   Patient Left Without Being  Seen? No data recorded  Reason for Not Completing Assessment: No data recorded Collateral Involvement: Pt declined to provide verbal consent for clinician to make contact with friends/family for collateral information.  Does Patient Have a Stage manager Guardian? No data recorded  Name and Contact of Legal Guardian:  No data recorded If Minor and Not Living with Parent(s), Who has Custody? N/A  Is CPS involved or ever been involved? Never  Is APS involved or ever been involved? Never  Patient Determined To Be At Risk for Harm To Self or Others Based on Review of Patient Reported Information or Presenting Complaint? Yes, for Self-Harm   Method: No Plan   Availability of Means: No access or NA   Intent: Vague intent or NA   Notification Required: No need or identified person   Additional Information for Danger to Others Potential:  No data recorded  Additional Comments for Danger to Others Potential:  Pt states he's been having thoughts of hurting "anybody, it don't matter."   Are There Guns or Other Weapons in Your Home?  No    Types of Guns/Weapons: No data recorded   Are These Weapons Safely Secured?                              No data recorded   Who Could Verify You Are Able To Have These Secured:    No data recorded Do You Have any Outstanding Charges, Pending Court Dates, Parole/Probation? Pt denies  Contacted To Inform of Risk of Harm To Self or Others: Other: Comment (Pt's assisted living home)  [name]  Does Patient Present under Involuntary Commitment? No   IVC Papers Initial File Date: No data recorded  [place] of Residence: [place] (Homeless in [place])  Patient Currently Receiving the Following Services: Not Receiving Services   Determination of Need: Urgent (48 hours)   Options For Referral: Other: Comment (Pt to be observed and reviewed by psychiatry per Darrol Angel, NP)   Vertell Novak, [place], [place], [facility], [facility] Triage Specialist [phone]

## 2021-03-21 NOTE — BH Assessment (Signed)
Clinician to call the TTS cart in 10 minutes. Per Estill Bamberg, NT/NS.    Vertell Novak, [place], [facility], [facility] Triage Specialist [phone]

## 2021-03-21 NOTE — ED Notes (Signed)
Pt ambulatory to and from bathroom with sitter.

## 2021-03-21 NOTE — ED Notes (Signed)
TTS assessment in progress. 

## 2021-03-21 NOTE — ED Notes (Signed)
Per Social Work, pt has agreed to go to [facility]. ED provider notified and transport ETA at 1400 today.

## 2021-03-21 NOTE — ED Provider Notes (Signed)
Patient evaluated by psychiatry team this morning and psychiatrically cleared.  He is currently homeless and was not able to stay at most recent shelter because he was only offered a top bunk bed and he is not able to physically get up to a top bunk.  Social work was contacted and they were able to find patient a place at [facility].  Transport team contacted and they plan to pick the patient up for a ride to the shelter at 2 PM.   Prather Failla, Wenda Overland, MD 03/21/21 1221

## 2021-03-21 NOTE — BH Assessment (Signed)
Clinician called TTS cart however the pt has not been placed in a private room. Clinician told NT she will call back in 10-15 minutes.     Vertell Novak, [place], [facility], [facility] Triage Specialist [phone]

## 2021-03-21 NOTE — ED Notes (Signed)
Pt sitting upright in recliner chair in hallway. Resting quietly with eyes closed. Sitter with pt in direct line of sight. 1:1.

## 2021-03-21 NOTE — BHH Suicide Risk Assessment (Cosign Needed Addendum)
Suicide Risk Assessment  Discharge Assessment   [facility] Discharge Suicide Risk Assessment   Principal Problem: <principal problem not specified> Discharge Diagnoses: Active Problems:   Schizophrenia ([place])   MDD (major depressive disorder), recurrent, severe, with psychosis ([place])   Homelessness   Total Time spent with patient: 30 minutes   Charles [name], 55 y.o., male patient presented to [facility] ED.  Patient seen via telepsych by this provider; chart reviewed and consulted with Dr. Dwyane Dee on 03/21/21.  On evaluation Charles [name] reports chronic suicidal ideations, no longer has a plan or intent; but he does report being homeless makes him want to harm himself. States if he could find a place to stay he would feel better about his situation.  He was seen 3x witihin the past for similar concerns, previously given homeless resources which it appears he has not used.  Yesterday he was accepted at a homeless shelter but given a top bunk.  Unfortunately, the patient has ambulation concerns that precluded him from accepting the bed.  He was not offered a lower bunk bed and eventually the bed was rescinded.  Since his primary concern is homelessness, will request pharmacy assistance in securing a suitable housing arrangement for patient.    HPI: Per EDP Admission Note 03/21/2021: History    Chief Complaint  Patient presents with  . Suicidal    Charles [name] is a 55 y.o. male.  Patient presents to the emergency department with a chief complaint of suicidal thoughts.  He states that "either institutionalized me or let me die."  He states that if he leaves he is going to kill himself.  Despite questioning him, patient delivers no further history.  Level 5 caveat applies secondary to psychiatric condition.  The history is provided by the patient. No language interpreter was used.     Musculoskeletal:  Psychiatric Specialty Exam  Presentation  General Appearance: Disheveled  Eye  Contact:Poor  Speech: clear, coherent, normal speech Speech Volume:Increased  Handedness:Right   Mood and Affect  Mood:Depressed; Anxious; Hopeless  Duration of Depression Symptoms: Less than two weeks  Affect:Congruent; Depressed   Thought Process  Thought Processes:Coherent  Descriptions of Associations:Intact  Orientation:Full (Time, Place and Person)  Thought Content:rumination over his current situation History of Schizophrenia/Schizoaffective disorder:Yes  Duration of Psychotic Symptoms:Greater than six months  Hallucinations: denies Ideas of Reference: denies Suicidal Thoughts: pt has chronic suicidal ideations but currently denies plan or intent. Homicidal Thoughts: denies  Sensorium  Memory:Immediate Good; Recent Good; Remote Good  Judgment:Fair baseline  Insight:Fair baseline  Executive Functions  Concentration:Fair  Attention Span:Fair  Recall:Good  Fund of Knowledge:Good  Language:Good   Psychomotor Activity  Psychomotor Activity:No data recorded  Assets  Assets:Communication Skills; Desire for Improvement; Resilience; Housing; Financial Resources/Insurance   Sleep  Sleep: >6 hours  Physical Exam: @PHYEXAMBYAGE @ @ROS @ Blood pressure 102/75, pulse (!) 112, temperature 98.1 F (36.7 C), temperature source Oral, resp. rate 18, height 5\' 9"  (1.753 m), weight 108.9 kg, SpO2 98 %. Body mass index is 35.44 kg/m.  Mental Status Per Nursing Assessment::   On Admission:     Demographic Factors:  Low socioeconomic status  Loss Factors: Financial problems/change in socioeconomic status  Historical Factors: Impulsivity  Risk Reduction Factors:   NA  Continued Clinical Symptoms:  More than one psychiatric diagnosis Medical Diagnoses and Treatments/Surgeries  Cognitive Features That Contribute To Risk:  Closed-mindedness    Suicide Risk:  Mild:  Suicidal ideation of limited frequency, intensity, duration, and specificity.  There are no identifiable plans, no associated intent, mild dysphoria and related symptoms, good self-control (both objective and subjective assessment), few other risk factors, and identifiable protective factors, including available and accessible social support.   Follow-up Information    Schedule an appointment as soon as possible for a visit  with [facility].   Contact information: [address]  [place] [address] [phone]               Plan Of Care/Follow-up recommendations:  Plan- As per above assessment, there are no current grounds for involuntary commitment at this time.? Patient has chronic suicidal ideations but contracts for safety and denies SI when offered housing.  He is referred for housing at [facility] in [place], [place]. SW to coordinate transportation for patient.   [name] not currently interested in inpatient services, but expresses agreement to continue outpatient treatment.  He should continue his prescribed medications.  We have discussed the importance of medication compliance. ?  Disposition: No evidence of imminent risk to self or others at present.   Patient does not meet criteria for psychiatric inpatient admission. Supportive therapy provided about ongoing stressors. Discussed crisis plan, support from social network, calling 911, coming to the Emergency Department, and calling Suicide Hotline.   Spoke with Dr. Theotis Burrow, EDP; Glennie [place], SW; were informed of above recommendation and disposition.   This service was provided via telemedicine using a 2-way, interactive audio and video technology.?  Names of all persons participating in this telemedicine service and their role in this encounter.?  Name:  Role: Levert L. Moraine  Name: Merlyn Lot Role: PMHNP?  ?  ? Charles Darting, NP 03/21/2021, 9:16 PM

## 2021-03-24 ENCOUNTER — Encounter: Payer: Medicaid Other | Admitting: Student

## 2021-03-24 ENCOUNTER — Other Ambulatory Visit (HOSPITAL_COMMUNITY): Payer: Self-pay

## 2021-03-25 ENCOUNTER — Other Ambulatory Visit: Payer: Medicaid Other

## 2021-03-25 ENCOUNTER — Ambulatory Visit: Payer: Medicaid Other | Admitting: Hematology and Oncology

## 2021-03-30 ENCOUNTER — Encounter: Payer: Medicaid Other | Admitting: Student

## 2021-04-15 ENCOUNTER — Telehealth: Payer: Self-pay

## 2021-04-15 ENCOUNTER — Ambulatory Visit: Payer: Medicaid Other | Admitting: Radiation Oncology

## 2021-04-15 NOTE — Telephone Encounter (Signed)
Noticed patient was not checked in for F/U with Dr. Isidore Moos. Unable to reach patient or leave VM with numbers listed in patient's chart. Called patient's emergency contact Suanne Marker who informed me that patient was currently at [facility]. She stated she would try to get in touch with patient, and pass along my contact number so patient can reschedule appointment with Dr. Isidore Moos.   A few minutes later received a call from patient himself. Offered to reschedule appointment today, but he stated he would prefer to call office once he is back in [place]. Informed patient I would call him around Thursday of next week for an update if I hadn't heard from him before then. Patient verbalized understanding and agreement, and denied any other needs at this time

## 2021-04-24 ENCOUNTER — Telehealth: Payer: Self-pay | Admitting: *Deleted

## 2021-04-24 NOTE — Telephone Encounter (Signed)
CALLED PATIENT TO ASK ABOUT [place] MISSED FU FOR 04-15-21, VM FULL, UNABLE TO LEAVE MESSAGE

## 2021-04-25 ENCOUNTER — Encounter (HOSPITAL_COMMUNITY): Payer: Self-pay | Admitting: Emergency Medicine

## 2021-04-25 ENCOUNTER — Emergency Department (HOSPITAL_COMMUNITY)
Admission: EM | Admit: 2021-04-25 | Discharge: 2021-04-25 | Disposition: A | Discharge status: Home / Self Care | Payer: Medicaid Other | Attending: Emergency Medicine | Admitting: Emergency Medicine

## 2021-04-25 ENCOUNTER — Emergency Department (HOSPITAL_COMMUNITY): Payer: Medicaid Other

## 2021-04-25 ENCOUNTER — Other Ambulatory Visit: Payer: Self-pay

## 2021-04-25 DIAGNOSIS — Z7984 Long term (current) use of oral hypoglycemic drugs: Secondary | ICD-10-CM | POA: Diagnosis not present

## 2021-04-25 DIAGNOSIS — Z85118 Personal history of other malignant neoplasm of bronchus and lung: Secondary | ICD-10-CM | POA: Insufficient documentation

## 2021-04-25 DIAGNOSIS — F17210 Nicotine dependence, cigarettes, uncomplicated: Secondary | ICD-10-CM | POA: Insufficient documentation

## 2021-04-25 DIAGNOSIS — Z9981 Dependence on supplemental oxygen: Secondary | ICD-10-CM | POA: Diagnosis not present

## 2021-04-25 DIAGNOSIS — Z794 Long term (current) use of insulin: Secondary | ICD-10-CM | POA: Diagnosis not present

## 2021-04-25 DIAGNOSIS — Z79899 Other long term (current) drug therapy: Secondary | ICD-10-CM | POA: Insufficient documentation

## 2021-04-25 DIAGNOSIS — I110 Hypertensive heart disease with heart failure: Secondary | ICD-10-CM | POA: Insufficient documentation

## 2021-04-25 DIAGNOSIS — R059 Cough, unspecified: Secondary | ICD-10-CM | POA: Diagnosis present

## 2021-04-25 DIAGNOSIS — E119 Type 2 diabetes mellitus without complications: Secondary | ICD-10-CM | POA: Diagnosis not present

## 2021-04-25 DIAGNOSIS — Z8616 Personal history of COVID-19: Secondary | ICD-10-CM | POA: Insufficient documentation

## 2021-04-25 DIAGNOSIS — I5033 Acute on chronic diastolic (congestive) heart failure: Secondary | ICD-10-CM | POA: Insufficient documentation

## 2021-04-25 DIAGNOSIS — Z20822 Contact with and (suspected) exposure to covid-19: Secondary | ICD-10-CM | POA: Insufficient documentation

## 2021-04-25 DIAGNOSIS — R000 Tachycardia, unspecified: Secondary | ICD-10-CM | POA: Insufficient documentation

## 2021-04-25 DIAGNOSIS — Z8571 Personal history of Hodgkin lymphoma: Secondary | ICD-10-CM | POA: Diagnosis not present

## 2021-04-25 HISTORY — DX: Malignant (primary) neoplasm, unspecified: C80.1

## 2021-04-25 MED ORDER — GUAIFENESIN 100 MG/5ML PO SOLN
5.0000 mL | Freq: Once | ORAL | Status: AC
  Administered 2021-04-25: 100 mg via ORAL
  Filled 2021-04-25: qty 10

## 2021-04-25 MED ORDER — SODIUM CHLORIDE 0.9 % IV BOLUS: 1000.0000 mL | Freq: Once | INTRAVENOUS | Status: DC

## 2021-04-25 NOTE — TOC Initial Note (Addendum)
Transition of Care [facility]) - Initial/Assessment Note    Patient Details  Name: Charles [name] MRN: [id number] Date of Birth: 1966-10-22  Transition of Care [facility]) CM/SW Contact:    Verdell Carmine, RN Phone Number: 04/25/2021, 1:18 PM  Clinical Narrative:                  Patient arrived from assisted Living, He had originally gone there from another hospital and oxygen was ordered. It never came and they were using backup tanks. They were supposed to get a shipment in, last night, but did not. The patient was sent to the hospital due to [facility]. Reached out to Chauncey who called the facility that confirmed this. This CM reached out to [facility] where they had no record of oxygen at our office. We will do repeat oxygen qualifications, then reorder oygen. [facility] will bring oxygen to bedside and set up at assisted living facility. RN will do qualifications.   1350: qualifications done, oxygen ordered via [facility]. They will bring a tank for transport and set him up at [facility] assisted living facility.    Barriers to Discharge: No Barriers Identified, Barriers Resolved   Patient Goals and CMS Choice        Expected Discharge Plan and Services        Back to ALF via safe transport                                         Prior Living Arrangements/Services                       Activities of Daily Living      Permission Sought/Granted                  Emotional Assessment              Admission diagnosis:  cough Patient Active Problem List   Diagnosis Date Noted   Homelessness 03/21/2021   Suicidal ideations 03/20/2021   MDD (major depressive disorder), recurrent, severe, with psychosis ([place]) 02/28/2021   Acute hypoxemic respiratory failure ([place]) 01/13/2021   Acute on chronic heart failure with reduced ejection fraction and diastolic dysfunction ([place]) 01/12/2021   Schizophrenia ([place]) 01/04/2021   Diarrhea 01/04/2021   Pulmonary edema    Mediastinal  adenopathy 12/18/2020   Adjustment disorder with mixed anxiety and depressed mood 10/30/2020   Mediastinal lymphadenopathy 10/11/2020   Shortness of breath 10/11/2020   Sinus tachycardia 10/11/2020   Night sweats 10/11/2020   DM2 (diabetes mellitus, type 2) ([place]) 10/11/2020   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[place]) 10/11/2020   Hypertension 09/25/2020   BPH (benign prostatic hyperplasia) 09/25/2020   Insomnia 09/25/2020   PCP:  [name], MD Pharmacy:   [facility] [place], [place] [address] [place] [address] Phone: [phone] Fax: [phone]     Social Determinants of Health (SDOH) Interventions    Readmission Risk Interventions Readmission Risk Prevention Plan 01/06/2021  Medication Review (RN Care Manager) Complete  HRI or [place] Complete  SW Recovery Care/Counseling Consult Complete  Palliative Care Screening Not [place] Not Applicable  Some recent data might be hidden

## 2021-04-25 NOTE — ED Notes (Addendum)
O2 tank and machine delivered to pt bedside. Called [facility]. Discharge and [facility] transport paperwork at nurse station. Pt is 7th on [facility]s pick up list. Called report to Wells Guiles, the patient care coordinator, at [facility].

## 2021-04-25 NOTE — ED Provider Notes (Signed)
[facility] DEPT Provider Note   CSN: [id number] Arrival date & time: 04/25/21  1214     History Chief Complaint  Patient presents with   No oxygen at facility   Cough    Charles [name] is a 55 y.o. male.  HPI  Patient presents with running out of oxygen and with cough.  States that he is usually on 2 L of oxygen at home, he resides at [facility].  He states that he may not have his oxygen and they are unable to set him up.  He has been having a cough that started this morning he did test negative for COVID this morning as well.  He has a history of lung cancer, tachycardia, diabetes type 2.  He is denying any fevers, chills, shortness of breath at this time.  Past Medical History:  Diagnosis Date   Arthritis    lower back   Bipolar 1 disorder ([place])    Cancer ([place])    Cardiomyopathy, nonischemic ([place])    no CAD 10/14/20 cath, EF 20-25% by 10/11/20 echo   CHF (congestive heart failure) ([place])    COVID 10/2020   Depression    DM2 (diabetes mellitus, type 2) ([place])    HTN (hypertension)    Myocardial infarction [facility])     Patient Active Problem List   Diagnosis Date Noted   Homelessness 03/21/2021   Suicidal ideations 03/20/2021   MDD (major depressive disorder), recurrent, severe, with psychosis ([place]) 02/28/2021   Acute hypoxemic respiratory failure ([place]) 01/13/2021   Acute on chronic heart failure with reduced ejection fraction and diastolic dysfunction ([place]) 01/12/2021   Schizophrenia ([place]) 01/04/2021   Diarrhea 01/04/2021   Pulmonary edema    Mediastinal adenopathy 12/18/2020   Adjustment disorder with mixed anxiety and depressed mood 10/30/2020   Mediastinal lymphadenopathy 10/11/2020   Shortness of breath 10/11/2020   Sinus tachycardia 10/11/2020   Night sweats 10/11/2020   DM2 (diabetes mellitus, type 2) ([place]) 10/11/2020   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[place]) 10/11/2020   Hypertension  09/25/2020   BPH (benign prostatic hyperplasia) 09/25/2020   Insomnia 09/25/2020    Past Surgical History:  Procedure Laterality Date   BACK SURGERY  01/2020   CARDIAC CATHETERIZATION  10/2020   CHOLECYSTECTOMY     HERNIA REPAIR     INTERCOSTAL NERVE BLOCK Left 12/18/2020   Procedure: INTERCOSTAL NERVE BLOCK;  Surgeon: Melrose Nakayama, MD;  Location: [facility];  Service: Thoracic;  Laterality: Left;   LYMPH NODE BIOPSY Left 12/18/2020   Procedure: LYMPH NODE BIOPSY;  Surgeon: Melrose Nakayama, MD;  Location[facility];  Service: Thoracic;  Laterality: Left;   RIGHT/LEFT HEART CATH AND CORONARY ANGIOGRAPHY N/A 10/14/2020   Procedure: RIGHT/LEFT HEART CATH AND CORONARY ANGIOGRAPHY;  Surgeon: Jettie Booze, MD;  Location: [facility] CV LAB;  Service: Cardiovascular;  Laterality: N/A;   SPINAL FIXATION SURGERY  02/26/2020   XI ROBOTIC ASSISTED THORASCOPY FOR BIOPSY AP WINDOW LYMPH NODES (Left)  12/18/2020       Family History  Problem Relation Age of Onset   Hypertension Mother    Hyperlipidemia Mother    Stroke Mother    Cancer Mother    Hypertension Father    Hyperlipidemia Father    Healthy Sister     Social History   Tobacco Use   Smoking status: Some Days    Packs/day: 0.25    Pack years: 0.00    Types: Cigarettes  Smokeless tobacco: Never   Tobacco comments:    occasionally  Vaping Use   Vaping Use: Never used  Substance Use Topics   Alcohol use: Never   Drug use: Not Currently    Types: Cocaine    Home Medications Prior to Admission medications   Medication Sig Start Date End Date Taking? Authorizing Provider  acetaminophen (TYLENOL) 325 MG tablet Take 650 mg by mouth every 6 (six) hours as needed for moderate pain.    [provider]  atorvastatin (LIPITOR) 10 MG tablet Take 10 mg by mouth daily. 09/30/20   [provider]  blood glucose meter kit and supplies KIT Dispense based on patient and insurance preference. Use up to four  times daily as directed. (FOR ICD-9 250.00, 250.01). 10/23/20   Barb Merino, MD  brimonidine (ALPHAGAN) 0.2 % ophthalmic solution Place 1 drop into both eyes 2 (two) times daily.     [provider]  dapagliflozin propanediol (FARXIGA) 10 MG TABS tablet Take 1 tablet (10 mg total) by mouth daily. 10/23/20   Barb Merino, MD  digoxin (LANOXIN) 0.125 MG tablet Take 1 tablet (125 mcg total) by mouth daily. Patient taking differently: Take 0.125 mg by mouth daily. 12/29/20 12/29/21  Clegg, Amy D, NP  furosemide (LASIX) 80 MG tablet TAKE 1 TABLET (80 MG TOTAL) BY MOUTH TWO TIMES DAILY. Patient taking differently: Take 80 mg by mouth daily. 12/23/20 12/23/21  Gold, Wilder Glade, PA-C  Insulin Pen Needle (PEN NEEDLES 29GX1/2") 29G X 12MM MISC 1 each by Does not apply route daily. 10/23/20   Barb Merino, MD  Insulin Pen Needle 32G X 4 MM MISC USE AS DIRECTED. 10/23/20 10/23/21  Barb Merino, MD  levothyroxine (SYNTHROID) 100 MCG tablet Take 100 mcg by mouth daily before breakfast.    [provider]  melatonin 3 MG TABS tablet Take 3 mg by mouth at bedtime. 09/08/20   [provider]  metoprolol succinate (TOPROL-XL) 25 MG 24 hr tablet TAKE 3 TABLETS (75 MG TOTAL) BY MOUTH DAILY. Patient taking differently: Take 75 mg by mouth daily. 01/16/21 01/16/22  [name], MD  pantoprazole (PROTONIX) 40 MG tablet Take 40 mg by mouth daily before breakfast.    [provider]  polyethylene glycol (MIRALAX / GLYCOLAX) 17 g packet Take 17 g by mouth 2 (two) times daily. 10/23/20   Barb Merino, MD  polyethylene glycol powder (GLYCOLAX/MIRALAX) 17 GM/SCOOP powder TAKE 17 G BY MOUTH 2 (TWO) TIMES DAILY. Patient taking differently: Take 17 g by mouth in the morning and at bedtime. 10/23/20 10/23/21  Barb Merino, MD  potassium chloride SA (KLOR-CON) 20 MEQ tablet TAKE 2 TABLETS (40 MEQ TOTAL) BY MOUTH DAILY. Patient taking differently: Take 40 mEq by mouth daily. 12/23/20 12/23/21   Jadene Pierini E, PA-C  risperiDONE (RISPERDAL) 0.5 MG tablet Take 1 tablet (0.5 mg total) by mouth at bedtime. 12/14/20   Lucrezia Starch, MD  sacubitril-valsartan (ENTRESTO) 24-26 MG TAKE 1 TABLET BY MOUTH TWO TIMES DAILY. 10/23/20 10/23/21  Barb Merino, MD  senna (SENOKOT) 8.6 MG TABS tablet Take 2 tablets (17.2 mg total) by mouth 2 (two) times daily. 10/23/20   Barb Merino, MD  spironolactone (ALDACTONE) 25 MG tablet Take 0.5 tablets (12.5 mg total) by mouth daily. 01/07/21   Madalyn Rob, MD  tamsulosin (FLOMAX) 0.4 MG CAPS capsule Take 0.4 mg by mouth in the morning. 09/08/20   [provider]  traMADol (ULTRAM) 50 MG tablet TAKE 1-2 TABLETS (50-100 MG TOTAL)  BY MOUTH EVERY TWELVE HOURS AS NEEDED (MILD PAIN). Patient taking differently: Take 50-100 mg by mouth every 12 (twelve) hours as needed for moderate pain. 12/23/20 06/15/2021  John Giovanni, PA-C    Allergies    Ibuprofen and Penicillins  Review of Systems   Review of Systems  Constitutional:  Negative for fever.  Respiratory:  Positive for cough and shortness of breath.    Physical Exam Updated Vital Signs BP 112/84   Pulse (!) 110   Temp 97.9 F (36.6 C) (Oral)   Resp 18   Ht 5' 9"  (1.753 m)   Wt 104.3 kg   SpO2 90%   BMI 33.97 kg/m   Physical Exam Vitals and nursing note reviewed. Exam conducted with a chaperone present.  Constitutional:      General: He is not in acute distress.    Appearance: Normal appearance.  HENT:     Head: Normocephalic and atraumatic.  Eyes:     General: No scleral icterus.    Extraocular Movements: Extraocular movements intact.     Pupils: Pupils are equal, round, and reactive to light.  Cardiovascular:     Rate and Rhythm: Tachycardia present.     Pulses: Normal pulses.     Heart sounds: Normal heart sounds.  Pulmonary:     Effort: Pulmonary effort is normal.     Breath sounds: Normal breath sounds.     Comments: [name] speaking in complete sentences.  Lungs are  clear to auscultation bilaterally Skin:    Coloration: Skin is not jaundiced.  Neurological:     Mental Status: He is alert. Mental status is at baseline.     Coordination: Coordination normal.    ED Results / Procedures / Treatments   Labs (all labs ordered are listed, but only abnormal results are displayed) Labs Reviewed  SARS CORONAVIRUS 2 (TAT 6-24 HRS)    EKG None  Radiology DG Chest Portable 1 View  Result Date: 04/25/2021 CLINICAL DATA:  Short of breath EXAM: PORTABLE CHEST 1 VIEW COMPARISON:  03/18/2021 FINDINGS: Cardiac enlargement. Progression of vascular congestion with perihilar edema. Progression of left lower lobe density due to airspace disease and progressive left effusion. No right effusion. IMPRESSION: Progressive congestive heart failure with edema and left effusion. Left lower lobe atelectasis/infiltrate also has progressed. Electronically Signed   By: Franchot Gallo M.D.   On: 04/25/2021 14:57    Procedures Procedures   Medications Ordered in ED Medications  sodium chloride 0.9 % bolus 1,000 mL (has no administration in time range)    ED Course  I have reviewed the triage vital signs and the nursing notes.  Pertinent labs & imaging results that were available during my care of the patient were reviewed by me and considered in my medical decision making (see chart for details).  Clinical Course as of 04/25/21 1600  Sat Apr 25, 2021  1442 Social work was able to obtain an additional thing.  They are on route now to set it up at the care facility.  On supplemental oxygen arrives at the emergency department, patient will likely be able to return back to the facility is pending his x-ray result. [HS]  3154 DG Chest Portable 1 View Known history of HF. Nothing acute.  [HS]    Clinical Course User Index [HS] Sherrill Raring, PA-C   MDM Rules/Calculators/A&P  [name] a 55 year old male with history of diabetes type 2, lung cancer,  tachycardia who is presenting for running out of oxygen at his long-term rehab.  He also has a cough.  Patient appears to be presenting with a chronic complaint.  He still actively short of breath, although he is mildly tachycardic to 104.  He is satting in 100% on 2 L of oxygen while I am in the room with him.  He is not in respiratory distress and the lungs are relatively clear to auscultation bilaterally.  I suspect there is no acute pathology, but I will get a chest x-ray to see if there are any changes from his last one.  I do not think labs would be helpful at this point.  Per chart review, patient is always tachycardic to the 110s.  Not overly concerned given her chest pain symptoms.  Social work is assisting with getting patient set up with ambulatory.  I spoke with Alma Friendly from social work and she is able to help me get home oxygen for the patient.  Patient given some Robitussin for his cough.  He is stable and appropriate for discharge.  You have been able to obtain oxygen for him and set him up in his facility.  [name] agreeable to discharge.  Return precautions were given.  Final Clinical Impression(s) / ED Diagnoses Final diagnoses:  Cough    Rx / DC Orders ED Discharge Orders     None        Sherrill Raring, Hershal Coria 04/25/21 1614    Milton Ferguson, MD 04/27/21 [phone]

## 2021-04-25 NOTE — ED Triage Notes (Addendum)
[facility] transferred pt from [facility] and reports the following.   Pt at long term rehab. They ran out of oxygen, so they EMS for transport to hospital. Pt reporting cough that started this morning. Yesterday, pt tested negative for COVID-19. Hx lung cancer, tachycardia, DM2.

## 2021-04-25 NOTE — ED Notes (Addendum)
ATURATION QUALIFICATIONS: (This note is used to comply with regulatory documentation for home oxygen)   Patient Saturations on Room Air at Rest = 96%   Patient Saturations on Room Air while Ambulating = 87%   Patient Saturations on 2 Liters of oxygen while Ambulating = 96%   Please briefly explain why patientneeds home oxygen: [name] hypoxic on room air. He has lung cancer and is unable to properly ventilate without assistant of supplemental oxygen.

## 2021-04-25 NOTE — Discharge Instructions (Addendum)
You were seen today for decrease in oxygen.  We were able to set you up with home oxygen care facility.  We have also replaced the oxygen here in the ED.  Chest x-ray had no new findings, and we are comfortable letting you return back to your facility.  Conditions worsen he can always follow-up with [place].

## 2021-04-25 NOTE — Progress Notes (Signed)
CSW contacted [facility] to find out about patients oxygen. CSW spoke with Hydea in admissions and confirmed they were out of oxygen on site. Hydea stated when patient was transferred to them by a different hospital that they did not put in the order that patient needed continuous oxygen. Hydea stated that she has gone through her back-up tanks and she was supposed to get some oxygen delivered yesterday but it never came. CSW asked if the hospital can provide the oxygen can the patient come back and she stated yes that is no problem. Hydea stated she has been on hold with [employer] about getting more oxygen all morning. CSW informed RNCM and she will be working on the oxygen order. Once patient has his oxygen he can be discharged back to [facility] facility in [place].

## 2021-04-26 LAB — SARS CORONAVIRUS 2 (TAT 6-24 HRS): SARS Coronavirus 2: NEGATIVE

## 2021-04-29 ENCOUNTER — Encounter (HOSPITAL_COMMUNITY): Payer: Self-pay

## 2021-04-29 ENCOUNTER — Emergency Department (HOSPITAL_COMMUNITY): Payer: Medicaid Other

## 2021-04-29 ENCOUNTER — Inpatient Hospital Stay (HOSPITAL_COMMUNITY)
Admission: EM | Admit: 2021-04-29 | Discharge: 2021-05-04 | DRG: 291 | Disposition: A | Discharge status: Hospice | Payer: Medicaid Other | Source: Skilled Nursing Facility | Attending: Critical Care Medicine | Admitting: Critical Care Medicine

## 2021-04-29 ENCOUNTER — Other Ambulatory Visit: Payer: Self-pay

## 2021-04-29 DIAGNOSIS — C8102 Nodular lymphocyte predominant Hodgkin lymphoma, intrathoracic lymph nodes: Secondary | ICD-10-CM | POA: Diagnosis present

## 2021-04-29 DIAGNOSIS — Z8616 Personal history of COVID-19: Secondary | ICD-10-CM | POA: Diagnosis not present

## 2021-04-29 DIAGNOSIS — Z79890 Hormone replacement therapy (postmenopausal): Secondary | ICD-10-CM

## 2021-04-29 DIAGNOSIS — Z7901 Long term (current) use of anticoagulants: Secondary | ICD-10-CM

## 2021-04-29 DIAGNOSIS — Z86711 Personal history of pulmonary embolism: Secondary | ICD-10-CM

## 2021-04-29 DIAGNOSIS — I110 Hypertensive heart disease with heart failure: Principal | ICD-10-CM | POA: Diagnosis present

## 2021-04-29 DIAGNOSIS — E785 Hyperlipidemia, unspecified: Secondary | ICD-10-CM | POA: Diagnosis present

## 2021-04-29 DIAGNOSIS — E039 Hypothyroidism, unspecified: Secondary | ICD-10-CM | POA: Diagnosis present

## 2021-04-29 DIAGNOSIS — F17210 Nicotine dependence, cigarettes, uncomplicated: Secondary | ICD-10-CM | POA: Diagnosis present

## 2021-04-29 DIAGNOSIS — I428 Other cardiomyopathies: Secondary | ICD-10-CM | POA: Diagnosis present

## 2021-04-29 DIAGNOSIS — F333 Major depressive disorder, recurrent, severe with psychotic symptoms: Secondary | ICD-10-CM | POA: Diagnosis not present

## 2021-04-29 DIAGNOSIS — J9621 Acute and chronic respiratory failure with hypoxia: Secondary | ICD-10-CM | POA: Diagnosis present

## 2021-04-29 DIAGNOSIS — F200 Paranoid schizophrenia: Secondary | ICD-10-CM | POA: Diagnosis present

## 2021-04-29 DIAGNOSIS — C8112 Nodular sclerosis classical Hodgkin lymphoma, intrathoracic lymph nodes: Secondary | ICD-10-CM | POA: Diagnosis not present

## 2021-04-29 DIAGNOSIS — E119 Type 2 diabetes mellitus without complications: Secondary | ICD-10-CM | POA: Diagnosis present

## 2021-04-29 DIAGNOSIS — I252 Old myocardial infarction: Secondary | ICD-10-CM

## 2021-04-29 DIAGNOSIS — R0602 Shortness of breath: Secondary | ICD-10-CM | POA: Diagnosis present

## 2021-04-29 DIAGNOSIS — Z6834 Body mass index (BMI) 34.0-34.9, adult: Secondary | ICD-10-CM | POA: Diagnosis not present

## 2021-04-29 DIAGNOSIS — I1310 Hypertensive heart and chronic kidney disease without heart failure, with stage 1 through stage 4 chronic kidney disease, or unspecified chronic kidney disease: Secondary | ICD-10-CM

## 2021-04-29 DIAGNOSIS — Z8249 Family history of ischemic heart disease and other diseases of the circulatory system: Secondary | ICD-10-CM

## 2021-04-29 DIAGNOSIS — N400 Enlarged prostate without lower urinary tract symptoms: Secondary | ICD-10-CM | POA: Diagnosis present

## 2021-04-29 DIAGNOSIS — R570 Cardiogenic shock: Secondary | ICD-10-CM | POA: Diagnosis present

## 2021-04-29 DIAGNOSIS — N179 Acute kidney failure, unspecified: Secondary | ICD-10-CM | POA: Diagnosis present

## 2021-04-29 DIAGNOSIS — I5084 End stage heart failure: Secondary | ICD-10-CM | POA: Diagnosis present

## 2021-04-29 DIAGNOSIS — Z823 Family history of stroke: Secondary | ICD-10-CM

## 2021-04-29 DIAGNOSIS — E669 Obesity, unspecified: Secondary | ICD-10-CM | POA: Diagnosis present

## 2021-04-29 DIAGNOSIS — M47816 Spondylosis without myelopathy or radiculopathy, lumbar region: Secondary | ICD-10-CM | POA: Diagnosis present

## 2021-04-29 DIAGNOSIS — Z7189 Other specified counseling: Secondary | ICD-10-CM | POA: Diagnosis not present

## 2021-04-29 DIAGNOSIS — Z20822 Contact with and (suspected) exposure to covid-19: Secondary | ICD-10-CM | POA: Diagnosis present

## 2021-04-29 DIAGNOSIS — I5043 Acute on chronic combined systolic (congestive) and diastolic (congestive) heart failure: Secondary | ICD-10-CM | POA: Diagnosis present

## 2021-04-29 DIAGNOSIS — Z66 Do not resuscitate: Secondary | ICD-10-CM | POA: Diagnosis not present

## 2021-04-29 DIAGNOSIS — C8190 Hodgkin lymphoma, unspecified, unspecified site: Secondary | ICD-10-CM

## 2021-04-29 DIAGNOSIS — E1100 Type 2 diabetes mellitus with hyperosmolarity without nonketotic hyperglycemic-hyperosmolar coma (NKHHC): Secondary | ICD-10-CM | POA: Diagnosis not present

## 2021-04-29 DIAGNOSIS — I472 Ventricular tachycardia: Secondary | ICD-10-CM | POA: Diagnosis not present

## 2021-04-29 DIAGNOSIS — I10 Essential (primary) hypertension: Secondary | ICD-10-CM | POA: Diagnosis present

## 2021-04-29 DIAGNOSIS — Z79899 Other long term (current) drug therapy: Secondary | ICD-10-CM

## 2021-04-29 DIAGNOSIS — I5022 Chronic systolic (congestive) heart failure: Secondary | ICD-10-CM | POA: Diagnosis not present

## 2021-04-29 DIAGNOSIS — I5021 Acute systolic (congestive) heart failure: Secondary | ICD-10-CM

## 2021-04-29 DIAGNOSIS — E871 Hypo-osmolality and hyponatremia: Secondary | ICD-10-CM | POA: Diagnosis present

## 2021-04-29 DIAGNOSIS — R053 Chronic cough: Secondary | ICD-10-CM | POA: Diagnosis present

## 2021-04-29 DIAGNOSIS — F329 Major depressive disorder, single episode, unspecified: Secondary | ICD-10-CM | POA: Diagnosis present

## 2021-04-29 DIAGNOSIS — Z9049 Acquired absence of other specified parts of digestive tract: Secondary | ICD-10-CM

## 2021-04-29 DIAGNOSIS — E872 Acidosis: Secondary | ICD-10-CM | POA: Diagnosis present

## 2021-04-29 DIAGNOSIS — E876 Hypokalemia: Secondary | ICD-10-CM | POA: Diagnosis not present

## 2021-04-29 DIAGNOSIS — Z515 Encounter for palliative care: Secondary | ICD-10-CM | POA: Diagnosis not present

## 2021-04-29 DIAGNOSIS — Z880 Allergy status to penicillin: Secondary | ICD-10-CM

## 2021-04-29 DIAGNOSIS — Z8349 Family history of other endocrine, nutritional and metabolic diseases: Secondary | ICD-10-CM

## 2021-04-29 DIAGNOSIS — Z7984 Long term (current) use of oral hypoglycemic drugs: Secondary | ICD-10-CM

## 2021-04-29 DIAGNOSIS — Z923 Personal history of irradiation: Secondary | ICD-10-CM

## 2021-04-29 DIAGNOSIS — Z886 Allergy status to analgesic agent status: Secondary | ICD-10-CM

## 2021-04-29 LAB — I-STAT VENOUS BLOOD GAS, ED
Acid-base deficit: 1 mmol/L (ref 0.0–2.0)
Bicarbonate: 23 mmol/L (ref 20.0–28.0)
Calcium, Ion: 1.11 mmol/L — ABNORMAL LOW (ref 1.15–1.40)
HCT: 46 % (ref 39.0–52.0)
Hemoglobin: 15.6 g/dL (ref 13.0–17.0)
O2 Saturation: 97 %
Potassium: 4.4 mmol/L (ref 3.5–5.1)
Sodium: 132 mmol/L — ABNORMAL LOW (ref 135–145)
TCO2: 24 mmol/L (ref 22–32)
pCO2, Ven: 37.1 mmHg — ABNORMAL LOW (ref 44.0–60.0)
pH, Ven: 7.4 (ref 7.250–7.430)
pO2, Ven: 95 mmHg — ABNORMAL HIGH (ref 32.0–45.0)

## 2021-04-29 LAB — GLUCOSE, CAPILLARY: Glucose-Capillary: 124 mg/dL — ABNORMAL HIGH (ref 70–99)

## 2021-04-29 LAB — I-STAT CHEM 8, ED
BUN: 28 mg/dL — ABNORMAL HIGH (ref 6–20)
Calcium, Ion: 1.09 mmol/L — ABNORMAL LOW (ref 1.15–1.40)
Chloride: 100 mmol/L (ref 98–111)
Creatinine, Ser: 1.7 mg/dL — ABNORMAL HIGH (ref 0.61–1.24)
Glucose, Bld: 146 mg/dL — ABNORMAL HIGH (ref 70–99)
HCT: 47 % (ref 39.0–52.0)
Hemoglobin: 16 g/dL (ref 13.0–17.0)
Potassium: 4.4 mmol/L (ref 3.5–5.1)
Sodium: 132 mmol/L — ABNORMAL LOW (ref 135–145)
TCO2: 22 mmol/L (ref 22–32)

## 2021-04-29 LAB — RESP PANEL BY RT-PCR (FLU A&B, COVID) ARPGX2
Influenza A by PCR: NEGATIVE
Influenza B by PCR: NEGATIVE
SARS Coronavirus 2 by RT PCR: NEGATIVE

## 2021-04-29 LAB — TSH: TSH: 3.61 u[IU]/mL (ref 0.350–4.500)

## 2021-04-29 LAB — COMPREHENSIVE METABOLIC PANEL
ALT: 36 U/L (ref 0–44)
AST: 25 U/L (ref 15–41)
Albumin: 3.1 g/dL — ABNORMAL LOW (ref 3.5–5.0)
Alkaline Phosphatase: 162 U/L — ABNORMAL HIGH (ref 38–126)
Anion gap: 14 (ref 5–15)
BUN: 27 mg/dL — ABNORMAL HIGH (ref 6–20)
CO2: 20 mmol/L — ABNORMAL LOW (ref 22–32)
Calcium: 9 mg/dL (ref 8.9–10.3)
Chloride: 98 mmol/L (ref 98–111)
Creatinine, Ser: 1.73 mg/dL — ABNORMAL HIGH (ref 0.61–1.24)
GFR, Estimated: 45 mL/min — ABNORMAL LOW (ref >60)
Glucose, Bld: 149 mg/dL — ABNORMAL HIGH (ref 70–99)
Potassium: 4.5 mmol/L (ref 3.5–5.1)
Sodium: 132 mmol/L — ABNORMAL LOW (ref 135–145)
Total Bilirubin: 1 mg/dL (ref 0.3–1.2)
Total Protein: 6.8 g/dL (ref 6.5–8.1)

## 2021-04-29 LAB — CBC WITH DIFFERENTIAL/PLATELET
Abs Immature Granulocytes: 0.05 10*3/uL (ref 0.00–0.07)
Basophils Absolute: 0.1 10*3/uL (ref 0.0–0.1)
Basophils Relative: 1 %
Eosinophils Absolute: 0.2 10*3/uL (ref 0.0–0.5)
Eosinophils Relative: 2 %
HCT: 43.2 % (ref 39.0–52.0)
Hemoglobin: 13.8 g/dL (ref 13.0–17.0)
Immature Granulocytes: 1 %
Lymphocytes Relative: 9 %
Lymphs Abs: 0.8 10*3/uL (ref 0.7–4.0)
MCH: 24.9 pg — ABNORMAL LOW (ref 26.0–34.0)
MCHC: 31.9 g/dL (ref 30.0–36.0)
MCV: 78 fL — ABNORMAL LOW (ref 80.0–100.0)
Monocytes Absolute: 0.5 10*3/uL (ref 0.1–1.0)
Monocytes Relative: 5 %
Neutro Abs: 7.2 10*3/uL (ref 1.7–7.7)
Neutrophils Relative %: 82 %
Platelets: 291 10*3/uL (ref 150–400)
RBC: 5.54 MIL/uL (ref 4.22–5.81)
RDW: 18.2 % — ABNORMAL HIGH (ref 11.5–15.5)
WBC: 8.7 10*3/uL (ref 4.0–10.5)
nRBC: 0.3 % — ABNORMAL HIGH (ref 0.0–0.2)

## 2021-04-29 LAB — CBG MONITORING, ED: Glucose-Capillary: 133 mg/dL — ABNORMAL HIGH (ref 70–99)

## 2021-04-29 LAB — HEMOGLOBIN A1C
Hgb A1c MFr Bld: 7.3 % — ABNORMAL HIGH (ref 4.8–5.6)
Mean Plasma Glucose: 162.81 mg/dL

## 2021-04-29 LAB — BRAIN NATRIURETIC PEPTIDE: B Natriuretic Peptide: 1156.5 pg/mL — ABNORMAL HIGH (ref 0.0–100.0)

## 2021-04-29 LAB — LACTIC ACID, PLASMA
Lactic Acid, Venous: 2.6 mmol/L (ref 0.5–1.9)
Lactic Acid, Venous: 1.5 mmol/L (ref 0.5–1.9)

## 2021-04-29 LAB — TROPONIN I (HIGH SENSITIVITY): Troponin I (High Sensitivity): 18 ng/L — ABNORMAL HIGH (ref <18)

## 2021-04-29 MED ORDER — DIGOXIN 125 MCG PO TABS
125.0000 ug | ORAL_TABLET | Freq: Every day | ORAL | Status: DC
  Administered 2021-04-30 – 2021-05-04 (×5): 125 ug via ORAL
  Filled 2021-04-29 (×5): qty 1

## 2021-04-29 MED ORDER — PANTOPRAZOLE SODIUM 40 MG IV SOLR
40.0000 mg | Freq: Every day | INTRAVENOUS | Status: DC
  Administered 2021-04-29: 40 mg via INTRAVENOUS
  Filled 2021-04-29: qty 40

## 2021-04-29 MED ORDER — ATORVASTATIN CALCIUM 10 MG PO TABS
10.0000 mg | ORAL_TABLET | Freq: Every day | ORAL | Status: DC
  Administered 2021-04-30 – 2021-05-04 (×5): 10 mg via ORAL
  Filled 2021-04-29 (×5): qty 1

## 2021-04-29 MED ORDER — POLYETHYLENE GLYCOL 3350 17 G PO PACK: 17.0000 g | PACK | Freq: Every day | ORAL | Status: DC | PRN

## 2021-04-29 MED ORDER — ALBUTEROL SULFATE HFA 108 (90 BASE) MCG/ACT IN AERS
2.0000 | INHALATION_SPRAY | Freq: Once | RESPIRATORY_TRACT | Status: AC
  Administered 2021-04-29: 2 via RESPIRATORY_TRACT
  Filled 2021-04-29: qty 6.7

## 2021-04-29 MED ORDER — MILRINONE LACTATE IN DEXTROSE 20-5 MG/100ML-% IV SOLN
0.5000 ug/kg/min | INTRAVENOUS | Status: DC
  Administered 2021-04-29: 0.25 ug/kg/min via INTRAVENOUS
  Filled 2021-04-29 (×2): qty 100

## 2021-04-29 MED ORDER — DOCUSATE SODIUM 100 MG PO CAPS: 100.0000 mg | ORAL_CAPSULE | Freq: Two times a day (BID) | ORAL | Status: DC | PRN

## 2021-04-29 MED ORDER — HEPARIN SODIUM (PORCINE) 5000 UNIT/ML IJ SOLN: 5000.0000 [IU] | Freq: Three times a day (TID) | INTRAMUSCULAR | Status: DC

## 2021-04-29 MED ORDER — INSULIN ASPART 100 UNIT/ML IJ SOLN
0.0000 [IU] | INTRAMUSCULAR | Status: DC
  Administered 2021-04-29 – 2021-05-03 (×16): 2 [IU] via SUBCUTANEOUS
  Administered 2021-05-04 (×2): 3 [IU] via SUBCUTANEOUS
  Administered 2021-05-04: 2 [IU] via SUBCUTANEOUS
  Administered 2021-05-04: 3 [IU] via SUBCUTANEOUS

## 2021-04-29 MED ORDER — LEVALBUTEROL HCL 0.63 MG/3ML IN NEBU
0.6300 mg | INHALATION_SOLUTION | Freq: Four times a day (QID) | RESPIRATORY_TRACT | Status: DC | PRN
  Administered 2021-04-29: 0.63 mg via RESPIRATORY_TRACT
  Filled 2021-04-29: qty 3

## 2021-04-29 MED ORDER — FUROSEMIDE 10 MG/ML IJ SOLN
60.0000 mg | Freq: Once | INTRAMUSCULAR | Status: AC
  Administered 2021-04-29: 60 mg via INTRAVENOUS
  Filled 2021-04-29: qty 6

## 2021-04-29 MED ORDER — SODIUM CHLORIDE 0.9 % IV BOLUS: 500.0000 mL | Freq: Once | INTRAVENOUS | Status: DC

## 2021-04-29 MED ORDER — LEVOTHYROXINE SODIUM 100 MCG PO TABS
100.0000 ug | ORAL_TABLET | Freq: Every day | ORAL | Status: DC
  Administered 2021-04-30 – 2021-05-04 (×5): 100 ug via ORAL
  Filled 2021-04-29 (×5): qty 1

## 2021-04-29 NOTE — ED Notes (Signed)
Notified provider about pt tachycardia.

## 2021-04-29 NOTE — ED Notes (Signed)
RT placed pt on BIPAP V60 per MD order on settings of 10/8 BUR 12 FIO2 30%. Pt tolerating well. MD not wanting ABG at this time. Pt on BIPAP d/t fluid. RT will continue to monitor.

## 2021-04-29 NOTE — ED Notes (Signed)
Called RT to help with transport

## 2021-04-29 NOTE — ED Notes (Signed)
RT came to replace CPAP mask.

## 2021-04-29 NOTE — Consult Note (Signed)
CONSULTATION NOTE   Patient Name: Charles [name] Date of Encounter: 04/29/2021 Cardiologist: [name], MD Electrophysiologist: None Advanced Heart Failure: Darrick Grinder, NP   Chief Complaint   Cough, dyspnea  Patient Profile   55 y.o. male  with PMHx of HFrEF (EF 20-25%) due to NICM, hypertension, diabetes mellitus, recent diagnosis of Hodgkin's lymphoma, and schizophrenia with recurrent suidical ideation, who presented with cough and shortness of breath  HPI   Charles [name] is a 55 y.o. male who is being seen today for the evaluation of cough and shortness of breath at the request of Dr. Roslynn Amble. This is a 55 y.o. male  with PMHx of HFrEF (EF 20-25%) due to NICM, hypertension, diabetes mellitus, recent diagnosis of Hodgkin's lymphoma, and schizophrenia with recurrent suidical ideation, who presented with cough and shortness of breath.  According to a recent consult note by Dr. Gasper Sells, the history is as follows:  "Admitted December 2021 with acute systolic heart failure.  EF was 20 to 25%.  Cardiac cath with normal coronaries.    Seen by Dr. Roxan Hockey for mediastinal adenopathy and underwent robotic assisted lymph node biopsy and admission for CHF exacerbation.    Seen in heart failure clinic December 29, 2020 by Darrick Grinder, NP.  Added Lasix 40 mg daily and digoxin 0.125 mg daily.  Restarted spironolactone.  Plan to add ivabradine if remains tachycardic at follow-up.   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  Pending evaluation by radiation oncology."  He was followed until mid-March. He has also been seen at [facility] and is followed by [facility] in [place].  He presented with shortness of breath, tachycardia and cough since the weekend. He has been noted to be hypotensive and tachycardic in the ER. VBG is not bad at pH 7.4/37.1/95/23/+1. Sodium is low at 132 and creatinine is elevated at 1.7  (up from 1.25 in May). BNP is high at 1156.5. No leukocytosis. COVID and viral respiratory panels are negative. CXR shows cardiomegaly and pulmonary edema.   PMHx   Past Medical History:  Diagnosis Date   Arthritis    lower back   Bipolar 1 disorder ([place])    Cancer ([place])    Cardiomyopathy, nonischemic ([place])    no CAD 10/14/20 cath, EF 20-25% by 10/11/20 echo   CHF (congestive heart failure) ([place])    COVID 10/2020   Depression    DM2 (diabetes mellitus, type 2) ([place])    HTN (hypertension)    Myocardial infarction [facility])     Past Surgical History:  Procedure Laterality Date   BACK SURGERY  01/2020   CARDIAC CATHETERIZATION  10/2020   CHOLECYSTECTOMY     HERNIA REPAIR     INTERCOSTAL NERVE BLOCK Left 12/18/2020   Procedure: INTERCOSTAL NERVE BLOCK;  Surgeon: Melrose Nakayama, MD;  Location: [place];  Service: Thoracic;  Laterality: Left;   LYMPH NODE BIOPSY Left 12/18/2020   Procedure: LYMPH NODE BIOPSY;  Surgeon: Melrose Nakayama, MD;  Location: [place];  Service: Thoracic;  Laterality: Left;   RIGHT/LEFT HEART CATH AND CORONARY ANGIOGRAPHY N/A 10/14/2020   Procedure: RIGHT/LEFT HEART CATH AND CORONARY ANGIOGRAPHY;  Surgeon: Jettie Booze, MD;  Location: [place] [facility];  Service: Cardiovascular;  Laterality: N/A;   SPINAL FIXATION SURGERY  02/26/2020   XI ROBOTIC ASSISTED THORASCOPY FOR BIOPSY AP WINDOW LYMPH NODES (Left)  12/18/2020    FAMHx   Family History  Problem Relation Age  of Onset   Hypertension Mother    Hyperlipidemia Mother    Stroke Mother    Cancer Mother    Hypertension Father    Hyperlipidemia Father    Healthy Sister     SOCHx    reports that he has been smoking cigarettes. He has been smoking an average of 0.25 packs per day. He has never used smokeless tobacco. He reports previous drug use. Drug: Cocaine. He reports that he does not drink alcohol.  Outpatient Medications   No current facility-administered medications on file prior to  encounter.   Current Outpatient Medications on File Prior to Encounter  Medication Sig Dispense Refill   acetaminophen (TYLENOL) 325 MG tablet Take 650 mg by mouth every 6 (six) hours as needed for moderate pain.     atorvastatin (LIPITOR) 10 MG tablet Take 10 mg by mouth daily.     blood glucose meter kit and supplies KIT Dispense based on patient and insurance preference. Use up to four times daily as directed. (FOR ICD-9 250.00, 250.01). 1 each 0   brimonidine (ALPHAGAN) 0.2 % ophthalmic solution Place 1 drop into both eyes 2 (two) times daily.      dapagliflozin propanediol (FARXIGA) 10 MG TABS tablet Take 1 tablet (10 mg total) by mouth daily. 30 tablet 3   digoxin (LANOXIN) 0.125 MG tablet Take 1 tablet (125 mcg total) by mouth daily. (Patient taking differently: Take 0.125 mg by mouth daily.) 30 tablet 11   furosemide (LASIX) 80 MG tablet TAKE 1 TABLET (80 MG TOTAL) BY MOUTH TWO TIMES DAILY. (Patient taking differently: Take 80 mg by mouth daily.) 60 tablet 1   Insulin Pen Needle (PEN NEEDLES 29GX1/2") 29G X 12MM MISC 1 each by Does not apply route daily. 100 each 0   Insulin Pen Needle 32G X 4 MM MISC USE AS DIRECTED. 100 each 0   levothyroxine (SYNTHROID) 100 MCG tablet Take 100 mcg by mouth daily before breakfast.     melatonin 3 MG TABS tablet Take 3 mg by mouth at bedtime.     metoprolol succinate (TOPROL-XL) 25 MG 24 hr tablet TAKE 3 TABLETS (75 MG TOTAL) BY MOUTH DAILY. (Patient taking differently: Take 75 mg by mouth daily.) 90 tablet 1   pantoprazole (PROTONIX) 40 MG tablet Take 40 mg by mouth daily before breakfast.     polyethylene glycol (MIRALAX / GLYCOLAX) 17 g packet Take 17 g by mouth 2 (two) times daily. 14 each 0   polyethylene glycol powder (GLYCOLAX/MIRALAX) 17 GM/SCOOP powder TAKE 17 G BY MOUTH 2 (TWO) TIMES DAILY. (Patient taking differently: Take 17 g by mouth in the morning and at bedtime.) 510 g 0   potassium chloride SA (KLOR-CON) 20 MEQ tablet TAKE 2 TABLETS (40  MEQ TOTAL) BY MOUTH DAILY. (Patient taking differently: Take 40 mEq by mouth daily.) 60 tablet 1   risperiDONE (RISPERDAL) 0.5 MG tablet Take 1 tablet (0.5 mg total) by mouth at bedtime. 30 tablet 0   sacubitril-valsartan (ENTRESTO) 24-26 MG TAKE 1 TABLET BY MOUTH TWO TIMES DAILY. 60 tablet 0   senna (SENOKOT) 8.6 MG TABS tablet Take 2 tablets (17.2 mg total) by mouth 2 (two) times daily. 120 tablet 0   spironolactone (ALDACTONE) 25 MG tablet Take 0.5 tablets (12.5 mg total) by mouth daily. 30 tablet 0   tamsulosin (FLOMAX) 0.4 MG CAPS capsule Take 0.4 mg by mouth in the morning.     traMADol (ULTRAM) 50 MG tablet TAKE 1-2 TABLETS (50-100 MG TOTAL)  BY MOUTH EVERY TWELVE HOURS AS NEEDED (MILD PAIN). (Patient taking differently: Take 50-100 mg by mouth every 12 (twelve) hours as needed for moderate pain.) 12 tablet 0    Inpatient Medications    Scheduled Meds:  furosemide  60 mg Intravenous Once    Continuous Infusions:    PRN Meds:    ALLERGIES   Allergies  Allergen Reactions   Ibuprofen Other (See Comments)    Unknown per MAR   Penicillins Rash    ROS   Review of systems not obtained due to patient factors.  Vitals   Vitals:   04/29/21 1730 04/29/21 1820 04/29/21 1840 04/29/21 1915  BP: _0 94/66  Pulse: (!) 116 (!) 120 (!) 113 (!) 116  Resp: (!) 23 (!) 33 (!) 23 (!) 39  Temp:      SpO2: 98% 100% 98% 96%  Weight:      Height:       No intake or output data in the 24 hours ending 04/29/21 1939 Filed Weights   04/29/21 1702  Weight: 99.8 kg    Physical Exam   General appearance: alert, fatigued, moderate distress, and morbidly obese Neck: JVD - to 8 cm above sternal notch, no carotid bruit, and thyroid not enlarged, symmetric, no tenderness/mass/nodules Lungs: diminished breath sounds bilaterally, dullness to percussion bilaterally, and rales bibasilar Heart: regular tachycardia, s1/s2, +S3 Abdomen: protuberant, firm Extremities: edema 3+  pitting bilateral LE Pulses: weak distal Skin: cool, dry Neurologic: Mental status: Awake, somewhat lethargic Psych: Flat affect  Labs   Results for orders placed or performed during the hospital encounter of 04/29/21 (from the past 48 hour(s))  Resp Panel by RT-PCR (Flu A&B, Covid) Nasopharyngeal Swab     Status: None   Collection Time: 04/29/21  5:51 PM   Specimen: Nasopharyngeal Swab; Nasopharyngeal(NP) swabs in vial transport medium  Result Value Ref Range   SARS Coronavirus 2 by RT PCR NEGATIVE NEGATIVE    Comment: (NOTE) SARS-CoV-2 target nucleic acids are NOT DETECTED.  The SARS-CoV-2 RNA is generally detectable in upper respiratory specimens during the acute phase of infection. The lowest concentration of SARS-CoV-2 viral copies this assay can detect is 138 copies/mL. A negative result does not preclude SARS-Cov-2 infection and should not be used as the sole basis for treatment or other patient management decisions. A negative result may occur with  improper specimen collection/handling, submission of specimen other than nasopharyngeal swab, presence of viral mutation(s) within the areas targeted by this assay, and inadequate number of viral copies(<138 copies/mL). A negative result must be combined with clinical observations, patient history, and epidemiological information. The expected result is Negative.  Fact Sheet for Patients:  EntrepreneurPulse.com.au  Fact Sheet for Healthcare Providers:  IncredibleEmployment.be  This test is no t yet approved or cleared by the [place] FDA and  has been authorized for detection and/or diagnosis of SARS-CoV-2 by FDA under an Emergency Use Authorization (EUA). This EUA will remain  in effect (meaning this test can be used) for the duration of the COVID-19 declaration under Section 564(b)(1) of the Act, 21 U.S.C.section 360bbb-3(b)(1), unless the authorization is terminated  or revoked  sooner.       Influenza A by PCR NEGATIVE NEGATIVE   Influenza B by PCR NEGATIVE NEGATIVE    Comment: (NOTE) The Xpert Xpress SARS-CoV-2/FLU/RSV plus assay is intended as an aid in the diagnosis of influenza from Nasopharyngeal swab specimens and should not be used as a sole basis for treatment. Nasal  washings and aspirates are unacceptable for Xpert Xpress SARS-CoV-2/FLU/RSV testing.  Fact Sheet for Patients: EntrepreneurPulse.com.au  Fact Sheet for Healthcare Providers: IncredibleEmployment.be  This test is not yet approved or cleared by the [place] FDA and has been authorized for detection and/or diagnosis of SARS-CoV-2 by FDA under an Emergency Use Authorization (EUA). This EUA will remain in effect (meaning this test can be used) for the duration of the COVID-19 declaration under Section 564(b)(1) of the Act, 21 U.S.C. section 360bbb-3(b)(1), unless the authorization is terminated or revoked.  Performed at [facility], [place] [address] [place], [place] [address]   Lactic acid, plasma     Status: Abnormal   Collection Time: 04/29/21  6:03 PM  Result Value Ref Range   Lactic Acid, Venous 2.6 (HH) 0.5 - 1.9 mmol/L    Comment: CRITICAL RESULT CALLED TO, READ BACK BY AND VERIFIED WITH:  Alfonzo Beers, RN, [phone], 04/29/21, [place] Performed at [facility], [place] [address] [place], [place] [address]   CBC with Differential     Status: Abnormal   Collection Time: 04/29/21  6:03 PM  Result Value Ref Range   WBC 8.7 4.0 - 10.5 K/uL   RBC 5.54 4.22 - 5.81 MIL/uL   Hemoglobin 13.8 13.0 - 17.0 g/dL   HCT 43.2 39.0 - 52.0 %   MCV 78.0 (L) 80.0 - 100.0 fL   MCH 24.9 (L) 26.0 - 34.0 pg   MCHC 31.9 30.0 - 36.0 g/dL   RDW 18.2 (H) 11.5 - 15.5 %   Platelets 291 150 - 400 K/uL   nRBC 0.3 (H) 0.0 - 0.2 %   Neutrophils Relative % 82 %   Neutro Abs 7.2 1.7 - 7.7 K/uL   Lymphocytes Relative 9 %   Lymphs Abs 0.8 0.7 - 4.0 K/uL    Monocytes Relative 5 %   Monocytes Absolute 0.5 0.1 - 1.0 K/uL   Eosinophils Relative 2 %   Eosinophils Absolute 0.2 0.0 - 0.5 K/uL   Basophils Relative 1 %   Basophils Absolute 0.1 0.0 - 0.1 K/uL   Immature Granulocytes 1 %   Abs Immature Granulocytes 0.05 0.00 - 0.07 K/uL    Comment: Performed at [facility] [address], [place], [place] [address]  Brain natriuretic peptide     Status: Abnormal   Collection Time: 04/29/21  6:03 PM  Result Value Ref Range   B Natriuretic Peptide 1,156.5 (H) 0.0 - 100.0 pg/mL    Comment: Performed at [facility] [address], [place], [place] [address]  Comprehensive metabolic panel     Status: Abnormal   Collection Time: 04/29/21  6:03 PM  Result Value Ref Range   Sodium 132 (L) 135 - 145 mmol/L   Potassium 4.5 3.5 - 5.1 mmol/L   Chloride 98 98 - 111 mmol/L   CO2 20 (L) 22 - 32 mmol/L   Glucose, Bld 149 (H) 70 - 99 mg/dL    Comment: Glucose reference range applies only to samples taken after fasting for at least 8 hours.   BUN 27 (H) 6 - 20 mg/dL   Creatinine, Ser 1.73 (H) 0.61 - 1.24 mg/dL   Calcium 9.0 8.9 - 10.3 mg/dL   Total Protein 6.8 6.5 - 8.1 g/dL   Albumin 3.1 (L) 3.5 - 5.0 g/dL   AST 25 15 - 41 U/L   ALT 36 0 - 44 U/L   Alkaline Phosphatase 162 (H) 38 - 126 U/L  Total Bilirubin 1.0 0.3 - 1.2 mg/dL   GFR, Estimated 45 (L) >60 mL/min    Comment: (NOTE) Calculated using the CKD-EPI Creatinine Equation (2021)    Anion gap 14 5 - 15    Comment: Performed at [facility] [address], [place], [place] [address]  I-Stat venous blood gas, [facility] ED)     Status: Abnormal   Collection Time: 04/29/21  6:35 PM  Result Value Ref Range   pH, Ven 7.400 7.250 - 7.430   pCO2, Ven 37.1 (L) 44.0 - 60.0 mmHg   pO2, Ven 95.0 (H) 32.0 - 45.0 mmHg   Bicarbonate 23.0 20.0 - 28.0 mmol/L   TCO2 24 22 - 32 mmol/L   O2 Saturation 97.0 %   Acid-base deficit 1.0 0.0 - 2.0 mmol/L   Sodium 132 (L) 135 - 145 mmol/L   Potassium  4.4 3.5 - 5.1 mmol/L   Calcium, Ion 1.11 (L) 1.15 - 1.40 mmol/L   HCT 46.0 39.0 - 52.0 %   Hemoglobin 15.6 13.0 - 17.0 g/dL   Sample type VENOUS   I-stat chem 8, ED (not at [facility] or [facility])     Status: Abnormal   Collection Time: 04/29/21  6:35 PM  Result Value Ref Range   Sodium 132 (L) 135 - 145 mmol/L   Potassium 4.4 3.5 - 5.1 mmol/L   Chloride 100 98 - 111 mmol/L   BUN 28 (H) 6 - 20 mg/dL   Creatinine, Ser 1.70 (H) 0.61 - 1.24 mg/dL   Glucose, Bld 146 (H) 70 - 99 mg/dL    Comment: Glucose reference range applies only to samples taken after fasting for at least 8 hours.   Calcium, Ion 1.09 (L) 1.15 - 1.40 mmol/L   TCO2 22 22 - 32 mmol/L   Hemoglobin 16.0 13.0 - 17.0 g/dL   HCT 47.0 39.0 - 52.0 %    ECG   Sinus tachycardia, LAE, LAFB - Personally Reviewed  Telemetry   Sinus tachycardia - Personally Reviewed  Radiology   DG Chest Portable 1 View  Result Date: 04/29/2021 CLINICAL DATA:  Short of breath, cough.  History of lymphoma EXAM: PORTABLE CHEST 1 VIEW COMPARISON:  Radiograph 04/25/2021 FINDINGS: Cardiac silhouette is enlarged similar prior. There is perihilar densities suggesting lymphadenopathy. There is superimposed central airspace disease which is slightly worse than the prior. IMPRESSION: 1. Mediastinal lymphadenopathy. 2. Increased central airspace disease suggest pulmonary edema. 3. Cardiomegaly. Electronically Signed   By: Suzy Bouchard M.D.   On: 04/29/2021 18:08    Cardiac Studies   None  Impression   Principal Problem:   Cardiogenic shock (HCC) Active Problems:   Hypertension   Shortness of breath   DM2 (diabetes mellitus, type 2) (HCC)   Acute on chronic heart failure with reduced ejection fraction and diastolic dysfunction (HCC)   MDD (major depressive disorder), recurrent, severe, with psychosis ([place])   Cardiorenal syndrome with renal failure   Recommendation   Cardiogenic shock #Currently hypotensive in the ER with signs and symptoms of low  cardiac output.  Labs indicate probable volume overload with hyponatremia, elevated creatinine suggestive of cardiorenal syndrome and elevated venous lactate of 2.6 with high BNP of 1156.5.  He will likely need inotropic therapy and would consider Levophed given his low blood pressure vs milrinone which may not be tolerated. This would be best accomplished tonight in the ICU with PCCM guidance (appreciate their assistance). Will also need aggressive diuresis to re-establish his Starling curve.  Acute on  chronic decompensated systolic congestive heart failure #As above, multiple signs and symptoms of volume overload including significant peripheral edema, pulmonary edema, elevated lactate, high BNP, tachycardia and signs of poor perfusion.  He will need aggressive diuresis with high dose lasix (80 mg IV BID or TID) and inotropic blood pressure support. Will hold Entresto, metoprolol and tamsulosin due to hypotension, continue digoxin - check level due to AKI Cardiorenal syndrome with renal failure #Creatinine 1.7 up from 1.25 last May.  Suspect this will improve with diuresis and improve renal blood flow. Will need to renally adjust medications. DM2 #Was on Farxiga 10 mg and ?insulin, management per PCCM MDDD/Schizophrenia #Known to behavioral health, seen last in May, would continue Risperdal for schizophrenia  Thanks for the consultation. Cardiology will follow with you.  CRITICAL CARE TIME: I have spent a total of 55 minutes with patient reviewing hospital notes, telemetry, EKGs, labs and examining the patient as well as establishing an assessment and plan that was discussed with the patient.  > 50% of time was spent in direct patient care. The patient is critically ill with multi-organ system failure and requires high complexity decision making for assessment and support, frequent evaluation and titration of therapies, application of advanced monitoring technologies and extensive interpretation of  multiple databases.   Length of Stay:  LOS: 0 days   Pixie Casino, MD, [facility], [place] Director of the Advanced Lipid Disorders &  Cardiovascular Risk Reduction Clinic Diplomate of the American Board of Clinical Lipidology Attending Cardiologist  Direct Dial: [phone]  Fax: [phone]  Website:  [email or web address] 04/29/2021, 7:39 PM

## 2021-04-29 NOTE — ED Notes (Signed)
RT replaced pts BIPAP mask d/t pt removing mask and breaking it. RT called report to receiving RT Analisa for 2M04. RT will continue to monitor.

## 2021-04-29 NOTE — ED Notes (Signed)
Updated fiance` per pt request.

## 2021-04-29 NOTE — H&P (Addendum)
NAME:  Charles [name], MRN:  [id number], DOB:  1966/06/15, LOS: 0 ADMISSION DATE:  04/29/2021, CONSULTATION DATE:  6/22 REFERRING MD:  Dr. Roslynn [name], CHIEF COMPLAINT:  SOB/cough   History of Present Illness:  [name] a 55 yo M w/ pertinent PMH of DM2, HTN, HFrEF (EF 20-25%) due to NICM, Hodgkin's lymphoma, and schizophrenia presents to [facility] on 6/22 w/ cough and SOB.  Patient recently admitted in Dec 3361 w/ acute systolic HF w/ EF 22-44%. Patient also had mediastinal adenopathy and underwent lymph node biopsy. February 2022, patient seen in outpatient heart failure clinic and was given 40 Lasix and digoxin 0.125. Was also seen by hematology in February 2022 and was diagnosed stage I nodular lymphocyte Hodgkin's lymphoma. Patient's last radiation therapy given one month ago at [facility]. Patient has had several admissions to hospital for suicidal ideations this year. Patient has also had several admissions for CHF exacerbations this year. On 5/16 patient admitted to [facility] w/ decompensated HFrEF and PE. Was sent home on Apixaban. Patient recently presented to ED on 6/18 with cough/SOB and ran out of his home oxygen. Was given robitussin for cough and O2 was set up for him at his facility and was discharged.  Patient states his cough/sob has been progressively worsening since 04/26/2021. He wears O2 at home intermittently when SOB. He has orthopnea and is unable to walk without getting SOB. Does not take inhalers and denies history of lung disease. He states he has been taking his medications at home. Patient states his BLE are swelling more than usual, but has not been checking his daily weights at home. Denies fever.  Presents to [facility] ER on 6/22. ED course: Patient sob, coughing, and RR in 30s. Placed on 4 liters [place] with sats mid 90s. [name] also hypotensive and EKG shows normal sinus tach. Creatinine is 1.7. BNP 1,156.5. CXR shows cardiomegaly and pulmonary edema. Normal WBC, VBG, and Hgb. Covid and Flu  negative. Cardiology consulted.  PCCM consulted for admission to ICU, management of cardiogenic shock and respiratory management.  Pertinent  Medical History   Past Medical History:  Diagnosis Date   Arthritis    lower back   Bipolar 1 disorder ([place])    Cancer ([place])    Cardiomyopathy, nonischemic ([place])    no CAD 10/14/20 cath, EF 20-25% by 10/11/20 echo   CHF (congestive heart failure) ([place])    COVID 10/2020   Depression    DM2 (diabetes mellitus, type 2) ([place])    HTN (hypertension)    Myocardial infarction ([place])      Significant Hospital Events: Including procedures, antibiotic start and stop dates in addition to other pertinent events   6/22: Patient admitted to [facility] 9/75 for acute systolic CHF exacerbation and cardiogenic shock. Placed on BiPAP. Milrinone and Lasix started  Interim History / Subjective:  Patient short of breath on 4 l/m [place] w/ sats 96% RR mid 30s; coughing BP 70/50  Objective   Blood pressure 94/66, pulse (!) 116, temperature 97.7 F (36.5 C), resp. rate (!) 39, height 5' 9"  (1.753 m), weight 99.8 kg, SpO2 96 %.       No intake or output data in the 24 hours ending 04/29/21 1927 Filed Weights   04/29/21 1702  Weight: 99.8 kg    Examination: General:  Critically ill appearing in respiratory distress.  HEENT: MM pink/moist, [place] in place Neuro: Aox3; able to move all extremities CV: s1s2, NS tach, no m/r/g appreciated PULM:  coarse  BS bilaterally with crackles bilateral bases; On [place] GI: soft, bsx4 active  Extremities: warm/dry, 2+ pitting edema BLE   Labs/imaging that I havepersonally reviewed  (right click and "Reselect all SmartList Selections" daily)  VBG normal K 4.4, Glucose 146 Creatinine 1.70, BUN 28 Alk Phosph 162, bilirubin 1.0 BNP 1,156 Lactic acid 2.6 Troponin pending WBC 8.7, Hgb 13.8 Negative covid/flu CXR: mediastinal lymphadenopathy; increased pulmonary edema; cardiomegaly EKG: NS tach w/ prolonged Qt interval  Resolved  Hospital Problem list     Assessment & Plan:  Cardiogenic Shock Acute on chronic HFrEF and diastolic dysfunction Acute respiratory failure w/ hypoxia Cardiorenal syndrome w/ AKI P: -Admit to ICU for close monitoring -Cardiology following; appreciate recommendations -Will start inotrope (Milrinone) and Lasix; Consider Levo if BP continues to drop -Consider placing A-line and PA catheter -Trend lactate; troponin pending -Continue home digoxin: will check digoxin level due to AKI -Trend CBC, BMP, Mag, UO, daily weights -Placed patient on BiPAP for SOB -Echo pending -CXR am  PE: 03/23/2021 diagnosed at [facility]; sent home on Apixaban for 6 months P: -Will start systemic Heparin later tonight after line placement  Hx of DM2 P: -SSI and CBG monitoring  Hx of HTN, HLD P: -Hold home meds due to hypotension -Start home statin  Hx of MDD and schizophrenia P: -Hold home Risperdal with plans to restart when cardiac function stabilizes  Hx of Hypothyroidism? (Home med synthroid) P: -Will start synthroid -check TSH  Hx of BPH P: -Hold home flomax  Best Practice (right click and "Reselect all SmartList Selections" daily)   Diet/type: NPO Pain/Anxiety/Delirium protocol Not indicated VAP protocol (if indicated): Not indicated DVT prophylaxis: systemic heparin GI prophylaxis: PPI Glucose control:  SSI Central venous access:  N/A Arterial line:  N/A Foley:  N/A Mobility:  bed rest  PT consulted: N/A Studies pending: None Culture data pending:blood Last reviewed culture data:today Antibiotics:not indicated  Antibiotic de-escalation: N/A Stop date: N/A Daily labs: ordered Code Status:  full code Last date of multidisciplinary goals of care discussion [pending] ccm prognosis: Serious Disposition: remains critically ill, will stay in intensive care      Labs   CBC: Recent Labs  Lab 04/29/21 1803 04/29/21 1835  WBC 8.7  --   NEUTROABS 7.2  --   HGB 13.8  15.6  16.0  HCT 43.2 46.0  47.0  MCV 78.0*  --   PLT 291  --     Basic Metabolic Panel: Recent Labs  Lab 04/29/21 1803 04/29/21 1835  NA 132* 132*  132*  K 4.5 4.4  4.4  CL 98 100  CO2 20*  --   GLUCOSE 149* 146*  BUN 27* 28*  CREATININE 1.73* 1.70*  CALCIUM 9.0  --    GFR: Estimated Creatinine Clearance: 55.8 mL/min (A) (by C-G formula based on SCr of 1.7 mg/dL (H)). Recent Labs  Lab 04/29/21 1803  WBC 8.7    Liver Function Tests: Recent Labs  Lab 04/29/21 1803  AST 25  ALT 36  ALKPHOS 162*  BILITOT 1.0  PROT 6.8  ALBUMIN 3.1*   No results for input(s): LIPASE, AMYLASE in the last 168 hours. No results for input(s): AMMONIA in the last 168 hours.  ABG    Component Value Date/Time   PHART 7.457 (H) 01/04/2021 2139   PCO2ART 36.8 01/04/2021 2139   PO2ART 94.6 01/04/2021 2139   HCO3 23.0 04/29/2021 1835   TCO2 24 04/29/2021 1835   TCO2 22 04/29/2021 1835   ACIDBASEDEF 1.0  04/29/2021 1835   O2SAT 97.0 04/29/2021 1835     Coagulation Profile: No results for input(s): INR, PROTIME in the last 168 hours.  Cardiac Enzymes: No results for input(s): CKTOTAL, CKMB, CKMBINDEX, TROPONINI in the last 168 hours.  HbA1C: Hgb A1c MFr Bld  Date/Time Value Ref Range Status  01/05/2021 02:18 AM 7.4 (H) 4.8 - 5.6 % Final    Comment:    (NOTE) Pre diabetes:          5.7%-6.4%  Diabetes:              >6.4%  Glycemic control for   <7.0% adults with diabetes   10/11/2020 05:21 AM 8.1 (H) 4.8 - 5.6 % Final    Comment:    (NOTE) Pre diabetes:          5.7%-6.4%  Diabetes:              >6.4%  Glycemic control for   <7.0% adults with diabetes     CBG: No results for input(s): GLUCAP in the last 168 hours.  Review of Systems:    Gen: Denies fever, chills, weight change (has not been checking daily weights), night sweats HEENT: Denies blurred vision, double vision, hearing loss, tinnitus, sinus congestion, rhinorrhea, sore throat, neck stiffness,  dysphagia PULM: Positive for shortness of breath, cough; negative for sputum production, hemoptysis, wheezing CV: Positive for some mild chest pain with coughing, BLE edema, orthopnea GI: Denies abdominal pain, nausea, vomiting, diarrhea, hematochezia, melena, constipation, change in bowel habits GU: Denies dysuria, hematuria, polyuria, oliguria, urethral discharge Endocrine: Denies hot or cold intolerance, polyuria, polyphagia or appetite change Neuro: Denies headache, numbness, weakness, slurred speech, loss of memory or consciousness   Past Medical History:  He,  has a past medical history of Arthritis, Bipolar 1 disorder ([place]), Cancer ([place]), Cardiomyopathy, nonischemic ([place]), CHF (congestive heart failure) ([place]), COVID (10/2020), Depression, DM2 (diabetes mellitus, type 2) ([place]), HTN (hypertension), and Myocardial infarction ([place]).   Surgical History:   Past Surgical History:  Procedure Laterality Date   BACK SURGERY  01/2020   CARDIAC CATHETERIZATION  10/2020   CHOLECYSTECTOMY     HERNIA REPAIR     INTERCOSTAL NERVE BLOCK Left 12/18/2020   Procedure: INTERCOSTAL NERVE BLOCK;  Surgeon: Melrose Nakayama, MD;  Location: [facility];  Service: Thoracic;  Laterality: Left;   LYMPH NODE BIOPSY Left 12/18/2020   Procedure: LYMPH NODE BIOPSY;  Surgeon: Melrose Nakayama, MD;  Location: [facility];  Service: Thoracic;  Laterality: Left;   RIGHT/LEFT HEART CATH AND CORONARY ANGIOGRAPHY N/A 10/14/2020   Procedure: RIGHT/LEFT HEART CATH AND CORONARY ANGIOGRAPHY;  Surgeon: Jettie Booze, MD;  Location: [facility];  Service: Cardiovascular;  Laterality: N/A;   SPINAL FIXATION SURGERY  02/26/2020   XI ROBOTIC ASSISTED THORASCOPY FOR BIOPSY AP WINDOW LYMPH NODES (Left)  12/18/2020     Social History:   reports that he has been smoking cigarettes. He has been smoking an average of 0.25 packs per day. He has never used smokeless tobacco. He reports previous drug use. Drug: Cocaine. He  reports that he does not drink alcohol.   Family History:  His family history includes Cancer in his mother; Healthy in his sister; Hyperlipidemia in his father and mother; Hypertension in his father and mother; Stroke in his mother.   Allergies Allergies  Allergen Reactions   Ibuprofen Other (See Comments)    Unknown per MAR   Penicillins Rash     Home Medications  Prior to Admission medications   Medication Sig Start Date End Date Taking? Authorizing Provider  acetaminophen (TYLENOL) 325 MG tablet Take 650 mg by mouth every 6 (six) hours as needed for moderate pain.    [provider]  atorvastatin (LIPITOR) 10 MG tablet Take 10 mg by mouth daily. 09/30/20   [provider]  blood glucose meter kit and supplies KIT Dispense based on patient and insurance preference. Use up to four times daily as directed. (FOR ICD-9 250.00, 250.01). 10/23/20   Barb Merino, MD  brimonidine (ALPHAGAN) 0.2 % ophthalmic solution Place 1 drop into both eyes 2 (two) times daily.     [provider]  dapagliflozin propanediol (FARXIGA) 10 MG TABS tablet Take 1 tablet (10 mg total) by mouth daily. 10/23/20   Barb Merino, MD  digoxin (LANOXIN) 0.125 MG tablet Take 1 tablet (125 mcg total) by mouth daily. Patient taking differently: Take 0.125 mg by mouth daily. 12/29/20 12/29/21  Clegg, Amy D, NP  furosemide (LASIX) 80 MG tablet TAKE 1 TABLET (80 MG TOTAL) BY MOUTH TWO TIMES DAILY. Patient taking differently: Take 80 mg by mouth daily. 12/23/20 12/23/21  Gold, Wilder Glade, PA-C  Insulin Pen Needle (PEN NEEDLES 29GX1/2") 29G X 12MM MISC 1 each by Does not apply route daily. 10/23/20   Barb Merino, MD  Insulin Pen Needle 32G X 4 MM MISC USE AS DIRECTED. 10/23/20 10/23/21  Barb Merino, MD  levothyroxine (SYNTHROID) 100 MCG tablet Take 100 mcg by mouth daily before breakfast.    [provider]  melatonin 3 MG TABS tablet Take 3 mg by mouth at bedtime. 09/08/20   [provider]  metoprolol succinate (TOPROL-XL) 25 MG 24 hr tablet TAKE 3 TABLETS (75 MG TOTAL) BY MOUTH DAILY. Patient taking differently: Take 75 mg by mouth daily. 01/16/21 01/16/22  [name], MD  pantoprazole (PROTONIX) 40 MG tablet Take 40 mg by mouth daily before breakfast.    [provider]  polyethylene glycol (MIRALAX / GLYCOLAX) 17 g packet Take 17 g by mouth 2 (two) times daily. 10/23/20   Barb Merino, MD  polyethylene glycol powder (GLYCOLAX/MIRALAX) 17 GM/SCOOP powder TAKE 17 G BY MOUTH 2 (TWO) TIMES DAILY. Patient taking differently: Take 17 g by mouth in the morning and at bedtime. 10/23/20 10/23/21  Barb Merino, MD  potassium chloride SA (KLOR-CON) 20 MEQ tablet TAKE 2 TABLETS (40 MEQ TOTAL) BY MOUTH DAILY. Patient taking differently: Take 40 mEq by mouth daily. 12/23/20 12/23/21  Jadene Pierini E, PA-C  risperiDONE (RISPERDAL) 0.5 MG tablet Take 1 tablet (0.5 mg total) by mouth at bedtime. 12/14/20   Lucrezia Starch, MD  sacubitril-valsartan (ENTRESTO) 24-26 MG TAKE 1 TABLET BY MOUTH TWO TIMES DAILY. 10/23/20 10/23/21  Barb Merino, MD  senna (SENOKOT) 8.6 MG TABS tablet Take 2 tablets (17.2 mg total) by mouth 2 (two) times daily. 10/23/20   Barb Merino, MD  spironolactone (ALDACTONE) 25 MG tablet Take 0.5 tablets (12.5 mg total) by mouth daily. 01/07/21   Madalyn Rob, MD  tamsulosin (FLOMAX) 0.4 MG CAPS capsule Take 0.4 mg by mouth in the morning. 09/08/20   [provider]  traMADol (ULTRAM) 50 MG tablet TAKE 1-2 TABLETS (50-100 MG TOTAL) BY MOUTH EVERY TWELVE HOURS AS NEEDED (MILD PAIN). Patient taking differently: Take 50-100 mg by mouth every 12 (twelve) hours as needed for moderate pain. 12/23/20 06/17/2021   Giovanni, PA-C     Critical care time: 56     JD  Rexene [facility] 04/29/2021, 7:28 PM  Please see [email or web address] for pager details.  From 7A-7P if no response, please call [phone]. After hours, please  call ELink [phone].

## 2021-04-29 NOTE — ED Triage Notes (Signed)
Pt was bib ems from [facility] [employer] c/o SOB since the weekend. Pt has been coughing which he denies mucous. Pt was transported with non-rebreather mask.  Pt states he uses a CPAP machine but no relief. BP 124/60 HR 104

## 2021-04-29 NOTE — ED Notes (Signed)
Pt BP trending down 106/54 (62) to 70/32 (41). Provider at the bedside.

## 2021-04-29 NOTE — ED Notes (Signed)
Provider at the bedside with IV ultrasound.

## 2021-04-29 NOTE — ED Notes (Signed)
Pt stated he would like an inhaler so I notified provider.

## 2021-04-29 NOTE — ED Provider Notes (Signed)
[facility] EMERGENCY DEPARTMENT Provider Note   CSN: [id number] Arrival date & time: 04/29/21  1650     History Chief Complaint  Patient presents with   Shortness of Breath    Charles [name] is a 55 y.o. male.  Presents to ER with concern for shortness of breath.  Patient has a history of schizophrenia/bipolar, nonischemic cardiomyopathy.  Recent admission for heart failure, pulmonary embolism.  Has been having worsening shortness of breath over the past few days.  Has been having worsening cough recently.  Came to ER on 6/18.  Chief complaint of time was cough, running out of oxygen long-term rehab.  Provided Robitussin and discharged.  Difficulty breathing has been steadily worsening.  Has slight chest tightness at present.  HPI     Past Medical History:  Diagnosis Date   Arthritis    lower back   Bipolar 1 disorder ([place])    Cancer ([place])    Cardiomyopathy, nonischemic ([place])    no CAD 10/14/20 cath, EF 20-25% by 10/11/20 echo   CHF (congestive heart failure) ([place])    COVID 10/2020   Depression    DM2 (diabetes mellitus, type 2) ([place])    HTN (hypertension)    Myocardial infarction [facility])     Patient Active Problem List   Diagnosis Date Noted   Cardiogenic shock ([place]) 04/29/2021   Cardiorenal syndrome with renal failure 04/29/2021   Homelessness 03/21/2021   Suicidal ideations 03/20/2021   MDD (major depressive disorder), recurrent, severe, with psychosis ([place]) 02/28/2021   Acute hypoxemic respiratory failure ([place]) 01/13/2021   Acute on chronic heart failure with reduced ejection fraction and diastolic dysfunction ([place]) 01/12/2021   Schizophrenia ([place]) 01/04/2021   Diarrhea 01/04/2021   Pulmonary edema    Mediastinal adenopathy 12/18/2020   Adjustment disorder with mixed anxiety and depressed mood 10/30/2020   Mediastinal lymphadenopathy 10/11/2020   Shortness of breath 10/11/2020   Sinus tachycardia 10/11/2020   Night sweats 10/11/2020   DM2  (diabetes mellitus, type 2) ([place]) 10/11/2020   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/11/2020   Hypertension 09/25/2020   BPH (benign prostatic hyperplasia) 09/25/2020   Insomnia 09/25/2020    Past Surgical History:  Procedure Laterality Date   BACK SURGERY  01/2020   CARDIAC CATHETERIZATION  10/2020   CHOLECYSTECTOMY     HERNIA REPAIR     INTERCOSTAL NERVE BLOCK Left 12/18/2020   Procedure: INTERCOSTAL NERVE BLOCK;  Surgeon: Melrose Nakayama, MD;  Location: [facility];  Service: Thoracic;  Laterality: Left;   LYMPH NODE BIOPSY Left 12/18/2020   Procedure: LYMPH NODE BIOPSY;  Surgeon: Melrose Nakayama, MD;  Location: [facility] OR;  Service: Thoracic;  Laterality: Left;   RIGHT/LEFT HEART CATH AND CORONARY ANGIOGRAPHY N/A 10/14/2020   Procedure: RIGHT/LEFT HEART CATH AND CORONARY ANGIOGRAPHY;  Surgeon: Jettie Booze, MD;  Location: [facility];  Service: Cardiovascular;  Laterality: N/A;   SPINAL FIXATION SURGERY  02/26/2020   XI ROBOTIC ASSISTED THORASCOPY FOR BIOPSY AP WINDOW LYMPH NODES (Left)  12/18/2020       Family History  Problem Relation Age of Onset   Hypertension Mother    Hyperlipidemia Mother    Stroke Mother    Cancer Mother    Hypertension Father    Hyperlipidemia Father    Healthy Sister     Social History   Tobacco Use   Smoking status: Some Days    Packs/day: 0.25    Pack years:  0.00    Types: Cigarettes   Smokeless tobacco: Never   Tobacco comments:    occasionally  Vaping Use   Vaping Use: Never used  Substance Use Topics   Alcohol use: Never   Drug use: Not Currently    Types: Cocaine    Home Medications Prior to Admission medications   Medication Sig Start Date End Date Taking? Authorizing Provider  acetaminophen (TYLENOL) 325 MG tablet Take 650 mg by mouth every 6 (six) hours as needed for moderate pain.   Yes [provider]  albuterol (PROVENTIL) (2.5 MG/3ML) 0.083%  nebulizer solution Take 2.5 mg by nebulization in the morning and at bedtime.   Yes [provider]  albuterol (VENTOLIN HFA) 108 (90 Base) MCG/ACT inhaler Inhale 2 puffs into the lungs 4 (four) times daily as needed for wheezing or shortness of breath.   Yes [provider]  apixaban (ELIQUIS) 5 MG TABS tablet Take 5 mg by mouth 2 (two) times daily.   Yes [provider]  atorvastatin (LIPITOR) 10 MG tablet Take 10 mg by mouth daily. 09/30/20  Yes [provider]  dapagliflozin propanediol (FARXIGA) 10 MG TABS tablet Take 1 tablet (10 mg total) by mouth daily. 10/23/20  Yes Barb Merino, MD  furosemide (LASIX) 80 MG tablet Take 80 mg by mouth daily.   Yes [provider]  hydrOXYzine (VISTARIL) 25 MG capsule Take 25 mg by mouth See admin instructions. Tid as needed for anxiety for up to 10 days   Yes [provider]  levothyroxine (SYNTHROID) 100 MCG tablet Take 100 mcg by mouth daily before breakfast.   Yes [provider]  metoprolol succinate (TOPROL-XL) 25 MG 24 hr tablet TAKE 3 TABLETS (75 MG TOTAL) BY MOUTH DAILY. Patient taking differently: Take 25 mg by mouth daily. 01/16/21 01/16/22 Yes [name], MD  pantoprazole (PROTONIX) 40 MG tablet Take 40 mg by mouth daily before breakfast.   Yes [provider]  Polyethylene Glycol 3350 (PEG 3350) 17 g PACK Take 1 packet by mouth in the morning and at bedtime.   Yes [provider]  risperiDONE (RISPERDAL) 0.5 MG tablet Take 1 tablet (0.5 mg total) by mouth at bedtime. 12/14/20  Yes Kelsea Mousel, Ellwood Dense, MD  sacubitril-valsartan (ENTRESTO) 24-26 MG TAKE 1 TABLET BY MOUTH TWO TIMES DAILY. Patient taking differently: Take 1 tablet by mouth in the morning and at bedtime. 10/23/20 10/23/21 Yes Barb Merino, MD  senna (SENOKOT) 8.6 MG TABS tablet Take 2 tablets (17.2 mg total) by mouth 2 (two) times daily. Patient taking differently: Take 2 tablets by mouth 2 (two) times  daily as needed for mild constipation. 10/23/20  Yes Barb Merino, MD  spironolactone (ALDACTONE) 25 MG tablet Take 0.5 tablets (12.5 mg total) by mouth daily. 01/07/21  Yes Madalyn Rob, MD  tamsulosin (FLOMAX) 0.4 MG CAPS capsule Take 0.4 mg by mouth in the morning. 09/08/20  Yes [provider]  blood glucose meter kit and supplies KIT Dispense based on patient and insurance preference. Use up to four times daily as directed. (FOR ICD-9 250.00, 250.01). 10/23/20   Barb Merino, MD  brimonidine (ALPHAGAN) 0.2 % ophthalmic solution Place 1 drop into both eyes 2 (two) times daily.  Patient not taking: Reported on 04/29/2021    [provider]  digoxin (LANOXIN) 0.125 MG tablet Take 1 tablet (125 mcg total) by mouth daily. Patient not taking: Reported on 04/29/2021 12/29/20 12/29/21  Darrick Grinder D, NP  furosemide (LASIX) 80 MG  tablet TAKE 1 TABLET (80 MG TOTAL) BY MOUTH TWO TIMES DAILY. Patient not taking: No sig reported 12/23/20 12/23/21  Jadene Pierini E, PA-C  Insulin Pen Needle (PEN NEEDLES 29GX1/2") 29G X 12MM MISC 1 each by Does not apply route daily. Patient not taking: Reported on 04/29/2021 10/23/20   Barb Merino, MD  Insulin Pen Needle 32G X 4 MM MISC USE AS DIRECTED. Patient not taking: Reported on 04/29/2021 10/23/20 10/23/21  Barb Merino, MD  melatonin 3 MG TABS tablet Take 3 mg by mouth at bedtime. Patient not taking: Reported on 04/29/2021 09/08/20   [provider]  polyethylene glycol (MIRALAX / GLYCOLAX) 17 g packet Take 17 g by mouth 2 (two) times daily. Patient not taking: Reported on 04/29/2021 10/23/20   Barb Merino, MD  polyethylene glycol powder (GLYCOLAX/MIRALAX) 17 GM/SCOOP powder TAKE 17 G BY MOUTH 2 (TWO) TIMES DAILY. Patient not taking: Reported on 04/29/2021 10/23/20 10/23/21  Barb Merino, MD  potassium chloride SA (KLOR-CON) 20 MEQ tablet TAKE 2 TABLETS (40 MEQ TOTAL) BY MOUTH DAILY. Patient not taking: Reported on 04/29/2021 12/23/20 12/23/21   Jadene Pierini E, PA-C  traMADol (ULTRAM) 50 MG tablet TAKE 1-2 TABLETS (50-100 MG TOTAL) BY MOUTH EVERY TWELVE HOURS AS NEEDED (MILD PAIN). Patient not taking: Reported on 04/29/2021 12/23/20 07/04/2021  John Giovanni, PA-C    Allergies    Ibuprofen and Penicillins  Review of Systems   Review of Systems  Constitutional:  Negative for chills and fever.  HENT:  Negative for ear pain and sore throat.   Eyes:  Negative for pain and visual disturbance.  Respiratory:  Positive for shortness of breath. Negative for cough.   Cardiovascular:  Positive for chest pain. Negative for palpitations.  Gastrointestinal:  Negative for abdominal pain and vomiting.  Genitourinary:  Negative for dysuria and hematuria.  Musculoskeletal:  Negative for arthralgias and back pain.  Skin:  Negative for color change and rash.  Neurological:  Negative for seizures and syncope.  All other systems reviewed and are negative.  Physical Exam Updated Vital Signs BP (!) 90/52   Pulse (!) 108   Temp (!) 96.7 F (35.9 C) (Axillary)   Resp (!) 34   Ht _0  (1.753 m)   Wt 106.9 kg   SpO2 95%   BMI 34.80 kg/m   Physical Exam Vitals and nursing note reviewed.  Constitutional:      Appearance: He is well-developed.     Comments: Tachypnea, somewhat increased work of breathing  HENT:     Head: Normocephalic and atraumatic.  Eyes:     Conjunctiva/sclera: Conjunctivae normal.  Cardiovascular:     Rate and Rhythm: Normal rate and regular rhythm.     Heart sounds: No murmur heard. Pulmonary:     Comments: Crackles noted bilaterally, somewhat tachypneic, still speaks in full sentences Abdominal:     Palpations: Abdomen is soft.     Tenderness: There is no abdominal tenderness.  Musculoskeletal:     Cervical back: Neck supple.  Skin:    General: Skin is warm and dry.  Neurological:     Mental Status: He is alert.    ED Results / Procedures / Treatments   Labs (all labs ordered are listed, but only abnormal  results are displayed) Labs Reviewed  LACTIC ACID, PLASMA - Abnormal; Notable for the following components:      Result Value   Lactic Acid, Venous 2.6 (*)    All other components within normal limits  CBC  WITH DIFFERENTIAL/PLATELET - Abnormal; Notable for the following components:   MCV 78.0 (*)    MCH 24.9 (*)    RDW 18.2 (*)    nRBC 0.3 (*)    All other components within normal limits  BRAIN NATRIURETIC PEPTIDE - Abnormal; Notable for the following components:   B Natriuretic Peptide 1,156.5 (*)    All other components within normal limits  COMPREHENSIVE METABOLIC PANEL - Abnormal; Notable for the following components:   Sodium 132 (*)    CO2 20 (*)    Glucose, Bld 149 (*)    BUN 27 (*)    Creatinine, Ser 1.73 (*)    Albumin 3.1 (*)    Alkaline Phosphatase 162 (*)    GFR, Estimated 45 (*)    All other components within normal limits  HEMOGLOBIN A1C - Abnormal; Notable for the following components:   Hgb A1c MFr Bld 7.3 (*)    All other components within normal limits  GLUCOSE, CAPILLARY - Abnormal; Notable for the following components:   Glucose-Capillary 124 (*)    All other components within normal limits  I-STAT VENOUS BLOOD GAS, ED - Abnormal; Notable for the following components:   pCO2, Ven 37.1 (*)    pO2, Ven 95.0 (*)    Sodium 132 (*)    Calcium, Ion 1.11 (*)    All other components within normal limits  I-STAT CHEM 8, ED - Abnormal; Notable for the following components:   Sodium 132 (*)    BUN 28 (*)    Creatinine, Ser 1.70 (*)    Glucose, Bld 146 (*)    Calcium, Ion 1.09 (*)    All other components within normal limits  CBG MONITORING, ED - Abnormal; Notable for the following components:   Glucose-Capillary 133 (*)    All other components within normal limits  TROPONIN I (HIGH SENSITIVITY) - Abnormal; Notable for the following components:   Troponin I (High Sensitivity) 18 (*)    All other components within normal limits  RESP PANEL BY RT-PCR (FLU A&B,  COVID) ARPGX2  CULTURE, BLOOD (ROUTINE X 2)  CULTURE, BLOOD (ROUTINE X 2)  MRSA NEXT GEN BY PCR, NASAL  LACTIC ACID, PLASMA  TSH  DIGOXIN LEVEL  BASIC METABOLIC PANEL  COOXEMETRY PANEL  CBC  MAGNESIUM  PHOSPHORUS    EKG EKG Interpretation  Date/Time:  Wednesday April 29 2021 16:51:23 EDT Ventricular Rate:  110 PR Interval:  182 QRS Duration: 113 QT Interval:  388 QTC Calculation: 525 R Axis:   265 Text Interpretation: Sinus tachycardia Left atrial enlargement Left anterior fascicular block Anteroseptal infarct, old Nonspecific T abnormalities, lateral leads Prolonged QT interval Confirmed by Madalyn Rob [phone]) on 04/29/2021 5:16:07 PM  Radiology DG Chest Portable 1 View  Result Date: 04/29/2021 CLINICAL DATA:  Short of breath, cough.  History of lymphoma EXAM: PORTABLE CHEST 1 VIEW COMPARISON:  Radiograph 04/25/2021 FINDINGS: Cardiac silhouette is enlarged similar prior. There is perihilar densities suggesting lymphadenopathy. There is superimposed central airspace disease which is slightly worse than the prior. IMPRESSION: 1. Mediastinal lymphadenopathy. 2. Increased central airspace disease suggest pulmonary edema. 3. Cardiomegaly. Electronically Signed   By: Suzy Bouchard M.D.   On: 04/29/2021 18:08    Procedures .Critical Care  Date/Time: 04/29/2021 11:41 PM Performed by: Lucrezia Starch, MD Authorized by: Lucrezia Starch, MD   Critical care provider statement:    Critical care time (minutes):  49   Critical care was time spent personally by me on the  following activities:  Discussions with consultants, evaluation of patient's response to treatment, examination of patient, ordering and performing treatments and interventions, ordering and review of laboratory studies, ordering and review of radiographic studies, pulse oximetry, re-evaluation of patient's condition, obtaining history from patient or surrogate and review of old charts Ultrasound ED Peripheral IV  (Provider)  Date/Time: 04/29/2021 11:41 PM Performed by: Lucrezia Starch, MD Authorized by: Lucrezia Starch, MD   Procedure details:    Indications: hydration and hypotension     Location:  Right forearm   Angiocath:  20 G   Bedside Ultrasound Guided: Yes     Patient tolerated procedure without complications: Yes     Dressing applied: Yes   Ultrasound ED Echo  Date/Time: 04/29/2021 11:44 PM Performed by: Lucrezia Starch, MD Authorized by: Lucrezia Starch, MD   Procedure details:    Indications: hypotension and dyspnea     Views: parasternal long axis view, parasternal short axis view and apical 4 chamber view     Images: archived     Limitations:  Acoustic shadowing and body habitus Findings:    Pericardium: no pericardial effusion     Cardiac Activity comment:  Severely depressed   LV Function: severly depressed (<30%)     RV Diameter: normal     Other signs of RV strain comment:  None Impression:    Impression: decreased contractility     Medications Ordered in ED Medications  milrinone (PRIMACOR) 20 MG/100 ML (0.2 mg/mL) infusion (0.25 mcg/kg/min  99.8 kg Intravenous New Bag/Given 04/29/21 2245)  docusate sodium (COLACE) capsule 100 mg (has no administration in time range)  polyethylene glycol (MIRALAX / GLYCOLAX) packet 17 g (has no administration in time range)  pantoprazole (PROTONIX) injection 40 mg (40 mg Intravenous Given 04/29/21 2248)  atorvastatin (LIPITOR) tablet 10 mg (has no administration in time range)  digoxin (LANOXIN) tablet 125 mcg (has no administration in time range)  levothyroxine (SYNTHROID) tablet 100 mcg (has no administration in time range)  insulin aspart (novoLOG) injection 0-15 Units (2 Units Subcutaneous Given 04/29/21 2324)  levalbuterol (XOPENEX) nebulizer solution 0.63 mg (0.63 mg Nebulization Given 04/29/21 2318)  albuterol (VENTOLIN HFA) 108 (90 Base) MCG/ACT inhaler 2 puff (2 puffs Inhalation Given 04/29/21 1837)  furosemide  (LASIX) injection 60 mg (60 mg Intravenous Given 04/29/21 2046)    ED Course  I have reviewed the triage vital signs and the nursing notes.  Pertinent labs & imaging results that were available during my care of the patient were reviewed by me and considered in my medical decision making (see chart for details).  Clinical Course as of 04/29/21 2344  Wed Apr 29, 2021  1912 D/w Hilty - he recommends attempt at diuresis, if he has worsening BP then recommends inotropic support with levophed [RD]  1912 Will discuss with critical care [RD]    Clinical Course User Index [RD] Lucrezia Starch, MD   MDM Rules/Calculators/A&P                          55 year old presented to ER with concern for shortness of breath.  Has a history of severe heart failure, nonischemic cardiomyopathy as well as pulmonary embolism.  On exam patient noted to be significantly tachypneic, increased O2 requirement on supplemental nasal cannula, BP soft.  Reviewed notes at bedside, has been taking anticoagulation, lower suspicion for new PE.  Chest x-ray with increased airspace disease concerning for pulmonary edema, elevated BNP.  Brief bedside echo with profoundly reduced EF, no obvious signs of RV strain.  Principally concern for heart failure exacerbation.  Discussed case with Dr. Debara Pickett on-call for cardiology who came and evaluated patient.  Recommended aggressive IV diuresis, inotropic support if needed.  At time of admit, MAP borderline but okay and mental status good, does not need pressors now but certainly is at high risk of needing pressor support. Consulted critical care to admit as primary.  Critical care evaluated patient, started BiPAP and admitted to ICU.  Dorothy accepting.  Final Clinical Impression(s) / ED Diagnoses Final diagnoses:  Acute systolic heart failure ([place])  Cardiogenic shock ([place])    Rx / DC Orders ED Discharge Orders     None        Lucrezia Starch, MD 04/29/21 2345

## 2021-04-30 ENCOUNTER — Encounter (HOSPITAL_COMMUNITY): Payer: Self-pay | Admitting: Pulmonary Disease

## 2021-04-30 ENCOUNTER — Inpatient Hospital Stay: Payer: Self-pay

## 2021-04-30 ENCOUNTER — Inpatient Hospital Stay (HOSPITAL_COMMUNITY): Payer: Medicaid Other

## 2021-04-30 DIAGNOSIS — I5022 Chronic systolic (congestive) heart failure: Secondary | ICD-10-CM

## 2021-04-30 DIAGNOSIS — I5084 End stage heart failure: Secondary | ICD-10-CM

## 2021-04-30 DIAGNOSIS — R570 Cardiogenic shock: Secondary | ICD-10-CM

## 2021-04-30 LAB — ECHOCARDIOGRAM COMPLETE
AR max vel: 1.82 cm2
AV Area VTI: 1.76 cm2
AV Area mean vel: 1.69 cm2
AV Mean grad: 3 mmHg
AV Peak grad: 4.4 mmHg
Ao pk vel: 1.05 m/s
Height: 69 in
S' Lateral: 5.9 cm
Weight: 3770.75 oz

## 2021-04-30 LAB — CBC
HCT: 42.3 % (ref 39.0–52.0)
Hemoglobin: 14.1 g/dL (ref 13.0–17.0)
MCH: 25.1 pg — ABNORMAL LOW (ref 26.0–34.0)
MCHC: 33.3 g/dL (ref 30.0–36.0)
MCV: 75.4 fL — ABNORMAL LOW (ref 80.0–100.0)
Platelets: 191 10*3/uL (ref 150–400)
RBC: 5.61 MIL/uL (ref 4.22–5.81)
RDW: 18.3 % — ABNORMAL HIGH (ref 11.5–15.5)
WBC: 6.4 10*3/uL (ref 4.0–10.5)
nRBC: 0.3 % — ABNORMAL HIGH (ref 0.0–0.2)

## 2021-04-30 LAB — COOXEMETRY PANEL
Carboxyhemoglobin: 1.2 % (ref 0.5–1.5)
Methemoglobin: 0.8 % (ref 0.0–1.5)
O2 Saturation: 73.2 %
Total hemoglobin: 13.2 g/dL (ref 12.0–16.0)

## 2021-04-30 LAB — PHOSPHORUS: Phosphorus: 4.9 mg/dL — ABNORMAL HIGH (ref 2.5–4.6)

## 2021-04-30 LAB — MRSA NEXT GEN BY PCR, NASAL: MRSA by PCR Next Gen: DETECTED — AB

## 2021-04-30 LAB — BASIC METABOLIC PANEL
Anion gap: 11 (ref 5–15)
BUN: 25 mg/dL — ABNORMAL HIGH (ref 6–20)
CO2: 24 mmol/L (ref 22–32)
Calcium: 9.1 mg/dL (ref 8.9–10.3)
Chloride: 99 mmol/L (ref 98–111)
Creatinine, Ser: 1.46 mg/dL — ABNORMAL HIGH (ref 0.61–1.24)
GFR, Estimated: 56 mL/min — ABNORMAL LOW (ref >60)
Glucose, Bld: 89 mg/dL (ref 70–99)
Potassium: 4.1 mmol/L (ref 3.5–5.1)
Sodium: 134 mmol/L — ABNORMAL LOW (ref 135–145)

## 2021-04-30 LAB — GLUCOSE, CAPILLARY
Glucose-Capillary: 95 mg/dL (ref 70–99)
Glucose-Capillary: 129 mg/dL — ABNORMAL HIGH (ref 70–99)
Glucose-Capillary: 118 mg/dL — ABNORMAL HIGH (ref 70–99)
Glucose-Capillary: 147 mg/dL — ABNORMAL HIGH (ref 70–99)
Glucose-Capillary: 139 mg/dL — ABNORMAL HIGH (ref 70–99)
Glucose-Capillary: 150 mg/dL — ABNORMAL HIGH (ref 70–99)

## 2021-04-30 LAB — TROPONIN I (HIGH SENSITIVITY): Troponin I (High Sensitivity): 17 ng/L (ref <18)

## 2021-04-30 LAB — DIGOXIN LEVEL: Digoxin Level: <0.2 ng/mL — ABNORMAL LOW (ref 0.8–2.0)

## 2021-04-30 LAB — MAGNESIUM: Magnesium: 2.2 mg/dL (ref 1.7–2.4)

## 2021-04-30 MED ORDER — MUPIROCIN 2 % EX OINT
1.0000 "application " | TOPICAL_OINTMENT | Freq: Two times a day (BID) | CUTANEOUS | Status: DC
  Administered 2021-04-30 – 2021-05-03 (×7): 1 via NASAL
  Filled 2021-04-30 (×2): qty 22

## 2021-04-30 MED ORDER — GUAIFENESIN-DM 100-10 MG/5ML PO SYRP
10.0000 mL | ORAL_SOLUTION | ORAL | Status: DC | PRN
  Administered 2021-04-30 – 2021-05-04 (×18): 10 mL via ORAL
  Filled 2021-04-30 (×20): qty 10

## 2021-04-30 MED ORDER — CHLORHEXIDINE GLUCONATE CLOTH 2 % EX PADS
6.0000 | MEDICATED_PAD | Freq: Every day | CUTANEOUS | Status: AC
  Administered 2021-04-30 – 2021-05-03 (×5): 6 via TOPICAL

## 2021-04-30 MED ORDER — SODIUM CHLORIDE 0.9% FLUSH: 10.0000 mL | INTRAVENOUS | Status: DC | PRN

## 2021-04-30 MED ORDER — CHLORHEXIDINE GLUCONATE CLOTH 2 % EX PADS
6.0000 | MEDICATED_PAD | Freq: Every day | CUTANEOUS | Status: DC
  Administered 2021-04-30 – 2021-05-03 (×4): 6 via TOPICAL

## 2021-04-30 MED ORDER — NOREPINEPHRINE 4 MG/250ML-% IV SOLN
2.0000 ug/min | INTRAVENOUS | Status: DC
  Administered 2021-04-30: 2 ug/min via INTRAVENOUS
  Filled 2021-04-30: qty 250

## 2021-04-30 MED ORDER — RISPERIDONE 0.5 MG PO TABS
0.5000 mg | ORAL_TABLET | Freq: Every day | ORAL | Status: DC
  Administered 2021-04-30 – 2021-05-03 (×4): 0.5 mg via ORAL
  Filled 2021-04-30 (×4): qty 1

## 2021-04-30 MED ORDER — FUROSEMIDE 10 MG/ML IJ SOLN
80.0000 mg | Freq: Two times a day (BID) | INTRAMUSCULAR | Status: DC
  Administered 2021-04-30 – 2021-05-01 (×3): 80 mg via INTRAVENOUS
  Filled 2021-04-30 (×3): qty 8

## 2021-04-30 MED ORDER — SODIUM CHLORIDE 0.9% FLUSH
10.0000 mL | Freq: Two times a day (BID) | INTRAVENOUS | Status: DC
  Administered 2021-04-30: 20 mL
  Administered 2021-04-30 – 2021-05-04 (×6): 10 mL

## 2021-04-30 MED ORDER — APIXABAN 5 MG PO TABS
5.0000 mg | ORAL_TABLET | Freq: Two times a day (BID) | ORAL | Status: DC
  Administered 2021-04-30 – 2021-05-04 (×9): 5 mg via ORAL
  Filled 2021-04-30 (×9): qty 1

## 2021-04-30 MED ORDER — PANTOPRAZOLE SODIUM 40 MG PO TBEC
40.0000 mg | DELAYED_RELEASE_TABLET | Freq: Every day | ORAL | Status: DC
  Administered 2021-04-30 – 2021-05-04 (×5): 40 mg via ORAL
  Filled 2021-04-30 (×5): qty 1

## 2021-04-30 MED ORDER — PERFLUTREN LIPID MICROSPHERE
1.0000 mL | INTRAVENOUS | Status: AC | PRN
  Administered 2021-04-30: 2 mL via INTRAVENOUS
  Filled 2021-04-30: qty 10

## 2021-04-30 MED ORDER — SODIUM CHLORIDE 0.9 % IV SOLN: 250.0000 mL | INTRAVENOUS | Status: DC

## 2021-04-30 MED ORDER — PERFLUTREN LIPID MICROSPHERE
1.0000 mL | INTRAVENOUS | Status: AC | PRN
  Filled 2021-04-30: qty 10

## 2021-04-30 MED ORDER — NOREPINEPHRINE 16 MG/250ML-% IV SOLN
0.0000 ug/min | INTRAVENOUS | Status: DC
  Administered 2021-04-30: 8 ug/min via INTRAVENOUS
  Filled 2021-04-30: qty 250

## 2021-04-30 MED ORDER — MILRINONE LACTATE IN DEXTROSE 20-5 MG/100ML-% IV SOLN
0.2500 ug/kg/min | INTRAVENOUS | Status: DC
  Administered 2021-04-30: 0.5 ug/kg/min via INTRAVENOUS
  Administered 2021-04-30: 0.75 ug/kg/min via INTRAVENOUS
  Administered 2021-04-30 – 2021-05-01 (×4): 0.5 ug/kg/min via INTRAVENOUS
  Administered 2021-05-01 – 2021-05-04 (×5): 0.25 ug/kg/min via INTRAVENOUS
  Filled 2021-04-30 (×12): qty 100

## 2021-04-30 NOTE — Progress Notes (Signed)
Pt has PRN Bipap , no distress noted at this time.  

## 2021-04-30 NOTE — Progress Notes (Signed)
[facility] Progress Note Patient Name: Charles [name] DOB: [birth year] MRN: [id number]   Date of Service  04/30/2021  HPI/Events of Note  Nursing requesting cough medication. Now NPO. However, patient has been tolerating sips of water without problems.   eICU Interventions  Plan: NPO except sips with meds. Robitussin D< 10 mL PO Q 4 hours PRN cough.     Intervention Category Major Interventions: Other:  Lysle Dingwall 04/30/2021, 5:59 AM

## 2021-04-30 NOTE — Progress Notes (Signed)
[facility] PCCM  Worsening shock Add levophed Place PICC Check SvO2  Roselie Awkward, MD [place] [facility] Pager: [phone] Cell: [phone] If no response, please call [phone] until 7pm After 7:00 pm call Elink  [phone]

## 2021-04-30 NOTE — Progress Notes (Addendum)
Attending:    Subjective: 55 y/o male with a complex medical history including lymphoma, non-ischemic cardiomyopathy and recent PE treated with eliquis.  He presented yesterday with acute on chronic respiratory failure felt to be due to a CHF exacerbation.  Cardiology asked PCCM to admit.  He was placed on milrinone, given lasix.  He required bipap overnight.   Objective: Vitals:   04/30/21 0732 04/30/21 0745 04/30/21 0800 04/30/21 0815  BP:  (!) 88/69 (!) 96/56 (!) 88/61  Pulse:  (!) 104 (!) 108 (!) 101  Resp:  (!) 43 (!) 43 (!) 33  Temp: (!) 96.6 F (35.9 C)     TempSrc: Axillary     SpO2:  92% 93% 94%  Weight:      Height:       FiO2 (%):  [30 %] 30 %  Intake/Output Summary (Last 24 hours) at 04/30/2021 0850 Last data filed at 04/30/2021 0825 Gross per 24 hour  Intake 162.71 ml  Output 650 ml  Net -487.29 ml    General:  Resting comfortably in bed HENT: NCAT OP clear PULM: Few crackles bilaterally, normal effort CV: RRR, no mgr GI: BS+, soft, nontender MSK: normal bulk and tone Neuro: awake, alert, no distress, MAEW   CBC    Component Value Date/Time   WBC 6.4 04/30/2021 0451   RBC 5.61 04/30/2021 0451   HGB 14.1 04/30/2021 0451   HGB 15.3 01/22/2021 1141   HCT 42.3 04/30/2021 0451   HCT 47.6 01/22/2021 1141   PLT 191 04/30/2021 0451   PLT 185 01/22/2021 1141   MCV 75.4 (L) 04/30/2021 0451   MCV 80 01/22/2021 1141   MCH 25.1 (L) 04/30/2021 0451   MCHC 33.3 04/30/2021 0451   RDW 18.3 (H) 04/30/2021 0451   RDW 17.6 (H) 01/22/2021 1141   LYMPHSABS 0.8 04/29/2021 1803   MONOABS 0.5 04/29/2021 1803   EOSABS 0.2 04/29/2021 1803   BASOSABS 0.1 04/29/2021 1803    BMET    Component Value Date/Time   NA 134 (L) 04/30/2021 0451   NA 140 01/22/2021 1141   K 4.1 04/30/2021 0451   CL 99 04/30/2021 0451   CO2 24 04/30/2021 0451   GLUCOSE 89 04/30/2021 0451   BUN 25 (H) 04/30/2021 0451   BUN 13 01/22/2021 1141   CREATININE 1.46 (H) 04/30/2021 0451    CREATININE 1.22 01/01/2021 1308   CALCIUM 9.1 04/30/2021 0451   GFRNONAA 56 (L) 04/30/2021 0451   GFRNONAA >60 01/01/2021 1308   GFRAA 70 (L) 11/09/2011 1445    CXR images reviewed, edema bilaterally  Impression/Plan: Acute respiratory failure with hypoxemia due to acute pulmonary edema from acute decompensated systolic heart failure and cardiogenic shock: due to medication non-compliance? Suspect he has significant dietary indiscretion Decrease milrinone Defer decision on restarting b-blocker and entresto to cardiology Continue lasix 80mg  bid today Transition from bipap to oxygen, 2L [place]  Recent PE Continue eliquis  Cough: suspect related to GERD/diet PPI  Remain in ICU  Rest per NP note   My cc time 30 minutes  Roselie Awkward, MD [facility] [facility]M Pager: [phone] Cell: [phone] After 3pm or if no response, call [phone]

## 2021-04-30 NOTE — Plan of Care (Signed)
  Problem: Education: Goal: Knowledge of General Education information will improve Description Including pain rating scale, medication(s)/side effects and non-pharmacologic comfort measures Outcome: Progressing   Problem: Health Behavior/Discharge Planning: Goal: Ability to manage health-related needs will improve Outcome: Progressing   Problem: Clinical Measurements: Goal: Ability to maintain clinical measurements within normal limits will improve Outcome: Progressing Goal: Will remain free from infection Outcome: Progressing Goal: Diagnostic test results will improve Outcome: Progressing Goal: Respiratory complications will improve Outcome: Progressing Goal: Cardiovascular complication will be avoided Outcome: Progressing   Problem: Activity: Goal: Risk for activity intolerance will decrease Outcome: Progressing   Problem: Nutrition: Goal: Adequate nutrition will be maintained Outcome: Progressing   Problem: Coping: Goal: Level of anxiety will decrease Outcome: Progressing   Problem: Elimination: Goal: Will not experience complications related to bowel motility Outcome: Progressing Goal: Will not experience complications related to urinary retention Outcome: Progressing   Problem: Pain Managment: Goal: General experience of comfort will improve Outcome: Progressing   Problem: Safety: Goal: Ability to remain free from injury will improve Outcome: Progressing   Problem: Skin Integrity: Goal: Risk for impaired skin integrity will decrease Outcome: Progressing   Problem: Activity: Goal: Capacity to carry out activities will improve Outcome: Progressing   Problem: Cardiac: Goal: Ability to achieve and maintain adequate cardiopulmonary perfusion will improve Outcome: Progressing   

## 2021-04-30 NOTE — Progress Notes (Signed)
Patient went into 20 beat VT and levo gtt was turned off. CV MD came around the corner and is talking palliative are to patient currently. Will continue to monitor. Bartholomew Crews, RN 04/30/2021 6:16 PM

## 2021-04-30 NOTE — Progress Notes (Signed)
Peripherally Inserted Central Catheter Placement  The IV Nurse has discussed with the patient and/or persons authorized to consent for the patient, the purpose of this procedure and the potential benefits and risks involved with this procedure.  The benefits include less needle sticks, lab draws from the catheter, and the patient may be discharged home with the catheter. Risks include, but not limited to, infection, bleeding, blood clot (thrombus formation), and puncture of an artery; nerve damage and irregular heartbeat and possibility to perform a PICC exchange if needed/ordered by physician.  Alternatives to this procedure were also discussed.  Bard Power PICC patient education guide, fact sheet on infection prevention and patient information card has been provided to patient /or left at bedside.    PICC Placement Documentation  PICC Double Lumen 29/56/21 PICC Left Basilic 47 cm 1 cm (Active)  Indication for Insertion or Continuance of Line Vasoactive infusions 04/30/21 1228  Exposed Catheter (cm) 1 cm 04/30/21 1228  Site Assessment Clean;Dry;Intact 04/30/21 1228  Lumen #1 Status Flushed;Blood return noted 04/30/21 1228  Lumen #2 Status Flushed;Blood return noted 04/30/21 1228  Dressing Type Transparent 04/30/21 1228  Dressing Status Clean;Dry;Intact 04/30/21 1228  Antimicrobial disc in place? Yes 04/30/21 1228  Safety Lock Not Applicable 30/86/57 8469  Dressing Intervention New dressing;Other (Comment) 04/30/21 1228  Dressing Change Due 05/07/21 04/30/21 1228       Charles [name] 04/30/2021, 12:29 PM

## 2021-04-30 NOTE — Consult Note (Addendum)
Advanced Heart Failure Team Consult Note   Primary Physician: [name], MD PCP-Cardiologist:  [name], MD Oncology: Dr Dr Lorenso Courier and Dr Isidore Moos  Reason for Consultation: A/C Systolic Heart Failure   HPI:    Charles [name] is seen today for evaluation of A/C Systolic Heart Failure at the request of Dr Lake Bells.   Charles [name] is a 55 year old with a history of NICM, chronic systolic heart failure, DMII, HTN, HLD, ETOH, cocaine abuse, paranoid schizophrenia, tobacco abuse, PE and Hodgkins Lymph Stage I.  Admitted 10/10/20 with increased shortness of breath. Had A/C systolic heart failure. Diuresed with IV lasix.   Had ED visit 10/28/20 and 12/12/20  for suicidal ideation.   In December he was referred by his oncologist to CT surgery for mediastinoscopy for biopsy of lymph nodes.   Had COVID 11/2020   Admitted 12/18/20 for scheduled Robotic VATS and biopsy. Pathology concerning for lymphoma. Cardiology consulted to assist with HF meds. Diuresed with IV lasix and later stopped due to hypotension. Prior to d/c he was started on farxiga and continued on bb + entresto.Digoxin, lasix, and spiro stopped. He was discharged to [facility].  He was seen in the HF clinic 12/2020  Completed 17/20 radiation treatments.   Admitted to [facility] 03/23/21- 04/20/21 with suicidal ideation, tachycardia, and A/C systolic heart failure. EF <15%. Diuresed with IV lasix and transferred to lasix 80 mg twice a day.  Intolerant metoprolol + carvedilol due to hypotension and hypoxia but d/c on metoprolol. Assessed by psychiatry with meds adjusted. Discharged weight 229 pounds.   Living at [facility]   Admitted with hypotension and tachycardia. CXR with pulmonary edema. HS trop 18>17. TSH 3.6  Lactic Acid 2.6>1.5. Given total of 140 mg IV lasix. Negative >1 liter. Started on milrinone. Clearsite placed ---> CO 7.4 CI 2.6.   Blood Cultured obtained.   Complaining of  cough. Hypotensive this morning Started on norepi.    Cardiac Testing  10/11/2021 ECHO EF 20-25% RV not well visualized.   10/14/20 Cath Normal Cors, PA 34/22 mean 26, PCWP 26, CO 4.96 ,and CI 2.1   CTA 11/2020  1. No evidence of pulmonary embolus. 2. Basilar predominant interstitial and ground-glass opacities most consistent with COVID-19 pneumonia given clinical history. 3. Mediastinal and hilar lymphadenopathy. This may be a combination of known underlying lymphoma with superimposed reactive change given pneumonia. 4. Stable left ventricular dilatation   PET Scan 09/2020  1. Prevascular adenopathy with individual nodes measuring up to 2.4  cm in diameter, maximum SUV 6.4 ([place] 4 activity). 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 No  adenopathy or suspicious hypermetabolic activity in the neck,  abdomen/pelvis, or skeleton.  2. Hepatic steatosis.  3. Nonobstructive left nephrolithiasis.  4. Prostatomegaly.  5.  Aortic Atherosclerosis      Review of Systems: [y] = yes, _0  = no   General: Weight gain _1 ; Weight loss _2 ; Anorexia _3 ; Fatigue _4 ; Fever _5 ; Chills _6 ; Weakness [ Y]  Cardiac: Chest pain/pressure _7 ; Resting SOB _8 ; Exertional SOB [ Y]; Orthopnea _9 ; Pedal Edema _10 ; Palpitations _11 ; Syncope _12 ; Presyncope _13 ; Paroxysmal nocturnal dyspnea_14   Pulmonary: Cough [ Y]; Wheezing_15 ; Hemoptysis_16 ; Sputum _17 ; Snoring _18   GI: Vomiting_19 ; Dysphagia_20 ; Melena_21 ; Hematochezia _22 ; Heartburn_23 ; Abdominal pain _24 ; Constipation _25 ; Diarrhea _26 ;  BRBPR _0   GU: Hematuria_1 ; Dysuria _2 ; Nocturia_3   Vascular: Pain in legs with walking _4 ; Pain in feet with lying flat _5 ; Non-healing sores _6 ; Stroke _7 ; TIA _8 ; Slurred speech _9 ;  Neuro: Headaches_10 ; Vertigo_11 ; Seizures_12 ; Paresthesias_13 ;Blurred vision _14 ; Diplopia _15 ; Vision changes _16   Ortho/Skin: Arthritis _17 ; Joint pain [ Y]; Muscle pain _18 ; Joint  swelling _19 ; Back Pain [ Y]; Rash _20   Psych: Depression[ Y]; Anxiety_21   Heme: Bleeding problems _22 ; Clotting disorders _23 ; Anemia _24   Endocrine: Diabetes [ Y]; Thyroid dysfunction[ Y]  Home Medications Prior to Admission medications   Medication Sig Start Date End Date Taking? Authorizing Provider  acetaminophen (TYLENOL) 325 MG tablet Take 650 mg by mouth every 6 (six) hours as needed for moderate pain.   Yes [provider]  albuterol (PROVENTIL) (2.5 MG/3ML) 0.083% nebulizer solution Take 2.5 mg by nebulization in the morning and at bedtime.   Yes [provider]  albuterol (VENTOLIN HFA) 108 (90 Base) MCG/ACT inhaler Inhale 2 puffs into the lungs 4 (four) times daily as needed for wheezing or shortness of breath.   Yes [provider]  apixaban (ELIQUIS) 5 MG TABS tablet Take 5 mg by mouth 2 (two) times daily.   Yes [provider]  atorvastatin (LIPITOR) 10 MG tablet Take 10 mg by mouth daily. 09/30/20  Yes [provider]  dapagliflozin propanediol (FARXIGA) 10 MG TABS tablet Take 1 tablet (10 mg total) by mouth daily. 10/23/20  Yes Barb Merino, MD  furosemide (LASIX) 80 MG tablet Take 80 mg by mouth daily.   Yes [provider]  hydrOXYzine (VISTARIL) 25 MG capsule Take 25 mg by mouth See admin instructions. Tid as needed for anxiety for up to 10 days   Yes [provider]  levothyroxine (SYNTHROID) 100 MCG tablet Take 100 mcg by mouth daily before breakfast.   Yes [provider]  metoprolol succinate (TOPROL-XL) 25 MG 24 hr tablet TAKE 3 TABLETS (75 MG TOTAL) BY MOUTH DAILY. Patient taking differently: Take 25 mg by mouth daily. 01/16/21 01/16/22 Yes [name], MD  pantoprazole (PROTONIX) 40 MG tablet Take 40 mg by mouth daily before breakfast.   Yes [provider]  Polyethylene Glycol 3350 (PEG 3350) 17 g PACK Take 1 packet by mouth in the morning and at bedtime.   Yes [provider]   risperiDONE (RISPERDAL) 0.5 MG tablet Take 1 tablet (0.5 mg total) by mouth at bedtime. 12/14/20  Yes Dykstra, Ellwood Dense, MD  sacubitril-valsartan (ENTRESTO) 24-26 MG TAKE 1 TABLET BY MOUTH TWO TIMES DAILY. Patient taking differently: Take 1 tablet by mouth in the morning and at bedtime. 10/23/20 10/23/21 Yes Barb Merino, MD  senna (SENOKOT) 8.6 MG TABS tablet Take 2 tablets (17.2 mg total) by mouth 2 (two) times daily. Patient taking differently: Take 2 tablets by mouth 2 (two) times daily as needed for mild constipation. 10/23/20  Yes Barb Merino, MD  spironolactone (ALDACTONE) 25 MG tablet Take 0.5 tablets (12.5 mg total) by mouth daily. 01/07/21  Yes Madalyn Rob, MD  tamsulosin (FLOMAX) 0.4 MG CAPS capsule Take 0.4 mg by mouth in the morning. 09/08/20  Yes [provider]  blood glucose meter kit and supplies KIT Dispense based on patient and insurance preference. Use up to four times daily as directed. (FOR ICD-9 250.00, 250.01). 10/23/20   Barb Merino,  MD  brimonidine (ALPHAGAN) 0.2 % ophthalmic solution Place 1 drop into both eyes 2 (two) times daily.  Patient not taking: Reported on 04/29/2021    [provider]  digoxin (LANOXIN) 0.125 MG tablet Take 1 tablet (125 mcg total) by mouth daily. Patient not taking: Reported on 04/29/2021 12/29/20 12/29/21  Darrick Grinder D, NP  furosemide (LASIX) 80 MG tablet TAKE 1 TABLET (80 MG TOTAL) BY MOUTH TWO TIMES DAILY. Patient not taking: No sig reported 12/23/20 12/23/21  Jadene Pierini E, PA-C  Insulin Pen Needle (PEN NEEDLES 29GX1/2") 29G X 12MM MISC 1 each by Does not apply route daily. Patient not taking: Reported on 04/29/2021 10/23/20   Barb Merino, MD  Insulin Pen Needle 32G X 4 MM MISC USE AS DIRECTED. Patient not taking: Reported on 04/29/2021 10/23/20 10/23/21  Barb Merino, MD  melatonin 3 MG TABS tablet Take 3 mg by mouth at bedtime. Patient not taking: Reported on 04/29/2021 09/08/20   [provider]   polyethylene glycol (MIRALAX / GLYCOLAX) 17 g packet Take 17 g by mouth 2 (two) times daily. Patient not taking: Reported on 04/29/2021 10/23/20   Barb Merino, MD  polyethylene glycol powder (GLYCOLAX/MIRALAX) 17 GM/SCOOP powder TAKE 17 G BY MOUTH 2 (TWO) TIMES DAILY. Patient not taking: Reported on 04/29/2021 10/23/20 10/23/21  Barb Merino, MD  potassium chloride SA (KLOR-CON) 20 MEQ tablet TAKE 2 TABLETS (40 MEQ TOTAL) BY MOUTH DAILY. Patient not taking: Reported on 04/29/2021 12/23/20 12/23/21  Jadene Pierini E, PA-C  traMADol (ULTRAM) 50 MG tablet TAKE 1-2 TABLETS (50-100 MG TOTAL) BY MOUTH EVERY TWELVE HOURS AS NEEDED (MILD PAIN). Patient not taking: Reported on 04/29/2021 12/23/20 07/01/2021  John Giovanni, PA-C    Past Medical History: Past Medical History:  Diagnosis Date   Arthritis    lower back   Bipolar 1 disorder ([place])    Cancer ([place])    Cardiomyopathy, nonischemic ([place])    no CAD 10/14/20 cath, EF 20-25% by 10/11/20 echo   CHF (congestive heart failure) ([place])    COVID 10/2020   Depression    DM2 (diabetes mellitus, type 2) ([place])    HTN (hypertension)    Myocardial infarction [facility])     Past Surgical History: Past Surgical History:  Procedure Laterality Date   BACK SURGERY  01/2020   CARDIAC CATHETERIZATION  10/2020   CHOLECYSTECTOMY     HERNIA REPAIR     INTERCOSTAL NERVE BLOCK Left 12/18/2020   Procedure: INTERCOSTAL NERVE BLOCK;  Surgeon: Melrose Nakayama, MD;  Location: [place];  Service: Thoracic;  Laterality: Left;   LYMPH NODE BIOPSY Left 12/18/2020   Procedure: LYMPH NODE BIOPSY;  Surgeon: Melrose Nakayama, MD;  Location: [place];  Service: Thoracic;  Laterality: Left;   RIGHT/LEFT HEART CATH AND CORONARY ANGIOGRAPHY N/A 10/14/2020   Procedure: RIGHT/LEFT HEART CATH AND CORONARY ANGIOGRAPHY;  Surgeon: Jettie Booze, MD;  Location: [facility];  Service: Cardiovascular;  Laterality: N/A;   SPINAL FIXATION SURGERY  02/26/2020   XI ROBOTIC ASSISTED  THORASCOPY FOR BIOPSY AP WINDOW LYMPH NODES (Left)  12/18/2020    Family History: Family History  Problem Relation Age of Onset   Hypertension Mother    Hyperlipidemia Mother    Stroke Mother    Cancer Mother    Hypertension Father    Hyperlipidemia Father    Healthy Sister     Social History: Social History   Socioeconomic History   Marital status: Divorced    Spouse  name: Not on file   Number of children: 1   Years of education: Not on file   Highest education level: Not on file  Occupational History   Not on file  Tobacco Use   Smoking status: Some Days    Packs/day: 0.25    Pack years: 0.00    Types: Cigarettes   Smokeless tobacco: Never   Tobacco comments:    occasionally  Vaping Use   Vaping Use: Never used  Substance and Sexual Activity   Alcohol use: Never   Drug use: Not Currently    Types: Cocaine   Sexual activity: Not Currently  Other Topics Concern   Not on file  Social History Narrative   Not on file   Social Determinants of Health   Financial Resource Strain: Low Risk    Difficulty of Paying Living Expenses: Not hard at all  Food Insecurity: No Food Insecurity   Worried About Charity fundraiser in the Last Year: Never true   Ran Out of Food in the Last Year: Never true  Transportation Needs: No Transportation Needs   Lack of Transportation (Medical): No   Lack of Transportation (Non-Medical): No  Physical Activity: Insufficiently Active   Days of Exercise per Week: 7 days   Minutes of Exercise per Session: 10 min  Stress: No Stress Concern Present   Feeling of Stress : Only a little  Social Connections: Not on file    Allergies:  Allergies  Allergen Reactions   Ibuprofen Other (See Comments)    Unknown per MAR   Penicillins Rash    Objective:    Vital Signs:   Temp:  [96.6 F (35.9 C)-97.7 F (36.5 C)] 96.6 F (35.9 C) (06/23 0732) Pulse Rate:  [52-216] 113 (06/23 1030) Resp:  [0-54] 32 (06/23 1030) BP: (74-117)/(27-81)  83/59 (06/23 1030) SpO2:  [91 %-100 %] 93 % (06/23 1030) Arterial Line BP: (66-102)/(38-77) 76/44 (06/23 1030) FiO2 (%):  [30 %] 30 % (06/23 0400) Weight:  [99.8 kg-107.7 kg] 106.9 kg (06/22 2250) Last BM Date: 04/28/21  Weight change: Filed Weights   04/29/21 1702 04/29/21 2225 04/29/21 2250  Weight: 99.8 kg 107.7 kg 106.9 kg    Intake/Output:   Intake/Output Summary (Last 24 hours) at 04/30/2021 1131 Last data filed at 04/30/2021 1007 Gross per 24 hour  Intake 431.05 ml  Output 1850 ml  Net -1418.95 ml      Physical Exam    General:   No resp difficulty HEENT: normal Neck: supple. JVP 11-12 . Carotids 2+ bilat; no bruits. No lymphadenopathy or thyromegaly appreciated. Cor: PMI nondisplaced. Tachy Regular rate & rhythm. No rubs, or murmurs. +S3  Lungs: clear on 2 liters.  Abdomen: soft, nontender, nondistended. No hepatosplenomegaly. No bruits or masses. Good bowel sounds. Extremities: no cyanosis, clubbing, rash, R and LLE 1+ edema Neuro: alert & orientedx3, cranial nerves grossly intact. moves all 4 extremities w/o difficulty. Affect pleasant   Telemetry   Sinus Tach 120s   EKG     Sinus Tach   Labs   Basic Metabolic Panel: Recent Labs  Lab 04/29/21 1803 04/29/21 1835 04/30/21 0451  NA 132* 132*  132* 134*  K 4.5 4.4  4.4 4.1  CL 98 100 99  CO2 20*  --  24  GLUCOSE 149* 146* 89  BUN 27* 28* 25*  CREATININE 1.73* 1.70* 1.46*  CALCIUM 9.0  --  9.1  MG  --   --  2.2  PHOS  --   --  4.9*    Liver Function Tests: Recent Labs  Lab 04/29/21 1803  AST 25  ALT 36  ALKPHOS 162*  BILITOT 1.0  PROT 6.8  ALBUMIN 3.1*   No results for input(s): LIPASE, AMYLASE in the last 168 hours. No results for input(s): AMMONIA in the last 168 hours.  CBC: Recent Labs  Lab 04/29/21 1803 04/29/21 1835 04/30/21 0451  WBC 8.7  --  6.4  NEUTROABS 7.2  --   --   HGB 13.8 15.6  16.0 14.1  HCT 43.2 46.0  47.0 42.3  MCV 78.0*  --  75.4*  PLT 291  --  191     Cardiac Enzymes: No results for input(s): CKTOTAL, CKMB, CKMBINDEX, TROPONINI in the last 168 hours.  BNP: BNP (last 3 results) Recent Labs    01/11/21 1234 01/13/21 1741 04/29/21 1803  BNP 438.6* 433.1* 1,156.5*    ProBNP (last 3 results) No results for input(s): PROBNP in the last 8760 hours.   CBG: Recent Labs  Lab 04/29/21 2146 04/29/21 2309 04/30/21 0307 04/30/21 0712 04/30/21 1105  GLUCAP 133* 124* 95 129* 118*    Coagulation Studies: No results for input(s): LABPROT, INR in the last 72 hours.   Imaging   DG Chest Port 1 View  Result Date: 04/30/2021 CLINICAL DATA:  Shortness of breath. EXAM: PORTABLE CHEST 1 VIEW COMPARISON:  04/29/2021.  PET-CT 02/06/2021.  CT 01/13/2021. FINDINGS: Mediastinal fullness again noted consistent previously identified adenopathy. Cardiomegaly. No pulmonary venous congestion. Low lung volumes. Bilateral perihilar infiltrates/edema noted. Small left pleural effusion. No pneumothorax. IMPRESSION: 1. Mediastinal fullness again noted consistent previously identified adenopathy. 2.  Cardiomegaly.  No pulmonary venous congestion. 3. Low lung volumes. Bilateral perihilar infiltrates/edema noted. Small left pleural effusion. Electronically Signed   By: Marcello Moores  Register   On: 04/30/2021 05:19   DG Chest Portable 1 View  Result Date: 04/29/2021 CLINICAL DATA:  Short of breath, cough.  History of lymphoma EXAM: PORTABLE CHEST 1 VIEW COMPARISON:  Radiograph 04/25/2021 FINDINGS: Cardiac silhouette is enlarged similar prior. There is perihilar densities suggesting lymphadenopathy. There is superimposed central airspace disease which is slightly worse than the prior. IMPRESSION: 1. Mediastinal lymphadenopathy. 2. Increased central airspace disease suggest pulmonary edema. 3. Cardiomegaly. Electronically Signed   By: Suzy Bouchard M.D.   On: 04/29/2021 18:08   [place] EKG SITE RITE  Result Date: 04/30/2021 If [facility] image not attached, placement  could not be confirmed due to current cardiac rhythm.    Medications:     Current Medications:  apixaban  5 mg Oral BID   atorvastatin  10 mg Oral Daily   Chlorhexidine Gluconate Cloth  6 each Topical Daily   Chlorhexidine Gluconate Cloth  6 each Topical Q0600   digoxin  125 mcg Oral Daily   furosemide  80 mg Intravenous BID   insulin aspart  0-15 Units Subcutaneous Q4H   levothyroxine  100 mcg Oral QAC breakfast   mupirocin ointment  1 application Nasal BID   pantoprazole  40 mg Oral Daily   risperiDONE  0.5 mg Oral QHS    Infusions:  sodium chloride     milrinone 0.5478 mcg/kg/min (04/30/21 1000)   norepinephrine (LEVOPHED) Adult infusion 2 mcg/min (04/30/21 1130)      Patient Profile   Charles [name] is a 55 year old with a history of NICM, chronic systolic heart failure, DMII, HTN, HLD, ETOH, cocaine abuse, paranoid schizophrenia, tobacco abuse, and Hodgkins Lymph Stage I.  Quit smoking 12/17/20.  Admitted with hypotension and tachycardia-->cardiogenic shock.   Assessment/Plan   Cardiogenic Shock  -[facility] with normal cors.  -ECHO --EF < 15%.  Repeat ECHO today. ---> EF < 20%  - Blood CX- obtained.  - Hypotensive. Start norepi. Place PICC for CO-OX and CVP.  - No bb/arb with shock.  - Hold SGLT2i - Not a candidate for advanced therapies.   2. SInus Tach  -Suspect in the setting of low output heart failure.  - TSH ok - No bb.   3. AKI  Renal function improving with inotropes.  Creatinine baseline 1.3-1.5   4. Non Hodgkins Lymphoma - Completed 17/20 radiation treatments.   5. DMII -   6. Hypothyroidisim. -TSH ok  -On levothyroxine.   7. H/O Suicidal Ideation  8. H/O PE On eliquis 5 mg twice a day   Social - HFSW consulted. Multiple admits over the last year.. Over the last 6 months at least 8 ED visits at [facility] for suicidal ideation with exception of  Will also benefit from palliative care for [place].      Length of Stay: 1  Amy Clegg, NP   04/30/2021, 11:31 AM  Advanced Heart Failure Team Pager [phone] (M-F; 7a - 5p)  Please contact [facility] for night-coverage after hours (4p -7a ) and weekends on [email or web address]  55 y/o male as above with complicated PMHx whom we are asked to see for advanced HF.   Recently at [facility] for nearly 1 month for treatment of his HF. EF < 15%. Was discharged about 1 week ago. Now readmitted with low output HF and lactic acidosis. Now on milrinone and NE. Had 20 beat run VT this afternoon. Being treated with XRT for lymphoma.   General:  Sitting up in bed No resp difficulty HEENT: normal Neck: supple. nJVP 8-9 Carotids 2+ bilat; no bruits. No lymphadenopathy or thryomegaly appreciated. Cor: PMI nondisplaced. Tachy + s3 Lungs: clear Abdomen: soft, nontender, nondistended. No hepatosplenomegaly. No bruits or masses. Good bowel sounds. Extremities: no cyanosis, clubbing, rash, edema Neuro: alert & orientedx3, cranial nerves grossly intact. moves all 4 extremities w/o difficulty. Affect pleasant  He has end-stage HF with resuscitated cardiogenic shock. I had a long talk with him and his fiancee' Rhonda. I discussed how severe his HF is and how limited his options are. He said he is tired of coming in/out of the hospital. I told him that there was little left we could do for his heart as he is not candidate for advanced therapies. He is interested in possible residential Hospice care. We will consult Palliative Care. We discussed Code Status and he is now DNR/DNI.   D/w Dr. Lake Bells.   CRITICAL CARE Performed by: Glori Bickers  Total critical care time: 35 minutes  Critical care time was exclusive of separately billable procedures and treating other patients.  Critical care was necessary to treat or prevent imminent or life-threatening deterioration.  Critical care was time spent personally by me (independent of midlevel providers or residents) on the following activities: development of  treatment plan with patient and/or surrogate as well as nursing, discussions with consultants, evaluation of patient's response to treatment, examination of patient, obtaining history from patient or surrogate, ordering and performing treatments and interventions, ordering and review of laboratory studies, ordering and review of radiographic studies, pulse oximetry and re-evaluation of patient's condition.   Glori Bickers, MD  6:53 PM

## 2021-04-30 NOTE — Progress Notes (Signed)
NAME:  Charles [name], MRN:  [id number], DOB:  August 17, 1966, LOS: 1 ADMISSION DATE:  04/29/2021, CONSULTATION DATE:  6/22 REFERRING MD:  Dr. Roslynn Amble, CHIEF COMPLAINT:  SOB/cough   History of Present Illness:  Charles [name] is a 55 yo M w/ pertinent PMH of DM2, HTN, HFrEF (EF 20-25%) due to NICM, Hodgkin's lymphoma, and schizophrenia presents to [facility] on 6/22 w/ cough and SOB.  Charles [name] recently admitted in Dec 4854 w/ acute systolic HF w/ EF 62-70%. Charles [name] also had mediastinal adenopathy and underwent lymph node biopsy. February 2022, Charles [name] seen in outpatient heart failure clinic and was given 40 Lasix and digoxin 0.125. Was also seen by hematology in February 2022 and was diagnosed stage I nodular lymphocyte Hodgkin's lymphoma. Charles [name]'s last radiation therapy given one month ago at [facility]. Charles [name] has had several admissions to hospital for suicidal ideations this year. Charles [name] has also had several admissions for CHF exacerbations this year. On 5/16 Charles [name] admitted to [facility] w/ decompensated HFrEF and PE. Was sent home on Apixaban. Charles [name] recently presented to ED on 6/18 with cough/SOB and ran out of his home oxygen. Was given robitussin for cough and O2 was set up for him at his facility and was discharged.  Charles [name] states his cough/sob has been progressively worsening since 04/26/2021. He wears O2 at home intermittently when SOB. He has orthopnea and is unable to walk without getting SOB. Does not take inhalers and denies history of lung disease. He states he has been taking his medications at home. Charles [name] states his BLE are swelling more than usual, but has not been checking his daily weights at home. Denies fever.  Presents to [facility] ER on 6/22. ED course: Charles [name] sob, coughing, and RR in 30s. Placed on 4 liters [place] with sats mid 90s. Charles [name] is also hypotensive and EKG shows normal sinus tach. Creatinine is 1.7. BNP 1,156.5. CXR shows cardiomegaly and pulmonary edema. Normal WBC, VBG, and Hgb. Covid and Flu  negative. Cardiology consulted.  PCCM consulted for admission to ICU, management of cardiogenic shock and respiratory management.  Pertinent  Medical History    has a past medical history of Arthritis, Bipolar 1 disorder ([place]), Cancer ([place]), Cardiomyopathy, nonischemic ([place]), CHF (congestive heart failure) ([place]), COVID (10/2020), Depression, DM2 (diabetes mellitus, type 2) ([place]), HTN (hypertension), and Myocardial infarction ([place]).   Significant Hospital Events: Including procedures, antibiotic start and stop dates in addition to other pertinent events   6/22: Charles [name] admitted to [facility] 3/50 for acute systolic CHF exacerbation and cardiogenic shock. Placed on BiPAP. Milrinone and Lasix started 6/23 Off bipap, CI and CO improved, on Milrinone and not requiring pressors  Interim History / Subjective:  Sleeping but arousable and asking to eat Off Bipap and saturating well on [place] 650cc OUP after Lasix 13m   Objective   Blood pressure 92/66, pulse (!) 104, temperature (!) 96.6 F (35.9 C), temperature source Axillary, resp. rate (!) 32, height 5' 9"  (1.753 m), weight 106.9 kg, SpO2 91 %.    FiO2 (%):  [30 %] 30 %   Intake/Output Summary (Last 24 hours) at 04/30/2021 0732 Last data filed at 04/30/2021 0500 Gross per 24 hour  Intake 62.85 ml  Output 650 ml  Net -587.15 ml   Filed Weights   04/29/21 1702 04/29/21 2225 04/29/21 2250  Weight: 99.8 kg 107.7 kg 106.9 kg    General:  well nourished M,m fatigued appearing but in no  acute distress HEENT: MM pink/moist, on nasal cannula, sclera anicteric Neuro: sleeping,  arouses to voice and able to coverse, oriented to person and situation, moving all extremities without obvious focal neuro deficit CV: s1s2 rrr, no m/r/g PULM:  no retractions or tachypnea on [place], mild crackles bilateral bases, no wheezing GI: soft, bsx4 active  Extremities: warm/dry, 1+ edema  Skin: no rashes or lesions   Labs/imaging that I havepersonally reviewed   (right click and "Reselect all SmartList Selections" daily)  Creatinine 1.4 (improved) WBC 6.4 Glu 122 Trop 17  Echo-pending  Resolved Hospital Problem list     Assessment & Plan:    Acute hypoxic respiratory failure secondary to acute volume overload and Cardiogenic shock superimposed on chronic HFrEF  Given Lasix 58mx1 and started on Milrinone  Improved and off Bipap this AM P: -Titrate Milrinone from 0.711m/kg to 0.33m88mkg and diurese with Lasix 80m52md  -Continue home digoxin, appreciate cardiology recs and defer resuming home beta blocker and Entresto -Not on pressors, start peripheral Levophed if needed to maintain MAP >65 -CI 2.5 and CO 5 this AM -Echo pending  -monitor UOP and respiratory status  -lactic acid downtrended to normal    Cardiorenal syndrome w/ AKI Creatinine improved with the above P: -continue to follow renal indices, UOP and electrolytes and avoid nephrotoxic medications   Pulmonary Embolism  03/23/2021 [facility] significant RH strain and was sent home on Apixaban for 6 months P: -Overall improved without elevated troponin, resume Apixaban  Hx of DM2 P: -SSI and glucose monitoring  Hx of HTN, HLD P: -hold Metoprolol secondary to hypotension and resume statin  Hx of MDD and schizophrenia P: -Resume Risperdal  Hx of Hypothyroidism TSH WNL P: -continue Synthroid  Hx of BPH P: -Hold home flomax   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 S/p robotic assisted thoracoscopy 12/18/20 confirming dx Follows with Dr. DorsLorenso Couriercology, and Dr. SquiIsidore [facility].  Treated with radiation monotherapy, missed last follow up appt.  P: -continue outpatient oncology follow up   Best Practice (right click and "Reselect all SmartList Selections" daily)   Diet/type: Regular consistency (see orders) Pain/Anxiety/Delirium protocol Not indicated VAP protocol (if indicated): Not indicated DVT  prophylaxis: DOAC GI prophylaxis: PPI Glucose control:  SSI Central venous access:  N/A Arterial line:  N/A Foley:  N/A Mobility:  OOB  PT consulted: N/A Studies pending: other Echo Culture data pending:blood Last reviewed culture data:today Antibiotics:not indicated  Antibiotic de-escalation: N/A Stop date: N/A Daily labs: ordered Code Status:  full code Last date of multidisciplinary goals of care discussion [pending] ccm prognosis: Serious Disposition: remains critically ill, will stay in intensive care until off Milrinone and BP stabilized      Labs   CBC: Recent Labs  Lab 04/29/21 1803 04/29/21 1835 04/30/21 0451  WBC 8.7  --  6.4  NEUTROABS 7.2  --   --   HGB 13.8 15.6  16.0 14.1  HCT 43.2 46.0  47.0 42.3  MCV 78.0*  --  75.4*  PLT 291  --  191     Basic Metabolic Panel: Recent Labs  Lab 04/29/21 1803 04/29/21 1835 04/30/21 0451  NA 132* 132*  132* 134*  K 4.5 4.4  4.4 4.1  CL 98 100 99  CO2 20*  --  24  GLUCOSE 149* 146* 89  BUN 27* 28* 25*  CREATININE 1.73* 1.70* 1.46*  CALCIUM 9.0  --  9.1  MG  --   --  2.2  PHOS  --   --  4.9*  GFR: Estimated Creatinine Clearance: 67.3 mL/min (A) (by C-G formula based on SCr of 1.46 mg/dL (H)). Recent Labs  Lab 04/29/21 1803 04/29/21 1927 04/30/21 0451  WBC 8.7  --  6.4  LATICACIDVEN 2.6* 1.5  --      Liver Function Tests: Recent Labs  Lab 04/29/21 1803  AST 25  ALT 36  ALKPHOS 162*  BILITOT 1.0  PROT 6.8  ALBUMIN 3.1*    No results for input(s): LIPASE, AMYLASE in the last 168 hours. No results for input(s): AMMONIA in the last 168 hours.  ABG    Component Value Date/Time   PHART 7.457 (H) 01/04/2021 2139   PCO2ART 36.8 01/04/2021 2139   PO2ART 94.6 01/04/2021 2139   HCO3 23.0 04/29/2021 1835   TCO2 24 04/29/2021 1835   TCO2 22 04/29/2021 1835   ACIDBASEDEF 1.0 04/29/2021 1835   O2SAT 97.0 04/29/2021 1835      Coagulation Profile: No results for input(s): INR, PROTIME  in the last 168 hours.  Cardiac Enzymes: No results for input(s): CKTOTAL, CKMB, CKMBINDEX, TROPONINI in the last 168 hours.  HbA1C: Hgb A1c MFr Bld  Date/Time Value Ref Range Status  04/29/2021 09:48 PM 7.3 (H) 4.8 - 5.6 % Final    Comment:    (NOTE) Pre diabetes:          5.7%-6.4%  Diabetes:              >6.4%  Glycemic control for   <7.0% adults with diabetes   01/05/2021 02:18 AM 7.4 (H) 4.8 - 5.6 % Final    Comment:    (NOTE) Pre diabetes:          5.7%-6.4%  Diabetes:              >6.4%  Glycemic control for   <7.0% adults with diabetes     CBG: Recent Labs  Lab 04/29/21 2146 04/29/21 2309 04/30/21 0307 04/30/21 0712  GLUCAP 133* 124* 95 129*      Past Medical History:  He,  has a past medical history of Arthritis, Bipolar 1 disorder ([place]), Cancer ([place]), Cardiomyopathy, nonischemic ([place]), CHF (congestive heart failure) ([place]), COVID (10/2020), Depression, DM2 (diabetes mellitus, type 2) ([place]), HTN (hypertension), and Myocardial infarction ([place]).   Surgical History:   Past Surgical History:  Procedure Laterality Date   BACK SURGERY  01/2020   CARDIAC CATHETERIZATION  10/2020   CHOLECYSTECTOMY     HERNIA REPAIR     INTERCOSTAL NERVE BLOCK Left 12/18/2020   Procedure: INTERCOSTAL NERVE BLOCK;  Surgeon: Melrose Nakayama, MD;  Location: [facility];  Service: Thoracic;  Laterality: Left;   LYMPH NODE BIOPSY Left 12/18/2020   Procedure: LYMPH NODE BIOPSY;  Surgeon: Melrose Nakayama, MD;  Location: [facility];  Service: Thoracic;  Laterality: Left;   RIGHT/LEFT HEART CATH AND CORONARY ANGIOGRAPHY N/A 10/14/2020   Procedure: RIGHT/LEFT HEART CATH AND CORONARY ANGIOGRAPHY;  Surgeon: Jettie Booze, MD;  Location: [facility];  Service: Cardiovascular;  Laterality: N/A;   SPINAL FIXATION SURGERY  02/26/2020   XI ROBOTIC ASSISTED THORASCOPY FOR BIOPSY AP WINDOW LYMPH NODES (Left)  12/18/2020     Social History:   reports that he has been smoking  cigarettes. He has been smoking an average of 0.25 packs per day. He has never used smokeless tobacco. He reports previous drug use. Drug: Cocaine. He reports that he does not drink alcohol.   Family History:  His family history includes Cancer in his mother; Healthy  in his sister; Hyperlipidemia in his father and mother; Hypertension in his father and mother; Stroke in his mother.   Allergies Allergies  Allergen Reactions   Ibuprofen Other (See Comments)    Unknown per MAR   Penicillins Rash     Home Medications  Prior to Admission medications   Medication Sig Start Date End Date Taking? Authorizing Provider  acetaminophen (TYLENOL) 325 MG tablet Take 650 mg by mouth every 6 (six) hours as needed for moderate pain.    [provider]  atorvastatin (LIPITOR) 10 MG tablet Take 10 mg by mouth daily. 09/30/20   [provider]  blood glucose meter kit and supplies KIT Dispense based on Charles [name] and insurance preference. Use up to four times daily as directed. (FOR ICD-9 250.00, 250.01). 10/23/20   Barb Merino, MD  brimonidine (ALPHAGAN) 0.2 % ophthalmic solution Place 1 drop into both eyes 2 (two) times daily.     [provider]  dapagliflozin propanediol (FARXIGA) 10 MG TABS tablet Take 1 tablet (10 mg total) by mouth daily. 10/23/20   Barb Merino, MD  digoxin (LANOXIN) 0.125 MG tablet Take 1 tablet (125 mcg total) by mouth daily. Charles [name] taking differently: Take 0.125 mg by mouth daily. 12/29/20 12/29/21  Clegg, Amy D, NP  furosemide (LASIX) 80 MG tablet TAKE 1 TABLET (80 MG TOTAL) BY MOUTH TWO TIMES DAILY. Charles [name] taking differently: Take 80 mg by mouth daily. 12/23/20 12/23/21  Gold, Wilder Glade, PA-C  Insulin Pen Needle (PEN NEEDLES 29GX1/2") 29G X 12MM MISC 1 each by Does not apply route daily. 10/23/20   Barb Merino, MD  Insulin Pen Needle 32G X 4 MM MISC USE AS DIRECTED. 10/23/20 10/23/21  Barb Merino, MD  levothyroxine (SYNTHROID) 100 MCG tablet Take  100 mcg by mouth daily before breakfast.    [provider]  melatonin 3 MG TABS tablet Take 3 mg by mouth at bedtime. 09/08/20   [provider]  metoprolol succinate (TOPROL-XL) 25 MG 24 hr tablet TAKE 3 TABLETS (75 MG TOTAL) BY MOUTH DAILY. Charles [name] taking differently: Take 75 mg by mouth daily. 01/16/21 01/16/22  [name], MD  pantoprazole (PROTONIX) 40 MG tablet Take 40 mg by mouth daily before breakfast.    [provider]  polyethylene glycol (MIRALAX / GLYCOLAX) 17 g packet Take 17 g by mouth 2 (two) times daily. 10/23/20   Barb Merino, MD  polyethylene glycol powder (GLYCOLAX/MIRALAX) 17 GM/SCOOP powder TAKE 17 G BY MOUTH 2 (TWO) TIMES DAILY. Charles [name] taking differently: Take 17 g by mouth in the morning and at bedtime. 10/23/20 10/23/21  Barb Merino, MD  potassium chloride SA (KLOR-CON) 20 MEQ tablet TAKE 2 TABLETS (40 MEQ TOTAL) BY MOUTH DAILY. Charles [name] taking differently: Take 40 mEq by mouth daily. 12/23/20 12/23/21  Jadene Pierini E, PA-C  risperiDONE (RISPERDAL) 0.5 MG tablet Take 1 tablet (0.5 mg total) by mouth at bedtime. 12/14/20   Lucrezia Starch, MD  sacubitril-valsartan (ENTRESTO) 24-26 MG TAKE 1 TABLET BY MOUTH TWO TIMES DAILY. 10/23/20 10/23/21  Barb Merino, MD  senna (SENOKOT) 8.6 MG TABS tablet Take 2 tablets (17.2 mg total) by mouth 2 (two) times daily. 10/23/20   Barb Merino, MD  spironolactone (ALDACTONE) 25 MG tablet Take 0.5 tablets (12.5 mg total) by mouth daily. 01/07/21   Madalyn Rob, MD  tamsulosin (FLOMAX) 0.4 MG CAPS capsule Take 0.4 mg by mouth in the morning. 09/08/20   [provider]  traMADol (ULTRAM) 50 MG tablet TAKE 1-2 TABLETS (  50-100 MG TOTAL) BY MOUTH EVERY TWELVE HOURS AS NEEDED (MILD PAIN). Charles [name] taking differently: Take 50-100 mg by mouth every 12 (twelve) hours as needed for moderate pain. 12/23/20 07/01/2021  John Giovanni, PA-C     Critical care time: 35 minutes    CRITICAL CARE Performed by: Otilio Carpen  Alyna Stensland   Total critical care time: 35 minutes  Critical care time was exclusive of separately billable procedures and treating other patients.  Critical care was necessary to treat or prevent imminent or life-threatening deterioration.  Critical care was time spent personally by me on the following activities: development of treatment plan with Charles [name] and/or surrogate as well as nursing, discussions with consultants, evaluation of Charles [name]'s response to treatment, examination of Charles [name], obtaining history from Charles [name] or surrogate, ordering and performing treatments and interventions, ordering and review of laboratory studies, ordering and review of radiographic studies, pulse oximetry and re-evaluation of Charles [name]'s condition.   Otilio Carpen Teandra Harlan, PA-C [facility] See Amion for pager If no response to pager , please call [phone] until 7pm After 7:00 pm call Elink  [phone]

## 2021-05-01 DIAGNOSIS — Z66 Do not resuscitate: Secondary | ICD-10-CM

## 2021-05-01 DIAGNOSIS — I5021 Acute systolic (congestive) heart failure: Secondary | ICD-10-CM

## 2021-05-01 DIAGNOSIS — C8112 Nodular sclerosis classical Hodgkin lymphoma, intrathoracic lymph nodes: Secondary | ICD-10-CM

## 2021-05-01 DIAGNOSIS — Z7189 Other specified counseling: Secondary | ICD-10-CM

## 2021-05-01 LAB — GLUCOSE, CAPILLARY
Glucose-Capillary: 141 mg/dL — ABNORMAL HIGH (ref 70–99)
Glucose-Capillary: 118 mg/dL — ABNORMAL HIGH (ref 70–99)
Glucose-Capillary: 122 mg/dL — ABNORMAL HIGH (ref 70–99)
Glucose-Capillary: 127 mg/dL — ABNORMAL HIGH (ref 70–99)
Glucose-Capillary: 120 mg/dL — ABNORMAL HIGH (ref 70–99)
Glucose-Capillary: 136 mg/dL — ABNORMAL HIGH (ref 70–99)

## 2021-05-01 LAB — BASIC METABOLIC PANEL
Anion gap: 8 (ref 5–15)
Anion gap: 7 (ref 5–15)
BUN: 18 mg/dL (ref 6–20)
BUN: 15 mg/dL (ref 6–20)
CO2: 26 mmol/L (ref 22–32)
CO2: 28 mmol/L (ref 22–32)
Calcium: 8.1 mg/dL — ABNORMAL LOW (ref 8.9–10.3)
Calcium: 8.4 mg/dL — ABNORMAL LOW (ref 8.9–10.3)
Chloride: 95 mmol/L — ABNORMAL LOW (ref 98–111)
Chloride: 96 mmol/L — ABNORMAL LOW (ref 98–111)
Creatinine, Ser: 1.16 mg/dL (ref 0.61–1.24)
Creatinine, Ser: 1.08 mg/dL (ref 0.61–1.24)
GFR, Estimated: >60 mL/min (ref >60)
GFR, Estimated: >60 mL/min (ref >60)
Glucose, Bld: 206 mg/dL — ABNORMAL HIGH (ref 70–99)
Glucose, Bld: 160 mg/dL — ABNORMAL HIGH (ref 70–99)
Potassium: 2.9 mmol/L — ABNORMAL LOW (ref 3.5–5.1)
Potassium: 4.5 mmol/L (ref 3.5–5.1)
Sodium: 129 mmol/L — ABNORMAL LOW (ref 135–145)
Sodium: 131 mmol/L — ABNORMAL LOW (ref 135–145)

## 2021-05-01 LAB — COOXEMETRY PANEL
Carboxyhemoglobin: 1.1 % (ref 0.5–1.5)
Methemoglobin: 0.7 % (ref 0.0–1.5)
O2 Saturation: 78.6 %
Total hemoglobin: 14 g/dL (ref 12.0–16.0)

## 2021-05-01 LAB — CBC
HCT: 38.5 % — ABNORMAL LOW (ref 39.0–52.0)
Hemoglobin: 12.6 g/dL — ABNORMAL LOW (ref 13.0–17.0)
MCH: 25 pg — ABNORMAL LOW (ref 26.0–34.0)
MCHC: 32.7 g/dL (ref 30.0–36.0)
MCV: 76.5 fL — ABNORMAL LOW (ref 80.0–100.0)
Platelets: 256 10*3/uL (ref 150–400)
RBC: 5.03 MIL/uL (ref 4.22–5.81)
RDW: 17.4 % — ABNORMAL HIGH (ref 11.5–15.5)
WBC: 8.3 10*3/uL (ref 4.0–10.5)
nRBC: 0 % (ref 0.0–0.2)

## 2021-05-01 LAB — MAGNESIUM: Magnesium: 1.8 mg/dL (ref 1.7–2.4)

## 2021-05-01 MED ORDER — POTASSIUM CHLORIDE CRYS ER 20 MEQ PO TBCR
40.0000 meq | EXTENDED_RELEASE_TABLET | ORAL | Status: AC
  Administered 2021-05-01 (×2): 40 meq via ORAL
  Filled 2021-05-01 (×2): qty 2

## 2021-05-01 MED ORDER — MAGNESIUM SULFATE 2 GM/50ML IV SOLN
2.0000 g | Freq: Once | INTRAVENOUS | Status: AC
  Administered 2021-05-01: 2 g via INTRAVENOUS
  Filled 2021-05-01: qty 50

## 2021-05-01 MED ORDER — ORAL CARE MOUTH RINSE
15.0000 mL | Freq: Two times a day (BID) | OROMUCOSAL | Status: DC
  Administered 2021-05-02 – 2021-05-03 (×4): 15 mL via OROMUCOSAL

## 2021-05-01 MED ORDER — POTASSIUM CHLORIDE 10 MEQ/50ML IV SOLN
10.0000 meq | INTRAVENOUS | Status: AC
  Administered 2021-05-01 (×3): 10 meq via INTRAVENOUS
  Filled 2021-05-01 (×3): qty 50

## 2021-05-01 MED ORDER — FUROSEMIDE 10 MG/ML IJ SOLN
80.0000 mg | Freq: Three times a day (TID) | INTRAMUSCULAR | Status: DC
  Administered 2021-05-01 – 2021-05-04 (×9): 80 mg via INTRAVENOUS
  Filled 2021-05-01 (×9): qty 8

## 2021-05-01 NOTE — Progress Notes (Addendum)
Advanced Heart Failure Rounding Note  PCP-Cardiologist: [name], MD    Patient Profile    Charles [name] is a 55 year old with a history of NICM, chronic systolic heart failure, DMII, HTN, HLD, ETOH, cocaine abuse, paranoid schizophrenia, tobacco abuse, and Hodgkins Lymph Stage I.  Quit smoking 12/17/20.    Recently at [facility] for nearly 1 month for treatment of his HF. EF < 15%. Was discharged about 1 week ago. Now readmitted with low output HF and lactic acidosis. Now on milrinone and NE.  Frequent NSVT. Not candidate for advanced therapies. Interested in hospice.     Subjective:    Awaiting palliative care consult.   Currently on milrinone 0.5 + NE 4. Co-ox 79%. Sinus tach w/ frequent NSVT.  K 2.9 replacement ordered.   Good response to IV Lasix, 3L out thus far today. CVP 9   Sitting up in chair. Main complaint is cough. Asking for cough medicine.    Objective:   Weight Range: 106.3 kg Body mass index is 34.61 kg/m.   Vital Signs:   Temp:  [97.1 F (36.2 C)-99.2 F (37.3 C)] 98 F (36.7 C) (06/24 0700) Pulse Rate:  [109-130] 126 (06/24 1215) Resp:  [6-46] 25 (06/24 1215) BP: (71-256)/(33-234) 101/62 (06/24 1215) SpO2:  [87 %-99 %] 99 % (06/24 1215) Arterial Line BP: (87-114)/(62-89) 99/74 (06/23 1715) Weight:  [106.3 kg] 106.3 kg (06/24 0500) Last BM Date: 04/28/21  Weight change: Filed Weights   04/29/21 2225 04/29/21 2250 05/01/21 0500  Weight: 107.7 kg 106.9 kg 106.3 kg    Intake/Output:   Intake/Output Summary (Last 24 hours) at 05/01/2021 1240 Last data filed at 05/01/2021 1200 Gross per 24 hour  Intake 1692.74 ml  Output 5583 ml  Net -3890.26 ml      Physical Exam    CVP 9  General:  chronically ill appearing. Coughing.  HEENT: Normal Neck: Supple. JVP 9 cm . Carotids 2+ bilat; no bruits. No lymphadenopathy or thyromegaly appreciated. Cor: PMI nondisplaced. Regular rhythm, tachy rate. No rubs, gallops or murmurs. Lungs: decreased BS at  the bases bilaterally  Abdomen: Soft, nontender, nondistended. No hepatosplenomegaly. No bruits or masses. Good bowel sounds. Extremities: No cyanosis, clubbing, rash, 1+ bilateral LEE edema Neuro: Alert & orientedx3, cranial nerves grossly intact. moves all 4 extremities w/o difficulty. Affect pleasant   Telemetry   Sinus 110s, runs of NSVT   EKG    No new EKGs to review   Labs    CBC Recent Labs    04/29/21 1803 04/29/21 1835 04/30/21 0451 05/01/21 0652  WBC 8.7  --  6.4 8.3  NEUTROABS 7.2  --   --   --   HGB 13.8   < > 14.1 12.6*  HCT 43.2   < > 42.3 38.5*  MCV 78.0*  --  75.4* 76.5*  PLT 291  --  191 256   < > = values in this interval not displayed.   Basic Metabolic Panel Recent Labs    04/30/21 0451 05/01/21 0652  NA 134* 129*  K 4.1 2.9*  CL 99 95*  CO2 24 26  GLUCOSE 89 206*  BUN 25* 18  CREATININE 1.46* 1.16  CALCIUM 9.1 8.1*  MG 2.2 1.8  PHOS 4.9*  --    Liver Function Tests Recent Labs    04/29/21 1803  AST 25  ALT 36  ALKPHOS 162*  BILITOT 1.0  PROT 6.8  ALBUMIN 3.1*   No results for input(s):  LIPASE, AMYLASE in the last 72 hours. Cardiac Enzymes No results for input(s): CKTOTAL, CKMB, CKMBINDEX, TROPONINI in the last 72 hours.  BNP: BNP (last 3 results) Recent Labs    01/11/21 1234 01/13/21 1741 04/29/21 1803  BNP 438.6* 433.1* 1,156.5*    ProBNP (last 3 results) No results for input(s): PROBNP in the last 8760 hours.   D-Dimer No results for input(s): DDIMER in the last 72 hours. Hemoglobin A1C Recent Labs    04/29/21 2148  HGBA1C 7.3*   Fasting Lipid Panel No results for input(s): CHOL, HDL, LDLCALC, TRIG, CHOLHDL, LDLDIRECT in the last 72 hours. Thyroid Function Tests Recent Labs    04/29/21 2148  TSH 3.610    Other results:   Imaging    ECHOCARDIOGRAM COMPLETE  Result Date: 04/30/2021    ECHOCARDIOGRAM REPORT   Patient Name:   Charles [name] Date of Exam: 04/30/2021 Medical Rec #:  [id number]   Height:        69.0 in Accession #:    [id number]  Weight:       235.7 lb Date of Birth:  [birth year]    BSA:          2.215 m Patient Age:    51 years    BP:           93/66 mmHg Patient Gender: M           HR:           114 bpm. Exam Location:  Inpatient Procedure: 2D Echo, Cardiac Doppler, Color Doppler and Intracardiac            Opacification Agent Indications:    I50.9* Heart failure (unspecified)  History:        Patient has prior history of Echocardiogram examinations, most                 recent 01/04/2021. Signs/Symptoms:Shortness of Breath.  Sonographer:    Merrie Roof RDCS Referring Phys: [place]  1. Left ventricular ejection fraction, by estimation, is <20%. The left ventricle has severely decreased function. The left ventricle demonstrates global hypokinesis. The left ventricular internal cavity size was severely dilated. Left ventricular diastolic parameters are consistent with Grade III diastolic dysfunction (restrictive).  2. Right ventricular systolic function is mildly reduced. The right ventricular size is mildly enlarged.  3. Left atrial size was mildly dilated.  4. Right atrial size was mildly dilated.  5. The mitral valve is normal in structure. Mild mitral valve regurgitation. No evidence of mitral stenosis.  6. The aortic valve is tricuspid. Aortic valve regurgitation is not visualized. No aortic stenosis is present.  7. The inferior vena cava is dilated in size with >50% respiratory variability, suggesting right atrial pressure of 8 mmHg. FINDINGS  Left Ventricle: Left ventricular ejection fraction, by estimation, is <20%. The left ventricle has severely decreased function. The left ventricle demonstrates global hypokinesis. Definity contrast agent was given IV to delineate the left ventricular endocardial borders. The left ventricular internal cavity size was severely dilated. There is no left ventricular hypertrophy. Left ventricular diastolic parameters are consistent with  Grade III diastolic dysfunction (restrictive). Right Ventricle: The right ventricular size is mildly enlarged.Right ventricular systolic function is mildly reduced. Left Atrium: Left atrial size was mildly dilated. Right Atrium: Right atrial size was mildly dilated. Pericardium: There is no evidence of pericardial effusion. Mitral Valve: The mitral valve is normal in structure. Mild mitral valve regurgitation. No evidence of mitral valve stenosis.  Tricuspid Valve: The tricuspid valve is normal in structure. Tricuspid valve regurgitation is mild . No evidence of tricuspid stenosis. Aortic Valve: The aortic valve is tricuspid. Aortic valve regurgitation is not visualized. No aortic stenosis is present. Pulmonic Valve: The pulmonic valve was normal in structure. Pulmonic valve regurgitation is not visualized. No evidence of pulmonic stenosis. Aorta: The aortic root is normal in size and structure. Venous: The inferior vena cava is dilated in size with greater than 50% respiratory variability, suggesting right atrial pressure of 8 mmHg. IAS/Shunts: The interatrial septum was not well visualized.  LEFT VENTRICLE PLAX 2D LVIDd:         7.10 cm LVIDs:         5.90 cm LV PW:         0.90 cm LV IVS:        0.80 cm LVOT diam:     2.00 cm LV SV:         24 LV SV Index:   11 LVOT Area:     3.14 cm  RIGHT VENTRICLE             IVC RV Basal diam:  5.50 cm     IVC diam: 2.80 cm RV Mid diam:    4.10 cm RV S prime:     12.30 cm/s TAPSE (M-mode): 1.9 cm LEFT ATRIUM           Index       RIGHT ATRIUM           Index LA diam:      3.10 cm 1.40 cm/m  RA Area:     28.10 cm LA Vol (A4C): 82.0 ml 37.02 ml/m RA Volume:   101.00 ml 45.59 ml/m  AORTIC VALVE AV Area (Vmax):    1.82 cm AV Area (Vmean):   1.69 cm AV Area (VTI):     1.76 cm AV Vmax:           105.00 cm/s AV Vmean:          78.300 cm/s AV VTI:            0.135 m AV Peak Grad:      4.4 mmHg AV Mean Grad:      3.0 mmHg LVOT Vmax:         60.70 cm/s LVOT Vmean:        42.200  cm/s LVOT VTI:          0.076 m LVOT/AV VTI ratio: 0.56  AORTA Ao Root diam: 3.30 cm Ao Asc diam:  3.00 cm  SHUNTS Systemic VTI:  0.08 m Systemic Diam: 2.00 cm Kirk Ruths MD Electronically signed by Kirk Ruths MD Signature Date/Time: 04/30/2021/2:08:26 PM    Final      Medications:     Scheduled Medications:  apixaban  5 mg Oral BID   atorvastatin  10 mg Oral Daily   Chlorhexidine Gluconate Cloth  6 each Topical Daily   Chlorhexidine Gluconate Cloth  6 each Topical Q0600   digoxin  125 mcg Oral Daily   furosemide  80 mg Intravenous BID   insulin aspart  0-15 Units Subcutaneous Q4H   levothyroxine  100 mcg Oral QAC breakfast   mupirocin ointment  1 application Nasal BID   pantoprazole  40 mg Oral Daily   potassium chloride  40 mEq Oral Q4H   risperiDONE  0.5 mg Oral QHS   sodium chloride flush  10-40 mL Intracatheter Q12H    Infusions:  sodium chloride     milrinone 0.5 mcg/kg/min (05/01/21 1213)   norepinephrine (LEVOPHED) Adult infusion 4 mcg/min (05/01/21 1200)    PRN Medications: docusate sodium, guaiFENesin-dextromethorphan, levalbuterol, polyethylene glycol, sodium chloride flush    Assessment/Plan   Cardiogenic Shock -Had LHC 10/2020 with normal cors. -ECHO --EF < 15%.  Repeat ECHO this admit. ---> EF < 20%  - Blood CX-  NGTD - On milrinone 0.5 + NE 4. Co-ox 79% - Responding well to IV lasix, CVP 9.  - No bb/arb with shock. - Hold SGLT2i - Not a candidate for advanced therapies.   2. Sinus Tach - Suspect in the setting of low output heart failure. - TSH ok - No bb.   3. AKI - Renal function improving with inotropes. - Creatinine baseline 1.3-1.5 - peaked to 1.7. 1.2 today    4. Non Hodgkins Lymphoma - Completed 17/20 radiation treatments.   5. DMII - insulin per primary    6. Hypothyroidisim. -TSH ok -On levothyroxine.   7. H/O Suicidal Ideation   8. H/O PE - On eliquis 5 mg twice a day  9. Hypokalemia - K 2.9  - supp ordered     He is interested in hospice. Now DNR. Awaiting palliative care consult.   Length of Stay: 2  Charles Jester, PA-C  05/01/2021, 12:40 PM  Advanced Heart Failure Team Pager [phone] (M-F; 7a - 5p)  Please contact [facility] for night-coverage after hours (5p -7a ) and weekends on [email or web address]   Agree with above  He remains on NE 2 and milrinone 0.5. Co-ox 79%. CVP 9. Feels better but still with prominent tachycardia. Mild dyspnea. Says this is how it always goes. Fluid comes off then comes right back on   Wants to know when Hospice people are coming by  General:  Obese male sitting in chair  HEENT: normal Neck: supple. JVP elevated Carotids 2+ bilat; no bruits. No lymphadenopathy or thryomegaly appreciated. Cor: PMI nondisplaced. Regular tachy +s3 Lungs: clear Abdomen: obese soft, nontender, + distended. No hepatosplenomegaly. No bruits or masses. Good bowel sounds. Extremities: no cyanosis, clubbing, rash, 2-3+ edema Neuro: alert & orientedx3, cranial nerves grossly intact. moves all 4 extremities w/o difficulty. Affect pleasant  He has end-stage HF with severe biventricular dysfunction and recurrent admissions. I think hospice is really the only option for him. Would turn milrinone down to 0.25. Will increase lasix to facilitate diuresis prior to d/c.   D/w Palliative Team.   CRITICAL CARE Performed by: Glori Bickers  Total critical care time: 45 minutes  Critical care time was exclusive of separately billable procedures and treating other patients.  Critical care was necessary to treat or prevent imminent or life-threatening deterioration.  Critical care was time spent personally by me (independent of midlevel providers or residents) on the following activities: development of treatment plan with patient and/or surrogate as well as nursing, discussions with consultants, evaluation of patient's response to treatment, examination of patient, obtaining history from  patient or surrogate, ordering and performing treatments and interventions, ordering and review of laboratory studies, ordering and review of radiographic studies, pulse oximetry and re-evaluation of patient's condition.  Glori Bickers, MD  2:24 PM

## 2021-05-01 NOTE — Progress Notes (Addendum)
This chaplain responded to PMT consult for creating or updating the Pt. Advance Directive:  HCPOA and Living Will.   The chaplain understands the Pt. is educated on the role and purpose of an Forensic scientist.  The chaplain facilitated the gathering the of the notary and witnesses for the notarizing of the Pt. Advance Directive.  The Pt. named Charles [name] as his healthcare agent.  The chaplain gave the Pt. the original Advance Directive and one copy. A copy was scanned into the Pt. EMR.   The Pt. accepted the space for reflective listening offered by the chaplain.  The chaplain understands the Pt. faith is leading him to accepting "one day at a time."  The chaplain shared prayer with the Pt. This chaplain is available for F/U spiritual care as needed.

## 2021-05-01 NOTE — Consult Note (Signed)
Palliative Care Consult Note                                  Date: 05/01/2021   Patient Name: Charles [name]  DOB: 1966/03/07  MRN: [id number]  Age / Sex: 55 y.o., male  PCP: [name], MD Referring Physician: Candee Furbish, MD  Reason for Consultation: Disposition and Establishing goals of care  HPI/Patient Profile: 55 y.o. male  with past medical history of DM2, HTN, HFrEF (EF 20-25%) due to NICM, Hodgkin's lymphoma, and schizophrenia admitted on 04/29/2021 with cough and shortness of breath.   Patient recently admitted in Dec 3428 w/ acute systolic HF w/ EF 76-81%. Patient also had mediastinal adenopathy and underwent lymph node biopsy. February 2022, patient seen in outpatient heart failure clinic and was given 40 Lasix and digoxin 0.125. Was also seen by hematology in February 2022 and was diagnosed stage I nodular lymphocyte Hodgkin's lymphoma. Patient's last radiation therapy given one month ago at [facility]. Patient has had several admissions to hospital for suicidal ideations this year. Patient has also had several admissions for CHF exacerbations this year. On 5/16 patient admitted to [facility] w/ decompensated HFrEF and PE. Was sent home on Apixaban. Patient recently presented to ED on 6/18 with cough/SOB and ran out of his home oxygen. Was given robitussin for cough and O2 was set up for him at his facility and was discharged.   Patient states his cough/sob has been progressively worsening since 04/26/2021. He wears O2 at home intermittently when SOB. He has orthopnea and is unable to walk without getting SOB. Does not take inhalers and denies history of lung disease. He states he has been taking his medications at home. Patient states his BLE are swelling more than usual, but has not been checking his daily weights at home. Denies fever.   Presents to [facility] ER on 6/22. ED course: Patient sob, coughing, and RR in 30s. Placed on 4 liters [place]  with sats mid 90s. [name] also hypotensive and EKG shows normal sinus tach. Creatinine is 1.7. BNP 1,156.5. CXR shows cardiomegaly and pulmonary edema. Normal WBC, VBG, and Hgb. Covid and Flu negative. Cardiology consulted and feels not a candidate for advanced therapies.  Past Medical History:  Diagnosis Date   Arthritis    lower back   Bipolar 1 disorder ([place])    Cancer ([place])    Cardiomyopathy, nonischemic ([place])    no CAD 10/14/20 cath, EF 20-25% by 10/11/20 echo   CHF (congestive heart failure) ([place])    COVID 10/2020   Depression    DM2 (diabetes mellitus, type 2) ([place])    HTN (hypertension)    Myocardial infarction [facility])     Social History   Socioeconomic History   Marital status: Divorced    Spouse name: Not on file   Number of children: 1   Years of education: Not on file   Highest education level: Not on file  Occupational History   Not on file  Tobacco Use   Smoking status: Some Days    Packs/day: 0.25    Pack years: 0.00    Types: Cigarettes   Smokeless tobacco: Never   Tobacco comments:    occasionally  Vaping Use   Vaping Use: Never used  Substance and Sexual Activity   Alcohol use: Never   Drug use: Not Currently    Types: Cocaine   Sexual activity:  Not Currently  Other Topics Concern   Not on file  Social History Narrative   Not on file   Social Determinants of Health   Financial Resource Strain: Medium Risk   Difficulty of Paying Living Expenses: Somewhat hard  Food Insecurity: No Food Insecurity   Worried About Running Out of Food in the Last Year: Never true   Ran Out of Food in the Last Year: Never true  Transportation Needs: Unmet Transportation Needs   Lack of Transportation (Medical): Yes   Lack of Transportation (Non-Medical): Yes  Physical Activity: Insufficiently Active   Days of Exercise per Week: 7 days   Minutes of Exercise per Session: 10 min  Stress: No Stress Concern Present   Feeling of Stress : Only a little  Social  Connections: Not on file    Family History  Problem Relation Age of Onset   Hypertension Mother    Hyperlipidemia Mother    Stroke Mother    Cancer Mother    Hypertension Father    Hyperlipidemia Father    Healthy Sister     Subjective:   This NP Walden Field and Lexine Baton, NP reviewed medical records, received report from team, assessed the patient and then meet at the patient's bedside  to discuss diagnosis, prognosis, GOC, EOL wishes disposition and options.   Concept of Palliative Care was introduced as specialized medical care for people and their families living with serious illness.  If focuses on providing relief from the symptoms and stress of a serious illness.  The goal is to improve quality of life for both the patient and the family. Values and goals of care important to patient and family were attempted to be elicited.  Created space and opportunity for patient  and family to explore thoughts and feelings regarding current medical situation   Natural trajectory and expectations at EOL were discussed. Questions and concerns addressed. Patient  encouraged to call with questions or concerns.    Life Review: The patient is originally from [facility] [place] [facility]. Was living in [place] until a few years ago when he and his fiancee moved back to [place]. He has a difficult relationship with his family (brother, sister, daughter, grandkids) and hasn't spon with them much since moving back to [facility]. His primary support is his fiancee Remonia Richter and her 3 children.  He enjoys spending time with his family, watching crime TV (such as Mining engineer, Proofreader, etc), and sports. His is a [place] [employer] and [place] and a Fish farm manager for college sports.  He currently lives at [place]  Patient Values: Family, comfort, quality  Patient/Family Understanding of Illness: He understands his heart is very weak and there's not much they can do for him. He was told he has a few  months up to a year to live. This is very hard for him. His fiancee states this hurts a lot, like "someone is ripping my guts out, after being with someone for so long to know you're going to lose them soon"; hearing this caused the patient to tear up. They volunteered they are interested in hospice and asked for more information about this.  He states his pattern recently has been d/c from the hospical and end up back in a week. This is exhausting for him and doesn't like it.  Review of Systems  Constitutional:  Positive for fatigue.  Respiratory:  Positive for cough and shortness of breath.   Cardiovascular:  Negative for  chest pain.   Objective:   Primary Diagnoses: Present on Admission:  Acute on chronic heart failure with reduced ejection fraction and diastolic dysfunction (HCC)  Hypertension  MDD (major depressive disorder), recurrent, severe, with psychosis (HCC)  Shortness of breath   Scheduled Meds:  apixaban  5 mg Oral BID   atorvastatin  10 mg Oral Daily   Chlorhexidine Gluconate Cloth  6 each Topical Daily   Chlorhexidine Gluconate Cloth  6 each Topical Q0600   digoxin  125 mcg Oral Daily   furosemide  80 mg Intravenous TID   insulin aspart  0-15 Units Subcutaneous Q4H   levothyroxine  100 mcg Oral QAC breakfast   mupirocin ointment  1 application Nasal BID   pantoprazole  40 mg Oral Daily   risperiDONE  0.5 mg Oral QHS   sodium chloride flush  10-40 mL Intracatheter Q12H    Continuous Infusions:  sodium chloride     milrinone 0.25 mcg/kg/min (05/01/21 1423)   norepinephrine (LEVOPHED) Adult infusion 2 mcg/min (05/01/21 1400)    PRN Meds: docusate sodium, guaiFENesin-dextromethorphan, levalbuterol, polyethylene glycol, sodium chloride flush  Allergies  Allergen Reactions   Ibuprofen Other (See Comments)    Unknown per MAR   Penicillins Rash    Physical Exam Constitutional:      General: He is not in acute distress.    Appearance: He is well-developed.  He is obese. He is not toxic-appearing.  Cardiovascular:     Rate and Rhythm: Tachycardia present.  Pulmonary:     Effort: Tachypnea present. No respiratory distress.     Comments: Cough noted Skin:    General: Skin is warm and dry.  Neurological:     General: No focal deficit present.     Mental Status: He is alert.  Psychiatric:        Mood and Affect: Mood normal. Affect is tearful.        Behavior: Behavior normal.    Vital Signs:  BP 98/75   Pulse (!) 128   Temp 98 F (36.7 C) (Oral)   Resp (!) 36   Ht 5\' 9"  (1.753 m)   Wt 106.3 kg   SpO2 96%   BMI 34.61 kg/m  Pain Scale: 0-10   Pain Score: 0-No pain  SpO2: SpO2: 96 % O2 Device:SpO2: 96 % O2 Flow Rate: .O2 Flow Rate (L/min): 2 L/min  IO: Intake/output summary:  Intake/Output Summary (Last 24 hours) at 05/01/2021 1553 Last data filed at 05/01/2021 1400 Gross per 24 hour  Intake 1727.84 ml  Output 5132 ml  Net -3404.16 ml    LBM: Last BM Date: 05/01/21 Baseline Weight: Weight: 99.8 kg Most recent weight: Weight: 106.3 kg      Palliative Assessment/Data: 50%    Clinical Assessment and Goals of Care: Patient with end stage heart failure (EF 15%) with no candidacy for advanced therapies. Frequent hospitalizations. Understands he is near end of life. He values quality, comfort, time with his fiancee.  Advanced Care Planning:   Primary Decision Maker: PATIENT  Code Status/Advance Care Planning: DNR  A discussion was had today regarding advanced directives. Concepts specific to code status, artifical feeding and hydration, continued IV antibiotics and rehospitalization was had.  The difference between a aggressive medical intervention path and a palliative comfort care path for this patient at this time was had. The MOST form was introduced and discussed. We completed the MOST form with the patient (see [place]yn[place]) as well as advanced directives/HCPOA and DNR form.  Decisions/Changes  to ACP: Comfort focus with  home hospice [facility] consult entered) Avoid rehospitalization No tube feeds No IVF (CHF) Antibitoics on case by case basis  Assessment & Plan:   I have reviewed the medical record, interviewed the patient and family, and examined the patient. The following aspects are pertinent.  Impression: Advanced heart failure near end of life. [name] hospice appropriate and is interested in home (ALF) hospice. Values time with family (fiancee and her children), comfort, dignity, quality. Wants to avoid frequent hospitalizations, unless symptoms are unable to be managed at home with hospice.  SUMMARY OF RECOMMENDATIONS   Home with hospice Avoid rehospitalization (unless for uncontrolled symptoms) DNR No TF, no IVF  Symptom Management:  Dyspnea management No pain at this time Anxiety management  Palliative Prophylaxis:  Frequent Pain Assessment and dyspnea management  Additional Recommendations (Limitations, Scope, Preferences): Avoid Hospitalization, No Artificial Feeding, and No IV Fluids  Psycho-social/Spiritual:  Desire for further Chaplaincy support: yes Additional Recommendations: Caregiving  Support/Resources, Education on Hospice, and Grief/Bereavement Support  Prognosis:  < 6 months  Discharge Planning:  [place] with hospice     Thank you for allowing [place] to participate in the care of REGIE BUNNER PMT will continue to support holistically.  Time In: 2:15 Time Out: 4:15 Time Total: 120 minutes  Greater than 50%  of this time was spent counseling and coordinating care related to the above assessment and plan.  Signed by: Walden Field, DNP, AGNP-C Palliative Medicine Team  Team Phone # [phone] (Nights/Weekends)  05/01/2021, 3:53 PM

## 2021-05-01 NOTE — Progress Notes (Addendum)
NAME:  Charles [name], MRN:  [id number], DOB:  Feb 03, 1966, LOS: 2 ADMISSION DATE:  04/29/2021, CONSULTATION DATE:  6/22 REFERRING MD:  Dr. Roslynn Amble, CHIEF COMPLAINT:  SOB/cough   History of Present Illness:  [name] a 55 yo M w/ pertinent PMH of DM2, HTN, HFrEF (EF 20-25%) due to NICM, Hodgkin's lymphoma, and schizophrenia presents to [facility] on 6/22 w/ cough and SOB.  Patient recently admitted in Dec 0623 w/ acute systolic HF w/ EF 76-28%. Patient also had mediastinal adenopathy and underwent lymph node biopsy. February 2022, patient seen in outpatient heart failure clinic and was given 40 Lasix and digoxin 0.125. Was also seen by hematology in February 2022 and was diagnosed stage I nodular lymphocyte Hodgkin's lymphoma. Patient's last radiation therapy given one month ago at [facility]. Patient has had several admissions to hospital for suicidal ideations this year. Patient has also had several admissions for CHF exacerbations this year. On 5/16 patient admitted to [facility] w/ decompensated HFrEF and PE. Was sent home on Apixaban. Patient recently presented to ED on 6/18 with cough/SOB and ran out of his home oxygen. Was given robitussin for cough and O2 was set up for him at his facility and was discharged.  Patient states his cough/sob has been progressively worsening since 04/26/2021. He wears O2 at home intermittently when SOB. He has orthopnea and is unable to walk without getting SOB. Does not take inhalers and denies history of lung disease. He states he has been taking his medications at home. Patient states his BLE are swelling more than usual, but has not been checking his daily weights at home. Denies fever.  Presents to [facility] ER on 6/22. ED course: Patient sob, coughing, and RR in 30s. Placed on 4 liters [place] with sats mid 90s. [name] also hypotensive and EKG shows normal sinus tach. Creatinine is 1.7. BNP 1,156.5. CXR shows cardiomegaly and pulmonary edema. Normal WBC, VBG, and Hgb. Covid and Flu  negative. Cardiology consulted.  PCCM consulted for admission to ICU, management of cardiogenic shock and respiratory management.  Pertinent  Medical History    has a past medical history of Arthritis, Bipolar 1 disorder ([place]), Cancer ([place]), Cardiomyopathy, nonischemic ([place]), CHF (congestive heart failure) ([place]), COVID (10/2020), Depression, DM2 (diabetes mellitus, type 2) ([place]), HTN (hypertension), and Myocardial infarction ([place]).   Significant Hospital Events: Including procedures, antibiotic start and stop dates in addition to other pertinent events   6/22: Patient admitted to [facility] 3/15 for acute systolic CHF exacerbation and cardiogenic shock. Placed on BiPAP. Milrinone and Lasix started 6/23 Off bipap, CI and CO improved, on Milrinone and not requiring pressors  Interim History / Subjective:  Feels a bit better today, making urine. Remains on levophed and milrinone.  Objective   Blood pressure 100/80, pulse (!) 119, temperature 98 F (36.7 C), temperature source Oral, resp. rate (!) 35, height 5\' 9"  (1.753 m), weight 106.3 kg, SpO2 96 %. CVP:  [3 mmHg-19 mmHg] 11 mmHg      Intake/Output Summary (Last 24 hours) at 05/01/2021 1154 Last data filed at 05/01/2021 1000 Gross per 24 hour  Intake 1633 ml  Output 2883 ml  Net -1250 ml    Filed Weights   04/29/21 2225 04/29/21 2250 05/01/21 0500  Weight: 107.7 kg 106.9 kg 106.3 kg   Constitutional: no acute distress  Eyes: EOMI, pupils equal Ears, nose, mouth, and throat: MMM, trachea midline Cardiovascular: tachy, ext warm Respiratory: diminished at bases with crackles, no accessory muscle use Gastrointestinal:  soft, +BS Skin: No rashes, normal turgor Neurologic: moves all 4 ext to command Psychiatric: RASS 0  CVP 11  Labs/imaging that I havepersonally reviewed  (right click and "Reselect all SmartList Selections" daily)  Echo worsened EF [place] okay Cr improved K/Mg repleted  Resolved Hospital Problem list      Assessment & Plan:   Acute hypoxic respiratory failure secondary to acute volume overload and Cardiogenic shock superimposed on chronic HFrEF- improved  Hx PE- remains on AC Cardiorenal syndrome improved Hx of MDD and schizophrenia- continued on home risperdal Hx of HTN, HLD- continues on home statin, BP meds on hold due to hypotension Hx of Hypothyroidism- continues on home synthroid Hx of BPH- home synthroid on hold due to hypotension DM2- A1c 7.3%, on SSI here 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- on radiation therapy as OP  - Titrate levophed to MAP 65 - Milrinone and lasix titration per CHF team - IS, progressive mobility - Aggressive electrolyte replacement - Palliative consult pending, he may be interested in hospice as there are no real good long term treatment options for his degree of heart failure  Patient critically ill due to cardiogenic shock Interventions to address this today levophed titration Risk of deterioration without these interventions is high  I personally spent 32 minutes providing critical care not including any separately billable procedures  Erskine Emery MD [facility]  Prefer epic messenger for cross cover needs If after hours, please call E-link

## 2021-05-01 NOTE — TOC Initial Note (Addendum)
Transition of Care (TOC) - Initial/Assessment Note  Heart Failure   Patient Details  Name: Charles [name] MRN: [id number] Date of Birth: Oct 15, 1966  Transition of Care [facility]) CM/SW Contact:    Charles [name], [place] Phone Number: 05/01/2021, 12:14 PM  Clinical Narrative:                 CSW spoke with the patient at bedside and completed very brief SDOH screening with the patient who reported having some social needs including transportation. Patient reported he already receives Charles [name] and disability and that he does have a PCP who he hasn't seen in sometime but is willing to continue receiving care with Dr. Clinton [name]. Mr. Charles [name] reported he is able to get his medications. CSW will arrange for transportation for the patient to [facility] related appointments and gave him a card with a number to call for transportation as needed. CSW will follow up with Mr. Charles [name] again as he progresses.  3:35pm - CSW received a secure chat from palliative regarding discussion with the patient and MD for disposition home ALF with hospice. CSW reached out to the patients ALF Charles [name] however nobody answered the phone and CSW left a voicemail for them to return the call.  4:12pm - CSW spoke with Mr. Charles [name] to see about his understanding regarding hospice care and preference and patient reported no preference and asked when he will discharge and CSW informed the patient that will be up to the doctor to decide.  4:16pm - CSW reached out to the hospital liaison with hospice via palliative consult for home ALF with hospice and spoke with Charles [name] to initiate home hospice services.  CSW will continue to follow throughout discharge.    Barriers to Discharge: Continued Medical Work up   Patient Goals and CMS Choice        Expected Discharge Plan and Services                                                Prior Living Arrangements/Services     Patient language and need for interpreter  reviewed:: Yes        Need for Family Participation in Patient Care: No (Comment) Care giver support system in place?: No (comment)   Criminal Activity/Legal Involvement Pertinent to Current Situation/Hospitalization: No - Comment as needed  Activities of Daily Living Home Assistive Devices/Equipment: Oxygen ADL Screening (condition at time of admission) Patient's cognitive ability adequate to safely complete daily activities?: Yes Is the patient deaf or have difficulty hearing?: No Does the patient have difficulty seeing, even when wearing glasses/contacts?: No Does the patient have difficulty concentrating, remembering, or making decisions?: No Patient able to express need for assistance with ADLs?: Yes Does the patient have difficulty dressing or bathing?: Yes Independently performs ADLs?: No Communication: Independent Dressing (OT): Independent Grooming: Independent Feeding: Independent Bathing: Needs assistance Is this a change from baseline?: Pre-admission baseline Toileting: Independent In/Out Bed: Independent Walks in Home: Independent with device (comment) (rollator) Does the patient have difficulty walking or climbing stairs?: Yes Weakness of Legs: None Weakness of Arms/Hands: None  Permission Sought/Granted                  Emotional Assessment Appearance:: Appears stated age Attitude/Demeanor/Rapport: Engaged Affect (typically observed): Pleasant Orientation: : Oriented to Self, Oriented to Place, Oriented to  Time, Oriented to Situation   Psych Involvement: No (comment)  Admission diagnosis:  Cardiogenic shock ([place]) [R57.0] Patient Active Problem List   Diagnosis Date Noted   Cardiogenic shock ([place]) 04/29/2021   Cardiorenal syndrome with renal failure 04/29/2021   Homelessness 03/21/2021   Suicidal ideations 03/20/2021   MDD (major depressive disorder), recurrent, severe, with psychosis ([place]) 02/28/2021   Acute hypoxemic respiratory failure ([place])  01/13/2021   Acute on chronic heart failure with reduced ejection fraction and diastolic dysfunction ([place]) 01/12/2021   Schizophrenia ([place]) 01/04/2021   Diarrhea 01/04/2021   Pulmonary edema    Mediastinal adenopathy 12/18/2020   Adjustment disorder with mixed anxiety and depressed mood 10/30/2020   Mediastinal lymphadenopathy 10/11/2020   Shortness of breath 10/11/2020   Sinus tachycardia 10/11/2020   Night sweats 10/11/2020   DM2 (diabetes mellitus, type 2) ([place]) 10/11/2020   Hodgkin lymphoma ([place]) 10/11/2020   Hypertension 09/25/2020   BPH (benign prostatic hyperplasia) 09/25/2020   Insomnia 09/25/2020   PCP:  Charles Axe, MD Pharmacy:   [facility], [address] [place] [address] Phone: [phone] Fax: [phone]     Social Determinants of Health (SDOH) Interventions Food Insecurity Interventions: Intervention Not Indicated, Other (Comment) (Pt. reports getting food stamps already) Financial Strain Interventions: Intervention Not Indicated Housing Interventions: Intervention Not Indicated Transportation Interventions: Financial planner (facility provides transportation)  Readmission Risk Interventions Readmission Risk Prevention Plan 01/06/2021  Medication Review (RN Care Manager) Complete  HRI or [place] Complete  SW Recovery Care/Counseling Consult Complete  [place] Not Applicable  Some recent data might be hidden   Charles [name], MSW, LCSWA [phone] Heart Failure Social Worker

## 2021-05-01 NOTE — Progress Notes (Signed)
Manufacturing engineer [facility]) Hospital Liaison: RN note    Notified by Transition of [place], CSW of patient/family request for [facility] services at [place] after discharge. Chart and patient information under review by [facility] physician. Hospice eligibility pending currently.    Writer spoke with patient to initiate education related to hospice philosophy, services and team approach to care. Patient verbalized understanding of information given.  Please send signed and completed DNR form home with patient/family. Patient will need prescriptions for discharge comfort medications.     DME needs have been discussed, patient currently has the following equipment in the home: oxygen and walker.  Patient/family requests the following DME for delivery to the home: none. [facility] aware of the above.  Please notify [facility] when patient is ready to leave the unit at discharge. (Call [phone] or [phone] after 5pm.) [facility] information and contact numbers given to patient.      A Please do not hesitate to call with questions.    Thank you,   Farrel Gordon, RN, [place] (listed on [facility] under [place])    [phone]

## 2021-05-02 LAB — COOXEMETRY PANEL
Carboxyhemoglobin: 1.1 % (ref 0.5–1.5)
Methemoglobin: 0.6 % (ref 0.0–1.5)
O2 Saturation: 64.2 %
Total hemoglobin: 13.8 g/dL (ref 12.0–16.0)

## 2021-05-02 LAB — BASIC METABOLIC PANEL
Anion gap: 6 (ref 5–15)
BUN: 16 mg/dL (ref 6–20)
CO2: 30 mmol/L (ref 22–32)
Calcium: 8.4 mg/dL — ABNORMAL LOW (ref 8.9–10.3)
Chloride: 98 mmol/L (ref 98–111)
Creatinine, Ser: 1.04 mg/dL (ref 0.61–1.24)
GFR, Estimated: >60 mL/min (ref >60)
Glucose, Bld: 115 mg/dL — ABNORMAL HIGH (ref 70–99)
Potassium: 3.7 mmol/L (ref 3.5–5.1)
Sodium: 134 mmol/L — ABNORMAL LOW (ref 135–145)

## 2021-05-02 LAB — GLUCOSE, CAPILLARY
Glucose-Capillary: 116 mg/dL — ABNORMAL HIGH (ref 70–99)
Glucose-Capillary: 111 mg/dL — ABNORMAL HIGH (ref 70–99)
Glucose-Capillary: 124 mg/dL — ABNORMAL HIGH (ref 70–99)
Glucose-Capillary: 129 mg/dL — ABNORMAL HIGH (ref 70–99)
Glucose-Capillary: 135 mg/dL — ABNORMAL HIGH (ref 70–99)
Glucose-Capillary: 128 mg/dL — ABNORMAL HIGH (ref 70–99)

## 2021-05-02 LAB — MAGNESIUM: Magnesium: 2.1 mg/dL (ref 1.7–2.4)

## 2021-05-02 MED ORDER — MIDODRINE HCL 5 MG PO TABS
5.0000 mg | ORAL_TABLET | Freq: Three times a day (TID) | ORAL | Status: DC
  Administered 2021-05-02 (×2): 5 mg via ORAL
  Filled 2021-05-02 (×2): qty 1

## 2021-05-02 MED ORDER — MORPHINE SULFATE 15 MG PO TABS
15.0000 mg | ORAL_TABLET | ORAL | Status: DC | PRN
  Administered 2021-05-02: 15 mg via ORAL
  Filled 2021-05-02: qty 1

## 2021-05-02 MED ORDER — POTASSIUM CHLORIDE CRYS ER 20 MEQ PO TBCR
40.0000 meq | EXTENDED_RELEASE_TABLET | ORAL | Status: AC
  Administered 2021-05-02 (×2): 40 meq via ORAL
  Filled 2021-05-02 (×2): qty 2

## 2021-05-02 MED ORDER — GABAPENTIN 100 MG PO CAPS
100.0000 mg | ORAL_CAPSULE | Freq: Every day | ORAL | Status: DC
  Administered 2021-05-02 – 2021-05-03 (×2): 100 mg via ORAL
  Filled 2021-05-02 (×2): qty 1

## 2021-05-02 NOTE — Progress Notes (Signed)
Daily Progress Note   Patient Name: Charles [name]       Date: 05/02/2021 DOB: 04-11-1966  Age: 55 y.o. MRN#: [id number] Attending Physician: Candee Furbish, MD Primary Care Physician: [name], MD Admit Date: 04/29/2021 Length of Stay: 3 days  Reason for Consultation/Follow-up: Disposition and Establishing goals of care  HPI/Patient Profile: 55 y.o. male  with past medical history of DM2, HTN, HFrEF (EF 20-25%) due to NICM, Hodgkin's lymphoma, and schizophrenia admitted on 04/29/2021 with cough and shortness of breath.    Patient recently admitted in Dec 9509 w/ acute systolic HF w/ EF 32-67%. Patient also had mediastinal adenopathy and underwent lymph node biopsy. February 2022, patient seen in outpatient heart failure clinic and was given 40 Lasix and digoxin 0.125. Was also seen by hematology in February 2022 and was diagnosed stage I nodular lymphocyte Hodgkin's lymphoma. Patient's last radiation therapy given one month ago at [facility]. Patient has had several admissions to hospital for suicidal ideations this year. Patient has also had several admissions for CHF exacerbations this year. On 5/16 patient admitted to [facility] w/ decompensated HFrEF and PE. Was sent home on Apixaban. Patient recently presented to ED on 6/18 with cough/SOB and ran out of his home oxygen. Was given robitussin for cough and O2 was set up for him at his facility and was discharged.   Patient states his cough/sob has been progressively worsening since 04/26/2021. He wears O2 at home intermittently when SOB. He has orthopnea and is unable to walk without getting SOB. Does not take inhalers and denies history of lung disease. He states he has been taking his medications at home. Patient states his BLE are swelling more than usual, but has not been checking his daily weights at home. Denies fever.   Presents to [facility] ER on 6/22. ED course: Patient sob, coughing, and RR in 30s. Placed on 4 liters [place] with sats mid 90s. Patient  is also hypotensive and EKG shows normal sinus tach. Creatinine is 1.7. BNP 1,156.5. CXR shows cardiomegaly and pulmonary edema. Normal WBC, VBG, and Hgb. Covid and Flu negative. Cardiology consulted and feels not a candidate for advanced therapies.  Current Medications: Scheduled Meds:   apixaban  5 mg Oral BID   atorvastatin  10 mg Oral Daily   Chlorhexidine Gluconate Cloth  6 each Topical Daily   Chlorhexidine Gluconate Cloth  6 each Topical Q0600   digoxin  125 mcg Oral Daily   furosemide  80 mg Intravenous TID   insulin aspart  0-15 Units Subcutaneous Q4H   levothyroxine  100 mcg Oral QAC breakfast   mouth rinse  15 mL Mouth Rinse BID   midodrine  5 mg Oral TID WC   mupirocin ointment  1 application Nasal BID   pantoprazole  40 mg Oral Daily   risperiDONE  0.5 mg Oral QHS   sodium chloride flush  10-40 mL Intracatheter Q12H    Continuous Infusions:  sodium chloride Stopped (05/02/21 0850)   milrinone 0.25 mcg/kg/min (05/02/21 1201)   norepinephrine (LEVOPHED) Adult infusion Stopped (05/02/21 0727)    PRN Meds: docusate sodium, guaiFENesin-dextromethorphan, levalbuterol, morphine, polyethylene glycol, sodium chloride flush  Subjective:   Subjective: Chart Reviewed. Updates received. Patient Assessed. Created space and opportunity for patient  and family to explore thoughts and feelings regarding current medical situation.  As into the room the patient is sitting up in the bed.  He appears comfortable.  He was ordering breakfast and we gave him time to  do this.  He denies any pain or dyspnea today.  He still has a chronic cough and is receiving the cough medicine which he feels he helps.  He also feels chlorhexidine mouth rinse helps keep his mucous membranes moist and comfortable.  Denies any abdominal pain.  He is having good bowel movements and actually had 2 today.  He denies any further complaints.  Review of Systems  Constitutional:  Positive for fatigue. Negative for  appetite change.  Respiratory:  Positive for cough. Negative for chest tightness and shortness of breath.   Cardiovascular:  Negative for chest pain and leg swelling.  Gastrointestinal:  Negative for abdominal pain.   Objective:   Vital Signs: BP 103/82   Pulse (!) 135   Temp 97.6 F (36.4 C) (Axillary)   Resp (!) 28   Ht 5\' 9"  (1.753 m)   Wt 102.6 kg   SpO2 100%   BMI 33.40 kg/m  SpO2: SpO2: 100 % O2 Device: O2 Device: Nasal Cannula O2 Flow Rate: O2 Flow Rate (L/min): 2 L/min  Physical Exam: Physical Exam Constitutional:      General: He is not in acute distress.    Appearance: He is obese. He is not toxic-appearing.  HENT:     Head: Normocephalic and atraumatic.  Cardiovascular:     Rate and Rhythm: Regular rhythm. Tachycardia present.  Pulmonary:     Effort: Pulmonary effort is normal. No tachypnea, accessory muscle usage or respiratory distress.     Breath sounds: Normal breath sounds.  Abdominal:     Palpations: Abdomen is soft.     Tenderness: There is no abdominal tenderness.  Musculoskeletal:     Right lower leg: No edema.     Left lower leg: No edema.  Neurological:     General: No focal deficit present.     Mental Status: He is alert.  Psychiatric:        Mood and Affect: Mood normal. Mood is not anxious.        Behavior: Behavior normal. Behavior is not agitated.    SpO2: SpO2: 100 % O2 Device:SpO2: 100 % O2 Flow Rate: .O2 Flow Rate (L/min): 2 L/min  IO: Intake/output summary:  Intake/Output Summary (Last 24 hours) at 05/02/2021 1342 Last data filed at 05/02/2021 1200 Gross per 24 hour  Intake 1218.08 ml  Output 4030 ml  Net -2811.92 ml    LBM: Last BM Date: 05/02/21 Baseline Weight: Weight: 99.8 kg Most recent weight: Weight: 102.6 kg   Palliative Assessment/Data:   Assessment & Plan:   Impression:  Advanced heart failure near end of life. [name] hospice appropriate and is interested in home (ALF) hospice. TOC was consulted yesterday  and plans are in motion for d/c to ALF on hospice (possibly Monday). Values time with family (fiancee and her children), comfort, dignity, quality. Wants to avoid frequent hospitalizations, unless symptoms are unable to be managed at home with hospice.  SUMMARY OF RECOMMENDATIONS   DNR MOST form on the chart Advanced directives on the chart Continue to treat the treatable Focus on comfort Plan d/c to hospice at ALF  Code Status: DNR  Goals of Care/Recommendations: Comfort, dignity, time with family Avoid rehospitalization unless unable to manage symptoms Plan home with hospice to ALF Symptom Management: Pain management per attending Continue cough medications Can manage anxiety as needed (none at this time) Due to complaints of neuropathic pain bilat feet will start Neurontin 100 mg qhs, tolerate as needed  Prognosis: < 6 months  Discharge Planning:  ALF with hospice  Thank you for allowing the Palliative Medicine Team to assist in the care of this patient.  Time Total: 35 minutes  Visit consisted of counseling and education dealing with the complex and emotionally intense issues of symptom management and palliative care in the setting of serious and potentially life-threatening illness.Greater than 50%  of this time was spent counseling and coordinating care related to the above assessment and plan.  Walden Field, [facility] Palliative Medicine Team  Team Phone # [phone] (Nights/Weekends)  05/02/2021, 1:42 PM

## 2021-05-02 NOTE — Evaluation (Signed)
Physical Therapy Evaluation Patient Details Name: Charles [name] MRN: [id number] DOB: [birth year] Today's Date: 05/02/2021   History of Present Illness  55 yo M presented to [facility] on 6/22 w/ cough and SOB, hypontensive, tachycardic, placed on BiPAP and pressors. Palliative consult for GOC with pt on discharger to return to ALF with Hospice services, DNR.   PMH of DM2, HTN, HFrEF (EF 20-25%) due to NICM, Hodgkin's lymphoma, and schizophrenia, several admissions to hospital for suicidal ideations this year  Clinical Impression   Pt admitted secondary to problem above with deficits below. PTA patient was ambulating independently in his room and to dining room, but admits it has been getting more difficult as he continues to get weaker.  Pt currently requires minguard assist for short distance ambulation (pt was fatigued after sitting up x 6 hours and assisted back to bed).  Anticipate patient will benefit from acute PT to address problems listed below.Will continue to follow acutely to maximize functional mobility independence and safety. Noted he will be followed by Hospice on return to ALF.     Follow Up Recommendations No PT follow up (plans to return to ALF with Hospice)    Equipment Recommendations  Wheelchair (measurements PT);Wheelchair cushion (measurements PT)    Recommendations for Other Services       Precautions / Restrictions Precautions Precautions: Fall      Mobility  Bed Mobility Overal bed mobility: Needs Assistance Bed Mobility: Sit to Supine       Sit to supine: Supervision   General bed mobility comments: for safety with lines    Transfers Overall transfer level: Needs assistance Equipment used: None Transfers: Sit to/from Stand;Stand Pivot Transfers Sit to Stand: Min guard Stand pivot transfers: Min guard;+2 safety/equipment       General transfer comment: pt/RN reporting up in chair x 6 hrs and pt reports too fatigued to walk; assisted chair to bed with  minguard for safety and lines  Ambulation/Gait Ambulation/Gait assistance: Min guard Gait Distance (Feet): 5 Feet Assistive device: None Gait Pattern/deviations: Step-to pattern     General Gait Details: chair to bed  Stairs            Wheelchair Mobility    Modified Rankin (Stroke Patients Only)       Balance Overall balance assessment: No apparent balance deficits (not formally assessed)                                           Pertinent Vitals/Pain Pain Assessment: No/denies pain    Home Living Family/patient expects to be discharged to:: Assisted living               Home Equipment: Walker - 4 wheels;Shower seat      Prior Function Level of Independence: Independent with assistive device(s)         Comments: reports walking to dining room has gotten difficult and requesting wheelchair     Hand Dominance   Dominant Hand: Right    Extremity/Trunk Assessment   Upper Extremity Assessment Upper Extremity Assessment: Generalized weakness    Lower Extremity Assessment Lower Extremity Assessment: Generalized weakness    Cervical / Trunk Assessment Cervical / Trunk Assessment: Other exceptions Cervical / Trunk Exceptions: anasarca  Communication   Communication: No difficulties  Cognition Arousal/Alertness: Awake/alert Behavior During Therapy: Flat affect Overall Cognitive Status: Within Functional Limits for tasks assessed  General Comments: not specifically tested; [facility] conversationally and with history provided      General Comments General comments (skin integrity, edema, etc.): Pt requesting PT return later after his nap so that he can go for a walk    Exercises General Exercises - Lower Extremity Toe Raises: AROM;Both;10 reps Heel Raises: AROM;10 reps   Assessment/Plan    PT Assessment Patient needs continued PT services  PT Problem List Decreased activity  tolerance;Decreased mobility;Decreased knowledge of use of DME;Cardiopulmonary status limiting activity       PT Treatment Interventions DME instruction;Gait training;Functional mobility training;Therapeutic activities;Patient/family education    PT Goals (Current goals can be found in the Care Plan section)  Acute Rehab PT Goals Patient Stated Goal: to walk in his room at ALF; use w/c to get to dining room PT Goal Formulation: With patient Time For Goal Achievement: 05/16/21 Potential to Achieve Goals: Good    Frequency Min 3X/week   Barriers to discharge        Co-evaluation               AM-PAC PT "6 Clicks" Mobility  Outcome Measure Help needed turning from your back to your side while in a flat bed without using bedrails?: None Help needed moving from lying on your back to sitting on the side of a flat bed without using bedrails?: None Help needed moving to and from a bed to a chair (including a wheelchair)?: A Little Help needed standing up from a chair using your arms (e.g., wheelchair or bedside chair)?: A Little Help needed to walk in hospital room?: A Little Help needed climbing 3-5 steps with a railing? : Total 6 Click Score: 18    End of Session Equipment Utilized During Treatment: Oxygen Activity Tolerance: Treatment limited secondary to medical complications (Comment) (limited by elevated HR 129 bpm) Patient left: in bed;with call bell/phone within reach;with bed alarm set Nurse Communication: Mobility status PT Visit Diagnosis: Difficulty in walking, not elsewhere classified (R26.2)    Time: 3212-2482 PT Time Calculation (min) (ACUTE ONLY): 12 min   Charges:   PT Evaluation $PT Eval Low Complexity: 1 Low           Arby Barrette, PT Pager [phone]   Rexanne Mano 05/02/2021, 2:28 PM

## 2021-05-02 NOTE — Progress Notes (Signed)
Physical Therapy Treatment and Discharge Patient Details Name: Charles [name] MRN: [id number] DOB: 12-Aug-1966 Today's Date: 05/02/2021    History of Present Illness 55 yo M presented to [facility] on 6/22 w/ cough and SOB, hypontensive, tachycardic, placed on BiPAP and pressors. Palliative consult for GOC with pt on discharger to return to ALF with Hospice services, DNR.   PMH of DM2, HTN, HFrEF (EF 20-25%) due to NICM, Hodgkin's lymphoma, and schizophrenia, several admissions to hospital for suicidal ideations this year    PT Comments    Patient earlier too fatigued to ambulate and asked PT to return, if possible. Patient eager to walk and did very well on 2L with sats 98% and HR 133-139 (has been 120s-130s at rest today). All goals met and no further PT indicated.     Follow Up Recommendations  No PT follow up (plans to return to ALF with Hospice)     Equipment Recommendations  Wheelchair (measurements PT);Wheelchair cushion (measurements PT)    Recommendations for Other Services       Precautions / Restrictions Precautions Precautions: Fall    Mobility  Bed Mobility Overal bed mobility: Needs Assistance Bed Mobility: Sit to Supine;Supine to Sit     Supine to sit: Supervision Sit to supine: Supervision   General bed mobility comments: for safety with lines    Transfers Overall transfer level: Needs assistance Equipment used: None Transfers: Sit to/from Stand Sit to Stand: Supervision Stand pivot transfers: Min guard;+2 safety/equipment       General transfer comment: from EOB; supervision for lines/safety  Ambulation/Gait Ambulation/Gait assistance: Min guard Gait Distance (Feet): 180 Feet Assistive device: IV Pole Gait Pattern/deviations: WFL(Within Functional Limits) Gait velocity: WNL   General Gait Details: in hallway on 2L with HR 133-139 (has been tachycardic 120s-130s all day per RN); no imbalance or dyspnea   Stairs             Wheelchair Mobility     Modified Rankin (Stroke Patients Only)       Balance Overall balance assessment: No apparent balance deficits (not formally assessed)                                          Cognition Arousal/Alertness: Awake/alert Behavior During Therapy: Flat affect Overall Cognitive Status: Within Functional Limits for tasks assessed                                 General Comments: not specifically tested; [facility] conversationally and with history provided      Exercises General Exercises - Lower Extremity Toe Raises: AROM;Both;10 reps Heel Raises: AROM;10 reps    General Comments General comments (skin integrity, edema, etc.): Pt requesting PT return later after his nap so that he can go for a walk      Pertinent Vitals/Pain Pain Assessment: No/denies pain    Home Living Family/patient expects to be discharged to:: Assisted living             Home Equipment: Walker - 4 wheels;Shower seat      Prior Function Level of Independence: Independent with assistive device(s)      Comments: reports walking to dining room has gotten difficult and requesting wheelchair   PT Goals (current goals can now be found in the care plan section) Acute Rehab PT Goals Patient Stated  Goal: to walk in his room at ALF; use w/c to get to dining room PT Goal Formulation: With patient Time For Goal Achievement: 05/16/21 Potential to Achieve Goals: Good Progress towards PT goals: Goals met/education completed, patient discharged from PT    Frequency    Min 3X/week      PT Plan Current plan remains appropriate    Co-evaluation              AM-PAC PT "6 Clicks" Mobility   Outcome Measure  Help needed turning from your back to your side while in a flat bed without using bedrails?: None Help needed moving from lying on your back to sitting on the side of a flat bed without using bedrails?: None Help needed moving to and from a bed to a chair (including a  wheelchair)?: A Little Help needed standing up from a chair using your arms (e.g., wheelchair or bedside chair)?: A Little Help needed to walk in hospital room?: A Little Help needed climbing 3-5 steps with a railing? : A Little 6 Click Score: 20    End of Session Equipment Utilized During Treatment: Oxygen;Gait belt Activity Tolerance: Patient tolerated treatment well Patient left: in bed;with call bell/phone within reach Nurse Communication: Mobility status;Other (comment) (primofit needs to be reconnected) PT Visit Diagnosis: Difficulty in walking, not elsewhere classified (R26.2)     Time: 0037-0488 PT Time Calculation (min) (ACUTE ONLY): 23 min  Charges:                         Charles [name], PT Pager [phone]    Charles [name] 05/02/2021, 4:16 PM

## 2021-05-02 NOTE — Progress Notes (Signed)
NAME:  Charles [name], MRN:  [id number], DOB:  1966/06/29, LOS: 3 ADMISSION DATE:  04/29/2021, CONSULTATION DATE:  6/22 REFERRING MD:  Dr. Roslynn Amble, CHIEF COMPLAINT:  SOB/cough   History of Present Illness:  [name] a 55 yo M w/ pertinent PMH of DM2, HTN, HFrEF (EF 20-25%) due to NICM, Hodgkin's lymphoma, and schizophrenia presents to [facility] on 6/22 w/ cough and SOB.  Patient recently admitted in Dec 3016 w/ acute systolic HF w/ EF 01-09%. Patient also had mediastinal adenopathy and underwent lymph node biopsy. February 2022, patient seen in outpatient heart failure clinic and was given 40 Lasix and digoxin 0.125. Was also seen by hematology in February 2022 and was diagnosed stage I nodular lymphocyte Hodgkin's lymphoma. Patient's last radiation therapy given one month ago at [facility]. Patient has had several admissions to hospital for suicidal ideations this year. Patient has also had several admissions for CHF exacerbations this year. On 5/16 patient admitted to [facility] w/ decompensated HFrEF and PE. Was sent home on Apixaban. Patient recently presented to ED on 6/18 with cough/SOB and ran out of his home oxygen. Was given robitussin for cough and O2 was set up for him at his facility and was discharged.  Patient states his cough/sob has been progressively worsening since 04/26/2021. He wears O2 at home intermittently when SOB. He has orthopnea and is unable to walk without getting SOB. Does not take inhalers and denies history of lung disease. He states he has been taking his medications at home. Patient states his BLE are swelling more than usual, but has not been checking his daily weights at home. Denies fever.  Presents to [facility] on 6/22. ED course: Patient sob, coughing, and RR in 30s. Placed on 4 liters [place] with sats mid 90s. [name] also hypotensive and EKG shows normal sinus tach. Creatinine is 1.7. BNP 1,156.5. CXR shows cardiomegaly and pulmonary edema. Normal WBC, VBG, and Hgb. Covid and Flu  negative. Cardiology consulted.  PCCM consulted for admission to ICU, management of cardiogenic shock and respiratory management.  Pertinent  Medical History    has a past medical history of Arthritis, Bipolar 1 disorder ([place]), Cancer ([place]), Cardiomyopathy, nonischemic ([place]), CHF (congestive heart failure) ([place]), COVID (10/2020), Depression, DM2 (diabetes mellitus, type 2) ([place]), HTN (hypertension), and Myocardial infarction ([place]).   Significant Hospital Events: Including procedures, antibiotic start and stop dates in addition to other pertinent events   6/22: Patient admitted to [facility] 3/23 for acute systolic CHF exacerbation and cardiogenic shock. Placed on BiPAP. Milrinone and Lasix started 6/23 Off bipap, CI and CO improved, on Milrinone and not requiring pressors 6/24 decided on home hospice  Interim History / Subjective:  No events, intermittent choking episodes resulting in increased WOB. Diuresed another 5L.  Objective   Blood pressure 99/83, pulse (!) 110, temperature 98.6 F (37 C), temperature source Oral, resp. rate (!) 39, height 5\' 9"  (1.753 m), weight 102.6 kg, SpO2 92 %. CVP:  [3 mmHg-13 mmHg] 3 mmHg      Intake/Output Summary (Last 24 hours) at 05/02/2021 0723 Last data filed at 05/02/2021 0600 Gross per 24 hour  Intake 2007.82 ml  Output 7055 ml  Net -5047.18 ml    Filed Weights   04/29/21 2250 05/01/21 0500 05/02/21 0500  Weight: 106.9 kg 106.3 kg 102.6 kg   Constitutional: no acute distress lying supine in bed  Eyes: EOMI, pupils equal Ears, nose, mouth, and throat: MMM, trachea midline Cardiovascular: RRR, ext warm Respiratory: diminished  at bases, improved crackles, no accessory muscle use Gastrointestinal: soft, +BS Skin: No rashes, normal turgor Neurologic: moves all 4 ext to command Psychiatric: AOx3   Labs/imaging that I havepersonally reviewed  (right click and "Reselect all SmartList Selections" daily)  [place] 64 on 0.25 milrinone K  repleted  Resolved Hospital Problem list     Assessment & Plan:   Acute hypoxic respiratory failure secondary to acute volume overload and Cardiogenic shock superimposed on chronic HFrEF- improved  Hx PE- remains on AC Cardiorenal syndrome improved Hx of MDD and schizophrenia- continued on home risperdal Hx of HTN, HLD- continues on home statin, BP meds on hold due to hypotension Hx of Hypothyroidism- continues on home synthroid Hx of BPH- home synthroid on hold due to hypotension DM2- A1c 7.3%, on SSI here 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- on radiation therapy as OP End stage heart failure without treatment options- opted for home hospice once care can be arranged  - Continue milrinone and lasix with current dosing - Add morphine PO PRN dyspnea - Add midodrine to facilitate weaning off levophed - Working toward home hospice Monday hopefully  Patient critically ill due to cardiogenic shock Interventions to address this today levophed titration Risk of deterioration without these interventions is high  I personally spent 31 minutes providing critical care not including any separately billable procedures  Erskine Emery MD [facility]  Prefer epic messenger for cross cover needs If after hours, please call E-link

## 2021-05-02 NOTE — Progress Notes (Signed)
Advanced Heart Failure Rounding Note  PCP-Cardiologist: [name], MD      Subjective:    Seen by Palliative Care. Enrolling in Hospice (at [place])  Remains on milrinone 0.25. NE stopped  Co-ox 64%. Still with cough, Denies CP, orthopnea or PND. Diuresing well. Weight down 11 pounds total.    Objective:   Weight Range: 102.6 kg Body mass index is 33.4 kg/m.   Vital Signs:   Temp:  [97.3 F (36.3 C)-98.6 F (37 C)] 97.6 F (36.4 C) (06/25 0730) Pulse Rate:  [103-138] 129 (06/25 1430) Resp:  [20-53] 32 (06/25 1430) BP: (67-198)/(29-170) 83/70 (06/25 1430) SpO2:  [84 %-100 %] 94 % (06/25 1430) Weight:  [102.6 kg] 102.6 kg (06/25 0500) Last BM Date: 05/02/21  Weight change: Filed Weights   04/29/21 2250 05/01/21 0500 05/02/21 0500  Weight: 106.9 kg 106.3 kg 102.6 kg    Intake/Output:   Intake/Output Summary (Last 24 hours) at 05/02/2021 1505 Last data filed at 05/02/2021 1400 Gross per 24 hour  Intake 871.94 ml  Output 4030 ml  Net -3158.06 ml       Physical Exam    General:  Sitting up in chair No resp difficulty HEENT: normal Neck: supple.JVP to jaw Carotids 2+ bilat; no bruits. No lymphadenopathy or thryomegaly appreciated. Cor: Reg tachy +s3  3/6 MR Lungs: clear Abdomen: obese soft, nontender, + distended. No hepatosplenomegaly. No bruits or masses. Good bowel sounds. Extremities: no cyanosis, clubbing, rash, 2-3+ edema Neuro: alert & orientedx3, cranial nerves grossly intact. moves all 4 extremities w/o difficulty. Affect pleasant   Telemetry   Sinus 120s brief NSVT Personally reviewed  Labs    CBC Recent Labs    04/29/21 1803 04/29/21 1835 04/30/21 0451 05/01/21 0652  WBC 8.7  --  6.4 8.3  NEUTROABS 7.2  --   --   --   HGB 13.8   < > 14.1 12.6*  HCT 43.2   < > 42.3 38.5*  MCV 78.0*  --  75.4* 76.5*  PLT 291  --  191 256   < > = values in this interval not displayed.    Basic Metabolic Panel Recent Labs     04/30/21 0451 05/01/21 0652 05/01/21 1445 05/02/21 0336  NA 134* 129* 131* 134*  K 4.1 2.9* 4.5 3.7  CL 99 95* 96* 98  CO2 24 26 28 30   GLUCOSE 89 206* 160* 115*  BUN 25* 18 15 16   CREATININE 1.46* 1.16 1.08 1.04  CALCIUM 9.1 8.1* 8.4* 8.4*  MG 2.2 1.8  --  2.1  PHOS 4.9*  --   --   --     Liver Function Tests Recent Labs    04/29/21 1803  AST 25  ALT 36  ALKPHOS 162*  BILITOT 1.0  PROT 6.8  ALBUMIN 3.1*    No results for input(s): LIPASE, AMYLASE in the last 72 hours. Cardiac Enzymes No results for input(s): CKTOTAL, CKMB, CKMBINDEX, TROPONINI in the last 72 hours.  BNP: BNP (last 3 results) Recent Labs    01/11/21 1234 01/13/21 1741 04/29/21 1803  BNP 438.6* 433.1* 1,156.5*     ProBNP (last 3 results) No results for input(s): PROBNP in the last 8760 hours.   D-Dimer No results for input(s): DDIMER in the last 72 hours. Hemoglobin A1C Recent Labs    04/29/21 2148  HGBA1C 7.3*    Fasting Lipid Panel No results for input(s): CHOL, HDL, LDLCALC, TRIG, CHOLHDL, LDLDIRECT in the last 72 hours.  Thyroid Function Tests Recent Labs    04/29/21 2148  TSH 3.610     Other results:   Imaging    No results found.   Medications:     Scheduled Medications:  apixaban  5 mg Oral BID   atorvastatin  10 mg Oral Daily   Chlorhexidine Gluconate Cloth  6 each Topical Daily   Chlorhexidine Gluconate Cloth  6 each Topical Q0600   digoxin  125 mcg Oral Daily   furosemide  80 mg Intravenous TID   insulin aspart  0-15 Units Subcutaneous Q4H   levothyroxine  100 mcg Oral QAC breakfast   mouth rinse  15 mL Mouth Rinse BID   midodrine  5 mg Oral TID WC   mupirocin ointment  1 application Nasal BID   pantoprazole  40 mg Oral Daily   risperiDONE  0.5 mg Oral QHS   sodium chloride flush  10-40 mL Intracatheter Q12H    Infusions:  sodium chloride Stopped (05/02/21 0850)   milrinone 0.25 mcg/kg/min (05/02/21 1400)   norepinephrine (LEVOPHED) Adult  infusion Stopped (05/02/21 0727)    PRN Medications: docusate sodium, guaiFENesin-dextromethorphan, levalbuterol, morphine, polyethylene glycol, sodium chloride flush    Assessment/Plan   Cardiogenic Shock -Had LHC 10/2020 with normal cors. -ECHO --EF < 15%.  Repeat ECHO this admit. ---> EF < 20%  - On milrinone 0.25  Co-ox 64% - Diuresing well with IV lasix - No bb/arb with shock. - Hold SGLT2i - Not a candidate for advanced therapies. - supp K  He has end-stage HF with severe biventricular dysfunction and recurrent admissions. I think hospice is really the only option for him. Would continue IV lasix and milrinone at 0.25 to facilitate diuresis prior to d/c with Hospice. Turn milrinone off at d/c   2. AKI - Likely cardiorenal.ATN  - Renal function improving with inotropes. - Creatinine baseline 1.3-1.5. Scr down to 1/04 today -    3. Non Hodgkins Lymphoma - Completed 17/20 radiation treatments.   4. H/O PE - On eliquis 5 mg twice a day  5. DNR/DNI   CRITICAL CARE Performed by: Glori Bickers  Total critical care time: 35 minutes  Critical care time was exclusive of separately billable procedures and treating other patients.  Critical care was necessary to treat or prevent imminent or life-threatening deterioration.  Critical care was time spent personally by me (independent of midlevel providers or residents) on the following activities: development of treatment plan with patient and/or surrogate as well as nursing, discussions with consultants, evaluation of patient's response to treatment, examination of patient, obtaining history from patient or surrogate, ordering and performing treatments and interventions, ordering and review of laboratory studies, ordering and review of radiographic studies, pulse oximetry and re-evaluation of patient's condition.   Length of Stay: 3  Glori Bickers, MD  05/02/2021, 3:05 PM  Advanced Heart Failure Team Pager [phone]  (M-F; 7a - 5p)  Please contact [facility] for night-coverage after hours (5p -7a ) and weekends on [email or web address]

## 2021-05-03 DIAGNOSIS — Z515 Encounter for palliative care: Secondary | ICD-10-CM

## 2021-05-03 LAB — COOXEMETRY PANEL
Carboxyhemoglobin: 1.1 % (ref 0.5–1.5)
Methemoglobin: 0.7 % (ref 0.0–1.5)
O2 Saturation: 53 %
Total hemoglobin: 13.3 g/dL (ref 12.0–16.0)

## 2021-05-03 LAB — BASIC METABOLIC PANEL
Anion gap: 8 (ref 5–15)
BUN: 19 mg/dL (ref 6–20)
CO2: 29 mmol/L (ref 22–32)
Calcium: 8.5 mg/dL — ABNORMAL LOW (ref 8.9–10.3)
Chloride: 95 mmol/L — ABNORMAL LOW (ref 98–111)
Creatinine, Ser: 1.1 mg/dL (ref 0.61–1.24)
GFR, Estimated: >60 mL/min (ref >60)
Glucose, Bld: 137 mg/dL — ABNORMAL HIGH (ref 70–99)
Potassium: 3.9 mmol/L (ref 3.5–5.1)
Sodium: 132 mmol/L — ABNORMAL LOW (ref 135–145)

## 2021-05-03 LAB — GLUCOSE, CAPILLARY
Glucose-Capillary: 126 mg/dL — ABNORMAL HIGH (ref 70–99)
Glucose-Capillary: 107 mg/dL — ABNORMAL HIGH (ref 70–99)
Glucose-Capillary: 110 mg/dL — ABNORMAL HIGH (ref 70–99)
Glucose-Capillary: 111 mg/dL — ABNORMAL HIGH (ref 70–99)
Glucose-Capillary: 149 mg/dL — ABNORMAL HIGH (ref 70–99)
Glucose-Capillary: 154 mg/dL — ABNORMAL HIGH (ref 70–99)

## 2021-05-03 MED ORDER — METOLAZONE 2.5 MG PO TABS
5.0000 mg | ORAL_TABLET | Freq: Every day | ORAL | Status: DC
  Administered 2021-05-03 – 2021-05-04 (×2): 5 mg via ORAL
  Filled 2021-05-03 (×2): qty 2

## 2021-05-03 MED ORDER — POTASSIUM CHLORIDE CRYS ER 20 MEQ PO TBCR
40.0000 meq | EXTENDED_RELEASE_TABLET | Freq: Once | ORAL | Status: AC
  Administered 2021-05-03: 40 meq via ORAL
  Filled 2021-05-03: qty 2

## 2021-05-03 NOTE — Progress Notes (Signed)
Palliative Medicine Inpatient Follow Up Note  Reason for Consultation/Follow-up: Disposition and Establishing goals of care   HPI/Patient Profile: 55 y.o. male  with past medical history of DM2, HTN, HFrEF (EF 20-25%) due to NICM, Hodgkin's lymphoma, and schizophrenia admitted on 04/29/2021 with cough and shortness of breath.    Patient recently admitted in Dec 3532 w/ acute systolic HF w/ EF 99-24%. Patient also had mediastinal adenopathy and underwent lymph node biopsy. February 2022, patient seen in outpatient heart failure clinic and was given 40 Lasix and digoxin 0.125. Was also seen by hematology in February 2022 and was diagnosed stage I nodular lymphocyte Hodgkin's lymphoma. Patient's last radiation therapy given one month ago at [facility]. Patient has had several admissions to hospital for suicidal ideations this year. Patient has also had several admissions for CHF exacerbations this year. On 5/16 patient admitted to [facility] w/ decompensated HFrEF and PE. Was sent home on Apixaban. Patient recently presented to ED on 6/18 with cough/SOB and ran out of his home oxygen. Was given robitussin for cough and O2 was set up for him at his facility and was discharged.   Patient states his cough/sob has been progressively worsening since 04/26/2021. He wears O2 at home intermittently when SOB. He has orthopnea and is unable to walk without getting SOB. Does not take inhalers and denies history of lung disease. He states he has been taking his medications at home. Patient states his BLE are swelling more than usual, but has not been checking his daily weights at home. Denies fever.   Presents to [facility] ER on 6/22. ED course: Patient sob, coughing, and RR in 30s. Placed on 4 liters [place] with sats mid 90s. [name] also hypotensive and EKG shows normal sinus tach. Creatinine is 1.7. BNP 1,156.5. CXR shows cardiomegaly and pulmonary edema. Normal WBC, VBG, and Hgb. Covid and Flu negative. Cardiology consulted  and feels not a candidate for advanced therapies.  Today's Discussion (05/03/2021):  *Please note that this is a verbal dictation therefore any spelling or grammatical errors are due to the "[facility]" system interpretation.  Chart reviewed.   I met with Charles [name] this morning at bedside. He shares with me that he has had less pain in his feet as compared to the prior night. We discussed his ongoing cough and what aids in helping this, he endorses good symptom relief with guaifenesin.   From an equipment needs perspective, Charles [name] endorses wishing for a new hospital bed when he returns to his facility. He also requested a condom catheter and collection bag. I shared with him that I will pass this along to [place].  Reviewed with patient's bedside RN, Charles [name] that Jameses milrinone will be stopped right before transfer per conversation with Dr. Haroldine Laws note review.  Questions and concerns addressed   Objective Assessment: Vital Signs Vitals:   05/03/21 0700 05/03/21 0800  BP: 110/85 (!) 109/92  Pulse: (!) 120 (!) 117  Resp: (!) 34 (!) 34  Temp:    SpO2: 95% 95%    Intake/Output Summary (Last 24 hours) at 05/03/2021 0948 Last data filed at 05/03/2021 0800 Gross per 24 hour  Intake 633.44 ml  Output 3850 ml  Net -3216.56 ml   Last Weight  Most recent update: 05/03/2021  5:46 AM    Weight  103.2 kg (227 lb 8.2 oz)            Physical Exam Constitutional:      General: He is not  in acute distress.    Appearance: He is obese. He is not toxic-appearing. HENT:    Head: Normocephalic and atraumatic. Cardiovascular:    Rate and Rhythm: Regular rhythm. Tachycardia present. Pulmonary:    Effort: Pulmonary effort is normal. No tachypnea, accessory muscle usage or respiratory distress.    Breath sounds: Normal breath sounds. Abdominal:    Palpations: Abdomen is soft.    Tenderness: There is no abdominal tenderness. Musculoskeletal:    Right lower leg: No edema.     Left lower leg: No edema. Neurological:    General: No focal deficit present.    Mental Status: He is alert. Psychiatric:        Mood and Affect: Mood normal. Mood is not anxious.        Behavior: Behavior normal. Behavior is not agitated.   SUMMARY OF RECOMMENDATIONS   DNR MOST form on the chart Advanced directives on the chart Continue to treat the treatable Focus on comfort Symptomatic improvement with low-dose initiation of Neurontin for neuropathic pain Requested continuation of guaifenesin upon discharge and made hospice aware Plan for milrinone to stop prior to discharge Requested DME from [place] d/c with [facility] hospice hospice to [facility] assisted living facility   Time Spent: 35 Greater than 50% of the time was spent in counseling and coordination of care ______________________________________________________________________________________ [place] Team Team Cell Phone: [phone] Please utilize secure chat with additional questions, if there is no response within 30 minutes please call the above phone number  Palliative Medicine Team providers are available by phone from 7am to 7pm daily and can be reached through the team cell phone.  Should this patient require assistance outside of these hours, please call the patient's attending physician.

## 2021-05-03 NOTE — Progress Notes (Signed)
Advanced Heart Failure Rounding Note  PCP-Cardiologist: [name], MD      Subjective:    Seen by Palliative Care. Enrolling in Hospice (at [facility])  Remains on milrinone 0.25. NE stopped  Co-ox 64% -> 53%. On IV lasix. Out 3L but weight unchanged.   Objective:   Weight Range: 103.2 kg Body mass index is 33.6 kg/m.   Vital Signs:   Temp:  [97.7 F (36.5 C)-98.4 F (36.9 C)] 98.4 F (36.9 C) (06/26 0327) Pulse Rate:  [111-132] 122 (06/26 1100) Resp:  [15-55] 30 (06/26 1100) BP: (83-145)/(67-126) 114/90 (06/26 1100) SpO2:  [85 %-99 %] 95 % (06/26 1100) Weight:  [103.2 kg] 103.2 kg (06/26 0500) Last BM Date: 05/02/21  Weight change: Filed Weights   05/01/21 0500 05/02/21 0500 05/03/21 0500  Weight: 106.3 kg 102.6 kg 103.2 kg    Intake/Output:   Intake/Output Summary (Last 24 hours) at 05/03/2021 1246 Last data filed at 05/03/2021 1200 Gross per 24 hour  Intake 641.47 ml  Output 3850 ml  Net -3208.53 ml       Physical Exam    General:  Sitting up in chair No resp difficulty HEENT: normal Neck: supple.JVP to jaw Carotids 2+ bilat; no bruits. No lymphadenopathy or thryomegaly appreciated. Cor: Reg tachy +s3  3/6 MR Lungs: clear Abdomen: obese soft, nontender, + distended. No hepatosplenomegaly. No bruits or masses. Good bowel sounds. Extremities: no cyanosis, clubbing, rash, 2-3+ edema Neuro: alert & orientedx3, cranial nerves grossly intact. moves all 4 extremities w/o difficulty. Affect pleasant   Telemetry   Sinus 120s occasional NSVT Personally reviewed  Labs    CBC Recent Labs    05/01/21 0652  WBC 8.3  HGB 12.6*  HCT 38.5*  MCV 76.5*  PLT 161    Basic Metabolic Panel Recent Labs    05/01/21 0652 05/01/21 1445 05/02/21 0336 05/03/21 0300  NA 129*   < > 134* 132*  K 2.9*   < > 3.7 3.9  CL 95*   < > 98 95*  CO2 26   < > 30 29  GLUCOSE 206*   < > 115* 137*  BUN 18   < > 16 19  CREATININE 1.16   < > 1.04 1.10  CALCIUM  8.1*   < > 8.4* 8.5*  MG 1.8  --  2.1  --    < > = values in this interval not displayed.    Liver Function Tests No results for input(s): AST, ALT, ALKPHOS, BILITOT, PROT, ALBUMIN in the last 72 hours.  No results for input(s): LIPASE, AMYLASE in the last 72 hours. Cardiac Enzymes No results for input(s): CKTOTAL, CKMB, CKMBINDEX, TROPONINI in the last 72 hours.  BNP: BNP (last 3 results) Recent Labs    01/11/21 1234 01/13/21 1741 04/29/21 1803  BNP 438.6* 433.1* 1,156.5*     ProBNP (last 3 results) No results for input(s): PROBNP in the last 8760 hours.   D-Dimer No results for input(s): DDIMER in the last 72 hours. Hemoglobin A1C No results for input(s): HGBA1C in the last 72 hours.  Fasting Lipid Panel No results for input(s): CHOL, HDL, LDLCALC, TRIG, CHOLHDL, LDLDIRECT in the last 72 hours. Thyroid Function Tests No results for input(s): TSH, T4TOTAL, T3FREE, THYROIDAB in the last 72 hours.  Invalid input(s): FREET3   Other results:   Imaging    No results found.   Medications:     Scheduled Medications:  apixaban  5 mg Oral BID  atorvastatin  10 mg Oral Daily   Chlorhexidine Gluconate Cloth  6 each Topical Daily   Chlorhexidine Gluconate Cloth  6 each Topical Q0600   digoxin  125 mcg Oral Daily   furosemide  80 mg Intravenous TID   gabapentin  100 mg Oral QHS   insulin aspart  0-15 Units Subcutaneous Q4H   levothyroxine  100 mcg Oral QAC breakfast   mouth rinse  15 mL Mouth Rinse BID   mupirocin ointment  1 application Nasal BID   pantoprazole  40 mg Oral Daily   risperiDONE  0.5 mg Oral QHS   sodium chloride flush  10-40 mL Intracatheter Q12H    Infusions:  sodium chloride Stopped (05/02/21 0850)   milrinone 0.25 mcg/kg/min (05/03/21 1200)   norepinephrine (LEVOPHED) Adult infusion Stopped (05/02/21 0727)    PRN Medications: docusate sodium, guaiFENesin-dextromethorphan, levalbuterol, morphine, polyethylene glycol, sodium chloride  flush    Assessment/Plan   Cardiogenic Shock -Had LHC 10/2020 with normal cors. -ECHO --EF < 15%.  Repeat ECHO this admit. ---> EF < 20%  - On milrinone 0.25  Co-ox 64% -> 53% - Remains on IV lasix. Good urine output but weight unchanged. Continue IV lasix. Will give one dose metoalzone to facilitate further diuresis prior to d/c.  - No bb/arb with shock. - Hold SGLT2i - Not a candidate for advanced therapies. - supp K  He has end-stage HF with severe biventricular dysfunction and recurrent admissions. I agree hospice is really the only option for him. Would continue IV lasix and milrinone at 0.25 to facilitate diuresis prior to d/c with Hospice. Turn milrinone off at d/c  HF meds on d/c: - Eliquis 5 bid - digoxin 0.125 daily - spiro 12.5 daily - torsemide 40 bid - kdur 20 bid  (Stop Farxiga, metoprolol, atorva and Entresto)   2. AKI - Likely cardiorenal.ATN  - Renal function improving with inotropes. - Creatinine baseline 1.3-1.5. Scr down to 1.10 today -    3. Non Hodgkins Lymphoma - Completed 17/20 radiation treatments.   4. H/O PE - On eliquis 5 mg twice a day  5. DNR/DNI   The HF team will s/o.   HF meds on d/c: - Eliquis 5 bid - digoxin 0.125 daily - spiro 12.5 daily - torsemide 40 bid - kdur 20 bid  Length of Stay: 4  Glori Bickers, MD  05/03/2021, 12:46 PM  Advanced Heart Failure Team Pager [phone] (M-F; 7a - 5p)  Please contact [facility] for night-coverage after hours (5p -7a ) and weekends on [email or web address]

## 2021-05-03 NOTE — Progress Notes (Signed)
Manufacturing engineer [facility])       Mr. Heintzelman has been referred to our hospice services at [facility] after discharge.  Report exchanged with Sharyn Lull, NP with PMT.  Visited pt at bedside.  Pt awoke to his name, Aox4, no distress noted.  Pt requested hospital bed, and during discussion of process to replace existing bed pt was clear that he would not want delivery of the hospital bed to delay discharge.  Plan made for Mr. Bonsignore to [employer] back to [facility] to the existing bed, and then to have a hospital bed ordered by [facility] once he is there to oversee the removal of his bed.  Discussed, also, condom catheter use after discharge.  Pt to ask to dc with condom cath in place.  [facility] can order these along with other supplies once pt is admitted.   Please send signed and completed DNR form home with patient/family.   Please provide prescriptions for mediations at discharge to ensure comfort until patient can be admitted onto hospice services.    [facility] will continue to follow for any discharge planning needs and to coordinate admission onto hospice care.    Thank you for the opportunity to participate in this patient's care.     Domenic Moras, BSN, RN [facility] Liaison [phone] (24h on call)

## 2021-05-03 NOTE — Progress Notes (Signed)
NAME:  Charles [name], MRN:  [id number], DOB:  1966-07-24, LOS: 4 ADMISSION DATE:  04/29/2021, CONSULTATION DATE:  6/22 REFERRING MD:  Dr. Roslynn Amble, CHIEF COMPLAINT:  SOB/cough   History of Present Illness:  [name] a 55 yo M w/ pertinent PMH of DM2, HTN, HFrEF (EF 20-25%) due to NICM, Hodgkin's lymphoma, and schizophrenia presents to [facility] on 6/22 w/ cough and SOB.  Patient recently admitted in Dec 4132 w/ acute systolic HF w/ EF 44-01%. Patient also had mediastinal adenopathy and underwent lymph node biopsy. February 2022, patient seen in outpatient heart failure clinic and was given 40 Lasix and digoxin 0.125. Was also seen by hematology in February 2022 and was diagnosed stage I nodular lymphocyte Hodgkin's lymphoma. Patient's last radiation therapy given one month ago at [facility]. Patient has had several admissions to hospital for suicidal ideations this year. Patient has also had several admissions for CHF exacerbations this year. On 5/16 patient admitted to [facility] w/ decompensated HFrEF and PE. Was sent home on Apixaban. Patient recently presented to ED on 6/18 with cough/SOB and ran out of his home oxygen. Was given robitussin for cough and O2 was set up for him at his facility and was discharged.  Patient states his cough/sob has been progressively worsening since 04/26/2021. He wears O2 at home intermittently when SOB. He has orthopnea and is unable to walk without getting SOB. Does not take inhalers and denies history of lung disease. He states he has been taking his medications at home. Patient states his BLE are swelling more than usual, but has not been checking his daily weights at home. Denies fever.  Presents to [facility] ER on 6/22. ED course: Patient sob, coughing, and RR in 30s. Placed on 4 liters [place] with sats mid 90s. [name] also hypotensive and EKG shows normal sinus tach. Creatinine is 1.7. BNP 1,156.5. CXR shows cardiomegaly and pulmonary edema. Normal WBC, VBG, and Hgb. Covid and Flu  negative. Cardiology consulted.  PCCM consulted for admission to ICU, management of cardiogenic shock and respiratory management.  Pertinent  Medical History    has a past medical history of Arthritis, Bipolar 1 disorder ([place]), Cancer ([place]), Cardiomyopathy, nonischemic ([place]), CHF (congestive heart failure) ([place]), COVID (10/2020), Depression, DM2 (diabetes mellitus, type 2) ([place]), HTN (hypertension), and Myocardial infarction ([place]).   Significant Hospital Events: Including procedures, antibiotic start and stop dates in addition to other pertinent events   6/22: Patient admitted to [facility] 0/27 for acute systolic CHF exacerbation and cardiogenic shock. Placed on BiPAP. Milrinone and Lasix started 6/23 Off bipap, CI and CO improved, on Milrinone and not requiring pressors 6/24 decided on home hospice  Interim History / Subjective:  Feels well, excited to go home tomorrow.  Objective   Blood pressure (!) 116/100, pulse (!) 122, temperature 98.4 F (36.9 C), temperature source Oral, resp. rate 17, height 5\' 9"  (1.753 m), weight 103.2 kg, SpO2 94 %. CVP:  [3 mmHg-9 mmHg] 9 mmHg      Intake/Output Summary (Last 24 hours) at 05/03/2021 0744 Last data filed at 05/03/2021 0500 Gross per 24 hour  Intake 645.15 ml  Output 3850 ml  Net -3204.85 ml    Filed Weights   05/01/21 0500 05/02/21 0500 05/03/21 0500  Weight: 106.3 kg 102.6 kg 103.2 kg   Constitutional: no acute distress  Eyes: EOMI, pupils equal Ears, nose, mouth, and throat: MMM, trachea midline Cardiovascular: tachycardic, ext warm Respiratory: crackles improved, no accessory muscle use Gastrointestinal: soft, +BS Skin:  No rashes, normal turgor Neurologic: moves all 4 ext to command Psychiatric: RASS 0    Labs/imaging that I havepersonally reviewed  (right click and "Reselect all SmartList Selections" daily)  Coox 53 on 0.25 milrinone K repleted  Resolved Hospital Problem list     Assessment & Plan:   Acute hypoxic  respiratory failure secondary to acute volume overload and Cardiogenic shock superimposed on chronic HFrEF- improved  Hx PE- remains on AC Cardiorenal syndrome improved Hx of MDD and schizophrenia- continued on home risperdal Hx of HTN, HLD- continues on home statin, BP meds on hold due to hypotension Hx of Hypothyroidism- continues on home synthroid Hx of BPH- home synthroid on hold due to hypotension DM2- A1c 7.3%, on SSI here 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

## 2021-05-04 LAB — GLUCOSE, CAPILLARY
Glucose-Capillary: 185 mg/dL — ABNORMAL HIGH (ref 70–99)
Glucose-Capillary: 145 mg/dL — ABNORMAL HIGH (ref 70–99)
Glucose-Capillary: 168 mg/dL — ABNORMAL HIGH (ref 70–99)

## 2021-05-04 LAB — BASIC METABOLIC PANEL
Anion gap: 11 (ref 5–15)
BUN: 20 mg/dL (ref 6–20)
CO2: 31 mmol/L (ref 22–32)
Calcium: 8.9 mg/dL (ref 8.9–10.3)
Chloride: 91 mmol/L — ABNORMAL LOW (ref 98–111)
Creatinine, Ser: 1.18 mg/dL (ref 0.61–1.24)
GFR, Estimated: >60 mL/min (ref >60)
Glucose, Bld: 102 mg/dL — ABNORMAL HIGH (ref 70–99)
Potassium: 2.9 mmol/L — ABNORMAL LOW (ref 3.5–5.1)
Sodium: 133 mmol/L — ABNORMAL LOW (ref 135–145)

## 2021-05-04 LAB — COOXEMETRY PANEL
Carboxyhemoglobin: 1.2 % (ref 0.5–1.5)
Methemoglobin: 0.6 % (ref 0.0–1.5)
O2 Saturation: 66.2 %
Total hemoglobin: 13.9 g/dL (ref 12.0–16.0)

## 2021-05-04 LAB — CULTURE, BLOOD (ROUTINE X 2)
Culture: NO GROWTH
Special Requests: ADEQUATE

## 2021-05-04 MED ORDER — GABAPENTIN 100 MG PO CAPS: 100.0000 mg | ORAL_CAPSULE | Freq: Every day | ORAL | 0 refills | Status: AC

## 2021-05-04 MED ORDER — TORSEMIDE 20 MG PO TABS: 40.0000 mg | ORAL_TABLET | Freq: Two times a day (BID) | ORAL | 0 refills | Status: AC

## 2021-05-04 MED ORDER — MORPHINE SULFATE 15 MG PO TABS: 15.0000 mg | ORAL_TABLET | ORAL | 0 refills | Status: AC | PRN

## 2021-05-04 MED ORDER — DOCUSATE SODIUM 100 MG PO CAPS
100.0000 mg | ORAL_CAPSULE | Freq: Two times a day (BID) | ORAL | 0 refills | Status: AC | PRN
Start: 2021-05-04 — End: ?

## 2021-05-04 MED ORDER — POTASSIUM CHLORIDE 10 MEQ/100ML IV SOLN
10.0000 meq | INTRAVENOUS | Status: AC
  Administered 2021-05-04 (×7): 10 meq via INTRAVENOUS
  Filled 2021-05-04 (×7): qty 100

## 2021-05-04 NOTE — TOC Transition Note (Signed)
Transition of Care [facility]) - CM/SW Discharge Note   Patient Details  Name: Charles [name] MRN: [id number] Date of Birth: 24-Oct-1966  Transition of Care [facility]) CM/SW Contact:  Sharin Mons, RN Phone Number: 05/04/2021, 1:20 PM   Clinical Narrative:    Patient will DC to: [facility] Anticipated DC date: 05/04/2021 Family notified: yes Transport by: [facility]  Presented with sob/ cough. Hx of dm2, htn, hfref (ef 20-25) due to NICM, hodgkins lymphoma and schizophrenia.  Per MD patient ready for DC today . RN, patient, patient's family, Immunologist and facility notified of DC.  [facility] to do intake 6/28 @ 9 am @ the facility.Transportation papers on chart. Ambulance transport requested for patient.    Remonia Richter ([place]       [phone]      RNCM will sign off for now as intervention is no longer needed. Please consult [place] again if new needs arise.    Final next level of care: Assisted Living Barriers to Discharge: No Barriers Identified   Patient Goals and CMS Choice        Discharge Placement                       Discharge Plan and Services                                     Social Determinants of Health (SDOH) Interventions Food Insecurity Interventions: Intervention Not Indicated, Other (Comment) (Pt. reports getting food stamps already) Financial Strain Interventions: Intervention Not Indicated Housing Interventions: Intervention Not Indicated Transportation Interventions: Financial planner (facility provides transportation)   Readmission Risk Interventions Readmission Risk Prevention Plan 01/06/2021  Medication Review (RN Care Manager) Complete  HRI or Home Care Consult Complete  SW Recovery Care/Counseling Consult Complete  [place] Not Applicable  Some recent data might be hidden

## 2021-05-04 NOTE — Progress Notes (Signed)
Patient discharged via [facility] to [facility]. Report called and given to facility. Vitals and assessments all WDL at time of discharge.

## 2021-05-04 NOTE — Discharge Summary (Deleted)
Resident Discharge Summary  Patient ID: RHYLAND [name] MRN: [id number] DOB/AGE: 01-16-1966 55 y.o.  Admit date: 04/29/2021 Discharge date: 05/04/2021  Problem List Principal Problem:   Cardiogenic shock [facility]) Active Problems:   Hypertension   Shortness of breath   DM2 (diabetes mellitus, type 2) (HCC)   Hodgkin lymphoma ([place])   Acute on chronic heart failure with reduced ejection fraction and diastolic dysfunction (HCC)   MDD (major depressive disorder), recurrent, severe, with psychosis ([place])   Cardiorenal syndrome with renal failure  HPI: [name] is a 55 year old male with a PMHX of HFrEF (EF 20-25%) 2/2 NICM, hypertension, type II DM, Hodgkin's lymphoma and schizophrenia who presented with dyspnea at rest and 4-5 pillow orthopnea. He was recently admitted in 10/2020 with acute systolic HF with EF 29-92%, he was started on Lasix and digoxin at outpatient follow up. He was diagnosed with stage I nodular lymphocyte Hodgkin's lymphoma in February 2022 and underwent radiation therapy with the last session being one month prior at [facility]. He has had several admissions for suicidal ideation this year. Patient was recently started on Eliquis after admission at [facility] for decompensated HFrEF and PE. He was noted to have EF<15% at the time. He was evaluated in the ED on 6/18 for cough/ shortness of breath and ran out of his home oxygen. He was set up with oxygen and discharged to facility. He had progressively worsening cough and shortness of breath since 04/26/2021 with orthopnea and significant dyspnea on exertion. He noted bilateral lower extremity swelling. He was noted to be hypotensive with sinus tachycardia in the ED and was admitted to the ICU for management of cardiogenic shock.   Hospital Course: Patient admitted to [facility] on 6/22 for acute HFrEF exacerbation and cardiogenic shock. He was placed on BiPAP for respiratory distress and started on Lasix and Milrinone with  improvement in cardiac index and co-ox. Advanced HF team consulted. Patient not a candidate for advanced therapies.Palliative care consulted. Patient hospice appropriate and set up with Home Hospice (ALF). Patient discharged to ALF on Eliquis, digoxin, spironolactone, torsemide, and potassium supplement.   Labs at discharge Lab Results  Component Value Date   CREATININE 1.18 05/04/2021   BUN 20 05/04/2021   NA 133 (L) 05/04/2021   K 2.9 (L) 05/04/2021   CL 91 (L) 05/04/2021   CO2 31 05/04/2021   Lab Results  Component Value Date   WBC 8.3 05/01/2021   HGB 12.6 (L) 05/01/2021   HCT 38.5 (L) 05/01/2021   MCV 76.5 (L) 05/01/2021   PLT 256 05/01/2021   Lab Results  Component Value Date   ALT 36 04/29/2021   AST 25 04/29/2021   ALKPHOS 162 (H) 04/29/2021   BILITOT 1.0 04/29/2021   Lab Results  Component Value Date   INR 1.1 01/04/2021   INR 1.0 12/16/2020   INR 1.0 11/29/2020    Current radiology studies CXR 6/22 > Mediastinal lymphadenopathy, increased central airspace disease suggest pulmonary edema, cardiomegaly CXR 6/23 >  Persistent mediastinal lymphadenopathy, cardiomegaly without pulmonary venous congestion; bilateral perihilar infiltrates/edema noted with small left pleural effusion  Echo 6/23 > LVEF <20% with severely decreased LV function; global hypokinesis of LV; grade III diastolic dysfunction (restrictive). RV systolic function mildly reduced. LA and RA mildly dilated. No valvular abnormalities.   Disposition:  Home w/ Hospice at ALF    Allergies as of 05/04/2021       Reactions   Ibuprofen Other (See Comments)  Unknown per MAR   Penicillins Rash        Medication List     STOP taking these medications    atorvastatin 10 MG tablet Commonly known as: LIPITOR   dapagliflozin propanediol 10 MG Tabs tablet Commonly known as: FARXIGA   Entresto 24-26 MG Generic drug: sacubitril-valsartan   furosemide 80 MG tablet Commonly known as: LASIX    metoprolol succinate 25 MG 24 hr tablet Commonly known as: TOPROL-XL   traMADol 50 MG tablet Commonly known as: ULTRAM       TAKE these medications    acetaminophen 325 MG tablet Commonly known as: TYLENOL Take 650 mg by mouth every 6 (six) hours as needed for moderate pain.   albuterol (2.5 MG/3ML) 0.083% nebulizer solution Commonly known as: PROVENTIL Take 2.5 mg by nebulization in the morning and at bedtime.   albuterol 108 (90 Base) MCG/ACT inhaler Commonly known as: VENTOLIN HFA Inhale 2 puffs into the lungs 4 (four) times daily as needed for wheezing or shortness of breath.   blood glucose meter kit and supplies Kit Dispense based on patient and insurance preference. Use up to four times daily as directed. (FOR ICD-9 250.00, 250.01).   digoxin 0.125 MG tablet Commonly known as: Lanoxin Take 1 tablet (125 mcg total) by mouth daily.   docusate sodium 100 MG capsule Commonly known as: COLACE Take 1 capsule (100 mg total) by mouth 2 (two) times daily as needed for mild constipation.   Eliquis 5 MG Tabs tablet Generic drug: apixaban Take 5 mg by mouth 2 (two) times daily.   gabapentin 100 MG capsule Commonly known as: NEURONTIN Take 1 capsule (100 mg total) by mouth at bedtime.   hydrOXYzine 25 MG capsule Commonly known as: VISTARIL Take 25 mg by mouth See admin instructions. Tid as needed for anxiety for up to 10 days   levothyroxine 100 MCG tablet Commonly known as: SYNTHROID Take 100 mcg by mouth daily before breakfast.   melatonin 3 MG Tabs tablet Take 3 mg by mouth at bedtime.   morphine 15 MG tablet Commonly known as: MSIR Take 1 tablet (15 mg total) by mouth every 2 (two) hours as needed (dyspnea).   pantoprazole 40 MG tablet Commonly known as: PROTONIX Take 40 mg by mouth daily before breakfast.   PEG 3350 17 g Pack Take 1 packet by mouth in the morning and at bedtime. What changed: Another medication with the same name was removed. Continue  taking this medication, and follow the directions you see here.   PEN NEEDLES 29GX1/2" 29G X 12MM Misc 1 each by Does not apply route daily.   BD Pen Needle Nano U/F 32G X 4 MM Misc Generic drug: Insulin Pen Needle USE AS DIRECTED.   potassium chloride SA 20 MEQ tablet Commonly known as: KLOR-CON TAKE 2 TABLETS (40 MEQ TOTAL) BY MOUTH DAILY.   risperiDONE 0.5 MG tablet Commonly known as: RISPERDAL Take 1 tablet (0.5 mg total) by mouth at bedtime.   spironolactone 25 MG tablet Commonly known as: Aldactone Take 0.5 tablets (12.5 mg total) by mouth daily.   tamsulosin 0.4 MG Caps capsule Commonly known as: FLOMAX Take 0.4 mg by mouth in the morning.   torsemide 20 MG tablet Commonly known as: Demadex Take 2 tablets (40 mg total) by mouth 2 (two) times daily.       ASK your doctor about these medications    brimonidine 0.2 % ophthalmic solution Commonly known as: ALPHAGAN Place 1 drop into  both eyes 2 (two) times daily.   senna 8.6 MG Tabs tablet Commonly known as: SENOKOT Take 2 tablets (17.2 mg total) by mouth 2 (two) times daily.          Discharged Condition: stable  Time spent on discharge greater than 40 minutes.  Vital signs at Discharge. Temp:  [97.8 F (36.6 C)-98.1 F (36.7 C)] 97.8 F (36.6 C) (06/27 1121) Pulse Rate:  [82-135] 135 (06/27 1100) Resp:  [13-42] 18 (06/27 1100) BP: (92-124)/(70-91) 109/81 (06/27 1100) SpO2:  [89 %-98 %] 94 % (06/27 1100) Weight:  [99.5 kg] 99.5 kg (06/27 0500) Office follow up Special Information or instructions.  Signed: Harvie Heck, MD Internal Medicine, PGY-3 05/04/2021, 11:50 AM See Amion for pager If no response to pager, please call [phone] until 1900 After 1900 please call [facility]

## 2021-05-04 NOTE — NC FL2 (Signed)
[place] LEVEL OF CARE SCREENING TOOL     IDENTIFICATION  Patient Name: Charles [name] Birthdate: 11-18-1965 Sex: male Admission Date (Current Location): 04/29/2021  [facility] and [place] Number:  Herbalist and Address:  [facility] [place], [place] [address], [place], [place] [address]      Provider Number: [id number]  Attending Physician Name and Address:  Audria Nine, DO  Relative Name and Phone Number:       Current Level of Care: Hospital Recommended Level of Care: [place] (with hospice care) Prior Approval Number:    Date Approved/Denied:   PASRR Number:    Discharge Plan: Other (Comment) [name] with hospice care)    Current Diagnoses: Patient Active Problem List   Diagnosis Date Noted   Cardiogenic shock ([place]) 04/29/2021   Cardiorenal syndrome with renal failure 04/29/2021   Homelessness 03/21/2021   Suicidal ideations 03/20/2021   MDD (major depressive disorder), recurrent, severe, with psychosis ([place]) 02/28/2021   Acute hypoxemic respiratory failure ([place]) 01/13/2021   Acute on chronic heart failure with reduced ejection fraction and diastolic dysfunction ([place]) 01/12/2021   Schizophrenia ([place]) 01/04/2021   Diarrhea 01/04/2021   Pulmonary edema    Mediastinal adenopathy 12/18/2020   Adjustment disorder with mixed anxiety and depressed mood 10/30/2020   Mediastinal lymphadenopathy 10/11/2020   Shortness of breath 10/11/2020   Sinus tachycardia 10/11/2020   Night sweats 10/11/2020   DM2 (diabetes mellitus, type 2) ([place]) 10/11/2020   Hodgkin lymphoma ([place]) 10/11/2020   Hypertension 09/25/2020   BPH (benign prostatic hyperplasia) 09/25/2020   Insomnia 09/25/2020    Orientation RESPIRATION BLADDER Height & Weight     Self, Time, Situation, Place  O2 (oxygen 3l/min ) External catheter Weight: 99.5 kg Height:  5' 9" (175.3 cm)  BEHAVIORAL SYMPTOMS/MOOD NEUROLOGICAL BOWEL NUTRITION STATUS       Continent Diet (Regular diet)  AMBULATORY STATUS COMMUNICATION OF NEEDS Skin   Limited Assist Verbally Normal                       Personal Care Assistance Level of Assistance  Bathing, Feeding, Dressing Bathing Assistance: Limited assistance Feeding assistance: Independent Dressing Assistance: Limited assistance     Functional Limitations Info  Sight, Speech, Hearing          SPECIAL CARE FACTORS FREQUENCY                       Contractures Contractures Info: Not present    Additional Factors Info  Code Status, Allergies Code Status Info: DNR Allergies Info: Ibuprofen, Penicillins           Current Medications (05/04/2021):  This is the current hospital active medication list Current Facility-Administered Medications  Medication Dose Route Frequency Provider Last Rate Last Admin   0.9 %  sodium chloride infusion  250 mL Intravenous Continuous Juanito Doom, MD   Stopped at 05/02/21 0850   apixaban (ELIQUIS) tablet 5 mg  5 mg Oral BID Gleason, Otilio Carpen, PA-C   5 mg at 05/04/21 0910   atorvastatin (LIPITOR) tablet 10 mg  10 mg Oral Daily Mick Sell, PA-C   10 mg at 05/04/21 9470   Chlorhexidine Gluconate Cloth 2 % PADS 6 each  6 each Topical Daily Deland Pretty, MD   6 each at 05/03/21 2030   digoxin (LANOXIN) tablet 125 mcg  125 mcg Oral Daily Mick Sell, PA-C  125 mcg at 05/04/21 0910   docusate sodium (COLACE) capsule 100 mg  100 mg Oral BID PRN Corey Harold, NP       furosemide (LASIX) injection 80 mg  80 mg Intravenous TID Lyda Jester M, PA-C   80 mg at 05/04/21 0911   gabapentin (NEURONTIN) capsule 100 mg  100 mg Oral QHS Walden Field A, NP   100 mg at 05/03/21 2203   guaiFENesin-dextromethorphan (ROBITUSSIN DM) 100-10 MG/5ML syrup 10 mL  10 mL Oral Q4H PRN Anders Simmonds, MD   10 mL at 05/04/21 1109   insulin aspart (novoLOG) injection 0-15 Units  0-15 Units Subcutaneous Q4H Mick Sell, PA-C   3 Units at 05/04/21  1137   levalbuterol (XOPENEX) nebulizer solution 0.63 mg  0.63 mg Nebulization Q6H PRN Mick Sell, PA-C   0.63 mg at 04/29/21 2318   levothyroxine (SYNTHROID) tablet 100 mcg  100 mcg Oral QAC breakfast Mick Sell, PA-C   100 mcg at 05/04/21 4097   MEDLINE mouth rinse  15 mL Mouth Rinse BID Candee Furbish, MD   15 mL at 05/03/21 2204   metolazone (ZAROXOLYN) tablet 5 mg  5 mg Oral Daily Bensimhon, Shaune Pascal, MD   5 mg at 05/04/21 0910   milrinone (PRIMACOR) 20 MG/100 ML (0.2 mg/mL) infusion  0.25 mcg/kg/min Intravenous Continuous Bensimhon, Shaune Pascal, MD 8.02 mL/hr at 05/04/21 1200 0.25 mcg/kg/min at 05/04/21 1200   morphine (MSIR) tablet 15 mg  15 mg Oral Q2H PRN Candee Furbish, MD   15 mg at 05/02/21 1155   mupirocin ointment (BACTROBAN) 2 % 1 application  1 application Nasal BID Simonne Maffucci B, MD   1 application at 35/32/99 2204   norepinephrine (LEVOPHED) 16 mg in 235m premix infusion  0-40 mcg/min Intravenous Titrated MJuanito Doom MD   Stopped at 05/04/21 0543   pantoprazole (PROTONIX) EC tablet 40 mg  40 mg Oral Daily Gleason, LOtilio Carpen PA-C   40 mg at 05/04/21 0910   polyethylene glycol (MIRALAX / GLYCOLAX) packet 17 g  17 g Oral Daily PRN HCorey Harold NP       potassium chloride 10 mEq in 100 mL IVPB  10 mEq Intravenous Q1H SAnders Simmonds MD 100 mL/hr at 05/04/21 1200 Infusion Verify at 05/04/21 1200   risperiDONE (RISPERDAL) tablet 0.5 mg  0.5 mg Oral QHS Gleason, LOtilio Carpen PA-C   0.5 mg at 05/03/21 2203   sodium chloride flush (NS) 0.9 % injection 10-40 mL  10-40 mL Intracatheter Q12H MSimonne MaffucciB, MD   10 mL at 05/04/21 1012   sodium chloride flush (NS) 0.9 % injection 10-40 mL  10-40 mL Intracatheter PRN MJuanito Doom MD         Discharge Medications: Please see discharge summary for a list of discharge medications.  Medication List       STOP taking these medications     atorvastatin 10 MG tablet Commonly known as: LIPITOR    dapagliflozin  propanediol 10 MG Tabs tablet Commonly known as: FARXIGA    Entresto 24-26 MG Generic drug: sacubitril-valsartan    furosemide 80 MG tablet Commonly known as: LASIX    metoprolol succinate 25 MG 24 hr tablet Commonly known as: TOPROL-XL    traMADol 50 MG tablet Commonly known as: ULTRAM           TAKE these medications     acetaminophen 325 MG tablet Commonly known as: TYLENOL Take 650  mg by mouth every 6 (six) hours as needed for moderate pain.    albuterol (2.5 MG/3ML) 0.083% nebulizer solution Commonly known as: PROVENTIL Take 2.5 mg by nebulization in the morning and at bedtime.    albuterol 108 (90 Base) MCG/ACT inhaler Commonly known as: VENTOLIN HFA Inhale 2 puffs into the lungs 4 (four) times daily as needed for wheezing or shortness of breath.    blood glucose meter kit and supplies Kit Dispense based on patient and insurance preference. Use up to four times daily as directed. (FOR ICD-9 250.00, 250.01).    digoxin 0.125 MG tablet Commonly known as: Lanoxin Take 1 tablet (125 mcg total) by mouth daily.    docusate sodium 100 MG capsule Commonly known as: COLACE Take 1 capsule (100 mg total) by mouth 2 (two) times daily as needed for mild constipation.    Eliquis 5 MG Tabs tablet Generic drug: apixaban Take 5 mg by mouth 2 (two) times daily.    gabapentin 100 MG capsule Commonly known as: NEURONTIN Take 1 capsule (100 mg total) by mouth at bedtime.    hydrOXYzine 25 MG capsule Commonly known as: VISTARIL Take 25 mg by mouth See admin instructions. Tid as needed for anxiety for up to 10 days    levothyroxine 100 MCG tablet Commonly known as: SYNTHROID Take 100 mcg by mouth daily before breakfast.    melatonin 3 MG Tabs tablet Take 3 mg by mouth at bedtime.    morphine 15 MG tablet Commonly known as: MSIR Take 1 tablet (15 mg total) by mouth every 2 (two) hours as needed (dyspnea).    pantoprazole 40 MG tablet Commonly known as: PROTONIX Take  40 mg by mouth daily before breakfast.    PEG 3350 17 g Pack Take 1 packet by mouth in the morning and at bedtime. What changed: Another medication with the same name was removed. Continue taking this medication, and follow the directions you see here.    PEN NEEDLES 29GX1/2" 29G X 12MM Misc 1 each by Does not apply route daily.    BD Pen Needle Nano U/F 32G X 4 MM Misc Generic drug: Insulin Pen Needle USE AS DIRECTED.    potassium chloride SA 20 MEQ tablet Commonly known as: KLOR-CON TAKE 2 TABLETS (40 MEQ TOTAL) BY MOUTH DAILY.    risperiDONE 0.5 MG tablet Commonly known as: RISPERDAL Take 1 tablet (0.5 mg total) by mouth at bedtime.    spironolactone 25 MG tablet Commonly known as: Aldactone Take 0.5 tablets (12.5 mg total) by mouth daily.    tamsulosin 0.4 MG Caps capsule Commonly known as: FLOMAX Take 0.4 mg by mouth in the morning.    torsemide 20 MG tablet Commonly known as: Demadex Take 2 tablets (40 mg total) by mouth 2 (two) times daily.           ASK your doctor about these medications     brimonidine 0.2 % ophthalmic solution Commonly known as: ALPHAGAN Place 1 drop into both eyes 2 (two) times daily.    senna 8.6 MG Tabs tablet Commonly known as: SENOKOT Take 2 tablets (17.2 mg total) by mouth 2 (two) times daily.         Relevant Imaging Results:  Relevant Lab Results:   Additional Information [id number]/ Pt has had Covid vaccine/ Pt has lymphoma  Sharin Mons, RN

## 2021-05-04 NOTE — Progress Notes (Signed)
[facility])     This patient has been referred for hospice services t [facility] and is set up with an admission assessment visit at 930am on Tuesday.  Spoke with Mr. Hyams by phone to answer and questions and to arrange DME.  Mr Takahashi asks that a bed and wheelchair be delivered tomorrow as not to delay discharge from [facility].  These have been ordered.  Coordinated with bedside RN about his requests regarding condom catheters.  No new needs at this time.  [facility] will continue to follow for any discharge planning needs and to coordinate admission onto hospice care.    Thank you for the opportunity to participate in this patient's care.     Domenic Moras, BSN, RN [facility] Liaison [phone] (24h on call)

## 2021-05-04 NOTE — Plan of Care (Signed)
Patient being discharged with hospice

## 2021-05-04 NOTE — Discharge Summary (Signed)
Physician Discharge Summary  Patient ID: LENARDO WESTWOOD MRN: [id number] DOB/AGE: Oct 11, 1966 55 y.o.  Admit date: 04/29/2021 Discharge date: 05/04/2021  Admission Diagnoses: -cardiogenic shock  Discharge Diagnoses:  Principal Problem:   Cardiogenic shock ([place]) Active Problems:   Hypertension   Shortness of breath   DM2 (diabetes mellitus, type 2) (HCC)   Hodgkin lymphoma ([place])   Acute on chronic heart failure with reduced ejection fraction and diastolic dysfunction (HCC)   MDD (major depressive disorder), recurrent, severe, with psychosis ([place])   Cardiorenal syndrome with renal failure   Discharged Condition: stable/grim prognosis to hospice  Hospital Course: Per H&P 6/22, pt is 55 yo with dm2, htn, hfref (ef 20-25) due to NICM, hodgkins lymphoma and schizophrenia who presented with cough and sob.   Pt reported progressive sob and orthopnea since 04/26/21. When he presented he was noted to be in acute on chronic hf and admitted to icu for further management. Pt was started on milrinone by HF team and shock improved. He remains end stage HF and after lengthy discussion with HF team and pt/fiancee, they have opted for discharge with hospice care.   Pt was discharged 6/27 appears comfortable sats adequate on supplemental oxygen. Milrinone stopped at d/c and picc line pulled.   Consults: cardiology and palliative and heart failure  Significant Diagnostic Studies: see epic for complete details.   Treatments: cardiac meds: as below, anticoagulation: eliquis, and respiratory therapy: O2  Discharge Exam: Blood pressure 109/81, pulse (!) 135, temperature 97.8 F (36.6 C), temperature source Oral, resp. rate 18, height _0  (1.753 m), weight 99.5 kg, SpO2 94 %. General appearance: alert, cooperative, no distress, and oob in chair Head: Normocephalic, without obvious abnormality, atraumatic Eyes: conjunctivae/corneas clear. PERRL, EOM's intact. Fundi benign. Resp: diminished breath sounds  bilaterally Extremities: edema bilateral   Disposition:    Allergies as of 05/04/2021       Reactions   Ibuprofen Other (See Comments)   Unknown per MAR   Penicillins Rash        Medication List     STOP taking these medications    atorvastatin 10 MG tablet Commonly known as: LIPITOR   dapagliflozin propanediol 10 MG Tabs tablet Commonly known as: FARXIGA   Entresto 24-26 MG Generic drug: sacubitril-valsartan   furosemide 80 MG tablet Commonly known as: LASIX   metoprolol succinate 25 MG 24 hr tablet Commonly known as: TOPROL-XL   traMADol 50 MG tablet Commonly known as: ULTRAM       TAKE these medications    acetaminophen 325 MG tablet Commonly known as: TYLENOL Take 650 mg by mouth every 6 (six) hours as needed for moderate pain.   albuterol (2.5 MG/3ML) 0.083% nebulizer solution Commonly known as: PROVENTIL Take 2.5 mg by nebulization in the morning and at bedtime.   albuterol 108 (90 Base) MCG/ACT inhaler Commonly known as: VENTOLIN HFA Inhale 2 puffs into the lungs 4 (four) times daily as needed for wheezing or shortness of breath.   blood glucose meter kit and supplies Kit Dispense based on patient and insurance preference. Use up to four times daily as directed. (FOR ICD-9 250.00, 250.01).   digoxin 0.125 MG tablet Commonly known as: Lanoxin Take 1 tablet (125 mcg total) by mouth daily.   docusate sodium 100 MG capsule Commonly known as: COLACE Take 1 capsule (100 mg total) by mouth 2 (two) times daily as needed for mild constipation.   Eliquis 5 MG Tabs tablet Generic drug: apixaban Take 5 mg by  mouth 2 (two) times daily.   gabapentin 100 MG capsule Commonly known as: NEURONTIN Take 1 capsule (100 mg total) by mouth at bedtime.   hydrOXYzine 25 MG capsule Commonly known as: VISTARIL Take 25 mg by mouth See admin instructions. Tid as needed for anxiety for up to 10 days   levothyroxine 100 MCG tablet Commonly known as:  SYNTHROID Take 100 mcg by mouth daily before breakfast.   melatonin 3 MG Tabs tablet Take 3 mg by mouth at bedtime.   morphine 15 MG tablet Commonly known as: MSIR Take 1 tablet (15 mg total) by mouth every 2 (two) hours as needed (dyspnea).   pantoprazole 40 MG tablet Commonly known as: PROTONIX Take 40 mg by mouth daily before breakfast.   PEG 3350 17 g Pack Take 1 packet by mouth in the morning and at bedtime. What changed: Another medication with the same name was removed. Continue taking this medication, and follow the directions you see here.   PEN NEEDLES 29GX1/2" 29G X 12MM Misc 1 each by Does not apply route daily.   BD Pen Needle Nano U/F 32G X 4 MM Misc Generic drug: Insulin Pen Needle USE AS DIRECTED.   potassium chloride SA 20 MEQ tablet Commonly known as: KLOR-CON TAKE 2 TABLETS (40 MEQ TOTAL) BY MOUTH DAILY.   risperiDONE 0.5 MG tablet Commonly known as: RISPERDAL Take 1 tablet (0.5 mg total) by mouth at bedtime.   spironolactone 25 MG tablet Commonly known as: Aldactone Take 0.5 tablets (12.5 mg total) by mouth daily.   tamsulosin 0.4 MG Caps capsule Commonly known as: FLOMAX Take 0.4 mg by mouth in the morning.   torsemide 20 MG tablet Commonly known as: Demadex Take 2 tablets (40 mg total) by mouth 2 (two) times daily.       ASK your doctor about these medications    brimonidine 0.2 % ophthalmic solution Commonly known as: ALPHAGAN Place 1 drop into both eyes 2 (two) times daily.   senna 8.6 MG Tabs tablet Commonly known as: SENOKOT Take 2 tablets (17.2 mg total) by mouth 2 (two) times daily.         Signed: Audria Nine 05/04/2021, 11:49 AM

## 2021-05-04 NOTE — TOC Progression Note (Addendum)
Transition of Care [facility]) - Progression Note    Patient Details  Name: Charles [name] MRN: [id number] Date of Birth: 26-Nov-1965  Transition of Care [facility]) CM/SW Contact  Sharin Mons, RN Phone Number: 05/04/2021, 12:29 PM  Clinical Narrative:    D/C summary  and Fl-2  [facility]ed to [name] @ [phone] to Nurse Wells Guiles for reviewing. Updated COVID needed, nurse made aware. NCM to arrange transportation today with [facility] once [facility] ready to receive.  TOC will continue to monitor and follow for needs.   Expected Discharge Plan: Assisted Living Barriers to Discharge: No Barriers Identified  Expected Discharge Plan and Services Expected Discharge Plan: Assisted Living                                               Social Determinants of Health (SDOH) Interventions Food Insecurity Interventions: Intervention Not Indicated, Other (Comment) (Pt. reports getting food stamps already) Financial Strain Interventions: Intervention Not Indicated Housing Interventions: Intervention Not Indicated Transportation Interventions: Financial planner (facility provides transportation)  Readmission Risk Interventions Readmission Risk Prevention Plan 01/06/2021  Medication Review (RN Care Manager) Complete  HRI or Home Care Consult Complete  SW Recovery Care/Counseling Consult Complete  [place] Not Applicable  Some recent data might be hidden

## 2021-06-01 NOTE — Progress Notes (Signed)
                                                                                                                                                             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, intrathoracic lymph nodes  Cancer Staging:  Cancer Staging Hodgkin lymphoma [facility]) Staging form: Hodgkin and Non-Hodgkin Lymphoma, AJCC 8th Edition - Clinical stage from 01/21/2021: Stage I (Hodgkin lymphoma, A - Asymptomatic) - Signed by Eppie Gibson, MD on 01/21/2021 Stage prefix: Initial diagnosis   Intent: Curative  Radiation Treatment Dates: 02/12/2021 through 03/12/2021 Site Technique Total Dose (Gy) Dose per Fx (Gy) Completed Fx Beam Energies  Thorax: Chest_Mediast 3D 36/36 1.8 20/20 6X, 10X, 15X   Narrative: The patient tolerated radiation therapy relatively well although there were suboptimal breaks in his treatment regimen due to mental health crises prompting admissions to the emergency department.  Plan: The patient is scheduled to follow-up with radiation oncology in 32mo. -----------------------------------  SEppie Gibson MD

## 2021-07-04 IMAGING — DX DG CHEST 1V PORT
1 series · 1 of 1 positions shown · non-contrast
Comparison: 04/29/2021.  PET-CT 02/06/2021.  CT 01/13/2021.

CLINICAL DATA: Shortness of breath.

EXAM:
PORTABLE CHEST 1 VIEW

[chest]
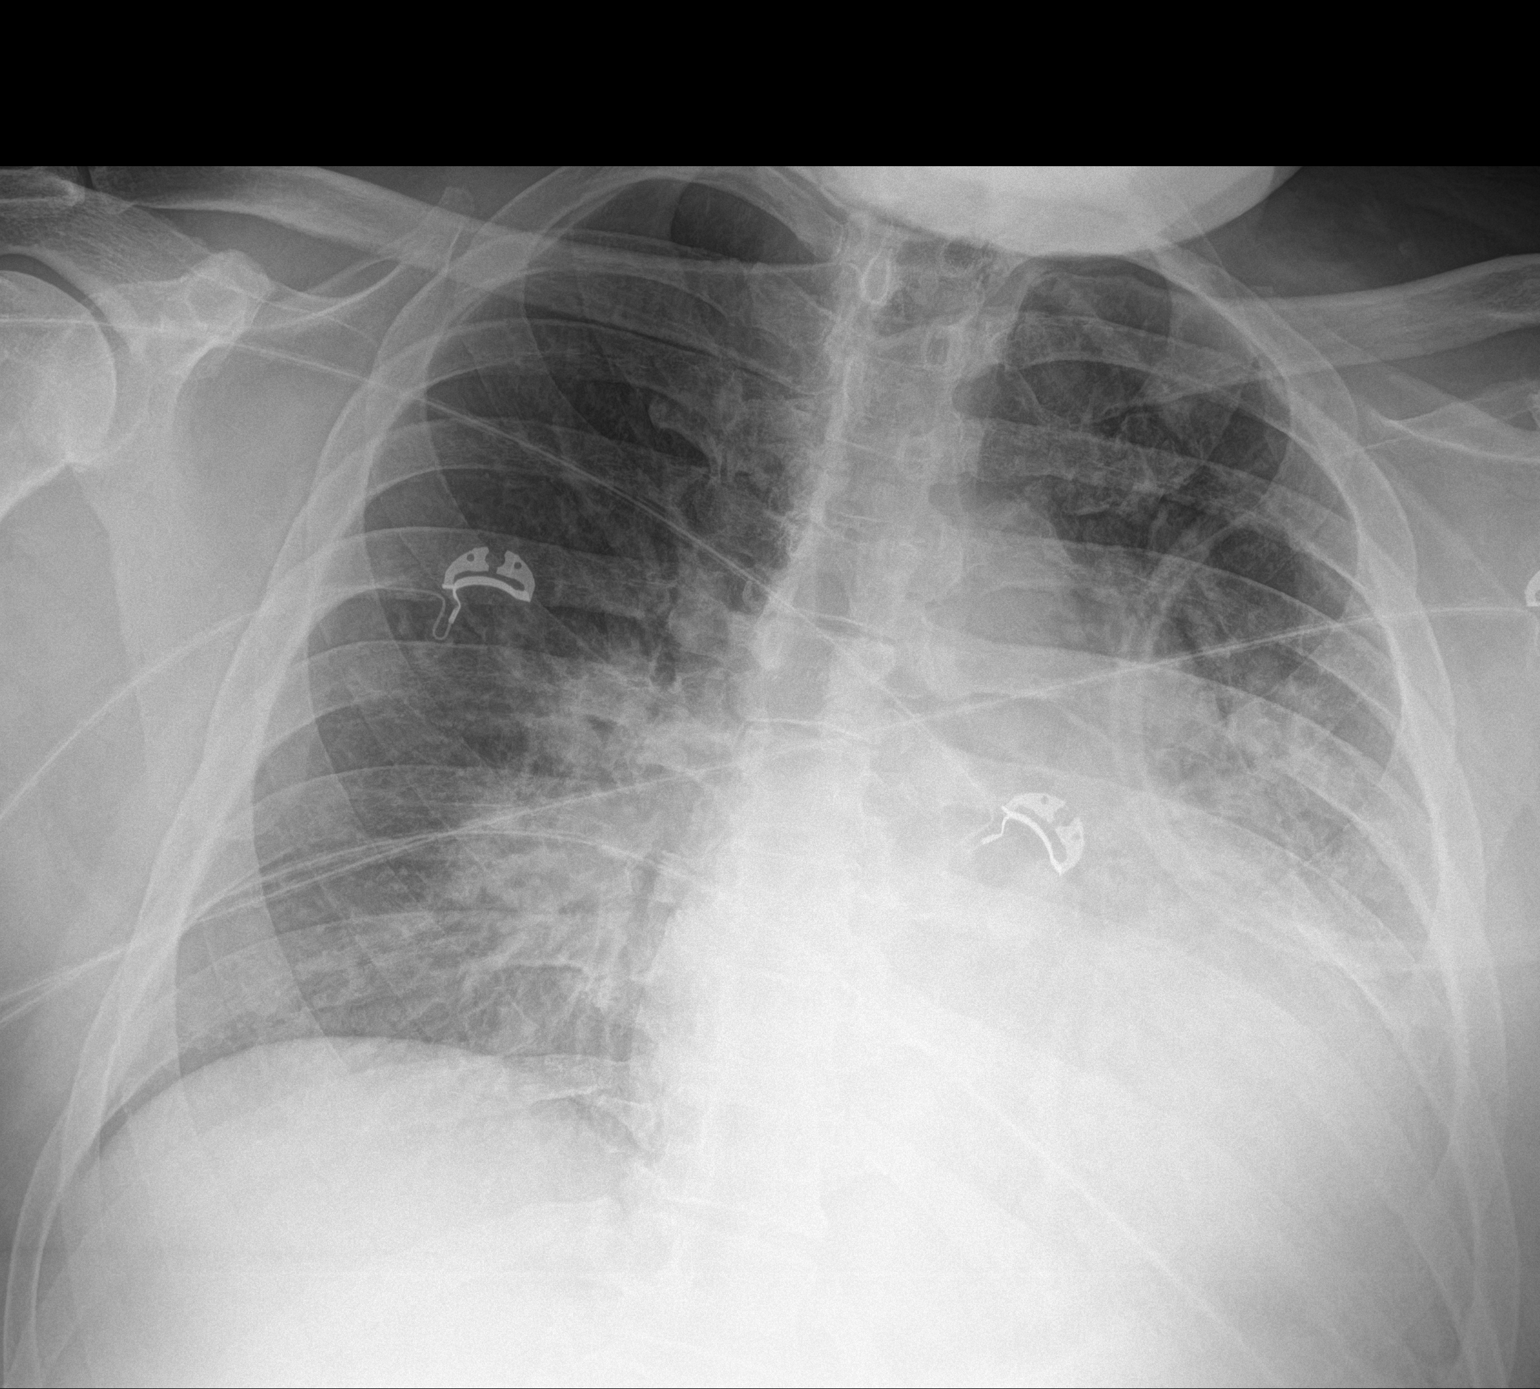

[1 of 1 positions shown; findings below may reference images not displayed]

FINDINGS: Mediastinal fullness again noted consistent previously identified
adenopathy. Cardiomegaly. No pulmonary venous congestion. Low lung
volumes. Bilateral perihilar infiltrates/edema noted. Small left
pleural effusion. No pneumothorax.
IMPRESSION: 1. Mediastinal fullness again noted consistent previously identified
adenopathy.

2.  Cardiomegaly.  No pulmonary venous congestion.

3. Low lung volumes. Bilateral perihilar infiltrates/edema noted.
Small left pleural effusion.

## 2021-07-09 DEATH — deceased
